# Patient Record
Sex: Female | Born: 1935 | Race: White | Hispanic: No | Smoking: Former smoker
Health system: Southern US, Community
[De-identification: ages and names within clinical notes are randomized; demographics above are authoritative.]

## PROBLEM LIST (undated history)

## (undated) ENCOUNTER — Emergency Department (HOSPITAL_COMMUNITY): Admission: EM | Payer: Medicare Other | Source: Home / Self Care

## (undated) DIAGNOSIS — M1990 Unspecified osteoarthritis, unspecified site: Secondary | ICD-10-CM

## (undated) DIAGNOSIS — I514 Myocarditis, unspecified: Secondary | ICD-10-CM

## (undated) DIAGNOSIS — F32A Depression, unspecified: Secondary | ICD-10-CM

## (undated) DIAGNOSIS — R0600 Dyspnea, unspecified: Secondary | ICD-10-CM

## (undated) DIAGNOSIS — E871 Hypo-osmolality and hyponatremia: Secondary | ICD-10-CM

## (undated) DIAGNOSIS — I471 Supraventricular tachycardia, unspecified: Secondary | ICD-10-CM

## (undated) DIAGNOSIS — R269 Unspecified abnormalities of gait and mobility: Principal | ICD-10-CM

## (undated) DIAGNOSIS — C801 Malignant (primary) neoplasm, unspecified: Secondary | ICD-10-CM

## (undated) DIAGNOSIS — N182 Chronic kidney disease, stage 2 (mild): Secondary | ICD-10-CM

## (undated) DIAGNOSIS — IMO0001 Reserved for inherently not codable concepts without codable children: Secondary | ICD-10-CM

## (undated) DIAGNOSIS — I319 Disease of pericardium, unspecified: Secondary | ICD-10-CM

## (undated) DIAGNOSIS — K219 Gastro-esophageal reflux disease without esophagitis: Secondary | ICD-10-CM

## (undated) DIAGNOSIS — I351 Nonrheumatic aortic (valve) insufficiency: Secondary | ICD-10-CM

## (undated) DIAGNOSIS — I10 Essential (primary) hypertension: Secondary | ICD-10-CM

## (undated) DIAGNOSIS — D649 Anemia, unspecified: Secondary | ICD-10-CM

## (undated) HISTORY — PX: VAGINAL HYSTERECTOMY: SUR661

## (undated) HISTORY — DX: Reserved for inherently not codable concepts without codable children: IMO0001

## (undated) HISTORY — DX: Nonrheumatic aortic (valve) insufficiency: I35.1

## (undated) HISTORY — PX: APPENDECTOMY: SHX54

## (undated) HISTORY — DX: Depression, unspecified: F32.A

## (undated) HISTORY — DX: Hypo-osmolality and hyponatremia: E87.1

## (undated) HISTORY — DX: Supraventricular tachycardia, unspecified: I47.10

## (undated) HISTORY — DX: Chronic kidney disease, stage 2 (mild): N18.2

## (undated) HISTORY — DX: Myocarditis, unspecified: I51.4

## (undated) HISTORY — DX: Supraventricular tachycardia: I47.1

## (undated) HISTORY — PX: REVISION TOTAL HIP ARTHROPLASTY: SHX766

## (undated) HISTORY — DX: Unspecified abnormalities of gait and mobility: R26.9

## (undated) HISTORY — PX: OOPHORECTOMY: SHX86

## (undated) HISTORY — DX: Essential (primary) hypertension: I10

## (undated) HISTORY — PX: PELVIC LAPAROSCOPY: SHX162

## (undated) HISTORY — PX: TOTAL KNEE ARTHROPLASTY: SHX125

## (undated) HISTORY — PX: JOINT REPLACEMENT: SHX530

---

## 1990-03-15 HISTORY — PX: BREAST EXCISIONAL BIOPSY: SUR124

## 1997-06-20 ENCOUNTER — Ambulatory Visit (HOSPITAL_COMMUNITY): Admission: RE | Admit: 1997-06-20 | Discharge: 1997-06-20 | Payer: Self-pay | Admitting: Gastroenterology

## 1997-08-19 ENCOUNTER — Other Ambulatory Visit: Admission: RE | Admit: 1997-08-19 | Discharge: 1997-08-19 | Payer: Self-pay | Admitting: *Deleted

## 1997-12-20 ENCOUNTER — Other Ambulatory Visit: Admission: RE | Admit: 1997-12-20 | Discharge: 1997-12-20 | Payer: Self-pay | Admitting: *Deleted

## 1998-06-18 ENCOUNTER — Emergency Department (HOSPITAL_COMMUNITY): Admission: EM | Admit: 1998-06-18 | Discharge: 1998-06-18 | Payer: Self-pay | Admitting: Emergency Medicine

## 1998-08-05 ENCOUNTER — Ambulatory Visit (HOSPITAL_COMMUNITY): Admission: RE | Admit: 1998-08-05 | Discharge: 1998-08-05 | Payer: Self-pay | Admitting: Interventional Cardiology

## 1998-08-05 ENCOUNTER — Encounter: Payer: Self-pay | Admitting: Interventional Cardiology

## 1998-12-22 ENCOUNTER — Other Ambulatory Visit: Admission: RE | Admit: 1998-12-22 | Discharge: 1998-12-22 | Payer: Self-pay | Admitting: *Deleted

## 1999-02-17 ENCOUNTER — Encounter: Payer: Self-pay | Admitting: *Deleted

## 1999-02-17 ENCOUNTER — Encounter: Admission: RE | Admit: 1999-02-17 | Discharge: 1999-02-17 | Payer: Self-pay | Admitting: *Deleted

## 1999-03-19 ENCOUNTER — Inpatient Hospital Stay (HOSPITAL_COMMUNITY): Admission: RE | Admit: 1999-03-19 | Discharge: 1999-03-20 | Payer: Self-pay | Admitting: *Deleted

## 1999-11-24 ENCOUNTER — Encounter: Admission: RE | Admit: 1999-11-24 | Discharge: 1999-11-24 | Payer: Self-pay | Admitting: Surgery

## 1999-11-24 ENCOUNTER — Encounter: Payer: Self-pay | Admitting: Surgery

## 2000-01-19 ENCOUNTER — Other Ambulatory Visit: Admission: RE | Admit: 2000-01-19 | Discharge: 2000-01-19 | Payer: Self-pay | Admitting: *Deleted

## 2000-03-22 ENCOUNTER — Encounter: Admission: RE | Admit: 2000-03-22 | Discharge: 2000-03-22 | Payer: Self-pay | Admitting: *Deleted

## 2000-03-22 ENCOUNTER — Encounter: Payer: Self-pay | Admitting: *Deleted

## 2000-03-23 ENCOUNTER — Encounter: Payer: Self-pay | Admitting: *Deleted

## 2000-03-23 ENCOUNTER — Encounter: Admission: RE | Admit: 2000-03-23 | Discharge: 2000-03-23 | Payer: Self-pay | Admitting: *Deleted

## 2001-03-23 ENCOUNTER — Encounter: Admission: RE | Admit: 2001-03-23 | Discharge: 2001-03-23 | Payer: Self-pay | Admitting: *Deleted

## 2001-03-23 ENCOUNTER — Encounter: Payer: Self-pay | Admitting: *Deleted

## 2001-11-28 ENCOUNTER — Encounter: Payer: Self-pay | Admitting: Surgery

## 2001-11-28 ENCOUNTER — Encounter: Admission: RE | Admit: 2001-11-28 | Discharge: 2001-11-28 | Payer: Self-pay | Admitting: Surgery

## 2001-12-04 ENCOUNTER — Inpatient Hospital Stay (HOSPITAL_COMMUNITY): Admission: RE | Admit: 2001-12-04 | Discharge: 2001-12-06 | Payer: Self-pay | Admitting: Orthopedic Surgery

## 2002-03-28 ENCOUNTER — Encounter: Admission: RE | Admit: 2002-03-28 | Discharge: 2002-03-28 | Payer: Self-pay | Admitting: Obstetrics and Gynecology

## 2002-03-28 ENCOUNTER — Encounter: Payer: Self-pay | Admitting: Obstetrics and Gynecology

## 2002-05-30 ENCOUNTER — Encounter: Admission: RE | Admit: 2002-05-30 | Discharge: 2002-05-30 | Payer: Self-pay | Admitting: Gastroenterology

## 2002-05-30 ENCOUNTER — Encounter: Payer: Self-pay | Admitting: Gastroenterology

## 2002-08-07 ENCOUNTER — Encounter (INDEPENDENT_AMBULATORY_CARE_PROVIDER_SITE_OTHER): Payer: Self-pay | Admitting: Specialist

## 2002-08-07 ENCOUNTER — Ambulatory Visit (HOSPITAL_BASED_OUTPATIENT_CLINIC_OR_DEPARTMENT_OTHER): Admission: RE | Admit: 2002-08-07 | Discharge: 2002-08-07 | Payer: Self-pay | Admitting: Orthopedic Surgery

## 2002-11-20 ENCOUNTER — Encounter: Admission: RE | Admit: 2002-11-20 | Discharge: 2002-11-20 | Payer: Self-pay | Admitting: Surgery

## 2002-11-20 ENCOUNTER — Encounter: Payer: Self-pay | Admitting: Surgery

## 2003-04-18 ENCOUNTER — Encounter: Admission: RE | Admit: 2003-04-18 | Discharge: 2003-04-18 | Payer: Self-pay | Admitting: Gastroenterology

## 2003-11-20 ENCOUNTER — Ambulatory Visit (HOSPITAL_COMMUNITY): Admission: RE | Admit: 2003-11-20 | Discharge: 2003-11-20 | Payer: Self-pay | Admitting: Surgery

## 2004-04-21 ENCOUNTER — Encounter: Admission: RE | Admit: 2004-04-21 | Discharge: 2004-04-21 | Payer: Self-pay | Admitting: Obstetrics and Gynecology

## 2004-04-29 ENCOUNTER — Emergency Department (HOSPITAL_COMMUNITY): Admission: EM | Admit: 2004-04-29 | Discharge: 2004-04-29 | Payer: Self-pay | Admitting: Family Medicine

## 2005-04-22 ENCOUNTER — Encounter: Admission: RE | Admit: 2005-04-22 | Discharge: 2005-04-22 | Payer: Self-pay | Admitting: Obstetrics and Gynecology

## 2005-10-20 ENCOUNTER — Encounter: Admission: RE | Admit: 2005-10-20 | Discharge: 2005-10-20 | Payer: Self-pay | Admitting: Gastroenterology

## 2005-11-26 ENCOUNTER — Encounter: Admission: RE | Admit: 2005-11-26 | Discharge: 2005-11-26 | Payer: Self-pay | Admitting: Surgery

## 2006-03-23 ENCOUNTER — Ambulatory Visit (HOSPITAL_COMMUNITY): Admission: RE | Admit: 2006-03-23 | Discharge: 2006-03-23 | Payer: Self-pay | Admitting: Orthopedic Surgery

## 2006-04-26 ENCOUNTER — Encounter: Admission: RE | Admit: 2006-04-26 | Discharge: 2006-04-26 | Payer: Self-pay | Admitting: Obstetrics and Gynecology

## 2006-07-13 ENCOUNTER — Encounter: Admission: RE | Admit: 2006-07-13 | Discharge: 2006-07-13 | Payer: Self-pay | Admitting: Gastroenterology

## 2007-05-01 ENCOUNTER — Encounter: Admission: RE | Admit: 2007-05-01 | Discharge: 2007-05-01 | Payer: Self-pay | Admitting: Obstetrics and Gynecology

## 2007-08-10 ENCOUNTER — Encounter: Admission: RE | Admit: 2007-08-10 | Discharge: 2007-08-10 | Payer: Self-pay | Admitting: Gastroenterology

## 2007-10-19 ENCOUNTER — Ambulatory Visit: Payer: Self-pay | Admitting: Oncology

## 2007-10-26 LAB — CBC & DIFF AND RETIC
BASO%: 1.3 % (ref 0.0–2.0)
Basophils Absolute: 0.1 10*3/uL (ref 0.0–0.1)
EOS%: 9.4 % — ABNORMAL HIGH (ref 0.0–7.0)
Eosinophils Absolute: 0.6 10*3/uL — ABNORMAL HIGH (ref 0.0–0.5)
HCT: 30.9 % — ABNORMAL LOW (ref 34.8–46.6)
HGB: 10.5 g/dL — ABNORMAL LOW (ref 11.6–15.9)
IRF: 0.34 — ABNORMAL HIGH (ref 0.130–0.330)
LYMPH%: 31.1 % (ref 14.0–48.0)
MCH: 30.6 pg (ref 26.0–34.0)
MCHC: 34 g/dL (ref 32.0–36.0)
MCV: 89.9 fL (ref 81.0–101.0)
MONO#: 0.6 10*3/uL (ref 0.1–0.9)
MONO%: 8.9 % (ref 0.0–13.0)
NEUT#: 3.3 10*3/uL (ref 1.5–6.5)
NEUT%: 49.3 % (ref 39.6–76.8)
Platelets: 207 10*3/uL (ref 145–400)
RBC: 3.44 10*6/uL — ABNORMAL LOW (ref 3.70–5.32)
RDW: 13.8 % (ref 11.3–14.5)
RETIC #: 34.1 10*3/uL (ref 19.7–115.1)
Retic %: 1 % (ref 0.4–2.3)
WBC: 6.8 10*3/uL (ref 3.9–10.0)
lymph#: 2.1 10*3/uL (ref 0.9–3.3)

## 2007-10-26 LAB — CHCC SMEAR

## 2007-10-30 LAB — IRON AND TIBC
%SAT: 22 % (ref 20–55)
Iron: 79 ug/dL (ref 42–145)
TIBC: 364 ug/dL (ref 250–470)
UIBC: 285 ug/dL

## 2007-10-30 LAB — COMPREHENSIVE METABOLIC PANEL
ALT: 24 U/L (ref 0–35)
AST: 27 U/L (ref 0–37)
Albumin: 4.1 g/dL (ref 3.5–5.2)
Alkaline Phosphatase: 55 U/L (ref 39–117)
BUN: 47 mg/dL — ABNORMAL HIGH (ref 6–23)
CO2: 20 mEq/L (ref 19–32)
Calcium: 8.8 mg/dL (ref 8.4–10.5)
Chloride: 105 mEq/L (ref 96–112)
Creatinine, Ser: 1.38 mg/dL — ABNORMAL HIGH (ref 0.40–1.20)
Glucose, Bld: 96 mg/dL (ref 70–99)
Potassium: 5 mEq/L (ref 3.5–5.3)
Sodium: 136 mEq/L (ref 135–145)
Total Bilirubin: 0.3 mg/dL (ref 0.3–1.2)
Total Protein: 7.3 g/dL (ref 6.0–8.3)

## 2007-10-30 LAB — FERRITIN: Ferritin: 50 ng/mL (ref 10–291)

## 2007-10-30 LAB — TRANSFERRIN RECEPTOR, SOLUABLE: Transferrin Receptor, Soluble: 23.4 nmol/L

## 2007-10-30 LAB — LACTATE DEHYDROGENASE: LDH: 196 U/L (ref 94–250)

## 2007-10-30 LAB — FOLATE RBC: RBC Folate: 2182 ng/mL — ABNORMAL HIGH (ref 180–600)

## 2007-10-30 LAB — ERYTHROPOIETIN: Erythropoietin: 9.2 m[IU]/mL (ref 2.6–34.0)

## 2007-10-30 LAB — IMMUNOFIXATION ELECTROPHORESIS
IgA: 185 mg/dL (ref 68–378)
IgG (Immunoglobin G), Serum: 1140 mg/dL (ref 694–1618)
IgM, Serum: 84 mg/dL (ref 60–263)
Total Protein, Serum Electrophoresis: 7.3 g/dL (ref 6.0–8.3)

## 2007-10-30 LAB — SEDIMENTATION RATE: Sed Rate: 14 mm/hr (ref 0–22)

## 2007-10-30 LAB — VITAMIN B12: Vitamin B-12: 1171 pg/mL — ABNORMAL HIGH (ref 211–911)

## 2007-11-01 LAB — CBC WITH DIFFERENTIAL/PLATELET
BASO%: 1.6 % (ref 0.0–2.0)
Basophils Absolute: 0.1 10*3/uL (ref 0.0–0.1)
EOS%: 13.3 % — ABNORMAL HIGH (ref 0.0–7.0)
Eosinophils Absolute: 0.8 10*3/uL — ABNORMAL HIGH (ref 0.0–0.5)
HCT: 32.2 % — ABNORMAL LOW (ref 34.8–46.6)
HGB: 10.9 g/dL — ABNORMAL LOW (ref 11.6–15.9)
LYMPH%: 35.4 % (ref 14.0–48.0)
MCH: 30.6 pg (ref 26.0–34.0)
MCHC: 33.8 g/dL (ref 32.0–36.0)
MCV: 90.3 fL (ref 81.0–101.0)
MONO#: 0.5 10*3/uL (ref 0.1–0.9)
MONO%: 9.2 % (ref 0.0–13.0)
NEUT#: 2.3 10*3/uL (ref 1.5–6.5)
NEUT%: 40.5 % (ref 39.6–76.8)
Platelets: 215 10*3/uL (ref 145–400)
RBC: 3.57 10*6/uL — ABNORMAL LOW (ref 3.70–5.32)
RDW: 13.7 % (ref 11.3–14.5)
WBC: 5.7 10*3/uL (ref 3.9–10.0)
lymph#: 2 10*3/uL (ref 0.9–3.3)

## 2007-11-10 ENCOUNTER — Encounter: Admission: RE | Admit: 2007-11-10 | Discharge: 2007-11-10 | Payer: Self-pay | Admitting: Surgery

## 2007-11-15 LAB — CBC WITH DIFFERENTIAL/PLATELET
BASO%: 1.1 % (ref 0.0–2.0)
Basophils Absolute: 0.1 10*3/uL (ref 0.0–0.1)
EOS%: 8.6 % — ABNORMAL HIGH (ref 0.0–7.0)
Eosinophils Absolute: 0.5 10*3/uL (ref 0.0–0.5)
HCT: 34 % — ABNORMAL LOW (ref 34.8–46.6)
HGB: 11.5 g/dL — ABNORMAL LOW (ref 11.6–15.9)
LYMPH%: 25.1 % (ref 14.0–48.0)
MCH: 29.8 pg (ref 26.0–34.0)
MCHC: 33.7 g/dL (ref 32.0–36.0)
MCV: 88.3 fL (ref 81.0–101.0)
MONO#: 0.5 10*3/uL (ref 0.1–0.9)
MONO%: 8.6 % (ref 0.0–13.0)
NEUT#: 3.2 10*3/uL (ref 1.5–6.5)
NEUT%: 56.6 % (ref 39.6–76.8)
Platelets: 221 10*3/uL (ref 145–400)
RBC: 3.86 10*6/uL (ref 3.70–5.32)
RDW: 13.9 % (ref 11.3–14.5)
WBC: 5.6 10*3/uL (ref 3.9–10.0)
lymph#: 1.4 10*3/uL (ref 0.9–3.3)

## 2007-12-01 ENCOUNTER — Inpatient Hospital Stay (HOSPITAL_COMMUNITY): Admission: RE | Admit: 2007-12-01 | Discharge: 2007-12-04 | Payer: Self-pay | Admitting: Orthopedic Surgery

## 2007-12-06 ENCOUNTER — Ambulatory Visit: Payer: Self-pay | Admitting: Oncology

## 2007-12-13 LAB — CBC WITH DIFFERENTIAL/PLATELET
BASO%: 0.7 % (ref 0.0–2.0)
Basophils Absolute: 0.1 10*3/uL (ref 0.0–0.1)
EOS%: 7.2 % — ABNORMAL HIGH (ref 0.0–7.0)
Eosinophils Absolute: 0.7 10*3/uL — ABNORMAL HIGH (ref 0.0–0.5)
HCT: 30.5 % — ABNORMAL LOW (ref 34.8–46.6)
HGB: 10.2 g/dL — ABNORMAL LOW (ref 11.6–15.9)
LYMPH%: 15 % (ref 14.0–48.0)
MCH: 29.2 pg (ref 26.0–34.0)
MCHC: 33.5 g/dL (ref 32.0–36.0)
MCV: 87.1 fL (ref 81.0–101.0)
MONO#: 0.8 10*3/uL (ref 0.1–0.9)
MONO%: 8.4 % (ref 0.0–13.0)
NEUT#: 6.6 10*3/uL — ABNORMAL HIGH (ref 1.5–6.5)
NEUT%: 68.7 % (ref 39.6–76.8)
Platelets: 444 10*3/uL — ABNORMAL HIGH (ref 145–400)
RBC: 3.5 10*6/uL — ABNORMAL LOW (ref 3.70–5.32)
RDW: 13.9 % (ref 11.3–14.5)
WBC: 9.5 10*3/uL (ref 3.9–10.0)
lymph#: 1.4 10*3/uL (ref 0.9–3.3)

## 2007-12-26 LAB — CBC WITH DIFFERENTIAL/PLATELET
BASO%: 1 % (ref 0.0–2.0)
Basophils Absolute: 0.1 10*3/uL (ref 0.0–0.1)
EOS%: 8.2 % — ABNORMAL HIGH (ref 0.0–7.0)
Eosinophils Absolute: 0.6 10*3/uL — ABNORMAL HIGH (ref 0.0–0.5)
HCT: 33.4 % — ABNORMAL LOW (ref 34.8–46.6)
HGB: 10.8 g/dL — ABNORMAL LOW (ref 11.6–15.9)
LYMPH%: 32.2 % (ref 14.0–48.0)
MCH: 28.7 pg (ref 26.0–34.0)
MCHC: 32.4 g/dL (ref 32.0–36.0)
MCV: 88.5 fL (ref 81.0–101.0)
MONO#: 0.8 10*3/uL (ref 0.1–0.9)
MONO%: 12.1 % (ref 0.0–13.0)
NEUT#: 3.1 10*3/uL (ref 1.5–6.5)
NEUT%: 46.5 % (ref 39.6–76.8)
Platelets: 314 10*3/uL (ref 145–400)
RBC: 3.78 10*6/uL (ref 3.70–5.32)
RDW: 15.3 % — ABNORMAL HIGH (ref 11.3–14.5)
WBC: 6.8 10*3/uL (ref 3.9–10.0)
lymph#: 2.2 10*3/uL (ref 0.9–3.3)

## 2007-12-30 ENCOUNTER — Emergency Department (HOSPITAL_COMMUNITY): Admission: EM | Admit: 2007-12-30 | Discharge: 2007-12-30 | Payer: Self-pay | Admitting: Emergency Medicine

## 2008-01-05 ENCOUNTER — Inpatient Hospital Stay (HOSPITAL_COMMUNITY): Admission: AD | Admit: 2008-01-05 | Discharge: 2008-01-09 | Payer: Self-pay | Admitting: Gastroenterology

## 2008-01-05 ENCOUNTER — Encounter: Admission: RE | Admit: 2008-01-05 | Discharge: 2008-01-05 | Payer: Self-pay | Admitting: Gastroenterology

## 2008-01-14 ENCOUNTER — Ambulatory Visit: Payer: Self-pay | Admitting: Cardiology

## 2008-01-14 ENCOUNTER — Inpatient Hospital Stay (HOSPITAL_COMMUNITY): Admission: EM | Admit: 2008-01-14 | Discharge: 2008-01-15 | Payer: Self-pay | Admitting: Family Medicine

## 2008-05-09 ENCOUNTER — Encounter: Admission: RE | Admit: 2008-05-09 | Discharge: 2008-05-09 | Payer: Self-pay | Admitting: Obstetrics and Gynecology

## 2009-01-04 ENCOUNTER — Encounter: Admission: RE | Admit: 2009-01-04 | Discharge: 2009-01-04 | Payer: Self-pay | Admitting: Orthopedic Surgery

## 2009-02-26 ENCOUNTER — Inpatient Hospital Stay (HOSPITAL_COMMUNITY): Admission: RE | Admit: 2009-02-26 | Discharge: 2009-03-02 | Payer: Self-pay | Admitting: Orthopedic Surgery

## 2009-03-29 ENCOUNTER — Inpatient Hospital Stay (HOSPITAL_COMMUNITY): Admission: EM | Admit: 2009-03-29 | Discharge: 2009-03-30 | Payer: Self-pay | Admitting: Emergency Medicine

## 2009-04-07 ENCOUNTER — Observation Stay (HOSPITAL_COMMUNITY): Admission: EM | Admit: 2009-04-07 | Discharge: 2009-04-08 | Payer: Self-pay | Admitting: Emergency Medicine

## 2009-04-14 ENCOUNTER — Inpatient Hospital Stay (HOSPITAL_COMMUNITY): Admission: RE | Admit: 2009-04-14 | Discharge: 2009-04-17 | Payer: Self-pay | Admitting: Orthopedic Surgery

## 2009-05-15 ENCOUNTER — Encounter: Admission: RE | Admit: 2009-05-15 | Discharge: 2009-05-15 | Payer: Self-pay | Admitting: Orthopedic Surgery

## 2009-05-27 ENCOUNTER — Ambulatory Visit (HOSPITAL_BASED_OUTPATIENT_CLINIC_OR_DEPARTMENT_OTHER): Admission: RE | Admit: 2009-05-27 | Discharge: 2009-05-27 | Payer: Self-pay | Admitting: Orthopedic Surgery

## 2009-06-10 ENCOUNTER — Encounter: Admission: RE | Admit: 2009-06-10 | Discharge: 2009-06-10 | Payer: Self-pay | Admitting: Obstetrics and Gynecology

## 2009-09-19 IMAGING — CT CT ANGIO CHEST
2 of 7 series · 19 of 36 positions shown · IV contrast (APPLIED)
Comparison: 11/20/2002

CLINICAL DATA: Chest pain and shortness of breath

CT ANGIOGRAPHY CHEST
TECHNIQUE: Multidetector CT imaging of the chest using the
standard protocol during bolus administration of intravenous
contrast. Multiplanar reconstructed images including MIPs were
obtained and reviewed to evaluate the vascular anatomy.
Contrast: 80 ml [id number]

[Series 8: pulm embolism 1.0 b25f thins · axial · 0.64mm/px · z∈[-169,+112]mm · 17 of 313 slices shown]
[im 16/313  lung]
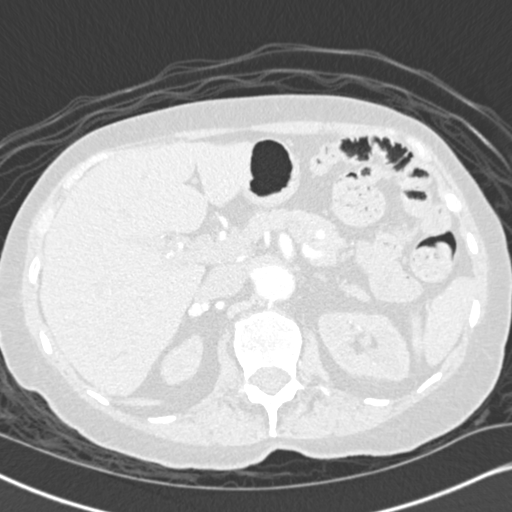
[im 32/313  mediastinal]
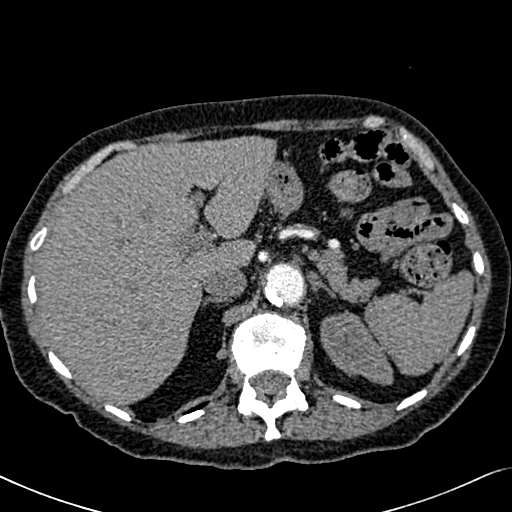
[im 47/313  lung]
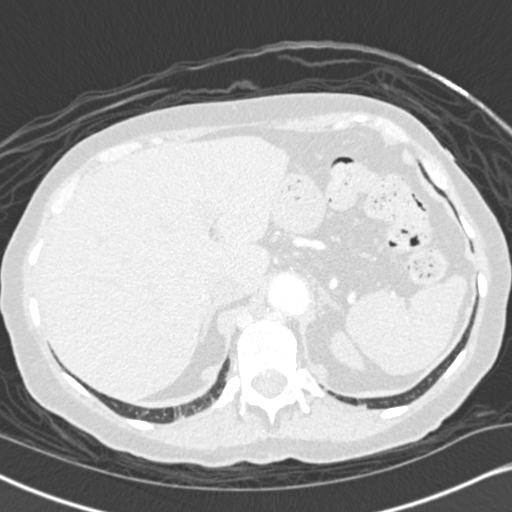
[im 63/313  mediastinal]
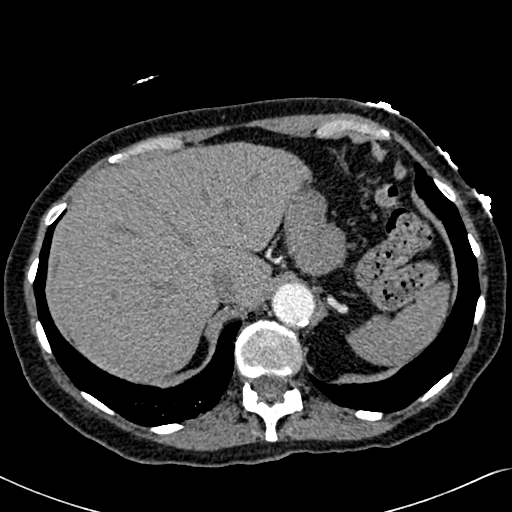
[im 94/313  lung]
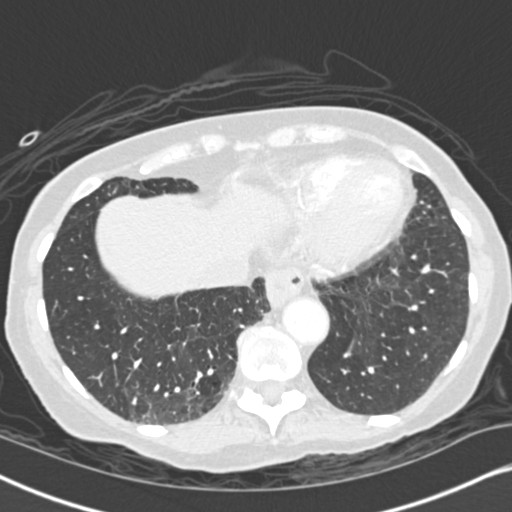
[im 110/313  mediastinal]
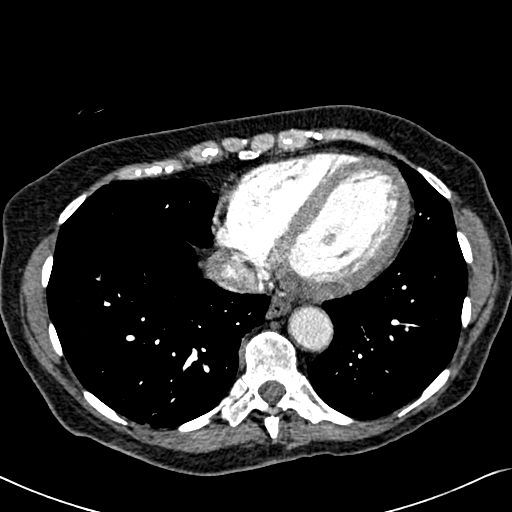
[im 125/313  lung]
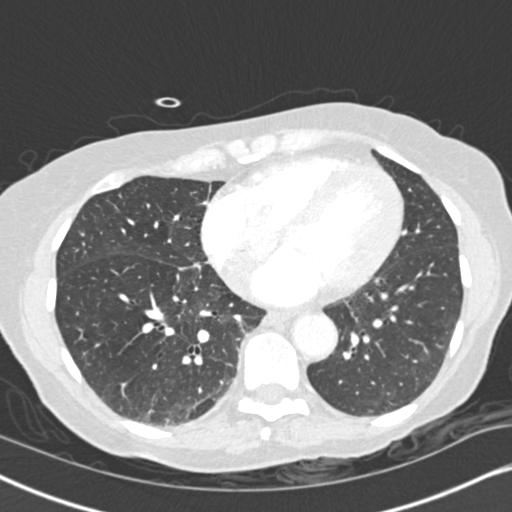
[im 141/313  mediastinal]
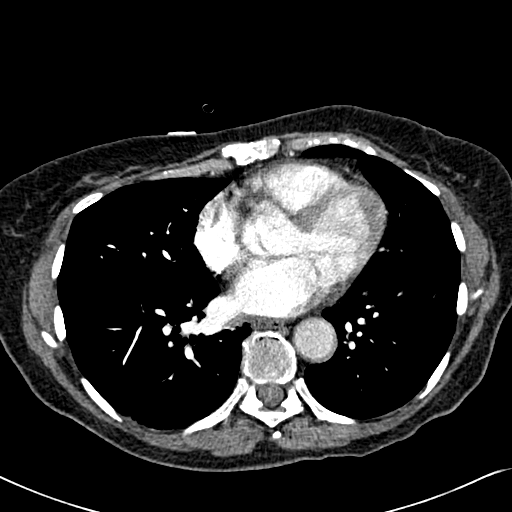
[im 157/313  lung]
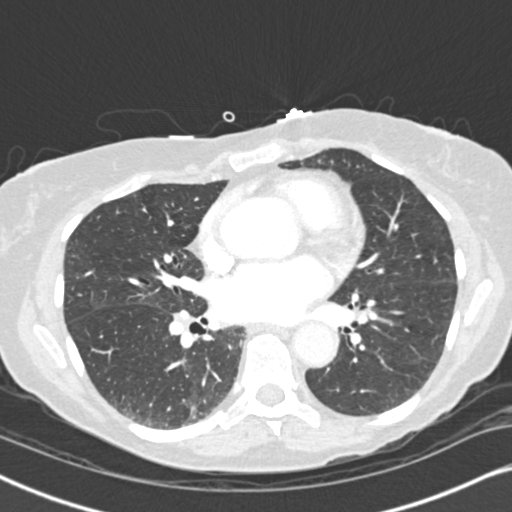
[im 172/313  mediastinal]
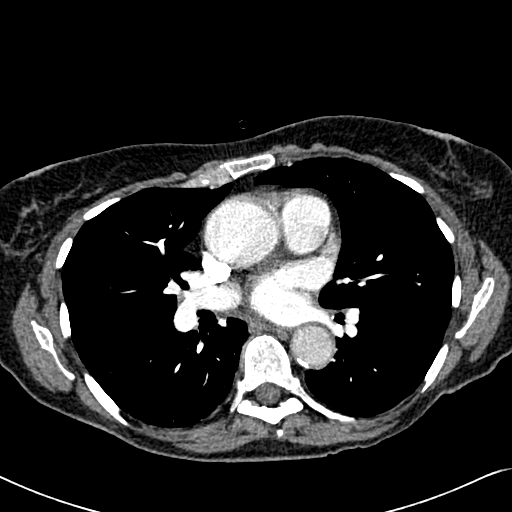
[im 188/313  lung]
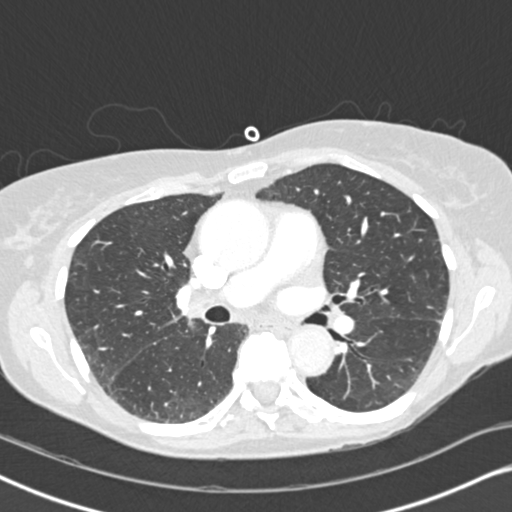
[im 203/313  mediastinal]
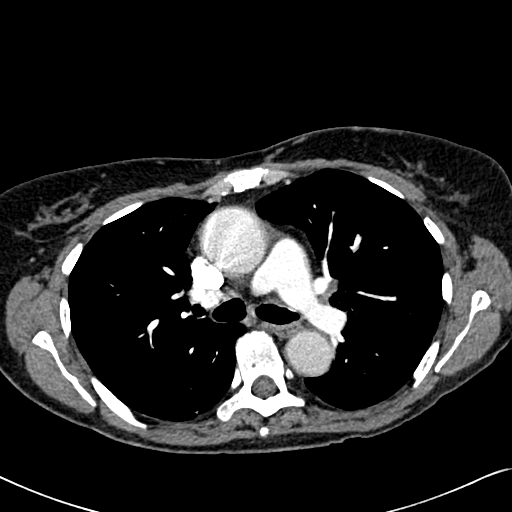
[im 219/313  lung]
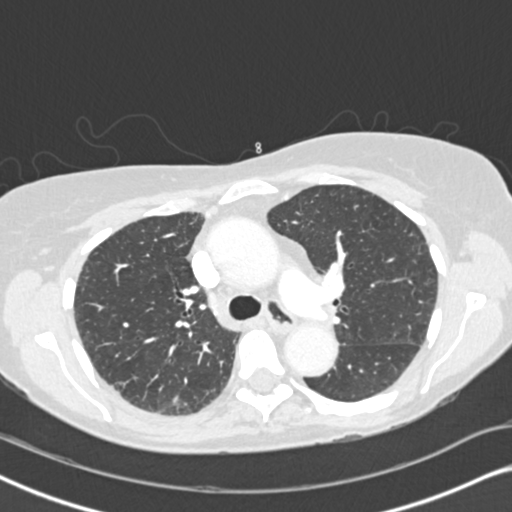
[im 250/313  mediastinal]
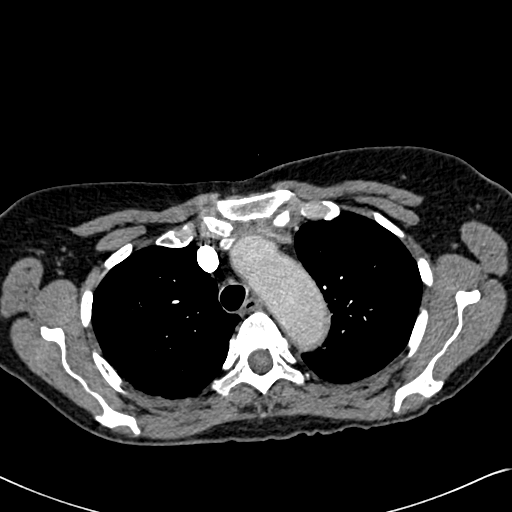
[im 266/313  lung]
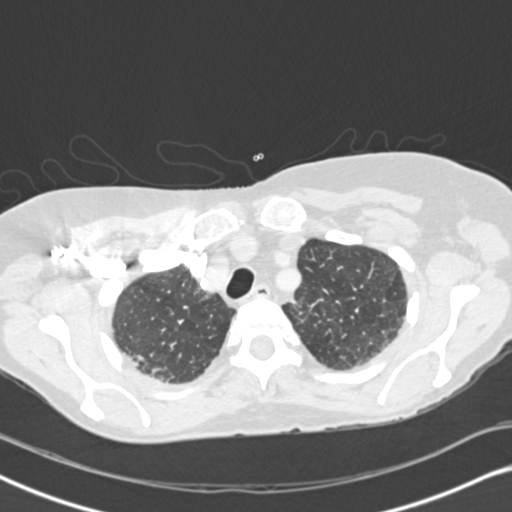
[im 281/313  mediastinal]
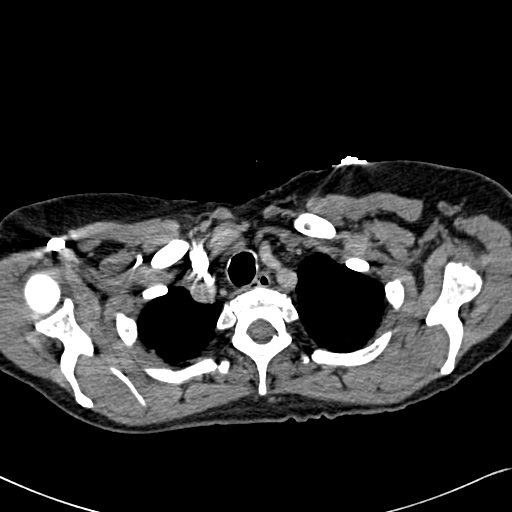
[im 297/313  lung]
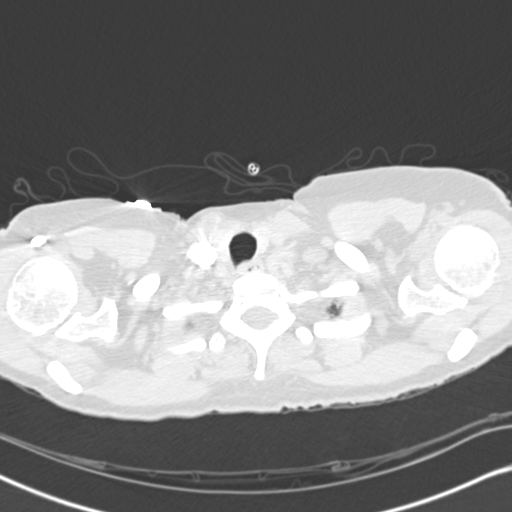

[Series 9: pulm embolism 2.0 spo thins · coronal · 0.69mm/px · 2 of 103 slices shown]
[im 35/103  mediastinal]
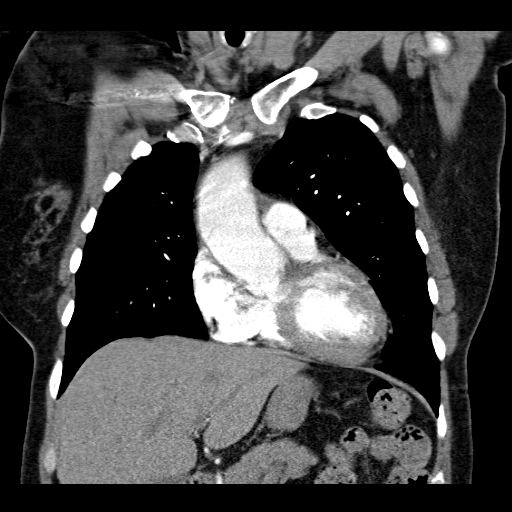
[im 69/103  mediastinal]
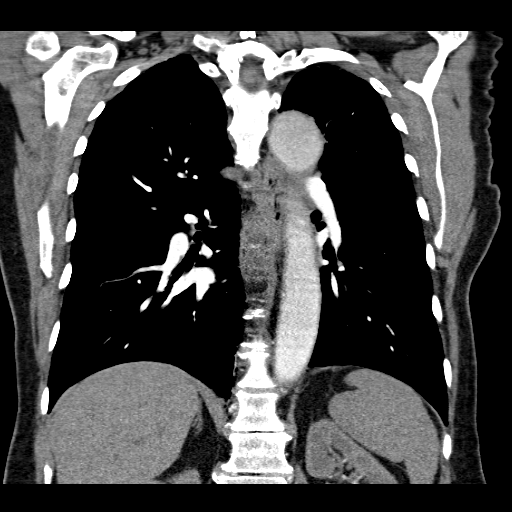

[19 of 36 positions shown; findings below may reference images not displayed]

FINDINGS: The chest wall is unremarkable.  No obvious breast
masses, supraclavicular or axillary adenopathy.  Degenerative
changes throughout the thoracic spine.

The heart is normal in size.  No pericardial effusion.  No
mediastinal or hilar adenopathy.  There is fusiform aneurysmal
dilatation of the ascending aorta with maximal measurements of
x 4.5 cm.  This measured with 4.4 x 4.1 cm on the prior CT scan.
No dissection.  The descending thoracic aorta demonstrates
atherosclerotic calcifications.  The esophagus is grossly normal.
There is a hiatal hernia.

The pulmonary arterial tree is fairly well opacified.  No filling
defects are seen to suggest pulmonary emboli.

Examination of the lung parenchyma demonstrates areas of subpleural
atelectasis.  No infiltrates, edema or effusions.  No worrisome
masses or nodules.

The upper abdomen is unremarkable.
IMPRESSION: 1.  No CT findings for pulmonary embolism.
2.  Fusiform aneurysmal dilatation of the ascending aorta as
discussed above.
3.  Hiatal hernia and mild distal esophageal wall thickening.
4.  No acute pulmonary findings.  There are mild chronic lung
changes and areas of dependent subpleural atelectasis.

## 2009-12-09 ENCOUNTER — Inpatient Hospital Stay (HOSPITAL_COMMUNITY): Admission: RE | Admit: 2009-12-09 | Discharge: 2009-12-13 | Payer: Self-pay | Admitting: Orthopedic Surgery

## 2010-01-06 ENCOUNTER — Ambulatory Visit: Payer: Self-pay | Admitting: Surgery

## 2010-01-06 ENCOUNTER — Encounter: Admission: RE | Admit: 2010-01-06 | Discharge: 2010-01-06 | Payer: Self-pay | Admitting: Surgery

## 2010-04-05 ENCOUNTER — Encounter: Payer: Self-pay | Admitting: Orthopedic Surgery

## 2010-04-15 NOTE — H&P (Signed)
  [name], [name]             ACCOUNT NO.:  [id number]  MEDICAL RECORD NO.:  [id number]           PATIENT TYPE:  LOCATION:                                 FACILITY:  PHYSICIAN:  Ollen Gross, M.D.    DATE OF BIRTH:  [birth year]  DATE OF ADMISSION:  04/14/2009 DATE OF DISCHARGE:                             HISTORY & PHYSICAL   CHIEF COMPLAINT:  Unstable left hip.  HISTORY OF PRESENT ILLNESS:  The patient is 77 year old female with a left total hip done approximately 6 or 7 weeks ago and unfortunately had a posterior dislocation, had to have a closed reduction of the second traumatic dislocation, felt to be some impingement, and I felt at that point she need to be revised, have revision acetabulum.  ALLERGIES:  SULFA, CIPRO, KEFLEX SHELLFISH.  CURRENT MEDICATIONS:  Carvedilol, Diovan, Mobic, hormone replacement, diltiazem, vitamin C, vitamin D, glucosamine chondroitin, multivitamin, vitamin D, fish oil, vitamin E, calcium, aspirin, hydralazine, primrose oil, and PreserVision.  PAST MEDICAL HISTORY:  Macular degeneration, cataracts, history bronchitis, hypertension, heart murmur, AFib, irritable bowel syndrome, slight urinary incontinence, history of blood transfusions, degenerative disk disease, history of bursitis, postmenopausal.  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  FAMILY HISTORY:  Father deceased age 69 with heart attack.  Mother deceased at age 58 complications of stroke.  SOCIAL HISTORY:  Married.  Lives at home, retired.  REVIEW OF SYSTEMS:  GENERAL:  No fevers, chills or night sweats.  NEURO: No seizure, syncope, or paralysis.  RESPIRATORY:  No shortness breath, productive cough  or hemoptysis.  CARDIOVASCULAR:  No chest pain, no orthopnea.  GI:  No nausea, vomiting, diarrhea, constipation.  GU:  No dysuria, hematuria or discharge.  MUSCULOSKELETAL:  Left hip.  PHYSICAL EXAMINATION:  VITAL SIGNS:  Temperature 97.3, pulse 80, respirations 18, blood pressure 148/73. GENERAL:  A 77 year old female, well-developed, in no acute distress. Alert, oriented, cooperative. HEENT:  Normocephalic, atraumatic.  Pupils equal, round, and reactive. EOMs intact. NECK:  Supple. CHEST:  Clear. HEART:  Regular rate and rhythm.  S1, S2 noted. ABDOMEN:  Soft, nontender.  Bowel sounds present. RECTAL/BREASTS/GENITALIA:  Not done, not pertinent to present illness. EXTREMITIES:  Left hip flexion 100, internal rotation about 25, external rotation about 30, abduction 30.  IMPRESSION:  Unstable left total hip.  PLAN:  The patient admitted to [facility] to undergo acetabular revision of the left hip.     [name], P.A.C.   ______________________________ Ollen Gross, M.D. ALP/MEDQ  D:  03/26/2010  T:  03/26/2010  Job:  [id number]  Electronically Signed by Patrica Duel P.A.C. on 04/15/2010 04:54:09 PM Electronically Signed by Ollen Gross M.D. on 04/15/2010 03:38:55 PM

## 2010-05-28 LAB — CBC
HCT: 24.1 % — ABNORMAL LOW (ref 36.0–46.0)
HCT: 28.2 % — ABNORMAL LOW (ref 36.0–46.0)
HCT: 28.4 % — ABNORMAL LOW (ref 36.0–46.0)
HCT: 30.8 % — ABNORMAL LOW (ref 36.0–46.0)
Hemoglobin: 10.4 g/dL — ABNORMAL LOW (ref 12.0–15.0)
Hemoglobin: 8.3 g/dL — ABNORMAL LOW (ref 12.0–15.0)
Hemoglobin: 9.5 g/dL — ABNORMAL LOW (ref 12.0–15.0)
Hemoglobin: 9.7 g/dL — ABNORMAL LOW (ref 12.0–15.0)
MCH: 30.4 pg (ref 26.0–34.0)
MCH: 30.6 pg (ref 26.0–34.0)
MCH: 30.8 pg (ref 26.0–34.0)
MCH: 30.9 pg (ref 26.0–34.0)
MCHC: 33.8 g/dL (ref 30.0–36.0)
MCHC: 34 g/dL (ref 30.0–36.0)
MCHC: 34.3 g/dL (ref 30.0–36.0)
MCHC: 34.5 g/dL (ref 30.0–36.0)
MCV: 89.5 fL (ref 78.0–100.0)
MCV: 89.8 fL (ref 78.0–100.0)
MCV: 89.9 fL (ref 78.0–100.0)
MCV: 90.2 fL (ref 78.0–100.0)
Platelets: 135 10*3/uL — ABNORMAL LOW (ref 150–400)
Platelets: 144 10*3/uL — ABNORMAL LOW (ref 150–400)
Platelets: 146 10*3/uL — ABNORMAL LOW (ref 150–400)
Platelets: 208 10*3/uL (ref 150–400)
RBC: 2.7 MIL/uL — ABNORMAL LOW (ref 3.87–5.11)
RBC: 3.14 MIL/uL — ABNORMAL LOW (ref 3.87–5.11)
RBC: 3.16 MIL/uL — ABNORMAL LOW (ref 3.87–5.11)
RBC: 3.41 MIL/uL — ABNORMAL LOW (ref 3.87–5.11)
RDW: 12.8 % (ref 11.5–15.5)
RDW: 13 % (ref 11.5–15.5)
RDW: 13.3 % (ref 11.5–15.5)
RDW: 13.4 % (ref 11.5–15.5)
WBC: 5.4 10*3/uL (ref 4.0–10.5)
WBC: 6.2 10*3/uL (ref 4.0–10.5)
WBC: 6.3 10*3/uL (ref 4.0–10.5)
WBC: 6.4 10*3/uL (ref 4.0–10.5)

## 2010-05-28 LAB — BASIC METABOLIC PANEL
BUN: 15 mg/dL (ref 6–23)
BUN: 20 mg/dL (ref 6–23)
CO2: 23 mEq/L (ref 19–32)
CO2: 26 mEq/L (ref 19–32)
Calcium: 7.8 mg/dL — ABNORMAL LOW (ref 8.4–10.5)
Calcium: 7.9 mg/dL — ABNORMAL LOW (ref 8.4–10.5)
Chloride: 103 mEq/L (ref 96–112)
Chloride: 104 mEq/L (ref 96–112)
Creatinine, Ser: 0.98 mg/dL (ref 0.4–1.2)
Creatinine, Ser: 1.07 mg/dL (ref 0.4–1.2)
GFR calc Af Amer: >60 mL/min (ref >60)
GFR calc Af Amer: >60 mL/min (ref >60)
GFR calc non Af Amer: 50 mL/min — ABNORMAL LOW (ref >60)
GFR calc non Af Amer: 55 mL/min — ABNORMAL LOW (ref >60)
Glucose, Bld: 102 mg/dL — ABNORMAL HIGH (ref 70–99)
Glucose, Bld: 125 mg/dL — ABNORMAL HIGH (ref 70–99)
Potassium: 3.8 mEq/L (ref 3.5–5.1)
Potassium: 3.9 mEq/L (ref 3.5–5.1)
Sodium: 133 mEq/L — ABNORMAL LOW (ref 135–145)
Sodium: 135 mEq/L (ref 135–145)

## 2010-05-28 LAB — PROTIME-INR
INR: 1.45 (ref 0.00–1.49)
INR: 1.05 (ref 0.00–1.49)
INR: 1.19 (ref 0.00–1.49)
INR: 1.37 (ref 0.00–1.49)
INR: 1.52 — ABNORMAL HIGH (ref 0.00–1.49)
Prothrombin Time: 17.8 seconds — ABNORMAL HIGH (ref 11.6–15.2)
Prothrombin Time: 13.9 seconds (ref 11.6–15.2)
Prothrombin Time: 15.3 seconds — ABNORMAL HIGH (ref 11.6–15.2)
Prothrombin Time: 17.1 seconds — ABNORMAL HIGH (ref 11.6–15.2)
Prothrombin Time: 18.5 seconds — ABNORMAL HIGH (ref 11.6–15.2)

## 2010-05-28 LAB — URINALYSIS, ROUTINE W REFLEX MICROSCOPIC
Bilirubin Urine: NEGATIVE
Glucose, UA: NEGATIVE mg/dL
Hgb urine dipstick: NEGATIVE
Ketones, ur: NEGATIVE mg/dL
Nitrite: NEGATIVE
Protein, ur: NEGATIVE mg/dL
Specific Gravity, Urine: 1.016 (ref 1.005–1.030)
Urobilinogen, UA: 0.2 mg/dL (ref 0.0–1.0)
pH: 5 (ref 5.0–8.0)

## 2010-05-28 LAB — TYPE AND SCREEN
ABO/RH(D): AB POS
Antibody Screen: NEGATIVE

## 2010-05-28 LAB — COMPREHENSIVE METABOLIC PANEL
ALT: 21 U/L (ref 0–35)
AST: 28 U/L (ref 0–37)
Albumin: 3.7 g/dL (ref 3.5–5.2)
Alkaline Phosphatase: 60 U/L (ref 39–117)
BUN: 45 mg/dL — ABNORMAL HIGH (ref 6–23)
CO2: 26 mEq/L (ref 19–32)
Calcium: 8.9 mg/dL (ref 8.4–10.5)
Chloride: 104 mEq/L (ref 96–112)
Creatinine, Ser: 1.08 mg/dL (ref 0.4–1.2)
GFR calc Af Amer: 60 mL/min — ABNORMAL LOW (ref >60)
GFR calc non Af Amer: 49 mL/min — ABNORMAL LOW (ref >60)
Glucose, Bld: 143 mg/dL — ABNORMAL HIGH (ref 70–99)
Potassium: 4.7 mEq/L (ref 3.5–5.1)
Sodium: 137 mEq/L (ref 135–145)
Total Bilirubin: 0.4 mg/dL (ref 0.3–1.2)
Total Protein: 7.1 g/dL (ref 6.0–8.3)

## 2010-05-28 LAB — SURGICAL PCR SCREEN
MRSA, PCR: NEGATIVE
Staphylococcus aureus: POSITIVE — AB

## 2010-05-28 LAB — APTT: aPTT: 30 seconds (ref 24–37)

## 2010-05-28 LAB — PREPARE RBC (CROSSMATCH)

## 2010-05-31 LAB — POCT I-STAT, CHEM 8
BUN: 37 mg/dL — ABNORMAL HIGH (ref 6–23)
Calcium, Ion: 1.11 mmol/L — ABNORMAL LOW (ref 1.12–1.32)
Chloride: 107 mEq/L (ref 96–112)
Creatinine, Ser: 1.2 mg/dL (ref 0.4–1.2)
Glucose, Bld: 103 mg/dL — ABNORMAL HIGH (ref 70–99)
HCT: 27 % — ABNORMAL LOW (ref 36.0–46.0)
Hemoglobin: 9.2 g/dL — ABNORMAL LOW (ref 12.0–15.0)
Potassium: 4.2 mEq/L (ref 3.5–5.1)
Sodium: 136 mEq/L (ref 135–145)
TCO2: 22 mmol/L (ref 0–100)

## 2010-06-01 LAB — CBC
HCT: 25.8 % — ABNORMAL LOW (ref 36.0–46.0)
Hemoglobin: 8.7 g/dL — ABNORMAL LOW (ref 12.0–15.0)
MCHC: 33.7 g/dL (ref 30.0–36.0)
MCV: 90.7 fL (ref 78.0–100.0)
Platelets: 230 10*3/uL (ref 150–400)
RBC: 2.84 MIL/uL — ABNORMAL LOW (ref 3.87–5.11)
RDW: 13.9 % (ref 11.5–15.5)
WBC: 5.8 10*3/uL (ref 4.0–10.5)

## 2010-06-01 LAB — TYPE AND SCREEN
ABO/RH(D): AB POS
Antibody Screen: NEGATIVE

## 2010-06-01 LAB — COMPREHENSIVE METABOLIC PANEL
ALT: 18 U/L (ref 0–35)
AST: 19 U/L (ref 0–37)
Albumin: 3.3 g/dL — ABNORMAL LOW (ref 3.5–5.2)
Alkaline Phosphatase: 57 U/L (ref 39–117)
BUN: 29 mg/dL — ABNORMAL HIGH (ref 6–23)
CO2: 24 mEq/L (ref 19–32)
Calcium: 8.7 mg/dL (ref 8.4–10.5)
Chloride: 99 mEq/L (ref 96–112)
Creatinine, Ser: 1.03 mg/dL (ref 0.4–1.2)
GFR calc Af Amer: >60 mL/min (ref >60)
GFR calc non Af Amer: 52 mL/min — ABNORMAL LOW (ref >60)
Glucose, Bld: 86 mg/dL (ref 70–99)
Potassium: 4.1 mEq/L (ref 3.5–5.1)
Sodium: 133 mEq/L — ABNORMAL LOW (ref 135–145)
Total Bilirubin: 0.8 mg/dL (ref 0.3–1.2)
Total Protein: 7.1 g/dL (ref 6.0–8.3)

## 2010-06-01 LAB — URINALYSIS, ROUTINE W REFLEX MICROSCOPIC
Bilirubin Urine: NEGATIVE
Glucose, UA: NEGATIVE mg/dL
Hgb urine dipstick: NEGATIVE
Ketones, ur: NEGATIVE mg/dL
Nitrite: NEGATIVE
Protein, ur: NEGATIVE mg/dL
Specific Gravity, Urine: 1.014 (ref 1.005–1.030)
Urobilinogen, UA: 0.2 mg/dL (ref 0.0–1.0)
pH: 6 (ref 5.0–8.0)

## 2010-06-01 LAB — BASIC METABOLIC PANEL
BUN: 23 mg/dL (ref 6–23)
CO2: 24 mEq/L (ref 19–32)
Calcium: 8.2 mg/dL — ABNORMAL LOW (ref 8.4–10.5)
Chloride: 104 mEq/L (ref 96–112)
Creatinine, Ser: 0.9 mg/dL (ref 0.4–1.2)
GFR calc Af Amer: >60 mL/min (ref >60)
GFR calc non Af Amer: >60 mL/min (ref >60)
Glucose, Bld: 97 mg/dL (ref 70–99)
Potassium: 3.9 mEq/L (ref 3.5–5.1)
Sodium: 137 mEq/L (ref 135–145)

## 2010-06-01 LAB — POCT I-STAT, CHEM 8
BUN: 43 mg/dL — ABNORMAL HIGH (ref 6–23)
Calcium, Ion: 0.95 mmol/L — ABNORMAL LOW (ref 1.12–1.32)
Chloride: 109 mEq/L (ref 96–112)
Creatinine, Ser: 1.3 mg/dL — ABNORMAL HIGH (ref 0.4–1.2)
Glucose, Bld: 97 mg/dL (ref 70–99)
HCT: 29 % — ABNORMAL LOW (ref 36.0–46.0)
Hemoglobin: 9.9 g/dL — ABNORMAL LOW (ref 12.0–15.0)
Potassium: 5 mEq/L (ref 3.5–5.1)
Sodium: 130 mEq/L — ABNORMAL LOW (ref 135–145)
TCO2: 23 mmol/L (ref 0–100)

## 2010-06-01 LAB — PROTIME-INR
INR: 1.16 (ref 0.00–1.49)
Prothrombin Time: 14.7 seconds (ref 11.6–15.2)

## 2010-06-01 LAB — APTT: aPTT: 32 seconds (ref 24–37)

## 2010-06-01 LAB — PREPARE RBC (CROSSMATCH)

## 2010-06-05 LAB — CBC
HCT: 26.2 % — ABNORMAL LOW (ref 36.0–46.0)
HCT: 29.6 % — ABNORMAL LOW (ref 36.0–46.0)
HCT: 30.4 % — ABNORMAL LOW (ref 36.0–46.0)
Hemoglobin: 10.1 g/dL — ABNORMAL LOW (ref 12.0–15.0)
Hemoglobin: 10.3 g/dL — ABNORMAL LOW (ref 12.0–15.0)
Hemoglobin: 8.6 g/dL — ABNORMAL LOW (ref 12.0–15.0)
MCHC: 32.9 g/dL (ref 30.0–36.0)
MCHC: 33.9 g/dL (ref 30.0–36.0)
MCHC: 34.3 g/dL (ref 30.0–36.0)
MCV: 90 fL (ref 78.0–100.0)
MCV: 90.2 fL (ref 78.0–100.0)
MCV: 91.9 fL (ref 78.0–100.0)
Platelets: 190 10*3/uL (ref 150–400)
Platelets: 202 10*3/uL (ref 150–400)
Platelets: 227 10*3/uL (ref 150–400)
RBC: 2.85 MIL/uL — ABNORMAL LOW (ref 3.87–5.11)
RBC: 3.29 MIL/uL — ABNORMAL LOW (ref 3.87–5.11)
RBC: 3.37 MIL/uL — ABNORMAL LOW (ref 3.87–5.11)
RDW: 14.2 % (ref 11.5–15.5)
RDW: 14.3 % (ref 11.5–15.5)
RDW: 14.5 % (ref 11.5–15.5)
WBC: 5.2 10*3/uL (ref 4.0–10.5)
WBC: 6 10*3/uL (ref 4.0–10.5)
WBC: 6.7 10*3/uL (ref 4.0–10.5)

## 2010-06-05 LAB — BASIC METABOLIC PANEL
BUN: 10 mg/dL (ref 6–23)
BUN: 16 mg/dL (ref 6–23)
BUN: 32 mg/dL — ABNORMAL HIGH (ref 6–23)
CO2: 26 mEq/L (ref 19–32)
CO2: 27 mEq/L (ref 19–32)
CO2: 27 mEq/L (ref 19–32)
Calcium: 8.3 mg/dL — ABNORMAL LOW (ref 8.4–10.5)
Calcium: 8.3 mg/dL — ABNORMAL LOW (ref 8.4–10.5)
Calcium: 9.3 mg/dL (ref 8.4–10.5)
Chloride: 102 mEq/L (ref 96–112)
Chloride: 102 mEq/L (ref 96–112)
Chloride: 105 mEq/L (ref 96–112)
Creatinine, Ser: 0.83 mg/dL (ref 0.4–1.2)
Creatinine, Ser: 0.93 mg/dL (ref 0.4–1.2)
Creatinine, Ser: 1.01 mg/dL (ref 0.4–1.2)
GFR calc Af Amer: >60 mL/min (ref >60)
GFR calc Af Amer: >60 mL/min (ref >60)
GFR calc Af Amer: >60 mL/min (ref >60)
GFR calc non Af Amer: 58 mL/min — ABNORMAL LOW (ref >60)
GFR calc non Af Amer: >60 mL/min (ref >60)
GFR calc non Af Amer: 53 mL/min — ABNORMAL LOW (ref >60)
Glucose, Bld: 105 mg/dL — ABNORMAL HIGH (ref 70–99)
Glucose, Bld: 96 mg/dL (ref 70–99)
Glucose, Bld: 98 mg/dL (ref 70–99)
Potassium: 4.2 mEq/L (ref 3.5–5.1)
Potassium: 4.7 mEq/L (ref 3.5–5.1)
Potassium: 5.2 mEq/L — ABNORMAL HIGH (ref 3.5–5.1)
Sodium: 133 mEq/L — ABNORMAL LOW (ref 135–145)
Sodium: 133 mEq/L — ABNORMAL LOW (ref 135–145)
Sodium: 137 mEq/L (ref 135–145)

## 2010-06-05 LAB — PROTIME-INR
INR: 1.22 (ref 0.00–1.49)
INR: 1.22 (ref 0.00–1.49)
INR: 1.4 (ref 0.00–1.49)
Prothrombin Time: 15.3 seconds — ABNORMAL HIGH (ref 11.6–15.2)
Prothrombin Time: 15.3 seconds — ABNORMAL HIGH (ref 11.6–15.2)
Prothrombin Time: 17 seconds — ABNORMAL HIGH (ref 11.6–15.2)

## 2010-06-05 LAB — POCT HEMOGLOBIN-HEMACUE: Hemoglobin: 11.1 g/dL — ABNORMAL LOW (ref 12.0–15.0)

## 2010-06-05 LAB — PREPARE RBC (CROSSMATCH)

## 2010-06-15 LAB — BASIC METABOLIC PANEL
BUN: 21 mg/dL (ref 6–23)
BUN: 22 mg/dL (ref 6–23)
BUN: 22 mg/dL (ref 6–23)
BUN: 25 mg/dL — ABNORMAL HIGH (ref 6–23)
CO2: 24 mEq/L (ref 19–32)
CO2: 25 mEq/L (ref 19–32)
CO2: 26 mEq/L (ref 19–32)
CO2: 26 mEq/L (ref 19–32)
Calcium: 7.6 mg/dL — ABNORMAL LOW (ref 8.4–10.5)
Calcium: 7.6 mg/dL — ABNORMAL LOW (ref 8.4–10.5)
Calcium: 7.8 mg/dL — ABNORMAL LOW (ref 8.4–10.5)
Calcium: 7.9 mg/dL — ABNORMAL LOW (ref 8.4–10.5)
Chloride: 95 mEq/L — ABNORMAL LOW (ref 96–112)
Chloride: 96 mEq/L (ref 96–112)
Chloride: 99 mEq/L (ref 96–112)
Chloride: 99 mEq/L (ref 96–112)
Creatinine, Ser: 1 mg/dL (ref 0.4–1.2)
Creatinine, Ser: 1.02 mg/dL (ref 0.4–1.2)
Creatinine, Ser: 1.04 mg/dL (ref 0.4–1.2)
Creatinine, Ser: 1.11 mg/dL (ref 0.4–1.2)
GFR calc Af Amer: 58 mL/min — ABNORMAL LOW (ref >60)
GFR calc Af Amer: >60 mL/min (ref >60)
GFR calc Af Amer: >60 mL/min (ref >60)
GFR calc Af Amer: >60 mL/min (ref >60)
GFR calc non Af Amer: 48 mL/min — ABNORMAL LOW (ref >60)
GFR calc non Af Amer: 51 mL/min — ABNORMAL LOW (ref >60)
GFR calc non Af Amer: 53 mL/min — ABNORMAL LOW (ref >60)
GFR calc non Af Amer: 54 mL/min — ABNORMAL LOW (ref >60)
Glucose, Bld: 106 mg/dL — ABNORMAL HIGH (ref 70–99)
Glucose, Bld: 140 mg/dL — ABNORMAL HIGH (ref 70–99)
Glucose, Bld: 150 mg/dL — ABNORMAL HIGH (ref 70–99)
Glucose, Bld: 151 mg/dL — ABNORMAL HIGH (ref 70–99)
Potassium: 3.8 mEq/L (ref 3.5–5.1)
Potassium: 4.1 mEq/L (ref 3.5–5.1)
Potassium: 4.3 mEq/L (ref 3.5–5.1)
Potassium: 4.3 mEq/L (ref 3.5–5.1)
Sodium: 126 mEq/L — ABNORMAL LOW (ref 135–145)
Sodium: 128 mEq/L — ABNORMAL LOW (ref 135–145)
Sodium: 131 mEq/L — ABNORMAL LOW (ref 135–145)
Sodium: 131 mEq/L — ABNORMAL LOW (ref 135–145)

## 2010-06-15 LAB — CBC
HCT: 27.6 % — ABNORMAL LOW (ref 36.0–46.0)
HCT: 28.1 % — ABNORMAL LOW (ref 36.0–46.0)
HCT: 30.6 % — ABNORMAL LOW (ref 36.0–46.0)
Hemoglobin: 10.1 g/dL — ABNORMAL LOW (ref 12.0–15.0)
Hemoglobin: 9.2 g/dL — ABNORMAL LOW (ref 12.0–15.0)
Hemoglobin: 9.2 g/dL — ABNORMAL LOW (ref 12.0–15.0)
MCHC: 32.8 g/dL (ref 30.0–36.0)
MCHC: 33.1 g/dL (ref 30.0–36.0)
MCHC: 33.1 g/dL (ref 30.0–36.0)
MCV: 92.4 fL (ref 78.0–100.0)
MCV: 92.9 fL (ref 78.0–100.0)
MCV: 93.3 fL (ref 78.0–100.0)
Platelets: 190 10*3/uL (ref 150–400)
Platelets: 192 10*3/uL (ref 150–400)
Platelets: 201 10*3/uL (ref 150–400)
RBC: 2.99 MIL/uL — ABNORMAL LOW (ref 3.87–5.11)
RBC: 3.02 MIL/uL — ABNORMAL LOW (ref 3.87–5.11)
RBC: 3.28 MIL/uL — ABNORMAL LOW (ref 3.87–5.11)
RDW: 13.7 % (ref 11.5–15.5)
RDW: 14.4 % (ref 11.5–15.5)
RDW: 14.7 % (ref 11.5–15.5)
WBC: 9 10*3/uL (ref 4.0–10.5)
WBC: 9.3 10*3/uL (ref 4.0–10.5)
WBC: 9.4 10*3/uL (ref 4.0–10.5)

## 2010-06-15 LAB — PROTIME-INR
INR: 1.16 (ref 0.00–1.49)
INR: 1.35 (ref 0.00–1.49)
INR: 1.53 — ABNORMAL HIGH (ref 0.00–1.49)
INR: 1.58 — ABNORMAL HIGH (ref 0.00–1.49)
Prothrombin Time: 14.7 seconds (ref 11.6–15.2)
Prothrombin Time: 16.6 seconds — ABNORMAL HIGH (ref 11.6–15.2)
Prothrombin Time: 18.3 seconds — ABNORMAL HIGH (ref 11.6–15.2)
Prothrombin Time: 18.7 seconds — ABNORMAL HIGH (ref 11.6–15.2)

## 2010-06-16 LAB — COMPREHENSIVE METABOLIC PANEL
ALT: 24 U/L (ref 0–35)
AST: 26 U/L (ref 0–37)
Albumin: 3.5 g/dL (ref 3.5–5.2)
Alkaline Phosphatase: 41 U/L (ref 39–117)
BUN: 31 mg/dL — ABNORMAL HIGH (ref 6–23)
CO2: 29 mEq/L (ref 19–32)
Calcium: 9.5 mg/dL (ref 8.4–10.5)
Chloride: 98 mEq/L (ref 96–112)
Creatinine, Ser: 0.98 mg/dL (ref 0.4–1.2)
GFR calc Af Amer: >60 mL/min (ref >60)
GFR calc non Af Amer: 55 mL/min — ABNORMAL LOW (ref >60)
Glucose, Bld: 116 mg/dL — ABNORMAL HIGH (ref 70–99)
Potassium: 5.7 mEq/L — ABNORMAL HIGH (ref 3.5–5.1)
Sodium: 136 mEq/L (ref 135–145)
Total Bilirubin: 0.7 mg/dL (ref 0.3–1.2)
Total Protein: 7.1 g/dL (ref 6.0–8.3)

## 2010-06-16 LAB — CBC
HCT: 29.5 % — ABNORMAL LOW (ref 36.0–46.0)
Hemoglobin: 9.8 g/dL — ABNORMAL LOW (ref 12.0–15.0)
MCHC: 33 g/dL (ref 30.0–36.0)
MCV: 91.7 fL (ref 78.0–100.0)
Platelets: 276 10*3/uL (ref 150–400)
RBC: 3.22 MIL/uL — ABNORMAL LOW (ref 3.87–5.11)
RDW: 13 % (ref 11.5–15.5)
WBC: 8 10*3/uL (ref 4.0–10.5)

## 2010-06-16 LAB — URINALYSIS, ROUTINE W REFLEX MICROSCOPIC
Bilirubin Urine: NEGATIVE
Glucose, UA: NEGATIVE mg/dL
Hgb urine dipstick: NEGATIVE
Ketones, ur: NEGATIVE mg/dL
Nitrite: NEGATIVE
Protein, ur: NEGATIVE mg/dL
Specific Gravity, Urine: 1.009 (ref 1.005–1.030)
Urobilinogen, UA: 0.2 mg/dL (ref 0.0–1.0)
pH: 7.5 (ref 5.0–8.0)

## 2010-06-16 LAB — TYPE AND SCREEN
ABO/RH(D): AB POS
Antibody Screen: NEGATIVE

## 2010-06-16 LAB — PROTIME-INR
INR: 1.07 (ref 0.00–1.49)
Prothrombin Time: 13.8 seconds (ref 11.6–15.2)

## 2010-06-16 LAB — PREPARE RBC (CROSSMATCH)

## 2010-06-16 LAB — APTT: aPTT: 24 seconds (ref 24–37)

## 2010-07-15 ENCOUNTER — Other Ambulatory Visit: Payer: Self-pay | Admitting: Internal Medicine

## 2010-07-15 DIAGNOSIS — Z1231 Encounter for screening mammogram for malignant neoplasm of breast: Secondary | ICD-10-CM

## 2010-07-21 ENCOUNTER — Ambulatory Visit
Admission: RE | Admit: 2010-07-21 | Discharge: 2010-07-21 | Disposition: A | Discharge status: Home / Self Care | Payer: Medicare Other | Source: Ambulatory Visit | Attending: Internal Medicine | Admitting: Internal Medicine

## 2010-07-21 DIAGNOSIS — Z1231 Encounter for screening mammogram for malignant neoplasm of breast: Secondary | ICD-10-CM

## 2010-07-24 ENCOUNTER — Ambulatory Visit (INDEPENDENT_AMBULATORY_CARE_PROVIDER_SITE_OTHER): Payer: Medicare Other | Admitting: Gynecology

## 2010-07-24 DIAGNOSIS — N952 Postmenopausal atrophic vaginitis: Secondary | ICD-10-CM

## 2010-07-24 DIAGNOSIS — Z79890 Hormone replacement therapy (postmenopausal): Secondary | ICD-10-CM

## 2010-07-28 NOTE — Discharge Summary (Signed)
NAMEELFA, WOOTON             ACCOUNT NO.:  [id number]   MEDICAL RECORD NO.:  [id number]          PATIENT TYPE:  INP   LOCATION:  1602                         FACILITY:  [facility]   PHYSICIAN:  Ollen Gross, M.D.    DATE OF BIRTH:  December 22, 1929   DATE OF ADMISSION:  12/01/2007  DATE OF DISCHARGE:  12/04/2007                               DISCHARGE SUMMARY   ADMITTING DIAGNOSES:  1. Osteoarthritis right knee.  2. Hypertension.  3. Macular degeneration.  4. Thoracic aneurysm, followed by Dr. Laneta Simmers.  5. History of pericarditis.  6. History of anemia.   DISCHARGE DIAGNOSES:  1. Osteoarthritis right knee status post right total knee replacement      arthroplasty.  2. Acute blood loss anemia.  3. Status post transfusion without sequelae.  2.  Hypertension.  4. Macular degeneration.  5. Thoracic aneurysm, followed by Dr. Laneta Simmers.  6. History of pericarditis.  7. History of anemia.  8. Mild postop hyponatremia.   PROCEDURE:  On December 01, 2007, right total knee.  Surgeon Dr.  Lequita Halt.  Assistant [employer].  Spinal anesthesia.   CONSULTATIONS:  None.   BRIEF HISTORY:  Ms. Blankenbeckler is a 77 year old female with end-stage  arthritis, medium patellofemoral compartments of the right knee,  progressive worsening pain and dysfunction, failed operative management,  now presents for total knee arthroplasty.   LABORATORY DATA:  Preop CBC:  Hemoglobin 12.2, hematocrit 34.7, white  cell count 5.6, platelets 268.  Serial CBCs were followed.  Hemoglobin  9.9 postop down to 8.5, last noted 8.1 and 24.8.  She did receive one  unit of blood prior to discharge.  PT/PTT preop 13.5 and 23,  respectively.  INR 1.0.  Serial protimes followed.  PT/INR 21.1 and 1.7.  Chem panel on admission:  Elevated BUN and creatinine preop 41 and 1.3,  respectively.  Remaining Chem panel all within normal limits.  Serial B  mets were followed.  Electrolytes remained within normal limits with  exception of  drop in sodium 137-136, last noted at 131.  Fluids were  discontinued and will allow the sodium came back up on its own.  BUN  dropped from 41-29 and was normal at 28 at time of discharge.  Creatinine dropped from 1.3 down to 1.0, normal at discharge.  Preop UA  negative.  Blood group type AB positive.   EKG, November 24, 2007, sinus rhythm, premature ventricular complexes,  possible left atrial enlargement, septal infarct age undetermined,  premature ventricular complex, septal infarct, new since previous  tracing from Dr. Clovis Riley.   HOSPITAL COURSE:  The patient was admitted to [facility],  tolerated procedure well, later transferred to the recovery room on  orthopedic floor, started on PCA and p.o. analgesic for pain control  following surgery, given 24 hours postop IV antibiotics.  Had a little  bit of nausea and vomiting on the evening of surgery, was feeling a  little bit better by the morning of day #1.  Hemoglobin was 9.9.  Placed  on Coumadin for DVT prophylaxis.  Left PCA going for another day.  Continue the  fluids with the nausea, that was better.  By day #2, nausea  had resolved.  Pain was under better control.  Discontinued the PCA, the  fluids and the Foley.  Sodium was down a little bit down to 131, so we  DC the fluids also and allowed that to titrate back up.  Started on iron  because hemoglobin was down to 8.5.  Rechecked the hemoglobin.  Despite  low hemoglobin.  The patient was doing pretty well with the therapy  walking about 100 feet.  Dressing changes on day #2, incision looked  good.  By day #3, she was going over 150 feet.  Although, the hemoglobin  went down a little bit further.  She is asymptomatic we felt due to the  low hemoglobin.  She was going home.  Wanted to do well with therapy.  We gave her one unit of blood prior to discharge.  She did have a fall,  unfortunately, early morning hours on postop day #3.  She had a little  abrasion  and a skin tear on her back.  Had a little redness there, so we  just put dry gauze and Polysporin on it, told her how to treat this at  home.  She did well with her therapy and was discharged home later that  morning.  The patient was discharged home on December 04, 2007.   DISCHARGE/PLAN:  1. The patient discharged on December 04, 2007.  2. Discharge diagnoses please see above.  3. Discharge medications:  Trinsicon, Percocet, Robaxin, Coumadin.   DISCHARGE INSTRUCTIONS:  1. Diet:  Heart-healthy diet.  2. Activity:  Weightbearing as tolerated right leg, total knee      protocol.  Home health PT/home health nursing.  3. Follow-up 2 weeks.   DISPOSITION:  Home.   CONDITION ON DISCHARGE:  Improved.      Alexzandrew L. Perkins, P.A.C.      Ollen Gross, M.D.  Electronically Signed    ALP/MEDQ  D:  01/19/2008  T:  01/19/2008  Job:  [id number]   cc:   Ollen Gross, M.D.  Fax: [phone]   Tasia Catchings, M.D.  Fax: [phone]   Lyn Records, M.D.  Fax: [phone]   Evelene Croon, M.D.  [address] [place] [address]  [place]

## 2010-07-28 NOTE — H&P (Signed)
NAMEKELLYANNE, Wall NO.:  [id number]   MEDICAL RECORD NO.:  [id number]          PATIENT TYPE:  INP   LOCATION:  1306                         FACILITY:  [facility]   PHYSICIAN:  Tasia Catchings, M.D.   DATE OF BIRTH:  18-Jan-1930   DATE OF ADMISSION:  01/05/2008  DATE OF DISCHARGE:                              HISTORY & PHYSICAL   [name] is a 77 year old patient who approximately 3-1/2 weeks  underwent an elective right knee replacement.  She was okay for the  first two weeks and had in-house rehabilitation and actually attended  one outpatient rehabilitation session; however, Sunday morning early,  she was going to the bathroom and she fell, sustaining a concussion as  well as a large hematoma on her left forehead around her eyes, contusion  of her right ankle and right knee.  She went to the emergency room and  had the appropriate x-rays, nothing was broken.  The CT scan of her head  looked okay.  But since then, she has had a really downhill course.  She  has been unable to do her rehabilitation.  She has been unable to eat  because of mild anorexia and weakness.  She has fallen several more  times, including another time today when she hit her knee.  She lives in  a house which is two stories, and she simply cannot get up and down the  stairs safely.   In addition, I saw her in the office yesterday, and she had a low serum  sodium of about 118, prompting me to discontinue her Diovan/HCT,  assuming that the HCT was the problem.  She also had some difficulty  with urination, mainly getting the stream started, and a little bit of  diarrhea.  The main problem, though, has been the weakness and the  inability to ambulate.  Her elderly husband has been unable to help her.   As a result, she is being admitted for physical therapy, occupational  therapy, and hopefully a transfer to a skilled nursing facility for  further rehab.  During this hospitalization, we will  also correct her  serum sodium and check her out for a urinary tract infection and treat  that appropriately.   Patient has many other chronic medical problems.  They include  intermittent pericarditis, which has not been recently a problem,  ascending aortic aneurysm discovered on a CT scan nine years ago and  followed by Drs. Bartle and Katrinka Blazing, atypical chest pain which is  costochondritis sometimes mistaken for pericarditis.  Anemia of chronic  disease, which she has had since 2005, and which has required occasional  Aranesp, left TMJ syndrome, and other DJD.  She has also had irritable  bowel syndrome and mild hypertension, which has been treated with both  the Coreg 6.25 mg twice daily (also given for her aneurysm) and  Diovan/HCT 160/12.5, which we have recently stopped.   PAST MEDICAL HISTORY:  1. Allergies to SULFITE and SHELLFISH.  2. Smoking:  Quit in 1982 after 15-pack years.  3. Alcohol 1-2 per month.  4. Caffeine:  Adjusting to daily  green tea.   CURRENT MEDICATIONS:  1. Premarin 0.3 mg daily.  2. Diovan/HCT 160/12.5, currently held.  3. Glucosamine with chondroitin sulfate.  4. Coreg 6.25 mg b.i.d.  5. She is no longer on Mobic but does take PreserVision daily.  6. Tylenol for pain.   PREVIOUS SURGERY:  1. Appendectomy.  2. TAH/BSO.  3. Right knee replacement.  4. Ureteral dilatation.  5. Breast biopsy.  6. Squamous tumor removed from right leg.  7. Cystocele and rectocele repair with collagen implant.  8. Bilateral lens replacement.  9. Left partial knee replacement.  10.Left hip repair.   FAMILY HISTORY:  Father died at age 38 of an MI.  Mother died at age 56  from a CVA.  Two brothers alive, one with a CVA and a the other has  prostate cancer.  Three sisters, one who died suddenly in her sleep, and  another who died of a dissecting aneurysm, and one alive and well with  DJD.  Two children alive and well.   SOCIAL HISTORY:  A [place] native who has  lived in [place] since  1964.  Remarried after divorce.  Then widow-hood since 1997.   PHYSICAL EXAMINATION:  Patient has a large ecchymotic area over the left  forehead with hematoma that extends down around both eyes.  HEENT is  otherwise negative.  NECK:  Supple without nodes, bruits, or thyromegaly.  CHEST:  Clear to P&A.  HEART:  Tones normal without extra sounds or murmurs.  EXTREMITIES:  Recent knee replacement, scar from the other knee  replacement, full range of motion, and ecchymosis over the right elbow.   Patient had a CT scan of her head repeated today, which showed no  evidence of a subdural.   IMPRESSION:  1. Recent knee replacement with a fall, now preventing her from doing      rehabilitation.  2. Hyponatremia.  3. Possible urinary tract infection.  4. Anemia of chronic disease.  5. Hypertension.  6. Mild macular degeneration.  7. Degenerative joint disease.      Tasia Catchings, M.D.  Electronically Signed    JW/MEDQ  D:  01/05/2008  T:  01/05/2008  Job:  [id number]   cc:   Ollen Gross, M.D.  Fax: [phone]

## 2010-07-28 NOTE — Discharge Summary (Signed)
[name], [name]             ACCOUNT NO.:  [id number]   MEDICAL RECORD NO.:  [id number]          PATIENT TYPE:  INP   LOCATION:  2041                         FACILITY:  [facility]   PHYSICIAN:  Tasia Catchings, M.D.   DATE OF BIRTH:  10-02-1929   DATE OF ADMISSION:  01/13/2008  DATE OF DISCHARGE:  01/15/2008                               DISCHARGE SUMMARY   DISCHARGE DIAGNOSES:  1. Supraventricular tachycardia documented prior to admission with      brief salvos thereafter.  2. Hypernatremia, resolved.  3. Falling, secondary to hypernatremia.  4. Degenerative joint disease, status post right knee replacement.  5. Hypertension.  6. Thoracic aortic aneurysm.  7. Chronic anemia.   DISCHARGE MEDICATIONS:  1. Diovan 160 q.a.m.  2. Coreg 25 mg b.i.d.  3. Glucosamine as needed.  4. Hydroxyzine 25 mg nightly  5. Premarin 0.3 mg daily.   FOLLOWUP:  The patient is to keep her appointment with me in 3 weeks and  to follow with Dr. Ollen Gross as necessary.  She is also to see Dr.  Katrinka Blazing this coming week in the office.   CONDITION:  Improved.   DIET:  Regular.   ACTIVITY:  Up ad lib.   BRIEF HISTORY:  [name] is a 77 year old white female who was  readmitted to the hospital on Friday with a 45-minute episode of SVT.  She had been hospitalized a week earlier for hypernatremia, which upon  resolution improved her many symptoms including confusion and falling.  From the standpoint of SVT, she had never had documented SVT before.  However, she had had some ectopy noted just before the elective knee  replacement and had been seen by Dr. Katrinka Blazing who felt that she should have  an increase her Coreg, which at that time was at 3.125 b.i.d. and it was  raised to 6.25.  In any case, she developed 45 minutes of chest pressure  and a feeling of heart fluttering, and was seen in the emergency room  and admitted.  Even though, by the time she arrived to the emergency  room, she had converted  back to sinus rhythm.   PHYSICAL EXAMINATION AT THE TIME OF ADMISSION:  Unremarkable, except for  the recent knee surgery.  Lungs were clear.  Heart sounds normal.   LABORATORY DATA:  She had fairly normal CBC and CMET without any  evidence of hypernatremia and a TSH returned normal as well.   She also underwent a CT scan of the chest looking for pulmonary embolus,  which was negative.   HOSPITAL COURSE:  The patient was placed at bedrest and on IV fluids.  She was also set up with a monitor, which revealed a couple of small  episodes less than 10 seconds in duration of SVT as well.  Her Coreg was  increased to 12.5 mg twice a day and she was seen just prior to  discharge by Dr. Katrinka Blazing who recommended we further raise it to 25 mg  b.i.d.   DISCHARGE PLAN:  As above.      Tasia Catchings, M.D.  Electronically Signed  JW/MEDQ  D:  01/15/2008  T:  01/16/2008  Job:  [id number]

## 2010-07-28 NOTE — H&P (Signed)
[name], [name]             ACCOUNT NO.:  [id number]   MEDICAL RECORD NO.:  [id number]          PATIENT TYPE:  INP   LOCATION:  2041                         FACILITY:  [facility]   PHYSICIAN:  Darryl D. Prime, MD    DATE OF BIRTH:  07-10-1929   DATE OF ADMISSION:  01/13/2008  DATE OF DISCHARGE:                              HISTORY & PHYSICAL   CODE STATUS:  Full code.   PRIMARY CARE PHYSICIAN:  Tasia Catchings, M.D.   TOTAL ENCOUNTER TIME:  Approximately 55 minutes.   CHIEF COMPLAINT:  Chest fullness and palpitations.   The patient was the historian; she is a good historian.   HISTORY OF PRESENT ILLNESS:  [name] is a 77 year old female with  history of a recent elective right knee replacement on December 01, 2007.  On January 05, 2008, she fell, suffering a significant hematoma  on the left forehead, she did suffer a concussion with a contusion to  her ankle and knee; history of hypertension; she has a history of a  thoracic aortic aneurysm, history of recent hyponatremia and  deconditioning who presents with chest fullness and tachy palpitations.  She notes the tachy palpitations began tonight and lasted around 45  minutes then went away apparently spontaneously.  She had an episode of  vomiting but she denies any chest pain, recent fever.  She denies any  recent decrease in appetite.  The patient was brought in by EMS and the  strip shows that she was in apparent supraventricular tachycardia in the  range of 160.   PAST MEDICAL HISTORY:  As above.  She has a history of recent anorexia,  history of intermittent pericarditis and a history of atypical chest  pain costochondritis, history of anemia of chronic disease, history of  TMJ (temporomandibular joint) syndrome, history of degenerative joint  disease, history of irritable bowel syndrome, history of left partial  knee replacement, history of left hip repair, history of total abdominal  hysterectomy and bilateral  salpingo-oophorectomy, status post  appendectomy, history of cystocele and rectocele, history of squamous  cell carcinoma since removed, history of bilateral lens implants.   MEDICATIONS:  1. She is on Diovan 160 mg in the morning.  2. Coreg 6.25 mg twice a day.  3. Glucosamine p.r.n.  4. Hydroxyzine 25 q.h.s.  5. Premarin 0.3 mg daily.   ALLERGIES:  KEFLEX, SULFA, CEPHALOSPORINS, IODINE, CIPROFLOXACIN.   FAMILY HISTORY:  The patient's family history, father died of a  myocardial infarction at the age of 21.  Mother died of a stroke at the  age of 10.  She has one sister that died of dissecting aneurysm.   SOCIAL HISTORY:  She is widowed since 18.  No tobacco, alcohol or drug  use.   REVIEW OF SYSTEMS:  A 14 point review of systems was negative unless  stated above.   PHYSICAL EXAMINATION:  VITAL SIGNS:  Temperature is 98.2 with a blood  pressure of 184/95, respiratory rate of 18, sats are 97% on room air  with a pulse of 80.  GENERAL:  She is a female who looks younger  than her stated age lying  flat in bed in no acute distress.  HEENT:  Normocephalic, atraumatic with pupils equal, round and reactive  to light with extraocular movements intact.  Oropharynx shows no  posterior lesions.  It is dry.  NECK:  Supple with no lymphadenopathy or thyromegaly.  No jugular venous  distention.  LUNGS:  Clear to auscultation bilaterally.  CARDIOVASCULAR:  Regular rhythm and rate with no murmurs, rubs or  gallops.  Normal S1, S2, no S3 or S4.  ABDOMEN:  Soft, nontender, nondistended with no hepatosplenomegaly.  EXTREMITIES:  Show no clubbing, cyanosis or edema.  NEUROLOGICAL:  Alert and oriented x4 with cranial nerves II-XII grossly  intact.  Strength and sensation were grossly intact.   LABORATORY DATA:  Shows on iSTAT a CO2 of 24, hemoglobin 10.5,  hematocrit of 31, sodium of 128, potassium of 5.1, chloride 99, glucose  98, BUN 5.0, creatinine 1.4, baseline creatinine is in the  range of 0.8.  White count of 7.4 with a hemoglobin of 9.7, hematocrit 29.4, platelets  362, segs of 53%.  D-dimer 1.57.  Cardiac markers x2 were negative.  She  had a CT of the chest which was negative for pulmonary embolus.  She did  have an ascending aorta fusiform aortic aneurysm with 4.7 x 4.5 which is  mildly increased compared to 4.4 x 4.1 on prior exam in centimeters.  No  dissection.  She had a hiatal hernia and mild distal esophageal wall  thickening.  Chest x-ray showed chronic lung changes, nothing acute.  EKG shows normal sinus rhythm with a rate of 70 beats per minute.  QT  correct at 388.   ASSESSMENT AND PLAN:  This is a patient with a history of significant  deconditioning recently after a right knee replacement.  She now  presents with SVT (supraventricular tachycardia) and acute renal  insufficiency.  She is likely significantly hypovolemic.  She has no  pulmonary embolism on CT.  The patient will be admitted to rule out  acute coronary syndrome and we will check a TSH and a magnesium.  She  will be given low dose aspirin in the meantime.  We will increase her  Coreg to prevent any more episodes of possible SVT to 12.5 mg twice a  day.  She may benefit from the addition of calcium channel blockers or  if the SVT persists she may benefit from possible ablation at some  point.      Darryl D. Prime, MD  Electronically Signed     DDP/MEDQ  D:  01/14/2008  T:  01/14/2008  Job:  [id number]

## 2010-07-28 NOTE — Assessment & Plan Note (Signed)
OFFICE VISIT   [name], [name]  DOB:  04/06/1929                                        January 06, 2010  CHART #:  [id number]   HISTORY:  The patient returned to my office today for followup of an  ascending aortic aneurysm.  I last saw her in September 2007, at which  time the MR angiogram showed no change in the 4.5-cm ascending aortic  aneurysm.  She did have a followup study done in 2009 but did not return  to the office at that time.  The study in 2009 showed a maximum diameter  of about 4.7 cm.  She said that over the past year she has had no chest  pain or pressure.  Her only problem has been with her left hip as she  has undergone left hip replaced in 2010 and subsequently had two  dislocations in the first month or so postoperatively and had to undergo  two further revisions most recently several weeks ago.  She said she is  now walking without a cane and has no pain in her hip and seems to be  doing well.   PHYSICAL EXAMINATION:  Today her blood pressure is 148/80, pulse is 68  and regular, respiratory rate is 16 and unlabored.  Oxygen saturation on  room air is 96%.  She looks well.  Cardiac exam shows a regular rate and  rhythm with a 1/6 diastolic murmur of aortic insufficiency.  Her lungs  are clear.   A followup MR angiogram of the chest shows that the maximum diameter of  her ascending aortic aneurysm is at 4.6 cm which is essentially  unchanged over the past 4 years.   IMPRESSION:  The patient has a stable ascending aortic aneurysm on the  maximum diameter at this time of 4.6 cm.  Her aneurysm was diagnosed in  1999, at which time the diameter was about 4 cm.  It has slowly enlarged  overtime and the diameter is remained about 4.6 cm over the past 4  years.  She is now 77 years old but is still in fairly good shape, still  very active despite her left hip problems.  I have recommended that we  repeat a followup MR angiogram in about  2 years to reassess this.  She  did have a family history of aortic dissection with two sisters dying of  suspected aortic dissection in their 1s.  She does have a slight murmur  of aortic insufficiency, which I did not note on my previous exam when I  saw her 4 years ago.  She said she has not had an echocardiogram but is  following up with Dr. Verdis Prime on a yearly basis.  She probably  should have an echocardiogram done at her next visit to assess her  aortic valve as a baseline.  We again reviewed the importance of good  blood pressure control and she said that she frequently checks her blood  pressure at home and is usually running in the 120s.  She said her blood  pressure occasionally drops down to high 90s and she feels a little weak  and dizzy.  I discussed the signs and symptoms of aortic dissection with  her.  She will let me know immediately if she develops any of those  signs  or symptoms.  I think her risk for aortic dissection is certainly  increased due to her strong family history, but her aneurysm appears  stable at 4.6 cm, and the risk of surgery at this time given her age  outweighs the risk of aortic dissection.  I will plan to see her back in  2 years with a repeat MR angiogram of the chest.   Evelene Croon, M.D.  Electronically Signed   BB/MEDQ  D:  01/06/2010  T:  01/07/2010  Job:  [id number]   cc:   Deirdre Peer. Polite, M.D.

## 2010-07-28 NOTE — Discharge Summary (Signed)
[name], [name]             ACCOUNT NO.:  [id number]   MEDICAL RECORD NO.:  [id number]          PATIENT TYPE:  INP   LOCATION:  1306                         FACILITY:  [facility]   PHYSICIAN:  Tasia Catchings, M.D.   DATE OF BIRTH:  19-Jul-1929   DATE OF ADMISSION:  01/05/2008  DATE OF DISCHARGE:  01/09/2008                               DISCHARGE SUMMARY   DISCHARGE DIAGNOSES:  1. Hyponatremia.  2. Falling secondary to hyponatremia with contusion to the left      forehead, right elbow, and right knee.  3. Degenerative joint disease status post right knee replacement.  4. Hypertension.  5. Thoracic aortic aneurysm.   DISCHARGE MEDICATIONS:  1. Diovan 160 mg q.a.m.  2. Coreg 625 b.i.d..  3. Glucosamine as needed.  4. Hydroxyzine 25 mg q.h.s.  5. Premarin 0.3 mg daily.   FOLLOW UP:  The patient is to see me in four weeks but to call me in one  week with her blood pressures since she is going to monitor them at  home.  She is also to see Dr. Ollen Gross this Friday and to have home  health physical therapy as well as center physical therapy to help her  with ambulation.   CONDITION:  Improved.   DIET:  Regular.   ACTIVITY:  Activity is as described by physical therapy.   BRIEF HISTORY:  [name] is a 77 year old female who, about four  weeks ago, underwent elective right knee replacement and did well for  about two weeks but then fell about one week prior to admission.  This  fall was a significant fall, bruised her face, her elbow, and her knee,  and resulted in an emergency room visit which looked okay in terms of x-  ray.  However, progressively thereafter, she became more confused and  also began to fall more.  Accordingly, we saw her in the office and  found that her serum sodium was down to 117.  Presumably this was due to  her hydrochlorothiazide which she was taking for her blood pressure,  however, I could not rule out that she had developed a subdural and  maybe increased intracranial pressure, so a repeat CT scan was obtained  and that fortunately did not reveal a subdural.  As a result of her  inability to really ambulate at home and to manage going upstairs  because her bedrooms were above the first floor as was her bathroom, we  admitted her to the hospital.   Physical exam was negative with the following exceptions.  Blood  pressure was about 150/90.  She had a very large contusion over her left  eye with hematoma surrounding both eyes.  There was a small contusion on  her right elbow and on her right knee.  Neurologic exam revealed a wide-  based gait but no pathological reflexes.   LABORATORY DATA:  The patient's her initial serum sodium was 119 and  that improved to 132 by the time of discharge.  Urinalysis, CBC, and  CMET were otherwise normal with the exception of her chronic anemia  which was  at her baseline of around 9.5-10 hemoglobin.  X-ray of her  knee was unremarkable.  Chest x-ray was unremarkable and, as mentioned,  her CT scan of her head was unremarkable except for an old infarct.   HOSPITAL COURSE:  The patient was placed at bedrest and on IV fluids and  seen by OT and PT.  With IV fluids alone and with the cessation of her  hydrochlorothiazide, her serum sodium came up and all her other  symptoms, including confusion and instability of gait, improved, and so  did her appetite, urination, etc.  As a result, when she was finally  evaluated by OT and PT, they felt she could successfully be treated at  home without going to a skilled nursing facility.  Therefore, she is  being discharged on the above medications.      Tasia Catchings, M.D.  Electronically Signed     JW/MEDQ  D:  01/09/2008  T:  01/09/2008  Job:  [id number]

## 2010-07-28 NOTE — H&P (Signed)
NAMEMELEK, [name]             ACCOUNT NO.:  [id number]   MEDICAL RECORD NO.:  [id number]          PATIENT TYPE:  INP   LOCATION:  NA                           FACILITY:  [facility]   PHYSICIAN:  Ollen Gross, M.D.    DATE OF BIRTH:  26-Sep-1929   DATE OF ADMISSION:  DATE OF DISCHARGE:                              HISTORY & PHYSICAL   CHIEF COMPLAINT:  Right knee pain.   HISTORY OF PRESENT ILLNESS:  The patient is a 77 year old female who has  seen by Dr. Lequita Halt for ongoing knee pain.  She is known to have medium  patellofemoral arthritis in the right knee.  She has been treated  conservatively in the past for her knees and has undergone injections.  Unfortunately the knee continues to cause problem, getting out, and  causing pain.  She has been admitted for undergoing surgical  intervention.  Risks and benefits have been discussed.  She elects to  proceed with surgery.   ALLERGIES:  SULFA medications cause swelling. KEFLEX cause blurred  vision.  CIPRO causes hives and rash.   CURRENT MEDICATIONS:  1. Coreg.  2. Mobic.  3. Diovan HCT.  4. Premarin.  5. Aspirin.  6. Ibuprofen.  7. PreserVision.  8. Multivitamin.  9. Vitamin C.  10.Vitamin D.  11.Fish oil.  12.B complex.   PAST MEDICAL HISTORY:  1. Hypertension.  2. Macular degeneration.  3. Thoracic aneurysm which is followed by Dr. Evelene Croon.  4. History of pericarditis.  5. History of anemia.   PAST SURGICAL HISTORY:  1. Surgery for right leg compound fracture at age 34.  2. Pin removal at age 42.  3. Spur excision right wrist.  4. Right thumb surgery.  5. Two ovarian cystectomies.  6. Hysterectomy.  7. Bladder repair.   FAMILY HISTORY:  Father deceased age 11 secondary MI.  Mother deceased  at 41 with history of stroke.   SOCIAL HISTORY:  Denies the use tobacco.  Seldom intake of alcohol.  Two  children.   REVIEW OF SYSTEMS:  GENERAL:  No fevers, chills or night sweats.  NEURO:  No seizures, syncope,  or paralysis.  RESPIRATORY: No shortness of  breath, cough or hemoptysis.  CARDIOVASCULAR:  No chest pain, GI: No  nausea, vomiting, diarrhea, or constipation.  GU: No dysuria or  hematuria.  Musculoskeletal: Right knee.   PHYSICAL EXAMINATION:  VITAL SIGNS: Pulse 70, respirations 12, blood  pressure 146/82.  GENERAL: This 77 year old white female well-nourished, well-developed,  in no acute distress.  Alert, oriented, cooperative, and pleasant.  HEENT: Normocephalic, atraumatic.  Pupils are round and reactive.  Oropharynx clear.  EOMs intact.  NECK:  Supple.  CHEST: Clear to anterior and posterior chest walls.  No rhonchi, rales  or wheezing.  HEART: Regular rate and rhythm.  No murmur.  S1 and S2 noted.  ABDOMEN: Soft and nontender.  Bowel sounds present.  BREASTS/GENITALIA:  Not done.  EXTREMITIES:  Right knee has slight posterior swelling in the knee.  No  instability.  Marked crepitus noted on passive range of motion.   IMPRESSION:  Osteoarthritis right knee.  PLAN:  The patient is admitted to [facility] to undergo a  right total knee replacement arthroplasty.  Surgery will be performed by  Dr. Ollen Gross.      Alexzandrew L. Perkins, P.A.C.      Ollen Gross, M.D.  Electronically Signed    ALP/MEDQ  D:  11/30/2007  T:  12/03/2007  Job:  [id number]   cc:   Tasia Catchings, M.D.  Fax: [phone]   Garnette Scheuermann, MD   Evelene Croon, M.D.  [address] [place] [address]  [place]   ?

## 2010-07-28 NOTE — Op Note (Signed)
NAMEABRI, VACCA             ACCOUNT NO.:  [id number]   MEDICAL RECORD NO.:  [id number]          PATIENT TYPE:  INP   LOCATION:  0004                         FACILITY:  [facility]   PHYSICIAN:  Ollen Gross, M.D.    DATE OF BIRTH:  [birth year]   DATE OF PROCEDURE:  12/01/2007  DATE OF DISCHARGE:                               OPERATIVE REPORT   PREOPERATIVE DIAGNOSIS:  Osteoarthritis, right knee.   POSTOPERATIVE DIAGNOSIS:  Osteoarthritis, right knee.   PROCEDURE:  Right total knee arthroplasty.   SURGEON:  Ollen Gross, M.D.   ASSISTANT:  Avel Peace, PA-C.   ANESTHESIA:  Spinal.   ESTIMATED BLOOD LOSS:  Minimal.   DRAINS:  None.   COMPLICATIONS:  None.   CONDITION:  Stable to recovery.   TOURNIQUET TIME:  30 minutes at 300 mmHg.   BRIEF CLINICAL NOTE:  [name] is a 77 year old female who has end-  stage medial and patellofemoral arthritis of the right knee with  progressively worsening pain and dysfunction.  She has failed  nonoperative management including injections and presents now for right  total knee arthroplasty.   PROCEDURE IN DETAIL:  After successful administration of spinal  anesthetic, a tourniquet was placed high on the right thigh, right lower  extremity prepped and draped in the usual sterile fashion.  Extremity  was wrapped in Esmarch, knee flexed, tourniquet inflated to 300 mmHg.  A  midline incision was made with 10 blade through subcutaneous tissue to  the level of the extensor mechanism.  A fresh blade is used make a  medial parapatellar arthrotomy.  Soft tissue over the proximal medial  tibia is subperiosteally elevated to the joint line with the knife and  into the semimembranosus bursa with a Cobb elevator.  Soft tissue  laterally is elevated to the joint line with attention being paid to  avoiding the patellar tendon on tibial tubercle.  The patella subluxed  laterally, knee flexed 90 degrees, ACL and PCL removed.  Drill was used  create a starting hole in the distal femur and canal was thoroughly  irrigated.  A 5 degree right valgus alignment guide is placed  referencing off the posterior condyles, rotations marked and a block  pinned to remove 10 mm off the distal femur.  Distal femoral resection  is made with an oscillating saw.  Sizing block was placed, size 4 the AP  plane but 3 in the mediolateral, so thus we would use a 4 narrow  prosthesis.  Size 4 cutting block is placed with rotation marked at the  epicondylar axis.  The anterior, posterior and chamfer cuts were then  made.   The tibia subluxed forward and the menisci are removed.  Extramedullary  tibial alignment guide is placed referencing proximally at the medial  aspect of the tibial tubercle and distally along the second metatarsal  axis and tibial crest.  The block is pinned to remove minimal bone from  the deficient medial side.  There was pretty large deficiency medially.  Tibial resection is made with an oscillating saw.  Size 3 is most  appropriate tibial component and  the proximal tibia is prepared with the  modular drill and keel punch for size 3.  Femoral preparation is  completed with the intercondylar cut for size 4 narrow.   The size 3 mobile bearing tibial trial, size 4 narrow posterior  stabilized femoral trial and a 10 mm posterior stabilized rotating  platform insert trial were placed.  With a 10 she really hyperextends  quite a bit.  I believe this was due to the fact that we had to take a  fair amount of bone to get to the base of that medial defect.  We went  to 12.5 and then 15.  With the 15 mm insert, she had full extension with  excellent varus-valgus, anterior-posterior balance throughout full range  of motion.  The patella was everted and thickness measured to be 22 mm.  Freehand resection taken to 13 mm, 35 template is placed, lug holes were  drilled, trial patella is placed and it tracks normally.  Osteophytes  removed off  the posterior femur with the trial in place.  All trials  removed and the cut bone surfaces are prepared with pulsatile lavage.  Cement was mixed and once ready for implantation, the size 3 mobile  bearing tibial tray, size 4 narrow posterior stabilized femur and 35  patella are cemented into place.  Patella was held the clamp.  Trial 15-  mm insert is placed and knee held in full extension and all extruded  cement removed.  When the cement was fully hardened then the permanent  15 mm posterior stabilized rotating platform insert is placed into the  tibial tray.  The wound was copiously irrigated with saline solution and  the FloSeal injected on the posterior capsule, the medial and lateral  gutters and suprapatellar area.  Moist sponge is placed and tourniquet  released with total time of 30 minutes.  The sponge was held for 2  minutes then removed.  There is minimal bleeding and the bleeding that  is encountered is stopped with electrocautery.  Wound was again  irrigated and the arthrotomy closed with interrupted #1 PDS.  Subcu  closed with interrupted 2-0 Vicryl and subcuticular running 4-0  Monocryl.  Incision was cleaned and dried and Steri-Strips and a bulky  sterile dressing are applied.  She is then placed into a knee  immobilizer, awakened and transferred to recovery in stable condition.      Ollen Gross, M.D.  Electronically Signed     FA/MEDQ  D:  12/01/2007  T:  12/04/2007  Job:  [id number]

## 2010-07-28 NOTE — Consult Note (Signed)
NAMEJANNETH, [name]             ACCOUNT NO.:  [id number]   MEDICAL RECORD NO.:  [id number]          PATIENT TYPE:  INP   LOCATION:                               FACILITY:  [facility]   PHYSICIAN:  Lyn Records, M.D.   DATE OF BIRTH:  Jul 19, 1929   DATE OF CONSULTATION:  01/15/2008  DATE OF DISCHARGE:                                 CONSULTATION   REASON FOR EVALUATION AND MANAGEMENT:  Elevated blood pressure and PSVT.   CONCLUSIONS:  1. Paroxysmal supraventricular tachycardia, nonsustained.  2. Poor blood pressure control.  3. History of thoracic aneurysm.   RECOMMENDATIONS:  1. Increase Coreg therapy to 25 mg twice a day.  2. May need to use additional antihypertensive therapy for blood      pressure control with Coreg is unsuccessful along with Diovan.  3. We are trying to avoid diuretic therapy because of recent severe      hyponatremia.  4. If the current recommendations did not control the patient's blood      pressure, we may consider using Bystolic along with an ARB and/or a      centrally acting medication.  5. Blood pressure control is very important because of the patient's      thoracic aneurysm.   COMMENTS:  The patient is 45 and was admitted to the hospital with  episodes of tachy palpitations and weakness.  She was found to have PSVT  lasting less than 30 seconds and self terminating.  She was found to  have significant elevation in blood pressure.  There was no chest  discomfort.  Coreg dose was increased.  Recently, diuretic therapy had  been discontinued because of hyponatremia and falling episodes.   Her significant medical problems include:  1. Hypertension.  2. Thoracic aneurysm.  3. Osteoarthritis.  4. Costochondritis.  5. History of pericarditis.  6. Recent bilateral knee replacements.   ALLERGIES:  1. KEFLEX.  2. SULFA.  3. CEPHALOSPORINS.  4. IODINE.  5. CIPRO.   FAMILY HISTORY:  Noncontributory.   REVIEW OF SYSTEMS:  Unremarkable.   PHYSICAL EXAMINATION:  GENERAL:  The patient is in no acute distress.  VITAL SIGNS:  Blood pressure is 170/92.  The heart rate is 68.  NECK:  The neck veins are not distended.  LUNGS:  Clear.  CARDIAC:  No gallop or rub.  EXTREMITIES:  No edema.   A 12-lead EKG demonstrates normal sinus rhythm with inferior T-wave  inversion and prominent voltage.   DISCUSSION:  The patient has a thoracic aneurysm, significant blood  pressure elevation, and now episodes of PSVT.  We have titrated up beta-  blocker therapy.  We may need to add additional antihypertensive agents.  I do believe it is safer for her to be discharged home and have her  blood pressure follow as an outpatient.  She will call if recurrent  arrhythmia, chest pain, syncope, or other complaints.      Lyn Records, M.D.  Electronically Signed     HWS/MEDQ  D:  01/15/2008  T:  01/15/2008  Job:  [id number]   cc:  Tasia Catchings, M.D.

## 2010-07-31 NOTE — H&P (Signed)
NAME:  [name], [name]                       ACCOUNT NO.:  [id number]   MEDICAL RECORD NO.:  [id number]                   PATIENT TYPE:  INP   LOCATION:  NA                                   FACILITY:  [facility]   PHYSICIAN:  Gus Rankin. Aluisio, M.D.              DATE OF BIRTH:  07-30-1929   DATE OF ADMISSION:  12/04/2001  DATE OF DISCHARGE:                                HISTORY & PHYSICAL   CHIEF COMPLAINT:  Left knee pain.   HISTORY OF PRESENT ILLNESS:  The patient is a 77 year old female who has  been seen and evaluated for left knee pain by Dr. Ollen Gross.  She was  seen in the office and has isolated medial compartment arthritis in the left  knee.  This has been ongoing for some time now.  She is very active and  enjoys tennis.  She has been treated conservatively for her medial  compartment arthritis in the past utilizing anti-inflammatories and also has  undergone cortisone injections.  The injections only provided minimal relief  about a week with her last injection before it wore off.  The pain has been  increasing and has continued to a point where she is looking forward to  having something done about this.  She is seen in follow-up.  It is felt she  would benefit from undergoing a unicompartmental knee replacement.  Risks  and benefits of this procedure have been discussed with the patient and she  has elected to proceed with surgery.   ALLERGIES:  SULFA medication causes swelling, KEFLEX caused blurred vision,  CIPRO caused hives and rash.   CURRENT MEDICATIONS:  1. Diovan/hydrochlorothiazide 160/12.5 mg daily.  2. Premarin 0.65 mg daily days 1-25.  3. Provera days 16 and 17.  4. Baby aspirin q.d.  5. Allegra tablets p.r.n.   PAST MEDICAL HISTORY:  Hypertension.  She also has a thoracic aneurysm which  is followed by Dr. Evelene Croon.  She has been seen recently and he states  this is stable and there has been no change.   PAST SURGICAL HISTORY:  She had a  surgical intervention for a right leg  compound fracture at age 77.  She did have some pin removals from that same  leg at age 77.  She has had a spur excision from the right wrist.  She has  also had right thumb surgery.  She has had two ovarian cystectomies.  She  has undergone a hysterectomy and also a bladder repair.   FAMILY HISTORY:  Father deceased age 71 secondary to MI.  Mother deceased  age 42 with a history of strokes.   SOCIAL HISTORY:  She is married.  Denies use of tobacco products.  Only has  seldom intake of alcohol.  She has two children.  Her husband will be  assisting her with her care postoperatively.   REVIEW OF SYMPTOMS:  GENERAL:  No fevers,  chills, night sweats.  NEUROLOGIC:  No seizures, syncope, paralysis.  RESPIRATORY:  No shortness of breath,  productive cough, or hemoptysis.  CARDIOVASCULAR:  No chest pain, angina, or  orthopnea.  She does have a thoracic aneurysm and has been told recently  that this is stable from her last check-up.  GASTROINTESTINAL:  No nausea,  vomiting, diarrhea, constipation.  GENITOURINARY:  No dysuria, hematuria, or  discharge.  MUSCULOSKELETAL:  Pertinent to that of the left knee found in  the history of present illness.   PHYSICAL EXAMINATION:  VITAL SIGNS:  Pulse 72, respirations 16, blood  pressure 130/88.  GENERAL:  The patient is a 77 year old white female well-nourished, well-  developed.  Appears in no acute distress.  She is alert, oriented, and  cooperative.  She is very pleasant at time of examination.  Appears to be an  excellent historian.  HEENT:  Normocephalic, atraumatic.  Pupils round and reactive.  Oropharynx  is clear.  EOMs are intact.  She does have a lower denture plate noted.  NECK:  Supple.  No carotid bruits are appreciated.  CHEST:  Clear to auscultation anterior and posterior chest walls.  No  rhonchi, rales, or wheezing is appreciated.  HEART:  Regular rate and rhythm.  No murmurs.  S1 and S2 are  noted.  ABDOMEN:  Soft, nontender.  Bowel sounds are present.  No rebound or  guarding.  RECTAL:  Not done.  Not pertinent to present illness.  BREASTS:  Not done.  Not pertinent to present illness.  GENITALIA:  Not done.  Not pertinent to present illness.  EXTREMITIES:  Left lower extremity:  She does have patellofemoral crepitus  noted palpable on passive range of motion.  The left knee has range of  motion of 0-115 degrees.  She is moderately tender to palpation over the  medial joint line.  Distal pulses are noted to be intact.  Sensation is  intact.   IMPRESSION:  1. Medial compartment osteoarthritis, left knee.  2. Hypertension.  3. Thoracic aneurysm.   PLAN:  The patient admitted to [facility] to undergo left knee  unicompartmental replacement arthroplasty.  Surgery is to be performed by  Dr. Ollen Gross.  Her medical doctor is Dr. Sherin Quarry.  Dr. Sherin Quarry will  be notified of the room number on admission, be consulted if needed for any  medical assistance with this patient throughout the hospital course.  Her  cardiovascular thoracic surgeon is Dr. Evelene Croon.  Dr. Laneta Simmers will be  notified if needed with assistance with this patient throughout the hospital  course due to her thoracic aneurysm.     Alexzandrew L. [place], [place]                Gus Rankin Aluisio, M.D.    ALP/MEDQ  D:  11/30/2001  T:  11/30/2001  Job:  [id number]   cc:   Tasia Catchings, MD  [address]  [place]  [place] [address]  Fax: [phone]   Alleen Borne, M.D.  [address]  [place]  [place] [address]  Fax: [phone]

## 2010-07-31 NOTE — Op Note (Signed)
NAME:  [name], [name]                       ACCOUNT NO.:  [id number]   MEDICAL RECORD NO.:  [id number]                   PATIENT TYPE:  INP   LOCATION:  0003                                 FACILITY:  [facility]   PHYSICIAN:  Gus Rankin. Aluisio, M.D.              DATE OF BIRTH:  03/28/1929   DATE OF PROCEDURE:  12/04/2001  DATE OF DISCHARGE:                                 OPERATIVE REPORT   PREOPERATIVE DIAGNOSES:  Right knee medial compartment arthritis.   POSTOPERATIVE DIAGNOSES:  Right knee medial compartment arthritis.   PROCEDURE:  Right knee unicompartmental replacement.   SURGEON:  Gus Rankin. Aluisio, M.D.   ASSISTANT:  Alexzandrew L. Perkins, PA   ANESTHESIA:  Spinal.   ESTIMATED BLOOD LOSS:  Minimal.   DRAIN:  Hemovac x1.   COMPLICATIONS:  None.   CONDITION:  Stable to recovery.   BRIEF CLINICAL NOTE:  Ms. Hamblen is a 77 year old female with severe medial  compartment knee pain with bone on bone arthritis. The patellofemoral and  lateral compartments looked fine. She has had pain refractory to  nonoperative management including injection and presents now for left knee  unicompartmental replacement.   DESCRIPTION OF PROCEDURE:  After successful administration of spinal  anesthetic, a tourniquet was placed high on the left thigh, left lower  extremity prepped and draped in the usual sterile fashion. The extremity was  wrapped in Esmarch, knee flexed, tourniquet inflated to 300 mmHg. A small  medial left incision was made just adjacent to the patella. The skin was cut  with a 10 blade through the subcutaneous to the level of the extensor  mechanism. Medial arthrotomy was made just a centimeter on the proximal  tibia coursing through the superior pole of the patella. The meniscus is  then removed. Exposure is obtained from the medial compartment showing bone  on bone change involving the medial condyle and plateau. We then placed the  extramedullary tibial alignment  guide referencing proximally at the medial  aspect of the tibial tubercle and distally along the second metatarsal axis  and tibial crest.  In the block, they removed 4 mm off the proximal tibia.  Tibial resection was made with an oscillating saw and then a reciprocal saw  was used to create the cut just adjacent to the tibial eminences. The bone  wafer was removed. The block was then placed in the distal femoral cut. A  distal femoral cut is made, sizing is placed, and size 3 is most  appropriate. We then placed the size 3 femoral cutting block and made the  posterior cut through the insets of the anterior chamfer cut and then did  the posterior chamfer cut. That block was removed, a size 3 trial was placed  with good fit centrally on the medial condyle. I then did the drill and keel  punch to complete the femoral preparation.   I then placed a size 3  tibia which was the most appropriate size. We pinned  the block and then did the keel cut for the prosthesis. We first used the  bur and then used the cutting device. We then placed the permanent size 3,  9.5 mm tibial component and the permanent size 3 femoral component. She had  full extension, excellent balance throughout and no liftoff in flexion. The  prosthesis then removed, cut bone surfaces are irrigated, cement mixed and  then the tibia and femoral components cemented in place. All extruded cement  is removed. I had a sponge posterior to the tibia to catch any possible  cement back there and all of that is removed. The wound is then copiously  irrigated with antibiotic solution and extensor mechanism closed over a  Hemovac drain with interrupted #1 PDS. Flexion against gravity is 145  degrees. The tourniquet is released with a total time of 61 minutes and  minor bleeding stopped with cautery. The subcu is closed with interrupted 2-  0 Vicryl, subcuticular with running 4-0 Monocryl. The incision is clean and  dry and Steri-Strips and  a bulky sterile dressing applied.                                               Gus Rankin Aluisio, M.D.    FVA/MEDQ  D:  12/04/2001  T:  12/04/2001  Job:  [id number]

## 2010-07-31 NOTE — Op Note (Signed)
[name], [name]             ACCOUNT NO.:  [id number]   MEDICAL RECORD NO.:  [id number]          PATIENT TYPE:  AMB   LOCATION:  DAY                          FACILITY:  [facility]   PHYSICIAN:  Ollen Gross, M.D.    DATE OF BIRTH:  1929-04-19   DATE OF PROCEDURE:  03/23/2006  DATE OF DISCHARGE:                               OPERATIVE REPORT   PREOPERATIVE DIAGNOSIS:  Left hip intractable bursitis with gluteal  tendon tear.   POSTOPERATIVE DIAGNOSIS:  Left hip intractable bursitis with gluteal  tendon tear.   PROCEDURE:  Left hip bursectomy with iliotibial band recession and  gluteal tendon repair.   SURGEON:  Ollen Gross, M.D.   ASSISTANT:  Avel Peace, PA   ANESTHESIA:  General.   BLOOD LOSS:  Minimal.   DRAINS:  None.   COMPLICATION:  None.   CONDITION:  Stable to recovery room.   BRIEF CLINICAL NOTE:  [name] is a 77 year old female with long term  history of significant lateral hip pain.  This been refractory to  operative management including injections.  MRI showed probable gluteal  tendon tear.  She presents now for bursectomy, iliotibial band recession  and gluteal tendon repair.   PROCEDURE IN DETAIL:  After successful administration of general  anesthetic, the patient was placed in right lateral decubitus position  with the left side up and held with the hip positioner.  Left lower  extremity was isolated from perineum with plastic drapes and prepped and  draped in usual sterile fashion.  Incision is made about 4 inches in  length centered at the tip of the greater trochanter.  Skin cut with a  10 blade through subcutaneous tissue to the level of fascia lata.  Fascia lata was incised in line with the skin incision.  There is very  thickened bursa present and this was excised.  I palpated gluteal  tendons and anteriorly the gluteus medius tendon is detached.  We went  through the fibrous tissue and identified the tendon.  We then passed  two  Ethibond sutures through the free edge of the tendon as well as  putting a Mitek anchor into the greater trochanter and passed those  sutures into the tendon.  Tendon is then advanced down to the bone and  oversewn.  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  The wound is then copiously  irrigated with saline solution and the superior portion of the fascia  lata was closed with interrupted #1 Vicryl, up to the musculotendinous  junction.  We then excised a triangular piece of iliotibial band that  would rub on the greater trochanter to prevent any friction over the  trochanter.  The distal part was then closed with interrupted #1 Vicryl.  I injected approximately 30 mL of 0.25% Marcaine with epi into the subcu  tissues as well as the IT band.  Subcu is then closed interrupted #1 Vicryl, interrupted 2-0 Vicryl and  subcuticular running 4-0 Monocryl.  Incisions cleaned and dried and  Steri-Strips and bulky sterile dressing applied.  She is awakened and  transferred to recovery in stable condition.  Ollen Gross, M.D.  Electronically Signed     FA/MEDQ  D:  03/23/2006  T:  03/23/2006  Job:  [id number]

## 2010-07-31 NOTE — Discharge Summary (Signed)
NAME:  GRACIOUS, RENKEN                       ACCOUNT NO.:  [id number]   MEDICAL RECORD NO.:  [id number]                   PATIENT TYPE:  INP   LOCATION:  0455                                 FACILITY:  [facility]   PHYSICIAN:  Ollen Gross, MD                   DATE OF BIRTH:  [birth year]   DATE OF ADMISSION:  12/04/2001  DATE OF DISCHARGE:  12/06/2001                                 DISCHARGE SUMMARY   ADMITTING DIAGNOSES:  1. Medial compartment arthritis left knee.  2. Hypertension.  3. Thoracic aneurysm.   DISCHARGE DIAGNOSES:  1. Left knee medial compartment arthritis status post left knee medial     compartmental replacement.  2. Hypertension.  3. Thoracic aneurysm.   PROCEDURE:  The patient was taken to the OR and underwent a left knee  unicompartmental replacement surgery by Dr. Lequita Halt assisted by Avel Peace, P.A.-C.  Surgery under spinal anesthesia.  Hemovac drain x2.   CONSULTS:  None.   BRIEF HISTORY:  The patient is a 77 year old female who has been seen and  evaluated for ongoing left knee pain.  She has been seen in the office and  found to have isolated medial compartment arthritis of the left knee that  has been ongoing for some time now.  She is very active.  Treated  conservatively in the past for arthritis using anti-inflammatories,  cortisone injections.  Despite conservative measures she continued to  progress with pain.  It is felt she would benefit from undergoing  unicompartmental replacement.  Risks and benefits discussed.  The patient  has elected to proceed with surgery.   LABORATORY DATA:  CBC on admission:  Hemoglobin 11.0, hematocrit 31.9, white  cell count 6.4, red cell count 3.58.  Differential within normal limits with  the exception of eos elevated at 7.  Postoperative H&H 10.3 and 30.1.  Last  noted H&H 10.8 and 32.0.  PT and PTT on admission were 12.9 and 25,  respectively.  INR 0.9.  Last noted PT/INR 13.1 and 0.9.  Chemistry panel on  admission:  Slightly low sodium of 130, elevated BUN of 27.  Remaining  chemistry panel within normal limits.  Follow-up BMET:  Sodium remained  stable at 130.  BUN had come down to normal limits of 13.  Calcium dropped  from 8.8 down to 8.0.  Urinalysis on admission was negative.  Blood group  type AB+.  EKG dated November 30, 2001:  Normal sinus rhythm, left atrial  enlargement, abnormal EKG, septal infarct age undetermined, confirmed by Dr.  Lewayne Bunting.   HOSPITAL COURSE:  The patient was admitted to [facility], taken to  the OR.  Underwent the above stated procedure without complications.  The  patient tolerated procedure sell.  Later was transferred to the recovery  room and then to the orthopedic floor for continued postoperative care.  The  patient was placed on  PCA analgesics for pain control following surgery.  That was discontinued on postoperative day one.  She was given 24 hours  postoperative IV antibiotics.  She was known to have some mild hyponatremia  perioperatively which was likely due to fluid and medications.  Sodium  remained stable.  PT consulted to assist with gait training, ambulation, and  mobility.  The patient did very well with physical therapy, enough to  ambulate approximately 60 feet by postoperative day one and over 200 feet by  postoperative day two.  Hemovac drain placed x3 were pulled on postoperative  day one.  Postoperative day two she was doing quite well.  Incision was  healing well following the dressing change.  She had been up ambulating  extremely well with physical therapy up to approximately 200 feet.  She was  seen in rounds by Dr. Lequita Halt and felt to be doing so well she would be  weaned over to p.o. analgesics and discharged home.   DISCHARGE PLAN:  The patient discharged home on December 06, 2001.   DISCHARGE DIAGNOSES:  Please see above.   DISCHARGE MEDICATIONS:  1. Coumadin.  2. Percocet.  3. Robaxin.   DIET:  Low  sodium diet.   ACTIVITY:  Full weightbearing.  Home health PT and home health nursing  through [facility].   FOLLOW UP:  Next Tuesday or Thursday in the office.  Call for an  appointment.   DISPOSITION:  Home.   CONDITION ON DISCHARGE:  Improved.     [name], P.A.              Ollen Gross, MD    ALP/MEDQ  D:  01/03/2002  T:  01/03/2002  Job:  [id number]

## 2010-07-31 NOTE — Op Note (Signed)
NAME:  [name], [name]                       ACCOUNT NO.:  [id number]   MEDICAL RECORD NO.:  [id number]                   PATIENT TYPE:  AMB   LOCATION:  DSC                                  FACILITY:  [facility]   PHYSICIAN:  Leonides Grills, M.D.                  DATE OF BIRTH:  Sep 26, 1929   DATE OF PROCEDURE:  08/07/2002  DATE OF DISCHARGE:                                 OPERATIVE REPORT   PREOPERATIVE DIAGNOSES:  1. Right hallux varus.  2. Right dorsal bunion.  3. Right great toe interphalangeal joint contracture.  4. Complication of hardware deep right foot.  5. Right extensor hallucis longus contracture.   POSTOPERATIVE DIAGNOSES:  1. Right hallux varus.  2. Right dorsal bunion.  3. Right great toe interphalangeal joint contracture.  4. Complication of hardware deep right foot.  5. Right extensor hallucis longus contracture plus right extensor hallucis     longus tendon benign soft tissue lesion consistent with synovial cyst.   OPERATIONS PERFORMED:  1. Right extensor hallucis longus lengthening.  2. Right extensor hallucis longus to proximal phalanx transfer.  3. Right flexor hallucis longus to proximal phalanx transfer.  4. Right plantar, right great toe interphalangeal joint release.  5. Right dorsal and medial great toe metatarsophalangeal joint release.  6. Right great toe hardware removal deep.  7. Right benign deep soft tissue lesion excision.  8. Excision of proximal phalanx spur.   SURGEON:  Leonides Grills, M.D.   ASSISTANT:  Lianne Cure, P.A.   ANESTHESIA:  General endotracheal tube.   ESTIMATED BLOOD LOSS:  Minimal.   TOURNIQUET TIME:  Approximately an hour and a half.   COMPLICATIONS:  None.   DISPOSITION:  Stable to the PR.   INDICATIONS FOR PROCEDURE:  The patient is a 77 year old female who  underwent a chevron bunionectomy and later sustained the above deformity  which was painful and interfering with her life to where she could not do  what  she wanted to do.  She tried all different types of shoe wear without  improvement.  She was consented for the above procedure.  All risks which  include infection, neurovascular injury, recurrence of deformity, pain,  stiffness, arthritis, possible future surgery, i.e. arthrodesis or revision,  weakness of the toe, were all explained, questions were encouraged and  answered.   DESCRIPTION OF PROCEDURE:  The patient was brought to the operating room and  placed in supine position after adequate general endotracheal tube  anesthesia was administered as well as Ancef 1g IV piggyback.  The right  lower extremity was then prepped and draped in a sterile manner over a  proximally placed thigh tourniquet.  The limb was gravity exsanguinated and  the tourniquet was elevated to 290 mmHg.  A dorsal longitudinal incision was  then made over the EHL tendon which was bowstringed. Dissection was carried  down through skin.  The EHL tendon had  no distinct tendon sheath and there  was no extensor hallucis brevis identifiable.  This was extensively  dissected out without identifying the extensor hallucis brevis which we  initially used to transfer to help with the hallux varus deformity.  Because  of this and because of the EHL contracture, we had to divide the EHL tendon  into thirds with the lateral third being used for the hallux varus  correction transfer and the Z-lengthening performed to treat the EHL  contracture.  Once this was performed and the medial two limbs of EHL were  retracted out of harm's way, we were then able to identify the K-wire.  The  K-wire was then pulled out with needle nose pliers.  This was intact.  No  evidence of hardware failure.  We then identified the great toe MTP joint.  We performed a dorsal capsulotomy through this area.  We then dissected  lateral to the great toe and identified the transverse metatarsal ligament.  There was extensive relief in this area.  This was  the only structure we  could find that we could run the EHL tendon under.  Once this was dissected  out, we then extended the incision distally and performed the excision of  the dorsal spur of the proximal phalanx.  There was a large amount of  synovitis over the medial and lateral aspects of the interphalangeal joint.  However, due to the proximity of the neurovascular bundle we did not do  extensive removal of the false bursa areas and will let this heal on its own  after the deformity has been corrected.  We then made a plantar incision  within the crease of the IP joint.  Dissection was carried down through skin  neurovascular structures protected both medially and laterally.  The FHL  tendon was then identified.  This was then tenotomized.  Once this was  retracted out of harm's way, we then performed an extensive IP joint  release.  Once this was done we were able to reduce the IP joint.  With the  IP joint held reduced, we then fired an antegrade 2-0 K-wire from distal  phalanx across the IP joint with it held reduced into the proximal phalanx  and stopped.  We then made a longitudinal incision midline over the medial  aspect of the great toe MTP joint.  Dissection was carried down through the  skin.  Neurovascular structures protected both superiorly and inferiorly.  We then performed a medial capsulotomy of the MTP joint.  Once this was  extensively released, we then identified the FHL tendon and plantar aspect  of the base of the proximal phalanx and pulled this through the wound.  We  then drilled a drill hole as close to the base of the proximal phalanx as  possible from medial dorsal to plantar lateral for the lateral limb of the  EHL tendon.  Once this was done, we then placed a [place] suture passer  through this area.  Then we used 2-0 FiberWire and sutured this to the end  of the lateral aspect of the EHL tendon.  This was then transferred underneath the plantar aspect of  the transverse metatarsal ligament and then  through the drill hole respectively using the suture passer.  Once this was  done, we then placed tension on the limb with the toe held reduced.  We then  anchored the tendon to the medial capsule respectively with the 2-0  FiberWire.  This held  the toe in excellent alignment.  We then drilled just  distal to this in the base of the proximal phalanx a hole from dorsal  lateral to plantar medial.  We then placed a suture passer through it here  and then placed a 2-0 FiberWire sewed into the end of the FHL tendon.  This  was then pulled from plantar to dorsal.  With the ankle in neutral  dorsiflexion and tension placed on the FHL tendon, we then fired the  2-0 K-  wire across the MTP joint with the toe held in reduced position.  Once this  was done, we then transferred the EHL distal limb to the FHL dorsally and  then the EHL proximal limb to this construct as well with 2-0 FiberWire.  The remaining portion of the tendon transfer was completed with 3-0 PDS.  The wound was then copiously irrigated with normal saline.  Also of note,  when we were dissecting out the EHL tendon proximally, we encountered a deep  soft tissue lesion within the EHL sheath itself.  This was meticulously  dissected out and resected, sent to pathology.  It was consistent with a  ganglion cyst.  The wound was copiously irrigated with normal saline.  Tourniquet was deflated.  Hemostasis was obtained.  The toe pinked up  nicely.  Capillary refill less than two  seconds.  Skin relieving incisions were made on either side of the K-wire.  The K-wire was bent, cut and capped.  Deep tissues were closed with 3-0  Vicryl.  Skin was closed with 4-0 nylon.  Sterile dressing was applied.  Cam  walker boot was applied.  The patient was stable to the PR.                                                Leonides Grills, M.D.    PB/MEDQ  D:  08/07/2002  T:  08/07/2002  Job:  [id number]

## 2010-08-17 ENCOUNTER — Other Ambulatory Visit: Payer: Self-pay | Admitting: Orthopedic Surgery

## 2010-08-17 ENCOUNTER — Encounter (HOSPITAL_COMMUNITY): Payer: Medicare Other

## 2010-08-17 LAB — CBC
HCT: 33.2 % — ABNORMAL LOW (ref 36.0–46.0)
Hemoglobin: 10.9 g/dL — ABNORMAL LOW (ref 12.0–15.0)
MCH: 29.8 pg (ref 26.0–34.0)
MCHC: 32.8 g/dL (ref 30.0–36.0)
MCV: 90.7 fL (ref 78.0–100.0)
Platelets: 216 10*3/uL (ref 150–400)
RBC: 3.66 MIL/uL — ABNORMAL LOW (ref 3.87–5.11)
RDW: 12.9 % (ref 11.5–15.5)
WBC: 6.8 10*3/uL (ref 4.0–10.5)

## 2010-08-17 LAB — COMPREHENSIVE METABOLIC PANEL
ALT: 27 U/L (ref 0–35)
AST: 31 U/L (ref 0–37)
Albumin: 3.8 g/dL (ref 3.5–5.2)
Alkaline Phosphatase: 60 U/L (ref 39–117)
BUN: 31 mg/dL — ABNORMAL HIGH (ref 6–23)
CO2: 28 mEq/L (ref 19–32)
Calcium: 9.1 mg/dL (ref 8.4–10.5)
Chloride: 99 mEq/L (ref 96–112)
Creatinine, Ser: 1.1 mg/dL (ref 0.4–1.2)
GFR calc Af Amer: 58 mL/min — ABNORMAL LOW (ref >60)
GFR calc non Af Amer: 48 mL/min — ABNORMAL LOW (ref >60)
Glucose, Bld: 89 mg/dL (ref 70–99)
Potassium: 5.2 mEq/L — ABNORMAL HIGH (ref 3.5–5.1)
Sodium: 135 mEq/L (ref 135–145)
Total Bilirubin: 0.3 mg/dL (ref 0.3–1.2)
Total Protein: 7.2 g/dL (ref 6.0–8.3)

## 2010-08-17 LAB — URINALYSIS, ROUTINE W REFLEX MICROSCOPIC
Bilirubin Urine: NEGATIVE
Glucose, UA: NEGATIVE mg/dL
Hgb urine dipstick: NEGATIVE
Ketones, ur: NEGATIVE mg/dL
Nitrite: NEGATIVE
Protein, ur: NEGATIVE mg/dL
Specific Gravity, Urine: 1.023 (ref 1.005–1.030)
Urobilinogen, UA: 0.2 mg/dL (ref 0.0–1.0)
pH: 5.5 (ref 5.0–8.0)

## 2010-08-17 LAB — APTT: aPTT: 27 seconds (ref 24–37)

## 2010-08-17 LAB — SURGICAL PCR SCREEN
MRSA, PCR: NEGATIVE
Staphylococcus aureus: NEGATIVE

## 2010-08-17 LAB — PROTIME-INR
INR: 1.02 (ref 0.00–1.49)
Prothrombin Time: 13.6 seconds (ref 11.6–15.2)

## 2010-08-24 ENCOUNTER — Inpatient Hospital Stay (HOSPITAL_COMMUNITY)
Admission: RE | Admit: 2010-08-24 | Discharge: 2010-08-28 | DRG: 468 | Disposition: A | Discharge status: Skilled Nursing Facility | Payer: Medicare Other | Source: Ambulatory Visit | Attending: Orthopedic Surgery | Admitting: Orthopedic Surgery

## 2010-08-24 DIAGNOSIS — M1710 Unilateral primary osteoarthritis, unspecified knee: Secondary | ICD-10-CM | POA: Diagnosis present

## 2010-08-24 DIAGNOSIS — T84099A Other mechanical complication of unspecified internal joint prosthesis, initial encounter: Principal | ICD-10-CM | POA: Diagnosis present

## 2010-08-24 DIAGNOSIS — Z96659 Presence of unspecified artificial knee joint: Secondary | ICD-10-CM

## 2010-08-24 DIAGNOSIS — Z96649 Presence of unspecified artificial hip joint: Secondary | ICD-10-CM

## 2010-08-24 DIAGNOSIS — D649 Anemia, unspecified: Secondary | ICD-10-CM | POA: Diagnosis not present

## 2010-08-24 DIAGNOSIS — H3530 Unspecified macular degeneration: Secondary | ICD-10-CM | POA: Diagnosis present

## 2010-08-24 DIAGNOSIS — Y831 Surgical operation with implant of artificial internal device as the cause of abnormal reaction of the patient, or of later complication, without mention of misadventure at the time of the procedure: Secondary | ICD-10-CM | POA: Diagnosis present

## 2010-08-24 DIAGNOSIS — I10 Essential (primary) hypertension: Secondary | ICD-10-CM | POA: Diagnosis present

## 2010-08-24 DIAGNOSIS — Z01812 Encounter for preprocedural laboratory examination: Secondary | ICD-10-CM

## 2010-08-24 DIAGNOSIS — I4891 Unspecified atrial fibrillation: Secondary | ICD-10-CM | POA: Diagnosis present

## 2010-08-25 LAB — BASIC METABOLIC PANEL
BUN: 25 mg/dL — ABNORMAL HIGH (ref 6–23)
CO2: 27 mEq/L (ref 19–32)
Calcium: 8 mg/dL — ABNORMAL LOW (ref 8.4–10.5)
Chloride: 101 mEq/L (ref 96–112)
Creatinine, Ser: 0.89 mg/dL (ref 0.4–1.2)
GFR calc Af Amer: >60 mL/min (ref >60)
GFR calc non Af Amer: >60 mL/min (ref >60)
Glucose, Bld: 131 mg/dL — ABNORMAL HIGH (ref 70–99)
Potassium: 4.3 mEq/L (ref 3.5–5.1)
Sodium: 134 mEq/L — ABNORMAL LOW (ref 135–145)

## 2010-08-25 LAB — CBC
HCT: 24.8 % — ABNORMAL LOW (ref 36.0–46.0)
Hemoglobin: 8.3 g/dL — ABNORMAL LOW (ref 12.0–15.0)
MCH: 30.3 pg (ref 26.0–34.0)
MCHC: 33.5 g/dL (ref 30.0–36.0)
MCV: 90.5 fL (ref 78.0–100.0)
Platelets: 159 10*3/uL (ref 150–400)
RBC: 2.74 MIL/uL — ABNORMAL LOW (ref 3.87–5.11)
RDW: 13.1 % (ref 11.5–15.5)
WBC: 7.5 10*3/uL (ref 4.0–10.5)

## 2010-08-25 LAB — PROTIME-INR
INR: 1.14 (ref 0.00–1.49)
Prothrombin Time: 14.8 seconds (ref 11.6–15.2)

## 2010-08-26 LAB — BASIC METABOLIC PANEL
BUN: 24 mg/dL — ABNORMAL HIGH (ref 6–23)
CO2: 25 mEq/L (ref 19–32)
Calcium: 7.9 mg/dL — ABNORMAL LOW (ref 8.4–10.5)
Chloride: 98 mEq/L (ref 96–112)
Creatinine, Ser: 0.98 mg/dL (ref 0.4–1.2)
GFR calc Af Amer: >60 mL/min (ref >60)
GFR calc non Af Amer: 55 mL/min — ABNORMAL LOW (ref >60)
Glucose, Bld: 116 mg/dL — ABNORMAL HIGH (ref 70–99)
Potassium: 4.6 mEq/L (ref 3.5–5.1)
Sodium: 129 mEq/L — ABNORMAL LOW (ref 135–145)

## 2010-08-26 LAB — CBC
HCT: 20.7 % — ABNORMAL LOW (ref 36.0–46.0)
Hemoglobin: 9.7 g/dL — ABNORMAL LOW (ref 12.0–15.0)
MCH: 29.9 pg (ref 26.0–34.0)
MCHC: 33.3 g/dL (ref 30.0–36.0)
MCV: 89.6 fL (ref 78.0–100.0)
Platelets: 162 10*3/uL (ref 150–400)
RBC: 2.31 MIL/uL — ABNORMAL LOW (ref 3.87–5.11)
RDW: 13.1 % (ref 11.5–15.5)
WBC: 8.1 10*3/uL (ref 4.0–10.5)

## 2010-08-26 LAB — PROTIME-INR
INR: 1.24 (ref 0.00–1.49)
Prothrombin Time: 15.8 seconds — ABNORMAL HIGH (ref 11.6–15.2)

## 2010-08-26 NOTE — Op Note (Signed)
NAMEELONI, [name] NO.:  [id number]  MEDICAL RECORD NO.:  [id number]  LOCATION:  1601                         FACILITY:  [facility]  PHYSICIAN:  Ollen Gross, M.D.    DATE OF BIRTH:  09/04/1929  DATE OF PROCEDURE:  08/24/2010 DATE OF DISCHARGE:                              OPERATIVE REPORT   PREOPERATIVE DIAGNOSIS:  Failed left knee unicompartmental replacement.  POSTOPERATIVE DIAGNOSIS:  Failed left knee unicompartmental replacement.  PROCEDURE:  Revision of left knee unicompartmental replacement total knee arthroplasty.  SURGEON:  Ollen Gross, M.D.  ASSISTANT:  Rozell Searing, PA-C  ANESTHESIA:  Spinal.  ESTIMATED BLOOD LOSS:  Minimal.  DRAINS:  Hemovac x1.  TOURNIQUET TIME:  58 minutes at 300 mmHg.  COMPLICATIONS:  None.  CONDITION:  Stable to recovery.  BRIEF CLINICAL NOTE:  Ms. Aoun is a 77 year old female, who had a left knee medial unicompartmental arthroplasty done many years ago.  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  She responded temporarily to the injection, but now has had progressively worsening pain and dysfunction. She presents now for revision to a total knee arthroplasty.  PROCEDURE IN DETAIL:  After successful administration of spinal anesthetic, a tourniquet was placed on her left and her left lower extremity is prepped and draped in usual sterile fashion.  Extremities wrapped in Esmarch, knee flexed, tourniquet inflated to 300 mmHg. Midline incision was made with a 10 blade through subcutaneous tissue to the level of the extensor mechanism.  A fresh blade is used to make a medial parapatellar arthrotomy.  We did not encounter any fluid in the joint.  Soft tissue over the proximal medial tibia subperiosteally elevated to the joint line with the knife and into the semimembranosus bursa with a Cobb elevator.  Soft tissue laterally is elevated with attention  being paid to avoid the patellar tendon on tibial tubercle. Patella was everted, knee flexed 90 degrees.  ACL was already gone.  I removed the PCL.  The components of the unicompartmental replacement were in good position and do not appear loose.  I then used a drill the create a starting hole in the distal femur.  The canal was thoroughly irrigated.  The 5-degree left valgus alignment guide is placed.  The distal femoral cutting block is then pinned to remove 11 mm off the distal femur.  I resected the lateral side and started with the resection.  I did get behind the replacement.  I then used a osteotome to remove the femoral component of the unicompartmental replacement.  We had minimal bone loss at this.  I then completed the distal femoral cut.  The tibia was then subluxed forward and the lateral meniscus removed. There was degeneration on the lateral side.  The extramedullary tibial alignment guide is placed referencing proximally at the medial aspect of the tibial tubercle and distally along the second metatarsal axis and tibial crest.  I resected to remove about 8 mm off the lateral side. Resection is made with an oscillating saw.  I then disrupted the interface between the medial component and the bone.  This was removed. In order to get to the base  of this, I have to resect about 8 mm.  I then resected down below the base and I was going to replace this with a 10-mm augment.  I resected about 10 mm to get down to normal bone.  We then reamed up to 13 mm for placement of 13 mm stem.  Size four is the most appropriate tibial component.  We prepared the proximal tibia with the modular drill and a keel punch for the size four.  I also prepared for 29 mm sleeve to control rotation.  We then placed the trial tibial component, which is a size 4 MBT revision tray with a 13 x 30 stem extension, 10 mm augment medially and a 29 sleeve.  I impacted this and then placed a keel punch with  fantastic fit on the bone.  We then went back to the femoral side.  Sizing guide is placed, size four is most appropriate.  Rotation is marked at the epicondylar axis and confirmed by creating rectangular flexion gap at 90 degrees.  The block is pinned in this rotation.  The anterior-posterior chamfer cuts are made.  Intercondylar blocks placed and that cuts made.  There is minimal if any bone loss on the medial side, thus we do not need to use any augments medially.  Trial size four posterior stabilized femur was placed.  A 10-mm posterior stabilized rotating platform insert trial was placed with a 10 full extension achieved with excellent varus-valgus and anterior-posterior balance throughout full range of motion.  The patella was everted and this measured 22 mm.  Freehand resection was taken to 13 mm, 35 template is placed, lug holes were drilled, trial patella was placed and it tracks normally.  Osteophytes removed off the posterior femur with the trial in place.  All trials were removed.  I then sized for a cement restrictor for the tibia and size four is most appropriate. The restrictor is placed in the appropriate depth in the canal.  The cutbone surfaces were then prepared with pulsatile lavage once cement was mixed.  Components were ready along with cement on the back table, which is a size 4 MBT revision tray with a 10-mm medial augment, a 29 sleeve and 13 x 30 stem extension.  Once the cement was ready, I then injected it into the tibial canal.  This was the gentamicin impregnated cement. Tibial components impacted and all extruded cement removed.  On the femur we just cemented distally, impacted and extruded cement removed. The 10-mm inserts placed, knee held in full extension and the remainder of the extruded cement was removed.  The 35 patella was placed and cemented into place and held with a clamp.  Once the cement fully hardened then the permanent 10-mm posterior  stabilized rotating platform insert is placed in the tibial tray.  This cut was irrigated with saline solution and the arthrotomy closed over Hemovac drain with interrupted #1 PDS.  Flexion against gravity is to 135 degrees.  Patella tracks normally.  Tourniquet was released, total time of 58 minutes.  Subcu was closed interrupted with 2-0 Vicryl and subcuticular running 4-0 Monocryl.  The incision was then cleaned and dried and the catheter for Marcaine pain pump was placed with the pump being initiated.  Steri- Strips and bulky sterile dressing were applied and she is placed into a knee immobilizer, awakened and transferred to recovery in stable condition.     Ollen Gross, M.D.     FA/MEDQ  D:  08/24/2010  T:  08/24/2010  Job:  [id number]  Electronically Signed by Ollen Gross M.D. on 08/26/2010 03:25:09 PM

## 2010-08-27 LAB — CBC
HCT: 18.9 % — ABNORMAL LOW (ref 36.0–46.0)
HCT: 19.2 % — ABNORMAL LOW (ref 36.0–46.0)
Hemoglobin: 6.4 g/dL — CL (ref 12.0–15.0)
Hemoglobin: 6.6 g/dL — CL (ref 12.0–15.0)
MCH: 29.8 pg (ref 26.0–34.0)
MCH: 30.3 pg (ref 26.0–34.0)
MCHC: 33.9 g/dL (ref 30.0–36.0)
MCHC: 34.4 g/dL (ref 30.0–36.0)
MCV: 87.9 fL (ref 78.0–100.0)
MCV: 88.1 fL (ref 78.0–100.0)
Platelets: 142 10*3/uL — ABNORMAL LOW (ref 150–400)
Platelets: 149 10*3/uL — ABNORMAL LOW (ref 150–400)
RBC: 2.15 MIL/uL — ABNORMAL LOW (ref 3.87–5.11)
RBC: 2.18 MIL/uL — ABNORMAL LOW (ref 3.87–5.11)
RDW: 12.9 % (ref 11.5–15.5)
RDW: 12.9 % (ref 11.5–15.5)
WBC: 7 10*3/uL (ref 4.0–10.5)
WBC: 7 10*3/uL (ref 4.0–10.5)

## 2010-08-27 LAB — BASIC METABOLIC PANEL
BUN: 19 mg/dL (ref 6–23)
CO2: 24 mEq/L (ref 19–32)
Calcium: 8.3 mg/dL — ABNORMAL LOW (ref 8.4–10.5)
Chloride: 98 mEq/L (ref 96–112)
Creatinine, Ser: 0.97 mg/dL (ref 0.4–1.2)
GFR calc Af Amer: >60 mL/min (ref >60)
GFR calc non Af Amer: 56 mL/min — ABNORMAL LOW (ref >60)
Glucose, Bld: 97 mg/dL (ref 70–99)
Potassium: 4.2 mEq/L (ref 3.5–5.1)
Sodium: 129 mEq/L — ABNORMAL LOW (ref 135–145)

## 2010-08-27 LAB — PREPARE RBC (CROSSMATCH)

## 2010-08-27 LAB — PROTIME-INR
INR: 1.8 — ABNORMAL HIGH (ref 0.00–1.49)
Prothrombin Time: 21.1 seconds — ABNORMAL HIGH (ref 11.6–15.2)

## 2010-08-28 LAB — CBC
HCT: 22.7 % — ABNORMAL LOW (ref 36.0–46.0)
Hemoglobin: 7.9 g/dL — ABNORMAL LOW (ref 12.0–15.0)
MCH: 30.6 pg (ref 26.0–34.0)
MCHC: 34.8 g/dL (ref 30.0–36.0)
MCV: 88 fL (ref 78.0–100.0)
Platelets: 146 10*3/uL — ABNORMAL LOW (ref 150–400)
RBC: 2.58 MIL/uL — ABNORMAL LOW (ref 3.87–5.11)
RDW: 14.1 % (ref 11.5–15.5)
WBC: 7.4 10*3/uL (ref 4.0–10.5)

## 2010-08-28 LAB — TYPE AND SCREEN
ABO/RH(D): AB POS
Antibody Screen: NEGATIVE
Unit division: 0
Unit division: 0

## 2010-08-28 LAB — PROTIME-INR
INR: 1.76 — ABNORMAL HIGH (ref 0.00–1.49)
Prothrombin Time: 20.8 seconds — ABNORMAL HIGH (ref 11.6–15.2)

## 2010-09-06 ENCOUNTER — Emergency Department (HOSPITAL_COMMUNITY): Payer: Medicare Other

## 2010-09-06 ENCOUNTER — Observation Stay (HOSPITAL_COMMUNITY)
Admission: EM | Admit: 2010-09-06 | Discharge: 2010-09-07 | Disposition: A | Discharge status: Home / Self Care | Payer: Medicare Other | Attending: Emergency Medicine | Admitting: Emergency Medicine

## 2010-09-06 DIAGNOSIS — T8140XA Infection following a procedure, unspecified, initial encounter: Principal | ICD-10-CM | POA: Insufficient documentation

## 2010-09-06 DIAGNOSIS — G8918 Other acute postprocedural pain: Secondary | ICD-10-CM | POA: Insufficient documentation

## 2010-09-06 DIAGNOSIS — L03119 Cellulitis of unspecified part of limb: Secondary | ICD-10-CM | POA: Insufficient documentation

## 2010-09-06 DIAGNOSIS — Z79899 Other long term (current) drug therapy: Secondary | ICD-10-CM | POA: Insufficient documentation

## 2010-09-06 DIAGNOSIS — M7989 Other specified soft tissue disorders: Secondary | ICD-10-CM | POA: Insufficient documentation

## 2010-09-06 DIAGNOSIS — Z96659 Presence of unspecified artificial knee joint: Secondary | ICD-10-CM | POA: Insufficient documentation

## 2010-09-06 DIAGNOSIS — L02419 Cutaneous abscess of limb, unspecified: Secondary | ICD-10-CM | POA: Insufficient documentation

## 2010-09-06 LAB — POCT I-STAT, CHEM 8
BUN: 38 mg/dL — ABNORMAL HIGH (ref 6–23)
Calcium, Ion: 1.13 mmol/L (ref 1.12–1.32)
Chloride: 101 mEq/L (ref 96–112)
Creatinine, Ser: 1.5 mg/dL — ABNORMAL HIGH (ref 0.50–1.10)
Glucose, Bld: 105 mg/dL — ABNORMAL HIGH (ref 70–99)
HCT: 31 % — ABNORMAL LOW (ref 36.0–46.0)
Hemoglobin: 10.5 g/dL — ABNORMAL LOW (ref 12.0–15.0)
Potassium: 4 mEq/L (ref 3.5–5.1)
Sodium: 133 mEq/L — ABNORMAL LOW (ref 135–145)
TCO2: 24 mmol/L (ref 0–100)

## 2010-09-06 LAB — PROTIME-INR
INR: 1.55 — ABNORMAL HIGH (ref 0.00–1.49)
Prothrombin Time: 18.9 seconds — ABNORMAL HIGH (ref 11.6–15.2)

## 2010-09-06 LAB — CBC
HCT: 28.4 % — ABNORMAL LOW (ref 36.0–46.0)
Hemoglobin: 9.7 g/dL — ABNORMAL LOW (ref 12.0–15.0)
MCH: 30.7 pg (ref 26.0–34.0)
MCHC: 34.2 g/dL (ref 30.0–36.0)
MCV: 89.9 fL (ref 78.0–100.0)
Platelets: 353 10*3/uL (ref 150–400)
RBC: 3.16 MIL/uL — ABNORMAL LOW (ref 3.87–5.11)
RDW: 13.6 % (ref 11.5–15.5)
WBC: 9.1 10*3/uL (ref 4.0–10.5)

## 2010-09-06 LAB — DIFFERENTIAL
Basophils Absolute: 0.1 10*3/uL (ref 0.0–0.1)
Basophils Relative: 2 % — ABNORMAL HIGH (ref 0–1)
Eosinophils Absolute: 1.1 10*3/uL — ABNORMAL HIGH (ref 0.0–0.7)
Eosinophils Relative: 12 % — ABNORMAL HIGH (ref 0–5)
Lymphocytes Relative: 30 % (ref 12–46)
Lymphs Abs: 2.7 10*3/uL (ref 0.7–4.0)
Monocytes Absolute: 1 10*3/uL (ref 0.1–1.0)
Monocytes Relative: 11 % (ref 3–12)
Neutro Abs: 4.2 10*3/uL (ref 1.7–7.7)
Neutrophils Relative %: 46 % (ref 43–77)

## 2010-09-06 LAB — APTT: aPTT: 31 seconds (ref 24–37)

## 2010-09-07 NOTE — Discharge Summary (Signed)
[name], [name]             ACCOUNT NO.:  [id number]  MEDICAL RECORD NO.:  [id number]  LOCATION:  1601                         FACILITY:  [facility]  PHYSICIAN:  Ollen Gross, M.D.    DATE OF BIRTH:  [birth year]  DATE OF ADMISSION:  08/24/2010 DATE OF DISCHARGE:  08/28/2010                        DISCHARGE SUMMARY - REFERRING   DISCHARGE DIAGNOSIS:  Failed left unicompartmental knee replacement.  OTHER DIAGNOSES: 1. Hypertension. 2. History of atrial fibrillation. 3. History of bronchitis. 4. Macular degeneration. 5. Cataracts. 6. Irritable bowel syndrome. 7. Degenerative disk disease.  PROCEDURES: 1. Revision of left knee unicompartmental replacement to total knee     arthroplasty by Dr. Lequita Halt on August 24, 2010. 2. Transfusion of 2 units packed red blood cells on August 27, 2010.  HOSPITAL COURSE:  [name] was admitted to [facility] via the operating room on August 24, 2010, at which time she underwent revision of a failed unicompartmental replacement to her left total knee.  Procedure was tolerated well without complications.  In the recovery room, she had intact neurovascular function.  She was transported to the orthopedic floor.  Postoperative day #1, her morphine PCA was discontinued.  She was afebrile with stable vital signs.  She remained neurovascularly intact throughout the entire hospital stay. Hemovac drain was discontinued on postop day #1.  Hemoglobin was 8.3. Her preoperative hemoglobin was 10.9.  BMET was normal on postop day #1. Sodium 134, potassium 4.3, chloride 101, CO2 of 27, BUN 25, creatinine 0.8, glucose 131.  She began working with physical therapy, getting out of bed to chair.  On postoperative #2, she felt much better.  She was able to ambulate 30 feet with physical therapy.  Her dressing was changed and incision was cleaned without erythema or exudate.  Her hemoglobin increased to 9.7 which in retrospect is probably an  incorrect value.  Sodium was 129.  Otherwise, her BMET was within normal limits. Postop day #3, it was noted that she had a hemoglobin of 6.4 which on repeat was only 6.6.  She underwent transfusion of 2 units of packed red blood cells.  She never had any hemodynamic compromise from this.  She was essentially asymptomatic.  Hemoglobin went up to 7.9 as of day of discharge.  She is on Coumadin for DVT prophylaxis.  Most recent INR 1.76.  Coumadin dosing has been 5 mg, 5 mg, 7.5 mg and 2.5 mg.  As of the day of discharge she is tolerating a regular diet, in stable condition and doing very well with physical therapy.  DISCHARGE MEDICATIONS: 1. Coumadin per pharmacy protocol.  She will be on Coumadin for total     of 3 weeks postop.  Most recent INR 1.76.  Coumadin doses to date     7.5 mg, 5 mg, 5 mg and 2.5 mg respectively on the first 4 days.     The dose should be adjusted to keep her INR between 1.8 and 3.0.     She will need monitoring of the INR while on Coumadin. 2. Oxycodone IR 5 mg 1 to 2 tablets every 4 to 6 hours as needed for     pain. 3. Robaxin 500 mg 1  tablet every 6 hours as needed for pain. 4. Carvedilol 12.5 mg 1 tablet p.o. b.i.d. 5. Diltiazem CD/XT 240 mg 1 tablet p.o. q.p.m. 6. Diovan 160 mg 1/2 tablet p.o. at noon if her blood pressure is     elevated. 7. Hydroxyzine 10 mg 1/2 tablet p.o. q.h.s. 8. PreserVision 1 tablet p.o. b.i.d.  DISPOSITION:  To be transferred to [facility] on August 28, 2010, in stable condition, tolerating a regular diet.  Follow up will be with Dr. Lequita Halt on September 08, 2010, or September 10, 2010. Please call [phone] to arrange followup.  The patient may shower.  She will need a daily dressing change.  She can shower without the dressing on.  Any questions or concerns, please call Dr. Deri Fuelling office at [phone].     Ollen Gross, M.D.     FA/MEDQ  D:  08/28/2010  T:  08/28/2010  Job:  [id number]  Electronically  Signed by Ollen Gross M.D. on 09/07/2010 05:02:15 PM

## 2010-09-07 NOTE — H&P (Signed)
[name], OHALLORAN             ACCOUNT NO.:  [id number]  MEDICAL RECORD NO.:  [id number]  LOCATION:                                 FACILITY:  PHYSICIAN:  Ollen Gross, M.D.    DATE OF BIRTH:  Feb 11, 1930  DATE OF ADMISSION:  08/24/2010 DATE OF DISCHARGE:                             HISTORY & PHYSICAL   CHIEF COMPLAINT:  Left knee pain.  HISTORY OF PRESENT ILLNESS:  The patient is a 77 year old female who has had a prior history of left knee unicompartment replacement.  Left knee has gone on to develop pain with end-stage arthritis surrounding any other compartment.  It was felt she would benefit from undergoing surgical intervention.  Risks and benefits have been discussed.  She elects to proceed with surgery.  ALLERGIES:  SULFA, CIPRO, KEFLEX, SHELLFISH.  CURRENT MEDICATIONS:  Carvedilol, Diovan, Mobic.  She is on hormone replacements, diltiazem, vitamin C, vitamin D, glucosamine chondroitin, multivitamin, fish oil, vitamin E, calcium, baby aspirin, hydralazine, primrose oil, PreserVision.  PAST MEDICAL HISTORY:  Macular degeneration, cataracts, history of bronchitis, hypertension, heart murmur, history of atrial fibrillation, irritable bowel syndrome, slight urinary incontinence, past history of blood transfusions, degenerative disk disease, history of bursitis postmenopausal.  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  Mother deceased age 17, complications of stroke.  SOCIAL HISTORY:  Married, retired.  Does have a caregiver lined up.  She has a living will healthcare power of attorney.  REVIEW OF SYSTEMS:  GENERAL:  No fevers,  chills, night sweats.  NEURO: No seizures, syncope, or paralysis.  RESPIRATORY: No shortness breath, productive cough or hemoptysis.  CARDIOVASCULAR:  No chest pain, angina, or orthopnea.  GI: No nausea, vomiting, diarrhea, constipation.  GU: Little bit slight incontinence.  No dysuria, hematuria. MUSCULOSKELETAL: Left knee.  PHYSICAL EXAMINATION:  VITAL SIGNS:  Pulse 64, respirations 12, blood pressure 142/78. GENERAL:  A 77 year old white female, well nourished, well developed, no acute distress.  She is alert, oriented, cooperative. HEENT: Normocephalic, atraumatic.  Pupils round and reactive.  EOMs intact. NECK:  Supple. CHEST:  Clear. HEART:  Regular rate and rhythm with a faint systolic ejection murmur on the left sternal border. ABDOMEN:  Soft, nontender.  Bowel sounds present. RECTAL:  Not done, not pertinent to present illness. BREASTS:  Not done, not pertinent to present illness. GENITALIA:  Not done, not pertinent to present illness. EXTREMITIES:  Left knee, no effusion or crepitus, mild instability.  IMPRESSION:  Failed left uni-knee replacement.  PLAN:  Will undergo revision and conversion over to a total knee arthroplasty.  Surgery will be performed by Dr. Ollen Gross.     [name], P.A.C.   ______________________________ Ollen Gross, M.D.    ALP/MEDQ  D:  08/22/2010  T:  08/22/2010  Job:  [id number]  cc:   Deirdre Peer. Polite, M.D.  Dr. Smitty Cords  Electronically Signed by Patrica Duel P.A.C. on 09/02/2010 07:14:09 AM Electronically  Signed by Ollen Gross M.D. on 09/07/2010 05:02:13 PM

## 2010-09-15 NOTE — Consult Note (Signed)
  Darlene, Wall NO.:  [id number]  MEDICAL RECORD NO.:  [id number]  LOCATION:  1858                         FACILITY:  [facility]  PHYSICIAN:  Alvy Beal, MD    DATE OF BIRTH:  Jul 11, 1929  DATE OF CONSULTATION:  09/07/2010 DATE OF DISCHARGE:  09/07/2010                                CONSULTATION   REASON FOR CONSULTATION:  Left knee pain status post knee replacement.  [name] is a very pleasant 77 year old woman who is 2 weeks status post a left total knee replacement by my partner Dr. Sherlean Foot.  I was on-call Sunday evening and she contacted me because of increasing pain, swelling, and redness.  As a result, I recommended she come to the emergency room for further evaluation.  The patient did present to the ER and I was notified.  At this present time, she states that she has had no fevers or chills but redness and increased pain.  She currently reports twisting the knee on Friday and that is when things took a turn for the worse.  PAST MEDICAL/SURGICAL/FAMILY/SOCIAL HISTORY:  She is allergic to SULFA, CIPRO, KEFLEX, SHELL FISH.  She is on carvedilol, Diovan, Mobic, hormone replacement, diltiazem, multivitamins, baby aspirin, hydralazine, primrose oil.  Medical history includes macular degeneration, cataracts, bronchitis, hypertension, heart murmur, AFib, irritable bowel syndrome, slight urinary incontinence, degenerative disk disease of the spine, history of bursitis, and she is postmenopausal.  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  She had a left union knee replacement prior to the left knee replacement and a left total hip replacement with subsequent closed reductions.  CLINICAL EXAM:  She is currently emergency department.  She has received 1  dose of vanco and the erythema is improving.  She has less discomfort and pain.  She has no drainage.  The wound is clean, dry, intact.  There is some slight surrounding erythema.  Distal neurological exam is intact, 2+ dorsalis pedis, posterior tibialis pulses.  No significant hip or ankle pain.  No shortness of breath or chest pain . Abdomen is benign and tender.  LAB RESULTS:  White count is 9.1, hematocrit is 28.4, INR is 1.5.  At this point in time, the patient does not show any significant deep wound infection.  She does have some evidence of cellulitis but this is improving with her vanco dose.  She is going to receive a second vancomycin dose and then she can be discharged to home.  I did speak with Dr. Sherlean Foot.  He is aware of the situation and agrees with plan.  The patient already has a followup with him tomorrow, Tuesday at 9:30 a.m. She will keep that appointment.     Alvy Beal, MD    DDB/MEDQ  D:  09/07/2010  T:  09/07/2010  Job:  [id number]  Electronically Signed by Venita Lick MD on 09/15/2010 07:51:58 PM

## 2010-12-14 LAB — TYPE AND SCREEN
ABO/RH(D): AB POS
Antibody Screen: NEGATIVE

## 2010-12-14 LAB — BASIC METABOLIC PANEL
BUN: 20
BUN: 29 — ABNORMAL HIGH
BUN: 29 — ABNORMAL HIGH
CO2: 26
CO2: 26
CO2: 24
Calcium: 7.6 — ABNORMAL LOW
Calcium: 7.9 — ABNORMAL LOW
Calcium: 9
Chloride: 100
Chloride: 105
Chloride: 88 — ABNORMAL LOW
Creatinine, Ser: 1
Creatinine, Ser: 1.08
Creatinine, Ser: 0.91
GFR calc Af Amer: 60 — ABNORMAL LOW
GFR calc Af Amer: >60
GFR calc Af Amer: >60
GFR calc non Af Amer: 49 — ABNORMAL LOW
GFR calc non Af Amer: 54 — ABNORMAL LOW
GFR calc non Af Amer: >60
Glucose, Bld: 124 — ABNORMAL HIGH
Glucose, Bld: 125 — ABNORMAL HIGH
Glucose, Bld: 99
Potassium: 3.7
Potassium: 4.1
Potassium: 4.1
Sodium: 131 — ABNORMAL LOW
Sodium: 136
Sodium: 122 — ABNORMAL LOW

## 2010-12-14 LAB — CBC
HCT: 24.8 — ABNORMAL LOW
HCT: 26.3 — ABNORMAL LOW
HCT: 30.2 — ABNORMAL LOW
HCT: 29.4 — ABNORMAL LOW
HCT: 34.6 — ABNORMAL LOW
Hemoglobin: 8.1 — ABNORMAL LOW
Hemoglobin: 8.5 — ABNORMAL LOW
Hemoglobin: 9.9 — ABNORMAL LOW
Hemoglobin: 11.4 — ABNORMAL LOW
Hemoglobin: 9.7 — ABNORMAL LOW
MCHC: 32.5
MCHC: 32.7
MCHC: 32.9
MCHC: 32.8
MCHC: 33.1
MCV: 88.2
MCV: 89
MCV: 89.2
MCV: 88
MCV: 88.5
Platelets: 145 — ABNORMAL LOW
Platelets: 159
Platelets: 178
Platelets: 228
Platelets: 362
RBC: 2.81 — ABNORMAL LOW
RBC: 2.96 — ABNORMAL LOW
RBC: 3.38 — ABNORMAL LOW
RBC: 3.34 — ABNORMAL LOW
RBC: 3.9
RDW: 13.8
RDW: 14
RDW: 14.2
RDW: 15.9 — ABNORMAL HIGH
RDW: 16.2 — ABNORMAL HIGH
WBC: 6.2
WBC: 6.7
WBC: 7
WBC: 7.4
WBC: 8.2

## 2010-12-14 LAB — PROTIME-INR
INR: 1.1
INR: 1.5
INR: 1.7 — ABNORMAL HIGH
INR: 1
Prothrombin Time: 14.8
Prothrombin Time: 19.2 — ABNORMAL HIGH
Prothrombin Time: 21.1 — ABNORMAL HIGH
Prothrombin Time: 13.8

## 2010-12-14 LAB — POCT I-STAT, CHEM 8
BUN: 50 — ABNORMAL HIGH
Calcium, Ion: 1.16
Chloride: 99
Creatinine, Ser: 1.4 — ABNORMAL HIGH
Glucose, Bld: 98
HCT: 31 — ABNORMAL LOW
Hemoglobin: 10.5 — ABNORMAL LOW
Potassium: 5.1
Sodium: 128 — ABNORMAL LOW
TCO2: 24

## 2010-12-14 LAB — DIFFERENTIAL
Basophils Absolute: 0.1
Basophils Absolute: 0.1
Basophils Relative: 1
Basophils Relative: 1
Eosinophils Absolute: 0.2
Eosinophils Absolute: 0.3
Eosinophils Relative: 3
Eosinophils Relative: 4
Lymphocytes Relative: 13
Lymphocytes Relative: 32
Lymphs Abs: 1.1
Lymphs Abs: 2.4
Monocytes Absolute: 0.7
Monocytes Absolute: 0.7
Monocytes Relative: 10
Monocytes Relative: 8
Neutro Abs: 3.9
Neutro Abs: 6.2
Neutrophils Relative %: 53
Neutrophils Relative %: 76

## 2010-12-14 LAB — ABO/RH: ABO/RH(D): AB POS

## 2010-12-14 LAB — POCT CARDIAC MARKERS
CKMB, poc: <1 — ABNORMAL LOW
CKMB, poc: <1 — ABNORMAL LOW
Myoglobin, poc: 70.4
Myoglobin, poc: 78.2
Troponin i, poc: <0.05
Troponin i, poc: <0.05

## 2010-12-14 LAB — D-DIMER, QUANTITATIVE (NOT AT ARMC): D-Dimer, Quant: 1.57 — ABNORMAL HIGH

## 2010-12-15 LAB — COMPREHENSIVE METABOLIC PANEL
ALT: 28
AST: 46 — ABNORMAL HIGH
Albumin: 3.5
Alkaline Phosphatase: 54
BUN: 15
CO2: 21
Calcium: 8.7
Chloride: 86 — ABNORMAL LOW
Creatinine, Ser: 1.12
GFR calc Af Amer: 57 — ABNORMAL LOW
GFR calc non Af Amer: 47 — ABNORMAL LOW
Glucose, Bld: 177 — ABNORMAL HIGH
Potassium: 3 — ABNORMAL LOW
Sodium: 117 — CL
Total Bilirubin: 0.9
Total Protein: 6.3

## 2010-12-15 LAB — CULTURE, BLOOD (ROUTINE X 2)
Culture: NO GROWTH
Culture: NO GROWTH

## 2010-12-15 LAB — BASIC METABOLIC PANEL
BUN: 14
BUN: 17
BUN: 17
BUN: 22
CO2: 22
CO2: 22
CO2: 23
CO2: 24
Calcium: 8.3 — ABNORMAL LOW
Calcium: 8.4
Calcium: 8.7
Calcium: 9
Chloride: 100
Chloride: 102
Chloride: 103
Chloride: 93 — ABNORMAL LOW
Creatinine, Ser: 0.8
Creatinine, Ser: 0.85
Creatinine, Ser: 0.88
Creatinine, Ser: 0.91
GFR calc Af Amer: >60
GFR calc Af Amer: >60
GFR calc Af Amer: >60
GFR calc Af Amer: >60
GFR calc non Af Amer: >60
GFR calc non Af Amer: >60
GFR calc non Af Amer: >60
GFR calc non Af Amer: >60
Glucose, Bld: 93
Glucose, Bld: 97
Glucose, Bld: 98
Glucose, Bld: 98
Potassium: 3.8
Potassium: 4.2
Potassium: 4.4
Potassium: 4.5
Sodium: 123 — ABNORMAL LOW
Sodium: 130 — ABNORMAL LOW
Sodium: 132 — ABNORMAL LOW
Sodium: 132 — ABNORMAL LOW

## 2010-12-15 LAB — URINALYSIS, ROUTINE W REFLEX MICROSCOPIC
Bilirubin Urine: NEGATIVE
Glucose, UA: NEGATIVE
Hgb urine dipstick: NEGATIVE
Nitrite: POSITIVE — AB
Protein, ur: NEGATIVE
Specific Gravity, Urine: 1.043 — ABNORMAL HIGH
Urobilinogen, UA: 1
pH: 5.5

## 2010-12-15 LAB — CK TOTAL AND CKMB (NOT AT ARMC)
CK, MB: 1.5
CK, MB: 1.5
Relative Index: INVALID
Relative Index: INVALID
Total CK: 46
Total CK: 56

## 2010-12-15 LAB — CBC
HCT: 28 — ABNORMAL LOW
HCT: 29.6 — ABNORMAL LOW
HCT: 30.9 — ABNORMAL LOW
HCT: 32.9 — ABNORMAL LOW
Hemoglobin: 10 — ABNORMAL LOW
Hemoglobin: 10.6 — ABNORMAL LOW
Hemoglobin: 9.2 — ABNORMAL LOW
Hemoglobin: 9.9 — ABNORMAL LOW
MCHC: 32.2
MCHC: 32.4
MCHC: 32.9
MCHC: 33.3
MCV: 87.7
MCV: 88.4
MCV: 88.8
MCV: 89.2
Platelets: 203
Platelets: 203
Platelets: 233
Platelets: 283
RBC: 3.16 — ABNORMAL LOW
RBC: 3.38 — ABNORMAL LOW
RBC: 3.46 — ABNORMAL LOW
RBC: 3.73 — ABNORMAL LOW
RDW: 15.7 — ABNORMAL HIGH
RDW: 15.9 — ABNORMAL HIGH
RDW: 16 — ABNORMAL HIGH
RDW: 16.3 — ABNORMAL HIGH
WBC: 4.7
WBC: 5.2
WBC: 5.8
WBC: 7

## 2010-12-15 LAB — URINE CULTURE
Colony Count: NO GROWTH
Culture: NO GROWTH
Special Requests: NEGATIVE

## 2010-12-15 LAB — TROPONIN I
Troponin I: 0.01
Troponin I: 0.02

## 2010-12-15 LAB — MAGNESIUM: Magnesium: 2.3

## 2010-12-15 LAB — URINE MICROSCOPIC-ADD ON

## 2010-12-15 LAB — TSH: TSH: 1.683

## 2010-12-16 LAB — COMPREHENSIVE METABOLIC PANEL
ALT: 23
AST: 24
Albumin: 3.5
Alkaline Phosphatase: 57
BUN: 41 — ABNORMAL HIGH
CO2: 26
Calcium: 9.2
Chloride: 102
Creatinine, Ser: 1.3 — ABNORMAL HIGH
GFR calc Af Amer: 48 — ABNORMAL LOW
GFR calc non Af Amer: 40 — ABNORMAL LOW
Glucose, Bld: 102 — ABNORMAL HIGH
Potassium: 5
Sodium: 137
Total Bilirubin: 0.9
Total Protein: 6.9

## 2010-12-16 LAB — PROTIME-INR
INR: 1
Prothrombin Time: 13.5

## 2010-12-16 LAB — URINALYSIS, ROUTINE W REFLEX MICROSCOPIC
Bilirubin Urine: NEGATIVE
Glucose, UA: NEGATIVE
Hgb urine dipstick: NEGATIVE
Ketones, ur: NEGATIVE
Nitrite: NEGATIVE
Protein, ur: NEGATIVE
Specific Gravity, Urine: 1.024
Urobilinogen, UA: 0.2
pH: 5.5

## 2010-12-16 LAB — CBC
HCT: 37.4
Hemoglobin: 12.2
MCHC: 32.7
MCV: 90.1
Platelets: 268
RBC: 4.15
RDW: 13.7
WBC: 5.6

## 2010-12-16 LAB — APTT: aPTT: 23 — ABNORMAL LOW

## 2011-03-17 DIAGNOSIS — M999 Biomechanical lesion, unspecified: Secondary | ICD-10-CM | POA: Diagnosis not present

## 2011-03-17 DIAGNOSIS — M9981 Other biomechanical lesions of cervical region: Secondary | ICD-10-CM | POA: Diagnosis not present

## 2011-03-20 ENCOUNTER — Emergency Department (HOSPITAL_COMMUNITY)
Admission: EM | Admit: 2011-03-20 | Discharge: 2011-03-20 | Disposition: A | Discharge status: Home / Self Care | Payer: Medicare Other | Attending: Emergency Medicine | Admitting: Emergency Medicine

## 2011-03-20 ENCOUNTER — Other Ambulatory Visit: Payer: Self-pay

## 2011-03-20 ENCOUNTER — Encounter (HOSPITAL_COMMUNITY): Payer: Self-pay | Admitting: *Deleted

## 2011-03-20 ENCOUNTER — Emergency Department (HOSPITAL_COMMUNITY): Payer: Medicare Other

## 2011-03-20 DIAGNOSIS — R079 Chest pain, unspecified: Secondary | ICD-10-CM | POA: Diagnosis not present

## 2011-03-20 DIAGNOSIS — R071 Chest pain on breathing: Secondary | ICD-10-CM | POA: Diagnosis not present

## 2011-03-20 DIAGNOSIS — R609 Edema, unspecified: Secondary | ICD-10-CM | POA: Insufficient documentation

## 2011-03-20 DIAGNOSIS — R0781 Pleurodynia: Secondary | ICD-10-CM

## 2011-03-20 DIAGNOSIS — I319 Disease of pericardium, unspecified: Secondary | ICD-10-CM | POA: Diagnosis not present

## 2011-03-20 DIAGNOSIS — Z79899 Other long term (current) drug therapy: Secondary | ICD-10-CM | POA: Insufficient documentation

## 2011-03-20 DIAGNOSIS — R9389 Abnormal findings on diagnostic imaging of other specified body structures: Secondary | ICD-10-CM | POA: Diagnosis not present

## 2011-03-20 HISTORY — DX: Disease of pericardium, unspecified: I31.9

## 2011-03-20 LAB — D-DIMER, QUANTITATIVE: D-Dimer, Quant: 1.14 ug{FEU}/mL — ABNORMAL HIGH (ref 0.00–0.48)

## 2011-03-20 LAB — BASIC METABOLIC PANEL WITH GFR
BUN: 32 mg/dL — ABNORMAL HIGH (ref 6–23)
CO2: 24 meq/L (ref 19–32)
Calcium: 8.9 mg/dL (ref 8.4–10.5)
Chloride: 100 meq/L (ref 96–112)
Creatinine, Ser: 1.19 mg/dL — ABNORMAL HIGH (ref 0.50–1.10)
GFR calc Af Amer: 49 mL/min — ABNORMAL LOW
GFR calc non Af Amer: 42 mL/min — ABNORMAL LOW
Glucose, Bld: 107 mg/dL — ABNORMAL HIGH (ref 70–99)
Potassium: 4.4 meq/L (ref 3.5–5.1)
Sodium: 135 meq/L (ref 135–145)

## 2011-03-20 LAB — CBC
HCT: 30 % — ABNORMAL LOW (ref 36.0–46.0)
Hemoglobin: 10 g/dL — ABNORMAL LOW (ref 12.0–15.0)
MCH: 29.5 pg (ref 26.0–34.0)
MCHC: 33.3 g/dL (ref 30.0–36.0)
MCV: 88.5 fL (ref 78.0–100.0)
Platelets: 201 K/uL (ref 150–400)
RBC: 3.39 MIL/uL — ABNORMAL LOW (ref 3.87–5.11)
RDW: 14.1 % (ref 11.5–15.5)
WBC: 9.5 K/uL (ref 4.0–10.5)

## 2011-03-20 LAB — POCT I-STAT TROPONIN I: Troponin i, poc: 0 ng/mL (ref 0.00–0.08)

## 2011-03-20 MED ORDER — PREDNISONE (PAK) 10 MG PO TABS: ORAL_TABLET | ORAL | Status: DC

## 2011-03-20 MED ORDER — PREDNISONE 20 MG PO TABS
60.0000 mg | ORAL_TABLET | Freq: Once | ORAL | Status: AC
  Administered 2011-03-20: 60 mg via ORAL
  Filled 2011-03-20: qty 3

## 2011-03-20 NOTE — ED Provider Notes (Signed)
Medical screening examination/treatment/procedure(s) were conducted as a shared visit with non-physician practitioner(s) and myself.  I personally evaluated the patient during the encounter.  No dyspnea, no tachypnea, normal oxygenation.  Clinical presentation does not support a pulmonary embolus. Patient has had multiple bouts of pericarditis in the past.  She feels this is a similar presentation.  Will Rx prednisone.  Follow up with cardiologist on Monday  Donnetta Hutching, MD 03/20/11 2304

## 2011-03-20 NOTE — ED Notes (Signed)
Pt reports CP that started yesterday.  Reports having a hx of pericarditis-couple of years ago, states that it feels the same.  Reports increased pain with deep inspiration and movement.  Denies SOB, N/V.

## 2011-03-20 NOTE — ED Provider Notes (Signed)
History     CSN: [id number]  Arrival date & time 03/20/11  1308   First MD Initiated Contact with Patient 03/20/11 2005      Chief Complaint  Patient presents with  . Chest Pain    (Consider location/radiation/quality/duration/timing/severity/associated sxs/prior treatment) Patient is a 78 y.o. female presenting with chest pain. The history is provided by the patient.  Chest Pain The chest pain began yesterday. Chest pain occurs constantly. The pain is associated with breathing. At its most intense, the pain is at 9/10. The pain is currently at 5/10. The severity of the pain is moderate. The quality of the pain is described as pleuritic and sharp. The pain does not radiate. Chest pain is worsened by deep breathing. Pertinent negatives for primary symptoms include no fever, no syncope, no shortness of breath, no cough, no wheezing, no palpitations, no nausea and no vomiting.  Pertinent negatives for associated symptoms include no diaphoresis, no lower extremity edema, no near-syncope, no numbness, no orthopnea and no weakness.   Pt states she thinks she has pericarditis. States she has had it in the past and it feels the same. Denies URI symptoms, denies cough, denies SOB, denies fever, chills, nausea, vomiting, abdominal pain.  Past Medical History  Diagnosis Date  . Pericarditis     Past Surgical History  Procedure Date  . Joint replacement     History reviewed. No pertinent family history.  History  Substance Use Topics  . Smoking status: Never Smoker   . Smokeless tobacco: Not on file  . Alcohol Use: No    OB History    Grav Para Term Preterm Abortions TAB SAB Ect Mult Living                  Review of Systems  Constitutional: Negative for fever and diaphoresis.  HENT: Negative.   Eyes: Negative.   Respiratory: Negative for cough, shortness of breath and wheezing.   Cardiovascular: Positive for chest pain. Negative for palpitations, orthopnea, leg swelling,  syncope and near-syncope.  Gastrointestinal: Negative.  Negative for nausea and vomiting.  Genitourinary: Negative.   Musculoskeletal: Negative.   Skin: Negative.   Neurological: Negative.  Negative for weakness and numbness.  Psychiatric/Behavioral: Negative.     Allergies  Keflex; Shellfish allergy; and Sulfa antibiotics  Home Medications   Current Outpatient Rx  Name Route Sig Dispense Refill  . CARVEDILOL 12.5 MG PO TABS Oral Take 12.5 mg by mouth 2 (two) times daily. Takes 1/2 tablet in am and 1 tablet at night     . DILTIAZEM HCL ER COATED BEADS 240 MG PO CP24 Oral Take 240 mg by mouth daily.      . OMEGA-3 FATTY ACIDS 1000 MG PO CAPS Oral Take 2 g by mouth 2 (two) times daily.      Marland Kitchen GLUCOSAMINE CHONDROITIN PLUS PO Oral Take 2 tablets by mouth daily.      Marland Kitchen HYDROXYZINE HCL 10 MG PO TABS Oral Take 5 mg by mouth at bedtime.      Marland Kitchen PRESERVISION AREDS 2 PO Oral Take 1 capsule by mouth 2 (two) times daily.      Marland Kitchen OVER THE COUNTER MEDICATION Oral Take 1 tablet by mouth daily. Vitamin C     . OVER THE COUNTER MEDICATION Oral Take 1 tablet by mouth daily. Vitamin E     . OVER THE COUNTER MEDICATION Oral Take 1 tablet by mouth daily. Vitamin B-complex     . VALSARTAN 80 MG PO  TABS Oral Take 80 mg by mouth daily.        BP 127/63  Pulse 80  Temp(Src) 98.1 F (36.7 C) (Oral)  Resp 18  SpO2 96%  Physical Exam  Nursing note and vitals reviewed. Constitutional: She is oriented to person, place, and time. She appears well-developed and well-nourished. No distress.  HENT:  Head: Normocephalic.  Eyes: Conjunctivae are normal.  Neck: Neck supple.  Cardiovascular: Normal rate, regular rhythm and normal heart sounds.  Exam reveals no friction rub.   No murmur heard. Pulmonary/Chest: Effort normal and breath sounds normal. No respiratory distress. She has no wheezes. She has no rales.  Abdominal: Soft. Bowel sounds are normal. She exhibits no distension.  Musculoskeletal: Normal range  of motion.       Non pitting edema of LE bilat.   Neurological: She is alert and oriented to person, place, and time.  Skin: Skin is warm. No rash noted.  Psychiatric: She has a normal mood and affect.    ED Course  Procedures (including critical care time)   Date: 03/20/2011  Rate: 78  Rhythm: normal sinus rhythm  QRS Axis: normal  Intervals: normal  ST/T Wave abnormalities: normal  Conduction Disutrbances:none  Narrative Interpretation:   Old EKG Reviewed: No significant changes noted      Labs Reviewed  CBC  BASIC METABOLIC PANEL  POCT CARDIAC MARKERS  D-DIMER, QUANTITATIVE   Dg Chest 2 View  03/20/2011  *RADIOLOGY REPORT*  Clinical Data: Left upper chest pain.  History of pericarditis.  CHEST - 2 VIEW  Comparison: Two-view chest 09/06/2010.  Findings: The heart size is upper limits of normal.  Mild interstitial coarsening is chronic.  No focal airspace disease is evident.  There is no evidence for pleural or pericardial effusion. The visualized soft tissues and bony thorax are unremarkable.  IMPRESSION:  1.  No acute cardiopulmonary disease or significant interval change. 2.  Borderline cardiomegaly without failure. 3.  Interstitial coarsening is likely chronic.  Original Report Authenticated By: Jamesetta Orleans. MATTERN, M.D.     No diagnosis found.  Pt's d dimer is elevated, otherwise no findings to explain pt's symptoms. On further evaluation, pt is not tachycardic, hypoxic, tachypnic. Pt state this pain is same as last time when she had pericarditis. Pt refusing CT angio bc does not want IV dye due to hx of renal insufficiency in the past. No VQ scan available at this time. Pt is wanting to go home. Explained of risks due to inability to rule out PE. Pt understands and will return if symptoms are worsening.    MDM          Lottie Mussel, PA 03/20/11 2237

## 2011-03-22 DIAGNOSIS — I10 Essential (primary) hypertension: Secondary | ICD-10-CM | POA: Diagnosis not present

## 2011-03-22 DIAGNOSIS — R071 Chest pain on breathing: Secondary | ICD-10-CM | POA: Diagnosis not present

## 2011-03-22 DIAGNOSIS — I359 Nonrheumatic aortic valve disorder, unspecified: Secondary | ICD-10-CM | POA: Diagnosis not present

## 2011-04-05 DIAGNOSIS — M999 Biomechanical lesion, unspecified: Secondary | ICD-10-CM | POA: Diagnosis not present

## 2011-04-05 DIAGNOSIS — M9981 Other biomechanical lesions of cervical region: Secondary | ICD-10-CM | POA: Diagnosis not present

## 2011-04-12 DIAGNOSIS — I10 Essential (primary) hypertension: Secondary | ICD-10-CM | POA: Diagnosis not present

## 2011-04-12 DIAGNOSIS — R079 Chest pain, unspecified: Secondary | ICD-10-CM | POA: Diagnosis not present

## 2011-04-19 DIAGNOSIS — M999 Biomechanical lesion, unspecified: Secondary | ICD-10-CM | POA: Diagnosis not present

## 2011-04-19 DIAGNOSIS — M9981 Other biomechanical lesions of cervical region: Secondary | ICD-10-CM | POA: Diagnosis not present

## 2011-04-21 DIAGNOSIS — M5137 Other intervertebral disc degeneration, lumbosacral region: Secondary | ICD-10-CM | POA: Diagnosis not present

## 2011-04-21 DIAGNOSIS — M999 Biomechanical lesion, unspecified: Secondary | ICD-10-CM | POA: Diagnosis not present

## 2011-04-22 DIAGNOSIS — H353190 Nonexudative age-related macular degeneration, unspecified eye, stage unspecified: Secondary | ICD-10-CM | POA: Diagnosis not present

## 2011-04-22 DIAGNOSIS — Z961 Presence of intraocular lens: Secondary | ICD-10-CM | POA: Diagnosis not present

## 2011-04-22 DIAGNOSIS — H2640 Unspecified secondary cataract: Secondary | ICD-10-CM | POA: Diagnosis not present

## 2011-04-22 DIAGNOSIS — H26491 Other secondary cataract, right eye: Secondary | ICD-10-CM | POA: Insufficient documentation

## 2011-04-26 DIAGNOSIS — M5137 Other intervertebral disc degeneration, lumbosacral region: Secondary | ICD-10-CM | POA: Diagnosis not present

## 2011-04-26 DIAGNOSIS — M999 Biomechanical lesion, unspecified: Secondary | ICD-10-CM | POA: Diagnosis not present

## 2011-04-29 DIAGNOSIS — M999 Biomechanical lesion, unspecified: Secondary | ICD-10-CM | POA: Diagnosis not present

## 2011-04-29 DIAGNOSIS — M5137 Other intervertebral disc degeneration, lumbosacral region: Secondary | ICD-10-CM | POA: Diagnosis not present

## 2011-05-03 DIAGNOSIS — M7660 Achilles tendinitis, unspecified leg: Secondary | ICD-10-CM | POA: Diagnosis not present

## 2011-05-04 DIAGNOSIS — M999 Biomechanical lesion, unspecified: Secondary | ICD-10-CM | POA: Diagnosis not present

## 2011-05-04 DIAGNOSIS — M5137 Other intervertebral disc degeneration, lumbosacral region: Secondary | ICD-10-CM | POA: Diagnosis not present

## 2011-05-07 DIAGNOSIS — M7660 Achilles tendinitis, unspecified leg: Secondary | ICD-10-CM | POA: Diagnosis not present

## 2011-05-10 DIAGNOSIS — M999 Biomechanical lesion, unspecified: Secondary | ICD-10-CM | POA: Diagnosis not present

## 2011-05-10 DIAGNOSIS — M5137 Other intervertebral disc degeneration, lumbosacral region: Secondary | ICD-10-CM | POA: Diagnosis not present

## 2011-05-10 DIAGNOSIS — M7660 Achilles tendinitis, unspecified leg: Secondary | ICD-10-CM | POA: Diagnosis not present

## 2011-05-14 DIAGNOSIS — M7660 Achilles tendinitis, unspecified leg: Secondary | ICD-10-CM | POA: Diagnosis not present

## 2011-05-18 DIAGNOSIS — M7660 Achilles tendinitis, unspecified leg: Secondary | ICD-10-CM | POA: Diagnosis not present

## 2011-05-21 DIAGNOSIS — M7660 Achilles tendinitis, unspecified leg: Secondary | ICD-10-CM | POA: Diagnosis not present

## 2011-05-25 DIAGNOSIS — M999 Biomechanical lesion, unspecified: Secondary | ICD-10-CM | POA: Diagnosis not present

## 2011-05-25 DIAGNOSIS — M5137 Other intervertebral disc degeneration, lumbosacral region: Secondary | ICD-10-CM | POA: Diagnosis not present

## 2011-05-27 DIAGNOSIS — M25579 Pain in unspecified ankle and joints of unspecified foot: Secondary | ICD-10-CM | POA: Diagnosis not present

## 2011-06-17 DIAGNOSIS — M9981 Other biomechanical lesions of cervical region: Secondary | ICD-10-CM | POA: Diagnosis not present

## 2011-06-17 DIAGNOSIS — M999 Biomechanical lesion, unspecified: Secondary | ICD-10-CM | POA: Diagnosis not present

## 2011-06-18 DIAGNOSIS — M25579 Pain in unspecified ankle and joints of unspecified foot: Secondary | ICD-10-CM | POA: Diagnosis not present

## 2011-06-21 ENCOUNTER — Other Ambulatory Visit: Payer: Self-pay | Admitting: Internal Medicine

## 2011-06-21 DIAGNOSIS — Z1231 Encounter for screening mammogram for malignant neoplasm of breast: Secondary | ICD-10-CM

## 2011-06-24 DIAGNOSIS — M7660 Achilles tendinitis, unspecified leg: Secondary | ICD-10-CM | POA: Diagnosis not present

## 2011-06-28 DIAGNOSIS — M7660 Achilles tendinitis, unspecified leg: Secondary | ICD-10-CM | POA: Diagnosis not present

## 2011-06-30 DIAGNOSIS — M7660 Achilles tendinitis, unspecified leg: Secondary | ICD-10-CM | POA: Diagnosis not present

## 2011-07-01 DIAGNOSIS — Z0000 Encounter for general adult medical examination without abnormal findings: Secondary | ICD-10-CM | POA: Diagnosis not present

## 2011-07-01 DIAGNOSIS — I10 Essential (primary) hypertension: Secondary | ICD-10-CM | POA: Diagnosis not present

## 2011-07-01 DIAGNOSIS — E782 Mixed hyperlipidemia: Secondary | ICD-10-CM | POA: Diagnosis not present

## 2011-07-01 DIAGNOSIS — N182 Chronic kidney disease, stage 2 (mild): Secondary | ICD-10-CM | POA: Diagnosis not present

## 2011-07-02 DIAGNOSIS — M7660 Achilles tendinitis, unspecified leg: Secondary | ICD-10-CM | POA: Diagnosis not present

## 2011-07-05 DIAGNOSIS — M7660 Achilles tendinitis, unspecified leg: Secondary | ICD-10-CM | POA: Diagnosis not present

## 2011-07-07 DIAGNOSIS — M7660 Achilles tendinitis, unspecified leg: Secondary | ICD-10-CM | POA: Diagnosis not present

## 2011-07-12 DIAGNOSIS — M7660 Achilles tendinitis, unspecified leg: Secondary | ICD-10-CM | POA: Diagnosis not present

## 2011-07-14 DIAGNOSIS — M7660 Achilles tendinitis, unspecified leg: Secondary | ICD-10-CM | POA: Diagnosis not present

## 2011-07-14 HISTORY — PX: OTHER SURGICAL HISTORY: SHX169

## 2011-07-15 DIAGNOSIS — M9981 Other biomechanical lesions of cervical region: Secondary | ICD-10-CM | POA: Diagnosis not present

## 2011-07-15 DIAGNOSIS — M999 Biomechanical lesion, unspecified: Secondary | ICD-10-CM | POA: Diagnosis not present

## 2011-07-16 DIAGNOSIS — M7660 Achilles tendinitis, unspecified leg: Secondary | ICD-10-CM | POA: Diagnosis not present

## 2011-07-20 DIAGNOSIS — M25579 Pain in unspecified ankle and joints of unspecified foot: Secondary | ICD-10-CM | POA: Diagnosis not present

## 2011-07-21 ENCOUNTER — Ambulatory Visit
Admission: RE | Admit: 2011-07-21 | Discharge: 2011-07-21 | Disposition: A | Discharge status: Home / Self Care | Payer: Medicare Other | Source: Ambulatory Visit | Attending: Orthopedic Surgery | Admitting: Orthopedic Surgery

## 2011-07-21 ENCOUNTER — Other Ambulatory Visit: Payer: Self-pay | Admitting: Orthopedic Surgery

## 2011-07-21 DIAGNOSIS — M7660 Achilles tendinitis, unspecified leg: Secondary | ICD-10-CM | POA: Diagnosis not present

## 2011-07-21 DIAGNOSIS — M25579 Pain in unspecified ankle and joints of unspecified foot: Secondary | ICD-10-CM | POA: Diagnosis not present

## 2011-07-21 DIAGNOSIS — M25571 Pain in right ankle and joints of right foot: Secondary | ICD-10-CM

## 2011-07-21 DIAGNOSIS — M25473 Effusion, unspecified ankle: Secondary | ICD-10-CM | POA: Diagnosis not present

## 2011-07-22 ENCOUNTER — Ambulatory Visit
Admission: RE | Admit: 2011-07-22 | Discharge: 2011-07-22 | Disposition: A | Discharge status: Home / Self Care | Payer: Medicare Other | Source: Ambulatory Visit | Attending: Internal Medicine | Admitting: Internal Medicine

## 2011-07-22 DIAGNOSIS — Z1231 Encounter for screening mammogram for malignant neoplasm of breast: Secondary | ICD-10-CM

## 2011-07-26 DIAGNOSIS — S93499A Sprain of other ligament of unspecified ankle, initial encounter: Secondary | ICD-10-CM | POA: Diagnosis not present

## 2011-07-27 ENCOUNTER — Other Ambulatory Visit: Payer: Self-pay | Admitting: Internal Medicine

## 2011-07-27 ENCOUNTER — Ambulatory Visit
Admission: RE | Admit: 2011-07-27 | Discharge: 2011-07-27 | Disposition: A | Discharge status: Home / Self Care | Payer: Medicare Other | Source: Ambulatory Visit | Attending: Internal Medicine | Admitting: Internal Medicine

## 2011-07-27 DIAGNOSIS — R928 Other abnormal and inconclusive findings on diagnostic imaging of breast: Secondary | ICD-10-CM

## 2011-07-27 DIAGNOSIS — N6489 Other specified disorders of breast: Secondary | ICD-10-CM | POA: Diagnosis not present

## 2011-07-28 DIAGNOSIS — M66369 Spontaneous rupture of flexor tendons, unspecified lower leg: Secondary | ICD-10-CM | POA: Diagnosis not present

## 2011-07-28 DIAGNOSIS — Q663 Other congenital varus deformities of feet, unspecified foot: Secondary | ICD-10-CM | POA: Diagnosis not present

## 2011-07-28 DIAGNOSIS — S93499A Sprain of other ligament of unspecified ankle, initial encounter: Secondary | ICD-10-CM | POA: Diagnosis not present

## 2011-07-28 DIAGNOSIS — M928 Other specified juvenile osteochondrosis: Secondary | ICD-10-CM | POA: Diagnosis not present

## 2011-07-28 DIAGNOSIS — M6240 Contracture of muscle, unspecified site: Secondary | ICD-10-CM | POA: Diagnosis not present

## 2011-07-28 DIAGNOSIS — S96819A Strain of other specified muscles and tendons at ankle and foot level, unspecified foot, initial encounter: Secondary | ICD-10-CM | POA: Diagnosis not present

## 2011-07-31 DIAGNOSIS — M7660 Achilles tendinitis, unspecified leg: Secondary | ICD-10-CM | POA: Diagnosis not present

## 2011-07-31 DIAGNOSIS — I10 Essential (primary) hypertension: Secondary | ICD-10-CM | POA: Diagnosis not present

## 2011-07-31 DIAGNOSIS — IMO0001 Reserved for inherently not codable concepts without codable children: Secondary | ICD-10-CM | POA: Diagnosis not present

## 2011-07-31 DIAGNOSIS — R269 Unspecified abnormalities of gait and mobility: Secondary | ICD-10-CM | POA: Diagnosis not present

## 2011-07-31 DIAGNOSIS — Z4789 Encounter for other orthopedic aftercare: Secondary | ICD-10-CM | POA: Diagnosis not present

## 2011-07-31 DIAGNOSIS — M1990 Unspecified osteoarthritis, unspecified site: Secondary | ICD-10-CM | POA: Diagnosis not present

## 2011-08-02 DIAGNOSIS — R269 Unspecified abnormalities of gait and mobility: Secondary | ICD-10-CM | POA: Diagnosis not present

## 2011-08-02 DIAGNOSIS — IMO0001 Reserved for inherently not codable concepts without codable children: Secondary | ICD-10-CM | POA: Diagnosis not present

## 2011-08-02 DIAGNOSIS — Z4789 Encounter for other orthopedic aftercare: Secondary | ICD-10-CM | POA: Diagnosis not present

## 2011-08-02 DIAGNOSIS — M1990 Unspecified osteoarthritis, unspecified site: Secondary | ICD-10-CM | POA: Diagnosis not present

## 2011-08-02 DIAGNOSIS — I10 Essential (primary) hypertension: Secondary | ICD-10-CM | POA: Diagnosis not present

## 2011-08-03 DIAGNOSIS — I10 Essential (primary) hypertension: Secondary | ICD-10-CM | POA: Diagnosis not present

## 2011-08-03 DIAGNOSIS — Z4789 Encounter for other orthopedic aftercare: Secondary | ICD-10-CM | POA: Diagnosis not present

## 2011-08-03 DIAGNOSIS — IMO0001 Reserved for inherently not codable concepts without codable children: Secondary | ICD-10-CM | POA: Diagnosis not present

## 2011-08-03 DIAGNOSIS — M1990 Unspecified osteoarthritis, unspecified site: Secondary | ICD-10-CM | POA: Diagnosis not present

## 2011-08-03 DIAGNOSIS — R269 Unspecified abnormalities of gait and mobility: Secondary | ICD-10-CM | POA: Diagnosis not present

## 2011-08-06 DIAGNOSIS — M1990 Unspecified osteoarthritis, unspecified site: Secondary | ICD-10-CM | POA: Diagnosis not present

## 2011-08-06 DIAGNOSIS — Z4789 Encounter for other orthopedic aftercare: Secondary | ICD-10-CM | POA: Diagnosis not present

## 2011-08-06 DIAGNOSIS — I10 Essential (primary) hypertension: Secondary | ICD-10-CM | POA: Diagnosis not present

## 2011-08-06 DIAGNOSIS — R269 Unspecified abnormalities of gait and mobility: Secondary | ICD-10-CM | POA: Diagnosis not present

## 2011-08-06 DIAGNOSIS — IMO0001 Reserved for inherently not codable concepts without codable children: Secondary | ICD-10-CM | POA: Diagnosis not present

## 2011-08-09 DIAGNOSIS — R269 Unspecified abnormalities of gait and mobility: Secondary | ICD-10-CM | POA: Diagnosis not present

## 2011-08-09 DIAGNOSIS — M1990 Unspecified osteoarthritis, unspecified site: Secondary | ICD-10-CM | POA: Diagnosis not present

## 2011-08-09 DIAGNOSIS — Z4789 Encounter for other orthopedic aftercare: Secondary | ICD-10-CM | POA: Diagnosis not present

## 2011-08-09 DIAGNOSIS — IMO0001 Reserved for inherently not codable concepts without codable children: Secondary | ICD-10-CM | POA: Diagnosis not present

## 2011-08-09 DIAGNOSIS — I10 Essential (primary) hypertension: Secondary | ICD-10-CM | POA: Diagnosis not present

## 2011-08-10 DIAGNOSIS — M7660 Achilles tendinitis, unspecified leg: Secondary | ICD-10-CM | POA: Diagnosis not present

## 2011-08-13 DIAGNOSIS — Z4789 Encounter for other orthopedic aftercare: Secondary | ICD-10-CM | POA: Diagnosis not present

## 2011-08-13 DIAGNOSIS — IMO0001 Reserved for inherently not codable concepts without codable children: Secondary | ICD-10-CM | POA: Diagnosis not present

## 2011-08-13 DIAGNOSIS — M1990 Unspecified osteoarthritis, unspecified site: Secondary | ICD-10-CM | POA: Diagnosis not present

## 2011-08-13 DIAGNOSIS — I10 Essential (primary) hypertension: Secondary | ICD-10-CM | POA: Diagnosis not present

## 2011-08-13 DIAGNOSIS — R269 Unspecified abnormalities of gait and mobility: Secondary | ICD-10-CM | POA: Diagnosis not present

## 2011-08-16 ENCOUNTER — Telehealth: Payer: Self-pay | Admitting: *Deleted

## 2011-08-16 MED ORDER — ESTROGENS CONJUGATED 0.3 MG PO TABS: 0.3000 mg | ORAL_TABLET | Freq: Every day | ORAL | Status: DC

## 2011-08-16 NOTE — Telephone Encounter (Signed)
Patient informed. 

## 2011-08-16 NOTE — Telephone Encounter (Signed)
Pt is calling requesting refill on premarin 0.3 mg daily, she is overdue for her annual (due in may ) pt has ruptured tendon and has cast on her foot for about 5 weeks. Pt said she cant make OV now. She is requesting 90 day supply. Please advise

## 2011-08-16 NOTE — Telephone Encounter (Signed)
Premarin 0.3 mg #90 one by mouth daily no refill. Will need follow up exam within this time frame.

## 2011-08-17 DIAGNOSIS — I10 Essential (primary) hypertension: Secondary | ICD-10-CM | POA: Diagnosis not present

## 2011-08-17 DIAGNOSIS — Z4789 Encounter for other orthopedic aftercare: Secondary | ICD-10-CM | POA: Diagnosis not present

## 2011-08-17 DIAGNOSIS — IMO0001 Reserved for inherently not codable concepts without codable children: Secondary | ICD-10-CM | POA: Diagnosis not present

## 2011-08-17 DIAGNOSIS — R269 Unspecified abnormalities of gait and mobility: Secondary | ICD-10-CM | POA: Diagnosis not present

## 2011-08-17 DIAGNOSIS — M1990 Unspecified osteoarthritis, unspecified site: Secondary | ICD-10-CM | POA: Diagnosis not present

## 2011-09-21 ENCOUNTER — Other Ambulatory Visit: Payer: Self-pay | Admitting: Internal Medicine

## 2011-09-21 DIAGNOSIS — N63 Unspecified lump in unspecified breast: Secondary | ICD-10-CM

## 2011-09-21 DIAGNOSIS — M5137 Other intervertebral disc degeneration, lumbosacral region: Secondary | ICD-10-CM | POA: Diagnosis not present

## 2011-09-21 DIAGNOSIS — M999 Biomechanical lesion, unspecified: Secondary | ICD-10-CM | POA: Diagnosis not present

## 2011-09-21 DIAGNOSIS — M9981 Other biomechanical lesions of cervical region: Secondary | ICD-10-CM | POA: Diagnosis not present

## 2011-09-21 DIAGNOSIS — M5030 Other cervical disc degeneration, unspecified cervical region: Secondary | ICD-10-CM | POA: Diagnosis not present

## 2011-09-22 DIAGNOSIS — M9981 Other biomechanical lesions of cervical region: Secondary | ICD-10-CM | POA: Diagnosis not present

## 2011-09-22 DIAGNOSIS — M999 Biomechanical lesion, unspecified: Secondary | ICD-10-CM | POA: Diagnosis not present

## 2011-09-22 DIAGNOSIS — M5137 Other intervertebral disc degeneration, lumbosacral region: Secondary | ICD-10-CM | POA: Diagnosis not present

## 2011-09-22 DIAGNOSIS — M5030 Other cervical disc degeneration, unspecified cervical region: Secondary | ICD-10-CM | POA: Diagnosis not present

## 2011-09-27 DIAGNOSIS — M5137 Other intervertebral disc degeneration, lumbosacral region: Secondary | ICD-10-CM | POA: Diagnosis not present

## 2011-09-27 DIAGNOSIS — M5030 Other cervical disc degeneration, unspecified cervical region: Secondary | ICD-10-CM | POA: Diagnosis not present

## 2011-09-27 DIAGNOSIS — M9981 Other biomechanical lesions of cervical region: Secondary | ICD-10-CM | POA: Diagnosis not present

## 2011-09-27 DIAGNOSIS — M7660 Achilles tendinitis, unspecified leg: Secondary | ICD-10-CM | POA: Diagnosis not present

## 2011-09-27 DIAGNOSIS — M999 Biomechanical lesion, unspecified: Secondary | ICD-10-CM | POA: Diagnosis not present

## 2011-09-29 DIAGNOSIS — M5137 Other intervertebral disc degeneration, lumbosacral region: Secondary | ICD-10-CM | POA: Diagnosis not present

## 2011-09-29 DIAGNOSIS — M999 Biomechanical lesion, unspecified: Secondary | ICD-10-CM | POA: Diagnosis not present

## 2011-09-29 DIAGNOSIS — M9981 Other biomechanical lesions of cervical region: Secondary | ICD-10-CM | POA: Diagnosis not present

## 2011-09-29 DIAGNOSIS — M5030 Other cervical disc degeneration, unspecified cervical region: Secondary | ICD-10-CM | POA: Diagnosis not present

## 2011-09-30 DIAGNOSIS — M7660 Achilles tendinitis, unspecified leg: Secondary | ICD-10-CM | POA: Diagnosis not present

## 2011-10-04 DIAGNOSIS — S93499A Sprain of other ligament of unspecified ankle, initial encounter: Secondary | ICD-10-CM | POA: Diagnosis not present

## 2011-10-04 DIAGNOSIS — S96819A Strain of other specified muscles and tendons at ankle and foot level, unspecified foot, initial encounter: Secondary | ICD-10-CM | POA: Diagnosis not present

## 2011-10-05 DIAGNOSIS — M9981 Other biomechanical lesions of cervical region: Secondary | ICD-10-CM | POA: Diagnosis not present

## 2011-10-05 DIAGNOSIS — M5137 Other intervertebral disc degeneration, lumbosacral region: Secondary | ICD-10-CM | POA: Diagnosis not present

## 2011-10-05 DIAGNOSIS — M5030 Other cervical disc degeneration, unspecified cervical region: Secondary | ICD-10-CM | POA: Diagnosis not present

## 2011-10-05 DIAGNOSIS — M999 Biomechanical lesion, unspecified: Secondary | ICD-10-CM | POA: Diagnosis not present

## 2011-10-07 DIAGNOSIS — M1710 Unilateral primary osteoarthritis, unspecified knee: Secondary | ICD-10-CM | POA: Diagnosis not present

## 2011-10-07 DIAGNOSIS — M9981 Other biomechanical lesions of cervical region: Secondary | ICD-10-CM | POA: Diagnosis not present

## 2011-10-07 DIAGNOSIS — IMO0002 Reserved for concepts with insufficient information to code with codable children: Secondary | ICD-10-CM | POA: Diagnosis not present

## 2011-10-07 DIAGNOSIS — S93499A Sprain of other ligament of unspecified ankle, initial encounter: Secondary | ICD-10-CM | POA: Diagnosis not present

## 2011-10-07 DIAGNOSIS — M5137 Other intervertebral disc degeneration, lumbosacral region: Secondary | ICD-10-CM | POA: Diagnosis not present

## 2011-10-07 DIAGNOSIS — S96819A Strain of other specified muscles and tendons at ankle and foot level, unspecified foot, initial encounter: Secondary | ICD-10-CM | POA: Diagnosis not present

## 2011-10-07 DIAGNOSIS — M5030 Other cervical disc degeneration, unspecified cervical region: Secondary | ICD-10-CM | POA: Diagnosis not present

## 2011-10-07 DIAGNOSIS — M999 Biomechanical lesion, unspecified: Secondary | ICD-10-CM | POA: Diagnosis not present

## 2011-10-11 DIAGNOSIS — M5137 Other intervertebral disc degeneration, lumbosacral region: Secondary | ICD-10-CM | POA: Diagnosis not present

## 2011-10-11 DIAGNOSIS — M9981 Other biomechanical lesions of cervical region: Secondary | ICD-10-CM | POA: Diagnosis not present

## 2011-10-11 DIAGNOSIS — S93499A Sprain of other ligament of unspecified ankle, initial encounter: Secondary | ICD-10-CM | POA: Diagnosis not present

## 2011-10-11 DIAGNOSIS — M999 Biomechanical lesion, unspecified: Secondary | ICD-10-CM | POA: Diagnosis not present

## 2011-10-11 DIAGNOSIS — M5030 Other cervical disc degeneration, unspecified cervical region: Secondary | ICD-10-CM | POA: Diagnosis not present

## 2011-10-13 DIAGNOSIS — M5137 Other intervertebral disc degeneration, lumbosacral region: Secondary | ICD-10-CM | POA: Diagnosis not present

## 2011-10-13 DIAGNOSIS — M999 Biomechanical lesion, unspecified: Secondary | ICD-10-CM | POA: Diagnosis not present

## 2011-10-13 DIAGNOSIS — M9981 Other biomechanical lesions of cervical region: Secondary | ICD-10-CM | POA: Diagnosis not present

## 2011-10-13 DIAGNOSIS — M5030 Other cervical disc degeneration, unspecified cervical region: Secondary | ICD-10-CM | POA: Diagnosis not present

## 2011-10-14 DIAGNOSIS — S93499A Sprain of other ligament of unspecified ankle, initial encounter: Secondary | ICD-10-CM | POA: Diagnosis not present

## 2011-10-14 DIAGNOSIS — S96819A Strain of other specified muscles and tendons at ankle and foot level, unspecified foot, initial encounter: Secondary | ICD-10-CM | POA: Diagnosis not present

## 2011-10-18 DIAGNOSIS — S96819A Strain of other specified muscles and tendons at ankle and foot level, unspecified foot, initial encounter: Secondary | ICD-10-CM | POA: Diagnosis not present

## 2011-10-18 DIAGNOSIS — S93499A Sprain of other ligament of unspecified ankle, initial encounter: Secondary | ICD-10-CM | POA: Diagnosis not present

## 2011-10-18 DIAGNOSIS — M9981 Other biomechanical lesions of cervical region: Secondary | ICD-10-CM | POA: Diagnosis not present

## 2011-10-18 DIAGNOSIS — M5137 Other intervertebral disc degeneration, lumbosacral region: Secondary | ICD-10-CM | POA: Diagnosis not present

## 2011-10-18 DIAGNOSIS — M5030 Other cervical disc degeneration, unspecified cervical region: Secondary | ICD-10-CM | POA: Diagnosis not present

## 2011-10-18 DIAGNOSIS — M999 Biomechanical lesion, unspecified: Secondary | ICD-10-CM | POA: Diagnosis not present

## 2011-10-20 DIAGNOSIS — M5030 Other cervical disc degeneration, unspecified cervical region: Secondary | ICD-10-CM | POA: Diagnosis not present

## 2011-10-20 DIAGNOSIS — M5137 Other intervertebral disc degeneration, lumbosacral region: Secondary | ICD-10-CM | POA: Diagnosis not present

## 2011-10-20 DIAGNOSIS — M999 Biomechanical lesion, unspecified: Secondary | ICD-10-CM | POA: Diagnosis not present

## 2011-10-20 DIAGNOSIS — M9981 Other biomechanical lesions of cervical region: Secondary | ICD-10-CM | POA: Diagnosis not present

## 2011-10-21 DIAGNOSIS — S93499A Sprain of other ligament of unspecified ankle, initial encounter: Secondary | ICD-10-CM | POA: Diagnosis not present

## 2011-10-25 DIAGNOSIS — S93499A Sprain of other ligament of unspecified ankle, initial encounter: Secondary | ICD-10-CM | POA: Diagnosis not present

## 2011-10-25 DIAGNOSIS — S96819A Strain of other specified muscles and tendons at ankle and foot level, unspecified foot, initial encounter: Secondary | ICD-10-CM | POA: Diagnosis not present

## 2011-10-26 DIAGNOSIS — M5030 Other cervical disc degeneration, unspecified cervical region: Secondary | ICD-10-CM | POA: Diagnosis not present

## 2011-10-26 DIAGNOSIS — M5137 Other intervertebral disc degeneration, lumbosacral region: Secondary | ICD-10-CM | POA: Diagnosis not present

## 2011-10-26 DIAGNOSIS — M999 Biomechanical lesion, unspecified: Secondary | ICD-10-CM | POA: Diagnosis not present

## 2011-10-26 DIAGNOSIS — M9981 Other biomechanical lesions of cervical region: Secondary | ICD-10-CM | POA: Diagnosis not present

## 2011-10-28 DIAGNOSIS — S93499A Sprain of other ligament of unspecified ankle, initial encounter: Secondary | ICD-10-CM | POA: Diagnosis not present

## 2011-10-28 DIAGNOSIS — S96819A Strain of other specified muscles and tendons at ankle and foot level, unspecified foot, initial encounter: Secondary | ICD-10-CM | POA: Diagnosis not present

## 2011-11-01 DIAGNOSIS — S96819A Strain of other specified muscles and tendons at ankle and foot level, unspecified foot, initial encounter: Secondary | ICD-10-CM | POA: Diagnosis not present

## 2011-11-01 DIAGNOSIS — S93499A Sprain of other ligament of unspecified ankle, initial encounter: Secondary | ICD-10-CM | POA: Diagnosis not present

## 2011-11-02 DIAGNOSIS — M9981 Other biomechanical lesions of cervical region: Secondary | ICD-10-CM | POA: Diagnosis not present

## 2011-11-02 DIAGNOSIS — M5030 Other cervical disc degeneration, unspecified cervical region: Secondary | ICD-10-CM | POA: Diagnosis not present

## 2011-11-02 DIAGNOSIS — M999 Biomechanical lesion, unspecified: Secondary | ICD-10-CM | POA: Diagnosis not present

## 2011-11-02 DIAGNOSIS — M5137 Other intervertebral disc degeneration, lumbosacral region: Secondary | ICD-10-CM | POA: Diagnosis not present

## 2011-11-04 DIAGNOSIS — S93499A Sprain of other ligament of unspecified ankle, initial encounter: Secondary | ICD-10-CM | POA: Diagnosis not present

## 2011-11-04 DIAGNOSIS — S96819A Strain of other specified muscles and tendons at ankle and foot level, unspecified foot, initial encounter: Secondary | ICD-10-CM | POA: Diagnosis not present

## 2011-11-05 ENCOUNTER — Encounter: Payer: Self-pay | Admitting: *Deleted

## 2011-11-05 ENCOUNTER — Encounter: Payer: Medicare Other | Admitting: Gynecology

## 2011-11-05 DIAGNOSIS — I712 Thoracic aortic aneurysm, without rupture, unspecified: Secondary | ICD-10-CM | POA: Insufficient documentation

## 2011-11-05 DIAGNOSIS — I10 Essential (primary) hypertension: Secondary | ICD-10-CM | POA: Insufficient documentation

## 2011-11-08 ENCOUNTER — Ambulatory Visit (INDEPENDENT_AMBULATORY_CARE_PROVIDER_SITE_OTHER): Payer: Medicare Other | Admitting: Gynecology

## 2011-11-08 ENCOUNTER — Encounter: Payer: Self-pay | Admitting: Gynecology

## 2011-11-08 VITALS — BP 140/74 | Ht 64.25 in | Wt 164.0 lb

## 2011-11-08 DIAGNOSIS — N952 Postmenopausal atrophic vaginitis: Secondary | ICD-10-CM

## 2011-11-08 DIAGNOSIS — Z79890 Hormone replacement therapy (postmenopausal): Secondary | ICD-10-CM | POA: Diagnosis not present

## 2011-11-08 MED ORDER — ESTROGENS CONJUGATED 0.3 MG PO TABS: 0.3000 mg | ORAL_TABLET | Freq: Every day | ORAL | Status: DC

## 2011-11-08 NOTE — Patient Instructions (Signed)
Follow up in one year, sooner as needed. 

## 2011-11-08 NOTE — Progress Notes (Signed)
MICHAELEEN DOWN 11-Apr-1929 [id number]        78 y.o.  G2P2 for annual exam.  Several issues noted below.  Past medical history,surgical history, medications, allergies, family history and social history were all reviewed and documented in the EPIC chart. ROS:  Was performed and pertinent positives and negatives are included in the history.  Exam: Sherrilyn Rist assistant Filed Vitals:   11/08/11 1410  BP: 140/74  Height: 5' 4.25" (1.632 m)  Weight: 164 lb (74.39 kg)   General appearance  Normal Skin grossly normal Head/Neck normal with no cervical or supraclavicular adenopathy thyroid normal Lungs  clear Cardiac RR, without RMG Abdominal  soft, nontender, without masses, organomegaly or hernia Breasts  examined lying and sitting without masses, retractions, discharge or axillary adenopathy. Pelvic  Ext/BUS/vagina  normal with atrophic genital changes  Adnexa  Without masses or tenderness    Anus and perineum  normal   Rectovaginal  normal sphincter tone without palpated masses or tenderness.    Assessment/Plan:  78 y.o. G2P2 female for follow up exam.   1. ERT. Patient continues on Premarin 0.3 mg daily. She's been on this since her 32s. I again reviewed the risks/benefits. WHI study with increased risk of stroke heart attack DVT possible breast cancer risk reviewed as well as ACOG/NAMS statements for low studies for shortest period of time. She's 81 and the options to wean versus continuing reviewed. Patient feels very strongly that she wants to continue. She understands the risks and accepts he is stating she feels good on the medication and does not want to discontinue it. I refilled her Premarin 0.3 mg daily x1 year. 2. Pap smear. Last Pap smear 2011. No history of significant abnormal Pap smears before. She is status post hysterectomy over the age of 50 and we have agreed on stop screening. 3. Mammography.  Patient section due for follow up views now to look at an area that are  following she knows to schedule this. She'll continue to follow up with screening at their recommended interval. SBE monthly reviewed. 4. Bone density. DEXA 05/2009 was normal. We'll plan repeat into her 80s. 5. Colonoscopy. She has stopped receiving screening colonoscopies per their recommendation. 6. Health maintenance. Urinalysis ordered. The blood work done this is all done through her primary physician's office. Follow up one year, sooner as needed.    Dara Lords MD, 3:12 PM 11/08/2011

## 2011-11-09 DIAGNOSIS — IMO0002 Reserved for concepts with insufficient information to code with codable children: Secondary | ICD-10-CM | POA: Diagnosis not present

## 2011-11-09 DIAGNOSIS — M1710 Unilateral primary osteoarthritis, unspecified knee: Secondary | ICD-10-CM | POA: Diagnosis not present

## 2011-11-09 LAB — URINALYSIS W MICROSCOPIC + REFLEX CULTURE

## 2011-11-11 ENCOUNTER — Ambulatory Visit
Admission: RE | Admit: 2011-11-11 | Discharge: 2011-11-11 | Disposition: A | Discharge status: Home / Self Care | Payer: Medicare Other | Source: Ambulatory Visit | Attending: Internal Medicine | Admitting: Internal Medicine

## 2011-11-11 DIAGNOSIS — IMO0002 Reserved for concepts with insufficient information to code with codable children: Secondary | ICD-10-CM | POA: Diagnosis not present

## 2011-11-11 DIAGNOSIS — M1710 Unilateral primary osteoarthritis, unspecified knee: Secondary | ICD-10-CM | POA: Diagnosis not present

## 2011-11-11 DIAGNOSIS — N63 Unspecified lump in unspecified breast: Secondary | ICD-10-CM

## 2011-11-11 DIAGNOSIS — R928 Other abnormal and inconclusive findings on diagnostic imaging of breast: Secondary | ICD-10-CM | POA: Diagnosis not present

## 2011-11-16 DIAGNOSIS — M9981 Other biomechanical lesions of cervical region: Secondary | ICD-10-CM | POA: Diagnosis not present

## 2011-11-16 DIAGNOSIS — M999 Biomechanical lesion, unspecified: Secondary | ICD-10-CM | POA: Diagnosis not present

## 2011-11-16 DIAGNOSIS — M5030 Other cervical disc degeneration, unspecified cervical region: Secondary | ICD-10-CM | POA: Diagnosis not present

## 2011-11-16 DIAGNOSIS — M5137 Other intervertebral disc degeneration, lumbosacral region: Secondary | ICD-10-CM | POA: Diagnosis not present

## 2011-11-18 DIAGNOSIS — T8489XA Other specified complication of internal orthopedic prosthetic devices, implants and grafts, initial encounter: Secondary | ICD-10-CM | POA: Diagnosis not present

## 2011-11-22 DIAGNOSIS — M25579 Pain in unspecified ankle and joints of unspecified foot: Secondary | ICD-10-CM | POA: Diagnosis not present

## 2011-11-25 DIAGNOSIS — M25579 Pain in unspecified ankle and joints of unspecified foot: Secondary | ICD-10-CM | POA: Diagnosis not present

## 2011-11-26 ENCOUNTER — Other Ambulatory Visit: Payer: Self-pay | Admitting: Surgery

## 2011-11-26 DIAGNOSIS — I719 Aortic aneurysm of unspecified site, without rupture: Secondary | ICD-10-CM

## 2011-11-27 DIAGNOSIS — Z23 Encounter for immunization: Secondary | ICD-10-CM | POA: Diagnosis not present

## 2011-11-29 ENCOUNTER — Other Ambulatory Visit: Payer: Medicare Other

## 2011-11-29 DIAGNOSIS — M999 Biomechanical lesion, unspecified: Secondary | ICD-10-CM | POA: Diagnosis not present

## 2011-11-29 DIAGNOSIS — M5137 Other intervertebral disc degeneration, lumbosacral region: Secondary | ICD-10-CM | POA: Diagnosis not present

## 2011-11-29 DIAGNOSIS — M9981 Other biomechanical lesions of cervical region: Secondary | ICD-10-CM | POA: Diagnosis not present

## 2011-11-29 DIAGNOSIS — M5030 Other cervical disc degeneration, unspecified cervical region: Secondary | ICD-10-CM | POA: Diagnosis not present

## 2011-11-30 DIAGNOSIS — M1710 Unilateral primary osteoarthritis, unspecified knee: Secondary | ICD-10-CM | POA: Diagnosis not present

## 2011-11-30 DIAGNOSIS — IMO0002 Reserved for concepts with insufficient information to code with codable children: Secondary | ICD-10-CM | POA: Diagnosis not present

## 2011-12-01 DIAGNOSIS — I471 Supraventricular tachycardia: Secondary | ICD-10-CM | POA: Diagnosis not present

## 2011-12-01 DIAGNOSIS — I359 Nonrheumatic aortic valve disorder, unspecified: Secondary | ICD-10-CM | POA: Diagnosis not present

## 2011-12-01 DIAGNOSIS — I10 Essential (primary) hypertension: Secondary | ICD-10-CM | POA: Diagnosis not present

## 2011-12-03 DIAGNOSIS — M169 Osteoarthritis of hip, unspecified: Secondary | ICD-10-CM | POA: Diagnosis not present

## 2011-12-07 DIAGNOSIS — M169 Osteoarthritis of hip, unspecified: Secondary | ICD-10-CM | POA: Diagnosis not present

## 2011-12-10 DIAGNOSIS — M169 Osteoarthritis of hip, unspecified: Secondary | ICD-10-CM | POA: Diagnosis not present

## 2011-12-13 ENCOUNTER — Ambulatory Visit
Admission: RE | Admit: 2011-12-13 | Discharge: 2011-12-13 | Disposition: A | Discharge status: Home / Self Care | Payer: Medicare Other | Source: Ambulatory Visit | Attending: Surgery | Admitting: Surgery

## 2011-12-13 DIAGNOSIS — I712 Thoracic aortic aneurysm, without rupture: Secondary | ICD-10-CM | POA: Diagnosis not present

## 2011-12-13 DIAGNOSIS — I719 Aortic aneurysm of unspecified site, without rupture: Secondary | ICD-10-CM

## 2011-12-13 MED ORDER — GADOBENATE DIMEGLUMINE 529 MG/ML IV SOLN
14.0000 mL | Freq: Once | INTRAVENOUS | Status: AC | PRN
  Administered 2011-12-13: 14 mL via INTRAVENOUS

## 2011-12-14 ENCOUNTER — Encounter: Payer: Self-pay | Admitting: Surgery

## 2011-12-14 ENCOUNTER — Ambulatory Visit (INDEPENDENT_AMBULATORY_CARE_PROVIDER_SITE_OTHER): Payer: Medicare Other | Admitting: Surgery

## 2011-12-14 VITALS — BP 139/75 | HR 67 | Resp 18 | Ht 64.0 in | Wt 164.0 lb

## 2011-12-14 DIAGNOSIS — M9981 Other biomechanical lesions of cervical region: Secondary | ICD-10-CM | POA: Diagnosis not present

## 2011-12-14 DIAGNOSIS — M5137 Other intervertebral disc degeneration, lumbosacral region: Secondary | ICD-10-CM | POA: Diagnosis not present

## 2011-12-14 DIAGNOSIS — M169 Osteoarthritis of hip, unspecified: Secondary | ICD-10-CM | POA: Diagnosis not present

## 2011-12-14 DIAGNOSIS — M999 Biomechanical lesion, unspecified: Secondary | ICD-10-CM | POA: Diagnosis not present

## 2011-12-14 DIAGNOSIS — I712 Thoracic aortic aneurysm, without rupture: Secondary | ICD-10-CM | POA: Diagnosis not present

## 2011-12-14 DIAGNOSIS — M5030 Other cervical disc degeneration, unspecified cervical region: Secondary | ICD-10-CM | POA: Diagnosis not present

## 2011-12-14 NOTE — Progress Notes (Signed)
[address]            [place] [address]          [id number]      HPI:  The patient is here today for followup of an ascending aortic aneurysm. I last saw her on 01/06/2010 at which time an MR angiogram showed that the aneurysm was stable at 4.6 cm and had not changed over the previous 4 years. This has been followed since 1999 at which time it was 4 cm. She does have a family history of aortic dissection with 2 sisters dying of suspected aortic dissection in their 39's. She has a murmur of aortic insufficiency that has been followed by Dr. Verdis Prime. Since I last saw her she said that she has been feeling well overall. She did tear her Achilles tendon and was nonweightbearing for about 3 months but has recovered from that now. She denies any chest or back pain. She has remained physically active.  Current Outpatient Prescriptions  Medication Sig Dispense Refill  . carvedilol (COREG) 12.5 MG tablet Take 12.5 mg by mouth 2 (two) times daily. Takes 1/2 tablet in am and 1 tablet at night       . diltiazem (CARDIZEM CD) 240 MG 24 hr capsule Take 240 mg by mouth daily.        Marland Kitchen estrogens, conjugated, (PREMARIN) 0.3 MG tablet Take 1 tablet (0.3 mg total) by mouth daily.  90 tablet  4  . fish oil-omega-3 fatty acids 1000 MG capsule Take 2 g by mouth 2 (two) times daily.        . Glucos-Chond-Sterol-Fish Oil (GLUCOSAMINE CHONDROITIN PLUS PO) Take 2 tablets by mouth daily.        . hydrOXYzine (ATARAX/VISTARIL) 10 MG tablet Take 5 mg by mouth at bedtime.        . Multiple Vitamins-Minerals (PRESERVISION AREDS 2 PO) Take 1 capsule by mouth 2 (two) times daily.        Marland Kitchen OVER THE COUNTER MEDICATION Take 1 tablet by mouth daily. Vitamin C       . OVER THE COUNTER MEDICATION Take 1 tablet by mouth daily. Vitamin E       . OVER THE COUNTER MEDICATION Take 1 tablet by mouth daily. Vitamin B-complex       . valsartan (DIOVAN) 80 MG tablet Take 80 mg by mouth daily.            Physical Exam: BP 139/75  Pulse 67  Resp 18  Ht 5\' 4"  (1.626 m)  Wt 164 lb (74.39 kg)  BMI 28.15 kg/m2  SpO2 98% She looks well. Cardiac exam shows a regular rate and rhythm with a grade 1/6 diastolic murmur of aortic insufficiency. Lung sounds clear.  Diagnostic Tests:  *RADIOLOGY REPORT*   Clinical Data: Follow-up of aneurysmal disease of the ascending thoracic aorta.   MRA CHEST WITH OR WITHOUT CONTRAST 12/13/2011   Contrast: 14mL MULTIHANCE GADOBENATE DIMEGLUMINE 529 MG/ML IV SOLN   BUN and creatinine were obtained on site at [facility] at [address] Results:  BUN 30 mg/dL,  Creatinine 1.0 mg/dL.   Comparison: 01/06/2010   Findings: Aneurysmal disease of the ascending thoracic aorta is stable since the prior study.  Maximal diameter of the ascending thoracic aorta just above the aortic root remains 4.6 cm.  There is no evidence of involvement of  the sinuses of Valsalva.  Diameter of the distal ascending thoracic aorta just prior to the arch is 4.1 cm.  Descending thoracic aorta shows normal caliber of 2.8 cm. Proximal great vessels show normal patency and stable tortuosity with evidence of a bovine arch.  There is no evidence of aortic dissection, penetrating ulcers, intramural hemorrhage or mediastinal hemorrhage.  No aortic thrombus is identified.   Other evaluation shows a stable heart size and no evidence of pleural or pericardial fluid.  No incidental masses or enlarged lymph nodes identified.   IMPRESSION: Stable aneurysmal disease of the ascending thoracic aorta with maximal diameter of 4.6 cm.     Original Report Authenticated By: Reola Calkins, M.D.   Impression:  She has a stable 4.6 cm fusiform ascending aortic aneurysm that has not changed in size over the past 6 years. I stressed the importance of good blood pressure control. She does have some aortic insufficiency on exam that has been followed by Dr. Katrinka Blazing with  periodic echocardiogram. She is almost 78 years old and hopefully this aneurysm will never cause her any problem. Since it has been stable for the past 6 years we'll plan to check her again in 2 years.  Plan:  Return in 2 years for MR angiogram of the chest.

## 2011-12-20 DIAGNOSIS — M169 Osteoarthritis of hip, unspecified: Secondary | ICD-10-CM | POA: Diagnosis not present

## 2012-01-04 DIAGNOSIS — I10 Essential (primary) hypertension: Secondary | ICD-10-CM | POA: Diagnosis not present

## 2012-01-04 DIAGNOSIS — D649 Anemia, unspecified: Secondary | ICD-10-CM | POA: Diagnosis not present

## 2012-01-04 DIAGNOSIS — E782 Mixed hyperlipidemia: Secondary | ICD-10-CM | POA: Diagnosis not present

## 2012-01-04 DIAGNOSIS — N182 Chronic kidney disease, stage 2 (mild): Secondary | ICD-10-CM | POA: Diagnosis not present

## 2012-01-04 DIAGNOSIS — M76899 Other specified enthesopathies of unspecified lower limb, excluding foot: Secondary | ICD-10-CM | POA: Diagnosis not present

## 2012-01-06 DIAGNOSIS — D649 Anemia, unspecified: Secondary | ICD-10-CM | POA: Diagnosis not present

## 2012-01-11 DIAGNOSIS — M5137 Other intervertebral disc degeneration, lumbosacral region: Secondary | ICD-10-CM | POA: Diagnosis not present

## 2012-01-11 DIAGNOSIS — M9981 Other biomechanical lesions of cervical region: Secondary | ICD-10-CM | POA: Diagnosis not present

## 2012-01-11 DIAGNOSIS — M5030 Other cervical disc degeneration, unspecified cervical region: Secondary | ICD-10-CM | POA: Diagnosis not present

## 2012-01-11 DIAGNOSIS — M999 Biomechanical lesion, unspecified: Secondary | ICD-10-CM | POA: Diagnosis not present

## 2012-01-18 DIAGNOSIS — M9981 Other biomechanical lesions of cervical region: Secondary | ICD-10-CM | POA: Diagnosis not present

## 2012-01-18 DIAGNOSIS — M5137 Other intervertebral disc degeneration, lumbosacral region: Secondary | ICD-10-CM | POA: Diagnosis not present

## 2012-01-18 DIAGNOSIS — M999 Biomechanical lesion, unspecified: Secondary | ICD-10-CM | POA: Diagnosis not present

## 2012-01-18 DIAGNOSIS — M5030 Other cervical disc degeneration, unspecified cervical region: Secondary | ICD-10-CM | POA: Diagnosis not present

## 2012-01-20 DIAGNOSIS — B353 Tinea pedis: Secondary | ICD-10-CM | POA: Diagnosis not present

## 2012-01-20 DIAGNOSIS — Z85828 Personal history of other malignant neoplasm of skin: Secondary | ICD-10-CM | POA: Diagnosis not present

## 2012-02-01 DIAGNOSIS — M999 Biomechanical lesion, unspecified: Secondary | ICD-10-CM | POA: Diagnosis not present

## 2012-02-01 DIAGNOSIS — M5137 Other intervertebral disc degeneration, lumbosacral region: Secondary | ICD-10-CM | POA: Diagnosis not present

## 2012-02-01 DIAGNOSIS — M9981 Other biomechanical lesions of cervical region: Secondary | ICD-10-CM | POA: Diagnosis not present

## 2012-02-01 DIAGNOSIS — M5030 Other cervical disc degeneration, unspecified cervical region: Secondary | ICD-10-CM | POA: Diagnosis not present

## 2012-02-02 ENCOUNTER — Emergency Department (INDEPENDENT_AMBULATORY_CARE_PROVIDER_SITE_OTHER)
Admission: EM | Admit: 2012-02-02 | Discharge: 2012-02-02 | Disposition: A | Discharge status: Home / Self Care | Payer: Medicare Other | Source: Home / Self Care | Attending: Emergency Medicine | Admitting: Emergency Medicine

## 2012-02-02 ENCOUNTER — Encounter (HOSPITAL_COMMUNITY): Payer: Self-pay | Admitting: *Deleted

## 2012-02-02 DIAGNOSIS — S91009A Unspecified open wound, unspecified ankle, initial encounter: Secondary | ICD-10-CM | POA: Diagnosis not present

## 2012-02-02 DIAGNOSIS — S81009A Unspecified open wound, unspecified knee, initial encounter: Secondary | ICD-10-CM

## 2012-02-02 DIAGNOSIS — S81819A Laceration without foreign body, unspecified lower leg, initial encounter: Secondary | ICD-10-CM

## 2012-02-02 DIAGNOSIS — S81809A Unspecified open wound, unspecified lower leg, initial encounter: Secondary | ICD-10-CM | POA: Diagnosis not present

## 2012-02-02 DIAGNOSIS — Z23 Encounter for immunization: Secondary | ICD-10-CM | POA: Diagnosis not present

## 2012-02-02 MED ORDER — TETANUS-DIPHTH-ACELL PERTUSSIS 5-2.5-18.5 LF-MCG/0.5 IM SUSP
0.5000 mL | Freq: Once | INTRAMUSCULAR | Status: AC
  Administered 2012-02-02: 0.5 mL via INTRAMUSCULAR

## 2012-02-02 MED ORDER — TETANUS-DIPHTH-ACELL PERTUSSIS 5-2.5-18.5 LF-MCG/0.5 IM SUSP
INTRAMUSCULAR | Status: AC
  Filled 2012-02-02: qty 0.5

## 2012-02-02 NOTE — ED Notes (Signed)
Pt reports falling over metal cart at [employer] and has skin tear to lower left leg - denies other injury

## 2012-02-02 NOTE — ED Provider Notes (Signed)
Chief Complaint  Patient presents with  . Extremity Laceration    History of Present Illness:  The patient is an 78 year old female who today lacerated her left lower leg on a shopping cart at [employer] store. She has a large skin tear measuring 7 cm x 2 cm. She denies any numbness or tingling. She cannot recall when her last tetanus vaccine was.  Review of Systems:  Other than noted above, the patient denies any of the following symptoms: Systemic:  No fever or chills. Musculoskeletal:  No joint pain or decreased range of motion. Neuro:  No numbness, tingling, or weakness.  PMFSH:  Past medical history, family history, social history, meds, and allergies were reviewed.  Physical Exam:   Vital signs:  BP 192/86  Pulse 68  Temp 96.8 F (36 C) (Oral)  Resp 16  SpO2 98% Ext:  There is a 7 cm x 2.5 cm skin tear on the left lower leg.  All joints had a full ROM without pain.  Pulses were full.  Good capillary refill in all digits.  No edema. Neurological:  Alert and oriented.  No muscle weakness.  Sensation was intact to light touch.     Procedure: Verbal informed consent was obtained.  The patient was informed of the risks and benefits of the procedure and understands and accepts.  Identity of the patient was verified verbally and by wristband.   The laceration was cleansed with saline and bleeding was controlled with steady pressure. Tincture of benzoin was applied around the wound edges and the wound edges were approximated with 6 Steri-Strips. A sterile dressing was then applied followed by an Ace wrap.  Medications given in [facility]:  She was given a Tdap vaccine and tolerated this well without any immediate side effects.  Assessment:  The encounter diagnosis was Skin tear of lower leg without complication.  Plan:   1.  The following meds were prescribed:   New Prescriptions   No medications on file   2.  The patient was instructed in wound care and pain control, and  handouts were given. 3.  The patient was told to return if any sign of infection.    Reuben Likes, MD 02/02/12 [phone]

## 2012-02-15 DIAGNOSIS — M5137 Other intervertebral disc degeneration, lumbosacral region: Secondary | ICD-10-CM | POA: Diagnosis not present

## 2012-02-15 DIAGNOSIS — M5030 Other cervical disc degeneration, unspecified cervical region: Secondary | ICD-10-CM | POA: Diagnosis not present

## 2012-02-15 DIAGNOSIS — M999 Biomechanical lesion, unspecified: Secondary | ICD-10-CM | POA: Diagnosis not present

## 2012-02-15 DIAGNOSIS — M9981 Other biomechanical lesions of cervical region: Secondary | ICD-10-CM | POA: Diagnosis not present

## 2012-02-16 ENCOUNTER — Emergency Department (INDEPENDENT_AMBULATORY_CARE_PROVIDER_SITE_OTHER)
Admission: EM | Admit: 2012-02-16 | Discharge: 2012-02-16 | Disposition: A | Discharge status: Home / Self Care | Payer: Medicare Other | Source: Home / Self Care | Attending: Emergency Medicine | Admitting: Emergency Medicine

## 2012-02-16 ENCOUNTER — Encounter (HOSPITAL_COMMUNITY): Payer: Self-pay | Admitting: *Deleted

## 2012-02-16 DIAGNOSIS — L089 Local infection of the skin and subcutaneous tissue, unspecified: Secondary | ICD-10-CM

## 2012-02-16 DIAGNOSIS — T07XXXA Unspecified multiple injuries, initial encounter: Secondary | ICD-10-CM | POA: Diagnosis not present

## 2012-02-16 MED ORDER — CLINDAMYCIN HCL 300 MG PO CAPS: 300.0000 mg | ORAL_CAPSULE | Freq: Four times a day (QID) | ORAL | Status: DC

## 2012-02-16 NOTE — ED Notes (Signed)
Patient called, as she was of the understanding she was to have an antibiotic cream to apply to her leg. No record of this to be found on chart. Spoke w Dr Artis Flock, who felt the antibiotic pills as written should be sufficient to cover her infection control issues on her leg. Called to patient and remains quite concerned about need , so she will call tomorrow after 1 PM to have staff inquire about her Rx

## 2012-02-16 NOTE — ED Provider Notes (Signed)
Chief Complaint  Patient presents with  . Wound Check    History of Present Illness:  [name] is an 78 year old female who is back today for followup on a laceration sustained to the left, lower pretibial area about 2 weeks ago. She was seen here for the skin tear and it was Steri-Stripped. She has left Steri-Strips on since then and been washing with soap and water. She has noted some drainage which is serosanguineous and there is erythema just below the wound for the past 2 days. It's somewhat tender to touch. She denies any fever or chills. There is no red streak running up her leg.  Review of Systems:  Other than noted above, the patient denies any of the following symptoms: Systemic:  No fever or chills. Musculoskeletal:  No joint pain or decreased range of motion. Neuro:  No numbness, tingling, or weakness.  PMFSH:  Past medical history, family history, social history, meds, and allergies were reviewed.  Physical Exam:   Vital signs:  BP 156/78  Pulse 66  Temp 99.7 F (37.6 C) (Oral)  Resp 18  SpO2 97% Ext:  The Steri-Strips are still in place. There is serosanguineous drainage which is not malodorous. Steri-Strips were removed and there was some necrosis of the skin flap. Just below this there was mild erythema and swelling and slight tenderness to palpation extending down to the ankle but not below the ankle and there was no erythema or swelling above the lesion.  All joints had a full ROM without pain.  Pulses were full.  Good capillary refill in all digits.  No edema. Neurological:  Alert and oriented.  No muscle weakness.  Sensation was intact to light touch.     Procedure: Verbal informed consent was obtained.  The patient was informed of the risks and benefits of the procedure and understands and accepts.  Identity of the patient was verified verbally and by wristband.   The laceration area described above was cleansed with saline, and the necrotic tissue was snipped  off an antibiotic ointment was applied and a nonstick dressing.  Assessment:  The encounter diagnosis was Infected skin tear.  Since she is allergic to cephalexin, I will not give her IM Rocephin. She was given by mouth clindamycin his return again in 48 hours for recheck. I suggested that she go to the emergency room if she should develop any fever, vomiting, or worsening pain. Next week I would like to get her in to the wound care clinic for followup and debridement of this lesion. It will probably take a long time to heal up.  Plan:   1.  The following meds were prescribed:   New Prescriptions   CLINDAMYCIN (CLEOCIN) 300 MG CAPSULE    Take 1 capsule (300 mg total) by mouth 4 (four) times daily.   2.  The patient was instructed in wound care and pain control, and handouts were given. 3.  The patient was told to return in 2 days for wound recheck or sooner if any sign of infection.     Reuben Likes, MD 02/16/12 [phone]

## 2012-02-16 NOTE — ED Notes (Signed)
Pt  Reports   She  Is  Here  For  Wound  Check of  Her         Lower  Leg  She  Has  Steri  Strips  In place  She    Has  Some  Redness  Below  The  Site   And  It is  Becoming  painfull           To  The  Touch

## 2012-02-17 ENCOUNTER — Encounter (HOSPITAL_COMMUNITY): Payer: Self-pay | Admitting: Emergency Medicine

## 2012-02-17 ENCOUNTER — Telehealth (HOSPITAL_COMMUNITY): Payer: Self-pay | Admitting: *Deleted

## 2012-02-17 ENCOUNTER — Emergency Department (INDEPENDENT_AMBULATORY_CARE_PROVIDER_SITE_OTHER)
Admission: EM | Admit: 2012-02-17 | Discharge: 2012-02-17 | Disposition: A | Discharge status: Home / Self Care | Payer: Medicare Other | Source: Home / Self Care | Attending: Emergency Medicine | Admitting: Emergency Medicine

## 2012-02-17 DIAGNOSIS — T07XXXA Unspecified multiple injuries, initial encounter: Secondary | ICD-10-CM | POA: Diagnosis not present

## 2012-02-17 DIAGNOSIS — T7840XA Allergy, unspecified, initial encounter: Secondary | ICD-10-CM

## 2012-02-17 DIAGNOSIS — L089 Local infection of the skin and subcutaneous tissue, unspecified: Secondary | ICD-10-CM

## 2012-02-17 MED ORDER — DOXYCYCLINE HYCLATE 100 MG PO TABS: 100.0000 mg | ORAL_TABLET | Freq: Two times a day (BID) | ORAL | Status: DC

## 2012-02-17 MED ORDER — MUPIROCIN 2 % EX OINT: TOPICAL_OINTMENT | Freq: Three times a day (TID) | CUTANEOUS | Status: DC

## 2012-02-17 NOTE — Telephone Encounter (Signed)
Message copied by Baron Hamper on Thu Feb 17, 2012  5:22 PM ------      Message from: Lorenz Coaster, DAVID C      Created: Wed Feb 16, 2012 11:39 AM       Alinda Money,            Needs appointment at wound care center.            Glory Rosebush

## 2012-02-17 NOTE — ED Notes (Signed)
Called  Wound  Care  Center  To  Make  An  appt   Left  Message  - dr  Lorenz Coaster then told  Me  He  Had  Already  Made  An appt  For  This  Pt

## 2012-02-17 NOTE — ED Provider Notes (Signed)
Chief Complaint  Patient presents with  . Allergic Reaction    given antibiotic for infected leg wound and broke out in a rash. pt has stopped     History of Present Illness:  The patient was just seen yesterday because of infection of the skin tear that she sustained about 2 weeks ago. She was given clindamycin. She took 3 doses and then broke out in a generalized rash. This is not itchy. She didn't have any difficulty breathing or swelling of lips, tongue, or throat. The wound in the leg seems about the same. It's no better or no worse. She's had no fever or chills. She has had no prior reaction to clindamycin.  Review of Systems:  Other than noted above, the patient denies any of the following symptoms: Systemic:  No fever or chills. Musculoskeletal:  No joint pain or decreased range of motion. Neuro:  No numbness, tingling, or weakness.  PMFSH:  Past medical history, family history, social history, meds, and allergies were reviewed.  Physical Exam:   Vital signs:  BP 143/80  Pulse 78  Temp 98 F (36.7 C) (Oral)  Resp 18  SpO2 96% ENT: Pharynx is clear, no swelling of the throat, tongue, or lips. Lungs: Clear to auscultation. Ext:  The wound was about the same as it did yesterday. There still some erythema distal to the wound. It is definitely not progressed since yesterday,  All joints had a full ROM without pain.  Pulses were full.  Good capillary refill in all digits.  No edema. Neurological:  Alert and oriented.  No muscle weakness.  Sensation was intact to light touch.  Skin: She has a fine, erythematous, maculopapular rash on the trunk. No lesions on the palms or soles or on the mucous membranes.  Assessment:  The primary encounter diagnosis was Infected skin tear. A diagnosis of Allergic reaction caused by a drug was also pertinent to this visit.  Plan:   1.  The following meds were prescribed:   New Prescriptions   DOXYCYCLINE (VIBRA-TABS) 100 MG TABLET    Take 1 tablet  (100 mg total) by mouth 2 (two) times daily.   MUPIROCIN OINTMENT (BACTROBAN) 2 %    Apply topically 3 (three) times daily.   2.  The patient was instructed in wound care and pain control, and handouts were given. She was told to stop the clindamycin completely. Notation was made in her chart that she is allergic to it now. She also has a number of other allergies. It looks like doxycycline is the best thing to give her at this point. Her cultures are not back yet. 3.  The patient was told to return in 5 days for wound recheck or sooner if any sign of infection. She also has an appointment at the wound care center on December 26. They will be calling to let her know.     Reuben Likes, MD 02/17/12 [phone]

## 2012-02-17 NOTE — ED Notes (Signed)
Pt c/o allergic reaction to clydamycin. Pt has rash on chest and abdomen.  Some mild irritation. Pt did stop taking med.   Pt was seen 12/4 for skin tear and prescribed antibiotic

## 2012-02-18 DIAGNOSIS — Z4800 Encounter for change or removal of nonsurgical wound dressing: Secondary | ICD-10-CM | POA: Diagnosis not present

## 2012-02-18 LAB — WOUND CULTURE: Gram Stain: NONE SEEN

## 2012-02-18 NOTE — ED Notes (Signed)
Call from wound center to verify information

## 2012-03-01 DIAGNOSIS — M5030 Other cervical disc degeneration, unspecified cervical region: Secondary | ICD-10-CM | POA: Diagnosis not present

## 2012-03-01 DIAGNOSIS — M999 Biomechanical lesion, unspecified: Secondary | ICD-10-CM | POA: Diagnosis not present

## 2012-03-01 DIAGNOSIS — M9981 Other biomechanical lesions of cervical region: Secondary | ICD-10-CM | POA: Diagnosis not present

## 2012-03-01 DIAGNOSIS — M5137 Other intervertebral disc degeneration, lumbosacral region: Secondary | ICD-10-CM | POA: Diagnosis not present

## 2012-03-20 DIAGNOSIS — M999 Biomechanical lesion, unspecified: Secondary | ICD-10-CM | POA: Diagnosis not present

## 2012-03-20 DIAGNOSIS — M5137 Other intervertebral disc degeneration, lumbosacral region: Secondary | ICD-10-CM | POA: Diagnosis not present

## 2012-03-20 DIAGNOSIS — M9981 Other biomechanical lesions of cervical region: Secondary | ICD-10-CM | POA: Diagnosis not present

## 2012-03-20 DIAGNOSIS — M5030 Other cervical disc degeneration, unspecified cervical region: Secondary | ICD-10-CM | POA: Diagnosis not present

## 2012-04-18 DIAGNOSIS — M5030 Other cervical disc degeneration, unspecified cervical region: Secondary | ICD-10-CM | POA: Diagnosis not present

## 2012-04-18 DIAGNOSIS — M999 Biomechanical lesion, unspecified: Secondary | ICD-10-CM | POA: Diagnosis not present

## 2012-04-18 DIAGNOSIS — M9981 Other biomechanical lesions of cervical region: Secondary | ICD-10-CM | POA: Diagnosis not present

## 2012-04-18 DIAGNOSIS — M5137 Other intervertebral disc degeneration, lumbosacral region: Secondary | ICD-10-CM | POA: Diagnosis not present

## 2012-05-09 DIAGNOSIS — M999 Biomechanical lesion, unspecified: Secondary | ICD-10-CM | POA: Diagnosis not present

## 2012-05-09 DIAGNOSIS — M9981 Other biomechanical lesions of cervical region: Secondary | ICD-10-CM | POA: Diagnosis not present

## 2012-05-09 DIAGNOSIS — M5030 Other cervical disc degeneration, unspecified cervical region: Secondary | ICD-10-CM | POA: Diagnosis not present

## 2012-05-09 DIAGNOSIS — M5137 Other intervertebral disc degeneration, lumbosacral region: Secondary | ICD-10-CM | POA: Diagnosis not present

## 2012-05-11 DIAGNOSIS — H353190 Nonexudative age-related macular degeneration, unspecified eye, stage unspecified: Secondary | ICD-10-CM | POA: Diagnosis not present

## 2012-05-11 DIAGNOSIS — H2640 Unspecified secondary cataract: Secondary | ICD-10-CM | POA: Diagnosis not present

## 2012-05-11 DIAGNOSIS — Z961 Presence of intraocular lens: Secondary | ICD-10-CM | POA: Diagnosis not present

## 2012-05-17 DIAGNOSIS — M25579 Pain in unspecified ankle and joints of unspecified foot: Secondary | ICD-10-CM | POA: Diagnosis not present

## 2012-05-23 DIAGNOSIS — M5137 Other intervertebral disc degeneration, lumbosacral region: Secondary | ICD-10-CM | POA: Diagnosis not present

## 2012-05-23 DIAGNOSIS — M999 Biomechanical lesion, unspecified: Secondary | ICD-10-CM | POA: Diagnosis not present

## 2012-05-23 DIAGNOSIS — M9981 Other biomechanical lesions of cervical region: Secondary | ICD-10-CM | POA: Diagnosis not present

## 2012-05-23 DIAGNOSIS — M5030 Other cervical disc degeneration, unspecified cervical region: Secondary | ICD-10-CM | POA: Diagnosis not present

## 2012-06-06 DIAGNOSIS — M5137 Other intervertebral disc degeneration, lumbosacral region: Secondary | ICD-10-CM | POA: Diagnosis not present

## 2012-06-06 DIAGNOSIS — M9981 Other biomechanical lesions of cervical region: Secondary | ICD-10-CM | POA: Diagnosis not present

## 2012-06-06 DIAGNOSIS — M999 Biomechanical lesion, unspecified: Secondary | ICD-10-CM | POA: Diagnosis not present

## 2012-06-06 DIAGNOSIS — M5030 Other cervical disc degeneration, unspecified cervical region: Secondary | ICD-10-CM | POA: Diagnosis not present

## 2012-06-20 DIAGNOSIS — M999 Biomechanical lesion, unspecified: Secondary | ICD-10-CM | POA: Diagnosis not present

## 2012-06-20 DIAGNOSIS — M5137 Other intervertebral disc degeneration, lumbosacral region: Secondary | ICD-10-CM | POA: Diagnosis not present

## 2012-06-20 DIAGNOSIS — M5030 Other cervical disc degeneration, unspecified cervical region: Secondary | ICD-10-CM | POA: Diagnosis not present

## 2012-06-20 DIAGNOSIS — M9981 Other biomechanical lesions of cervical region: Secondary | ICD-10-CM | POA: Diagnosis not present

## 2012-06-26 ENCOUNTER — Other Ambulatory Visit: Payer: Self-pay

## 2012-06-26 DIAGNOSIS — Z1231 Encounter for screening mammogram for malignant neoplasm of breast: Secondary | ICD-10-CM

## 2012-06-27 ENCOUNTER — Other Ambulatory Visit: Payer: Self-pay | Admitting: Internal Medicine

## 2012-06-27 DIAGNOSIS — E2839 Other primary ovarian failure: Secondary | ICD-10-CM

## 2012-07-04 DIAGNOSIS — M5030 Other cervical disc degeneration, unspecified cervical region: Secondary | ICD-10-CM | POA: Diagnosis not present

## 2012-07-04 DIAGNOSIS — M999 Biomechanical lesion, unspecified: Secondary | ICD-10-CM | POA: Diagnosis not present

## 2012-07-04 DIAGNOSIS — M9981 Other biomechanical lesions of cervical region: Secondary | ICD-10-CM | POA: Diagnosis not present

## 2012-07-04 DIAGNOSIS — M5137 Other intervertebral disc degeneration, lumbosacral region: Secondary | ICD-10-CM | POA: Diagnosis not present

## 2012-07-18 DIAGNOSIS — M5030 Other cervical disc degeneration, unspecified cervical region: Secondary | ICD-10-CM | POA: Diagnosis not present

## 2012-07-18 DIAGNOSIS — M5137 Other intervertebral disc degeneration, lumbosacral region: Secondary | ICD-10-CM | POA: Diagnosis not present

## 2012-07-18 DIAGNOSIS — M999 Biomechanical lesion, unspecified: Secondary | ICD-10-CM | POA: Diagnosis not present

## 2012-07-18 DIAGNOSIS — M9981 Other biomechanical lesions of cervical region: Secondary | ICD-10-CM | POA: Diagnosis not present

## 2012-07-25 ENCOUNTER — Ambulatory Visit: Payer: Medicare Other

## 2012-07-28 ENCOUNTER — Ambulatory Visit
Admission: RE | Admit: 2012-07-28 | Discharge: 2012-07-28 | Disposition: A | Discharge status: Home / Self Care | Payer: Medicare Other | Source: Ambulatory Visit | Attending: Internal Medicine | Admitting: Internal Medicine

## 2012-07-28 ENCOUNTER — Ambulatory Visit
Admission: RE | Admit: 2012-07-28 | Discharge: 2012-07-28 | Disposition: A | Discharge status: Home / Self Care | Payer: Medicare Other | Source: Ambulatory Visit

## 2012-07-28 ENCOUNTER — Other Ambulatory Visit: Payer: Self-pay | Admitting: Internal Medicine

## 2012-07-28 ENCOUNTER — Other Ambulatory Visit: Payer: Self-pay

## 2012-07-28 DIAGNOSIS — N6310 Unspecified lump in the right breast, unspecified quadrant: Secondary | ICD-10-CM

## 2012-07-28 DIAGNOSIS — Z1231 Encounter for screening mammogram for malignant neoplasm of breast: Secondary | ICD-10-CM

## 2012-07-28 DIAGNOSIS — Z780 Asymptomatic menopausal state: Secondary | ICD-10-CM | POA: Diagnosis not present

## 2012-07-28 DIAGNOSIS — N6459 Other signs and symptoms in breast: Secondary | ICD-10-CM | POA: Diagnosis not present

## 2012-07-28 DIAGNOSIS — E2839 Other primary ovarian failure: Secondary | ICD-10-CM

## 2012-07-28 DIAGNOSIS — R599 Enlarged lymph nodes, unspecified: Secondary | ICD-10-CM | POA: Diagnosis not present

## 2012-08-01 DIAGNOSIS — M9981 Other biomechanical lesions of cervical region: Secondary | ICD-10-CM | POA: Diagnosis not present

## 2012-08-01 DIAGNOSIS — M5137 Other intervertebral disc degeneration, lumbosacral region: Secondary | ICD-10-CM | POA: Diagnosis not present

## 2012-08-01 DIAGNOSIS — M5030 Other cervical disc degeneration, unspecified cervical region: Secondary | ICD-10-CM | POA: Diagnosis not present

## 2012-08-01 DIAGNOSIS — M999 Biomechanical lesion, unspecified: Secondary | ICD-10-CM | POA: Diagnosis not present

## 2012-08-15 DIAGNOSIS — M9981 Other biomechanical lesions of cervical region: Secondary | ICD-10-CM | POA: Diagnosis not present

## 2012-08-15 DIAGNOSIS — M5030 Other cervical disc degeneration, unspecified cervical region: Secondary | ICD-10-CM | POA: Diagnosis not present

## 2012-08-15 DIAGNOSIS — M5137 Other intervertebral disc degeneration, lumbosacral region: Secondary | ICD-10-CM | POA: Diagnosis not present

## 2012-08-15 DIAGNOSIS — M999 Biomechanical lesion, unspecified: Secondary | ICD-10-CM | POA: Diagnosis not present

## 2012-08-24 DIAGNOSIS — Z96649 Presence of unspecified artificial hip joint: Secondary | ICD-10-CM | POA: Diagnosis not present

## 2012-08-24 DIAGNOSIS — Z96659 Presence of unspecified artificial knee joint: Secondary | ICD-10-CM | POA: Diagnosis not present

## 2012-08-29 DIAGNOSIS — M9981 Other biomechanical lesions of cervical region: Secondary | ICD-10-CM | POA: Diagnosis not present

## 2012-08-29 DIAGNOSIS — M999 Biomechanical lesion, unspecified: Secondary | ICD-10-CM | POA: Diagnosis not present

## 2012-08-29 DIAGNOSIS — M5137 Other intervertebral disc degeneration, lumbosacral region: Secondary | ICD-10-CM | POA: Diagnosis not present

## 2012-08-29 DIAGNOSIS — M5030 Other cervical disc degeneration, unspecified cervical region: Secondary | ICD-10-CM | POA: Diagnosis not present

## 2012-09-11 DIAGNOSIS — N183 Chronic kidney disease, stage 3 unspecified: Secondary | ICD-10-CM | POA: Diagnosis not present

## 2012-09-11 DIAGNOSIS — Z0000 Encounter for general adult medical examination without abnormal findings: Secondary | ICD-10-CM | POA: Diagnosis not present

## 2012-09-11 DIAGNOSIS — I10 Essential (primary) hypertension: Secondary | ICD-10-CM | POA: Diagnosis not present

## 2012-09-11 DIAGNOSIS — D649 Anemia, unspecified: Secondary | ICD-10-CM | POA: Diagnosis not present

## 2012-09-19 DIAGNOSIS — M999 Biomechanical lesion, unspecified: Secondary | ICD-10-CM | POA: Diagnosis not present

## 2012-09-19 DIAGNOSIS — M9981 Other biomechanical lesions of cervical region: Secondary | ICD-10-CM | POA: Diagnosis not present

## 2012-09-19 DIAGNOSIS — M5137 Other intervertebral disc degeneration, lumbosacral region: Secondary | ICD-10-CM | POA: Diagnosis not present

## 2012-09-19 DIAGNOSIS — M5030 Other cervical disc degeneration, unspecified cervical region: Secondary | ICD-10-CM | POA: Diagnosis not present

## 2012-10-03 DIAGNOSIS — M5030 Other cervical disc degeneration, unspecified cervical region: Secondary | ICD-10-CM | POA: Diagnosis not present

## 2012-10-03 DIAGNOSIS — M5137 Other intervertebral disc degeneration, lumbosacral region: Secondary | ICD-10-CM | POA: Diagnosis not present

## 2012-10-03 DIAGNOSIS — M9981 Other biomechanical lesions of cervical region: Secondary | ICD-10-CM | POA: Diagnosis not present

## 2012-10-03 DIAGNOSIS — M999 Biomechanical lesion, unspecified: Secondary | ICD-10-CM | POA: Diagnosis not present

## 2012-10-10 DIAGNOSIS — Z96649 Presence of unspecified artificial hip joint: Secondary | ICD-10-CM | POA: Diagnosis not present

## 2012-10-10 DIAGNOSIS — M25559 Pain in unspecified hip: Secondary | ICD-10-CM | POA: Diagnosis not present

## 2012-10-14 DIAGNOSIS — M25559 Pain in unspecified hip: Secondary | ICD-10-CM | POA: Diagnosis not present

## 2012-10-17 DIAGNOSIS — M999 Biomechanical lesion, unspecified: Secondary | ICD-10-CM | POA: Diagnosis not present

## 2012-10-17 DIAGNOSIS — M5137 Other intervertebral disc degeneration, lumbosacral region: Secondary | ICD-10-CM | POA: Diagnosis not present

## 2012-10-17 DIAGNOSIS — M9981 Other biomechanical lesions of cervical region: Secondary | ICD-10-CM | POA: Diagnosis not present

## 2012-10-17 DIAGNOSIS — M5030 Other cervical disc degeneration, unspecified cervical region: Secondary | ICD-10-CM | POA: Diagnosis not present

## 2012-10-26 DIAGNOSIS — M25559 Pain in unspecified hip: Secondary | ICD-10-CM | POA: Diagnosis not present

## 2012-10-31 DIAGNOSIS — M5030 Other cervical disc degeneration, unspecified cervical region: Secondary | ICD-10-CM | POA: Diagnosis not present

## 2012-10-31 DIAGNOSIS — M9981 Other biomechanical lesions of cervical region: Secondary | ICD-10-CM | POA: Diagnosis not present

## 2012-10-31 DIAGNOSIS — M5137 Other intervertebral disc degeneration, lumbosacral region: Secondary | ICD-10-CM | POA: Diagnosis not present

## 2012-10-31 DIAGNOSIS — M999 Biomechanical lesion, unspecified: Secondary | ICD-10-CM | POA: Diagnosis not present

## 2012-11-08 DIAGNOSIS — L570 Actinic keratosis: Secondary | ICD-10-CM | POA: Diagnosis not present

## 2012-11-08 DIAGNOSIS — Z85828 Personal history of other malignant neoplasm of skin: Secondary | ICD-10-CM | POA: Diagnosis not present

## 2012-11-08 DIAGNOSIS — B070 Plantar wart: Secondary | ICD-10-CM | POA: Diagnosis not present

## 2012-11-14 ENCOUNTER — Other Ambulatory Visit: Payer: Self-pay | Admitting: Gynecology

## 2012-11-14 ENCOUNTER — Other Ambulatory Visit: Payer: Self-pay | Admitting: Orthopedic Surgery

## 2012-11-14 DIAGNOSIS — M999 Biomechanical lesion, unspecified: Secondary | ICD-10-CM | POA: Diagnosis not present

## 2012-11-14 DIAGNOSIS — M5030 Other cervical disc degeneration, unspecified cervical region: Secondary | ICD-10-CM | POA: Diagnosis not present

## 2012-11-14 DIAGNOSIS — M9981 Other biomechanical lesions of cervical region: Secondary | ICD-10-CM | POA: Diagnosis not present

## 2012-11-14 DIAGNOSIS — M5137 Other intervertebral disc degeneration, lumbosacral region: Secondary | ICD-10-CM | POA: Diagnosis not present

## 2012-11-15 ENCOUNTER — Encounter (HOSPITAL_COMMUNITY): Payer: Self-pay | Admitting: Pharmacy Technician

## 2012-11-15 DIAGNOSIS — Z23 Encounter for immunization: Secondary | ICD-10-CM | POA: Diagnosis not present

## 2012-11-20 ENCOUNTER — Other Ambulatory Visit (HOSPITAL_COMMUNITY): Payer: Medicare Other

## 2012-11-21 ENCOUNTER — Ambulatory Visit (HOSPITAL_COMMUNITY)
Admission: RE | Admit: 2012-11-21 | Discharge: 2012-11-21 | Disposition: A | Discharge status: Home / Self Care | Payer: Medicare Other | Source: Ambulatory Visit | Attending: Orthopedic Surgery | Admitting: Orthopedic Surgery

## 2012-11-21 ENCOUNTER — Encounter (HOSPITAL_COMMUNITY): Payer: Self-pay

## 2012-11-21 ENCOUNTER — Encounter (HOSPITAL_COMMUNITY)
Admission: RE | Admit: 2012-11-21 | Discharge: 2012-11-21 | Disposition: A | Discharge status: Home / Self Care | Payer: Medicare Other | Source: Ambulatory Visit | Attending: Orthopedic Surgery | Admitting: Orthopedic Surgery

## 2012-11-21 DIAGNOSIS — Z01810 Encounter for preprocedural cardiovascular examination: Secondary | ICD-10-CM | POA: Diagnosis not present

## 2012-11-21 DIAGNOSIS — Z01812 Encounter for preprocedural laboratory examination: Secondary | ICD-10-CM | POA: Insufficient documentation

## 2012-11-21 DIAGNOSIS — Z01818 Encounter for other preprocedural examination: Secondary | ICD-10-CM | POA: Diagnosis not present

## 2012-11-21 DIAGNOSIS — R9431 Abnormal electrocardiogram [ECG] [EKG]: Secondary | ICD-10-CM | POA: Insufficient documentation

## 2012-11-21 HISTORY — DX: Unspecified osteoarthritis, unspecified site: M19.90

## 2012-11-21 HISTORY — DX: Gastro-esophageal reflux disease without esophagitis: K21.9

## 2012-11-21 HISTORY — DX: Malignant (primary) neoplasm, unspecified: C80.1

## 2012-11-21 HISTORY — DX: Anemia, unspecified: D64.9

## 2012-11-21 LAB — BASIC METABOLIC PANEL
BUN: 30 mg/dL — ABNORMAL HIGH (ref 6–23)
CO2: 27 mEq/L (ref 19–32)
Calcium: 9.4 mg/dL (ref 8.4–10.5)
Chloride: 100 mEq/L (ref 96–112)
Creatinine, Ser: 1.11 mg/dL — ABNORMAL HIGH (ref 0.50–1.10)
GFR calc Af Amer: 53 mL/min — ABNORMAL LOW (ref >90)
GFR calc non Af Amer: 46 mL/min — ABNORMAL LOW (ref >90)
Glucose, Bld: 96 mg/dL (ref 70–99)
Potassium: 5.5 mEq/L — ABNORMAL HIGH (ref 3.5–5.1)
Sodium: 134 mEq/L — ABNORMAL LOW (ref 135–145)

## 2012-11-21 LAB — CBC
HCT: 29.6 % — ABNORMAL LOW (ref 36.0–46.0)
Hemoglobin: 9.7 g/dL — ABNORMAL LOW (ref 12.0–15.0)
MCH: 28.5 pg (ref 26.0–34.0)
MCHC: 32.8 g/dL (ref 30.0–36.0)
MCV: 87.1 fL (ref 78.0–100.0)
Platelets: 282 10*3/uL (ref 150–400)
RBC: 3.4 MIL/uL — ABNORMAL LOW (ref 3.87–5.11)
RDW: 14 % (ref 11.5–15.5)
WBC: 8.4 10*3/uL (ref 4.0–10.5)

## 2012-11-21 NOTE — Pre-Procedure Instructions (Signed)
EKG AND CXR WERE DONE TODAY - PREOP - AT [facility]. OLD EKG ON CHART FROM DR. H. SMITH DONE 04/12/11.  CARDIOLOGY OFFICE NOTES DR. Mendel Ryder FROM 12/01/11 ON PT'S CHART. PT'S LAST OFFICE VISIT DR. BARTLE FOR FOLLOW UP OF ASCENDING AORTIC ANEURYSM WAS 12/14/11 - REPORT IN EPIC AND COPY PLACED ON PT'S CHART - "STABLE" PT'S PREOP HGB 9.7, POTASSIUM 5.5 AND BUN 30 - CBC, BMET REPORTS FAXED TO DR. ALUISIO'S OFFICE.

## 2012-11-21 NOTE — Patient Instructions (Signed)
YOUR SURGERY IS SCHEDULED AT [facility]  ON:  Friday 9/12  REPORT TO [facility] AT:  5:15 AM      PHONE # FOR SHORT STAY IS [phone]  DO NOT EAT OR DRINK ANYTHING AFTER MIDNIGHT THE NIGHT BEFORE YOUR SURGERY.  YOU MAY BRUSH YOUR TEETH, RINSE OUT YOUR MOUTH--BUT NO WATER, NO FOOD, NO CHEWING GUM, NO MINTS, NO CANDIES, NO CHEWING TOBACCO.  PLEASE TAKE THE FOLLOWING MEDICATIONS THE AM OF YOUR SURGERY WITH A FEW SIPS OF WATER:  CARVEDILOL, OMEPRAZOLE    DO NOT BRING VALUABLES, MONEY, CREDIT CARDS.  DO NOT WEAR JEWELRY, MAKE-UP, NAIL POLISH AND NO METAL PINS OR CLIPS IN YOUR HAIR. CONTACT LENS, DENTURES / PARTIALS, GLASSES SHOULD NOT BE WORN TO SURGERY AND IN MOST CASES-HEARING AIDS WILL NEED TO BE REMOVED.  BRING YOUR GLASSES CASE, ANY EQUIPMENT NEEDED FOR YOUR CONTACT LENS. FOR PATIENTS ADMITTED TO THE HOSPITAL--CHECK OUT TIME THE DAY OF DISCHARGE IS 11:00 AM.  ALL INPATIENT ROOMS ARE PRIVATE - WITH BATHROOM, TELEPHONE, TELEVISION AND WIFI INTERNET.                             PLEASE READ OVER ANY  FACT SHEETS THAT YOU WERE GIVEN:  INCENTIVE SPIROMETER INFORMATION. FAILURE TO FOLLOW THESE INSTRUCTIONS MAY RESULT IN THE CANCELLATION OF YOUR SURGERY.   PATIENT SIGNATURE_________________________________

## 2012-11-23 MED ORDER — ACETAMINOPHEN 10 MG/ML IV SOLN
1000.0000 mg | Freq: Once | INTRAVENOUS | Status: AC
  Administered 2012-11-24: 1000 mg via INTRAVENOUS
  Filled 2012-11-23: qty 100

## 2012-11-23 NOTE — H&P (Signed)
CC- [name] is a 79 y.o. female who presents with left hip pain  Hip Pain: Patient complains of left hip pain. Onset of the symptoms was several years ago. Inciting event: none. Current symptoms include is aggravated by walking. Associated symptoms: none, significant trochanteric pain and limp. Aggravating symptoms: rising after sitting, standing and walking. Patient's course of pain: increasing. Patient has had prior hip problems.She has a complex history with this hip including revision arthroplasty for instability.  Evaluation to date: MRI with probable gluteal tendon tear..  Treatment to date: physical therapy, which has been not very effective and injection which provided minimal relief.  Past Medical History  Diagnosis Date  . Pericarditis     RECURRENT   . Hypertension   . Thoracic aneurysm     "STABLE ANEURYSMAL DISEASE OF THE ASCENDING THORACIC AORTA WITH MAXIMAL DIAMETER OF 4.6" PER MRI CHEST 12/13/11 -AND LAST OFFICE NOTE DR. BARTLE IN [facility] 12/14/2011  . Heart murmur     "MILD AORTIC INSUFFICIENCY" PER CARDIOLOGY OFFICE NOTE DR. Mendel Ryder 12/01/11  . Anemia     CHRONIC  . Dysrhythmia     HX OF PAROXYSMAL SUPRAVENTRICULAR TACHYCARDIA ONCE - PT STATES NO PROBLEM WITH RE-OCCURANCE  . Chronic kidney disease     PT TOLD "MILD KIDNEY DISEASE FROM ALL THE DYE USED FOR XRAYS"  . GERD (gastroesophageal reflux disease)   . Cancer     SKIN CANCERS REMOVED FROM LEGS  . Arthritis     OA-SOME BACK AND NECK PAIN,  GOES TO CHIROPRACTOR TWICE A MONTH;  HX OF JOINT REPLACEMENTS   . Anemia requiring transfusions     POST OPERATIVELY    Past Surgical History  Procedure Laterality Date  . Appendectomy    . Total knee arthroplasty      right  . Revision total hip arthroplasty      left  . Vaginal hysterectomy      with ovarian cyst removal  . Pelvic laparoscopy      ovarian cyst removal,   . Oophorectomy      has a partial of one ovary remaingin  . Right achilles tendon repair   5/13  . Total knee arthroplasty      left  . Joint replacement      LEFT TOTAL HIP REPLACEMENT AND REVISIONS X 2    Prior to Admission medications   Medication Sig Start Date End Date Taking? Authorizing Provider  Calcium Carbonate-Vit D-Min (CALCIUM 1200 PO) Take by mouth.    Historical Provider, MD  carvedilol (COREG) 12.5 MG tablet Take 6.25-12.5 mg by mouth 2 (two) times daily. Takes 1/2 tablet in am and 1 tablet at night    Historical Provider, MD  cholecalciferol (VITAMIN D) 1000 UNITS tablet Take 1,000 Units by mouth daily.    Historical Provider, MD  diltiazem (CARDIZEM CD) 240 MG 24 hr capsule Take 240 mg by mouth every evening.     Historical Provider, MD  estrogens, conjugated, (PREMARIN) 0.3 MG tablet Take 0.3 mg by mouth daily.     Historical Provider, MD  fish oil-omega-3 fatty acids 1000 MG capsule Take 2 g by mouth daily.    Historical Provider, MD  meloxicam (MOBIC) 7.5 MG tablet Take 7.5 mg by mouth daily.    Historical Provider, MD  omeprazole (PRILOSEC) 20 MG capsule Take 20 mg by mouth daily.    Historical Provider, MD  valsartan (DIOVAN) 160 MG tablet Take 160 mg by mouth daily with lunch.  Historical Provider, MD    Physical Examination: General appearance - alert, well appearing, and in no distress Mental status - alert, oriented to person, place, and time Chest - clear to auscultation, no wheezes, rales or rhonchi, symmetric air entry Heart - normal rate, regular rhythm, normal S1, S2, no murmurs, rubs, clicks or gallops Abdomen - soft, nontender, nondistended, no masses or organomegaly Neurological - alert, oriented, normal speech, no focal findings or movement disorder noted  A left hip exam was performed. SWELLING: none WARMTH: no warmth TENDERNESS: moderate and maximal at greater trochanter ROM: normal Abduction weakness otherwise normal strength GAIT: antalgic  ASSESSMENT:Intractable lateral hip pain  Plan Left hip bursectomy and gluteal tendon  tear. :Plan gluteal tendon repair. Discussed in detail with patient who elects to proceed  Gus Rankin. Johanan Skorupski, MD    11/23/2012, 8:47 PM

## 2012-11-23 NOTE — Anesthesia Preprocedure Evaluation (Addendum)
Anesthesia Evaluation  Patient identified by MRN, date of birth, ID band Patient awake    Reviewed: Allergy & Precautions, H&P , NPO status , Patient's Chart, lab work & pertinent test results, reviewed documented beta blocker date and time   Airway Mallampati: II TM Distance: >3 FB Neck ROM: full    Dental no notable dental hx. (+) Teeth Intact and Dental Advisory Given   Pulmonary neg pulmonary ROS,  breath sounds clear to auscultation  Pulmonary exam normal       Cardiovascular hypertension, Pt. on home beta blockers + Peripheral Vascular Disease + dysrhythmias Supra Ventricular Tachycardia Rhythm:regular Rate:Normal  Stable aneurysm of ascending thoracic aorta.  4.6 cm.  History paroxysmal supraventricular tachycardia.  No reoccurance.  History of pericarditis.   Neuro/Psych negative neurological ROS  negative psych ROS   GI/Hepatic negative GI ROS, Neg liver ROS, GERD-  Medicated and Controlled,  Endo/Other  negative endocrine ROS  Renal/GU negative Renal ROS  negative genitourinary   Musculoskeletal   Abdominal   Peds  Hematology negative hematology ROS (+) Blood dyscrasia, anemia , hgb 9.7   Anesthesia Other Findings   Reproductive/Obstetrics negative OB ROS                          Anesthesia Physical Anesthesia Plan  ASA: III  Anesthesia Plan: Spinal   Post-op Pain Management:    Induction:   Airway Management Planned: Simple Face Mask  Additional Equipment:   Intra-op Plan:   Post-operative Plan:   Informed Consent: I have reviewed the patients History and Physical, chart, labs and discussed the procedure including the risks, benefits and alternatives for the proposed anesthesia with the patient or authorized representative who has indicated his/her understanding and acceptance.   Dental Advisory Given  Plan Discussed with: CRNA and Surgeon  Anesthesia Plan Comments:        Anesthesia Quick Evaluation

## 2012-11-24 ENCOUNTER — Encounter (HOSPITAL_COMMUNITY): Payer: Self-pay | Admitting: Anesthesiology

## 2012-11-24 ENCOUNTER — Encounter (HOSPITAL_COMMUNITY)
Admission: RE | Disposition: A | Discharge status: Home / Self Care | Payer: Self-pay | Source: Ambulatory Visit | Attending: Orthopedic Surgery

## 2012-11-24 ENCOUNTER — Encounter (HOSPITAL_COMMUNITY): Payer: Self-pay

## 2012-11-24 ENCOUNTER — Ambulatory Visit (HOSPITAL_COMMUNITY): Payer: Medicare Other | Admitting: Anesthesiology

## 2012-11-24 ENCOUNTER — Observation Stay (HOSPITAL_COMMUNITY)
Admission: RE | Admit: 2012-11-24 | Discharge: 2012-11-25 | Disposition: A | Discharge status: Home / Self Care | Payer: Medicare Other | Source: Ambulatory Visit | Attending: Orthopedic Surgery | Admitting: Orthopedic Surgery

## 2012-11-24 DIAGNOSIS — M76899 Other specified enthesopathies of unspecified lower limb, excluding foot: Principal | ICD-10-CM | POA: Insufficient documentation

## 2012-11-24 DIAGNOSIS — M2419 Other articular cartilage disorders, other specified site: Secondary | ICD-10-CM | POA: Insufficient documentation

## 2012-11-24 DIAGNOSIS — Z96659 Presence of unspecified artificial knee joint: Secondary | ICD-10-CM | POA: Diagnosis not present

## 2012-11-24 DIAGNOSIS — I712 Thoracic aortic aneurysm, without rupture, unspecified: Secondary | ICD-10-CM | POA: Insufficient documentation

## 2012-11-24 DIAGNOSIS — Z96649 Presence of unspecified artificial hip joint: Secondary | ICD-10-CM | POA: Diagnosis not present

## 2012-11-24 DIAGNOSIS — M479 Spondylosis, unspecified: Secondary | ICD-10-CM | POA: Insufficient documentation

## 2012-11-24 DIAGNOSIS — D649 Anemia, unspecified: Secondary | ICD-10-CM | POA: Diagnosis not present

## 2012-11-24 DIAGNOSIS — K219 Gastro-esophageal reflux disease without esophagitis: Secondary | ICD-10-CM | POA: Insufficient documentation

## 2012-11-24 DIAGNOSIS — M249 Joint derangement, unspecified: Secondary | ICD-10-CM | POA: Diagnosis not present

## 2012-11-24 DIAGNOSIS — M159 Polyosteoarthritis, unspecified: Secondary | ICD-10-CM | POA: Diagnosis not present

## 2012-11-24 DIAGNOSIS — I129 Hypertensive chronic kidney disease with stage 1 through stage 4 chronic kidney disease, or unspecified chronic kidney disease: Secondary | ICD-10-CM | POA: Diagnosis not present

## 2012-11-24 DIAGNOSIS — N189 Chronic kidney disease, unspecified: Secondary | ICD-10-CM | POA: Insufficient documentation

## 2012-11-24 DIAGNOSIS — I10 Essential (primary) hypertension: Secondary | ICD-10-CM | POA: Diagnosis not present

## 2012-11-24 DIAGNOSIS — IMO0002 Reserved for concepts with insufficient information to code with codable children: Secondary | ICD-10-CM | POA: Diagnosis not present

## 2012-11-24 DIAGNOSIS — M7062 Trochanteric bursitis, left hip: Secondary | ICD-10-CM | POA: Diagnosis present

## 2012-11-24 HISTORY — PX: EXCISION/RELEASE BURSA HIP: SHX5014

## 2012-11-24 LAB — BASIC METABOLIC PANEL
BUN: 33 mg/dL — ABNORMAL HIGH (ref 6–23)
CO2: 26 mEq/L (ref 19–32)
Calcium: 9 mg/dL (ref 8.4–10.5)
Chloride: 101 mEq/L (ref 96–112)
Creatinine, Ser: 1.29 mg/dL — ABNORMAL HIGH (ref 0.50–1.10)
GFR calc Af Amer: 44 mL/min — ABNORMAL LOW (ref >90)
GFR calc non Af Amer: 38 mL/min — ABNORMAL LOW (ref >90)
Glucose, Bld: 98 mg/dL (ref 70–99)
Potassium: 5.5 mEq/L — ABNORMAL HIGH (ref 3.5–5.1)
Sodium: 135 mEq/L (ref 135–145)

## 2012-11-24 SURGERY — RELEASE, BURSA, TROCHANTERIC
Anesthesia: Spinal | Site: Hip | Laterality: Left | Wound class: Clean

## 2012-11-24 MED ORDER — VANCOMYCIN HCL IN DEXTROSE 1-5 GM/200ML-% IV SOLN
1000.0000 mg | INTRAVENOUS | Status: AC
  Administered 2012-11-24: 1000 mg via INTRAVENOUS

## 2012-11-24 MED ORDER — BUPIVACAINE HCL (PF) 0.75 % IJ SOLN
INTRAMUSCULAR | Status: DC | PRN
  Administered 2012-11-24: 13 mg via INTRATHECAL

## 2012-11-24 MED ORDER — HYDROMORPHONE HCL PF 1 MG/ML IJ SOLN: 0.2500 mg | INTRAMUSCULAR | Status: DC | PRN

## 2012-11-24 MED ORDER — IRBESARTAN 150 MG PO TABS
150.0000 mg | ORAL_TABLET | Freq: Every day | ORAL | Status: DC
  Administered 2012-11-24 – 2012-11-25 (×2): 150 mg via ORAL
  Filled 2012-11-24 (×2): qty 1

## 2012-11-24 MED ORDER — LACTATED RINGERS IV SOLN
INTRAVENOUS | Status: DC | PRN
  Administered 2012-11-24: 07:00:00 via INTRAVENOUS

## 2012-11-24 MED ORDER — FENTANYL CITRATE 0.05 MG/ML IJ SOLN
INTRAMUSCULAR | Status: DC | PRN
  Administered 2012-11-24: 100 ug via INTRAVENOUS

## 2012-11-24 MED ORDER — DEXAMETHASONE SODIUM PHOSPHATE 10 MG/ML IJ SOLN: 10.0000 mg | Freq: Once | INTRAMUSCULAR | Status: DC

## 2012-11-24 MED ORDER — MIDAZOLAM HCL 5 MG/5ML IJ SOLN
INTRAMUSCULAR | Status: DC | PRN
  Administered 2012-11-24: 2 mg via INTRAVENOUS

## 2012-11-24 MED ORDER — VANCOMYCIN HCL IN DEXTROSE 1-5 GM/200ML-% IV SOLN
INTRAVENOUS | Status: AC
  Filled 2012-11-24: qty 200

## 2012-11-24 MED ORDER — 0.9 % SODIUM CHLORIDE (POUR BTL) OPTIME
TOPICAL | Status: DC | PRN
  Administered 2012-11-24: 1000 mL

## 2012-11-24 MED ORDER — BUPIVACAINE LIPOSOME 1.3 % IJ SUSP
20.0000 mL | Freq: Once | INTRAMUSCULAR | Status: DC
  Filled 2012-11-24: qty 20

## 2012-11-24 MED ORDER — SODIUM CHLORIDE 0.9 % IV SOLN: INTRAVENOUS | Status: DC

## 2012-11-24 MED ORDER — SODIUM CHLORIDE 0.9 % IJ SOLN
INTRAMUSCULAR | Status: AC
  Filled 2012-11-24: qty 50

## 2012-11-24 MED ORDER — ONDANSETRON HCL 4 MG/2ML IJ SOLN: 4.0000 mg | Freq: Four times a day (QID) | INTRAMUSCULAR | Status: DC | PRN

## 2012-11-24 MED ORDER — TRAMADOL HCL 50 MG PO TABS: 50.0000 mg | ORAL_TABLET | Freq: Four times a day (QID) | ORAL | Status: DC | PRN

## 2012-11-24 MED ORDER — METOCLOPRAMIDE HCL 10 MG PO TABS: 5.0000 mg | ORAL_TABLET | Freq: Three times a day (TID) | ORAL | Status: DC | PRN

## 2012-11-24 MED ORDER — METOCLOPRAMIDE HCL 5 MG/ML IJ SOLN: 5.0000 mg | Freq: Three times a day (TID) | INTRAMUSCULAR | Status: DC | PRN

## 2012-11-24 MED ORDER — PROPOFOL INFUSION 10 MG/ML OPTIME
INTRAVENOUS | Status: DC | PRN
  Administered 2012-11-24: 60 ug/kg/min via INTRAVENOUS

## 2012-11-24 MED ORDER — BUPIVACAINE HCL 0.25 % IJ SOLN
INTRAMUSCULAR | Status: DC | PRN
  Administered 2012-11-24: 20 mL

## 2012-11-24 MED ORDER — DEXAMETHASONE SODIUM PHOSPHATE 10 MG/ML IJ SOLN
INTRAMUSCULAR | Status: DC | PRN
  Administered 2012-11-24: 10 mg via INTRAVENOUS

## 2012-11-24 MED ORDER — LACTATED RINGERS IV SOLN: INTRAVENOUS | Status: DC

## 2012-11-24 MED ORDER — METHOCARBAMOL 100 MG/ML IJ SOLN
500.0000 mg | Freq: Four times a day (QID) | INTRAVENOUS | Status: DC | PRN
  Administered 2012-11-24: 500 mg via INTRAVENOUS
  Filled 2012-11-24 (×2): qty 5

## 2012-11-24 MED ORDER — ENOXAPARIN SODIUM 40 MG/0.4ML ~~LOC~~ SOLN
40.0000 mg | SUBCUTANEOUS | Status: DC
  Administered 2012-11-25: 40 mg via SUBCUTANEOUS
  Filled 2012-11-24 (×2): qty 0.4

## 2012-11-24 MED ORDER — CARVEDILOL 6.25 MG PO TABS
12.5000 mg | ORAL_TABLET | Freq: Every day | ORAL | Status: DC
  Administered 2012-11-24: 12.5 mg via ORAL
  Filled 2012-11-24: qty 2

## 2012-11-24 MED ORDER — HYDROMORPHONE HCL PF 1 MG/ML IJ SOLN: 0.5000 mg | INTRAMUSCULAR | Status: DC | PRN

## 2012-11-24 MED ORDER — PANTOPRAZOLE SODIUM 40 MG PO TBEC
40.0000 mg | DELAYED_RELEASE_TABLET | Freq: Every day | ORAL | Status: DC
  Administered 2012-11-24 – 2012-11-25 (×2): 40 mg via ORAL
  Filled 2012-11-24 (×2): qty 1

## 2012-11-24 MED ORDER — ACETAMINOPHEN 10 MG/ML IV SOLN
1000.0000 mg | Freq: Once | INTRAVENOUS | Status: DC
  Filled 2012-11-24: qty 100

## 2012-11-24 MED ORDER — TRAMADOL HCL 50 MG PO TABS
50.0000 mg | ORAL_TABLET | Freq: Four times a day (QID) | ORAL | Status: DC | PRN
  Administered 2012-11-24: 100 mg via ORAL
  Administered 2012-11-24: 50 mg via ORAL
  Administered 2012-11-25: 100 mg via ORAL
  Filled 2012-11-24 (×4): qty 2

## 2012-11-24 MED ORDER — HYDRALAZINE HCL 20 MG/ML IJ SOLN
10.0000 mg | Freq: Four times a day (QID) | INTRAMUSCULAR | Status: DC | PRN
  Administered 2012-11-24: 10 mg via INTRAVENOUS
  Filled 2012-11-24: qty 1

## 2012-11-24 MED ORDER — METHOCARBAMOL 500 MG PO TABS: 500.0000 mg | ORAL_TABLET | Freq: Four times a day (QID) | ORAL | Status: DC | PRN

## 2012-11-24 MED ORDER — VANCOMYCIN HCL IN DEXTROSE 1-5 GM/200ML-% IV SOLN
1000.0000 mg | Freq: Two times a day (BID) | INTRAVENOUS | Status: AC
  Administered 2012-11-24: 1000 mg via INTRAVENOUS
  Filled 2012-11-24: qty 200

## 2012-11-24 MED ORDER — BUPIVACAINE-EPINEPHRINE PF 0.25-1:200000 % IJ SOLN
INTRAMUSCULAR | Status: AC
  Filled 2012-11-24: qty 30

## 2012-11-24 MED ORDER — BUPIVACAINE HCL (PF) 0.25 % IJ SOLN
INTRAMUSCULAR | Status: AC
  Filled 2012-11-24: qty 30

## 2012-11-24 MED ORDER — SODIUM CHLORIDE 0.9 % IJ SOLN
INTRAMUSCULAR | Status: DC | PRN
  Administered 2012-11-24: 08:00:00

## 2012-11-24 MED ORDER — SODIUM CHLORIDE 0.9 % IV SOLN
INTRAVENOUS | Status: DC
  Administered 2012-11-24 – 2012-11-25 (×2): via INTRAVENOUS

## 2012-11-24 MED ORDER — ONDANSETRON HCL 4 MG/2ML IJ SOLN
INTRAMUSCULAR | Status: DC | PRN
  Administered 2012-11-24: 4 mg via INTRAVENOUS

## 2012-11-24 MED ORDER — OXYCODONE HCL 5 MG PO TABS
5.0000 mg | ORAL_TABLET | ORAL | Status: DC | PRN
  Administered 2012-11-24: 5 mg via ORAL
  Filled 2012-11-24 (×2): qty 1

## 2012-11-24 MED ORDER — ONDANSETRON HCL 4 MG PO TABS: 4.0000 mg | ORAL_TABLET | Freq: Four times a day (QID) | ORAL | Status: DC | PRN

## 2012-11-24 MED ORDER — CHLORHEXIDINE GLUCONATE 4 % EX LIQD
60.0000 mL | Freq: Once | CUTANEOUS | Status: DC
  Filled 2012-11-24: qty 60

## 2012-11-24 MED ORDER — DILTIAZEM HCL ER COATED BEADS 240 MG PO CP24
240.0000 mg | ORAL_CAPSULE | Freq: Every evening | ORAL | Status: DC
  Administered 2012-11-24: 240 mg via ORAL
  Filled 2012-11-24 (×2): qty 1

## 2012-11-24 MED ORDER — ASPIRIN EC 325 MG PO TBEC: 325.0000 mg | DELAYED_RELEASE_TABLET | Freq: Every day | ORAL | Status: DC

## 2012-11-24 SURGICAL SUPPLY — 45 items
BAG SPEC THK2 15X12 ZIP CLS (MISCELLANEOUS) ×1
BAG ZIPLOCK 12X15 (MISCELLANEOUS) ×2 IMPLANT
BIT DRILL 2.8 QUICK RELEASE (BIT) ×1 IMPLANT
BIT DRILL 2.8X128 (BIT) ×2 IMPLANT
BLADE EXTENDED COATED 6.5IN (ELECTRODE) IMPLANT
CLOTH BEACON ORANGE TIMEOUT ST (SAFETY) ×2 IMPLANT
DRAPE INCISE IOBAN 66X45 STRL (DRAPES) ×2 IMPLANT
DRAPE ORTHO SPLIT 77X108 STRL (DRAPES) ×4
DRAPE POUCH INSTRU U-SHP 10X18 (DRAPES) ×2 IMPLANT
DRAPE SURG ORHT 6 SPLT 77X108 (DRAPES) ×2 IMPLANT
DRAPE U-SHAPE 47X51 STRL (DRAPES) ×2 IMPLANT
DRILL 2.8 QUICK RELEASE (BIT) ×2
DRSG ADAPTIC 3X8 NADH LF (GAUZE/BANDAGES/DRESSINGS) ×2 IMPLANT
DRSG MEPILEX BORDER 4X4 (GAUZE/BANDAGES/DRESSINGS) ×1 IMPLANT
DRSG MEPILEX BORDER 4X8 (GAUZE/BANDAGES/DRESSINGS) ×1 IMPLANT
DURAPREP 26ML APPLICATOR (WOUND CARE) ×2 IMPLANT
ELECT REM PT RETURN 9FT ADLT (ELECTROSURGICAL) ×2
ELECTRODE REM PT RTRN 9FT ADLT (ELECTROSURGICAL) ×1 IMPLANT
EVACUATOR 1/8 PVC DRAIN (DRAIN) ×2 IMPLANT
GLOVE BIO SURGEON STRL SZ7.5 (GLOVE) ×2 IMPLANT
GLOVE BIO SURGEON STRL SZ8 (GLOVE) ×4 IMPLANT
GLOVE BIOGEL PI IND STRL 8 (GLOVE) ×3 IMPLANT
GLOVE BIOGEL PI INDICATOR 8 (GLOVE) ×3
GOWN PREVENTION PLUS LG XLONG (DISPOSABLE) ×2 IMPLANT
GOWN STRL REIN XL XLG (GOWN DISPOSABLE) ×2 IMPLANT
IV SET HUBERPLUS 22X1 SAFETY (NEEDLE) ×2 IMPLANT
KIT BASIN OR (CUSTOM PROCEDURE TRAY) ×2 IMPLANT
MANIFOLD NEPTUNE II (INSTRUMENTS) ×2 IMPLANT
NEEDLE MAYO .5 CIRCLE (NEEDLE) ×2 IMPLANT
NS IRRIG 1000ML POUR BTL (IV SOLUTION) ×2 IMPLANT
PACK TOTAL JOINT (CUSTOM PROCEDURE TRAY) ×2 IMPLANT
PASSER SUT SWANSON 36MM LOOP (INSTRUMENTS) ×2 IMPLANT
POSITIONER SURGICAL ARM (MISCELLANEOUS) ×2 IMPLANT
SPONGE GAUZE 4X4 12PLY (GAUZE/BANDAGES/DRESSINGS) ×2 IMPLANT
STAPLER VISISTAT 35W (STAPLE) ×2 IMPLANT
STRIP CLOSURE SKIN 1/2X4 (GAUZE/BANDAGES/DRESSINGS) ×4 IMPLANT
SUT ETHIBOND NAB CT1 #1 30IN (SUTURE) ×4 IMPLANT
SUT MNCRL AB 4-0 PS2 18 (SUTURE) ×2 IMPLANT
SUT VIC AB 1 CT1 27 (SUTURE) ×6
SUT VIC AB 1 CT1 27XBRD ANTBC (SUTURE) ×3 IMPLANT
SUT VIC AB 2-0 CT1 27 (SUTURE) ×6
SUT VIC AB 2-0 CT1 TAPERPNT 27 (SUTURE) ×3 IMPLANT
SYR 30ML LL (SYRINGE) ×2 IMPLANT
TOWEL OR 17X26 10 PK STRL BLUE (TOWEL DISPOSABLE) ×4 IMPLANT
WATER STERILE IRR 1500ML POUR (IV SOLUTION) ×2 IMPLANT

## 2012-11-24 NOTE — Anesthesia Procedure Notes (Signed)
Spinal  Patient location during procedure: OR Start time: 11/24/2012 7:23 AM End time: 11/24/2012 7:30 AM Staffing Anesthesiologist: Ronelle Nigh L Performed by: anesthesiologist  Preanesthetic Checklist Completed: patient identified, site marked, surgical consent, pre-op evaluation, timeout performed, IV checked, risks and benefits discussed and monitors and equipment checked Spinal Block Patient position: sitting Prep: Betadine Patient monitoring: heart rate, continuous pulse ox and blood pressure Approach: right paramedian Location: L3-4 Injection technique: single-shot Needle Needle type: Spinocan  Needle gauge: 22 G Needle length: 9 cm Assessment Sensory level: T6 Additional Notes Expiration date of kit checked and confirmed. Patient tolerated procedure well, without complications.

## 2012-11-24 NOTE — Interval H&P Note (Signed)
History and Physical Interval Note:  11/24/2012 7:14 AM  [name]  has presented today for surgery, with the diagnosis of LEFT HIP GLUTEAL TENDON TEAR   The various methods of treatment have been discussed with the patient and family. After consideration of risks, benefits and other options for treatment, the patient has consented to  Procedure(s): LEFT HIP BURSECTOMY AND TENDON REPAIR  (Left) as a surgical intervention .  The patient's history has been reviewed, patient examined, no change in status, stable for surgery.  I have reviewed the patient's chart and labs.  Questions were answered to the patient's satisfaction.     Loanne Drilling

## 2012-11-24 NOTE — Progress Notes (Signed)
2200 VS revealed pt with increased BP.  She is very concerned about this elevation.  Initially when BP taken it was 169/80 and pt mentioned concern to nurse tech.  Tech informed this Charity fundraiser.  When RN rechecked BP within 15-20 minutes it was 183/89 in R arm and 172/93 in L arm.  Pt said pain at this time is minimal, but she is concerned re: her aneurysm.  MD on call paged and PRN orders received.  Will administer Hydralazine and monitor.

## 2012-11-24 NOTE — Brief Op Note (Signed)
11/24/2012  8:11 AM  PATIENT:  [name]  79 y.o. female  PRE-OPERATIVE DIAGNOSIS:  LEFT HIP GLUTEAL TENDON TEAR   POST-OPERATIVE DIAGNOSIS:  LEFT HIP GLUTEAL TENDON TEAR  PROCEDURE:  Procedure(s): LEFT HIP BURSECTOMY AND TENDON REPAIR  (Left)  SURGEON:  Surgeon(s) and Role:    * Loanne Drilling, MD - Primary  PHYSICIAN ASSISTANT:   ASSISTANTS: Avel Peace, PA-C   ANESTHESIA:   spinal  EBL:   minimal  DRAINS: (Medium) Hemovact drain(s) in the left hip with  Suction Open   LOCAL MEDICATIONS USED:  OTHER Exparel  COUNTS:  YES  TOURNIQUET:  * No tourniquets in log *  DICTATION: .Other Dictation: Dictation Number [id number]  PLAN OF CARE: Admit for overnight observation  PATIENT DISPOSITION:  PACU - hemodynamically stable.

## 2012-11-24 NOTE — Preoperative (Signed)
Beta Blockers   Reason not to administer Beta Blockers: took at 0445 11/24/2012

## 2012-11-24 NOTE — Progress Notes (Signed)
As pt and tech were getting up to ambulate, pt hemovac pulled loose from site at L leg/hip. Per RN from floor, only need to document occurrence.  No bleeding noted at dressing/site.

## 2012-11-24 NOTE — Anesthesia Postprocedure Evaluation (Signed)
  Anesthesia Post-op Note  Patient: [name]  Procedure(s) Performed: Procedure(s) (LRB): LEFT HIP BURSECTOMY AND TENDON REPAIR  (Left)  Patient Location: PACU  Anesthesia Type: Spinal  Level of Consciousness: awake and alert   Airway and Oxygen Therapy: Patient Spontanous Breathing  Post-op Pain: mild  Post-op Assessment: Post-op Vital signs reviewed, Patient's Cardiovascular Status Stable, Respiratory Function Stable, Patent Airway and No signs of Nausea or vomiting  Last Vitals:  Filed Vitals:   11/24/12 1410  BP: 135/74  Pulse: 65  Temp: 36.6 C  Resp: 16    Post-op Vital Signs: stable   Complications: No apparent anesthesia complications

## 2012-11-24 NOTE — Transfer of Care (Signed)
Immediate Anesthesia Transfer of Care Note  Patient: [name]  Procedure(s) Performed: Procedure(s) (LRB): LEFT HIP BURSECTOMY AND TENDON REPAIR  (Left)  Patient Location: PACU  Anesthesia Type: Spinal  Level of Consciousness: sedated, patient cooperative and responds to stimulaton  Airway & Oxygen Therapy: Patient Spontanous Breathing and Patient connected to face mask oxgen  Post-op Assessment: Report given to PACU RN and Post -op Vital signs reviewed and stable  Post vital signs: Reviewed and stable  Complications: No apparent anesthesia complications

## 2012-11-24 NOTE — Op Note (Signed)
[name], [name]  MEDICAL RECORD NO.:  [id number]  LOCATION:  1609                         FACILITY:  [facility]  PHYSICIAN:  Ollen Gross, M.D.    DATE OF BIRTH:  [birth year]  DATE OF PROCEDURE:  11/24/2012 DATE OF DISCHARGE:                              OPERATIVE REPORT   PREOPERATIVE DIAGNOSES:  Left hip intractable bursitis and gluteal tendon tear.  POSTOPERATIVE DIAGNOSES:  Left hip intractable bursitis and gluteal tendon tear.  PROCEDURE:  Left hip bursectomy and gluteal tendon repair.  SURGEON:  Ollen Gross, M.D.  ASSISTANT:  Alexzandrew L. Perkins, P.A.C.  ANESTHESIA:  Spinal.  ESTIMATED BLOOD LOSS:  Minimal.  DRAINS:  Hemovac x1.  COMPLICATIONS:  None.  CONDITION:  Stable to recovery.  BRIEF CLINICAL NOTE:  [name] is an 79 year old female, long complex history in regards to her left hip.  She has had a total hip arthroplasty with revision for instability and for component failure. She has had progressively worsening lateral hip pain and limp.  MRI showed probable gluteal tendon tear as well as a small amount of fluid in the joint.  She presents now for bursectomy and tendon repair.  DESCRIPTION OF PROCEDURE:  After successful administration of spinal anesthetic, the patient was placed in the right lateral decubitus position with the left side up and held with the hip positioner.  The left lower extremity isolated from her perineum with plastic drapes and prepped and draped in the usual sterile fashion.  Her previous short posterolateral incisions were utilized.  Skin cut with a 10 blade through the subcutaneous tissue to the level of the fascia lata.  This is very thin layer just right over the greater trochanter.  I incised that it was a very large hypertrophic bursal sac.  We incised the rest of the fascia lata along the length of the incision and then identified the bursa and removed it.  From the bursa was a  small amount of joint fluid which did not appear to have any suspicious aspect to it.  There were absolutely no signs of synovitis or infection.  The joint was inspected.  There is a very small amount of polyethylene wear, but no evidence of any component failure.  The joint was thoroughly irrigated. Note that the posterior structures were not attached to the greater trochanter.  As they had either come off or atrophied.  The gluteus medius, anterior two-third was attached and posterior third was not.  In order to bolster any repair, might decided to utilize a part of the gluteus max tendon and adhere it to the gluteus medius to get the posterior repair.  We used Ethibond suture to sew these together and then through drill holes  passed that through the greater trochanter and tied it to the trochanter then overtied the a the gluteus medius tendon to envelope the trochanter.  This led to very stable repair of both tendons.  We then closed the remainder of the fascia lata with the Ethibond.  Note that Hemovac drain had been placed into the joint prior to this.  After the fascia was repaired, tested the drain and it was moving freely.  We  then injected a total of 20 mL of Exparel mixed with 30 mL of saline into the subcu tissues, gluteal tendons, and fascia lata.  An additional 20 mL of 0.25% bupivacaine was also injected into the same tissues.  The subcutaneous was closed in 2 layers with #1 Vicryl and 2-0 Vicryl and subcuticular closed with running 4-0 Monocryl. The incision was cleaned and dried, and Steri-Strips and a bulky sterile dressing applied.  The drain was hooked to suction and she was awakened and transported to recovery in stable condition.     Ollen Gross, M.D.     FA/MEDQ  D:  11/24/2012  T:  11/24/2012  Job:  [id number]

## 2012-11-25 NOTE — Discharge Summary (Signed)
Physician Discharge Summary   Patient ID: [name] MRN: [id number] DOB/AGE: [birth year] 79 y.o.  Admit date: 11/24/2012 Discharge date: 11/25/2012  Admission Diagnoses:  Principal Problem:   Trochanteric bursitis of left hip   Discharge Diagnoses:  Same   Surgeries: Procedure(s): LEFT HIP BURSECTOMY AND TENDON REPAIR  on 11/24/2012   Consultants: PT  Discharged Condition: Stable  Hospital Course: ENEDELIA Wall is an 79 y.o. female who was admitted 11/24/2012 with a chief complaint of No chief complaint on file. , and found to have a diagnosis of Trochanteric bursitis of left hip.  They were brought to the operating room on 11/24/2012 and underwent the above named procedures.    The patient had an uncomplicated hospital course and was stable for discharge.  Recent vital signs:  Filed Vitals:   11/25/12 1013  BP: 151/73  Pulse: 65  Temp: 97.7 F (36.5 C)  Resp: 16    Recent laboratory studies:  Results for orders placed during the hospital encounter of 11/24/12  BASIC METABOLIC PANEL      Result Value Range   Sodium 135  135 - 145 mEq/L   Potassium 5.5 (*) 3.5 - 5.1 mEq/L   Chloride 101  96 - 112 mEq/L   CO2 26  19 - 32 mEq/L   Glucose, Bld 98  70 - 99 mg/dL   BUN 33 (*) 6 - 23 mg/dL   Creatinine, Ser 3.08 (*) 0.50 - 1.10 mg/dL   Calcium 9.0  8.4 - 65.7 mg/dL   GFR calc non Af Amer 38 (*) >90 mL/min   GFR calc Af Amer 44 (*) >90 mL/min    Discharge Medications:     Medication List         aspirin EC 325 MG tablet  Take 1 tablet (325 mg total) by mouth daily.     CALCIUM 1200 PO  Take by mouth.     carvedilol 12.5 MG tablet  Commonly known as:  COREG  Take 6.25-12.5 mg by mouth 2 (two) times daily. Takes 1/2 tablet in am and 1 tablet at night     cholecalciferol 1000 UNITS tablet  Commonly known as:  VITAMIN D  Take 1,000 Units by mouth daily.     diltiazem 240 MG 24 hr capsule  Commonly known as:  CARDIZEM CD  Take 240 mg by mouth  every evening.     estrogens (conjugated) 0.3 MG tablet  Commonly known as:  PREMARIN  Take 0.3 mg by mouth daily.     fish oil-omega-3 fatty acids 1000 MG capsule  Take 2 g by mouth daily.     meloxicam 7.5 MG tablet  Commonly known as:  MOBIC  Take 7.5 mg by mouth daily.     methocarbamol 500 MG tablet  Commonly known as:  ROBAXIN  Take 1 tablet (500 mg total) by mouth every 6 (six) hours as needed.     omeprazole 20 MG capsule  Commonly known as:  PRILOSEC  Take 20 mg by mouth daily.     traMADol 50 MG tablet  Commonly known as:  ULTRAM  Take 1-2 tablets (50-100 mg total) by mouth every 6 (six) hours as needed.     valsartan 160 MG tablet  Commonly known as:  DIOVAN  Take 160 mg by mouth daily with lunch.        Diagnostic Studies: Dg Chest 2 View  11/21/2012   *RADIOLOGY REPORT*  Clinical Data: Preoperative evaluation for hip surgery  CHEST - 2 VIEW  Comparison: 03/20/2011  Findings: The cardiac shadow is stable.  The lungs are clear bilaterally.  No acute bony abnormality is seen.  IMPRESSION: No acute abnormality noted.   Original Report Authenticated By: Alcide Clever, M.D.    Disposition: 01-Home or Self Care       Future Appointments Provider Department Dept Phone   12/21/2012 12:00 PM Dara Lords, MD [facility] [phone]      Follow-up Information   Follow up with Loanne Drilling, MD. Schedule an appointment as soon as possible for a visit in 2 weeks. (Call [phone] Monday to make the appointment)    Specialty:  Orthopedic Surgery   Contact information:   [address] [place] [address] [phone]        Signed: Verlee Rossetti 11/25/2012, 11:26 AM

## 2012-11-25 NOTE — Progress Notes (Signed)
Discharged from floor via w/c, family with pt. No changes in assessment. [name]  

## 2012-11-25 NOTE — Progress Notes (Signed)
Orthopedics Progress Note  Subjective: I feel great  Objective:  Filed Vitals:   11/25/12 1013  BP: 151/73  Pulse: 65  Temp: 97.7 F (36.5 C)  Resp: 16    General: Awake and alert  Musculoskeletal: Left hip wound CDI, no erythema, Mepilex dressing in place Neurovascularly intact  Lab Results  Component Value Date   WBC 8.4 11/21/2012   HGB 9.7* 11/21/2012   HCT 29.6* 11/21/2012   MCV 87.1 11/21/2012   PLT 282 11/21/2012       Component Value Date/Time   NA 135 11/24/2012 0605   K 5.5* 11/24/2012 0605   CL 101 11/24/2012 0605   CO2 26 11/24/2012 0605   GLUCOSE 98 11/24/2012 0605   BUN 33* 11/24/2012 0605   CREATININE 1.29* 11/24/2012 0605   CALCIUM 9.0 11/24/2012 0605   GFRNONAA 38* 11/24/2012 0605   GFRAA 44* 11/24/2012 0605    Lab Results  Component Value Date   INR 1.55* 09/06/2010   INR 1.76* 08/28/2010   INR 1.80* 08/27/2010    Assessment/Plan: POD #2 s/p Procedure(s): LEFT HIP BURSECTOMY AND TENDON REPAIR  Stable overnight, ok for D/C ASA for 2 weeks Ambulation with a walker for balance  Viviann Spare R. Ranell Patrick, MD 11/25/2012 11:21 AM

## 2012-11-27 ENCOUNTER — Encounter (HOSPITAL_COMMUNITY): Payer: Self-pay | Admitting: Orthopedic Surgery

## 2012-12-05 DIAGNOSIS — M999 Biomechanical lesion, unspecified: Secondary | ICD-10-CM | POA: Diagnosis not present

## 2012-12-05 DIAGNOSIS — M5030 Other cervical disc degeneration, unspecified cervical region: Secondary | ICD-10-CM | POA: Diagnosis not present

## 2012-12-05 DIAGNOSIS — M9981 Other biomechanical lesions of cervical region: Secondary | ICD-10-CM | POA: Diagnosis not present

## 2012-12-05 DIAGNOSIS — M5137 Other intervertebral disc degeneration, lumbosacral region: Secondary | ICD-10-CM | POA: Diagnosis not present

## 2012-12-13 DIAGNOSIS — Z9889 Other specified postprocedural states: Secondary | ICD-10-CM | POA: Diagnosis not present

## 2012-12-13 DIAGNOSIS — Z4789 Encounter for other orthopedic aftercare: Secondary | ICD-10-CM | POA: Diagnosis not present

## 2012-12-18 ENCOUNTER — Encounter: Payer: Self-pay | Admitting: Interventional Cardiology

## 2012-12-18 ENCOUNTER — Ambulatory Visit (INDEPENDENT_AMBULATORY_CARE_PROVIDER_SITE_OTHER): Payer: Medicare Other | Admitting: Interventional Cardiology

## 2012-12-18 VITALS — BP 130/70 | HR 70 | Ht 64.3 in | Wt 163.0 lb

## 2012-12-18 DIAGNOSIS — IMO0001 Reserved for inherently not codable concepts without codable children: Secondary | ICD-10-CM

## 2012-12-18 DIAGNOSIS — I10 Essential (primary) hypertension: Secondary | ICD-10-CM

## 2012-12-18 DIAGNOSIS — I359 Nonrheumatic aortic valve disorder, unspecified: Secondary | ICD-10-CM | POA: Diagnosis not present

## 2012-12-18 DIAGNOSIS — I712 Thoracic aortic aneurysm, without rupture: Secondary | ICD-10-CM | POA: Diagnosis not present

## 2012-12-18 DIAGNOSIS — I351 Nonrheumatic aortic (valve) insufficiency: Secondary | ICD-10-CM | POA: Insufficient documentation

## 2012-12-18 NOTE — Progress Notes (Signed)
Patient ID: [name], female   DOB: 03-May-1929, 79 y.o.   MRN: [id number]    HPI  [name] has no complaints, and denies medication side effects. She has not had syncope, or palpitations. No transient neurological complaints have been noted. She denies palpitations, edema, and orthopnea  Allergies  Allergen Reactions  . Clindamycin/Lincomycin Rash  . Cephalexin     Stroke symptoms  . Ciprofloxacin     RASH  . Shellfish Allergy Hives  . Sulfa Antibiotics     Unknown   . Sulfites     Current Outpatient Prescriptions  Medication Sig Dispense Refill  . aspirin EC 325 MG tablet Take 1 tablet (325 mg total) by mouth daily.  30 tablet  0  . Calcium Carbonate-Vit D-Min (CALCIUM 1200 PO) Take by mouth.      . carvedilol (COREG) 12.5 MG tablet Take 6.25-12.5 mg by mouth 2 (two) times daily. Takes 1/2 tablet in am and 1 tablet at night      . cholecalciferol (VITAMIN D) 1000 UNITS tablet Take 1,000 Units by mouth daily.      Marland Kitchen diltiazem (CARDIZEM CD) 240 MG 24 hr capsule Take 240 mg by mouth every evening.       . estrogens, conjugated, (PREMARIN) 0.3 MG tablet Take 0.3 mg by mouth daily.       . fish oil-omega-3 fatty acids 1000 MG capsule Take 2 g by mouth daily.      . meloxicam (MOBIC) 7.5 MG tablet Take 7.5 mg by mouth as needed.       Marland Kitchen omeprazole (PRILOSEC) 20 MG capsule Take 20 mg by mouth daily.      . valsartan (DIOVAN) 160 MG tablet Take 160 mg by mouth daily with lunch.       No current facility-administered medications for this visit.    Past Medical History  Diagnosis Date  . Pericarditis     RECURRENT   . Hypertension   . Thoracic aneurysm     "STABLE ANEURYSMAL DISEASE OF THE ASCENDING THORACIC AORTA WITH MAXIMAL DIAMETER OF 4.6" PER MRI CHEST 12/13/11 -AND LAST OFFICE NOTE DR. BARTLE IN [facility] 12/14/2011  . Heart murmur     "MILD AORTIC INSUFFICIENCY" PER CARDIOLOGY OFFICE NOTE DR. Mendel Ryder 12/01/11  . Anemia     CHRONIC  . Dysrhythmia     HX OF PAROXYSMAL  SUPRAVENTRICULAR TACHYCARDIA ONCE - PT STATES NO PROBLEM WITH RE-OCCURANCE  . Chronic kidney disease     PT TOLD "MILD KIDNEY DISEASE FROM ALL THE DYE USED FOR XRAYS"  . GERD (gastroesophageal reflux disease)   . Cancer     SKIN CANCERS REMOVED FROM LEGS  . Arthritis     OA-SOME BACK AND NECK PAIN,  GOES TO CHIROPRACTOR TWICE A MONTH;  HX OF JOINT REPLACEMENTS   . Anemia requiring transfusions     POST OPERATIVELY    Past Surgical History  Procedure Laterality Date  . Appendectomy    . Total knee arthroplasty      right  . Revision total hip arthroplasty      left  . Vaginal hysterectomy      with ovarian cyst removal  . Pelvic laparoscopy      ovarian cyst removal,   . Oophorectomy      has a partial of one ovary remaingin  . Right achilles tendon repair  5/13  . Total knee arthroplasty      left  . Joint replacement  LEFT TOTAL HIP REPLACEMENT AND REVISIONS X 2  . Excision/release bursa hip Left 11/24/2012    Procedure: LEFT HIP BURSECTOMY AND TENDON REPAIR ;  Surgeon: Loanne Drilling, MD;  Location: [facility];  Service: Orthopedics;  Laterality: Left;    ROS: Negative other than as mentioned above PHYSICAL EXAM BP 130/70  Pulse 70  Ht 5' 4.3" (1.633 m)  Wt 163 lb (73.936 kg)  BMI 27.73 kg/m2  SpO2 97% Skin is clear. No JVD is noted. Chest is clear to auscultation and percussion Cardiac exam reveals an S4 gallop. Soft 1/6 decrescendo murmur of aortic regurgitation is heard. Abdomen is soft. Normal bowel sounds are heard. Extremities demonstrate that there is decreased mobility of the right hip and left shoulder Neurological exam is unremarkable  EKG: Not performed  ASSESSMENT AND PLAN  1. Hypertension is well controlled.  2. Aortic regurgitation is clinically mild and causing. No particular complaints.  3. Thoracic aortic root enlargement without symptoms  The patient is doing well att this time. No specific evaluation is needed. She will keep track of  her blood pressures. She will call if problems. Otherwise, see her in one year.

## 2012-12-18 NOTE — Patient Instructions (Addendum)
Call if problems.  Your physician recommends that you continue on your current medications as directed. Please refer to the Current Medication list given to you today.  Your physician recommends that you schedule a follow-up appointment in: 1 year with Dr.Sundeep Destin

## 2012-12-19 ENCOUNTER — Emergency Department (HOSPITAL_COMMUNITY): Payer: Medicare Other

## 2012-12-19 ENCOUNTER — Emergency Department (HOSPITAL_COMMUNITY)
Admission: EM | Admit: 2012-12-19 | Discharge: 2012-12-19 | Disposition: A | Discharge status: Home / Self Care | Payer: Medicare Other | Attending: Emergency Medicine | Admitting: Emergency Medicine

## 2012-12-19 ENCOUNTER — Encounter (HOSPITAL_COMMUNITY)
Admission: EM | Disposition: A | Discharge status: Home / Self Care | Payer: Self-pay | Source: Home / Self Care | Attending: Emergency Medicine

## 2012-12-19 ENCOUNTER — Encounter (HOSPITAL_COMMUNITY): Payer: Self-pay | Admitting: Anesthesiology

## 2012-12-19 ENCOUNTER — Inpatient Hospital Stay: Admit: 2012-12-19 | Payer: Self-pay | Admitting: Orthopedic Surgery

## 2012-12-19 ENCOUNTER — Encounter (HOSPITAL_COMMUNITY): Payer: Self-pay | Admitting: Emergency Medicine

## 2012-12-19 ENCOUNTER — Emergency Department (HOSPITAL_COMMUNITY): Payer: Medicare Other | Admitting: Anesthesiology

## 2012-12-19 DIAGNOSIS — R52 Pain, unspecified: Secondary | ICD-10-CM | POA: Diagnosis not present

## 2012-12-19 DIAGNOSIS — T84029A Dislocation of unspecified internal joint prosthesis, initial encounter: Secondary | ICD-10-CM | POA: Diagnosis not present

## 2012-12-19 DIAGNOSIS — X500XXA Overexertion from strenuous movement or load, initial encounter: Secondary | ICD-10-CM | POA: Insufficient documentation

## 2012-12-19 DIAGNOSIS — S73005A Unspecified dislocation of left hip, initial encounter: Secondary | ICD-10-CM

## 2012-12-19 DIAGNOSIS — Z96649 Presence of unspecified artificial hip joint: Secondary | ICD-10-CM | POA: Diagnosis not present

## 2012-12-19 DIAGNOSIS — Z79899 Other long term (current) drug therapy: Secondary | ICD-10-CM | POA: Diagnosis not present

## 2012-12-19 DIAGNOSIS — T871X9 Complications of reattached (part of) unspecified lower extremity: Secondary | ICD-10-CM | POA: Diagnosis not present

## 2012-12-19 DIAGNOSIS — Y998 Other external cause status: Secondary | ICD-10-CM | POA: Insufficient documentation

## 2012-12-19 DIAGNOSIS — I10 Essential (primary) hypertension: Secondary | ICD-10-CM | POA: Diagnosis not present

## 2012-12-19 DIAGNOSIS — M25559 Pain in unspecified hip: Secondary | ICD-10-CM | POA: Diagnosis not present

## 2012-12-19 DIAGNOSIS — Z4789 Encounter for other orthopedic aftercare: Secondary | ICD-10-CM | POA: Diagnosis not present

## 2012-12-19 DIAGNOSIS — Y92009 Unspecified place in unspecified non-institutional (private) residence as the place of occurrence of the external cause: Secondary | ICD-10-CM | POA: Insufficient documentation

## 2012-12-19 HISTORY — PX: HIP CLOSED REDUCTION: SHX983

## 2012-12-19 LAB — POCT I-STAT, CHEM 8
BUN: 27 mg/dL — ABNORMAL HIGH (ref 6–23)
Calcium, Ion: 1.16 mmol/L (ref 1.13–1.30)
Chloride: 105 mEq/L (ref 96–112)
Creatinine, Ser: 1.2 mg/dL — ABNORMAL HIGH (ref 0.50–1.10)
Glucose, Bld: 93 mg/dL (ref 70–99)
HCT: 27 % — ABNORMAL LOW (ref 36.0–46.0)
Hemoglobin: 9.2 g/dL — ABNORMAL LOW (ref 12.0–15.0)
Potassium: 4.2 mEq/L (ref 3.5–5.1)
Sodium: 135 mEq/L (ref 135–145)
TCO2: 19 mmol/L (ref 0–100)

## 2012-12-19 SURGERY — CLOSED MANIPULATION, JOINT, HIP
Anesthesia: General | Site: Hip | Laterality: Left | Wound class: Clean

## 2012-12-19 SURGERY — CLOSED REDUCTION, HIP
Anesthesia: General | Site: Hip | Laterality: Left | Wound class: Clean

## 2012-12-19 MED ORDER — ONDANSETRON HCL 4 MG/2ML IJ SOLN
4.0000 mg | Freq: Four times a day (QID) | INTRAMUSCULAR | Status: DC | PRN
  Administered 2012-12-19: 4 mg via INTRAVENOUS
  Filled 2012-12-19: qty 2

## 2012-12-19 MED ORDER — SUCCINYLCHOLINE CHLORIDE 20 MG/ML IJ SOLN
INTRAMUSCULAR | Status: DC | PRN
  Administered 2012-12-19: 100 mg via INTRAVENOUS

## 2012-12-19 MED ORDER — PROPOFOL 10 MG/ML IV BOLUS
INTRAVENOUS | Status: DC | PRN
  Administered 2012-12-19: 160 mg via INTRAVENOUS

## 2012-12-19 MED ORDER — ONDANSETRON HCL 4 MG/2ML IJ SOLN
INTRAMUSCULAR | Status: DC | PRN
  Administered 2012-12-19: 4 mg via INTRAMUSCULAR

## 2012-12-19 MED ORDER — LIDOCAINE HCL (CARDIAC) 20 MG/ML IV SOLN
INTRAVENOUS | Status: DC | PRN
  Administered 2012-12-19: 50 mg via INTRAVENOUS

## 2012-12-19 MED ORDER — HYDROMORPHONE HCL PF 1 MG/ML IJ SOLN
1.0000 mg | Freq: Once | INTRAMUSCULAR | Status: AC
  Administered 2012-12-19: 1 mg via INTRAVENOUS
  Filled 2012-12-19: qty 1

## 2012-12-19 MED ORDER — DEXAMETHASONE SODIUM PHOSPHATE 10 MG/ML IJ SOLN
INTRAMUSCULAR | Status: DC | PRN
  Administered 2012-12-19: 10 mg via INTRAVENOUS

## 2012-12-19 MED ORDER — HYDROMORPHONE HCL PF 1 MG/ML IJ SOLN
0.5000 mg | INTRAMUSCULAR | Status: DC | PRN
  Administered 2012-12-19: 1 mg via INTRAVENOUS
  Filled 2012-12-19: qty 1

## 2012-12-19 MED ORDER — FENTANYL CITRATE 0.05 MG/ML IJ SOLN: 25.0000 ug | INTRAMUSCULAR | Status: DC | PRN

## 2012-12-19 MED ORDER — METOCLOPRAMIDE HCL 5 MG/ML IJ SOLN
INTRAMUSCULAR | Status: DC | PRN
  Administered 2012-12-19: 10 mg via INTRAVENOUS

## 2012-12-19 MED ORDER — TRAMADOL HCL 50 MG PO TABS: 50.0000 mg | ORAL_TABLET | Freq: Four times a day (QID) | ORAL | Status: DC | PRN

## 2012-12-19 MED ORDER — LACTATED RINGERS IV SOLN: INTRAVENOUS | Status: DC

## 2012-12-19 MED ORDER — LACTATED RINGERS IV SOLN
INTRAVENOUS | Status: DC | PRN
  Administered 2012-12-19: 20:00:00 via INTRAVENOUS

## 2012-12-19 MED ORDER — ONDANSETRON HCL 4 MG/2ML IJ SOLN
4.0000 mg | Freq: Once | INTRAMUSCULAR | Status: AC
  Administered 2012-12-19: 4 mg via INTRAVENOUS
  Filled 2012-12-19: qty 2

## 2012-12-19 MED ORDER — DIAZEPAM 5 MG/ML IJ SOLN
2.5000 mg | Freq: Once | INTRAMUSCULAR | Status: AC
  Administered 2012-12-19: 2.5 mg via INTRAVENOUS
  Filled 2012-12-19: qty 2

## 2012-12-19 SURGICAL SUPPLY — 21 items
BANDAGE ADHESIVE 1X3 (GAUZE/BANDAGES/DRESSINGS) IMPLANT
CLOTH BEACON ORANGE TIMEOUT ST (SAFETY) ×2 IMPLANT
DURAPREP 26ML APPLICATOR (WOUND CARE) ×2 IMPLANT
GLOVE BIOGEL PI IND STRL 7.5 (GLOVE) ×1 IMPLANT
GLOVE BIOGEL PI IND STRL 8 (GLOVE) ×1 IMPLANT
GLOVE BIOGEL PI INDICATOR 7.5 (GLOVE) ×1
GLOVE BIOGEL PI INDICATOR 8 (GLOVE) ×1
GLOVE ECLIPSE 8.0 STRL XLNG CF (GLOVE) IMPLANT
GLOVE ORTHO TXT STRL SZ7.5 (GLOVE) ×4 IMPLANT
GLOVE SURG ORTHO 8.0 STRL STRW (GLOVE) ×2 IMPLANT
GOWN BRE IMP PREV XXLGXLNG (GOWN DISPOSABLE) ×4 IMPLANT
GOWN PREVENTION PLUS LG XLONG (DISPOSABLE) ×2 IMPLANT
IMMOBILIZER KNEE 20 (SOFTGOODS) ×2
IMMOBILIZER KNEE 20 THIGH 36 (SOFTGOODS) IMPLANT
MANIFOLD NEPTUNE II (INSTRUMENTS) ×2 IMPLANT
NDL SAFETY ECLIPSE 18X1.5 (NEEDLE) IMPLANT
NEEDLE HYPO 18GX1.5 SHARP (NEEDLE)
POSITIONER SURGICAL ARM (MISCELLANEOUS) ×2 IMPLANT
SPONGE GAUZE 4X4 12PLY (GAUZE/BANDAGES/DRESSINGS) IMPLANT
SYR CONTROL 10ML LL (SYRINGE) IMPLANT
TOWEL OR 17X26 10 PK STRL BLUE (TOWEL DISPOSABLE) ×4 IMPLANT

## 2012-12-19 NOTE — H&P (View-Only) (Signed)
Reason for Consult:Left hip dislocation Referring Physician: EDP  HPI: [name] is an 79 y.o. female long term patient of Dr. Aluisio, recent left hip abductor repair, was putting on shoe with foot up on toilet earlier today when left hip dislocated. This is first dislocation since revision by Dr. Aluisio approximately 2 years ago. Was seen in office last week and noted to have seroma/hematoma which was aspirated and empirically placed on doxycycline. Fluid cx reportedly negative for infection.  Past Medical History  Diagnosis Date  . Pericarditis     RECURRENT   . Hypertension   . Thoracic aneurysm     "STABLE ANEURYSMAL DISEASE OF THE ASCENDING THORACIC AORTA WITH MAXIMAL DIAMETER OF 4.6" PER MRI CHEST 12/13/11 -AND LAST OFFICE NOTE DR. BARTLE IN [facility] 12/14/2011  . Heart murmur     "MILD AORTIC INSUFFICIENCY" PER CARDIOLOGY OFFICE NOTE DR. H. SMITH 12/01/11  . Anemia     CHRONIC  . Dysrhythmia     HX OF PAROXYSMAL SUPRAVENTRICULAR TACHYCARDIA ONCE - PT STATES NO PROBLEM WITH RE-OCCURANCE  . Chronic kidney disease     PT TOLD "MILD KIDNEY DISEASE FROM ALL THE DYE USED FOR XRAYS"  . GERD (gastroesophageal reflux disease)   . Cancer     SKIN CANCERS REMOVED FROM LEGS  . Arthritis     OA-SOME BACK AND NECK PAIN,  GOES TO CHIROPRACTOR TWICE A MONTH;  HX OF JOINT REPLACEMENTS   . Anemia requiring transfusions     POST OPERATIVELY    Past Surgical History  Procedure Laterality Date  . Appendectomy    . Total knee arthroplasty      right  . Revision total hip arthroplasty      left  . Vaginal hysterectomy      with ovarian cyst removal  . Pelvic laparoscopy      ovarian cyst removal,   . Oophorectomy      has a partial of one ovary remaingin  . Right achilles tendon repair  5/13  . Total knee arthroplasty      left  . Joint replacement      LEFT TOTAL HIP REPLACEMENT AND REVISIONS X 2  . Excision/release bursa hip Left 11/24/2012    Procedure: LEFT HIP BURSECTOMY AND  TENDON REPAIR ;  Surgeon: Frank V Aluisio, MD;  Location: [facility];  Service: Orthopedics;  Laterality: Left;    Family History  Problem Relation Age of Onset  . Hypertension Mother   . Stroke Mother   . Heart attack Father 53    Social History:  reports that she has quit smoking. Her smoking use included Cigarettes. She has a 15 pack-year smoking history. She has never used smokeless tobacco. She reports that she does not drink alcohol or use illicit drugs.  Allergies:  Allergies  Allergen Reactions  . Clindamycin/Lincomycin Rash  . Cephalexin     Stroke symptoms  . Ciprofloxacin     RASH  . Shellfish Allergy Hives  . Sulfa Antibiotics     Unknown   . Sulfites     Medications:   No results found for this or any previous visit (from the past 48 hour(s)).  Dg Hip Portable 1 View Left  12/19/2012   CLINICAL DATA:  Followup head left hip pain. History of a left hip arthroplasty.  EXAM: PORTABLE LEFT HIP - 1 VIEW  COMPARISON:  12/05/2009  FINDINGS: The prosthesis has dislocated with the prosthetic femoral head lying superior to the acetabular component. There   is no fracture. There is no evidence of loosening of the acetabular or femoral prosthetic components.  IMPRESSION: Dislocated left hip prosthesis. No fracture.   Electronically Signed   By: David  Ormond M.D.   On: 12/19/2012 13:19     Vitals Temp:  [98.1 F (36.7 C)] 98.1 F (36.7 C) (10/07 1155) Pulse Rate:  [69] 69 (10/07 1155) Resp:  [18] 18 (10/07 1155) BP: (187)/(86) 187/86 mmHg (10/07 1155) SpO2:  [96 %-99 %] 99 % (10/07 1155) There is no weight on file to calculate BMI.  Physical Exam: left hip diffusely swollen, erythema about distal aspect of recent incision with sub q fluctuance c/w hematoma/seroma. Sl increased temp to palpation, minimally tender to palpation. Severe hip pain with attempts at motion. N/V intact distally.     Assessment/Plan: Impression: Left dislocated THA, post op seroma Treatment:  Discussed with patient and husband treatment options and they agree with plan for closed reduction. Dr. Olin is available to perform reduction and will proceed when OR time available   Vaniah Chambers M 12/19/2012, 3:58 PM        

## 2012-12-19 NOTE — Anesthesia Preprocedure Evaluation (Addendum)
Anesthesia Evaluation  Patient identified by MRN, date of birth, ID band Patient awake    Reviewed: Allergy & Precautions, H&P , NPO status , Patient's Chart, lab work & pertinent test results  Airway Mallampati: II TM Distance: >3 FB Neck ROM: Full    Dental no notable dental hx.    Pulmonary neg pulmonary ROS,  breath sounds clear to auscultation  Pulmonary exam normal       Cardiovascular Exercise Tolerance: Good hypertension, Pt. on medications and Pt. on home beta blockers + Peripheral Vascular Disease + dysrhythmias + Valvular Problems/Murmurs AI Rhythm:Regular Rate:Normal  CXR and ECG reviewed. Went to cardiologist yesterday and everything was fine.   Neuro/Psych negative neurological ROS  negative psych ROS   GI/Hepatic Neg liver ROS, GERD-  Medicated,  Endo/Other  negative endocrine ROS  Renal/GU Renal InsufficiencyRenal disease  negative genitourinary   Musculoskeletal negative musculoskeletal ROS (+)   Abdominal   Peds negative pediatric ROS (+)  Hematology negative hematology ROS (+)   Anesthesia Other Findings   Reproductive/Obstetrics negative OB ROS                          Anesthesia Physical Anesthesia Plan  ASA: III  Anesthesia Plan: General   Post-op Pain Management:    Induction: Intravenous  Airway Management Planned: Oral ETT  Additional Equipment:   Intra-op Plan:   Post-operative Plan: Extubation in OR  Informed Consent: I have reviewed the patients History and Physical, chart, labs and discussed the procedure including the risks, benefits and alternatives for the proposed anesthesia with the patient or authorized representative who has indicated his/her understanding and acceptance.   Dental advisory given  Plan Discussed with: CRNA  Anesthesia Plan Comments: (NPO after 0800. Injury at 0900. )       Anesthesia Quick Evaluation

## 2012-12-19 NOTE — ED Provider Notes (Signed)
CSN: [id number]     Arrival date & time 12/19/12  1154 History   First MD Initiated Contact with Patient 12/19/12 1201     Chief Complaint  Patient presents with  . Hip Pain   (Consider location/radiation/quality/duration/timing/severity/associated sxs/prior Treatment) HPI Comments: 79 year old female with a history of left hip replacement and recent left bursectomy and gluteal tendon repair who presents with acute onset left hip pain. Severe in intensity. Spasming pain. She states she was standing on her right leg with her left foot propped on a toilet. As she pulled her left foot off the toilet she felt a pop and then eased herself down onto the toilet. She did not fall.  Patient is a 79 y.o. female presenting with hip pain.  Hip Pain This is a new problem. The current episode started 1 to 2 hours ago. The problem occurs constantly. The problem has not changed since onset.Pertinent negatives include no chest pain, no abdominal pain and no shortness of breath. Exacerbated by: Movement. Relieved by: Fentanyl.    Past Medical History  Diagnosis Date  . Pericarditis     RECURRENT   . Hypertension   . Thoracic aneurysm     "STABLE ANEURYSMAL DISEASE OF THE ASCENDING THORACIC AORTA WITH MAXIMAL DIAMETER OF 4.6" PER MRI CHEST 12/13/11 -AND LAST OFFICE NOTE DR. BARTLE IN [facility] 12/14/2011  . Heart murmur     "MILD AORTIC INSUFFICIENCY" PER CARDIOLOGY OFFICE NOTE DR. Mendel Ryder 12/01/11  . Anemia     CHRONIC  . Dysrhythmia     HX OF PAROXYSMAL SUPRAVENTRICULAR TACHYCARDIA ONCE - PT STATES NO PROBLEM WITH RE-OCCURANCE  . Chronic kidney disease     PT TOLD "MILD KIDNEY DISEASE FROM ALL THE DYE USED FOR XRAYS"  . GERD (gastroesophageal reflux disease)   . Cancer     SKIN CANCERS REMOVED FROM LEGS  . Arthritis     OA-SOME BACK AND NECK PAIN,  GOES TO CHIROPRACTOR TWICE A MONTH;  HX OF JOINT REPLACEMENTS   . Anemia requiring transfusions     POST OPERATIVELY   Past Surgical History  Procedure  Laterality Date  . Appendectomy    . Total knee arthroplasty      right  . Revision total hip arthroplasty      left  . Vaginal hysterectomy      with ovarian cyst removal  . Pelvic laparoscopy      ovarian cyst removal,   . Oophorectomy      has a partial of one ovary remaingin  . Right achilles tendon repair  5/13  . Total knee arthroplasty      left  . Joint replacement      LEFT TOTAL HIP REPLACEMENT AND REVISIONS X 2  . Excision/release bursa hip Left 11/24/2012    Procedure: LEFT HIP BURSECTOMY AND TENDON REPAIR ;  Surgeon: Loanne Drilling, MD;  Location: [facility];  Service: Orthopedics;  Laterality: Left;   Family History  Problem Relation Age of Onset  . Hypertension Mother   . Stroke Mother   . Heart attack Father 71   History  Substance Use Topics  . Smoking status: Former Smoker -- 1.00 packs/day for 15 years    Types: Cigarettes  . Smokeless tobacco: Never Used     Comment: quit age 27  . Alcohol Use: No     Comment: QUIT SMOKING 1982   OB History   Grav Para Term Preterm Abortions TAB SAB Ect Mult Living   2  2        2     Review of Systems  Constitutional: Negative for fever.  HENT: Negative for congestion.   Respiratory: Negative for cough and shortness of breath.   Cardiovascular: Negative for chest pain.  Gastrointestinal: Negative for nausea, vomiting, abdominal pain and diarrhea.  All other systems reviewed and are negative.    Allergies  Clindamycin/lincomycin; Cephalexin; Ciprofloxacin; Shellfish allergy; Sulfa antibiotics; and Sulfites  Home Medications   Current Outpatient Rx  Name  Route  Sig  Dispense  Refill  . Ascorbic Acid (VITAMIN C PO)   Oral   Take 1 tablet by mouth daily.         Marland Kitchen aspirin 325 MG EC tablet   Oral   Take 325 mg by mouth daily.         . Calcium Carbonate-Vit D-Min (CALCIUM 1200 PO)   Oral   Take 1 tablet by mouth daily.          . carboxymethylcellulose (REFRESH TEARS) 0.5 % SOLN   Both Eyes    Place 1 drop into both eyes 2 (two) times daily as needed (dry eyes).         . carvedilol (COREG) 12.5 MG tablet   Oral   Take 6.25-12.5 mg by mouth 2 (two) times daily. Takes 1/2 tablet in am and 1 tablet at night         . cholecalciferol (VITAMIN D) 1000 UNITS tablet   Oral   Take 1,000 Units by mouth daily.         Marland Kitchen diltiazem (CARDIZEM CD) 240 MG 24 hr capsule   Oral   Take 240 mg by mouth every evening.          . estrogens, conjugated, (PREMARIN) 0.3 MG tablet   Oral   Take 0.3 mg by mouth daily.          . fish oil-omega-3 fatty acids 1000 MG capsule   Oral   Take 2 g by mouth daily.         . meloxicam (MOBIC) 7.5 MG tablet   Oral   Take 7.5 mg by mouth daily as needed for pain.          Marland Kitchen omeprazole (PRILOSEC) 20 MG capsule   Oral   Take 20 mg by mouth daily.         . valsartan (DIOVAN) 160 MG tablet   Oral   Take 160 mg by mouth daily as needed (pt takes if BP > 140).          . traMADol (ULTRAM) 50 MG tablet   Oral   Take 50 mg by mouth every 6 (six) hours as needed for pain.           BP 187/86  Pulse 69  Temp(Src) 98.1 F (36.7 C) (Oral)  Resp 18  SpO2 99% Physical Exam  Nursing note and vitals reviewed. Constitutional: She is oriented to person, place, and time. She appears well-developed and well-nourished. She appears distressed (appears in pain).  HENT:  Head: Normocephalic and atraumatic.  Mouth/Throat: Oropharynx is clear and moist.  Eyes: Conjunctivae are normal. Pupils are equal, round, and reactive to light. No scleral icterus.  Neck: Neck supple.  Cardiovascular: Normal rate, regular rhythm, normal heart sounds and intact distal pulses.   No murmur heard. Pulmonary/Chest: Effort normal and breath sounds normal. No stridor. No respiratory distress. She has no rales.  Abdominal: Soft. Bowel sounds are normal. She  exhibits no distension. There is no tenderness.  Musculoskeletal:       Left hip: She exhibits decreased  range of motion, tenderness and deformity (shortened and internally rotated 2+ distal pulses, normal strength and sensation distally).  Neurological: She is alert and oriented to person, place, and time.  Skin: Skin is warm and dry. No rash noted.  Psychiatric: She has a normal mood and affect. Her behavior is normal.    ED Course  Procedures (including critical care time) Labs Review Labs Reviewed  POCT I-STAT, CHEM 8 - Abnormal; Notable for the following:    BUN 27 (*)    Creatinine, Ser 1.20 (*)    Hemoglobin 9.2 (*)    HCT 27.0 (*)    All other components within normal limits   Imaging Review Dg Hip Portable 1 View Left  12/19/2012   CLINICAL DATA:  Followup head left hip pain. History of a left hip arthroplasty.  EXAM: PORTABLE LEFT HIP - 1 VIEW  COMPARISON:  12/05/2009  FINDINGS: The prosthesis has dislocated with the prosthetic femoral head lying superior to the acetabular component. There is no fracture. There is no evidence of loosening of the acetabular or femoral prosthetic components.  IMPRESSION: Dislocated left hip prosthesis. No fracture.   Electronically Signed   By: Amie Portland M.D.   On: 12/19/2012 13:19  All radiology studies independently viewed by me.     EKG - NSR, rate 64, normal axis, normal intervals, no ST/T changes, compared to prior, no significant changes.   MDM   1. Hip dislocation, left, initial encounter    79 yo female with hx of left hip replacement with apparent left hip dislocation.  No other injuries.  Given Fentanyl PTA.  Dilaudid ordered.  LLE is NV intact distally.    13:19 PM Left Hip appears dislocated on xray.  Have placed consult to orthopedics.    3:19 PM I spoke with Dr. Rennis Chris who is going to arrange for patient to be taken to OR for reduction.  4:16 PM labs consistent with prior  Candyce Churn, MD 12/19/12 1616

## 2012-12-19 NOTE — Anesthesia Postprocedure Evaluation (Signed)
  Anesthesia Post-op Note  Patient: [name]  Procedure(s) Performed: Procedure(s) (LRB): CLOSED MANIPULATION HIP (Left)  Patient Location: PACU  Anesthesia Type: General  Level of Consciousness: awake and alert   Airway and Oxygen Therapy: Patient Spontanous Breathing  Post-op Pain: mild  Post-op Assessment: Post-op Vital signs reviewed, Patient's Cardiovascular Status Stable, Respiratory Function Stable, Patent Airway and No signs of Nausea or vomiting  Last Vitals:  Filed Vitals:   12/19/12 2100  BP:   Pulse: 70  Temp:   Resp: 11    Post-op Vital Signs: stable   Complications: No apparent anesthesia complications

## 2012-12-19 NOTE — ED Notes (Signed)
Bed: WA07 Expected date:  Expected time:  Means of arrival:  Comments: Hip pain 

## 2012-12-19 NOTE — Consult Note (Signed)
Reason for Consult:Left hip dislocation Referring Physician: EDP  HPI: [name] is an 79 y.o. female long term patient of Dr. Lequita Halt, recent left hip abductor repair, was putting on shoe with foot up on toilet earlier today when left hip dislocated. This is first dislocation since revision by Dr. Lequita Halt approximately 2 years ago. Was seen in office last week and noted to have seroma/hematoma which was aspirated and empirically placed on doxycycline. Fluid cx reportedly negative for infection.  Past Medical History  Diagnosis Date  . Pericarditis     RECURRENT   . Hypertension   . Thoracic aneurysm     "STABLE ANEURYSMAL DISEASE OF THE ASCENDING THORACIC AORTA WITH MAXIMAL DIAMETER OF 4.6" PER MRI CHEST 12/13/11 -AND LAST OFFICE NOTE DR. BARTLE IN [facility] 12/14/2011  . Heart murmur     "MILD AORTIC INSUFFICIENCY" PER CARDIOLOGY OFFICE NOTE DR. Mendel Ryder 12/01/11  . Anemia     CHRONIC  . Dysrhythmia     HX OF PAROXYSMAL SUPRAVENTRICULAR TACHYCARDIA ONCE - PT STATES NO PROBLEM WITH RE-OCCURANCE  . Chronic kidney disease     PT TOLD "MILD KIDNEY DISEASE FROM ALL THE DYE USED FOR XRAYS"  . GERD (gastroesophageal reflux disease)   . Cancer     SKIN CANCERS REMOVED FROM LEGS  . Arthritis     OA-SOME BACK AND NECK PAIN,  GOES TO CHIROPRACTOR TWICE A MONTH;  HX OF JOINT REPLACEMENTS   . Anemia requiring transfusions     POST OPERATIVELY    Past Surgical History  Procedure Laterality Date  . Appendectomy    . Total knee arthroplasty      right  . Revision total hip arthroplasty      left  . Vaginal hysterectomy      with ovarian cyst removal  . Pelvic laparoscopy      ovarian cyst removal,   . Oophorectomy      has a partial of one ovary remaingin  . Right achilles tendon repair  5/13  . Total knee arthroplasty      left  . Joint replacement      LEFT TOTAL HIP REPLACEMENT AND REVISIONS X 2  . Excision/release bursa hip Left 11/24/2012    Procedure: LEFT HIP BURSECTOMY AND  TENDON REPAIR ;  Surgeon: Loanne Drilling, MD;  Location: [facility];  Service: Orthopedics;  Laterality: Left;    Family History  Problem Relation Age of Onset  . Hypertension Mother   . Stroke Mother   . Heart attack Father 35    Social History:  reports that she has quit smoking. Her smoking use included Cigarettes. She has a 15 pack-year smoking history. She has never used smokeless tobacco. She reports that she does not drink alcohol or use illicit drugs.  Allergies:  Allergies  Allergen Reactions  . Clindamycin/Lincomycin Rash  . Cephalexin     Stroke symptoms  . Ciprofloxacin     RASH  . Shellfish Allergy Hives  . Sulfa Antibiotics     Unknown   . Sulfites     Medications:   No results found for this or any previous visit (from the past 48 hour(s)).  Dg Hip Portable 1 View Left  12/19/2012   CLINICAL DATA:  Followup head left hip pain. History of a left hip arthroplasty.  EXAM: PORTABLE LEFT HIP - 1 VIEW  COMPARISON:  12/05/2009  FINDINGS: The prosthesis has dislocated with the prosthetic femoral head lying superior to the acetabular component. There  is no fracture. There is no evidence of loosening of the acetabular or femoral prosthetic components.  IMPRESSION: Dislocated left hip prosthesis. No fracture.   Electronically Signed   By: Amie Portland M.D.   On: 12/19/2012 13:19     Vitals Temp:  [98.1 F (36.7 C)] 98.1 F (36.7 C) (10/07 1155) Pulse Rate:  [69] 69 (10/07 1155) Resp:  [18] 18 (10/07 1155) BP: (187)/(86) 187/86 mmHg (10/07 1155) SpO2:  [96 %-99 %] 99 % (10/07 1155) There is no weight on file to calculate BMI.  Physical Exam: left hip diffusely swollen, erythema about distal aspect of recent incision with sub q fluctuance c/w hematoma/seroma. Sl increased temp to palpation, minimally tender to palpation. Severe hip pain with attempts at motion. N/V intact distally.     Assessment/Plan: Impression: Left dislocated THA, post op seroma Treatment:  Discussed with patient and husband treatment options and they agree with plan for closed reduction. Dr. Charlann Boxer is available to perform reduction and will proceed when OR time available   Yerachmiel Spinney M 12/19/2012, 3:58 PM

## 2012-12-19 NOTE — ED Notes (Signed)
PER EMS- pt picked up from home with c/o l hip pain.  Pt has hx of hip replacement and l hip surgery x3 weeks.  Pt reports she was putting foot on toilet and then felt something pop.  Denies fall.  PTA given fentanyl.  Denies fall.

## 2012-12-19 NOTE — Interval H&P Note (Signed)
History and Physical Interval Note:  12/19/2012 7:39 PM  [name]  has presented today for surgery, with the diagnosis of dislocated left hip  The various methods of treatment have been discussed with the patient and family. After consideration of risks, benefits and other options for treatment, the patient has consented to  Procedure(s): CLOSED MANIPULATION HIP (Left) as a surgical intervention .  The patient's history has been reviewed, patient examined, no change in status, stable for surgery.  I have reviewed the patient's chart and labs.  Questions were answered to the patient's satisfaction.     Shelda Pal

## 2012-12-19 NOTE — Transfer of Care (Signed)
Immediate Anesthesia Transfer of Care Note  Patient: [name]  Procedure(s) Performed: Procedure(s): CLOSED MANIPULATION HIP (Left)  Patient Location: PACU  Anesthesia Type:General  Level of Consciousness: awake, alert  and oriented  Airway & Oxygen Therapy: Patient Spontanous Breathing and Patient connected to face mask oxygen  Post-op Assessment: Report given to PACU RN and Post -op Vital signs reviewed and stable  Post vital signs: Reviewed and stable  Complications: No apparent anesthesia complications

## 2012-12-19 NOTE — ED Notes (Signed)
Notified Ortho that pt needs admit orders.

## 2012-12-19 NOTE — Brief Op Note (Signed)
12/19/2012  8:12 PM  PATIENT:  [name]  79 y.o. female  PRE-OPERATIVE DIAGNOSIS:  dislocated left total hip replacement  POST-OPERATIVE DIAGNOSIS:  dislocated left total hip replacement  PROCEDURE:  Procedure(s): CLOSED MANIPULATION HIP (Left), closed reduction left total hip replacement  SURGEON:  Surgeon(s) and Role:    * Shelda Pal, MD - Primary  PHYSICIAN ASSISTANT: Babish, PA-C  ASSISTANTS: Surgical team  ANESTHESIA:   general  EBL:     BLOOD ADMINISTERED:none  DRAINS: none   LOCAL MEDICATIONS USED:  NONE  SPECIMEN:  No Specimen  DISPOSITION OF SPECIMEN:  N/A  COUNTS:  YES  TOURNIQUET:  * No tourniquets in log *  DICTATION: .Other Dictation: Dictation Number [id number]  PLAN OF CARE: Discharge to home after PACU  PATIENT DISPOSITION:  PACU - hemodynamically stable.   Delay start of Pharmacological VTE agent (>24hrs) due to surgical blood loss or risk of bleeding: not applicable

## 2012-12-20 ENCOUNTER — Encounter (HOSPITAL_COMMUNITY): Payer: Self-pay | Admitting: Orthopedic Surgery

## 2012-12-20 NOTE — Op Note (Signed)
NAMEMARIELLEN, BLANEY NO.:  [id number]  MEDICAL RECORD NO.:  [id number]  LOCATION:  WOTF                         FACILITY:  [facility]  PHYSICIAN:  Madlyn Frankel. Charlann Boxer, M.D.  DATE OF BIRTH:  [birth year]  DATE OF PROCEDURE:  12/19/2012 DATE OF DISCHARGE:  12/19/2012                              OPERATIVE REPORT   PREOPERATIVE DIAGNOSIS:  Dislocation of the left total hip arthroplasty.  POSTOPERATIVE DIAGNOSIS:  Dislocation of the left total hip arthroplasty.  PROCEDURE:  Closed reduction of a left total hip arthroplasty under general anesthetic.  SURGEON:  Madlyn Frankel. Charlann Boxer, MD.  ASSISTANT:  Lanney Gins, PA-C.  Mr. Carmon Sails was present and utilized for direct pressure on the pelvis to allow for appropriate traction and internal rotation.  ANESTHESIA:  General.  COMPLICATION:  None apparent.  INDICATIONS FOR PROCEDURE:  Ms. Macaluso is an 79 year old female with a history of revision left total hip arthroplasty in 2011.  She recently about 3 weeks ago underwent surgical repair of gluteus medius tendon. She unfortunately had a misadventure earlier today when she bent forward and dislocated her hip.  She was brought to the emergency room where radiographs revealed a posterior dislocation of the left hip.  She was initially seen by Dr. Francena Hanly who performed initial consult note. I have volunteered to help as I was in the operating room to help reduce her hip.  I had a chance to meet with her in the preoperative holding area and discussed the intentions and necessity of the procedure as well as her concerns regarding her recent gluteal tendon repair.  Consent was obtained for benefit of pain relief as well as reducing the hip.  PROCEDURE IN DETAIL:  The patient was brought to the operative theater. Once adequate preoperative anesthesia was administered, a time-out was performed identifying the patient, planned procedure, and extremity. Once the anesthetic was  applied, pelvic pressure was applied downward. The hip was then flexed, internally rotated, and an axial traction applied.  The hip audibly and palpably reduced.  The leg lengths were restored.  The rotation of the hip was restored.  She was then extubated and brought to the recovery room in stable condition tolerating the procedure well.  Please note that the reduction did take a moderate amount of traction to apply with internal rotation.  I was careful that all motions were done in a more controlled fashion to prevent any potential issues with the gluteus medius tendon that may or may not have been injured during the time of her trauma.  She is going to be placed in a knee immobilizer.  She will be following her hip precautions.  She will follow up with Dr. Lequita Halt scheduled in 3 weeks.     Madlyn Frankel Charlann Boxer, M.D.     MDO/MEDQ  D:  12/19/2012  T:  12/20/2012  Job:  [id number]

## 2012-12-21 ENCOUNTER — Encounter: Payer: Self-pay | Admitting: Gynecology

## 2012-12-21 NOTE — Addendum Note (Signed)
Encounter addended by: Candyce Churn, MD on: 12/21/2012  7:00 AM<BR>     Documentation filed: ED Disposition

## 2012-12-25 DIAGNOSIS — M999 Biomechanical lesion, unspecified: Secondary | ICD-10-CM | POA: Diagnosis not present

## 2012-12-25 DIAGNOSIS — M9981 Other biomechanical lesions of cervical region: Secondary | ICD-10-CM | POA: Diagnosis not present

## 2012-12-25 DIAGNOSIS — M5030 Other cervical disc degeneration, unspecified cervical region: Secondary | ICD-10-CM | POA: Diagnosis not present

## 2012-12-25 DIAGNOSIS — M5137 Other intervertebral disc degeneration, lumbosacral region: Secondary | ICD-10-CM | POA: Diagnosis not present

## 2013-01-03 DIAGNOSIS — M5137 Other intervertebral disc degeneration, lumbosacral region: Secondary | ICD-10-CM | POA: Diagnosis not present

## 2013-01-03 DIAGNOSIS — M999 Biomechanical lesion, unspecified: Secondary | ICD-10-CM | POA: Diagnosis not present

## 2013-01-03 DIAGNOSIS — M5030 Other cervical disc degeneration, unspecified cervical region: Secondary | ICD-10-CM | POA: Diagnosis not present

## 2013-01-03 DIAGNOSIS — M9981 Other biomechanical lesions of cervical region: Secondary | ICD-10-CM | POA: Diagnosis not present

## 2013-01-17 DIAGNOSIS — M999 Biomechanical lesion, unspecified: Secondary | ICD-10-CM | POA: Diagnosis not present

## 2013-01-17 DIAGNOSIS — M9981 Other biomechanical lesions of cervical region: Secondary | ICD-10-CM | POA: Diagnosis not present

## 2013-01-23 ENCOUNTER — Ambulatory Visit (INDEPENDENT_AMBULATORY_CARE_PROVIDER_SITE_OTHER): Payer: Medicare Other | Admitting: Gynecology

## 2013-01-23 ENCOUNTER — Encounter: Payer: Self-pay | Admitting: Gynecology

## 2013-01-23 VITALS — BP 124/78 | Ht 64.0 in | Wt 160.0 lb

## 2013-01-23 DIAGNOSIS — Z79890 Hormone replacement therapy (postmenopausal): Secondary | ICD-10-CM

## 2013-01-23 DIAGNOSIS — N952 Postmenopausal atrophic vaginitis: Secondary | ICD-10-CM

## 2013-01-23 MED ORDER — ESTROGENS CONJUGATED 0.3 MG PO TABS: 0.3000 mg | ORAL_TABLET | Freq: Every day | ORAL | Status: DC

## 2013-01-23 NOTE — Progress Notes (Signed)
MCKINLEY ADELSTEIN [birth year] [id number]        79 y.o.  G2P2 for followup exam.  Several issues noted below.  Past medical history,surgical history, problem list, medications, allergies, family history and social history were all reviewed and documented in the EPIC chart.  ROS:  Performed and pertinent positives and negatives are included in the history, assessment and plan .  Exam: Kim assistant Filed Vitals:   01/23/13 1450  BP: 124/78  Height: 5\' 4"  (1.626 m)  Weight: 160 lb (72.576 kg)   General appearance  Normal Skin grossly normal Head/Neck normal with no cervical or supraclavicular adenopathy thyroid normal Lungs  clear Cardiac RR, without RMG Abdominal  soft, nontender, without masses, organomegaly or hernia Breasts  examined lying and sitting without masses, retractions, discharge or axillary adenopathy. Pelvic  Ext/BUS/vagina  with atrophic changes   Adnexa  Without masses or tenderness    Anus and perineum  normal   Rectovaginal  normal sphincter tone without palpated masses or tenderness.    Assessment/Plan:  79 y.o. G2P2 female for annual exam.   1. Postmenopausal/atrophic genital changes/ERT. Patient is not having any significant symptoms of vaginal dryness hot flashes or night sweats. Patient continues on Premarin 0.3 mg daily. She's been on this since her 2s. I again reviewed the risks/benefits. WHI study with increased risk of stroke heart attack DVT possible breast cancer risk reviewed as well as ACOG/NAMS statements for the lowest dose for shortest period of time. I reviewed with her that it's extremely unusual at age 42 for someone to be on ERT. Patient feels very strongly that she wants to continue. She understands the risks and accepts stating she feels good on the medication and does not want to discontinue it. I refilled her Premarin 0.3 mg daily x1 year. 2. Pap smear. Last Pap smear 2011. No Pap smear done today. No history of significant abnormal Pap  smears before. She is status post hysterectomy and over the age of 59 and we have agreed on stop screening. 3. Mammography 07/2012. Continue with annual mammography SBE monthly reviewed. 4. Bone density. DEXA 05/2009 was normal. We'll plan repeat into her 80s. 5. Colonoscopy. She has stopped receiving screening colonoscopies per their recommendation. 6. Health maintenance. Urinalysis ordered. No blood work done as this is all done through her primary physician's office. Follow up one year, sooner as needed.  Note: This document was prepared with digital dictation and possible smart phrase technology. Any transcriptional errors that result from this process are unintentional.   Dara Lords MD, 3:19 PM 01/23/2013

## 2013-01-23 NOTE — Patient Instructions (Signed)
Follow up in one year for annual exam 

## 2013-01-24 LAB — URINALYSIS W MICROSCOPIC + REFLEX CULTURE
Bilirubin Urine: NEGATIVE
Crystals: NONE SEEN
Glucose, UA: NEGATIVE mg/dL
Hgb urine dipstick: NEGATIVE
Ketones, ur: NEGATIVE mg/dL
Leukocytes, UA: NEGATIVE
Nitrite: NEGATIVE
Protein, ur: NEGATIVE mg/dL
Specific Gravity, Urine: 1.025 (ref 1.005–1.030)
Urobilinogen, UA: 0.2 mg/dL (ref 0.0–1.0)
pH: 5 (ref 5.0–8.0)

## 2013-01-30 DIAGNOSIS — M9981 Other biomechanical lesions of cervical region: Secondary | ICD-10-CM | POA: Diagnosis not present

## 2013-01-30 DIAGNOSIS — M999 Biomechanical lesion, unspecified: Secondary | ICD-10-CM | POA: Diagnosis not present

## 2013-02-02 DIAGNOSIS — T84029A Dislocation of unspecified internal joint prosthesis, initial encounter: Secondary | ICD-10-CM | POA: Diagnosis not present

## 2013-02-05 DIAGNOSIS — B070 Plantar wart: Secondary | ICD-10-CM | POA: Diagnosis not present

## 2013-02-05 DIAGNOSIS — Z85828 Personal history of other malignant neoplasm of skin: Secondary | ICD-10-CM | POA: Diagnosis not present

## 2013-02-05 DIAGNOSIS — D0460 Carcinoma in situ of skin of unspecified upper limb, including shoulder: Secondary | ICD-10-CM | POA: Diagnosis not present

## 2013-02-05 DIAGNOSIS — L570 Actinic keratosis: Secondary | ICD-10-CM | POA: Diagnosis not present

## 2013-02-05 DIAGNOSIS — L821 Other seborrheic keratosis: Secondary | ICD-10-CM | POA: Diagnosis not present

## 2013-02-05 DIAGNOSIS — L259 Unspecified contact dermatitis, unspecified cause: Secondary | ICD-10-CM | POA: Diagnosis not present

## 2013-02-06 DIAGNOSIS — T84029A Dislocation of unspecified internal joint prosthesis, initial encounter: Secondary | ICD-10-CM | POA: Diagnosis not present

## 2013-02-12 DIAGNOSIS — R29898 Other symptoms and signs involving the musculoskeletal system: Secondary | ICD-10-CM | POA: Diagnosis not present

## 2013-02-13 DIAGNOSIS — M999 Biomechanical lesion, unspecified: Secondary | ICD-10-CM | POA: Diagnosis not present

## 2013-02-13 DIAGNOSIS — M9981 Other biomechanical lesions of cervical region: Secondary | ICD-10-CM | POA: Diagnosis not present

## 2013-02-14 DIAGNOSIS — R29898 Other symptoms and signs involving the musculoskeletal system: Secondary | ICD-10-CM | POA: Diagnosis not present

## 2013-02-16 ENCOUNTER — Ambulatory Visit: Payer: Self-pay | Admitting: Podiatrist

## 2013-02-19 DIAGNOSIS — R29898 Other symptoms and signs involving the musculoskeletal system: Secondary | ICD-10-CM | POA: Diagnosis not present

## 2013-02-21 DIAGNOSIS — R29898 Other symptoms and signs involving the musculoskeletal system: Secondary | ICD-10-CM | POA: Diagnosis not present

## 2013-02-26 DIAGNOSIS — R29898 Other symptoms and signs involving the musculoskeletal system: Secondary | ICD-10-CM | POA: Diagnosis not present

## 2013-02-27 DIAGNOSIS — M9981 Other biomechanical lesions of cervical region: Secondary | ICD-10-CM | POA: Diagnosis not present

## 2013-02-27 DIAGNOSIS — M999 Biomechanical lesion, unspecified: Secondary | ICD-10-CM | POA: Diagnosis not present

## 2013-02-28 DIAGNOSIS — R29898 Other symptoms and signs involving the musculoskeletal system: Secondary | ICD-10-CM | POA: Diagnosis not present

## 2013-03-02 ENCOUNTER — Other Ambulatory Visit: Payer: Self-pay | Admitting: Interventional Cardiology

## 2013-03-05 DIAGNOSIS — R29898 Other symptoms and signs involving the musculoskeletal system: Secondary | ICD-10-CM | POA: Diagnosis not present

## 2013-03-13 DIAGNOSIS — R29898 Other symptoms and signs involving the musculoskeletal system: Secondary | ICD-10-CM | POA: Diagnosis not present

## 2013-03-20 DIAGNOSIS — R29898 Other symptoms and signs involving the musculoskeletal system: Secondary | ICD-10-CM | POA: Diagnosis not present

## 2013-03-22 DIAGNOSIS — R29898 Other symptoms and signs involving the musculoskeletal system: Secondary | ICD-10-CM | POA: Diagnosis not present

## 2013-03-26 DIAGNOSIS — M9981 Other biomechanical lesions of cervical region: Secondary | ICD-10-CM | POA: Diagnosis not present

## 2013-03-26 DIAGNOSIS — M999 Biomechanical lesion, unspecified: Secondary | ICD-10-CM | POA: Diagnosis not present

## 2013-03-27 DIAGNOSIS — R29898 Other symptoms and signs involving the musculoskeletal system: Secondary | ICD-10-CM | POA: Diagnosis not present

## 2013-03-29 DIAGNOSIS — R29898 Other symptoms and signs involving the musculoskeletal system: Secondary | ICD-10-CM | POA: Diagnosis not present

## 2013-04-03 DIAGNOSIS — M999 Biomechanical lesion, unspecified: Secondary | ICD-10-CM | POA: Diagnosis not present

## 2013-04-03 DIAGNOSIS — M9981 Other biomechanical lesions of cervical region: Secondary | ICD-10-CM | POA: Diagnosis not present

## 2013-04-03 DIAGNOSIS — R29898 Other symptoms and signs involving the musculoskeletal system: Secondary | ICD-10-CM | POA: Diagnosis not present

## 2013-04-05 DIAGNOSIS — R29898 Other symptoms and signs involving the musculoskeletal system: Secondary | ICD-10-CM | POA: Diagnosis not present

## 2013-04-10 DIAGNOSIS — R29898 Other symptoms and signs involving the musculoskeletal system: Secondary | ICD-10-CM | POA: Diagnosis not present

## 2013-04-10 DIAGNOSIS — M9981 Other biomechanical lesions of cervical region: Secondary | ICD-10-CM | POA: Diagnosis not present

## 2013-04-10 DIAGNOSIS — M999 Biomechanical lesion, unspecified: Secondary | ICD-10-CM | POA: Diagnosis not present

## 2013-04-12 DIAGNOSIS — R29898 Other symptoms and signs involving the musculoskeletal system: Secondary | ICD-10-CM | POA: Diagnosis not present

## 2013-04-17 DIAGNOSIS — M9981 Other biomechanical lesions of cervical region: Secondary | ICD-10-CM | POA: Diagnosis not present

## 2013-04-17 DIAGNOSIS — M999 Biomechanical lesion, unspecified: Secondary | ICD-10-CM | POA: Diagnosis not present

## 2013-04-18 DIAGNOSIS — Z85828 Personal history of other malignant neoplasm of skin: Secondary | ICD-10-CM | POA: Diagnosis not present

## 2013-04-18 DIAGNOSIS — D485 Neoplasm of uncertain behavior of skin: Secondary | ICD-10-CM | POA: Diagnosis not present

## 2013-04-18 DIAGNOSIS — L84 Corns and callosities: Secondary | ICD-10-CM | POA: Diagnosis not present

## 2013-04-18 DIAGNOSIS — C44621 Squamous cell carcinoma of skin of unspecified upper limb, including shoulder: Secondary | ICD-10-CM | POA: Diagnosis not present

## 2013-04-18 DIAGNOSIS — B079 Viral wart, unspecified: Secondary | ICD-10-CM | POA: Diagnosis not present

## 2013-04-23 DIAGNOSIS — R269 Unspecified abnormalities of gait and mobility: Secondary | ICD-10-CM | POA: Diagnosis not present

## 2013-04-23 DIAGNOSIS — M6281 Muscle weakness (generalized): Secondary | ICD-10-CM | POA: Diagnosis not present

## 2013-04-23 DIAGNOSIS — M25559 Pain in unspecified hip: Secondary | ICD-10-CM | POA: Diagnosis not present

## 2013-04-26 DIAGNOSIS — R269 Unspecified abnormalities of gait and mobility: Secondary | ICD-10-CM | POA: Diagnosis not present

## 2013-04-26 DIAGNOSIS — M25559 Pain in unspecified hip: Secondary | ICD-10-CM | POA: Diagnosis not present

## 2013-04-26 DIAGNOSIS — M6281 Muscle weakness (generalized): Secondary | ICD-10-CM | POA: Diagnosis not present

## 2013-05-02 DIAGNOSIS — M999 Biomechanical lesion, unspecified: Secondary | ICD-10-CM | POA: Diagnosis not present

## 2013-05-02 DIAGNOSIS — M9981 Other biomechanical lesions of cervical region: Secondary | ICD-10-CM | POA: Diagnosis not present

## 2013-05-03 DIAGNOSIS — R269 Unspecified abnormalities of gait and mobility: Secondary | ICD-10-CM | POA: Diagnosis not present

## 2013-05-03 DIAGNOSIS — M6281 Muscle weakness (generalized): Secondary | ICD-10-CM | POA: Diagnosis not present

## 2013-05-03 DIAGNOSIS — M25559 Pain in unspecified hip: Secondary | ICD-10-CM | POA: Diagnosis not present

## 2013-05-09 DIAGNOSIS — M25559 Pain in unspecified hip: Secondary | ICD-10-CM | POA: Diagnosis not present

## 2013-05-09 DIAGNOSIS — R269 Unspecified abnormalities of gait and mobility: Secondary | ICD-10-CM | POA: Diagnosis not present

## 2013-05-09 DIAGNOSIS — M6281 Muscle weakness (generalized): Secondary | ICD-10-CM | POA: Diagnosis not present

## 2013-05-14 DIAGNOSIS — R269 Unspecified abnormalities of gait and mobility: Secondary | ICD-10-CM | POA: Diagnosis not present

## 2013-05-14 DIAGNOSIS — M25559 Pain in unspecified hip: Secondary | ICD-10-CM | POA: Diagnosis not present

## 2013-05-14 DIAGNOSIS — M6281 Muscle weakness (generalized): Secondary | ICD-10-CM | POA: Diagnosis not present

## 2013-05-16 DIAGNOSIS — M999 Biomechanical lesion, unspecified: Secondary | ICD-10-CM | POA: Diagnosis not present

## 2013-05-16 DIAGNOSIS — M9981 Other biomechanical lesions of cervical region: Secondary | ICD-10-CM | POA: Diagnosis not present

## 2013-05-18 DIAGNOSIS — M25559 Pain in unspecified hip: Secondary | ICD-10-CM | POA: Diagnosis not present

## 2013-05-18 DIAGNOSIS — M6281 Muscle weakness (generalized): Secondary | ICD-10-CM | POA: Diagnosis not present

## 2013-05-18 DIAGNOSIS — R269 Unspecified abnormalities of gait and mobility: Secondary | ICD-10-CM | POA: Diagnosis not present

## 2013-05-21 DIAGNOSIS — R269 Unspecified abnormalities of gait and mobility: Secondary | ICD-10-CM | POA: Diagnosis not present

## 2013-05-21 DIAGNOSIS — M6281 Muscle weakness (generalized): Secondary | ICD-10-CM | POA: Diagnosis not present

## 2013-05-21 DIAGNOSIS — M25559 Pain in unspecified hip: Secondary | ICD-10-CM | POA: Diagnosis not present

## 2013-05-24 DIAGNOSIS — H2640 Unspecified secondary cataract: Secondary | ICD-10-CM | POA: Diagnosis not present

## 2013-05-24 DIAGNOSIS — H353190 Nonexudative age-related macular degeneration, unspecified eye, stage unspecified: Secondary | ICD-10-CM | POA: Diagnosis not present

## 2013-05-24 DIAGNOSIS — Z961 Presence of intraocular lens: Secondary | ICD-10-CM | POA: Diagnosis not present

## 2013-05-25 DIAGNOSIS — M25559 Pain in unspecified hip: Secondary | ICD-10-CM | POA: Diagnosis not present

## 2013-05-25 DIAGNOSIS — M6281 Muscle weakness (generalized): Secondary | ICD-10-CM | POA: Diagnosis not present

## 2013-05-25 DIAGNOSIS — R269 Unspecified abnormalities of gait and mobility: Secondary | ICD-10-CM | POA: Diagnosis not present

## 2013-05-29 DIAGNOSIS — M25559 Pain in unspecified hip: Secondary | ICD-10-CM | POA: Diagnosis not present

## 2013-05-29 DIAGNOSIS — R269 Unspecified abnormalities of gait and mobility: Secondary | ICD-10-CM | POA: Diagnosis not present

## 2013-05-29 DIAGNOSIS — M6281 Muscle weakness (generalized): Secondary | ICD-10-CM | POA: Diagnosis not present

## 2013-05-30 DIAGNOSIS — M999 Biomechanical lesion, unspecified: Secondary | ICD-10-CM | POA: Diagnosis not present

## 2013-05-30 DIAGNOSIS — M9981 Other biomechanical lesions of cervical region: Secondary | ICD-10-CM | POA: Diagnosis not present

## 2013-06-01 DIAGNOSIS — M25559 Pain in unspecified hip: Secondary | ICD-10-CM | POA: Diagnosis not present

## 2013-06-01 DIAGNOSIS — R269 Unspecified abnormalities of gait and mobility: Secondary | ICD-10-CM | POA: Diagnosis not present

## 2013-06-01 DIAGNOSIS — M6281 Muscle weakness (generalized): Secondary | ICD-10-CM | POA: Diagnosis not present

## 2013-06-06 DIAGNOSIS — Z85828 Personal history of other malignant neoplasm of skin: Secondary | ICD-10-CM | POA: Diagnosis not present

## 2013-06-06 DIAGNOSIS — B070 Plantar wart: Secondary | ICD-10-CM | POA: Diagnosis not present

## 2013-06-11 DIAGNOSIS — R269 Unspecified abnormalities of gait and mobility: Secondary | ICD-10-CM | POA: Diagnosis not present

## 2013-06-11 DIAGNOSIS — M6281 Muscle weakness (generalized): Secondary | ICD-10-CM | POA: Diagnosis not present

## 2013-06-11 DIAGNOSIS — M25559 Pain in unspecified hip: Secondary | ICD-10-CM | POA: Diagnosis not present

## 2013-06-13 DIAGNOSIS — M999 Biomechanical lesion, unspecified: Secondary | ICD-10-CM | POA: Diagnosis not present

## 2013-06-13 DIAGNOSIS — M9981 Other biomechanical lesions of cervical region: Secondary | ICD-10-CM | POA: Diagnosis not present

## 2013-06-19 DIAGNOSIS — R29898 Other symptoms and signs involving the musculoskeletal system: Secondary | ICD-10-CM | POA: Diagnosis not present

## 2013-06-26 DIAGNOSIS — M999 Biomechanical lesion, unspecified: Secondary | ICD-10-CM | POA: Diagnosis not present

## 2013-06-26 DIAGNOSIS — M9981 Other biomechanical lesions of cervical region: Secondary | ICD-10-CM | POA: Diagnosis not present

## 2013-06-27 DIAGNOSIS — M9981 Other biomechanical lesions of cervical region: Secondary | ICD-10-CM | POA: Diagnosis not present

## 2013-06-27 DIAGNOSIS — M999 Biomechanical lesion, unspecified: Secondary | ICD-10-CM | POA: Diagnosis not present

## 2013-06-28 DIAGNOSIS — M9981 Other biomechanical lesions of cervical region: Secondary | ICD-10-CM | POA: Diagnosis not present

## 2013-06-28 DIAGNOSIS — M999 Biomechanical lesion, unspecified: Secondary | ICD-10-CM | POA: Diagnosis not present

## 2013-07-05 ENCOUNTER — Other Ambulatory Visit: Payer: Self-pay

## 2013-07-05 DIAGNOSIS — Z1231 Encounter for screening mammogram for malignant neoplasm of breast: Secondary | ICD-10-CM

## 2013-07-10 DIAGNOSIS — M9981 Other biomechanical lesions of cervical region: Secondary | ICD-10-CM | POA: Diagnosis not present

## 2013-07-10 DIAGNOSIS — M999 Biomechanical lesion, unspecified: Secondary | ICD-10-CM | POA: Diagnosis not present

## 2013-07-20 ENCOUNTER — Encounter: Payer: Self-pay | Admitting: Podiatrist

## 2013-07-20 ENCOUNTER — Ambulatory Visit (INDEPENDENT_AMBULATORY_CARE_PROVIDER_SITE_OTHER): Payer: Medicare Other | Admitting: Podiatrist

## 2013-07-20 VITALS — BP 136/70 | HR 67 | Resp 14 | Ht 64.5 in | Wt 160.0 lb

## 2013-07-20 DIAGNOSIS — B079 Viral wart, unspecified: Secondary | ICD-10-CM

## 2013-07-20 MED ORDER — IMIQUIMOD 5 % EX CREA: TOPICAL_CREAM | Freq: Every day | CUTANEOUS | Status: DC

## 2013-07-20 NOTE — Progress Notes (Signed)
   Subjective:    Patient ID: [name], female    DOB: [birth year], 80 y.o.   MRN: [id number]  HPI Comments: Pt states she has a plantar wart on her right foot, for over 1 year.  There is an area of hard, yellowed skin in the Right plantar 2nd MPJ area.  Pt states she's had the area scraped and then once frozen.  Pt states has used Salicylic acid patches.     Review of Systems  All other systems reviewed and are negative.      Objective:   Physical Exam  GENERAL APPEARANCE: Alert, conversant. Appropriately groomed. No acute distress.  VASCULAR: Pedal pulses palpable at 2/4 DP and PT bilateral.  Capillary refill time is immediate to all digits,  Proximal to distal cooling it warm to warm.  Digital hair growth is present bilateral  NEUROLOGIC: sensation is intact epicritically and protectively to 5.07 monofilament at 5/5 sites bilateral.  Light touch is intact bilateral, vibratory sensation intact bilateral, achilles tendon reflex is intact bilateral.  MUSCULOSKELETAL: Right foot has a prominent and plantarflexed second metatarsal in particular. She states she previously had a bunion surgery which caused a hallux varus and the Dr. Beola Cord fixed the hallux varus but  she still ended up with a messed up foot her left foot is rectus.  DERMATOLOGIC: Submetatarsal 2 of the right foot is a large hyperkeratotic lesion. Once debrided there are areas of pinpoint darkness and discoloration. Difficult to ascertain if this is a warty appearance versus hemosiderin deposits.  Due to the high pressure point and the foot callus is more likely however the patient states that she just developed this lesion recently.   Assessment & Plan:  Wart versus callus right foot  Plan: Recommended treating the area for wart. Called and Aldara cream and gave her instructions for its topical use in combination with salicylic acid. I will see her back in 3-4 weeks for recheck if there is no improvement we'll consider  biopsy versus offloading and orthotics for the callus.

## 2013-07-20 NOTE — Patient Instructions (Signed)
WARTS (Verrucae)  Warts are caused by a virus that has invaded the skin.  They are more common in young adults and children and a small percentage will resolve on their own.  There are many types of warts including mosaic warts (large flat), vulgaris (domed warts-have pearl like appearance), and plantar warts (flat or cauliflower like appearance).  Warts are highly contagious and may be picked up from any surface.  Warts thrive in a warm moist environment and are common near pools, showers, and locker room floors.  Any microscopic cut in the skin is where the virus enters and becomes a wart.  Warts are very difficult to treat and get rid of.  Patience is necessary in the treatment of this virus.  It may take months to cure and different methods may have to be used to get rid of your wart.  Standard Initial Treatment is: 1. Periodic debridement of the wart and application of Canthacur to each lesion (a blistering agent that will slough off the warty skin) 2. Dispensing of topical treatments/prescriptions to apply to the wart at home  Other options include: 1. Excision of the lesion-numbing the skin around the wart and cutting it out-requires daily soaks post-operatively and takes about 2-3 weeks to fully heal 2. Excision with CO2 Laser-Performed at the surgical center your foot is numbed up and the lesions are all cut out and then lasered with a high power laser.  Very good for multiple warts that are resistant. 3. Cimetidine (Tagamet)-Oral agent used in high does--has shown better results in children  How do I apply the standard topical treatments?  1. Salicylic Acid (Compound W wart remover liquid or gel-available at drug or grocery stores)-Apply a dime size thickness over the wart and cover with duct tape-apply at night so the medication does not spread out to the good skin.  The skin will turn white and slowly blister off.  Use a pumice stone daily to remove the white skin as best you can.  If  the skin gets too raw and painful, discontinue for a few days then resume. 2. Aldara (Imiquimod)-this is an immune response modifier.  They come in little packets so try to get at least 2 days out of each packet if you can.  Apply a small amount to the lesion and cover with duct tape.  Do not rub it in-let it absorb on its own.  Good to apply each morning.  Other Helpful Hints:  Wash shoes that can be washed in the washing machine 2-3 x per month with some bleach  Use Lysol in shoes that cannot be washed and wipe out with a cloth 1 x per week-allow to dry for 8 hours before wearing again  Use a bleach solution (1 part bleach to 3 parts water) in your tub or shower to reduce the spread of the virus to yourself and others  Use aqua socks or clean sandals when at the pool or locker room to reduce the chance of picking up the virus or spreading it to others 

## 2013-07-23 ENCOUNTER — Telehealth: Payer: Self-pay | Admitting: *Deleted

## 2013-07-23 DIAGNOSIS — L84 Corns and callosities: Secondary | ICD-10-CM

## 2013-07-23 NOTE — Telephone Encounter (Signed)
She told them to give me some pads.  They gave me 3.  Does that mean I have to buy more if I need them?  I told her yes.  She asked where she can purchase some.  I told her we sale them here.  She asked how much do they cost.  I told her $0.75 each.  She stated okay thanks.

## 2013-07-24 DIAGNOSIS — M999 Biomechanical lesion, unspecified: Secondary | ICD-10-CM | POA: Diagnosis not present

## 2013-07-24 DIAGNOSIS — M9981 Other biomechanical lesions of cervical region: Secondary | ICD-10-CM | POA: Diagnosis not present

## 2013-07-30 ENCOUNTER — Ambulatory Visit
Admission: RE | Admit: 2013-07-30 | Discharge: 2013-07-30 | Disposition: A | Discharge status: Home / Self Care | Payer: Medicare Other | Source: Ambulatory Visit

## 2013-07-30 DIAGNOSIS — Z1231 Encounter for screening mammogram for malignant neoplasm of breast: Secondary | ICD-10-CM

## 2013-08-07 DIAGNOSIS — M999 Biomechanical lesion, unspecified: Secondary | ICD-10-CM | POA: Diagnosis not present

## 2013-08-07 DIAGNOSIS — M9981 Other biomechanical lesions of cervical region: Secondary | ICD-10-CM | POA: Diagnosis not present

## 2013-08-08 DIAGNOSIS — L570 Actinic keratosis: Secondary | ICD-10-CM | POA: Diagnosis not present

## 2013-08-08 DIAGNOSIS — Z85828 Personal history of other malignant neoplasm of skin: Secondary | ICD-10-CM | POA: Diagnosis not present

## 2013-08-08 DIAGNOSIS — D485 Neoplasm of uncertain behavior of skin: Secondary | ICD-10-CM | POA: Diagnosis not present

## 2013-08-08 DIAGNOSIS — L819 Disorder of pigmentation, unspecified: Secondary | ICD-10-CM | POA: Diagnosis not present

## 2013-08-08 DIAGNOSIS — L408 Other psoriasis: Secondary | ICD-10-CM | POA: Diagnosis not present

## 2013-08-08 DIAGNOSIS — C44721 Squamous cell carcinoma of skin of unspecified lower limb, including hip: Secondary | ICD-10-CM | POA: Diagnosis not present

## 2013-08-10 ENCOUNTER — Ambulatory Visit: Payer: Medicare Other | Admitting: Podiatrist

## 2013-08-10 ENCOUNTER — Telehealth: Payer: Self-pay | Admitting: *Deleted

## 2013-08-10 NOTE — Telephone Encounter (Signed)
I was scheduled to see her today and they called and canceled.  My foot is killing me I can hardly walk.  She has me putting these little packets of medicine on it I can't remember the name of it.  I asked if it was Aldara she stated yes.  I asked if she was off-loading the area.  She stated yes, she's been wearing the little donut things.  She said I'm assuming she probably would have trimmed it today.  She asked what can she do?  Does she continue using the cream?  What does the doctor suggest?  She said she has an appointment on Monday with Dr. Amalia Wall but what does she do in the meantime?

## 2013-08-13 ENCOUNTER — Encounter: Payer: Self-pay | Admitting: Podiatry

## 2013-08-13 ENCOUNTER — Ambulatory Visit (INDEPENDENT_AMBULATORY_CARE_PROVIDER_SITE_OTHER): Payer: Medicare Other | Admitting: Podiatry

## 2013-08-13 VITALS — BP 140/69 | HR 70 | Resp 18

## 2013-08-13 DIAGNOSIS — L84 Corns and callosities: Secondary | ICD-10-CM

## 2013-08-13 NOTE — Telephone Encounter (Signed)
She likely needs it trimmed -- sorry her appt got cancelled Friday!  Thanks!

## 2013-08-13 NOTE — Progress Notes (Signed)
° °  Subjective:    Patient ID: [name], female    DOB: Sep 21, 1929, 80 y.o.   MRN: [id number]  HPI I saw Dr Valentina Lucks on 07/20/13 for the place on the ball of my right foot and it hurts I guess because it is on the bottom of my right foot and she put some stuff on it to help it Aldara cream plus salicylic acid was applied by Dr. Rolley Sims.  Patient is seen multiple doctors including dermatologist, orthopedic and podiatrist. The diagnosis verruca versus keratoses  Review of Systems  Allergic/Immunologic:       Shell fish  Hematological: Bruises/bleeds easily.  All other systems reviewed and are negative.      Objective:   Physical Exam  The plantar subsecond MPJ right has hemorrhagic keratoses in that area. After debridement of this area the hemorrhagic debris seems to reduce and does not to bleed with debridement.          Assessment & Plan:   Assessment: I favor the diagnosis of hemorrhagic hyperkeratoses rather than a wart because of the reduction and disappearance of the hemorrhagic tissue with debridement.  Plan: I advised patient that I thought most likely this lesion was a keratoses. The lesion was debrided further and packed with salinocaine  Reappoint x7 days for repetitive debridement and reapplication of salinocaine

## 2013-08-14 ENCOUNTER — Telehealth: Payer: Self-pay | Admitting: *Deleted

## 2013-08-14 ENCOUNTER — Ambulatory Visit: Payer: Medicare Other | Admitting: *Deleted

## 2013-08-14 ENCOUNTER — Telehealth: Payer: Self-pay

## 2013-08-14 DIAGNOSIS — B079 Viral wart, unspecified: Secondary | ICD-10-CM

## 2013-08-14 NOTE — Progress Notes (Signed)
   Subjective:    Patient ID: [name], female    DOB: [birth year], 80 y.o.   MRN: [id number]  HPI Pt was seen today for dressing change. She stated that she got her dressing wet while showering and needed it to be changed. Changed dressing, reapplied salicylic acid ointment and advised pt on how to keep foot dry when showering. Noted no drainage from wound. Pt to f/u in 1 week   Review of Systems     Objective:   Physical Exam        Assessment & Plan:

## 2013-08-14 NOTE — Telephone Encounter (Signed)
He fixed my foot yesterday.  Had a bandage on it.  Was supposed to keep it dry as possible.  I got it wet.  I'd like it redressed if someone can do it for me.  Give me a call.  I returned her call.  I asked if she can come in this afternoon.  She stated yes, I just need someone to check it, I had a plastic bag over it but the shower came down over it.  I transferred her to a scheduler to make an appointment for the nurse schedule.

## 2013-08-17 NOTE — Telephone Encounter (Signed)
Pt stated that she her dressing got wet in the shower and she wanted it changed, Removed old dressing and applied new clean dressing with syclicylic acid ointment, same as previous dressing. Advised pt to double wrap and keep as dry as possible.

## 2013-08-20 ENCOUNTER — Ambulatory Visit (INDEPENDENT_AMBULATORY_CARE_PROVIDER_SITE_OTHER): Payer: Medicare Other | Admitting: Podiatry

## 2013-08-20 ENCOUNTER — Encounter: Payer: Self-pay | Admitting: Podiatry

## 2013-08-20 VITALS — BP 142/75 | HR 57 | Resp 18

## 2013-08-20 DIAGNOSIS — Z85828 Personal history of other malignant neoplasm of skin: Secondary | ICD-10-CM | POA: Diagnosis not present

## 2013-08-20 DIAGNOSIS — L98499 Non-pressure chronic ulcer of skin of other sites with unspecified severity: Secondary | ICD-10-CM | POA: Diagnosis not present

## 2013-08-20 DIAGNOSIS — C44721 Squamous cell carcinoma of skin of unspecified lower limb, including hip: Secondary | ICD-10-CM | POA: Diagnosis not present

## 2013-08-20 DIAGNOSIS — L84 Corns and callosities: Secondary | ICD-10-CM

## 2013-08-20 NOTE — Progress Notes (Signed)
   Subjective:    Patient ID: [name], female    DOB: Sep 16, 1929, 80 y.o.   MRN: [id number]  HPI the ball of my left foot has been hurting for about 2 months and hurts when I walk and I can really feel it barefoot  Patient presents for followup care after application of salinocaine to the plantar skin lesions left foot on the visit of 08/14/2013    Review of Systems  Allergic/Immunologic: Positive for food allergies.       Shellfish  Hematological: Bruises/bleeds easily.  All other systems reviewed and are negative.      Objective:   Physical Exam   Macerated hemorrhagic hyperkeratotic tissue plantar subsecond third MPJ verrucae plantaris     Assessment & Plan:   Assessment: Vascular hyperkeratoses plantar left first MPJ versus verrucae plantaris I favor hemorrhagic hyperkeratoses because of lesions are on the weightbearing area  Plan: Hyperkeratotic tissue was debrided and packed with salinocaine for treatment #2  Reappoint x7 days

## 2013-08-21 DIAGNOSIS — M9981 Other biomechanical lesions of cervical region: Secondary | ICD-10-CM | POA: Diagnosis not present

## 2013-08-21 DIAGNOSIS — M999 Biomechanical lesion, unspecified: Secondary | ICD-10-CM | POA: Diagnosis not present

## 2013-08-29 ENCOUNTER — Encounter: Payer: Self-pay | Admitting: Podiatry

## 2013-08-29 ENCOUNTER — Ambulatory Visit: Payer: Medicare Other | Admitting: Podiatry

## 2013-08-29 VITALS — BP 137/69 | HR 69 | Resp 14 | Ht 65.0 in | Wt 157.0 lb

## 2013-08-29 DIAGNOSIS — L84 Corns and callosities: Secondary | ICD-10-CM | POA: Diagnosis not present

## 2013-08-29 DIAGNOSIS — M9981 Other biomechanical lesions of cervical region: Secondary | ICD-10-CM | POA: Diagnosis not present

## 2013-08-29 DIAGNOSIS — M999 Biomechanical lesion, unspecified: Secondary | ICD-10-CM | POA: Diagnosis not present

## 2013-08-29 NOTE — Patient Instructions (Signed)
Apply a nonmedicated callus cushion daily to the right foot Return as needed Will consider custom foot orthotic with pocket to offload painful areas on the bottom of the right and left feet

## 2013-08-30 NOTE — Progress Notes (Signed)
Patient ID: [name], female   DOB: 1929/09/17, 80 y.o.   MRN: [id number]  Subjective: This patient presents after 2 treatments with salinocaine for vascular hyperkeratoses plantar right foot. Patient presents with some skin that sloughed in the plantar right first MPJ area  Objective: Macerated hyperkeratotic tissue plantar right lesser MPJ. There is no surrounding erythema, edema, warmth or active drainage.  Assessment: Reducing hyperkeratotic tissue plantar right Sensitivity and hyperkeratotic lesion  Plan: A small amount of residual hyperkeratotic tissue was debrided without a bleeding Patient advised that she has a chronic recurrent problem. I recommend periodic debridement at patient's request Also, I suggested a possible accommodative foot orthotic with pocket accommodation to offload the plantar MPJ bilaterally  Reappoint at patient's request

## 2013-09-17 DIAGNOSIS — Z23 Encounter for immunization: Secondary | ICD-10-CM | POA: Diagnosis not present

## 2013-09-17 DIAGNOSIS — I10 Essential (primary) hypertension: Secondary | ICD-10-CM | POA: Diagnosis not present

## 2013-09-17 DIAGNOSIS — D649 Anemia, unspecified: Secondary | ICD-10-CM | POA: Diagnosis not present

## 2013-09-17 DIAGNOSIS — Z1331 Encounter for screening for depression: Secondary | ICD-10-CM | POA: Diagnosis not present

## 2013-09-17 DIAGNOSIS — E782 Mixed hyperlipidemia: Secondary | ICD-10-CM | POA: Diagnosis not present

## 2013-09-17 DIAGNOSIS — Z0000 Encounter for general adult medical examination without abnormal findings: Secondary | ICD-10-CM | POA: Diagnosis not present

## 2013-09-18 DIAGNOSIS — M9981 Other biomechanical lesions of cervical region: Secondary | ICD-10-CM | POA: Diagnosis not present

## 2013-09-18 DIAGNOSIS — M999 Biomechanical lesion, unspecified: Secondary | ICD-10-CM | POA: Diagnosis not present

## 2013-09-19 DIAGNOSIS — L259 Unspecified contact dermatitis, unspecified cause: Secondary | ICD-10-CM | POA: Diagnosis not present

## 2013-09-19 DIAGNOSIS — Z85828 Personal history of other malignant neoplasm of skin: Secondary | ICD-10-CM | POA: Diagnosis not present

## 2013-09-19 DIAGNOSIS — I8310 Varicose veins of unspecified lower extremity with inflammation: Secondary | ICD-10-CM | POA: Diagnosis not present

## 2013-09-19 DIAGNOSIS — L408 Other psoriasis: Secondary | ICD-10-CM | POA: Diagnosis not present

## 2013-09-24 ENCOUNTER — Encounter: Payer: Self-pay | Admitting: Podiatry

## 2013-09-24 ENCOUNTER — Ambulatory Visit (INDEPENDENT_AMBULATORY_CARE_PROVIDER_SITE_OTHER): Payer: Medicare Other | Admitting: Podiatry

## 2013-09-24 VITALS — BP 134/67 | HR 64 | Resp 18

## 2013-09-24 DIAGNOSIS — L84 Corns and callosities: Secondary | ICD-10-CM

## 2013-09-25 NOTE — Progress Notes (Signed)
Patient ID: [name], female   DOB: 02/28/1930, 80 y.o.   MRN: [id number]    Subjective: This patient presents for ongoing treatment for painful hyperkeratotic lesion on the plantar aspect of the right foot. She's had multiple treatments of debridement and application of salinocaine  Objective: Hemorrhagic hyperkeratotic lesion localized to the MPJ area on the right foot  Assessment  vascular hyperkeratotic tissue  Plan: The hyperkeratotic tissue was debrided and packed with salinocaine  Reappoint at patient's request

## 2013-10-01 DIAGNOSIS — R946 Abnormal results of thyroid function studies: Secondary | ICD-10-CM | POA: Diagnosis not present

## 2013-10-02 DIAGNOSIS — M999 Biomechanical lesion, unspecified: Secondary | ICD-10-CM | POA: Diagnosis not present

## 2013-10-02 DIAGNOSIS — M9981 Other biomechanical lesions of cervical region: Secondary | ICD-10-CM | POA: Diagnosis not present

## 2013-10-02 DIAGNOSIS — Z85828 Personal history of other malignant neoplasm of skin: Secondary | ICD-10-CM | POA: Diagnosis not present

## 2013-10-02 DIAGNOSIS — IMO0002 Reserved for concepts with insufficient information to code with codable children: Secondary | ICD-10-CM | POA: Diagnosis not present

## 2013-10-16 DIAGNOSIS — M9981 Other biomechanical lesions of cervical region: Secondary | ICD-10-CM | POA: Diagnosis not present

## 2013-10-16 DIAGNOSIS — M999 Biomechanical lesion, unspecified: Secondary | ICD-10-CM | POA: Diagnosis not present

## 2013-10-30 DIAGNOSIS — M9981 Other biomechanical lesions of cervical region: Secondary | ICD-10-CM | POA: Diagnosis not present

## 2013-10-30 DIAGNOSIS — M999 Biomechanical lesion, unspecified: Secondary | ICD-10-CM | POA: Diagnosis not present

## 2013-11-13 DIAGNOSIS — M999 Biomechanical lesion, unspecified: Secondary | ICD-10-CM | POA: Diagnosis not present

## 2013-11-13 DIAGNOSIS — M9981 Other biomechanical lesions of cervical region: Secondary | ICD-10-CM | POA: Diagnosis not present

## 2013-11-20 DIAGNOSIS — S93409A Sprain of unspecified ligament of unspecified ankle, initial encounter: Secondary | ICD-10-CM | POA: Diagnosis not present

## 2013-11-26 ENCOUNTER — Telehealth: Payer: Self-pay | Admitting: Interventional Cardiology

## 2013-11-26 NOTE — Telephone Encounter (Signed)
Is she concerned about the blood pressure elevation, palpitations or both.? See how she is doing currently. His any thing we need to do?

## 2013-11-26 NOTE — Telephone Encounter (Signed)
New problem   Pt's heart has been beating really hard. Want to speak to nurse concerning this matter.

## 2013-11-26 NOTE — Telephone Encounter (Signed)
returned pt call. pt sts that on 9/11 and 9/12 shw as having palpitations on and off. pt denies chest pain,sob,swelling.pt monitored her bp and heart rate with readings as followed 9/13 187/116 71bpm 9/14 161/78 69 bpm pt was previously instructed to take Diovan 160mg  for systolic bp >103  Pt sts that she did take diovan 160mg   On 9/13 And will take 1 tablet today  Pt sts that she is doing ok today and has not had ant repeat of palpitations since Saturday.  Pt adv that [name] is out of the office today, I will fwd him an update and call back with his recommendations. Pt agreeable and verbalized understanding.

## 2013-11-27 DIAGNOSIS — M999 Biomechanical lesion, unspecified: Secondary | ICD-10-CM | POA: Diagnosis not present

## 2013-11-27 DIAGNOSIS — M9981 Other biomechanical lesions of cervical region: Secondary | ICD-10-CM | POA: Diagnosis not present

## 2013-11-27 NOTE — Telephone Encounter (Signed)
New Message  Pt called to follow up//sr

## 2013-11-27 NOTE — Telephone Encounter (Signed)
Follow up         Pt states she is feeling well and may not need the monitor / pt is unhappy with the way this was handled / pt is switching appts with husband for Friday 9/18

## 2013-11-28 ENCOUNTER — Encounter: Payer: Self-pay | Admitting: Physician Assistant

## 2013-11-28 ENCOUNTER — Ambulatory Visit (INDEPENDENT_AMBULATORY_CARE_PROVIDER_SITE_OTHER): Payer: Medicare Other | Admitting: Physician Assistant

## 2013-11-28 VITALS — BP 160/88 | HR 60 | Ht 65.0 in | Wt 163.0 lb

## 2013-11-28 DIAGNOSIS — R002 Palpitations: Secondary | ICD-10-CM

## 2013-11-28 DIAGNOSIS — I359 Nonrheumatic aortic valve disorder, unspecified: Secondary | ICD-10-CM

## 2013-11-28 DIAGNOSIS — I10 Essential (primary) hypertension: Secondary | ICD-10-CM | POA: Diagnosis not present

## 2013-11-28 DIAGNOSIS — I351 Nonrheumatic aortic (valve) insufficiency: Secondary | ICD-10-CM

## 2013-11-28 DIAGNOSIS — I712 Thoracic aortic aneurysm, without rupture, unspecified: Secondary | ICD-10-CM | POA: Diagnosis not present

## 2013-11-28 LAB — CBC WITH DIFFERENTIAL/PLATELET
Basophils Absolute: 0.1 10*3/uL (ref 0.0–0.1)
Basophils Relative: 1.9 % (ref 0.0–3.0)
Eosinophils Absolute: 0.6 10*3/uL (ref 0.0–0.7)
Eosinophils Relative: 8.1 % — ABNORMAL HIGH (ref 0.0–5.0)
HCT: 29.9 % — ABNORMAL LOW (ref 36.0–46.0)
Hemoglobin: 10 g/dL — ABNORMAL LOW (ref 12.0–15.0)
Lymphocytes Relative: 31.1 % (ref 12.0–46.0)
Lymphs Abs: 2.2 10*3/uL (ref 0.7–4.0)
MCHC: 33.4 g/dL (ref 30.0–36.0)
MCV: 84.9 fl (ref 78.0–100.0)
Monocytes Absolute: 0.6 10*3/uL (ref 0.1–1.0)
Monocytes Relative: 8.8 % (ref 3.0–12.0)
Neutro Abs: 3.6 10*3/uL (ref 1.4–7.7)
Neutrophils Relative %: 50.1 % (ref 43.0–77.0)
Platelets: 234 10*3/uL (ref 150.0–400.0)
RBC: 3.52 Mil/uL — ABNORMAL LOW (ref 3.87–5.11)
RDW: 14.5 % (ref 11.5–15.5)
WBC: 7.2 10*3/uL (ref 4.0–10.5)

## 2013-11-28 MED ORDER — VALSARTAN 160 MG PO TABS
160.0000 mg | ORAL_TABLET | Freq: Two times a day (BID) | ORAL | Status: DC
Start: 2013-11-28 — End: 2014-07-17

## 2013-11-28 NOTE — Telephone Encounter (Signed)
returned pt call. lmtcb 

## 2013-11-28 NOTE — Telephone Encounter (Signed)
returned pt call. pt seen today by Kathleen Argue, PA. and concerns were addressed

## 2013-11-28 NOTE — Progress Notes (Signed)
Cardiology Office Note    Date:  11/28/2013   ID:  [name], DOB 03/10/1930, MRN [id number]  PCP:  Kandice Hams, MD  Cardiologist:  Dr. Daneen Schick     History of Present Illness: [name] is a 80 y.o. female with a hx of recurrent pericarditis, PSVT, HTN, mild AI and ascending thoracic aortic aneurysm followed by Dr. Cyndia Bent.  Last seen by Dr. Cyndia Bent in 12/2011.  Aneurysm was stable at 4.6 cm (no change in size for 6 years).  Plan was for 2 year FU.  Last seen by Dr. Daneen Schick 12/2012.    She returns today for the evaluation of palpitations since last weekend. The palpitations feel like a fluttering.  She denies any rapid HR.  She denies syncope.  She denies dyspnea.  She denies orthopnea, PND, edema.  She has some discomfort in her L chest at times.  No tearing or ripping sensation.  No pleuritic chest pain or pain with lying supine. She exercises 5 x a week.  She denies exertional chest discomfort.  She denies jaw pain.  BP has been quite high at home. She has taken Diovan once a day for the last 3 days.  BP has remained high.  She does not feel right.  Notes some blurry vision, especially with bending over.     Studies:  - Echo (11/2010):  Mild to mod AI, mod Ao root dilatation, EF 60-65%, mild LVH, Gr 2 DD, small post eff, trace MR, trace PI, trivial TR (LVIDd 3.96 cm).  - Nuclear (6/00):  No ischemia or scar, EF 66%   Recent Labs/Images: 12/19/2012: Creatinine 1.20*; Hemoglobin 9.2*; Potassium 4.2   Chest MRA 12/13/2011:  IMPRESSION:   Stable aneurysmal disease of the ascending thoracic aorta with maximal diameter of 4.6 cm.   Wt Readings from Last 3 Encounters:  08/29/13 157 lb (71.215 kg)  07/20/13 160 lb (72.576 kg)  01/23/13 160 lb (72.576 kg)     Past Medical History  Diagnosis Date  . Pericarditis     RECURRENT   . Hypertension   . Thoracic aneurysm     "STABLE ANEURYSMAL DISEASE OF THE ASCENDING THORACIC AORTA WITH MAXIMAL DIAMETER OF 4.6"  PER MRI CHEST 12/13/11 -AND LAST OFFICE NOTE DR. BARTLE IN [facility] 12/14/2011  . Heart murmur     "MILD AORTIC INSUFFICIENCY" PER CARDIOLOGY OFFICE NOTE DR. Linard Millers 12/01/11  . Anemia     CHRONIC  . Dysrhythmia     HX OF PAROXYSMAL SUPRAVENTRICULAR TACHYCARDIA ONCE - PT STATES NO PROBLEM WITH RE-OCCURANCE  . Chronic kidney disease     PT TOLD "MILD KIDNEY DISEASE FROM ALL THE DYE USED FOR XRAYS"  . GERD (gastroesophageal reflux disease)   . Cancer     SKIN CANCERS REMOVED FROM LEGS  . Arthritis     OA-SOME BACK AND NECK PAIN,  GOES TO CHIROPRACTOR TWICE A MONTH;  HX OF JOINT REPLACEMENTS   . Anemia requiring transfusions     POST OPERATIVELY    Current Outpatient Prescriptions  Medication Sig Dispense Refill  . amoxicillin (AMOXIL) 500 MG capsule       . aspirin 81 MG tablet Take 81 mg by mouth daily.      . Calcium Carbonate-Vit D-Min (CALCIUM 1200 PO) Take 1 tablet by mouth daily.       . carboxymethylcellulose (REFRESH TEARS) 0.5 % SOLN Place 1 drop into both eyes 2 (two) times daily as needed (dry eyes).      Marland Kitchen  carvedilol (COREG) 12.5 MG tablet Take 6.25-12.5 mg by mouth 2 (two) times daily. Takes 1/2 tablet in am and 1 tablet at night      . cholecalciferol (VITAMIN D) 1000 UNITS tablet Take 1,000 Units by mouth daily.      . clobetasol cream (TEMOVATE) 0.05 %       . diltiazem (CARDIZEM CD) 240 MG 24 hr capsule Take 1 capsule (240 mg total) by mouth daily.  90 capsule  3  . estrogens, conjugated, (PREMARIN) 0.3 MG tablet Take 1 tablet (0.3 mg total) by mouth daily.  90 tablet  4  . fish oil-omega-3 fatty acids 1000 MG capsule Take 2 g by mouth daily.      . imiquimod (ALDARA) 5 % cream Apply topically at bedtime.  12 each  0  . meloxicam (MOBIC) 7.5 MG tablet Take 7.5 mg by mouth daily as needed for pain.       Marland Kitchen omeprazole (PRILOSEC) 20 MG capsule Take 20 mg by mouth daily.      . traMADol (ULTRAM) 50 MG tablet Take 1-2 tablets (50-100 mg total) by mouth every 6 (six) hours as  needed for pain.  30 tablet  0  . valsartan (DIOVAN) 160 MG tablet Take 160 mg by mouth daily as needed (pt takes if BP > 140).        No current facility-administered medications for this visit.     Allergies:   Clindamycin/lincomycin; Cephalexin; Ciprofloxacin; Shellfish allergy; Sulfa antibiotics; and Sulfites   Social History:  The patient  reports that she has quit smoking. Her smoking use included Cigarettes. She smoked 0.00 packs per day. She has never used smokeless tobacco. She reports that she does not drink alcohol or use illicit drugs.   Family History:  The patient's family history includes Heart attack (age of onset: 32) in her father; Hypertension in her mother; Stroke in her mother.   ROS:  Please see the history of present illness.      All other systems reviewed and negative.   PHYSICAL EXAM: VS:  BP 160/88  Pulse 60  Ht 5\' 5"  (1.651 m)  Wt 163 lb (73.936 kg)  BMI 27.12 kg/m2 BP:  R 160/80;  L 160/78  Well nourished, well developed, in no acute distress HEENT: normal Neck: no JVD Cardiac:  normal S1, S2; RRR; 1/6 diastolic murmurLSB Lungs:  clear to auscultation bilaterally, no wheezing, rhonchi or rales Abd: soft, nontender, no hepatomegaly Ext: trace bilateral LE edema Skin: warm and dry Neuro:  CNs 2-12 intact, no focal abnormalities noted  EKG:  NSR, HR 60, normal axis, no ST changes      ASSESSMENT AND PLAN:  1. Palpitations:  Etiology not clear.  Symptoms do not sound like recurrent SVT.  She did not tolerate higher doses of beta blocker in the past.  Continue current dose of beta blocker, CCB.  Arrange event monitor, 2D Echo.  Obtain most recent TSH from PCP (TSH 10/01/13:  4.31).  Check BMET and CBC today.   2. Unspecified essential hypertension:  BP has been higher. I suspect this has been causing a lot of her current symptoms. I have recommended increasing Diovan to 160 mg bid.  She can hold this if her systolic BP is less than 038.  Repeat BMET in 1  week.   3. Thoracic aneurysm without mention of rupture:  Followed by Dr. Cyndia Bent.  Recent chest pain very atypical.  BP in both arms is equal.  Recommend  FU with Dr. Cyndia Bent as planned.  4. Aortic regurgitation:  AI sounds mild.  Will repeat echo.  This will allow to survey size of Ao root and ascending aorta.  Disposition:  FU with Dr. Daneen Schick in 6 weeks.    Signed, Versie Starks, MHS 11/28/2013 12:19 PM    [facility] [place], [place], [place]  [address] Phone: [phone]; Fax: [phone]

## 2013-11-28 NOTE — Telephone Encounter (Signed)
Pt returned call

## 2013-11-28 NOTE — Patient Instructions (Signed)
LAB WORK TODAY; BMET, CBC W/DIFF  REPEAT LAB WORK TO  BE DONE IN 1 WEEK  YOU WILL NEED TO HAVE AN ECHOCARDIOGRAM DONE  YOU WILL NEED TO BE SCHEDULED TO WEAR A HEART MONITOR; EVENT MONITOR  YOU WILL NEED  A FOLLOW UP WITH DR. Tamala Julian IN 4-6 WEEKS

## 2013-11-29 LAB — BASIC METABOLIC PANEL
BUN: 26 mg/dL — ABNORMAL HIGH (ref 6–23)
CO2: 25 mEq/L (ref 19–32)
Calcium: 9 mg/dL (ref 8.4–10.5)
Chloride: 99 mEq/L (ref 96–112)
Creatinine, Ser: 1.2 mg/dL (ref 0.4–1.2)
GFR: 46.62 mL/min — ABNORMAL LOW (ref >60.00)
Glucose, Bld: 95 mg/dL (ref 70–99)
Potassium: 4.7 mEq/L (ref 3.5–5.1)
Sodium: 132 mEq/L — ABNORMAL LOW (ref 135–145)

## 2013-11-30 ENCOUNTER — Telehealth: Payer: Self-pay | Admitting: *Deleted

## 2013-11-30 ENCOUNTER — Ambulatory Visit: Payer: Medicare Other | Admitting: Interventional Cardiology

## 2013-11-30 NOTE — Telephone Encounter (Signed)
pt notified about lab results with verbal understanding  

## 2013-12-04 ENCOUNTER — Other Ambulatory Visit: Payer: Self-pay | Admitting: *Deleted

## 2013-12-04 DIAGNOSIS — I712 Thoracic aortic aneurysm, without rupture, unspecified: Secondary | ICD-10-CM

## 2013-12-05 ENCOUNTER — Observation Stay (HOSPITAL_COMMUNITY): Payer: Medicare Other | Admitting: Certified Registered"

## 2013-12-05 ENCOUNTER — Encounter (HOSPITAL_COMMUNITY): Payer: Self-pay | Admitting: Emergency Medicine

## 2013-12-05 ENCOUNTER — Encounter (HOSPITAL_COMMUNITY): Payer: Medicare Other | Admitting: Certified Registered"

## 2013-12-05 ENCOUNTER — Encounter (HOSPITAL_COMMUNITY)
Admission: EM | Disposition: A | Discharge status: Home / Self Care | Payer: Self-pay | Source: Home / Self Care | Attending: Emergency Medicine

## 2013-12-05 ENCOUNTER — Observation Stay (HOSPITAL_COMMUNITY): Payer: Medicare Other

## 2013-12-05 ENCOUNTER — Other Ambulatory Visit (HOSPITAL_COMMUNITY): Payer: Medicare Other

## 2013-12-05 ENCOUNTER — Observation Stay (HOSPITAL_COMMUNITY)
Admission: EM | Admit: 2013-12-05 | Discharge: 2013-12-05 | Disposition: A | Discharge status: Home / Self Care | Payer: Medicare Other | Attending: Orthopedic Surgery | Admitting: Orthopedic Surgery

## 2013-12-05 ENCOUNTER — Emergency Department (HOSPITAL_COMMUNITY): Payer: Medicare Other

## 2013-12-05 DIAGNOSIS — K219 Gastro-esophageal reflux disease without esophagitis: Secondary | ICD-10-CM | POA: Diagnosis not present

## 2013-12-05 DIAGNOSIS — Z87891 Personal history of nicotine dependence: Secondary | ICD-10-CM | POA: Insufficient documentation

## 2013-12-05 DIAGNOSIS — I712 Thoracic aortic aneurysm, without rupture, unspecified: Secondary | ICD-10-CM | POA: Diagnosis not present

## 2013-12-05 DIAGNOSIS — X500XXA Overexertion from strenuous movement or load, initial encounter: Secondary | ICD-10-CM | POA: Diagnosis not present

## 2013-12-05 DIAGNOSIS — Z882 Allergy status to sulfonamides status: Secondary | ICD-10-CM | POA: Diagnosis not present

## 2013-12-05 DIAGNOSIS — Y998 Other external cause status: Secondary | ICD-10-CM | POA: Diagnosis not present

## 2013-12-05 DIAGNOSIS — I471 Supraventricular tachycardia, unspecified: Secondary | ICD-10-CM | POA: Diagnosis not present

## 2013-12-05 DIAGNOSIS — R011 Cardiac murmur, unspecified: Secondary | ICD-10-CM | POA: Insufficient documentation

## 2013-12-05 DIAGNOSIS — T84029A Dislocation of unspecified internal joint prosthesis, initial encounter: Secondary | ICD-10-CM | POA: Diagnosis not present

## 2013-12-05 DIAGNOSIS — Z96659 Presence of unspecified artificial knee joint: Secondary | ICD-10-CM | POA: Diagnosis not present

## 2013-12-05 DIAGNOSIS — S73006A Unspecified dislocation of unspecified hip, initial encounter: Secondary | ICD-10-CM | POA: Diagnosis not present

## 2013-12-05 DIAGNOSIS — Z881 Allergy status to other antibiotic agents status: Secondary | ICD-10-CM | POA: Insufficient documentation

## 2013-12-05 DIAGNOSIS — N189 Chronic kidney disease, unspecified: Secondary | ICD-10-CM | POA: Insufficient documentation

## 2013-12-05 DIAGNOSIS — M159 Polyosteoarthritis, unspecified: Secondary | ICD-10-CM | POA: Diagnosis not present

## 2013-12-05 DIAGNOSIS — I129 Hypertensive chronic kidney disease with stage 1 through stage 4 chronic kidney disease, or unspecified chronic kidney disease: Secondary | ICD-10-CM | POA: Insufficient documentation

## 2013-12-05 DIAGNOSIS — Y92009 Unspecified place in unspecified non-institutional (private) residence as the place of occurrence of the external cause: Secondary | ICD-10-CM | POA: Diagnosis not present

## 2013-12-05 DIAGNOSIS — Z96649 Presence of unspecified artificial hip joint: Secondary | ICD-10-CM | POA: Insufficient documentation

## 2013-12-05 DIAGNOSIS — Z91013 Allergy to seafood: Secondary | ICD-10-CM | POA: Insufficient documentation

## 2013-12-05 DIAGNOSIS — I10 Essential (primary) hypertension: Secondary | ICD-10-CM | POA: Diagnosis not present

## 2013-12-05 DIAGNOSIS — Y93E1 Activity, personal bathing and showering: Secondary | ICD-10-CM | POA: Diagnosis not present

## 2013-12-05 DIAGNOSIS — T148XXA Other injury of unspecified body region, initial encounter: Secondary | ICD-10-CM | POA: Diagnosis not present

## 2013-12-05 DIAGNOSIS — S73005A Unspecified dislocation of left hip, initial encounter: Secondary | ICD-10-CM

## 2013-12-05 HISTORY — PX: HIP CLOSED REDUCTION: SHX983

## 2013-12-05 LAB — CBC WITH DIFFERENTIAL/PLATELET
Basophils Absolute: 0.1 10*3/uL (ref 0.0–0.1)
Basophils Relative: 1 % (ref 0–1)
Eosinophils Absolute: 0.3 10*3/uL (ref 0.0–0.7)
Eosinophils Relative: 3 % (ref 0–5)
HCT: 27.7 % — ABNORMAL LOW (ref 36.0–46.0)
Hemoglobin: 9.3 g/dL — ABNORMAL LOW (ref 12.0–15.0)
Lymphocytes Relative: 18 % (ref 12–46)
Lymphs Abs: 1.5 10*3/uL (ref 0.7–4.0)
MCH: 28.4 pg (ref 26.0–34.0)
MCHC: 33.6 g/dL (ref 30.0–36.0)
MCV: 84.7 fL (ref 78.0–100.0)
Monocytes Absolute: 0.7 10*3/uL (ref 0.1–1.0)
Monocytes Relative: 9 % (ref 3–12)
Neutro Abs: 5.8 10*3/uL (ref 1.7–7.7)
Neutrophils Relative %: 69 % (ref 43–77)
Platelets: 198 10*3/uL (ref 150–400)
RBC: 3.27 MIL/uL — ABNORMAL LOW (ref 3.87–5.11)
RDW: 14.1 % (ref 11.5–15.5)
WBC: 8.4 10*3/uL (ref 4.0–10.5)

## 2013-12-05 LAB — BASIC METABOLIC PANEL
Anion gap: 12 (ref 5–15)
BUN: 20 mg/dL (ref 6–23)
CO2: 22 mEq/L (ref 19–32)
Calcium: 8.3 mg/dL — ABNORMAL LOW (ref 8.4–10.5)
Chloride: 102 mEq/L (ref 96–112)
Creatinine, Ser: 0.98 mg/dL (ref 0.50–1.10)
GFR calc Af Amer: 61 mL/min — ABNORMAL LOW (ref >90)
GFR calc non Af Amer: 53 mL/min — ABNORMAL LOW (ref >90)
Glucose, Bld: 89 mg/dL (ref 70–99)
Potassium: 4.3 mEq/L (ref 3.7–5.3)
Sodium: 136 mEq/L — ABNORMAL LOW (ref 137–147)

## 2013-12-05 SURGERY — CLOSED MANIPULATION, JOINT, HIP
Anesthesia: General | Laterality: Left

## 2013-12-05 MED ORDER — CARVEDILOL 12.5 MG PO TABS
12.5000 mg | ORAL_TABLET | Freq: Every day | ORAL | Status: DC
Start: 2013-12-05 — End: 2013-12-05
  Filled 2013-12-05: qty 1

## 2013-12-05 MED ORDER — FENTANYL CITRATE 0.05 MG/ML IJ SOLN
50.0000 ug | Freq: Once | INTRAMUSCULAR | Status: AC
  Administered 2013-12-05: 50 ug via INTRAVENOUS
  Filled 2013-12-05: qty 2

## 2013-12-05 MED ORDER — ONDANSETRON HCL 4 MG/2ML IJ SOLN
INTRAMUSCULAR | Status: AC
  Filled 2013-12-05: qty 2

## 2013-12-05 MED ORDER — PROPOFOL 10 MG/ML IV EMUL
INTRAVENOUS | Status: AC | PRN
  Administered 2013-12-05: 20 mL via INTRAVENOUS

## 2013-12-05 MED ORDER — CARBOXYMETHYLCELLULOSE SODIUM 0.5 % OP SOLN: 1.0000 [drp] | Freq: Two times a day (BID) | OPHTHALMIC | Status: DC | PRN

## 2013-12-05 MED ORDER — FENTANYL CITRATE 0.05 MG/ML IJ SOLN
25.0000 ug | INTRAMUSCULAR | Status: DC | PRN
Start: 2013-12-05 — End: 2013-12-05

## 2013-12-05 MED ORDER — IRBESARTAN 150 MG PO TABS
150.0000 mg | ORAL_TABLET | Freq: Every day | ORAL | Status: DC
Start: 2013-12-05 — End: 2013-12-05
  Filled 2013-12-05: qty 1

## 2013-12-05 MED ORDER — SUCCINYLCHOLINE CHLORIDE 20 MG/ML IJ SOLN
INTRAMUSCULAR | Status: DC | PRN
  Administered 2013-12-05: 100 mg via INTRAVENOUS

## 2013-12-05 MED ORDER — FENTANYL CITRATE 0.05 MG/ML IJ SOLN
50.0000 ug | Freq: Once | INTRAMUSCULAR | Status: DC
  Filled 2013-12-05: qty 2

## 2013-12-05 MED ORDER — LIDOCAINE HCL (CARDIAC) 20 MG/ML IV SOLN
INTRAVENOUS | Status: AC
  Filled 2013-12-05: qty 5

## 2013-12-05 MED ORDER — MORPHINE SULFATE 2 MG/ML IJ SOLN
2.0000 mg | INTRAMUSCULAR | Status: DC | PRN
  Administered 2013-12-05: 2 mg via INTRAVENOUS
  Filled 2013-12-05: qty 1

## 2013-12-05 MED ORDER — PROPOFOL 10 MG/ML IV BOLUS
INTRAVENOUS | Status: AC
Start: 2013-12-05 — End: 2013-12-05
  Filled 2013-12-05: qty 20

## 2013-12-05 MED ORDER — VITAMIN D3 25 MCG (1000 UNIT) PO TABS: 1000.0000 [IU] | ORAL_TABLET | Freq: Every day | ORAL | Status: DC

## 2013-12-05 MED ORDER — PROPOFOL 10 MG/ML IV BOLUS
INTRAVENOUS | Status: AC | PRN
  Administered 2013-12-05 (×6): 20 mg via INTRAVENOUS

## 2013-12-05 MED ORDER — ESTROGENS CONJUGATED 0.3 MG PO TABS
0.3000 mg | ORAL_TABLET | Freq: Every day | ORAL | Status: DC
  Filled 2013-12-05: qty 1

## 2013-12-05 MED ORDER — LIDOCAINE HCL (CARDIAC) 20 MG/ML IV SOLN
INTRAVENOUS | Status: DC | PRN
  Administered 2013-12-05: 75 mg via INTRAVENOUS

## 2013-12-05 MED ORDER — SODIUM CHLORIDE 0.9 % IV SOLN: INTRAVENOUS | Status: DC

## 2013-12-05 MED ORDER — TRAMADOL HCL 50 MG PO TABS: 50.0000 mg | ORAL_TABLET | Freq: Four times a day (QID) | ORAL | Status: DC | PRN

## 2013-12-05 MED ORDER — PROPOFOL 10 MG/ML IV BOLUS
INTRAVENOUS | Status: DC | PRN
  Administered 2013-12-05: 175 mg via INTRAVENOUS
  Administered 2013-12-05: 25 mg via INTRAVENOUS

## 2013-12-05 MED ORDER — PROPOFOL 10 MG/ML IV BOLUS
60.0000 mg | Freq: Once | INTRAVENOUS | Status: AC
  Administered 2013-12-05: 60 mg via INTRAVENOUS
  Filled 2013-12-05: qty 1

## 2013-12-05 MED ORDER — FENTANYL CITRATE 0.05 MG/ML IJ SOLN
INTRAMUSCULAR | Status: DC | PRN
  Administered 2013-12-05 (×2): 50 ug via INTRAVENOUS

## 2013-12-05 MED ORDER — SODIUM CHLORIDE 0.9 % IV BOLUS (SEPSIS)
1000.0000 mL | Freq: Once | INTRAVENOUS | Status: AC
  Administered 2013-12-05: 1000 mL via INTRAVENOUS

## 2013-12-05 MED ORDER — DILTIAZEM HCL ER COATED BEADS 240 MG PO CP24
240.0000 mg | ORAL_CAPSULE | Freq: Every day | ORAL | Status: DC
  Filled 2013-12-05: qty 1

## 2013-12-05 MED ORDER — CLOBETASOL PROPIONATE 0.05 % EX CREA: 1.0000 "application " | TOPICAL_CREAM | Freq: Two times a day (BID) | CUTANEOUS | Status: DC | PRN

## 2013-12-05 MED ORDER — CARVEDILOL 6.25 MG PO TABS
6.2500 mg | ORAL_TABLET | Freq: Every day | ORAL | Status: DC
  Filled 2013-12-05: qty 1

## 2013-12-05 MED ORDER — POLYVINYL ALCOHOL 1.4 % OP SOLN
1.0000 [drp] | Freq: Two times a day (BID) | OPHTHALMIC | Status: DC | PRN
  Filled 2013-12-05: qty 15

## 2013-12-05 MED ORDER — FENTANYL CITRATE 0.05 MG/ML IJ SOLN
INTRAMUSCULAR | Status: AC
  Filled 2013-12-05: qty 2

## 2013-12-05 MED ORDER — ONDANSETRON HCL 4 MG/2ML IJ SOLN
INTRAMUSCULAR | Status: DC | PRN
  Administered 2013-12-05 (×2): 2 mg via INTRAVENOUS

## 2013-12-05 MED ORDER — ONDANSETRON HCL 4 MG/2ML IJ SOLN
4.0000 mg | Freq: Four times a day (QID) | INTRAMUSCULAR | Status: DC | PRN
  Administered 2013-12-05: 4 mg via INTRAVENOUS
  Filled 2013-12-05: qty 2

## 2013-12-05 MED ORDER — LACTATED RINGERS IV SOLN
INTRAVENOUS | Status: DC | PRN
  Administered 2013-12-05: 18:00:00 via INTRAVENOUS

## 2013-12-05 MED ORDER — PANTOPRAZOLE SODIUM 40 MG PO TBEC: 40.0000 mg | DELAYED_RELEASE_TABLET | Freq: Every day | ORAL | Status: DC

## 2013-12-05 SURGICAL SUPPLY — 22 items
BANDAGE ADH SHEER 1  50/CT (GAUZE/BANDAGES/DRESSINGS) IMPLANT
BANDAGE ELASTIC 6 VELCRO ST LF (GAUZE/BANDAGES/DRESSINGS) ×2 IMPLANT
DRSG KUZMA FLUFF (GAUZE/BANDAGES/DRESSINGS) ×1 IMPLANT
DRSG TEGADERM 8X12 (GAUZE/BANDAGES/DRESSINGS) ×2 IMPLANT
DURAPREP 26ML APPLICATOR (WOUND CARE) ×1 IMPLANT
GAUZE SPONGE 4X4 12PLY STRL (GAUZE/BANDAGES/DRESSINGS) IMPLANT
GLOVE BIOGEL PI IND STRL 7.5 (GLOVE) IMPLANT
GLOVE BIOGEL PI IND STRL 8.5 (GLOVE) ×1 IMPLANT
GLOVE BIOGEL PI INDICATOR 7.5 (GLOVE)
GLOVE BIOGEL PI INDICATOR 8.5 (GLOVE)
GLOVE ECLIPSE 8.0 STRL XLNG CF (GLOVE) IMPLANT
GLOVE ORTHO TXT STRL SZ7.5 (GLOVE) IMPLANT
GLOVE SURG ORTHO 8.0 STRL STRW (GLOVE) ×1 IMPLANT
GOWN SPEC L3 XXLG W/TWL (GOWN DISPOSABLE) ×2 IMPLANT
GOWN STRL REUS W/TWL LRG LVL3 (GOWN DISPOSABLE) IMPLANT
IMMOBILIZER KNEE 20 (SOFTGOODS) ×2 IMPLANT
MANIFOLD NEPTUNE II (INSTRUMENTS) ×1 IMPLANT
NDL SAFETY ECLIPSE 18X1.5 (NEEDLE) IMPLANT
NEEDLE HYPO 18GX1.5 SHARP (NEEDLE)
POSITIONER SURGICAL ARM (MISCELLANEOUS) ×1 IMPLANT
SYR CONTROL 10ML LL (SYRINGE) IMPLANT
TOWEL OR 17X26 10 PK STRL BLUE (TOWEL DISPOSABLE) ×2 IMPLANT

## 2013-12-05 NOTE — ED Provider Notes (Signed)
CSN: [id number]     Arrival date & time 12/05/13  1125 History   First MD Initiated Contact with Patient 12/05/13 1143     Chief Complaint  Patient presents with  . Hip Injury     (Consider location/radiation/quality/duration/timing/severity/associated sxs/prior Treatment) HPI  80 year old female with history of total hip replacement and prior history of hip dislocation presents via EMS for evaluation of suspected left hip dislocation. Patient report approximately 1 hour ago she was finished with showering. She propped her left leg on top of a commode to dry herself.  when she let her left leg down, her hip dislocated. She had a controlled fall and denies injuring any body parts. Her husband was able to come to her aid and called EMS. She reports her pain is minimal and rate is 4/10 at this time. Described as intermittent cramping to her left hip, nonradiating. Denies low back pain, knee or ankle pain. Denies hitting head or loss of consciousness. Denies any new numbness. She reports dislocating her hip from basically the same movement about a year ago. She usually has her hip relocated in the OR.  L hip replacement in 2010 by Dr. Wynelle Link  Past Medical History  Diagnosis Date  . Pericarditis     RECURRENT   . Hypertension   . Thoracic aneurysm     "STABLE ANEURYSMAL DISEASE OF THE ASCENDING THORACIC AORTA WITH MAXIMAL DIAMETER OF 4.6" PER MRI CHEST 12/13/11 -AND LAST OFFICE NOTE DR. BARTLE IN [facility] 12/14/2011  . Heart murmur     "MILD AORTIC INSUFFICIENCY" PER CARDIOLOGY OFFICE NOTE DR. Linard Millers 12/01/11  . Anemia     CHRONIC  . Dysrhythmia     HX OF PAROXYSMAL SUPRAVENTRICULAR TACHYCARDIA ONCE - PT STATES NO PROBLEM WITH RE-OCCURANCE  . Chronic kidney disease     PT TOLD "MILD KIDNEY DISEASE FROM ALL THE DYE USED FOR XRAYS"  . GERD (gastroesophageal reflux disease)   . Cancer     SKIN CANCERS REMOVED FROM LEGS  . Arthritis     OA-SOME BACK AND NECK PAIN,  GOES TO CHIROPRACTOR TWICE A  MONTH;  HX OF JOINT REPLACEMENTS   . Anemia requiring transfusions     POST OPERATIVELY   Past Surgical History  Procedure Laterality Date  . Appendectomy    . Total knee arthroplasty      right  . Revision total hip arthroplasty      left  . Vaginal hysterectomy      with ovarian cyst removal  . Pelvic laparoscopy      ovarian cyst removal,   . Oophorectomy      has a partial of one ovary remaingin  . Right achilles tendon repair  5/13  . Total knee arthroplasty      left  . Joint replacement      LEFT TOTAL HIP REPLACEMENT AND REVISIONS X 2  . Excision/release bursa hip Left 11/24/2012    Procedure: LEFT HIP BURSECTOMY AND TENDON REPAIR ;  Surgeon: Gearlean Alf, MD;  Location: [facility];  Service: Orthopedics;  Laterality: Left;  . Hip closed reduction Left 12/19/2012    Procedure: CLOSED MANIPULATION HIP;  Surgeon: Mauri Pole, MD;  Location: [facility];  Service: Orthopedics;  Laterality: Left;   Family History  Problem Relation Age of Onset  . Hypertension Mother   . Stroke Mother   . Heart attack Father 45   History  Substance Use Topics  . Smoking status: Former Smoker  Types: Cigarettes  . Smokeless tobacco: Never Used     Comment: quit age 32  . Alcohol Use: No   OB History   Grav Para Term Preterm Abortions TAB SAB Ect Mult Living   2 2        2      Review of Systems  Constitutional: Negative for fever.  Musculoskeletal: Positive for arthralgias.  Skin: Negative for rash and wound.  Neurological: Negative for numbness.      Allergies  Clindamycin/lincomycin; Cephalexin; Ciprofloxacin; Shellfish allergy; Sulfa antibiotics; and Sulfites  Home Medications   Prior to Admission medications   Medication Sig Start Date End Date Taking? Authorizing Provider  amoxicillin (AMOXIL) 500 MG capsule Take 500 mg by mouth daily.  08/21/13   Historical Provider, MD  aspirin 81 MG tablet Take 81 mg by mouth daily.    Historical Provider, MD  Calcium Carbonate-Vit  D-Min (CALCIUM 1200 PO) Take 1 tablet by mouth daily.     Historical Provider, MD  carboxymethylcellulose (REFRESH TEARS) 0.5 % SOLN Place 1 drop into both eyes 2 (two) times daily as needed (dry eyes).    Historical Provider, MD  carvedilol (COREG) 12.5 MG tablet Take 6.25-12.5 mg by mouth 2 (two) times daily. Takes 1/2 tablet in am and 1 tablet at night    Historical Provider, MD  cholecalciferol (VITAMIN D) 1000 UNITS tablet Take 1,000 Units by mouth daily.    Historical Provider, MD  clobetasol cream (TEMOVATE) 9.50 % Apply 1 application topically 2 (two) times daily.  08/08/13   Historical Provider, MD  diltiazem (CARDIZEM CD) 240 MG 24 hr capsule Take 1 capsule (240 mg total) by mouth daily. 03/02/13   Belva Crome III, MD  estrogens, conjugated, (PREMARIN) 0.3 MG tablet Take 1 tablet (0.3 mg total) by mouth daily. 01/23/13   Anastasio Auerbach, MD  fish oil-omega-3 fatty acids 1000 MG capsule Take 2 g by mouth daily.    Historical Provider, MD  meloxicam (MOBIC) 7.5 MG tablet Take 7.5 mg by mouth daily as needed for pain.     Historical Provider, MD  omeprazole (PRILOSEC) 20 MG capsule Take 20 mg by mouth daily.    Historical Provider, MD  valsartan (DIOVAN) 160 MG tablet Take 1 tablet (160 mg total) by mouth 2 (two) times daily. 11/28/13   Scott T Weaver, PA-C   BP 174/67  Pulse 60  Temp(Src) 97.5 F (36.4 C) (Oral)  Resp 16  Ht 5\' 4"  (1.626 m)  Wt 158 lb (71.668 kg)  BMI 27.11 kg/m2  SpO2 97% Physical Exam  Nursing note and vitals reviewed. Constitutional: She appears well-developed and well-nourished. No distress.  HENT:  Head: Atraumatic.  Eyes: Conjunctivae are normal.  Neck: Neck supple.  Musculoskeletal: She exhibits tenderness (Tenderness throughout left hip on palpation. Decreased range of motion secondary to pain. Left leg is shortened and internally rotated. No overlying skin changes.).  Neurological: She is alert.  Pedal pulse palpable on exam, brisk cap refill.  Sensation is intact throughout major nerve distribution on left leg.  Skin: No rash noted.  Psychiatric: She has a normal mood and affect.    ED Course  Procedures (including critical care time)  11:58 AM Patient with left hip dislocation. She is NVI.   X-ray ordered to confirm. Pain medication offer but patient declined at this time. Plan to consult orthopedic if dislocated.  Care discussed with Dr. Leonides Schanz.   1:55 PM Dr. Leonides Schanz and I have reviewed the xray, which  demonstrates a superior dislocation of L femoral component from acetabular cup.  We discussed that finding with pt, pt request for reduction in ER instead of OR.  We discussed risk and benefit including neurovascular injury, unsuccessful attempts, increasing pain.  Pt agrees with plan and elected to proceed.  Pt with procedural sedation.  Multiple attempts of reduction by me and Dr. Leonides Schanz without success.  Pt suffered skin tear to posterior lower leg from attempt.  Reduction was unsuccessful.  Will consult ortho on call for further care.  Will continue close monitoring.  Pt made aware of skin tear.  She expressed understanding.    2:56 PM Multiple attempts to get in touch with ortho without success.  Will continue trying.  Pain medication provided to pt.  Pt made aware of progression.    2:58 PM I have consulted with orthopedist, Dr. Veverly Fells, who agrees to see pt in ER and will determine plan.    Labs Review Labs Reviewed  CBC WITH DIFFERENTIAL - Abnormal; Notable for the following:    RBC 3.27 (*)    Hemoglobin 9.3 (*)    HCT 27.7 (*)    All other components within normal limits  BASIC METABOLIC PANEL - Abnormal; Notable for the following:    Sodium 136 (*)    Calcium 8.3 (*)    GFR calc non Af Amer 53 (*)    GFR calc Af Amer 61 (*)    All other components within normal limits    Imaging Review Dg Hip Complete Left  12/05/2013   CLINICAL DATA:  Slipped while getting out of tub, felt a pop, pain and limited movement  question dislocation  EXAM: LEFT HIP - COMPLETE 2+ VIEW  COMPARISON:  12/19/2012  FINDINGS: Acetabular and femoral components of a LEFT hip prosthesis again identified.  Superior dislocation of LEFT femoral component from acetabular cup.  Osseous mineralization normal.  No fractures seen.  IMPRESSION: Dislocated LEFT hip prosthesis.   Electronically Signed   By: Lavonia Dana M.D.   On: 12/05/2013 12:49     EKG Interpretation None      MDM   Final diagnoses:  Hip dislocation, left, initial encounter    BP 160/66  Pulse 59  Temp(Src) 97.5 F (36.4 C) (Oral)  Resp 11  Ht 5\' 4"  (1.626 m)  Wt 158 lb (71.668 kg)  BMI 27.11 kg/m2  SpO2 98%  I have reviewed nursing notes and vital signs. I personally reviewed the imaging tests through PACS system  I reviewed available ER/hospitalization records thought the EMR     Domenic Moras, [place] 12/05/13 1552

## 2013-12-05 NOTE — ED Notes (Signed)
Pt O2 saturation on 2 lpm [place] dropped to 83%. 3 breaths given via ambu bag per [facility] MD. Pt able to breathe freely post 3 breaths given. O2 saturation 98% on 2lpm [place].

## 2013-12-05 NOTE — ED Notes (Addendum)
Pt states "have they already tried yet." Pt reports pain the same prior to procedure. Skin tear to posterior left upper calf during procedure; attempt to relocate left hip. Gauze and tube gauze applied to area.

## 2013-12-05 NOTE — ED Notes (Signed)
Pt to xray

## 2013-12-05 NOTE — H&P (Signed)
[name] is an 80 y.o. female.    Chief Complaint: left hip pain  HPI: 80 y/o female with a hx of left hip dislocation x 3 in the past with 2 past revisions of left hip presents with left hip pain, internal rotation and shortening. Denies any fall or injury, hip just 'popped out again'. Denies any other injuries or issues. Otherwise doing well  PCP:  Kandice Hams, MD  PMH: Past Medical History  Diagnosis Date  . Pericarditis     RECURRENT   . Hypertension   . Thoracic aneurysm     "STABLE ANEURYSMAL DISEASE OF THE ASCENDING THORACIC AORTA WITH MAXIMAL DIAMETER OF 4.6" PER MRI CHEST 12/13/11 -AND LAST OFFICE NOTE DR. BARTLE IN [facility] 12/14/2011  . Heart murmur     "MILD AORTIC INSUFFICIENCY" PER CARDIOLOGY OFFICE NOTE DR. Linard Millers 12/01/11  . Anemia     CHRONIC  . Dysrhythmia     HX OF PAROXYSMAL SUPRAVENTRICULAR TACHYCARDIA ONCE - PT STATES NO PROBLEM WITH RE-OCCURANCE  . Chronic kidney disease     PT TOLD "MILD KIDNEY DISEASE FROM ALL THE DYE USED FOR XRAYS"  . GERD (gastroesophageal reflux disease)   . Cancer     SKIN CANCERS REMOVED FROM LEGS  . Arthritis     OA-SOME BACK AND NECK PAIN,  GOES TO CHIROPRACTOR TWICE A MONTH;  HX OF JOINT REPLACEMENTS   . Anemia requiring transfusions     POST OPERATIVELY    PSH: Past Surgical History  Procedure Laterality Date  . Appendectomy    . Total knee arthroplasty      right  . Revision total hip arthroplasty      left  . Vaginal hysterectomy      with ovarian cyst removal  . Pelvic laparoscopy      ovarian cyst removal,   . Oophorectomy      has a partial of one ovary remaingin  . Right achilles tendon repair  5/13  . Total knee arthroplasty      left  . Joint replacement      LEFT TOTAL HIP REPLACEMENT AND REVISIONS X 2  . Excision/release bursa hip Left 11/24/2012    Procedure: LEFT HIP BURSECTOMY AND TENDON REPAIR ;  Surgeon: Gearlean Alf, MD;  Location: [facility];  Service: Orthopedics;  Laterality: Left;  .  Hip closed reduction Left 12/19/2012    Procedure: CLOSED MANIPULATION HIP;  Surgeon: Mauri Pole, MD;  Location: [facility];  Service: Orthopedics;  Laterality: Left;    Social History:  reports that she has quit smoking. Her smoking use included Cigarettes. She smoked 0.00 packs per day. She has never used smokeless tobacco. She reports that she does not drink alcohol or use illicit drugs.  Allergies:  Allergies  Allergen Reactions  . Clindamycin/Lincomycin Rash  . Cephalexin     Stroke symptoms  . Ciprofloxacin     RASH  . Shellfish Allergy Hives  . Sulfa Antibiotics     Unknown   . Sulfites     Medications: Current Facility-Administered Medications  Medication Dose Route Frequency Provider Last Rate Last Dose  . fentaNYL (SUBLIMAZE) injection 50 mcg  50 mcg Intravenous Once Domenic Moras, PA-C       Current Outpatient Prescriptions  Medication Sig Dispense Refill  . aspirin 81 MG tablet Take 81 mg by mouth daily.      . Calcium Carbonate-Vit D-Min (CALCIUM 1200 PO) Take 1 tablet by mouth daily.       Marland Kitchen  carboxymethylcellulose (REFRESH TEARS) 0.5 % SOLN Place 1 drop into both eyes 2 (two) times daily as needed (dry eyes).      . carvedilol (COREG) 12.5 MG tablet Take 6.25-12.5 mg by mouth 2 (two) times daily. Takes 1/2 tablet in am and 1 tablet at night      . cholecalciferol (VITAMIN D) 1000 UNITS tablet Take 1,000 Units by mouth daily.      . clobetasol cream (TEMOVATE) 2.94 % Apply 1 application topically 2 (two) times daily as needed (itching).       Marland Kitchen diltiazem (CARDIZEM CD) 240 MG 24 hr capsule Take 1 capsule (240 mg total) by mouth daily.  90 capsule  3  . estrogens, conjugated, (PREMARIN) 0.3 MG tablet Take 1 tablet (0.3 mg total) by mouth daily.  90 tablet  4  . fish oil-omega-3 fatty acids 1000 MG capsule Take 2 g by mouth daily.      Marland Kitchen omeprazole (PRILOSEC) 20 MG capsule Take 20 mg by mouth daily.      . valsartan (DIOVAN) 160 MG tablet Take 1 tablet (160 mg total) by  mouth 2 (two) times daily.  60 tablet  11    Results for orders placed during the hospital encounter of 12/05/13 (from the past 48 hour(s))  CBC WITH DIFFERENTIAL     Status: Abnormal   Collection Time    12/05/13  2:50 PM      Result Value Ref Range   WBC 8.4  4.0 - 10.5 K/uL   RBC 3.27 (*) 3.87 - 5.11 MIL/uL   Hemoglobin 9.3 (*) 12.0 - 15.0 g/dL   HCT 27.7 (*) 36.0 - 46.0 %   MCV 84.7  78.0 - 100.0 fL   MCH 28.4  26.0 - 34.0 pg   MCHC 33.6  30.0 - 36.0 g/dL   RDW 14.1  11.5 - 15.5 %   Platelets 198  150 - 400 K/uL   Neutrophils Relative % 69  43 - 77 %   Neutro Abs 5.8  1.7 - 7.7 K/uL   Lymphocytes Relative 18  12 - 46 %   Lymphs Abs 1.5  0.7 - 4.0 K/uL   Monocytes Relative 9  3 - 12 %   Monocytes Absolute 0.7  0.1 - 1.0 K/uL   Eosinophils Relative 3  0 - 5 %   Eosinophils Absolute 0.3  0.0 - 0.7 K/uL   Basophils Relative 1  0 - 1 %   Basophils Absolute 0.1  0.0 - 0.1 K/uL   Dg Hip Complete Left  12/05/2013   CLINICAL DATA:  Slipped while getting out of tub, felt a pop, pain and limited movement question dislocation  EXAM: LEFT HIP - COMPLETE 2+ VIEW  COMPARISON:  12/19/2012  FINDINGS: Acetabular and femoral components of a LEFT hip prosthesis again identified.  Superior dislocation of LEFT femoral component from acetabular cup.  Osseous mineralization normal.  No fractures seen.  IMPRESSION: Dislocated LEFT hip prosthesis.   Electronically Signed   By: Lavonia Dana M.D.   On: 12/05/2013 12:49    ROS: ROS Unable to ambulate  Hx of previous dislocation x 3  Physical Exam: Alert and appropriate 80 y/o female in no acute distress Left lower extremity with shortening and internal rotation nv intact distally Skin tear to left leg from attempted closed reduction Mild edema to pretibial area and feet bilaterally No rashes  Physical Exam   X-rays: left hip dislocation  Assessment/Plan Assessment: left hip dislocation  Plan: Will plan to admit for closed reduction of  left hip later today Possible d/c home after procedure vs admission for discussion of another revision Pain control as needed

## 2013-12-05 NOTE — ED Notes (Signed)
O2 discontinued.

## 2013-12-05 NOTE — ED Notes (Signed)
Bed: [facility] Expected date:  Expected time:  Means of arrival:  Comments: EMS

## 2013-12-05 NOTE — ED Notes (Signed)
Total of 200 mg of propofol given during sedation.

## 2013-12-05 NOTE — ED Notes (Signed)
ERROR in documentation aldrete scale IS A 10 NOT A 6.

## 2013-12-05 NOTE — ED Provider Notes (Signed)
Medical screening examination/treatment/procedure(s) were conducted as a shared visit with non-physician practitioner(s) and myself.  I personally evaluated the patient during the encounter.   EKG Interpretation None       Pt is a 80 y.o. F with history of left total hip arthroplasty approximately 5 years ago by Dr. Maureen Ralphs  With subsequent revision and then a gluteal tendon tear repair and left hip bursectomy in 2014 who presents to the emergency department with a superior, anterior dislocation of the left hip arthroplasty. She is neurovascular intact distally. She states this occurred when she propped her leg up on the toilet to dry herself off after she took a shower. Attempted to reduce patient's dislocated hip in the ED without success. She did develop a subsequent skin tear from manual attempts at reduction to her popliteal fossa of the left leg. We have cleaned and wrapped in this area. Discussed with patient that this was one of the risks with attempting reduction in the ED. She verbalized understanding. Tetanus is up-to-date. Discussed with Dr. Veverly Fells with orthopedics.  Orthopedics to admit patient.     Procedural sedation Performed by: Nyra Jabs Consent: Verbal consent obtained. Risks and benefits: risks, benefits and alternatives were discussed Required items: required blood products, implants, devices, and special equipment available Patient identity confirmed: arm band and provided demographic data Time out: Immediately prior to procedure a "time out" was called to verify the correct patient, procedure, equipment, support staff and site/side marked as required.  Sedation type: moderate (conscious) sedation NPO time confirmed and considedered  Sedatives: PROPOFOL - 200mg  IV  Physician Time at Bedside: Sedation Start 1318, Medication given 1337, Sedation end 1350  Vitals: Vital signs were monitored during sedation. Cardiac Monitor, pulse oximeter Patient tolerance:  Patient tolerated the procedure well with no immediate complications. Comments: Pt with uneventful recovered. Returned to pre-procedural sedation baseline     Reduction of dislocation Date/Time: 13:37 Performed by: Nyra Jabs Authorized by: Nyra Jabs Consent: Verbal consent obtained. Risks and benefits: risks, benefits and alternatives were discussed Consent given by: patient Required items: required blood products, implants, devices, and special equipment available Time out: Immediately prior to procedure a "time out" was called to verify the correct patient, procedure, equipment, support staff and site/side marked as required.  Patient sedated: Using propofol  Vitals: Vital signs were monitored during sedation. Patient tolerance: Patient tolerated the procedure well with no immediate complications. Joint: Left hip Reduction technique: Traction Unsuccessful reduction of patient's left hip. We'll contact orthopedics.     Forbes, DO 12/05/13 [phone]

## 2013-12-05 NOTE — ED Notes (Signed)
Bed: WA08 Expected date:  Expected time:  Means of arrival:  Comments: EMS 

## 2013-12-05 NOTE — ED Notes (Signed)
OR PA at bedside.

## 2013-12-05 NOTE — Brief Op Note (Signed)
12/05/2013  6:15 PM  PATIENT:  [name]  80 y.o. female  PRE-OPERATIVE DIAGNOSIS:  LEFT HIP DISLOCATION  POST-OPERATIVE DIAGNOSIS:  LEFT HIP DISLOCATION, POSTERIOR  PROCEDURE:  Procedure(s): CLOSED MANIPULATION HIP (Left)  SURGEON:  Surgeon(s) and Role:    * Augustin Schooling, MD - Primary    * Gearlean Alf, MD - Assisting  PHYSICIAN ASSISTANT: Doren Custard, PA-C  ASSISTANTS: Alusio MD   ANESTHESIA:   general  EBL:   none  BLOOD ADMINISTERED:none  DRAINS: none   LOCAL MEDICATIONS USED:  NONE  SPECIMEN:  No Specimen  DISPOSITION OF SPECIMEN:  N/A  COUNTS:  YES  TOURNIQUET:  * No tourniquets in log *  DICTATION: .Other Dictation: Dictation Number [id number]  PLAN OF CARE: Discharge to home after PACU  PATIENT DISPOSITION:  PACU - hemodynamically stable.   Delay start of Pharmacological VTE agent (>24hrs) due to surgical blood loss or risk of bleeding: not applicable

## 2013-12-05 NOTE — Anesthesia Preprocedure Evaluation (Addendum)
Anesthesia Evaluation  Patient identified by MRN, date of birth, ID band Patient awake    Reviewed: Allergy & Precautions, H&P , NPO status , Patient's Chart, lab work & pertinent test results  Airway Mallampati: II TM Distance: >3 FB Neck ROM: Full    Dental no notable dental hx.    Pulmonary former smoker,  breath sounds clear to auscultation  Pulmonary exam normal       Cardiovascular Exercise Tolerance: Good hypertension, Pt. on home beta blockers and Pt. on medications + Peripheral Vascular Disease + dysrhythmias + Valvular Problems/Murmurs Rhythm:Regular Rate:Normal     Neuro/Psych negative neurological ROS  negative psych ROS   GI/Hepatic Neg liver ROS, GERD-  Medicated,  Endo/Other  negative endocrine ROS  Renal/GU Renal InsufficiencyRenal disease  negative genitourinary   Musculoskeletal negative musculoskeletal ROS (+) Arthritis -,   Abdominal   Peds negative pediatric ROS (+)  Hematology  (+) anemia ,   Anesthesia Other Findings   Reproductive/Obstetrics negative OB ROS                          Anesthesia Physical Anesthesia Plan  ASA: III  Anesthesia Plan: General   Post-op Pain Management:    Induction: Intravenous  Airway Management Planned: LMA  Additional Equipment:   Intra-op Plan:   Post-operative Plan: Extubation in OR  Informed Consent: I have reviewed the patients History and Physical, chart, labs and discussed the procedure including the risks, benefits and alternatives for the proposed anesthesia with the patient or authorized representative who has indicated his/her understanding and acceptance.   Dental advisory given  Plan Discussed with: CRNA  Anesthesia Plan Comments:         Anesthesia Quick Evaluation

## 2013-12-05 NOTE — Progress Notes (Signed)
Dr Christian Mate aware pt wants to take home medication for BP.  BP 175/79. Dr Deirdre Priest authorized pt to take medication

## 2013-12-05 NOTE — Anesthesia Procedure Notes (Signed)
Procedure Name: LMA Insertion Date/Time: 12/05/2013 5:52 PM Performed by: Ofilia Neas Pre-anesthesia Checklist: Patient identified, Timeout performed, Emergency Drugs available, Suction available and Patient being monitored Patient Re-evaluated:Patient Re-evaluated prior to inductionOxygen Delivery Method: Circle system utilized Preoxygenation: Pre-oxygenation with 100% oxygen Intubation Type: IV induction LMA: LMA inserted LMA Size: 4.0 Number of attempts: 1 Placement Confirmation: positive ETCO2 Tube secured with: Tape Dental Injury: Teeth and Oropharynx as per pre-operative assessment

## 2013-12-05 NOTE — ED Notes (Signed)
First medication administered at this time for sedation.

## 2013-12-05 NOTE — Anesthesia Postprocedure Evaluation (Signed)
  Anesthesia Post-op Note  Patient: [name]  Procedure(s) Performed: Procedure(s) (LRB): CLOSED MANIPULATION HIP (Left)  Patient Location: PACU  Anesthesia Type: General  Level of Consciousness: awake and alert   Airway and Oxygen Therapy: Patient Spontanous Breathing  Post-op Pain: mild  Post-op Assessment: Post-op Vital signs reviewed, Patient's Cardiovascular Status Stable, Respiratory Function Stable, Patent Airway and No signs of Nausea or vomiting  Last Vitals:  Filed Vitals:   12/05/13 1845  BP: 137/73  Pulse: 70  Temp:   Resp: 19    Post-op Vital Signs: stable   Complications: No apparent anesthesia complications

## 2013-12-05 NOTE — ED Notes (Addendum)
2 lpm [place] O2 applied prior to medication administration for sedation.

## 2013-12-05 NOTE — ED Notes (Signed)
Pt reports does not want something for pain. Will alert staff if would like something for pain.

## 2013-12-05 NOTE — Interval H&P Note (Signed)
History and Physical Interval Note:  12/05/2013 5:32 PM  [name]  has presented today for surgery, with the diagnosis of LEFT HIP DISLOCATION  The various methods of treatment have been discussed with the patient and family. After consideration of risks, benefits and other options for treatment, the patient has consented to  Procedure(s): [place] (Left) as a surgical intervention .  The patient's history has been reviewed, patient examined, no change in status, stable for surgery.  I have reviewed the patient's chart and labs.  Questions were answered to the patient's satisfaction.     Asherah Lavoy,STEVEN R

## 2013-12-05 NOTE — Transfer of Care (Signed)
Immediate Anesthesia Transfer of Care Note  Patient: [name]  Procedure(s) Performed: Procedure(s): CLOSED MANIPULATION HIP (Left)  Patient Location: PACU  Anesthesia Type:General  Level of Consciousness: awake, alert , oriented and patient cooperative  Airway & Oxygen Therapy: Patient Spontanous Breathing and Patient connected to face mask oxygen  Post-op Assessment: Report given to PACU RN, Post -op Vital signs reviewed and stable and Patient moving all extremities  Post vital signs: Reviewed and stable  Complications: No apparent anesthesia complications

## 2013-12-05 NOTE — ED Notes (Signed)
Per EMS pt had left leg propped on toilet and dislocated hip when placing it onto the ground. Pt has had hip dislocation in the past. Per EMS minimal pain 4/10 with intermittent cramping to left hip.

## 2013-12-05 NOTE — ED Notes (Signed)
Pt to OR.

## 2013-12-05 NOTE — ED Notes (Signed)
Pt responds to voice. Eye movements spontaneous.

## 2013-12-06 NOTE — Op Note (Signed)
[name], [name]             ACCOUNT NO.:  [id number]  MEDICAL RECORD NO.:  [id number]  LOCATION:  [facility]                         FACILITY:  [facility]  PHYSICIAN:  Doran Heater. Veverly Fells, M.D. DATE OF BIRTH:  1929/05/03  DATE OF PROCEDURE:  12/05/2013 DATE OF DISCHARGE:  12/05/2013                              OPERATIVE REPORT   PREOPERATIVE DIAGNOSIS:  Left closed posterior dislocation of total hip.  POSTOPERATIVE DIAGNOSIS:  Left closed posterior dislocation of total hip.  PROCEDURE PERFORMED:  Closed manipulation and reduction of left total hip arthroplasty.  ATTENDING SURGEON:  Doran Heater. Veverly Fells, M.D.  ASSISTANT:  Gaynelle Arabian, M.D.  ANESTHESIA:  General anesthesia was used.  ESTIMATED BLOOD LOSS:  None.  FLUID REPLACEMENT:  500 mL crystalloid.  INSTRUMENT COUNTS:  Correct.  COMPLICATIONS:  There were no complications.  ANTIBIOTICS:  Antibiotics were not needed.  INDICATIONS:  The patient is an 80 year old female who suffered a closed dislocation of her left hip when she placed her leg up on a toilet to dry her leg today.  The patient has had history of prior dislocation including revision surgery for her hip.  She has been doing well for the past year until this unfortunate incident in the bathroom today.  The patient was unable to stand after her injury and presented with an obvious deformity to her left leg with internal rotation, flexion and a shortened extremity.  X-rays demonstrated no periprosthetic fracture. She had a posterior hip dislocation.  She was brought to the operating room for closed reduction.  Risks and benefits of procedure were discussed and informed consent obtained.  DESCRIPTION OF PROCEDURE:  After an adequate level of anesthesia achieved, the patient was stabilized on the operating room stretcher. She was then internally rotated and the hip was relocated with some difficulty.  Once the hip was in, it was stable and we were able to get a flat  plate x-ray in the operating room, demonstrating concentric reduction of her hip.  No periprosthetic fracture or complication.  The patient did have a skin tear of her leg that was occurred in the emergency department when an attempted reduction was performed by emergency department personnel.  This was washed thoroughly with CHX prep and then a Tegaderm bio-occlusive dressing was placed over that skin tear and a well-padded bandage was placed to protect that and a knee immobilizer was applied.  The patient was transported, extubated in stable condition to recovery room having tolerated the procedure well.     Doran Heater. Veverly Fells, M.D.     SRN/MEDQ  D:  12/05/2013  T:  12/06/2013  Job:  [id number]

## 2013-12-07 ENCOUNTER — Encounter (HOSPITAL_COMMUNITY): Payer: Self-pay | Admitting: Orthopedic Surgery

## 2013-12-08 ENCOUNTER — Encounter (HOSPITAL_COMMUNITY): Payer: Self-pay | Admitting: Emergency Medicine

## 2013-12-08 ENCOUNTER — Emergency Department (INDEPENDENT_AMBULATORY_CARE_PROVIDER_SITE_OTHER)
Admission: EM | Admit: 2013-12-08 | Discharge: 2013-12-08 | Disposition: A | Discharge status: Home / Self Care | Payer: Medicare Other | Source: Home / Self Care | Attending: Family Medicine | Admitting: Family Medicine

## 2013-12-08 DIAGNOSIS — S81809A Unspecified open wound, unspecified lower leg, initial encounter: Secondary | ICD-10-CM | POA: Diagnosis not present

## 2013-12-08 DIAGNOSIS — S81009A Unspecified open wound, unspecified knee, initial encounter: Secondary | ICD-10-CM | POA: Diagnosis not present

## 2013-12-08 DIAGNOSIS — S91009A Unspecified open wound, unspecified ankle, initial encounter: Secondary | ICD-10-CM | POA: Diagnosis not present

## 2013-12-08 DIAGNOSIS — S81819D Laceration without foreign body, unspecified lower leg, subsequent encounter: Secondary | ICD-10-CM

## 2013-12-08 MED ORDER — BACITRACIN ZINC 500 UNIT/GM EX OINT
TOPICAL_OINTMENT | CUTANEOUS | Status: AC
  Filled 2013-12-08: qty 4.5

## 2013-12-08 MED ORDER — MUPIROCIN 2 % EX OINT: 1.0000 "application " | TOPICAL_OINTMENT | Freq: Two times a day (BID) | CUTANEOUS | Status: DC

## 2013-12-08 NOTE — ED Provider Notes (Signed)
Medical screening examination/treatment/procedure(s) were performed by non-physician practitioner and as supervising physician I was immediately available for consultation/collaboration.  Philipp Deputy, M.D.  Harden Mo, MD 12/08/13 2004

## 2013-12-08 NOTE — ED Notes (Signed)
Pt   Here      For  A   Wound  Check         She   Was  Seen   In  Er   3  Days  Ago  And  Had  A  Hip  Relocation    And    A  Skin  Tear  Resulted      As  Well on the  Back  Of  Her     L  Leg     She   Has tegaderm in place  And  Is  In a  Knee  Immobilizer     She  Has  Some  Swelling  And  Bruising  To the  Affected  Area          She  Did  However    Drive  Herself to  The  [facility]

## 2013-12-08 NOTE — Discharge Instructions (Signed)
Skin Tear Care  A skin tear is a wound in which the top layer of skin has peeled off. This is a common problem with aging because the skin becomes thinner and more fragile as a person gets older. In addition, some medicines, such as oral corticosteroids, can lead to skin thinning if taken for long periods of time.   A skin tear is often repaired with tape or skin adhesive strips. This keeps the skin that has been peeled off in contact with the healthier skin beneath. Depending on the location of the wound, a bandage (dressing) may be applied over the tape or skin adhesive strips. Sometimes, during the healing process, the skin turns black and dies. Even when this happens, the torn skin acts as a good dressing until the skin underneath gets healthier and repairs itself.  HOME CARE INSTRUCTIONS   · Change dressings once per day or as directed by your caregiver.  ¨ Gently clean the skin tear and the area around the tear using saline solution or mild soap and water.  ¨ Do not rub the injured skin dry. Let the area air dry.  ¨ Apply petroleum jelly or an antibiotic cream or ointment to keep the tear moist. This will help the wound heal. Do not allow a scab to form.  ¨ If the dressing sticks before the next dressing change, moisten it with warm soapy water and gently remove it.  · Protect the injured skin until it has healed.  · Only take over-the-counter or prescription medicines as directed by your caregiver.  · Take showers or baths using warm soapy water. Apply a new dressing after the shower or bath.  · Keep all follow-up appointments as directed by your caregiver.    SEEK IMMEDIATE MEDICAL CARE IF:   · You have redness, swelling, or increasing pain in the skin tear.  · You have pus coming from the skin tear.  · You have chills.  · You have a red streak that goes away from the skin tear.  · You have a bad smell coming from the tear or dressing.  · You have a fever or persistent symptoms for more than 2-3 days.  · You  have a fever and your symptoms suddenly get worse.  MAKE SURE YOU:  · Understand these instructions.  · Will watch this condition.  · Will get help right away if your child is not doing well or gets worse.  Document Released: 11/24/2000 Document Revised: 11/24/2011 Document Reviewed: 09/13/2011  [employer]® Patient Information ©2015 [employer]. This information is not intended to replace advice given to you by your health care provider. Make sure you discuss any questions you have with your health care provider.

## 2013-12-08 NOTE — ED Provider Notes (Signed)
CSN: [id number]     Arrival date & time 12/08/13  1508 History   First MD Initiated Contact with Patient 12/08/13 1524     Chief Complaint  Patient presents with  . Wound Check   (Consider location/radiation/quality/duration/timing/severity/associated sxs/prior Treatment) Patient is a 80 y.o. female presenting with wound check.  Wound Check   80 year old female presents for wound check. She was seen in the emergency department 3 days ago for a hip dislocation, she sustained a skin tear of her left calf during the attempted reduction. She is here because she is still having pain in that area as well as some bleeding and she wants to make sure it is not infected. She is not having any problems with her hip right now. She denies any pain distal to this or swelling. She has soreness in her hamstring that is resolving.    Past Medical History  Diagnosis Date  . Pericarditis     RECURRENT   . Hypertension   . Thoracic aneurysm     "STABLE ANEURYSMAL DISEASE OF THE ASCENDING THORACIC AORTA WITH MAXIMAL DIAMETER OF 4.6" PER MRI CHEST 12/13/11 -AND LAST OFFICE NOTE DR. BARTLE IN [facility] 12/14/2011  . Heart murmur     "MILD AORTIC INSUFFICIENCY" PER CARDIOLOGY OFFICE NOTE DR. Linard Millers 12/01/11  . Anemia     CHRONIC  . Dysrhythmia     HX OF PAROXYSMAL SUPRAVENTRICULAR TACHYCARDIA ONCE - PT STATES NO PROBLEM WITH RE-OCCURANCE  . Chronic kidney disease     PT TOLD "MILD KIDNEY DISEASE FROM ALL THE DYE USED FOR XRAYS"  . GERD (gastroesophageal reflux disease)   . Cancer     SKIN CANCERS REMOVED FROM LEGS  . Arthritis     OA-SOME BACK AND NECK PAIN,  GOES TO CHIROPRACTOR TWICE A MONTH;  HX OF JOINT REPLACEMENTS   . Anemia requiring transfusions     POST OPERATIVELY   Past Surgical History  Procedure Laterality Date  . Appendectomy    . Total knee arthroplasty      right  . Revision total hip arthroplasty      left  . Vaginal hysterectomy      with ovarian cyst removal  . Pelvic laparoscopy       ovarian cyst removal,   . Oophorectomy      has a partial of one ovary remaingin  . Right achilles tendon repair  5/13  . Total knee arthroplasty      left  . Joint replacement      LEFT TOTAL HIP REPLACEMENT AND REVISIONS X 2  . Excision/release bursa hip Left 11/24/2012    Procedure: LEFT HIP BURSECTOMY AND TENDON REPAIR ;  Surgeon: Gearlean Alf, MD;  Location: [facility];  Service: Orthopedics;  Laterality: Left;  . Hip closed reduction Left 12/19/2012    Procedure: CLOSED MANIPULATION HIP;  Surgeon: Mauri Pole, MD;  Location: [facility];  Service: Orthopedics;  Laterality: Left;  . Hip closed reduction Left 12/05/2013    Procedure: CLOSED MANIPULATION HIP;  Surgeon: Augustin Schooling, MD;  Location: [facility];  Service: Orthopedics;  Laterality: Left;   Family History  Problem Relation Age of Onset  . Hypertension Mother   . Stroke Mother   . Heart attack Father 6   History  Substance Use Topics  . Smoking status: Former Smoker    Types: Cigarettes  . Smokeless tobacco: Never Used     Comment: quit age 5  . Alcohol Use: No  OB History   Grav Para Term Preterm Abortions TAB SAB Ect Mult Living   2 2        2      Review of Systems  Skin: Positive for wound (see history of present illness).  All other systems reviewed and are negative.   Allergies  Clindamycin/lincomycin; Cephalexin; Ciprofloxacin; Shellfish allergy; Sulfa antibiotics; and Sulfites  Home Medications   Prior to Admission medications   Medication Sig Start Date End Date Taking? Authorizing Provider  aspirin 81 MG tablet Take 81 mg by mouth daily.    Historical Provider, MD  Calcium Carbonate-Vit D-Min (CALCIUM 1200 PO) Take 1 tablet by mouth daily.     Historical Provider, MD  carboxymethylcellulose (REFRESH TEARS) 0.5 % SOLN Place 1 drop into both eyes 2 (two) times daily as needed (dry eyes).    Historical Provider, MD  carvedilol (COREG) 12.5 MG tablet Take 6.25-12.5 mg by mouth 2 (two) times daily.  Takes 1/2 tablet in am and 1 tablet at night    Historical Provider, MD  cholecalciferol (VITAMIN D) 1000 UNITS tablet Take 1,000 Units by mouth daily.    Historical Provider, MD  clobetasol cream (TEMOVATE) 3.55 % Apply 1 application topically 2 (two) times daily as needed (itching).  08/08/13   Historical Provider, MD  diltiazem (CARDIZEM CD) 240 MG 24 hr capsule Take 1 capsule (240 mg total) by mouth daily. 03/02/13   Belva Crome III, MD  estrogens, conjugated, (PREMARIN) 0.3 MG tablet Take 1 tablet (0.3 mg total) by mouth daily. 01/23/13   Anastasio Auerbach, MD  fish oil-omega-3 fatty acids 1000 MG capsule Take 2 g by mouth daily.    Historical Provider, MD  mupirocin ointment (BACTROBAN) 2 % Apply 1 application topically 2 (two) times daily. 12/08/13   Liam Graham, PA-C  omeprazole (PRILOSEC) 20 MG capsule Take 20 mg by mouth daily.    Historical Provider, MD  traMADol (ULTRAM) 50 MG tablet Take 1 tablet (50 mg total) by mouth every 6 (six) hours as needed. 12/05/13   Augustin Schooling, MD  valsartan (DIOVAN) 160 MG tablet Take 1 tablet (160 mg total) by mouth 2 (two) times daily. 11/28/13   Scott T Weaver, PA-C   BP 142/70  Pulse 85  Temp(Src) 98.5 F (36.9 C) (Oral)  Resp 16  SpO2 96% Physical Exam  Nursing note and vitals reviewed. Constitutional: She is oriented to person, place, and time. Vital signs are normal. She appears well-developed and well-nourished. No distress.  HENT:  Head: Normocephalic and atraumatic.  Pulmonary/Chest: Effort normal. No respiratory distress.  Musculoskeletal:       Right lower leg: She exhibits laceration (10 cm diameter circular skin tear, the overlying skin is completely removed, there is early granulation tissue in the wound bed, no signs of infection.).  Neurological: She is alert and oriented to person, place, and time. She has normal strength. Coordination normal.  Skin: Skin is warm and dry. No rash noted. She is not diaphoretic.   Psychiatric: She has a normal mood and affect. Judgment normal.    ED Course  Procedures (including critical care time) Labs Review Labs Reviewed - No data to display  Imaging Review No results found.   MDM   1. Skin tear of lower leg without complication, unspecified laterality, subsequent encounter    No signs of infection at this point. Will prescribe mupirocin ointment to prevent infection, she will keep clean, covered, and dry. She will followup with her  regular doctor for a recheck.   Meds ordered this encounter  Medications  . mupirocin ointment (BACTROBAN) 2 %    Sig: Apply 1 application topically 2 (two) times daily.    Dispense:  30 g    Refill:  0    Order Specific Question:  Supervising Provider    Answer:  Ihor Gully D [place], PA-C 12/08/13 [phone]

## 2013-12-10 DIAGNOSIS — Z23 Encounter for immunization: Secondary | ICD-10-CM | POA: Diagnosis not present

## 2013-12-10 DIAGNOSIS — T148XXA Other injury of unspecified body region, initial encounter: Secondary | ICD-10-CM | POA: Diagnosis not present

## 2013-12-11 DIAGNOSIS — S81802D Unspecified open wound, left lower leg, subsequent encounter: Secondary | ICD-10-CM | POA: Diagnosis not present

## 2013-12-11 DIAGNOSIS — M25552 Pain in left hip: Secondary | ICD-10-CM | POA: Diagnosis not present

## 2013-12-11 NOTE — Discharge Summary (Signed)
Physician Discharge Summary   Patient ID: [name] MRN: [id number] DOB/AGE: Aug 11, 1929 80 y.o.  Admit date: 12/05/2013 Discharge date: 12/05/13  Admission Diagnoses:  Active Problems:   Hip dislocation, left   Discharge Diagnoses:  Same   Surgeries: Procedure(s): CLOSED MANIPULATION HIP on 12/05/2013   Consultants: PT/OT  Discharged Condition: Stable  Hospital Course: [name] is an 80 y.o. female who was admitted 12/05/2013 with a chief complaint of  Chief Complaint  Patient presents with  . Hip Injury  , and found to have a diagnosis of <principal problem not specified>.  They were brought to the operating room on 12/05/2013 and underwent the above named procedures.    The patient had an uncomplicated hospital course and was stable for discharge.  Recent vital signs:  Filed Vitals:   12/05/13 1900  BP: 174/77  Pulse: 75  Temp: 98 F (36.7 C)  Resp: 18    Recent laboratory studies:  Results for orders placed during the hospital encounter of 12/05/13  CBC WITH DIFFERENTIAL      Result Value Ref Range   WBC 8.4  4.0 - 10.5 K/uL   RBC 3.27 (*) 3.87 - 5.11 MIL/uL   Hemoglobin 9.3 (*) 12.0 - 15.0 g/dL   HCT 27.7 (*) 36.0 - 46.0 %   MCV 84.7  78.0 - 100.0 fL   MCH 28.4  26.0 - 34.0 pg   MCHC 33.6  30.0 - 36.0 g/dL   RDW 14.1  11.5 - 15.5 %   Platelets 198  150 - 400 K/uL   Neutrophils Relative % 69  43 - 77 %   Neutro Abs 5.8  1.7 - 7.7 K/uL   Lymphocytes Relative 18  12 - 46 %   Lymphs Abs 1.5  0.7 - 4.0 K/uL   Monocytes Relative 9  3 - 12 %   Monocytes Absolute 0.7  0.1 - 1.0 K/uL   Eosinophils Relative 3  0 - 5 %   Eosinophils Absolute 0.3  0.0 - 0.7 K/uL   Basophils Relative 1  0 - 1 %   Basophils Absolute 0.1  0.0 - 0.1 K/uL  BASIC METABOLIC PANEL      Result Value Ref Range   Sodium 136 (*) 137 - 147 mEq/L   Potassium 4.3  3.7 - 5.3 mEq/L   Chloride 102  96 - 112 mEq/L   CO2 22  19 - 32 mEq/L   Glucose, Bld 89  70 - 99 mg/dL   BUN  20  6 - 23 mg/dL   Creatinine, Ser 0.98  0.50 - 1.10 mg/dL   Calcium 8.3 (*) 8.4 - 10.5 mg/dL   GFR calc non Af Amer 53 (*) >90 mL/min   GFR calc Af Amer 61 (*) >90 mL/min   Anion gap 12  5 - 15    Discharge Medications:     Medication List         aspirin 81 MG tablet  Take 81 mg by mouth daily.     CALCIUM 1200 PO  Take 1 tablet by mouth daily.     carvedilol 12.5 MG tablet  Commonly known as:  COREG  Take 6.25-12.5 mg by mouth 2 (two) times daily. Takes 1/2 tablet in am and 1 tablet at night     cholecalciferol 1000 UNITS tablet  Commonly known as:  VITAMIN D  Take 1,000 Units by mouth daily.     clobetasol cream 0.05 %  Commonly known as:  TEMOVATE  Apply 1 application topically 2 (two) times daily as needed (itching).     diltiazem 240 MG 24 hr capsule  Commonly known as:  CARDIZEM CD  Take 1 capsule (240 mg total) by mouth daily.     estrogens (conjugated) 0.3 MG tablet  Commonly known as:  PREMARIN  Take 1 tablet (0.3 mg total) by mouth daily.     fish oil-omega-3 fatty acids 1000 MG capsule  Take 2 g by mouth daily.     omeprazole 20 MG capsule  Commonly known as:  PRILOSEC  Take 20 mg by mouth daily.     REFRESH TEARS 0.5 % Soln  Generic drug:  carboxymethylcellulose  Place 1 drop into both eyes 2 (two) times daily as needed (dry eyes).     traMADol 50 MG tablet  Commonly known as:  ULTRAM  Take 1 tablet (50 mg total) by mouth every 6 (six) hours as needed.     valsartan 160 MG tablet  Commonly known as:  DIOVAN  Take 1 tablet (160 mg total) by mouth 2 (two) times daily.        Diagnostic Studies: Dg Hip 1 View Left  12/05/2013   CLINICAL DATA:  80 year old female with left hip dislocation, status post reduction  EXAM: LEFT HIP - 1 VIEW:  COMPARISON:  12/05/2013  FINDINGS: Single frontal view of the left hip.  Surgical changes of prior left-sided total hip arthroplasty again noted. On this single frontal view, the femoral component projects  normally over the acetabular component, status post reduction.  No acute bony abnormality identified.  IMPRESSION: Femoral component projects normally over the acetabular component, status post reduction of left hip dislocation.  Surgical changes of prior total left hip arthroplasty.  Signed,  Dulcy Fanny. Earleen Newport, DO  [facility]  [facility]   Electronically Signed   By: Corrie Mckusick O.D.   On: 12/05/2013 18:46   Dg Hip Complete Left  12/05/2013   CLINICAL DATA:  Slipped while getting out of tub, felt a pop, pain and limited movement question dislocation  EXAM: LEFT HIP - COMPLETE 2+ VIEW  COMPARISON:  12/19/2012  FINDINGS: Acetabular and femoral components of a LEFT hip prosthesis again identified.  Superior dislocation of LEFT femoral component from acetabular cup.  Osseous mineralization normal.  No fractures seen.  IMPRESSION: Dislocated LEFT hip prosthesis.   Electronically Signed   By: Lavonia Dana M.D.   On: 12/05/2013 12:49    Disposition: 01-Home or Self Care      Discharge Instructions   Call MD / Call 911    Complete by:  As directed   If you experience chest pain or shortness of breath, CALL 911 and be transported to the hospital emergency room.  If you develope a fever above 101 F, pus (white drainage) or increased drainage or redness at the wound, or calf pain, call your surgeon's office.     Constipation Prevention    Complete by:  As directed   Drink plenty of fluids.  Prune juice may be helpful.  You may use a stool softener, such as Colace (over the counter) 100 mg twice a day.  Use MiraLax (over the counter) for constipation as needed.     Diet - low sodium heart healthy    Complete by:  As directed      Increase activity slowly as tolerated    Complete by:  As directed  Follow-up Information   Follow up with Gearlean Alf, MD. Schedule an appointment as soon as possible for a visit in 1 week. (Call [phone] tomorrow  to make an appointment for next week)    Specialty:  Orthopedic Surgery   Contact information:   [address] [place] [address] [phone]        Signed: Ventura Bruns 12/11/2013, 8:26 AM

## 2013-12-12 DIAGNOSIS — M25552 Pain in left hip: Secondary | ICD-10-CM | POA: Diagnosis not present

## 2013-12-12 DIAGNOSIS — S81802D Unspecified open wound, left lower leg, subsequent encounter: Secondary | ICD-10-CM | POA: Diagnosis not present

## 2013-12-17 DIAGNOSIS — M25552 Pain in left hip: Secondary | ICD-10-CM | POA: Diagnosis not present

## 2013-12-17 DIAGNOSIS — S81802D Unspecified open wound, left lower leg, subsequent encounter: Secondary | ICD-10-CM | POA: Diagnosis not present

## 2013-12-18 DIAGNOSIS — M9902 Segmental and somatic dysfunction of thoracic region: Secondary | ICD-10-CM | POA: Diagnosis not present

## 2013-12-18 DIAGNOSIS — M9903 Segmental and somatic dysfunction of lumbar region: Secondary | ICD-10-CM | POA: Diagnosis not present

## 2013-12-18 DIAGNOSIS — M9901 Segmental and somatic dysfunction of cervical region: Secondary | ICD-10-CM | POA: Diagnosis not present

## 2013-12-19 DIAGNOSIS — Z85828 Personal history of other malignant neoplasm of skin: Secondary | ICD-10-CM | POA: Diagnosis not present

## 2013-12-19 DIAGNOSIS — M25552 Pain in left hip: Secondary | ICD-10-CM | POA: Diagnosis not present

## 2013-12-19 DIAGNOSIS — S81802D Unspecified open wound, left lower leg, subsequent encounter: Secondary | ICD-10-CM | POA: Diagnosis not present

## 2013-12-19 DIAGNOSIS — L851 Acquired keratosis [keratoderma] palmaris et plantaris: Secondary | ICD-10-CM | POA: Diagnosis not present

## 2013-12-21 DIAGNOSIS — Z85828 Personal history of other malignant neoplasm of skin: Secondary | ICD-10-CM | POA: Diagnosis not present

## 2013-12-26 DIAGNOSIS — L299 Pruritus, unspecified: Secondary | ICD-10-CM | POA: Diagnosis not present

## 2013-12-26 DIAGNOSIS — Z85828 Personal history of other malignant neoplasm of skin: Secondary | ICD-10-CM | POA: Diagnosis not present

## 2014-01-01 DIAGNOSIS — M9903 Segmental and somatic dysfunction of lumbar region: Secondary | ICD-10-CM | POA: Diagnosis not present

## 2014-01-01 DIAGNOSIS — M9901 Segmental and somatic dysfunction of cervical region: Secondary | ICD-10-CM | POA: Diagnosis not present

## 2014-01-01 DIAGNOSIS — M9902 Segmental and somatic dysfunction of thoracic region: Secondary | ICD-10-CM | POA: Diagnosis not present

## 2014-01-02 ENCOUNTER — Ambulatory Visit
Admission: RE | Admit: 2014-01-02 | Discharge: 2014-01-02 | Disposition: A | Discharge status: Home / Self Care | Payer: Medicare Other | Source: Ambulatory Visit | Attending: Surgery | Admitting: Surgery

## 2014-01-02 ENCOUNTER — Ambulatory Visit (INDEPENDENT_AMBULATORY_CARE_PROVIDER_SITE_OTHER): Payer: Medicare Other | Admitting: Surgery

## 2014-01-02 ENCOUNTER — Encounter: Payer: Self-pay | Admitting: Surgery

## 2014-01-02 VITALS — BP 135/74 | HR 69 | Resp 20 | Ht 64.0 in | Wt 159.0 lb

## 2014-01-02 DIAGNOSIS — I7121 Aneurysm of the ascending aorta, without rupture: Secondary | ICD-10-CM

## 2014-01-02 DIAGNOSIS — I712 Thoracic aortic aneurysm, without rupture, unspecified: Secondary | ICD-10-CM

## 2014-01-02 MED ORDER — GADOBENATE DIMEGLUMINE 529 MG/ML IV SOLN
15.0000 mL | Freq: Once | INTRAVENOUS | Status: AC | PRN
  Administered 2014-01-02: 15 mL via INTRAVENOUS

## 2014-01-03 ENCOUNTER — Encounter: Payer: Self-pay | Admitting: Surgery

## 2014-01-03 NOTE — Progress Notes (Signed)
HPI:  The patient is here today for followup of an ascending aortic aneurysm. I last saw her on 12/14/2011 at which time an MR angiogram showed that the aneurysm was stable at 4.6 cm and had not changed over the previous 2 years. This has been followed since 1999 at which time it was 4 cm. She does have a family history of aortic dissection with 2 sisters dying of suspected aortic dissection in their 55's. She has a murmur of aortic insufficiency that has been followed by Dr. Daneen Schick. She had mild to moderate AI in 2012 and a repeat echo has recently been scheduled by cardiology. Since I last saw her she said that she has been feeling well overall. Her only problems have been orthopedic with repeated left hip dislocation.   Current Outpatient Prescriptions  Medication Sig Dispense Refill  . aspirin 81 MG tablet Take 81 mg by mouth daily.      . Calcium Carbonate-Vit D-Min (CALCIUM 1200 PO) Take 1 tablet by mouth daily.       . carboxymethylcellulose (REFRESH TEARS) 0.5 % SOLN Place 1 drop into both eyes 2 (two) times daily as needed (dry eyes).      . carvedilol (COREG) 12.5 MG tablet Take 6.25-12.5 mg by mouth 2 (two) times daily. Takes 1/2 tablet in am and 1 tablet at night      . cholecalciferol (VITAMIN D) 1000 UNITS tablet Take 1,000 Units by mouth daily.      . clobetasol cream (TEMOVATE) 0.86 % Apply 1 application topically 2 (two) times daily as needed (itching).       Marland Kitchen diltiazem (CARDIZEM CD) 240 MG 24 hr capsule Take 1 capsule (240 mg total) by mouth daily.  90 capsule  3  . estrogens, conjugated, (PREMARIN) 0.3 MG tablet Take 1 tablet (0.3 mg total) by mouth daily.  90 tablet  4  . fish oil-omega-3 fatty acids 1000 MG capsule Take 2 g by mouth daily.      . mupirocin ointment (BACTROBAN) 2 % Apply 1 application topically 2 (two) times daily.  30 g  0  . omeprazole (PRILOSEC) 20 MG capsule Take 20 mg by mouth daily.      . traMADol (ULTRAM) 50 MG tablet Take 1 tablet (50 mg  total) by mouth every 6 (six) hours as needed.  60 tablet  0  . valsartan (DIOVAN) 160 MG tablet Take 1 tablet (160 mg total) by mouth 2 (two) times daily.  60 tablet  11   No current facility-administered medications for this visit.     Physical Exam: BP 135/74  Pulse 69  Resp 20  Ht 5\' 4"  (1.626 m)  Wt 159 lb (72.122 kg)  BMI 27.28 kg/m2  SpO2 96% She looks well.  Cardiac exam shows a regular rate and rhythm with a grade 1/6 diastolic murmur of aortic insufficiency.  Lung sounds clear   Diagnostic Tests:  CLINICAL DATA: Evaluate thoracic aortic aneurysm.  EXAM:  MRA CHEST WITH CONTRAST  TECHNIQUE:  Angiographic images of the chest were obtained using MRA technique  without and with intravenous contrast.  CONTRAST: 72mL MULTIHANCE GADOBENATE DIMEGLUMINE 529 MG/ML IV SOLN  COMPARISON: Chest MRA - 12/13/2011; 01/06/2010  FINDINGS:  Vascular Findings of the chest:  There is grossly unchanged fusiform aneurysmal dilatation of the  ascending thoracic aorta with measurements as follows. The thoracic  aorta again tapers to a near normal caliber at the level of the  distal aspect of  the aortic arch. Incidental note is made of a small  ductus diverticulum. No definite thoracic aortic dissection or  periaortic stranding. Conventional configuration of the aortic arch.  The branch vessels of the aortic arch are widely patent throughout  their imaged course.  Normal heart size. No pericardial effusion. Although this  examination was not tailored for the evaluation of the pulmonary  arteries, there are no discrete filling defects within the central  pulmonary arterial tree. Normal caliber the main pulmonary artery.  -------------------------------------------------------------  Thoracic aortic measurements:  Sinotubular junction  37 mm as measured in greatest oblique sagittal dimension (image 53,  series 10).  Proximal ascending aorta  44 x 45 mm as measured in greatest oblique  axial dimension at the  level of the main pulmonary artery (image 12, series 2), grossly  unchanged; approximately 40 mm in greatest oblique sagittal  dimension (image 38, series 10), grossly unchanged.  Aortic arch aorta  33 mm as measured in greatest oblique sagittal dimension (image 15,  series 5), unchanged.  Proximal descending thoracic aorta  30 x 29 mm as measured in greatest oblique axial dimension at the  level of the main pulmonary artery (image 12, series 2), grossly  unchanged, previously, unchanged.  Distal descending thoracic aorta  28 mm as measured in greatest oblique sagittal dimension at the  level of the diaphragmatic hiatus (sagittal image 14, series 5),  unchanged.  Review of the MIP images confirms the above findings.  -------------------------------------------------------------  Non-Vascular Findings of the chest:  No focal airspace opacities. No pleural effusion. Regional soft  tissues appear normal. Limited visualization the upper abdomen is  normal.  IMPRESSION:  Fusiform aneurysmal dilatation of the ascending thoracic aorta  measuring approximately 45 mm in maximal diameter, grossly unchanged  since the 2011 examination.  Electronically Signed  By: Sandi Mariscal M.D.  On: 01/02/2014 12:39   Impression:  She has a stable 4.6 cm fusiform ascending aortic aneurysm that has not changed in size over the past 8 years. I stressed the importance of good blood pressure control. She does have some aortic insufficiency on exam that has been followed by Dr. Tamala Julian with periodic echocardiogram and she is scheduled for a repeat echo soon. She is 5 and asked about discontinuing follow up of this but she is still in good condition and I think we should continue follow up especially given her family history. I think we can do an MRA in 2 years as long as she is having an echo yearly which will evaluate the aortic root and proximal ascending aorta.  Plan:  I will see her  back in 2 years with an MRA of the chest.

## 2014-01-09 DIAGNOSIS — L570 Actinic keratosis: Secondary | ICD-10-CM | POA: Diagnosis not present

## 2014-01-09 DIAGNOSIS — Z85828 Personal history of other malignant neoplasm of skin: Secondary | ICD-10-CM | POA: Diagnosis not present

## 2014-01-14 ENCOUNTER — Ambulatory Visit (INDEPENDENT_AMBULATORY_CARE_PROVIDER_SITE_OTHER): Payer: Medicare Other | Admitting: Interventional Cardiology

## 2014-01-14 ENCOUNTER — Ambulatory Visit: Payer: Medicare Other | Admitting: Interventional Cardiology

## 2014-01-14 ENCOUNTER — Encounter: Payer: Self-pay | Admitting: Interventional Cardiology

## 2014-01-14 VITALS — BP 130/62 | HR 65 | Ht 64.5 in | Wt 162.8 lb

## 2014-01-14 DIAGNOSIS — I10 Essential (primary) hypertension: Secondary | ICD-10-CM | POA: Diagnosis not present

## 2014-01-14 DIAGNOSIS — I712 Thoracic aortic aneurysm, without rupture, unspecified: Secondary | ICD-10-CM

## 2014-01-14 DIAGNOSIS — I351 Nonrheumatic aortic (valve) insufficiency: Secondary | ICD-10-CM

## 2014-01-14 DIAGNOSIS — M9902 Segmental and somatic dysfunction of thoracic region: Secondary | ICD-10-CM | POA: Diagnosis not present

## 2014-01-14 DIAGNOSIS — M9901 Segmental and somatic dysfunction of cervical region: Secondary | ICD-10-CM | POA: Diagnosis not present

## 2014-01-14 DIAGNOSIS — M9903 Segmental and somatic dysfunction of lumbar region: Secondary | ICD-10-CM | POA: Diagnosis not present

## 2014-01-14 NOTE — Progress Notes (Signed)
Patient ID: [name], female   DOB: 03/18/1929, 80 y.o.   MRN: [id number]    [address], [address] [place], [place]  [address] Phone: [phone] Fax:  [phone]  Date:  01/14/2014   ID:  [name], DOB Sep 29, 1929, MRN [id number]  PCP:  Darlene Hams, MD   ASSESSMENT:  1. Mild aortic regurgitation with out a clinically detectable murmur on today's exam 2. Stable ascending thoracic aortic aneurysm followed by Dr. Cyndia Wall 3. Hypertension under good control 4. Recent palpitations have completely resolved. She refuses her event monitor  PLAN:  1. The patient does not want to have an echocardiogram to follow the aortic regurgitation. They swelling clinical exam I have no problem with that 2. We will cancel of the event monitor as she is unwilling to proceed with that.   SUBJECTIVE: [name] is a 80 y.o. female who is doing well. Chest thoracic aneurysm and is followed by Dr. Cyndia Wall. She has aortic regurgitation and is also followed by me for that and hypertension. She is doing well. Blood pressures of been stable. She was seen in September because of palpitations and elevated blood pressure. [name] we will order a panel of tests. She has yet to have the echocardiogram on the event monitor performed. She currently doesn't want to have any further testing done. She denies orthopnea, PND, syncope, and chest pain.  Wt Readings from Last 3 Encounters:  01/14/14 162 lb 12.8 oz (73.846 kg)  01/02/14 159 lb (72.122 kg)  12/05/13 158 lb (71.668 kg)     Past Medical History  Diagnosis Date  . Pericarditis     RECURRENT   . Hypertension   . Thoracic aneurysm     "STABLE ANEURYSMAL DISEASE OF THE ASCENDING THORACIC AORTA WITH MAXIMAL DIAMETER OF 4.6" PER MRI CHEST 12/13/11 -AND LAST OFFICE NOTE DR. BARTLE IN [facility] 12/14/2011  . Heart murmur     "MILD AORTIC INSUFFICIENCY" PER CARDIOLOGY OFFICE NOTE DR. Linard Wall 12/01/11  . Anemia     CHRONIC  . Dysrhythmia     HX OF PAROXYSMAL SUPRAVENTRICULAR TACHYCARDIA ONCE - PT STATES NO PROBLEM WITH RE-OCCURANCE  . Chronic kidney disease     PT TOLD "MILD KIDNEY DISEASE FROM ALL THE DYE USED FOR XRAYS"  . GERD (gastroesophageal reflux disease)   . Cancer     SKIN CANCERS REMOVED FROM LEGS  . Arthritis     OA-SOME BACK AND NECK PAIN,  GOES TO CHIROPRACTOR TWICE A MONTH;  HX OF JOINT REPLACEMENTS   . Anemia requiring transfusions     POST OPERATIVELY    Current Outpatient Prescriptions  Medication Sig Dispense Refill  . aspirin 81 MG tablet Take 81 mg by mouth daily.    . Calcium Carbonate-Vit D-Min (CALCIUM 1200 PO) Take 1 tablet by mouth daily.     . carboxymethylcellulose (REFRESH TEARS) 0.5 % SOLN Place 1 drop into both eyes 2 (two) times daily as needed (dry eyes).    . carvedilol (COREG) 12.5 MG tablet Take 6.25-12.5 mg by mouth 2 (two) times daily. Takes 1/2 tablet in am and 1 tablet at night    . cholecalciferol (VITAMIN D) 1000 UNITS tablet Take 1,000 Units by mouth daily.    . clobetasol cream (TEMOVATE) 9.62 % Apply 1 application topically 2 (two) times daily as needed (itching).     Marland Kitchen diltiazem (CARDIZEM CD) 240 MG 24 hr capsule Take 1 capsule (240 mg total) by mouth daily. 90 capsule  3  . estrogens, conjugated, (PREMARIN) 0.3 MG tablet Take 1 tablet (0.3 mg total) by mouth daily. 90 tablet 4  . fish oil-omega-3 fatty acids 1000 MG capsule Take 2 g by mouth daily.    . mupirocin ointment (BACTROBAN) 2 % Apply 1 application topically 2 (two) times daily. 30 g 0  . omeprazole (PRILOSEC) 20 MG capsule Take 20 mg by mouth daily.    . traMADol (ULTRAM) 50 MG tablet Take 1 tablet (50 mg total) by mouth every 6 (six) hours as needed. 60 tablet 0  . valsartan (DIOVAN) 160 MG tablet Take 1 tablet (160 mg total) by mouth 2 (two) times daily. 60 tablet 11   No current facility-administered medications for this visit.    Allergies:    Allergies  Allergen Reactions  . Clindamycin/Lincomycin Rash  .  Cephalexin     Stroke symptoms  . Ciprofloxacin     RASH  . Keflex [Cephalexin] Other (See Comments)    Pt states eye problems with this medicine  . Shellfish Allergy Hives  . Sulfa Antibiotics     Unknown   . Sulfites     Social History:  The patient  reports that she has quit smoking. Her smoking use included Cigarettes. She smoked 0.00 packs per day. She has never used smokeless tobacco. She reports that she does not drink alcohol or use illicit drugs.   ROS:  Please see the history of present illness.   Elderly and getting frailer. Appetite is stable. No edema. No orthopnea. No transient neurological symptoms.   All other systems reviewed and negative.   OBJECTIVE: VS:  BP 130/62 mmHg  Pulse 65  Ht 5' 4.5" (1.638 m)  Wt 162 lb 12.8 oz (73.846 kg)  BMI 27.52 kg/m2  SpO2 98% Well nourished, well developed, in no acute distress, compatible with her age 41: normal Neck: JVD flat. Carotid bruit absent  Cardiac:  normal S1, S2; RRR; no murmur Lungs:  clear to auscultation bilaterally, no wheezing, rhonchi or rales Abd: soft, nontender, no hepatomegaly Ext: Edema absent. Pulses 2+ and symmetric Skin: warm and dry Neuro:  CNs 2-12 intact, no focal abnormalities noted  EKG:  Not performed       Signed, Darlene Labrador III, MD 01/14/2014 11:01 AM

## 2014-01-14 NOTE — Patient Instructions (Addendum)
Your physician recommends that you continue on your current medications as directed. Please refer to the Current Medication list given to you today.  Your physician wants you to follow-up in: 1 year with [name] You will receive a reminder letter in the mail two months in advance. If you don't receive a letter, please call our office to schedule the follow-up appointment.  

## 2014-01-26 ENCOUNTER — Emergency Department (HOSPITAL_COMMUNITY): Payer: Medicare Other

## 2014-01-26 ENCOUNTER — Encounter (HOSPITAL_COMMUNITY): Payer: Self-pay | Admitting: *Deleted

## 2014-01-26 ENCOUNTER — Emergency Department (HOSPITAL_COMMUNITY)
Admission: EM | Admit: 2014-01-26 | Discharge: 2014-01-26 | Disposition: A | Discharge status: Home / Self Care | Payer: Medicare Other | Attending: Emergency Medicine | Admitting: Emergency Medicine

## 2014-01-26 DIAGNOSIS — Z85828 Personal history of other malignant neoplasm of skin: Secondary | ICD-10-CM | POA: Diagnosis not present

## 2014-01-26 DIAGNOSIS — N189 Chronic kidney disease, unspecified: Secondary | ICD-10-CM | POA: Insufficient documentation

## 2014-01-26 DIAGNOSIS — X58XXXA Exposure to other specified factors, initial encounter: Secondary | ICD-10-CM | POA: Insufficient documentation

## 2014-01-26 DIAGNOSIS — M1990 Unspecified osteoarthritis, unspecified site: Secondary | ICD-10-CM | POA: Diagnosis not present

## 2014-01-26 DIAGNOSIS — K219 Gastro-esophageal reflux disease without esophagitis: Secondary | ICD-10-CM | POA: Insufficient documentation

## 2014-01-26 DIAGNOSIS — S73005A Unspecified dislocation of left hip, initial encounter: Secondary | ICD-10-CM | POA: Diagnosis not present

## 2014-01-26 DIAGNOSIS — Z87891 Personal history of nicotine dependence: Secondary | ICD-10-CM | POA: Insufficient documentation

## 2014-01-26 DIAGNOSIS — Z7982 Long term (current) use of aspirin: Secondary | ICD-10-CM | POA: Diagnosis not present

## 2014-01-26 DIAGNOSIS — Y998 Other external cause status: Secondary | ICD-10-CM | POA: Diagnosis not present

## 2014-01-26 DIAGNOSIS — R030 Elevated blood-pressure reading, without diagnosis of hypertension: Secondary | ICD-10-CM | POA: Diagnosis not present

## 2014-01-26 DIAGNOSIS — Y92000 Kitchen of unspecified non-institutional (private) residence as  the place of occurrence of the external cause: Secondary | ICD-10-CM | POA: Insufficient documentation

## 2014-01-26 DIAGNOSIS — S73002A Unspecified subluxation of left hip, initial encounter: Secondary | ICD-10-CM | POA: Diagnosis not present

## 2014-01-26 DIAGNOSIS — Z79899 Other long term (current) drug therapy: Secondary | ICD-10-CM | POA: Insufficient documentation

## 2014-01-26 DIAGNOSIS — Z862 Personal history of diseases of the blood and blood-forming organs and certain disorders involving the immune mechanism: Secondary | ICD-10-CM | POA: Diagnosis not present

## 2014-01-26 DIAGNOSIS — Y9389 Activity, other specified: Secondary | ICD-10-CM | POA: Diagnosis not present

## 2014-01-26 DIAGNOSIS — M25552 Pain in left hip: Secondary | ICD-10-CM | POA: Diagnosis not present

## 2014-01-26 DIAGNOSIS — R011 Cardiac murmur, unspecified: Secondary | ICD-10-CM | POA: Diagnosis not present

## 2014-01-26 DIAGNOSIS — Z96641 Presence of right artificial hip joint: Secondary | ICD-10-CM | POA: Diagnosis not present

## 2014-01-26 DIAGNOSIS — Z96642 Presence of left artificial hip joint: Secondary | ICD-10-CM | POA: Diagnosis not present

## 2014-01-26 DIAGNOSIS — S79912A Unspecified injury of left hip, initial encounter: Secondary | ICD-10-CM | POA: Diagnosis present

## 2014-01-26 DIAGNOSIS — M25559 Pain in unspecified hip: Secondary | ICD-10-CM

## 2014-01-26 DIAGNOSIS — I129 Hypertensive chronic kidney disease with stage 1 through stage 4 chronic kidney disease, or unspecified chronic kidney disease: Secondary | ICD-10-CM | POA: Insufficient documentation

## 2014-01-26 DIAGNOSIS — T84021A Dislocation of internal left hip prosthesis, initial encounter: Secondary | ICD-10-CM | POA: Diagnosis not present

## 2014-01-26 MED ORDER — MORPHINE SULFATE 4 MG/ML IJ SOLN
6.0000 mg | Freq: Once | INTRAMUSCULAR | Status: AC
  Administered 2014-01-26: 6 mg via INTRAVENOUS
  Filled 2014-01-26: qty 2

## 2014-01-26 MED ORDER — SODIUM CHLORIDE 0.9 % IV BOLUS (SEPSIS)
1000.0000 mL | Freq: Once | INTRAVENOUS | Status: AC
  Administered 2014-01-26: 1000 mL via INTRAVENOUS

## 2014-01-26 MED ORDER — PROPOFOL 10 MG/ML IV BOLUS
0.5000 mg/kg | Freq: Once | INTRAVENOUS | Status: AC
  Administered 2014-01-26: 75 mg via INTRAVENOUS
  Filled 2014-01-26: qty 1

## 2014-01-26 MED ORDER — TRAMADOL HCL 50 MG PO TABS: 50.0000 mg | ORAL_TABLET | Freq: Four times a day (QID) | ORAL | Status: DC | PRN

## 2014-01-26 MED ORDER — PROPOFOL 10 MG/ML IV BOLUS
INTRAVENOUS | Status: AC | PRN
  Administered 2014-01-26: 75 ug via INTRAVENOUS

## 2014-01-26 NOTE — ED Provider Notes (Signed)
CSN: [id number]     Arrival date & time 01/26/14  1716 History   First MD Initiated Contact with Patient 01/26/14 1722     Chief Complaint  Patient presents with  . Hip Injury      HPI Patient reports she was in a seated position and she turned resulting in a pop in her left hip.  She has a prosthetic left hip.  She has dislocated it before in the past.  She presents with a shortened left leg and reports moderate severe pain in her left hip.  She states it feels like her prior dislocations.  Her pain is worsened by movement and palpation of her left hip.  No other injury.  No other trauma.   Past Medical History  Diagnosis Date  . Pericarditis     RECURRENT   . Hypertension   . Thoracic aneurysm     "STABLE ANEURYSMAL DISEASE OF THE ASCENDING THORACIC AORTA WITH MAXIMAL DIAMETER OF 4.6" PER MRI CHEST 12/13/11 -AND LAST OFFICE NOTE DR. BARTLE IN [facility] 12/14/2011  . Heart murmur     "MILD AORTIC INSUFFICIENCY" PER CARDIOLOGY OFFICE NOTE DR. Linard Millers 12/01/11  . Anemia     CHRONIC  . Dysrhythmia     HX OF PAROXYSMAL SUPRAVENTRICULAR TACHYCARDIA ONCE - PT STATES NO PROBLEM WITH RE-OCCURANCE  . Chronic kidney disease     PT TOLD "MILD KIDNEY DISEASE FROM ALL THE DYE USED FOR XRAYS"  . GERD (gastroesophageal reflux disease)   . Cancer     SKIN CANCERS REMOVED FROM LEGS  . Arthritis     OA-SOME BACK AND NECK PAIN,  GOES TO CHIROPRACTOR TWICE A MONTH;  HX OF JOINT REPLACEMENTS   . Anemia requiring transfusions     POST OPERATIVELY   Past Surgical History  Procedure Laterality Date  . Appendectomy    . Total knee arthroplasty      right  . Revision total hip arthroplasty      left  . Vaginal hysterectomy      with ovarian cyst removal  . Pelvic laparoscopy      ovarian cyst removal,   . Oophorectomy      has a partial of one ovary remaingin  . Right achilles tendon repair  5/13  . Total knee arthroplasty      left  . Joint replacement      LEFT TOTAL HIP REPLACEMENT AND  REVISIONS X 2  . Excision/release bursa hip Left 11/24/2012    Procedure: LEFT HIP BURSECTOMY AND TENDON REPAIR ;  Surgeon: Gearlean Alf, MD;  Location: [facility];  Service: Orthopedics;  Laterality: Left;  . Hip closed reduction Left 12/19/2012    Procedure: CLOSED MANIPULATION HIP;  Surgeon: Mauri Pole, MD;  Location: [facility];  Service: Orthopedics;  Laterality: Left;  . Hip closed reduction Left 12/05/2013    Procedure: CLOSED MANIPULATION HIP;  Surgeon: Augustin Schooling, MD;  Location: [facility];  Service: Orthopedics;  Laterality: Left;   Family History  Problem Relation Age of Onset  . Hypertension Mother   . Stroke Mother   . Heart attack Father 34   History  Substance Use Topics  . Smoking status: Former Smoker    Types: Cigarettes  . Smokeless tobacco: Never Used     Comment: quit age 9  . Alcohol Use: No   OB History    Gravida Para Term Preterm AB TAB SAB Ectopic Multiple Living   2 2  2     Review of Systems  All other systems reviewed and are negative.     Allergies  Clindamycin/lincomycin; Cephalexin; Ciprofloxacin; Keflex; Shellfish allergy; Sulfa antibiotics; and Sulfites  Home Medications   Prior to Admission medications   Medication Sig Start Date End Date Taking? Authorizing Provider  aspirin 81 MG tablet Take 81 mg by mouth daily.   Yes Historical Provider, MD  Calcium Carbonate-Vit D-Min (CALCIUM 1200 PO) Take 1 tablet by mouth daily.    Yes Historical Provider, MD  carboxymethylcellulose (REFRESH TEARS) 0.5 % SOLN Place 1 drop into both eyes daily.    Yes Historical Provider, MD  carvedilol (COREG) 12.5 MG tablet Take 6.25-12.5 mg by mouth 2 (two) times daily. Takes 1/2 tablet in am and 1 tablet at night   Yes Historical Provider, MD  cholecalciferol (VITAMIN D) 1000 UNITS tablet Take 1,000 Units by mouth daily.   Yes Historical Provider, MD  clobetasol cream (TEMOVATE) 6.16 % Apply 1 application topically 2 (two) times daily as needed (itching).   08/08/13  Yes Historical Provider, MD  diltiazem (CARDIZEM CD) 240 MG 24 hr capsule Take 1 capsule (240 mg total) by mouth daily. Patient taking differently: Take 240 mg by mouth every evening.  03/02/13  Yes Belva Crome III, MD  estrogens, conjugated, (PREMARIN) 0.3 MG tablet Take 1 tablet (0.3 mg total) by mouth daily. 01/23/13  Yes Anastasio Auerbach, MD  fish oil-omega-3 fatty acids 1000 MG capsule Take 3-4 g by mouth daily.    Yes Historical Provider, MD  omeprazole (PRILOSEC) 20 MG capsule Take 20 mg by mouth daily.   Yes Historical Provider, MD  valsartan (DIOVAN) 160 MG tablet Take 1 tablet (160 mg total) by mouth 2 (two) times daily. Patient taking differently: Take 160 mg by mouth 2 (two) times daily. Or As needed for Blood Pressure 11/28/13  Yes Liliane Shi, PA-C  mupirocin ointment (BACTROBAN) 2 % Apply 1 application topically 2 (two) times daily. Patient not taking: Reported on 01/26/2014 12/08/13   Liam Graham, PA-C  traMADol (ULTRAM) 50 MG tablet Take 1 tablet (50 mg total) by mouth every 6 (six) hours as needed. 01/26/14   Hoy Morn, MD   BP 133/82 mmHg  Pulse 64  Temp(Src) 97.9 F (36.6 C) (Oral)  Resp 17  Ht 5\' 4"  (1.626 m)  Wt 160 lb (72.576 kg)  BMI 27.45 kg/m2  SpO2 100% Physical Exam  Constitutional: She is oriented to person, place, and time. She appears well-developed and well-nourished.  HENT:  Head: Normocephalic.  Eyes: EOM are normal.  Neck: Normal range of motion.  Pulmonary/Chest: Effort normal.  Abdominal: She exhibits no distension.  Musculoskeletal: Normal range of motion.  Obvious shortening and deformity of left hip.  Normal pulses in left foot.  Wiggles left toes.  Neurological: She is alert and oriented to person, place, and time.  Psychiatric: She has a normal mood and affect.  Nursing note and vitals reviewed.   ED Course  Procedures (including critical care time)  Reduction of dislocation Performed by: Hoy Morn Consent:  Verbal consent obtained. Risks and benefits: risks, benefits and alternatives were discussed Consent given by: patient Required items: required blood products, implants, devices, and special equipment available Time out: Immediately prior to procedure a "time out" was called to verify the correct patient, procedure, equipment, support staff and site/side marked as required. Patient sedated: propofol Vitals: Vital signs were monitored during sedation. Patient tolerance: Patient tolerated the procedure  well with no immediate complications. Joint: left hip Reduction technique: manipulation    Procedural sedation Performed by: Hoy Morn Consent: Verbal consent obtained. Risks and benefits: risks, benefits and alternatives were discussed Required items: required blood products, implants, devices, and special equipment available Patient identity confirmed: arm band and provided demographic data Time out: Immediately prior to procedure a "time out" was called to verify the correct patient, procedure, equipment, support staff and site/side marked as required. Sedation type: moderate (conscious) sedation NPO time confirmed and considered Sedatives: PROPOFOL Physician Time at Bedside: 16 Vitals: Vital signs were monitored during sedation. Cardiac Monitor, pulse oximeter Patient tolerance: Patient tolerated the procedure well with no immediate complications. Comments: Pt with uneventful recovery. Returned to pre-procedural sedation baseline     Labs Review Labs Reviewed - No data to display  Imaging Review Dg Hip Complete Left  01/26/2014   CLINICAL DATA:  Left hip pain. History of left total hip replacement.  EXAM: LEFT HIP - COMPLETE 2+ VIEW  COMPARISON:  12/05/2013 and prior study  FINDINGS: Left total hip replacement is noted. Superior dislocation of the femoral component is noted.  There is no evidence of fracture.  The right hip is unremarkable.  IMPRESSION: Superior dislocation  of the femoral component of the left total hip replacement.   Electronically Signed   By: Hassan Rowan M.D.   On: 01/26/2014 18:56  I personally reviewed the imaging tests through PACS system I reviewed available ER/hospitalization records through the EMR    EKG Interpretation None      MDM   Final diagnoses:  Pain in left hip  Hip pain  Dislocation of left hip, initial encounter    Successful reduction of left hip dislocation.  Discharge home in knee immobilizer with orthopedic follow-up.  Home with a short course of pain medicine.  Patient understands to return to the ER for new or worsening symptoms    Hoy Morn, MD 01/26/14 [id number]

## 2014-01-26 NOTE — ED Notes (Signed)
Per EMS patient reported sitting in the chair in the kitchen and felt "her left hip popped." Pt stated that this has happened to her two months ago.

## 2014-01-26 NOTE — Discharge Instructions (Signed)
Hip Dislocation °Hip dislocation is the displacement of the "ball" at the head of your thigh bone (femur) from its socket in the hip bone (pelvis). The ball-and-socket structure of the hip joint gives it a lot of stability, while allowing it to move freely. Therefore, a lot of force is required to displace the femur from its socket. A hip dislocation is an emergency. If you believe you have dislocated your hip and cannot move your leg, call for help immediately. Do not try to move. °CAUSES °The most common cause of hip dislocation is motor vehicle accidents. However, force from falls from a height (a ladder or building), injuries from contact sports, or injuries from industrial accidents can be enough to dislocate your hip. °SYMPTOMS °A hip dislocation is very painful. If you have a dislocated hip, you will not be able to move your hip. If you have nerve damage, you may not have feeling in your lower leg, foot, or ankle.  °DIAGNOSIS °Usually, your caregiver can diagnose a hip dislocation by looking at the position of your leg. Generally, X-ray exams are done to check for fractures in your femur or pelvis. The leg of the dislocated hip will appear shorter than the other leg, and your foot will be turned inward. °TREATMENT  °Your caregiver can manipulate your bones back into the joint (reduction). If there are no other complications involved with your dislocation, such as fractures or damage to blood vessels or nerves, this procedure can be done without surgery. Before this procedure, you will be given medicine so that you will not feel pain (anesthetic). Often specialized imaging exams are done after the reduction (magnetic resonance imaging [MRI] or computed tomography [CT]) to check for loose pieces of cartilage or bone in the joint. °If a manual reduction fails or you have nerve damage, damage to your blood vessels, or bone fractures, surgery will be necessary to perform the reduction.  °HOME CARE  INSTRUCTIONS °The following measures can help to reduce pain and speed up the healing process: °· Rest your injured joint. Do not move your joint if it is painful. Also, avoid activities similar to the one that caused your injury. °· Apply ice to your injured joint for 1 to 2 days after your reduction or as directed by your caregiver. Applying ice helps to reduce inflammation and pain. °¨ Put ice in a plastic bag. °¨ Place a towel between your skin and the bag. °¨ Leave the ice on for 15 to 20 minutes at a time, every 2 hours while you are awake. °· Use crutches or a walker as directed by your caregiver. °· Exercise your hip and leg as directed by your caregiver. °· Take over-the-counter or prescription medicine for pain as directed by your caregiver. °SEEK IMMEDIATE MEDICAL CARE IF: °· Your pain becomes worse rather than better. °· You feel like your hip has become dislocated again. °MAKE SURE YOU: °· Understand these instructions. °· Will watch your condition. °· Will get help right away if you are not doing well or get worse. °Document Released: 11/24/2000 Document Revised: 05/24/2011 Document Reviewed: 07/30/2010 °[employer]® Patient Information ©2015 [employer]. This information is not intended to replace advice given to you by your health care provider. Make sure you discuss any questions you have with your health care provider. ° °

## 2014-01-26 NOTE — ED Notes (Signed)
Bed: WA06 Expected date:  Expected time:  Means of arrival:  Comments: EMS 

## 2014-01-26 NOTE — ED Notes (Signed)
Patient transported to X-ray 

## 2014-01-30 DIAGNOSIS — M9901 Segmental and somatic dysfunction of cervical region: Secondary | ICD-10-CM | POA: Diagnosis not present

## 2014-01-30 DIAGNOSIS — M9902 Segmental and somatic dysfunction of thoracic region: Secondary | ICD-10-CM | POA: Diagnosis not present

## 2014-01-30 DIAGNOSIS — M9903 Segmental and somatic dysfunction of lumbar region: Secondary | ICD-10-CM | POA: Diagnosis not present

## 2014-02-12 DIAGNOSIS — M9901 Segmental and somatic dysfunction of cervical region: Secondary | ICD-10-CM | POA: Diagnosis not present

## 2014-02-12 DIAGNOSIS — M9902 Segmental and somatic dysfunction of thoracic region: Secondary | ICD-10-CM | POA: Diagnosis not present

## 2014-02-12 DIAGNOSIS — M9903 Segmental and somatic dysfunction of lumbar region: Secondary | ICD-10-CM | POA: Diagnosis not present

## 2014-02-13 DIAGNOSIS — T84021D Dislocation of internal left hip prosthesis, subsequent encounter: Secondary | ICD-10-CM | POA: Diagnosis not present

## 2014-02-13 DIAGNOSIS — Z96642 Presence of left artificial hip joint: Secondary | ICD-10-CM | POA: Diagnosis not present

## 2014-02-26 DIAGNOSIS — M9902 Segmental and somatic dysfunction of thoracic region: Secondary | ICD-10-CM | POA: Diagnosis not present

## 2014-02-26 DIAGNOSIS — M9901 Segmental and somatic dysfunction of cervical region: Secondary | ICD-10-CM | POA: Diagnosis not present

## 2014-02-26 DIAGNOSIS — M9903 Segmental and somatic dysfunction of lumbar region: Secondary | ICD-10-CM | POA: Diagnosis not present

## 2014-02-28 DIAGNOSIS — T84021D Dislocation of internal left hip prosthesis, subsequent encounter: Secondary | ICD-10-CM | POA: Diagnosis not present

## 2014-03-12 ENCOUNTER — Other Ambulatory Visit: Payer: Self-pay | Admitting: Interventional Cardiology

## 2014-03-19 DIAGNOSIS — M9901 Segmental and somatic dysfunction of cervical region: Secondary | ICD-10-CM | POA: Diagnosis not present

## 2014-03-19 DIAGNOSIS — M9903 Segmental and somatic dysfunction of lumbar region: Secondary | ICD-10-CM | POA: Diagnosis not present

## 2014-03-19 DIAGNOSIS — M9902 Segmental and somatic dysfunction of thoracic region: Secondary | ICD-10-CM | POA: Diagnosis not present

## 2014-03-21 DIAGNOSIS — T84021D Dislocation of internal left hip prosthesis, subsequent encounter: Secondary | ICD-10-CM | POA: Diagnosis not present

## 2014-03-26 DIAGNOSIS — T84021D Dislocation of internal left hip prosthesis, subsequent encounter: Secondary | ICD-10-CM | POA: Diagnosis not present

## 2014-03-28 ENCOUNTER — Encounter (HOSPITAL_COMMUNITY): Payer: Self-pay | Admitting: Orthopedic Surgery

## 2014-03-29 DIAGNOSIS — T84021D Dislocation of internal left hip prosthesis, subsequent encounter: Secondary | ICD-10-CM | POA: Diagnosis not present

## 2014-04-01 DIAGNOSIS — T84021D Dislocation of internal left hip prosthesis, subsequent encounter: Secondary | ICD-10-CM | POA: Diagnosis not present

## 2014-04-02 DIAGNOSIS — M9901 Segmental and somatic dysfunction of cervical region: Secondary | ICD-10-CM | POA: Diagnosis not present

## 2014-04-02 DIAGNOSIS — M9903 Segmental and somatic dysfunction of lumbar region: Secondary | ICD-10-CM | POA: Diagnosis not present

## 2014-04-02 DIAGNOSIS — M9902 Segmental and somatic dysfunction of thoracic region: Secondary | ICD-10-CM | POA: Diagnosis not present

## 2014-04-09 DIAGNOSIS — T84021D Dislocation of internal left hip prosthesis, subsequent encounter: Secondary | ICD-10-CM | POA: Diagnosis not present

## 2014-04-15 DIAGNOSIS — T84021D Dislocation of internal left hip prosthesis, subsequent encounter: Secondary | ICD-10-CM | POA: Diagnosis not present

## 2014-04-16 DIAGNOSIS — M9901 Segmental and somatic dysfunction of cervical region: Secondary | ICD-10-CM | POA: Diagnosis not present

## 2014-04-16 DIAGNOSIS — M9903 Segmental and somatic dysfunction of lumbar region: Secondary | ICD-10-CM | POA: Diagnosis not present

## 2014-04-16 DIAGNOSIS — M9902 Segmental and somatic dysfunction of thoracic region: Secondary | ICD-10-CM | POA: Diagnosis not present

## 2014-04-18 DIAGNOSIS — T84021D Dislocation of internal left hip prosthesis, subsequent encounter: Secondary | ICD-10-CM | POA: Diagnosis not present

## 2014-04-23 DIAGNOSIS — T84021D Dislocation of internal left hip prosthesis, subsequent encounter: Secondary | ICD-10-CM | POA: Diagnosis not present

## 2014-04-25 DIAGNOSIS — T84021D Dislocation of internal left hip prosthesis, subsequent encounter: Secondary | ICD-10-CM | POA: Diagnosis not present

## 2014-04-30 DIAGNOSIS — M9902 Segmental and somatic dysfunction of thoracic region: Secondary | ICD-10-CM | POA: Diagnosis not present

## 2014-04-30 DIAGNOSIS — M9903 Segmental and somatic dysfunction of lumbar region: Secondary | ICD-10-CM | POA: Diagnosis not present

## 2014-04-30 DIAGNOSIS — M9901 Segmental and somatic dysfunction of cervical region: Secondary | ICD-10-CM | POA: Diagnosis not present

## 2014-05-05 ENCOUNTER — Other Ambulatory Visit: Payer: Self-pay | Admitting: Gynecology

## 2014-05-06 DIAGNOSIS — T84021D Dislocation of internal left hip prosthesis, subsequent encounter: Secondary | ICD-10-CM | POA: Diagnosis not present

## 2014-05-08 DIAGNOSIS — T84021D Dislocation of internal left hip prosthesis, subsequent encounter: Secondary | ICD-10-CM | POA: Diagnosis not present

## 2014-05-10 ENCOUNTER — Other Ambulatory Visit: Payer: Self-pay | Admitting: Gynecology

## 2014-05-10 NOTE — Telephone Encounter (Signed)
Last RGCE was in 01/2013. She was RGCE scheduled for 06/26/14 with you.

## 2014-05-14 ENCOUNTER — Other Ambulatory Visit: Payer: Self-pay | Admitting: Gynecology

## 2014-05-14 DIAGNOSIS — T84021D Dislocation of internal left hip prosthesis, subsequent encounter: Secondary | ICD-10-CM | POA: Diagnosis not present

## 2014-05-14 DIAGNOSIS — M9903 Segmental and somatic dysfunction of lumbar region: Secondary | ICD-10-CM | POA: Diagnosis not present

## 2014-05-14 DIAGNOSIS — M9902 Segmental and somatic dysfunction of thoracic region: Secondary | ICD-10-CM | POA: Diagnosis not present

## 2014-05-14 DIAGNOSIS — M9901 Segmental and somatic dysfunction of cervical region: Secondary | ICD-10-CM | POA: Diagnosis not present

## 2014-05-16 DIAGNOSIS — Q828 Other specified congenital malformations of skin: Secondary | ICD-10-CM | POA: Diagnosis not present

## 2014-05-16 DIAGNOSIS — T84021D Dislocation of internal left hip prosthesis, subsequent encounter: Secondary | ICD-10-CM | POA: Diagnosis not present

## 2014-05-17 ENCOUNTER — Telehealth: Payer: Self-pay | Admitting: *Deleted

## 2014-05-17 NOTE — Telephone Encounter (Signed)
(  pt aware you are out of the office) Pt has annual scheduled on 06/26/14 requesting refill on premarin 0.3 mg. Pt was due in Nov. 2015. Okay to fill?

## 2014-05-19 NOTE — Telephone Encounter (Signed)
Ok to refill though scheduled exam

## 2014-05-20 NOTE — Telephone Encounter (Signed)
I called to let pt know the below and pt said pharmacy filled medication for her already which will last her until annual in April.

## 2014-05-21 DIAGNOSIS — T84021D Dislocation of internal left hip prosthesis, subsequent encounter: Secondary | ICD-10-CM | POA: Diagnosis not present

## 2014-05-23 DIAGNOSIS — T84021D Dislocation of internal left hip prosthesis, subsequent encounter: Secondary | ICD-10-CM | POA: Diagnosis not present

## 2014-05-27 DIAGNOSIS — C44722 Squamous cell carcinoma of skin of right lower limb, including hip: Secondary | ICD-10-CM | POA: Diagnosis not present

## 2014-05-27 DIAGNOSIS — L720 Epidermal cyst: Secondary | ICD-10-CM | POA: Diagnosis not present

## 2014-05-27 DIAGNOSIS — L820 Inflamed seborrheic keratosis: Secondary | ICD-10-CM | POA: Diagnosis not present

## 2014-05-27 DIAGNOSIS — B070 Plantar wart: Secondary | ICD-10-CM | POA: Diagnosis not present

## 2014-05-27 DIAGNOSIS — L821 Other seborrheic keratosis: Secondary | ICD-10-CM | POA: Diagnosis not present

## 2014-05-27 DIAGNOSIS — Z85828 Personal history of other malignant neoplasm of skin: Secondary | ICD-10-CM | POA: Diagnosis not present

## 2014-05-30 DIAGNOSIS — H3531 Nonexudative age-related macular degeneration: Secondary | ICD-10-CM | POA: Diagnosis not present

## 2014-05-30 DIAGNOSIS — H26491 Other secondary cataract, right eye: Secondary | ICD-10-CM | POA: Diagnosis not present

## 2014-05-30 DIAGNOSIS — Z961 Presence of intraocular lens: Secondary | ICD-10-CM | POA: Diagnosis not present

## 2014-06-04 DIAGNOSIS — M9902 Segmental and somatic dysfunction of thoracic region: Secondary | ICD-10-CM | POA: Diagnosis not present

## 2014-06-04 DIAGNOSIS — M9903 Segmental and somatic dysfunction of lumbar region: Secondary | ICD-10-CM | POA: Diagnosis not present

## 2014-06-04 DIAGNOSIS — M9901 Segmental and somatic dysfunction of cervical region: Secondary | ICD-10-CM | POA: Diagnosis not present

## 2014-06-18 DIAGNOSIS — M9901 Segmental and somatic dysfunction of cervical region: Secondary | ICD-10-CM | POA: Diagnosis not present

## 2014-06-18 DIAGNOSIS — M9903 Segmental and somatic dysfunction of lumbar region: Secondary | ICD-10-CM | POA: Diagnosis not present

## 2014-06-18 DIAGNOSIS — M9902 Segmental and somatic dysfunction of thoracic region: Secondary | ICD-10-CM | POA: Diagnosis not present

## 2014-06-24 ENCOUNTER — Telehealth: Payer: Self-pay | Admitting: Interventional Cardiology

## 2014-06-24 NOTE — Telephone Encounter (Signed)
New message     Pt c/o BP issue: STAT if pt c/o blurred vision, one-sided weakness or slurred speech  1. What are your last 5 BP readings? 166/73, 187/79, 150/77, 158/74,159/75,147/73, 171/74, 186/80 2. Are you having any other symptoms (ex. Dizziness, headache, blurred vision, passed out)? no  3. What is your BP issue? bp seems to be high and not responding to medication

## 2014-06-24 NOTE — Telephone Encounter (Signed)
Follow up  Pt called states that she has just received two good readings   @ 1pm 161/76 @ 2:10pm 134/72 @ 2:45pm  127/71  It seems to her that it has evened out and she believes she will be ok. Pt states "you don't have to bother with calling her, she believes she will be ok".

## 2014-06-26 ENCOUNTER — Encounter: Payer: Self-pay | Admitting: Gynecology

## 2014-06-26 ENCOUNTER — Ambulatory Visit (INDEPENDENT_AMBULATORY_CARE_PROVIDER_SITE_OTHER): Payer: Medicare Other | Admitting: Gynecology

## 2014-06-26 VITALS — BP 124/74 | Ht 64.0 in | Wt 162.0 lb

## 2014-06-26 DIAGNOSIS — L84 Corns and callosities: Secondary | ICD-10-CM | POA: Diagnosis not present

## 2014-06-26 DIAGNOSIS — Z01419 Encounter for gynecological examination (general) (routine) without abnormal findings: Secondary | ICD-10-CM

## 2014-06-26 DIAGNOSIS — Z79890 Hormone replacement therapy (postmenopausal): Secondary | ICD-10-CM | POA: Diagnosis not present

## 2014-06-26 DIAGNOSIS — N952 Postmenopausal atrophic vaginitis: Secondary | ICD-10-CM

## 2014-06-26 DIAGNOSIS — Z85828 Personal history of other malignant neoplasm of skin: Secondary | ICD-10-CM | POA: Diagnosis not present

## 2014-06-26 DIAGNOSIS — B070 Plantar wart: Secondary | ICD-10-CM | POA: Diagnosis not present

## 2014-06-26 DIAGNOSIS — R8299 Other abnormal findings in urine: Secondary | ICD-10-CM | POA: Diagnosis not present

## 2014-06-26 MED ORDER — ESTROGENS CONJUGATED 0.3 MG PO TABS: 0.3000 mg | ORAL_TABLET | Freq: Every day | ORAL | Status: DC

## 2014-06-26 NOTE — Patient Instructions (Signed)
You may obtain a copy of any labs that were done today by logging onto MyChart as outlined in the instructions provided with your AVS (after visit summary). The office will not call with normal lab results but certainly if there are any significant abnormalities then we will contact you.   Health Maintenance, Female A healthy lifestyle and preventative care can promote health and wellness.  Maintain regular health, dental, and eye exams.  Eat a healthy diet. Foods like vegetables, fruits, whole grains, low-fat dairy products, and lean protein foods contain the nutrients you need without too many calories. Decrease your intake of foods high in solid fats, added sugars, and salt. Get information about a proper diet from your caregiver, if necessary.  Regular physical exercise is one of the most important things you can do for your health. Most adults should get at least 150 minutes of moderate-intensity exercise (any activity that increases your heart rate and causes you to sweat) each week. In addition, most adults need muscle-strengthening exercises on 2 or more days a week.   Maintain a healthy weight. The body mass index (BMI) is a screening tool to identify possible weight problems. It provides an estimate of body fat based on height and weight. Your caregiver can help determine your BMI, and can help you achieve or maintain a healthy weight. For adults 20 years and older:  A BMI below 18.5 is considered underweight.  A BMI of 18.5 to 24.9 is normal.  A BMI of 25 to 29.9 is considered overweight.  A BMI of 30 and above is considered obese.  Maintain normal blood lipids and cholesterol by exercising and minimizing your intake of saturated fat. Eat a balanced diet with plenty of fruits and vegetables. Blood tests for lipids and cholesterol should begin at age 61 and be repeated every 5 years. If your lipid or cholesterol levels are high, you are over 50, or you are a high risk for heart  disease, you may need your cholesterol levels checked more frequently.Ongoing high lipid and cholesterol levels should be treated with medicines if diet and exercise are not effective.  If you smoke, find out from your caregiver how to quit. If you do not use tobacco, do not start.  Lung cancer screening is recommended for adults aged 33 80 years who are at high risk for developing lung cancer because of a history of smoking. Yearly low-dose computed tomography (CT) is recommended for people who have at least a 30-pack-year history of smoking and are a current smoker or have quit within the past 15 years. A pack year of smoking is smoking an average of 1 pack of cigarettes a day for 1 year (for example: 1 pack a day for 30 years or 2 packs a day for 15 years). Yearly screening should continue until the smoker has stopped smoking for at least 15 years. Yearly screening should also be stopped for people who develop a health problem that would prevent them from having lung cancer treatment.  If you are pregnant, do not drink alcohol. If you are breastfeeding, be very cautious about drinking alcohol. If you are not pregnant and choose to drink alcohol, do not exceed 1 drink per day. One drink is considered to be 12 ounces (355 mL) of beer, 5 ounces (148 mL) of wine, or 1.5 ounces (44 mL) of liquor.  Avoid use of street drugs. Do not share needles with anyone. Ask for help if you need support or instructions about stopping  the use of drugs.  High blood pressure causes heart disease and increases the risk of stroke. Blood pressure should be checked at least every 1 to 2 years. Ongoing high blood pressure should be treated with medicines, if weight loss and exercise are not effective.  If you are 59 to 79 years old, ask your caregiver if you should take aspirin to prevent strokes.  Diabetes screening involves taking a blood sample to check your fasting blood sugar level. This should be done once every 3  years, after age 91, if you are within normal weight and without risk factors for diabetes. Testing should be considered at a younger age or be carried out more frequently if you are overweight and have at least 1 risk factor for diabetes.  Breast cancer screening is essential preventative care for women. You should practice "breast self-awareness." This means understanding the normal appearance and feel of your breasts and may include breast self-examination. Any changes detected, no matter how small, should be reported to a caregiver. Women in their 66s and 30s should have a clinical breast exam (CBE) by a caregiver as part of a regular health exam every 1 to 3 years. After age 101, women should have a CBE every year. Starting at age 100, women should consider having a mammogram (breast X-ray) every year. Women who have a family history of breast cancer should talk to their caregiver about genetic screening. Women at a high risk of breast cancer should talk to their caregiver about having an MRI and a mammogram every year.  Breast cancer gene (BRCA)-related cancer risk assessment is recommended for women who have family members with BRCA-related cancers. BRCA-related cancers include breast, ovarian, tubal, and peritoneal cancers. Having family members with these cancers may be associated with an increased risk for harmful changes (mutations) in the breast cancer genes BRCA1 and BRCA2. Results of the assessment will determine the need for genetic counseling and BRCA1 and BRCA2 testing.  The Pap test is a screening test for cervical cancer. Women should have a Pap test starting at age 57. Between ages 25 and 35, Pap tests should be repeated every 2 years. Beginning at age 37, you should have a Pap test every 3 years as long as the past 3 Pap tests have been normal. If you had a hysterectomy for a problem that was not cancer or a condition that could lead to cancer, then you no longer need Pap tests. If you are  between ages 50 and 76, and you have had normal Pap tests going back 10 years, you no longer need Pap tests. If you have had past treatment for cervical cancer or a condition that could lead to cancer, you need Pap tests and screening for cancer for at least 20 years after your treatment. If Pap tests have been discontinued, risk factors (such as a new sexual partner) need to be reassessed to determine if screening should be resumed. Some women have medical problems that increase the chance of getting cervical cancer. In these cases, your caregiver may recommend more frequent screening and Pap tests.  The human papillomavirus (HPV) test is an additional test that may be used for cervical cancer screening. The HPV test looks for the virus that can cause the cell changes on the cervix. The cells collected during the Pap test can be tested for HPV. The HPV test could be used to screen women aged 44 years and older, and should be used in women of any age  who have unclear Pap test results. After the age of 55, women should have HPV testing at the same frequency as a Pap test.  Colorectal cancer can be detected and often prevented. Most routine colorectal cancer screening begins at the age of 44 and continues through age 20. However, your caregiver may recommend screening at an earlier age if you have risk factors for colon cancer. On a yearly basis, your caregiver may provide home test kits to check for hidden blood in the stool. Use of a small camera at the end of a tube, to directly examine the colon (sigmoidoscopy or colonoscopy), can detect the earliest forms of colorectal cancer. Talk to your caregiver about this at age 86, when routine screening begins. Direct examination of the colon should be repeated every 5 to 10 years through age 13, unless early forms of pre-cancerous polyps or small growths are found.  Hepatitis C blood testing is recommended for all people born from [birth year] through 1963 and any  individual with known risks for hepatitis C.  Practice safe sex. Use condoms and avoid high-risk sexual practices to reduce the spread of sexually transmitted infections (STIs). Sexually active women aged 36 and younger should be checked for Chlamydia, which is a common sexually transmitted infection. Older women with new or multiple partners should also be tested for Chlamydia. Testing for other STIs is recommended if you are sexually active and at increased risk.  Osteoporosis is a disease in which the bones lose minerals and strength with aging. This can result in serious bone fractures. The risk of osteoporosis can be identified using a bone density scan. Women ages 20 and over and women at risk for fractures or osteoporosis should discuss screening with their caregivers. Ask your caregiver whether you should be taking a calcium supplement or vitamin D to reduce the rate of osteoporosis.  Menopause can be associated with physical symptoms and risks. Hormone replacement therapy is available to decrease symptoms and risks. You should talk to your caregiver about whether hormone replacement therapy is right for you.  Use sunscreen. Apply sunscreen liberally and repeatedly throughout the day. You should seek shade when your shadow is shorter than you. Protect yourself by wearing long sleeves, pants, a wide-brimmed hat, and sunglasses year round, whenever you are outdoors.  Notify your caregiver of new moles or changes in moles, especially if there is a change in shape or color. Also notify your caregiver if a mole is larger than the size of a pencil eraser.  Stay current with your immunizations. Document Released: 09/14/2010 Document Revised: 06/26/2012 Document Reviewed: 09/14/2010 [facility] Patient Information 2014 [employer].

## 2014-06-26 NOTE — Progress Notes (Signed)
[name] May 08, 1929 [id number]        81 y.o.  G2P2 for breast and pelvic exam. Several issues noted below.  Past medical history,surgical history, problem list, medications, allergies, family history and social history were all reviewed and documented as reviewed in the EPIC chart.  ROS:  Performed with pertinent positives and negatives included in the history, assessment and plan.   Additional significant findings :  none   Exam: Kim Counsellor Vitals:   06/26/14 1441  BP: 124/74  Height: 5\' 4"  (1.626 m)  Weight: 162 lb (73.483 kg)   General appearance:  Normal affect, orientation and appearance. Skin: Grossly normal HEENT: Without gross lesions.  No cervical or supraclavicular adenopathy. Thyroid normal.  Lungs:  Clear without wheezing, rales or rhonchi Cardiac: RR, without RMG Abdominal:  Soft, nontender, without masses, guarding, rebound, organomegaly or hernia Breasts:  Examined lying and sitting without masses, retractions, discharge or axillary adenopathy. Pelvic:  Ext/BUS/vagina with generalized atrophic changes  Adnexa  Without masses or tenderness    Anus and perineum  Normal   Rectovaginal  Normal sphincter tone without palpated masses or tenderness.    Assessment/Plan:  81 y.o. G2P2 female for breast and pelvic exam.   1. Postmenopausal/ERT.  Status post TVH on Premarin 0.3 mg. Once to continue. Has tried stopping in the past with unacceptable side effects. I again reviewed the whole issue of HRT, the WHI study and increased risks of stroke heart attack DVT and breast cancer. Possible increasing risk of stroke particularly with aging population using HRT. ACOG and NAMS statements for lowest dose for shortest period of time reviewed. After a lengthy discussion to include unusual for patients to use HRT in their 80s patient clearly understands the risks benefits and from a quality of life issue wants to continue. I refilled her 1 year. 2. Mammography  07/2013. Continue with annual mammography. SBE monthly reviewed. 3. Pap smear 2012. No Pap smear done today. He both agree to stop screening per current screening guidelines given her age and hysterectomy for benign indications. 4. Colonoscopy 8-9 years ago. She will check to see if they want to continue doing this and arrange follow up if needed. 5. DEXA 2014 normal. Increased calcium vitamin D reviewed. 6. Health maintenance. No routine blood work done as this is done at her primary physician's office. Follow up in one year, sooner as needed.     Anastasio Auerbach MD, 3:29 PM 06/26/2014

## 2014-06-27 ENCOUNTER — Other Ambulatory Visit: Payer: Self-pay

## 2014-06-27 DIAGNOSIS — Z1231 Encounter for screening mammogram for malignant neoplasm of breast: Secondary | ICD-10-CM

## 2014-06-27 LAB — URINALYSIS W MICROSCOPIC + REFLEX CULTURE
Bilirubin Urine: NEGATIVE
Casts: NONE SEEN
Crystals: NONE SEEN
Glucose, UA: NEGATIVE mg/dL
Hgb urine dipstick: NEGATIVE
Ketones, ur: NEGATIVE mg/dL
Nitrite: NEGATIVE
Protein, ur: NEGATIVE mg/dL
Specific Gravity, Urine: 1.016 (ref 1.005–1.030)
Squamous Epithelial / LPF: NONE SEEN
Urobilinogen, UA: 0.2 mg/dL (ref 0.0–1.0)
pH: 5 (ref 5.0–8.0)

## 2014-06-28 ENCOUNTER — Other Ambulatory Visit: Payer: Self-pay | Admitting: Gynecology

## 2014-06-28 ENCOUNTER — Telehealth: Payer: Self-pay | Admitting: *Deleted

## 2014-06-28 DIAGNOSIS — Z780 Asymptomatic menopausal state: Secondary | ICD-10-CM

## 2014-06-28 MED ORDER — NITROFURANTOIN MONOHYD MACRO 100 MG PO CAPS: 100.0000 mg | ORAL_CAPSULE | Freq: Two times a day (BID) | ORAL | Status: DC

## 2014-06-28 NOTE — Telephone Encounter (Signed)
Pt called requesting breast order to be placed at breast center this was done.

## 2014-06-29 LAB — URINE CULTURE: Colony Count: >=100000

## 2014-07-02 DIAGNOSIS — M9903 Segmental and somatic dysfunction of lumbar region: Secondary | ICD-10-CM | POA: Diagnosis not present

## 2014-07-02 DIAGNOSIS — M9901 Segmental and somatic dysfunction of cervical region: Secondary | ICD-10-CM | POA: Diagnosis not present

## 2014-07-02 DIAGNOSIS — M9902 Segmental and somatic dysfunction of thoracic region: Secondary | ICD-10-CM | POA: Diagnosis not present

## 2014-07-03 ENCOUNTER — Telehealth: Payer: Self-pay | Admitting: *Deleted

## 2014-07-03 DIAGNOSIS — N390 Urinary tract infection, site not specified: Secondary | ICD-10-CM

## 2014-07-03 NOTE — Telephone Encounter (Signed)
Pt was treated for Macrobid 100 mg x 7 days asked if she could have a recheck done to confirm infection is gone. Order placed pt will be in on 07/08/14 @ 3:00pm front desk will be informed

## 2014-07-08 ENCOUNTER — Other Ambulatory Visit: Payer: Medicare Other

## 2014-07-08 DIAGNOSIS — N390 Urinary tract infection, site not specified: Secondary | ICD-10-CM

## 2014-07-09 LAB — URINALYSIS W MICROSCOPIC + REFLEX CULTURE
Bilirubin Urine: NEGATIVE
Casts: NONE SEEN
Crystals: NONE SEEN
Glucose, UA: NEGATIVE mg/dL
Hgb urine dipstick: NEGATIVE
Ketones, ur: NEGATIVE mg/dL
Nitrite: POSITIVE — AB
Protein, ur: NEGATIVE mg/dL
Specific Gravity, Urine: 1.023 (ref 1.005–1.030)
Squamous Epithelial / LPF: NONE SEEN
Urobilinogen, UA: 0.2 mg/dL (ref 0.0–1.0)
pH: 5.5 (ref 5.0–8.0)

## 2014-07-11 LAB — URINE CULTURE: Colony Count: >=100000

## 2014-07-16 DIAGNOSIS — M9903 Segmental and somatic dysfunction of lumbar region: Secondary | ICD-10-CM | POA: Diagnosis not present

## 2014-07-16 DIAGNOSIS — M9902 Segmental and somatic dysfunction of thoracic region: Secondary | ICD-10-CM | POA: Diagnosis not present

## 2014-07-16 DIAGNOSIS — M9901 Segmental and somatic dysfunction of cervical region: Secondary | ICD-10-CM | POA: Diagnosis not present

## 2014-07-17 ENCOUNTER — Telehealth: Payer: Self-pay | Admitting: Interventional Cardiology

## 2014-07-17 ENCOUNTER — Other Ambulatory Visit: Payer: Self-pay | Admitting: Gynecology

## 2014-07-17 DIAGNOSIS — I10 Essential (primary) hypertension: Secondary | ICD-10-CM

## 2014-07-17 MED ORDER — VALSARTAN 160 MG PO TABS: 160.0000 mg | ORAL_TABLET | Freq: Every day | ORAL | Status: DC

## 2014-07-17 MED ORDER — AMOXICILLIN-POT CLAVULANATE 500-125 MG PO TABS: 1.0000 | ORAL_TABLET | Freq: Two times a day (BID) | ORAL | Status: DC

## 2014-07-17 NOTE — Telephone Encounter (Signed)
Spoke with patient who reports her BP is dropping low in the afternoon over the past couple of days.  She reports she feels weak and only wants to sleep but has things she needs to get done and cannot rest every afternoon.  She denies dizziness or light-headedness, c/o feeling like she can't get a good breath and generalized weakness or fatigue.  Patient was evaluated 9/15 by Richardson Dopp, PA-C for hypertension and was advised to take Diovan 160 mg BID.  Patient reports she only takes this medication once daily and only if systolic BP > 701 mm/hg.  She reports she takes Carvedilol 12.5 mg 1/2 tab in the am and 1 tab in the pm and diltiazem 240 mg once daily. I have updated her medication list.  I advised patient to only take Carvedilol 1/2 tab (6.25 mg) tonight and that I will send message to Dr. Tamala Julian for advice.  I advised her that he is not in the office today and that it may be tomorrow before someone calls her back.  Patient verbalized understanding and agreement.

## 2014-07-17 NOTE — Telephone Encounter (Signed)
New message      Pt c/o BP issue: STAT if pt c/o blurred vision, one-sided weakness or slurred speech  1. What are your last 5 BP readings? 116/63, 101/59, 96/57, 97/62,   2. Are you having any other symptoms (ex. Dizziness, headache, blurred vision, passed out)?  Pt feels weak. 3. What is your BP issue?  Pt says her bp is too low and it makes her feel horrible

## 2014-07-22 NOTE — Telephone Encounter (Signed)
Is it safe to assume that the patient's blood pressures have improved since she has not called back? Please give me an update on how things are going

## 2014-07-23 NOTE — Telephone Encounter (Signed)
Called to ck on pt. She reports that she has been doing well, and has had no reoccurrence of weakness and low bp. She has been monitoring her bp daily. Her bp avg 130's/60's. She is taking her medications as prescribed. Adv pt to continue monitoring her bp, and to call the office if she has repeat low bp and is symptomatic. Pt verbalized understanding.

## 2014-07-29 ENCOUNTER — Other Ambulatory Visit: Payer: Self-pay | Admitting: Gynecology

## 2014-07-29 ENCOUNTER — Other Ambulatory Visit: Payer: Medicare Other

## 2014-07-29 DIAGNOSIS — N3000 Acute cystitis without hematuria: Secondary | ICD-10-CM

## 2014-07-30 DIAGNOSIS — Z96642 Presence of left artificial hip joint: Secondary | ICD-10-CM | POA: Diagnosis not present

## 2014-07-30 DIAGNOSIS — Z471 Aftercare following joint replacement surgery: Secondary | ICD-10-CM | POA: Diagnosis not present

## 2014-07-30 LAB — URINALYSIS W MICROSCOPIC + REFLEX CULTURE
Bacteria, UA: NONE SEEN
Bilirubin Urine: NEGATIVE
Casts: NONE SEEN
Crystals: NONE SEEN
Glucose, UA: NEGATIVE mg/dL
Hgb urine dipstick: NEGATIVE
Ketones, ur: NEGATIVE mg/dL
Leukocytes, UA: NEGATIVE
Nitrite: NEGATIVE
Protein, ur: NEGATIVE mg/dL
Specific Gravity, Urine: 1.017 (ref 1.005–1.030)
Squamous Epithelial / LPF: NONE SEEN
Urobilinogen, UA: 0.2 mg/dL (ref 0.0–1.0)
pH: 5.5 (ref 5.0–8.0)

## 2014-07-31 ENCOUNTER — Telehealth: Payer: Self-pay | Admitting: *Deleted

## 2014-07-31 DIAGNOSIS — M9901 Segmental and somatic dysfunction of cervical region: Secondary | ICD-10-CM | POA: Diagnosis not present

## 2014-07-31 DIAGNOSIS — M9902 Segmental and somatic dysfunction of thoracic region: Secondary | ICD-10-CM | POA: Diagnosis not present

## 2014-07-31 DIAGNOSIS — M9903 Segmental and somatic dysfunction of lumbar region: Secondary | ICD-10-CM | POA: Diagnosis not present

## 2014-07-31 NOTE — Telephone Encounter (Signed)
Pt informed with normal urine results on 07/29/14

## 2014-08-01 ENCOUNTER — Other Ambulatory Visit: Payer: Self-pay | Admitting: Gynecology

## 2014-08-01 DIAGNOSIS — E2839 Other primary ovarian failure: Secondary | ICD-10-CM

## 2014-08-02 ENCOUNTER — Other Ambulatory Visit: Payer: Medicare Other

## 2014-08-02 ENCOUNTER — Ambulatory Visit
Admission: RE | Admit: 2014-08-02 | Discharge: 2014-08-02 | Disposition: A | Discharge status: Home / Self Care | Payer: Medicare Other | Source: Ambulatory Visit | Attending: Gynecology | Admitting: Gynecology

## 2014-08-02 ENCOUNTER — Ambulatory Visit: Payer: Medicare Other

## 2014-08-02 ENCOUNTER — Inpatient Hospital Stay: Admission: RE | Admit: 2014-08-02 | Payer: Medicare Other | Source: Ambulatory Visit

## 2014-08-02 ENCOUNTER — Ambulatory Visit
Admission: RE | Admit: 2014-08-02 | Discharge: 2014-08-02 | Disposition: A | Discharge status: Home / Self Care | Payer: Medicare Other | Source: Ambulatory Visit

## 2014-08-02 DIAGNOSIS — Z13820 Encounter for screening for osteoporosis: Secondary | ICD-10-CM | POA: Diagnosis not present

## 2014-08-02 DIAGNOSIS — Z780 Asymptomatic menopausal state: Secondary | ICD-10-CM | POA: Diagnosis not present

## 2014-08-02 DIAGNOSIS — E2839 Other primary ovarian failure: Secondary | ICD-10-CM

## 2014-08-02 DIAGNOSIS — Z1231 Encounter for screening mammogram for malignant neoplasm of breast: Secondary | ICD-10-CM | POA: Diagnosis not present

## 2014-08-14 DIAGNOSIS — M9903 Segmental and somatic dysfunction of lumbar region: Secondary | ICD-10-CM | POA: Diagnosis not present

## 2014-08-14 DIAGNOSIS — M9901 Segmental and somatic dysfunction of cervical region: Secondary | ICD-10-CM | POA: Diagnosis not present

## 2014-08-14 DIAGNOSIS — M9902 Segmental and somatic dysfunction of thoracic region: Secondary | ICD-10-CM | POA: Diagnosis not present

## 2014-08-15 ENCOUNTER — Ambulatory Visit: Payer: Medicare Other

## 2014-08-15 ENCOUNTER — Other Ambulatory Visit: Payer: Medicare Other

## 2014-08-28 ENCOUNTER — Other Ambulatory Visit: Payer: Self-pay | Admitting: Gynecology

## 2014-08-28 DIAGNOSIS — M9902 Segmental and somatic dysfunction of thoracic region: Secondary | ICD-10-CM | POA: Diagnosis not present

## 2014-08-28 DIAGNOSIS — M9903 Segmental and somatic dysfunction of lumbar region: Secondary | ICD-10-CM | POA: Diagnosis not present

## 2014-08-28 DIAGNOSIS — M9901 Segmental and somatic dysfunction of cervical region: Secondary | ICD-10-CM | POA: Diagnosis not present

## 2014-09-11 DIAGNOSIS — L821 Other seborrheic keratosis: Secondary | ICD-10-CM | POA: Diagnosis not present

## 2014-09-11 DIAGNOSIS — Z85828 Personal history of other malignant neoplasm of skin: Secondary | ICD-10-CM | POA: Diagnosis not present

## 2014-09-11 DIAGNOSIS — L400 Psoriasis vulgaris: Secondary | ICD-10-CM | POA: Diagnosis not present

## 2014-09-11 DIAGNOSIS — L905 Scar conditions and fibrosis of skin: Secondary | ICD-10-CM | POA: Diagnosis not present

## 2014-09-11 DIAGNOSIS — B070 Plantar wart: Secondary | ICD-10-CM | POA: Diagnosis not present

## 2014-09-25 DIAGNOSIS — M9901 Segmental and somatic dysfunction of cervical region: Secondary | ICD-10-CM | POA: Diagnosis not present

## 2014-09-25 DIAGNOSIS — M9903 Segmental and somatic dysfunction of lumbar region: Secondary | ICD-10-CM | POA: Diagnosis not present

## 2014-09-25 DIAGNOSIS — M9902 Segmental and somatic dysfunction of thoracic region: Secondary | ICD-10-CM | POA: Diagnosis not present

## 2014-10-03 DIAGNOSIS — E782 Mixed hyperlipidemia: Secondary | ICD-10-CM | POA: Diagnosis not present

## 2014-10-03 DIAGNOSIS — I471 Supraventricular tachycardia: Secondary | ICD-10-CM | POA: Diagnosis not present

## 2014-10-03 DIAGNOSIS — H3530 Unspecified macular degeneration: Secondary | ICD-10-CM | POA: Diagnosis not present

## 2014-10-03 DIAGNOSIS — I10 Essential (primary) hypertension: Secondary | ICD-10-CM | POA: Diagnosis not present

## 2014-10-03 DIAGNOSIS — Z0000 Encounter for general adult medical examination without abnormal findings: Secondary | ICD-10-CM | POA: Diagnosis not present

## 2014-10-03 DIAGNOSIS — Z1389 Encounter for screening for other disorder: Secondary | ICD-10-CM | POA: Diagnosis not present

## 2014-10-03 DIAGNOSIS — I712 Thoracic aortic aneurysm, without rupture: Secondary | ICD-10-CM | POA: Diagnosis not present

## 2014-10-03 DIAGNOSIS — D649 Anemia, unspecified: Secondary | ICD-10-CM | POA: Diagnosis not present

## 2014-10-09 DIAGNOSIS — M9902 Segmental and somatic dysfunction of thoracic region: Secondary | ICD-10-CM | POA: Diagnosis not present

## 2014-10-09 DIAGNOSIS — M9903 Segmental and somatic dysfunction of lumbar region: Secondary | ICD-10-CM | POA: Diagnosis not present

## 2014-10-09 DIAGNOSIS — M9901 Segmental and somatic dysfunction of cervical region: Secondary | ICD-10-CM | POA: Diagnosis not present

## 2014-10-14 DIAGNOSIS — Z85828 Personal history of other malignant neoplasm of skin: Secondary | ICD-10-CM | POA: Diagnosis not present

## 2014-10-14 DIAGNOSIS — C44729 Squamous cell carcinoma of skin of left lower limb, including hip: Secondary | ICD-10-CM | POA: Diagnosis not present

## 2014-10-23 DIAGNOSIS — M9903 Segmental and somatic dysfunction of lumbar region: Secondary | ICD-10-CM | POA: Diagnosis not present

## 2014-10-23 DIAGNOSIS — M9901 Segmental and somatic dysfunction of cervical region: Secondary | ICD-10-CM | POA: Diagnosis not present

## 2014-10-23 DIAGNOSIS — M9902 Segmental and somatic dysfunction of thoracic region: Secondary | ICD-10-CM | POA: Diagnosis not present

## 2014-10-25 DIAGNOSIS — Z96642 Presence of left artificial hip joint: Secondary | ICD-10-CM | POA: Diagnosis not present

## 2014-10-25 DIAGNOSIS — Z471 Aftercare following joint replacement surgery: Secondary | ICD-10-CM | POA: Diagnosis not present

## 2014-10-25 DIAGNOSIS — M7072 Other bursitis of hip, left hip: Secondary | ICD-10-CM | POA: Diagnosis not present

## 2014-11-12 DIAGNOSIS — Z85828 Personal history of other malignant neoplasm of skin: Secondary | ICD-10-CM | POA: Diagnosis not present

## 2014-11-12 DIAGNOSIS — C44729 Squamous cell carcinoma of skin of left lower limb, including hip: Secondary | ICD-10-CM | POA: Diagnosis not present

## 2014-11-20 ENCOUNTER — Emergency Department (HOSPITAL_COMMUNITY)
Admission: EM | Admit: 2014-11-20 | Discharge: 2014-11-20 | Disposition: A | Discharge status: Home / Self Care | Payer: No Typology Code available for payment source | Attending: Emergency Medicine | Admitting: Emergency Medicine

## 2014-11-20 ENCOUNTER — Emergency Department (HOSPITAL_COMMUNITY): Payer: No Typology Code available for payment source

## 2014-11-20 ENCOUNTER — Encounter (HOSPITAL_COMMUNITY): Payer: Self-pay | Admitting: Family Medicine

## 2014-11-20 DIAGNOSIS — Y998 Other external cause status: Secondary | ICD-10-CM | POA: Diagnosis not present

## 2014-11-20 DIAGNOSIS — N189 Chronic kidney disease, unspecified: Secondary | ICD-10-CM | POA: Insufficient documentation

## 2014-11-20 DIAGNOSIS — S8002XA Contusion of left knee, initial encounter: Secondary | ICD-10-CM | POA: Diagnosis not present

## 2014-11-20 DIAGNOSIS — R079 Chest pain, unspecified: Secondary | ICD-10-CM | POA: Diagnosis not present

## 2014-11-20 DIAGNOSIS — Z79899 Other long term (current) drug therapy: Secondary | ICD-10-CM | POA: Diagnosis not present

## 2014-11-20 DIAGNOSIS — Z862 Personal history of diseases of the blood and blood-forming organs and certain disorders involving the immune mechanism: Secondary | ICD-10-CM | POA: Diagnosis not present

## 2014-11-20 DIAGNOSIS — Z7982 Long term (current) use of aspirin: Secondary | ICD-10-CM | POA: Insufficient documentation

## 2014-11-20 DIAGNOSIS — R52 Pain, unspecified: Secondary | ICD-10-CM

## 2014-11-20 DIAGNOSIS — R0789 Other chest pain: Secondary | ICD-10-CM | POA: Diagnosis not present

## 2014-11-20 DIAGNOSIS — R011 Cardiac murmur, unspecified: Secondary | ICD-10-CM | POA: Insufficient documentation

## 2014-11-20 DIAGNOSIS — Y92410 Unspecified street and highway as the place of occurrence of the external cause: Secondary | ICD-10-CM | POA: Insufficient documentation

## 2014-11-20 DIAGNOSIS — I129 Hypertensive chronic kidney disease with stage 1 through stage 4 chronic kidney disease, or unspecified chronic kidney disease: Secondary | ICD-10-CM | POA: Insufficient documentation

## 2014-11-20 DIAGNOSIS — S8012XA Contusion of left lower leg, initial encounter: Secondary | ICD-10-CM | POA: Diagnosis not present

## 2014-11-20 DIAGNOSIS — S8992XA Unspecified injury of left lower leg, initial encounter: Secondary | ICD-10-CM | POA: Diagnosis present

## 2014-11-20 DIAGNOSIS — Z85828 Personal history of other malignant neoplasm of skin: Secondary | ICD-10-CM | POA: Diagnosis not present

## 2014-11-20 DIAGNOSIS — S279XXA Injury of unspecified intrathoracic organ, initial encounter: Secondary | ICD-10-CM | POA: Diagnosis not present

## 2014-11-20 DIAGNOSIS — Y9389 Activity, other specified: Secondary | ICD-10-CM | POA: Insufficient documentation

## 2014-11-20 DIAGNOSIS — Z87891 Personal history of nicotine dependence: Secondary | ICD-10-CM | POA: Diagnosis not present

## 2014-11-20 DIAGNOSIS — S29001A Unspecified injury of muscle and tendon of front wall of thorax, initial encounter: Secondary | ICD-10-CM | POA: Diagnosis not present

## 2014-11-20 DIAGNOSIS — M1990 Unspecified osteoarthritis, unspecified site: Secondary | ICD-10-CM | POA: Diagnosis not present

## 2014-11-20 LAB — CBC
HCT: 29.2 % — ABNORMAL LOW (ref 36.0–46.0)
Hemoglobin: 9.4 g/dL — ABNORMAL LOW (ref 12.0–15.0)
MCH: 27.7 pg (ref 26.0–34.0)
MCHC: 32.2 g/dL (ref 30.0–36.0)
MCV: 86.1 fL (ref 78.0–100.0)
Platelets: 263 10*3/uL (ref 150–400)
RBC: 3.39 MIL/uL — ABNORMAL LOW (ref 3.87–5.11)
RDW: 14 % (ref 11.5–15.5)
WBC: 11.8 10*3/uL — ABNORMAL HIGH (ref 4.0–10.5)

## 2014-11-20 MED ORDER — ACETAMINOPHEN 500 MG PO TABS
500.0000 mg | ORAL_TABLET | Freq: Once | ORAL | Status: AC
  Administered 2014-11-20: 500 mg via ORAL
  Filled 2014-11-20: qty 1
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
## 2014-11-25 DIAGNOSIS — M19041 Primary osteoarthritis, right hand: Secondary | ICD-10-CM | POA: Diagnosis not present

## 2014-11-25 DIAGNOSIS — M129 Arthropathy, unspecified: Secondary | ICD-10-CM | POA: Insufficient documentation

## 2014-11-25 DIAGNOSIS — T148 Other injury of unspecified body region: Secondary | ICD-10-CM | POA: Diagnosis not present

## 2014-11-25 DIAGNOSIS — T148XXA Other injury of unspecified body region, initial encounter: Secondary | ICD-10-CM | POA: Insufficient documentation

## 2014-11-26 DIAGNOSIS — Z471 Aftercare following joint replacement surgery: Secondary | ICD-10-CM | POA: Diagnosis not present

## 2014-11-26 DIAGNOSIS — S8012XD Contusion of left lower leg, subsequent encounter: Secondary | ICD-10-CM | POA: Diagnosis not present

## 2014-11-26 DIAGNOSIS — Z96642 Presence of left artificial hip joint: Secondary | ICD-10-CM | POA: Diagnosis not present

## 2014-12-03 DIAGNOSIS — M25562 Pain in left knee: Secondary | ICD-10-CM | POA: Diagnosis not present

## 2014-12-03 DIAGNOSIS — Z96652 Presence of left artificial knee joint: Secondary | ICD-10-CM | POA: Diagnosis not present

## 2014-12-03 DIAGNOSIS — Z471 Aftercare following joint replacement surgery: Secondary | ICD-10-CM | POA: Diagnosis not present

## 2014-12-04 DIAGNOSIS — I8312 Varicose veins of left lower extremity with inflammation: Secondary | ICD-10-CM | POA: Diagnosis not present

## 2014-12-04 DIAGNOSIS — T1490 Injury, unspecified: Secondary | ICD-10-CM | POA: Diagnosis not present

## 2014-12-04 DIAGNOSIS — Z23 Encounter for immunization: Secondary | ICD-10-CM | POA: Diagnosis not present

## 2014-12-04 DIAGNOSIS — Z85828 Personal history of other malignant neoplasm of skin: Secondary | ICD-10-CM | POA: Diagnosis not present

## 2014-12-09 DIAGNOSIS — Z85828 Personal history of other malignant neoplasm of skin: Secondary | ICD-10-CM | POA: Diagnosis not present

## 2014-12-09 DIAGNOSIS — T1490 Injury, unspecified: Secondary | ICD-10-CM | POA: Diagnosis not present

## 2014-12-30 DIAGNOSIS — Z96652 Presence of left artificial knee joint: Secondary | ICD-10-CM | POA: Diagnosis not present

## 2014-12-30 DIAGNOSIS — S8012XD Contusion of left lower leg, subsequent encounter: Secondary | ICD-10-CM | POA: Diagnosis not present

## 2015-02-11 DIAGNOSIS — M1811 Unilateral primary osteoarthritis of first carpometacarpal joint, right hand: Secondary | ICD-10-CM | POA: Diagnosis not present

## 2015-02-11 DIAGNOSIS — M19031 Primary osteoarthritis, right wrist: Secondary | ICD-10-CM | POA: Diagnosis not present

## 2015-02-11 DIAGNOSIS — M25531 Pain in right wrist: Secondary | ICD-10-CM | POA: Diagnosis not present

## 2015-02-13 DIAGNOSIS — Z96652 Presence of left artificial knee joint: Secondary | ICD-10-CM | POA: Diagnosis not present

## 2015-02-13 DIAGNOSIS — S8012XD Contusion of left lower leg, subsequent encounter: Secondary | ICD-10-CM | POA: Diagnosis not present

## 2015-02-13 DIAGNOSIS — Z471 Aftercare following joint replacement surgery: Secondary | ICD-10-CM | POA: Diagnosis not present

## 2015-02-14 ENCOUNTER — Ambulatory Visit: Payer: Medicare Other | Admitting: Interventional Cardiology

## 2015-02-17 DIAGNOSIS — M9902 Segmental and somatic dysfunction of thoracic region: Secondary | ICD-10-CM | POA: Diagnosis not present

## 2015-02-17 DIAGNOSIS — M9903 Segmental and somatic dysfunction of lumbar region: Secondary | ICD-10-CM | POA: Diagnosis not present

## 2015-02-17 DIAGNOSIS — M9901 Segmental and somatic dysfunction of cervical region: Secondary | ICD-10-CM | POA: Diagnosis not present

## 2015-03-04 ENCOUNTER — Other Ambulatory Visit: Payer: Self-pay | Admitting: Interventional Cardiology

## 2015-03-04 DIAGNOSIS — M9902 Segmental and somatic dysfunction of thoracic region: Secondary | ICD-10-CM | POA: Diagnosis not present

## 2015-03-04 DIAGNOSIS — M9903 Segmental and somatic dysfunction of lumbar region: Secondary | ICD-10-CM | POA: Diagnosis not present

## 2015-03-04 DIAGNOSIS — M9901 Segmental and somatic dysfunction of cervical region: Secondary | ICD-10-CM | POA: Diagnosis not present

## 2015-03-18 DIAGNOSIS — M9901 Segmental and somatic dysfunction of cervical region: Secondary | ICD-10-CM | POA: Diagnosis not present

## 2015-03-18 DIAGNOSIS — M1811 Unilateral primary osteoarthritis of first carpometacarpal joint, right hand: Secondary | ICD-10-CM | POA: Diagnosis not present

## 2015-03-18 DIAGNOSIS — M19031 Primary osteoarthritis, right wrist: Secondary | ICD-10-CM | POA: Diagnosis not present

## 2015-03-18 DIAGNOSIS — M9903 Segmental and somatic dysfunction of lumbar region: Secondary | ICD-10-CM | POA: Diagnosis not present

## 2015-03-18 DIAGNOSIS — M9902 Segmental and somatic dysfunction of thoracic region: Secondary | ICD-10-CM | POA: Diagnosis not present

## 2015-03-25 ENCOUNTER — Ambulatory Visit (INDEPENDENT_AMBULATORY_CARE_PROVIDER_SITE_OTHER): Payer: Medicare Other | Admitting: Interventional Cardiology

## 2015-03-25 ENCOUNTER — Encounter: Payer: Self-pay | Admitting: Interventional Cardiology

## 2015-03-25 VITALS — BP 134/76 | HR 63 | Ht 65.0 in | Wt 157.8 lb

## 2015-03-25 DIAGNOSIS — R002 Palpitations: Secondary | ICD-10-CM | POA: Diagnosis not present

## 2015-03-25 DIAGNOSIS — I351 Nonrheumatic aortic (valve) insufficiency: Secondary | ICD-10-CM

## 2015-03-25 DIAGNOSIS — I711 Thoracic aortic aneurysm, ruptured, unspecified: Secondary | ICD-10-CM

## 2015-03-25 DIAGNOSIS — I10 Essential (primary) hypertension: Secondary | ICD-10-CM

## 2015-03-25 NOTE — Patient Instructions (Signed)
Medication Instructions:  Your physician recommends that you continue on your current medications as directed. Please refer to the Current Medication list given to you today.   Labwork: None orderd  Testing/Procedures: None ordered  Follow-Up: Your physician wants you to follow-up in: 1 year with [name] You will receive a reminder letter in the mail two months in advance. If you don't receive a letter, please call our office to schedule the follow-up appointment.   Any Other Special Instructions Will Be Listed Below (If Applicable).     If you need a refill on your cardiac medications before your next appointment, please call your pharmacy.

## 2015-03-25 NOTE — Progress Notes (Signed)
Cardiology Office Note   Date:  03/25/2015   ID:  ADALINA DARCEY, DOB [birth year], MRN [id number]  PCP:  Kandice Hams, MD  Cardiologist:  Sinclair Grooms, MD   Chief Complaint  Patient presents with  . Hypertension      History of Present Illness: [name] is a 82 y.o. female who presents for ascending aortic aneurysm (4.6 cm in diameter), hypertension, mild aortic regurgitation, and history of paroxysmal SVT  Overall she is doing well. She denies syncope. She denies chest pain. She doesn't want to have any additional scans done with reference to her aorta. She denies orthopnea, PND, edema, palpitations, and syncope.    Past Medical History  Diagnosis Date  . Pericarditis     RECURRENT   . Hypertension   . Thoracic aneurysm     "STABLE ANEURYSMAL DISEASE OF THE ASCENDING THORACIC AORTA WITH MAXIMAL DIAMETER OF 4.6" PER MRI CHEST 12/13/11 -AND LAST OFFICE NOTE DR. BARTLE IN [facility] 12/14/2011  . Heart murmur     "MILD AORTIC INSUFFICIENCY" PER CARDIOLOGY OFFICE NOTE DR. Linard Millers 12/01/11  . Anemia     CHRONIC  . Dysrhythmia     HX OF PAROXYSMAL SUPRAVENTRICULAR TACHYCARDIA ONCE - PT STATES NO PROBLEM WITH RE-OCCURANCE  . Chronic kidney disease     PT TOLD "MILD KIDNEY DISEASE FROM ALL THE DYE USED FOR XRAYS"  . GERD (gastroesophageal reflux disease)   . Cancer ([place])     SKIN CANCERS REMOVED FROM LEGS  . Arthritis     OA-SOME BACK AND NECK PAIN,  GOES TO CHIROPRACTOR TWICE A MONTH;  HX OF JOINT REPLACEMENTS   . Anemia requiring transfusions     POST OPERATIVELY    Past Surgical History  Procedure Laterality Date  . Appendectomy    . Total knee arthroplasty      right  . Revision total hip arthroplasty      left  . Vaginal hysterectomy      with ovarian cyst removal  . Pelvic laparoscopy      ovarian cyst removal,   . Oophorectomy      has a partial of one ovary remaingin  . Right achilles tendon repair  5/13  . Total knee arthroplasty     left  . Joint replacement      LEFT TOTAL HIP REPLACEMENT AND REVISIONS X 2  . Excision/release bursa hip Left 11/24/2012    Procedure: LEFT HIP BURSECTOMY AND TENDON REPAIR ;  Surgeon: Gearlean Alf, MD;  Location: [facility];  Service: Orthopedics;  Laterality: Left;  . Hip closed reduction Left 12/19/2012    Procedure: CLOSED MANIPULATION HIP;  Surgeon: Mauri Pole, MD;  Location: [facility];  Service: Orthopedics;  Laterality: Left;  . Hip closed reduction Left 12/05/2013    Procedure: CLOSED MANIPULATION HIP;  Surgeon: Augustin Schooling, MD;  Location: [facility];  Service: Orthopedics;  Laterality: Left;     Current Outpatient Prescriptions  Medication Sig Dispense Refill  . aspirin 81 MG tablet Take 81 mg by mouth daily.    . Calcium Carbonate-Vit D-Min (CALCIUM 1200 PO) Take 1 tablet by mouth daily.     . carboxymethylcellulose (REFRESH TEARS) 0.5 % SOLN Place 1 drop into both eyes daily.     . carvedilol (COREG) 12.5 MG tablet Take 6.25-12.5 mg by mouth 2 (two) times daily. Takes 1/2 tablet in am and 1 tablet at night    . cholecalciferol (VITAMIN D) 1000 UNITS  tablet Take 1,000 Units by mouth daily.    . clobetasol cream (TEMOVATE) [id number] % Apply 1 application topically 2 (two) times daily as needed (itching).     Marland Kitchen diltiazem (CARDIZEM CD) 240 MG 24 hr capsule Take 240 mg by mouth daily.     Marland Kitchen estrogens, conjugated, (PREMARIN) 0.3 MG tablet Take 1 tablet (0.3 mg total) by mouth daily. 90 tablet 4  . meloxicam (MOBIC) 7.5 MG tablet Take 7.5 mg by mouth daily as needed. (PAIN)    . mupirocin ointment (BACTROBAN) 2 % Apply 1 application topically 2 (two) times daily. 30 g 0  . omega-3 acid ethyl esters (LOVAZA) 1 g capsule Take 2 g by mouth daily.    Marland Kitchen omeprazole (PRILOSEC) 20 MG capsule Take 20 mg by mouth daily.    . traMADol (ULTRAM) 50 MG tablet Take 50 mg by mouth every 6 (six) hours as needed for moderate pain.    . valsartan (DIOVAN) 160 MG tablet Take 160 mg by mouth daily as needed  (Systolic BP > [id number]).     No current facility-administered medications for this visit.    Allergies:   Clindamycin/lincomycin; Cephalexin; Ciprofloxacin; Keflex; Shellfish allergy; Sulfa antibiotics; and Sulfites    Social History:  The patient  reports that she has quit smoking. Her smoking use included Cigarettes. She has never used smokeless tobacco. She reports that she does not drink alcohol or use illicit drugs.   Family History:  The patient's family history includes Heart attack (age of onset: 38) in her father; Hypertension in her mother; Stroke in her mother.    ROS:  Please see the history of present illness.   Otherwise, review of systems are positive for denies syncope. Vitality has been good..   All other systems are reviewed and negative.    PHYSICAL EXAM: VS:  BP 134/76 mmHg  Pulse 63  Ht 5\' 5"  (1.651 m)  Wt 157 lb 12.8 oz (71.578 kg)  BMI 26.26 kg/m2 , BMI Body mass index is 26.26 kg/(m^2). GEN: Well nourished, well developed, in no acute distress HEENT: normal Neck: no JVD, carotid bruits, or masses Cardiac: RRR.  There is 1/6 decrescendo murmur of aortic regurgitation. No systolic murmur, rub, or gallop. There is no edema. Respiratory:  clear to auscultation bilaterally, normal work of breathing. GI: soft, nontender, nondistended, + BS MS: no deformity or atrophy Skin: warm and dry, no she's stable year mild aortic regurgitation are intact Psych: euthymic mood, full affect   EKG:  EKG is ordered today. The ekg reveals sinus rhythm, left atrial abnormality, otherwise normal.   Recent Labs: 11/20/2014: Hemoglobin 9.4*; Platelets 263    Lipid Panel No results found for: CHOL, TRIG, HDL, CHOLHDL, VLDL, LDLCALC, LDLDIRECT    Wt Readings from Last 3 Encounters:  03/25/15 157 lb 12.8 oz (71.578 kg)  06/26/14 162 lb (73.483 kg)  01/26/14 160 lb (72.576 kg)      Other studies Reviewed: Additional studies/ records that were reviewed today include: medical  record and prior CV surgery note.. The findings include.    ASSESSMENT AND PLAN:  1. Aortic regurgitation Mild aortic regurgitation  2. Essential hypertension Stable  3. Ruptured aneurysm of thoracic aorta (HCC) No increase in size on medical therapy  4. Palpitations No complaints    Current medicines are reviewed at length with the patient today.  The patient has the following concerns regarding medicines: No changes made.  The following changes/actions have been instituted:    Continue to  monitor blood pressures at home. Call for consistent pressures greater than 140/90.  Labs/ tests ordered today include:  No orders of the defined types were placed in this encounter.     Disposition:   FU with HS in 1 year  Signed, Sinclair Grooms, MD  03/25/2015 2:34 PM    [address], [place], [place]  [address] Phone: [phone]; Fax: [phone]

## 2015-03-31 DIAGNOSIS — M9902 Segmental and somatic dysfunction of thoracic region: Secondary | ICD-10-CM | POA: Diagnosis not present

## 2015-03-31 DIAGNOSIS — M9903 Segmental and somatic dysfunction of lumbar region: Secondary | ICD-10-CM | POA: Diagnosis not present

## 2015-03-31 DIAGNOSIS — M9901 Segmental and somatic dysfunction of cervical region: Secondary | ICD-10-CM | POA: Diagnosis not present

## 2015-04-02 DIAGNOSIS — M9902 Segmental and somatic dysfunction of thoracic region: Secondary | ICD-10-CM | POA: Diagnosis not present

## 2015-04-02 DIAGNOSIS — M9901 Segmental and somatic dysfunction of cervical region: Secondary | ICD-10-CM | POA: Diagnosis not present

## 2015-04-02 DIAGNOSIS — M9903 Segmental and somatic dysfunction of lumbar region: Secondary | ICD-10-CM | POA: Diagnosis not present

## 2015-04-03 DIAGNOSIS — M9901 Segmental and somatic dysfunction of cervical region: Secondary | ICD-10-CM | POA: Diagnosis not present

## 2015-04-03 DIAGNOSIS — M9902 Segmental and somatic dysfunction of thoracic region: Secondary | ICD-10-CM | POA: Diagnosis not present

## 2015-04-03 DIAGNOSIS — M9903 Segmental and somatic dysfunction of lumbar region: Secondary | ICD-10-CM | POA: Diagnosis not present

## 2015-04-07 DIAGNOSIS — M9901 Segmental and somatic dysfunction of cervical region: Secondary | ICD-10-CM | POA: Diagnosis not present

## 2015-04-07 DIAGNOSIS — M9903 Segmental and somatic dysfunction of lumbar region: Secondary | ICD-10-CM | POA: Diagnosis not present

## 2015-04-07 DIAGNOSIS — M9902 Segmental and somatic dysfunction of thoracic region: Secondary | ICD-10-CM | POA: Diagnosis not present

## 2015-04-09 DIAGNOSIS — M9903 Segmental and somatic dysfunction of lumbar region: Secondary | ICD-10-CM | POA: Diagnosis not present

## 2015-04-09 DIAGNOSIS — M9901 Segmental and somatic dysfunction of cervical region: Secondary | ICD-10-CM | POA: Diagnosis not present

## 2015-04-09 DIAGNOSIS — M9902 Segmental and somatic dysfunction of thoracic region: Secondary | ICD-10-CM | POA: Diagnosis not present

## 2015-04-11 DIAGNOSIS — Z85828 Personal history of other malignant neoplasm of skin: Secondary | ICD-10-CM | POA: Diagnosis not present

## 2015-04-11 DIAGNOSIS — I8311 Varicose veins of right lower extremity with inflammation: Secondary | ICD-10-CM | POA: Diagnosis not present

## 2015-04-11 DIAGNOSIS — C44729 Squamous cell carcinoma of skin of left lower limb, including hip: Secondary | ICD-10-CM | POA: Diagnosis not present

## 2015-04-11 DIAGNOSIS — I872 Venous insufficiency (chronic) (peripheral): Secondary | ICD-10-CM | POA: Diagnosis not present

## 2015-04-11 DIAGNOSIS — I8312 Varicose veins of left lower extremity with inflammation: Secondary | ICD-10-CM | POA: Diagnosis not present

## 2015-04-22 DIAGNOSIS — M1811 Unilateral primary osteoarthritis of first carpometacarpal joint, right hand: Secondary | ICD-10-CM | POA: Diagnosis not present

## 2015-04-23 DIAGNOSIS — M9901 Segmental and somatic dysfunction of cervical region: Secondary | ICD-10-CM | POA: Diagnosis not present

## 2015-04-23 DIAGNOSIS — M9903 Segmental and somatic dysfunction of lumbar region: Secondary | ICD-10-CM | POA: Diagnosis not present

## 2015-04-23 DIAGNOSIS — M9902 Segmental and somatic dysfunction of thoracic region: Secondary | ICD-10-CM | POA: Diagnosis not present

## 2015-05-07 DIAGNOSIS — M9901 Segmental and somatic dysfunction of cervical region: Secondary | ICD-10-CM | POA: Diagnosis not present

## 2015-05-07 DIAGNOSIS — M9902 Segmental and somatic dysfunction of thoracic region: Secondary | ICD-10-CM | POA: Diagnosis not present

## 2015-05-07 DIAGNOSIS — M9903 Segmental and somatic dysfunction of lumbar region: Secondary | ICD-10-CM | POA: Diagnosis not present

## 2015-05-09 DIAGNOSIS — L245 Irritant contact dermatitis due to other chemical products: Secondary | ICD-10-CM | POA: Diagnosis not present

## 2015-05-09 DIAGNOSIS — I8312 Varicose veins of left lower extremity with inflammation: Secondary | ICD-10-CM | POA: Diagnosis not present

## 2015-05-09 DIAGNOSIS — I872 Venous insufficiency (chronic) (peripheral): Secondary | ICD-10-CM | POA: Diagnosis not present

## 2015-05-09 DIAGNOSIS — L400 Psoriasis vulgaris: Secondary | ICD-10-CM | POA: Diagnosis not present

## 2015-05-09 DIAGNOSIS — I8311 Varicose veins of right lower extremity with inflammation: Secondary | ICD-10-CM | POA: Diagnosis not present

## 2015-05-09 DIAGNOSIS — Z85828 Personal history of other malignant neoplasm of skin: Secondary | ICD-10-CM | POA: Diagnosis not present

## 2015-05-16 ENCOUNTER — Other Ambulatory Visit: Payer: Self-pay | Admitting: Interventional Cardiology

## 2015-05-16 DIAGNOSIS — M25532 Pain in left wrist: Secondary | ICD-10-CM | POA: Insufficient documentation

## 2015-05-16 DIAGNOSIS — S62112A Displaced fracture of triquetrum [cuneiform] bone, left wrist, initial encounter for closed fracture: Secondary | ICD-10-CM | POA: Insufficient documentation

## 2015-05-16 DIAGNOSIS — S63502A Unspecified sprain of left wrist, initial encounter: Secondary | ICD-10-CM | POA: Diagnosis not present

## 2015-05-20 DIAGNOSIS — M9903 Segmental and somatic dysfunction of lumbar region: Secondary | ICD-10-CM | POA: Diagnosis not present

## 2015-05-20 DIAGNOSIS — M9902 Segmental and somatic dysfunction of thoracic region: Secondary | ICD-10-CM | POA: Diagnosis not present

## 2015-05-20 DIAGNOSIS — M9901 Segmental and somatic dysfunction of cervical region: Secondary | ICD-10-CM | POA: Diagnosis not present

## 2015-06-05 DIAGNOSIS — Z961 Presence of intraocular lens: Secondary | ICD-10-CM | POA: Diagnosis not present

## 2015-06-05 DIAGNOSIS — H353190 Nonexudative age-related macular degeneration, unspecified eye, stage unspecified: Secondary | ICD-10-CM | POA: Diagnosis not present

## 2015-06-05 DIAGNOSIS — H43813 Vitreous degeneration, bilateral: Secondary | ICD-10-CM | POA: Insufficient documentation

## 2015-06-05 DIAGNOSIS — H353112 Nonexudative age-related macular degeneration, right eye, intermediate dry stage: Secondary | ICD-10-CM | POA: Diagnosis not present

## 2015-06-05 DIAGNOSIS — H26491 Other secondary cataract, right eye: Secondary | ICD-10-CM | POA: Diagnosis not present

## 2015-06-17 DIAGNOSIS — M9903 Segmental and somatic dysfunction of lumbar region: Secondary | ICD-10-CM | POA: Diagnosis not present

## 2015-06-17 DIAGNOSIS — M9902 Segmental and somatic dysfunction of thoracic region: Secondary | ICD-10-CM | POA: Diagnosis not present

## 2015-06-17 DIAGNOSIS — M9901 Segmental and somatic dysfunction of cervical region: Secondary | ICD-10-CM | POA: Diagnosis not present

## 2015-07-01 ENCOUNTER — Other Ambulatory Visit: Payer: Self-pay | Admitting: Internal Medicine

## 2015-07-01 DIAGNOSIS — R112 Nausea with vomiting, unspecified: Secondary | ICD-10-CM | POA: Diagnosis not present

## 2015-07-01 DIAGNOSIS — K829 Disease of gallbladder, unspecified: Secondary | ICD-10-CM

## 2015-07-02 DIAGNOSIS — M9902 Segmental and somatic dysfunction of thoracic region: Secondary | ICD-10-CM | POA: Diagnosis not present

## 2015-07-02 DIAGNOSIS — M9901 Segmental and somatic dysfunction of cervical region: Secondary | ICD-10-CM | POA: Diagnosis not present

## 2015-07-02 DIAGNOSIS — M9903 Segmental and somatic dysfunction of lumbar region: Secondary | ICD-10-CM | POA: Diagnosis not present

## 2015-07-03 ENCOUNTER — Encounter: Payer: Medicare Other | Admitting: Gynecology

## 2015-07-03 ENCOUNTER — Ambulatory Visit
Admission: RE | Admit: 2015-07-03 | Discharge: 2015-07-03 | Disposition: A | Discharge status: Home / Self Care | Payer: Medicare Other | Source: Ambulatory Visit | Attending: Internal Medicine | Admitting: Internal Medicine

## 2015-07-03 DIAGNOSIS — R112 Nausea with vomiting, unspecified: Secondary | ICD-10-CM | POA: Diagnosis not present

## 2015-07-03 DIAGNOSIS — K829 Disease of gallbladder, unspecified: Secondary | ICD-10-CM

## 2015-07-08 DIAGNOSIS — K219 Gastro-esophageal reflux disease without esophagitis: Secondary | ICD-10-CM | POA: Diagnosis not present

## 2015-07-08 DIAGNOSIS — I10 Essential (primary) hypertension: Secondary | ICD-10-CM | POA: Diagnosis not present

## 2015-07-16 ENCOUNTER — Other Ambulatory Visit: Payer: Self-pay

## 2015-07-16 DIAGNOSIS — M9903 Segmental and somatic dysfunction of lumbar region: Secondary | ICD-10-CM | POA: Diagnosis not present

## 2015-07-16 DIAGNOSIS — M9901 Segmental and somatic dysfunction of cervical region: Secondary | ICD-10-CM | POA: Diagnosis not present

## 2015-07-16 DIAGNOSIS — M9902 Segmental and somatic dysfunction of thoracic region: Secondary | ICD-10-CM | POA: Diagnosis not present

## 2015-07-16 DIAGNOSIS — Z1231 Encounter for screening mammogram for malignant neoplasm of breast: Secondary | ICD-10-CM

## 2015-07-29 DIAGNOSIS — M9903 Segmental and somatic dysfunction of lumbar region: Secondary | ICD-10-CM | POA: Diagnosis not present

## 2015-07-29 DIAGNOSIS — M9902 Segmental and somatic dysfunction of thoracic region: Secondary | ICD-10-CM | POA: Diagnosis not present

## 2015-07-29 DIAGNOSIS — M9901 Segmental and somatic dysfunction of cervical region: Secondary | ICD-10-CM | POA: Diagnosis not present

## 2015-08-04 ENCOUNTER — Ambulatory Visit
Admission: RE | Admit: 2015-08-04 | Discharge: 2015-08-04 | Disposition: A | Discharge status: Home / Self Care | Payer: Medicare Other | Source: Ambulatory Visit

## 2015-08-04 DIAGNOSIS — Z1231 Encounter for screening mammogram for malignant neoplasm of breast: Secondary | ICD-10-CM | POA: Diagnosis not present

## 2015-08-11 IMAGING — DX DG HIP 1V*L*
1 series · 1 of 1 positions shown · non-contrast
Comparison: 12/05/2013

CLINICAL DATA: 83-year-old female with left hip dislocation, status
post reduction

EXAM:
LEFT HIP - 1 VIEW:

[pelvis ap]
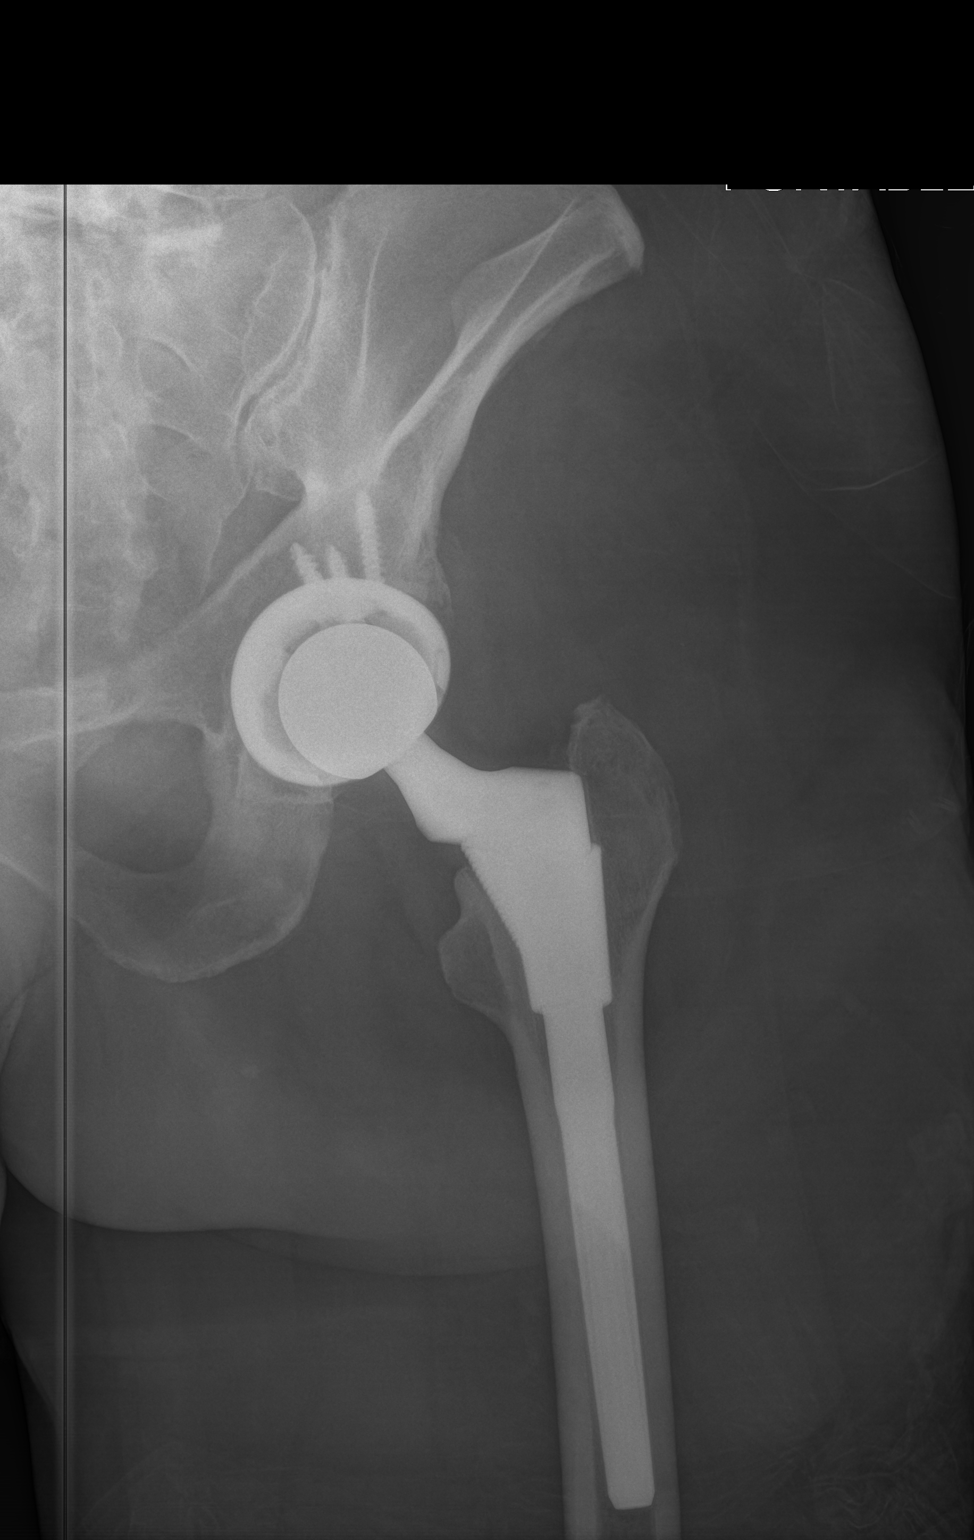

[1 of 1 positions shown; findings below may reference images not displayed]

FINDINGS: Single frontal view of the left hip.

Surgical changes of prior left-sided total hip arthroplasty again
noted. On this single frontal view, the femoral component projects
normally over the acetabular component, status post reduction.

No acute bony abnormality identified.
IMPRESSION: Femoral component projects normally over the acetabular component,
status post reduction of left hip dislocation.

Surgical changes of prior total left hip arthroplasty.

## 2015-08-19 DIAGNOSIS — M9901 Segmental and somatic dysfunction of cervical region: Secondary | ICD-10-CM | POA: Diagnosis not present

## 2015-08-19 DIAGNOSIS — R29898 Other symptoms and signs involving the musculoskeletal system: Secondary | ICD-10-CM | POA: Diagnosis not present

## 2015-08-19 DIAGNOSIS — M9902 Segmental and somatic dysfunction of thoracic region: Secondary | ICD-10-CM | POA: Diagnosis not present

## 2015-08-19 DIAGNOSIS — M9903 Segmental and somatic dysfunction of lumbar region: Secondary | ICD-10-CM | POA: Diagnosis not present

## 2015-08-21 ENCOUNTER — Ambulatory Visit (INDEPENDENT_AMBULATORY_CARE_PROVIDER_SITE_OTHER): Payer: Medicare Other | Admitting: Gynecology

## 2015-08-21 ENCOUNTER — Encounter: Payer: Self-pay | Admitting: Gynecology

## 2015-08-21 VITALS — BP 116/74 | Ht 65.0 in | Wt 154.0 lb

## 2015-08-21 DIAGNOSIS — N952 Postmenopausal atrophic vaginitis: Secondary | ICD-10-CM

## 2015-08-21 DIAGNOSIS — Z01419 Encounter for gynecological examination (general) (routine) without abnormal findings: Secondary | ICD-10-CM | POA: Diagnosis not present

## 2015-08-21 DIAGNOSIS — Z79890 Hormone replacement therapy (postmenopausal): Secondary | ICD-10-CM

## 2015-08-21 MED ORDER — ESTROGENS CONJUGATED 0.3 MG PO TABS: 0.3000 mg | ORAL_TABLET | Freq: Every day | ORAL | Status: DC

## 2015-08-21 NOTE — Patient Instructions (Signed)

## 2015-08-21 NOTE — Progress Notes (Signed)
    [name] Feb 21, 1930 [id number]        82 y.o.  G2P2  for breast and pelvic exam. Several issues noted below.  Past medical history,surgical history, problem list, medications, allergies, family history and social history were all reviewed and documented as reviewed in the EPIC chart.  ROS:  Performed with pertinent positives and negatives included in the history, assessment and plan.   Additional significant findings :  None   Exam: Caryn Bee assistant Filed Vitals:   08/21/15 1530  BP: 116/74  Height: 5\' 5"  (1.651 m)  Weight: 154 lb (69.854 kg)   General appearance:  Normal affect, orientation and appearance. Skin: Grossly normal HEENT: Without gross lesions.  No cervical or supraclavicular adenopathy. Thyroid normal.  Lungs:  Clear without wheezing, rales or rhonchi Cardiac: RR, without RMG Abdominal:  Soft, nontender, without masses, guarding, rebound, organomegaly or hernia Breasts:  Examined lying and sitting without masses, retractions, discharge or axillary adenopathy. Pelvic:  Ext/BUS/vagina with atrophic changes  Adnexa without masses or tenderness    Anus and perineum normal   Rectovaginal normal sphincter tone without palpated masses or tenderness.    Assessment/Plan:  82 y.o. G2P2 female for breast and pelvic exam.   1. Postmenopausal/atrophic genital changes.  Status post TVH in the past. Continues on Premarin 0.3 mg. Patient wants to continue. Has tried stopping with unacceptable side effects and just does not feel well. I reviewed the ACOG/NAMS statements and recommendations for lowest dose per shortest period of time. Increased risk of stroke heart attack DVT and possible breast cancer reviewed. Patient clearly understands the issues and strongly wants to continue I refilled 1 year. 2. Pap smear 2012. No Pap smear done today. We both agree to stop screening based on current screening guidelines based on age and hysterectomy history with no history  of abnormal Pap smears. 3. Colonoscopy 9 years ago. Will follow up with her primary physician as far as if they want to continue these based on her age. 4. DEXA 2016 normal. 5. Mammography lives/2017. Continue with annual mammography when due. SBE monthly reviewed. 6. Health maintenance. No routine lab work done as patient has this done elsewhere. Follow up 1 year, sooner as needed.   Anastasio Auerbach MD, 3:54 PM 08/21/2015

## 2015-08-28 ENCOUNTER — Telehealth: Payer: Self-pay | Admitting: Interventional Cardiology

## 2015-08-28 ENCOUNTER — Encounter: Payer: Self-pay | Admitting: Nurse Practitioner

## 2015-08-28 ENCOUNTER — Ambulatory Visit
Admission: RE | Admit: 2015-08-28 | Discharge: 2015-08-28 | Disposition: A | Discharge status: Home / Self Care | Payer: Medicare Other | Source: Ambulatory Visit | Attending: Nurse Practitioner | Admitting: Nurse Practitioner

## 2015-08-28 ENCOUNTER — Ambulatory Visit (INDEPENDENT_AMBULATORY_CARE_PROVIDER_SITE_OTHER): Payer: Medicare Other | Admitting: Nurse Practitioner

## 2015-08-28 VITALS — BP 118/66 | HR 61 | Ht 65.0 in | Wt 153.4 lb

## 2015-08-28 DIAGNOSIS — I309 Acute pericarditis, unspecified: Secondary | ICD-10-CM | POA: Insufficient documentation

## 2015-08-28 DIAGNOSIS — I351 Nonrheumatic aortic (valve) insufficiency: Secondary | ICD-10-CM

## 2015-08-28 DIAGNOSIS — N182 Chronic kidney disease, stage 2 (mild): Secondary | ICD-10-CM | POA: Diagnosis not present

## 2015-08-28 DIAGNOSIS — K219 Gastro-esophageal reflux disease without esophagitis: Secondary | ICD-10-CM

## 2015-08-28 DIAGNOSIS — R079 Chest pain, unspecified: Secondary | ICD-10-CM | POA: Diagnosis not present

## 2015-08-28 DIAGNOSIS — R0781 Pleurodynia: Secondary | ICD-10-CM

## 2015-08-28 DIAGNOSIS — I10 Essential (primary) hypertension: Secondary | ICD-10-CM

## 2015-08-28 DIAGNOSIS — I319 Disease of pericardium, unspecified: Secondary | ICD-10-CM

## 2015-08-28 LAB — CBC
HCT: 29.8 % — ABNORMAL LOW (ref 35.0–45.0)
Hemoglobin: 9.8 g/dL — ABNORMAL LOW (ref 11.7–15.5)
MCH: 27.7 pg (ref 27.0–33.0)
MCHC: 32.9 g/dL (ref 32.0–36.0)
MCV: 84.2 fL (ref 80.0–100.0)
MPV: 9.9 fL (ref 7.5–12.5)
Platelets: 247 10*3/uL (ref 140–400)
RBC: 3.54 MIL/uL — ABNORMAL LOW (ref 3.80–5.10)
RDW: 14.2 % (ref 11.0–15.0)
WBC: 7.7 10*3/uL (ref 3.8–10.8)

## 2015-08-28 NOTE — Telephone Encounter (Signed)
New Message  Pt call requesting to speak with RN. Pt states she was diagnosed with Pericardistis and is having chest pains from it. Pt wants to know what she can take to ease the pain. Please call back to discuss

## 2015-08-28 NOTE — Patient Instructions (Addendum)
Medication Instructions:  Your physician recommends that you continue on your current medications as directed. Please refer to the Current Medication list given to you today. 1.  START the Ibuprofen 200 (over the counter) taking 3 tablets every 6 hours 2.  START the Prilosec (over the counter) 20 mg taking 1 tablet daily  Labwork: TODAY:  BMET, CBC, & SED RATE  Testing/Procedures: Your physician has requested that you have an echocardiogram. ARRIVE AT [place], 08/29/15 AT 8:40 A.M.  Echocardiography is a painless test that uses sound waves to create images of your heart. It provides your doctor with information about the size and shape of your heart and how well your heart's chambers and valves are working. This procedure takes approximately one hour. There are no restrictions for this procedure.  A chest x-ray takes a picture of the organs and structures inside the chest, including the heart, lungs, and blood vessels. This test can show several things, including, whether the heart is enlarges; whether fluid is building up in the lungs; and whether pacemaker / defibrillator leads are still in place.   Follow-Up: Your physician recommends that you schedule a follow-up appointment in: 09/04/15 ARRIVE AT 11:15 A.M TO SEE DAYNA DUNN, PA-C AT THE [address] LOCATION   Any Other Special Instructions Will Be Listed Below (If Applicable).  Echocardiogram An echocardiogram, or echocardiography, uses sound waves (ultrasound) to produce an image of your heart. The echocardiogram is simple, painless, obtained within a short period of time, and offers valuable information to your health care provider. The images from an echocardiogram can provide information such as:  Evidence of coronary artery disease (CAD).  Heart size.  Heart muscle function.  Heart valve function.  Aneurysm detection.  Evidence of a past heart attack.  Fluid buildup around the heart.  Heart muscle  thickening.  Assess heart valve function. LET [facility] CARE PROVIDER KNOW ABOUT:  Any allergies you have.  All medicines you are taking, including vitamins, herbs, eye drops, creams, and over-the-counter medicines.  Previous problems you or members of your family have had with the use of anesthetics.  Any blood disorders you have.  Previous surgeries you have had.  Medical conditions you have.  Possibility of pregnancy, if this applies. BEFORE THE PROCEDURE  No special preparation is needed. Eat and drink normally.  PROCEDURE   In order to produce an image of your heart, gel will be applied to your chest and a wand-like tool (transducer) will be moved over your chest. The gel will help transmit the sound waves from the transducer. The sound waves will harmlessly bounce off your heart to allow the heart images to be captured in real-time motion. These images will then be recorded.  You may need an IV to receive a medicine that improves the quality of the pictures. AFTER THE PROCEDURE You may return to your normal schedule including diet, activities, and medicines, unless your health care provider tells you otherwise.   This information is not intended to replace advice given to you by your health care provider. Make sure you discuss any questions you have with your health care provider.   Document Released: 02/27/2000 Document Revised: 03/22/2014 Document Reviewed: 11/06/2012 Elsevier Interactive Patient Education 2016 [place] are tests that create pictures of the inside of your body using radiation. Different body parts absorb different amounts of radiation, which show up on the X-ray pictures in shades of black, gray, and white.  X-rays are  used to look for many health conditions, including broken bones, lung problems, and some types of cancer.  LET [facility] CARE PROVIDER KNOW ABOUT:  Any allergies you have.  All medicines you are taking,  including vitamins, herbs, eye drops, creams, and over-the-counter medicines.  Previous surgeries you have had.  Medical conditions you have. RISK AND COMPLICATIONS Getting an X-ray is a safe procedure.  BEFORE THE PROCEDURE  Tell the X-ray technician if you are pregnant or might be pregnant.  You may be asked to wear a protective lead apron to hide parts of your body from the X-ray.  You usually will need to undress whatever part of your body needs the X-ray. You will be given a hospital gown to wear, if needed.  You may need to remove your glasses, jewelry, and other metal objects. PROCEDURE   The X-ray machine creates a picture by using a tiny burst of radiation. It is painless.  You may need to have several pictures taken at different angles.  You will need to try to be as still as you can during the examination to get the best possible images. AFTER THE PROCEDURE  You will be able to resume your normal activities.  The X-ray will be examined by your health care provider or a radiology specialist.  It is your responsibility to get your test results. Ask your health care provider when to expect your results and how to get them.   This information is not intended to replace advice given to you by your health care provider. Make sure you discuss any questions you have with your health care provider.   Document Released: 03/01/2005 Document Revised: 03/22/2014 Document Reviewed: 04/25/2013 Elsevier Interactive Patient Education [employer].   If you need a refill on your cardiac medications before your next appointment, please call your pharmacy.

## 2015-08-28 NOTE — Progress Notes (Signed)
Office Visit    Patient Name: [name] Date of Encounter: 08/28/2015  Primary Care Provider:  Kandice Hams, MD Primary Cardiologist:  Linard Millers, MD   Chief Complaint    82 year old female with a history of recurrent pericarditis, hypertension, PSVT, GERD, stage II kidney disease, and thoracic aortic aneurysm, who presents with a one-day history of pleuritic chest discomfort.  Past Medical History    Past Medical History  Diagnosis Date  . Recurrent Pericarditis     RECURRENT   . Essential hypertension   . Thoracic aneurysm     "STABLE ANEURYSMAL DISEASE OF THE ASCENDING THORACIC AORTA WITH MAXIMAL DIAMETER OF 4.6" PER MRI CHEST 12/13/11 -AND LAST OFFICE NOTE DR. BARTLE IN [facility] 12/14/2011  . Heart murmur     "MILD AORTIC INSUFFICIENCY" PER CARDIOLOGY OFFICE NOTE DR. Linard Millers 12/01/11  . Anemia     CHRONIC  . PSVT (paroxysmal supraventricular tachycardia) ([place])   . CKD (chronic kidney disease), stage II   . GERD (gastroesophageal reflux disease)   . Cancer ([place])     SKIN CANCERS REMOVED FROM LEGS  . Arthritis     OA-SOME BACK AND NECK PAIN,  GOES TO CHIROPRACTOR TWICE A MONTH;  HX OF JOINT REPLACEMENTS   . Anemia requiring transfusions     POST OPERATIVELY  . Aortic insufficiency     a. 11/2010 EF 60-65%, mild conc LVH, Gr2 DD, Ao sclerosis, mild-mod AI, triv MR/PR/TR.   Past Surgical History  Procedure Laterality Date  . Appendectomy    . Total knee arthroplasty      right  . Revision total hip arthroplasty      left  . Vaginal hysterectomy      with ovarian cyst removal  . Pelvic laparoscopy      ovarian cyst removal,   . Oophorectomy      has a partial of one ovary remaingin  . Right achilles tendon repair  5/13  . Total knee arthroplasty      left  . Joint replacement      LEFT TOTAL HIP REPLACEMENT AND REVISIONS X 2  . Excision/release bursa hip Left 11/24/2012    Procedure: LEFT HIP BURSECTOMY AND TENDON REPAIR ;  Surgeon: Gearlean Alf, MD;   Location: [facility];  Service: Orthopedics;  Laterality: Left;  . Hip closed reduction Left 12/19/2012    Procedure: CLOSED MANIPULATION HIP;  Surgeon: Mauri Pole, MD;  Location: [facility];  Service: Orthopedics;  Laterality: Left;  . Hip closed reduction Left 12/05/2013    Procedure: CLOSED MANIPULATION HIP;  Surgeon: Augustin Schooling, MD;  Location: [facility];  Service: Orthopedics;  Laterality: Left;    Allergies  Allergies  Allergen Reactions  . Clindamycin/Lincomycin Rash  . Cephalexin     Stroke symptoms  . Ciprofloxacin     RASH  . Keflex [Cephalexin] Other (See Comments)    Pt states eye problems with this medicine  . Shellfish Allergy Hives  . Sulfa Antibiotics     Unknown   . Sulfites     History of Present Illness    82 year old female with the above complex past medical history including recurrent pericarditis dating back to the early 90s, mild to moderate aortic insufficiency, hypertension, stage II chronic kidney disease, GERD, and thoracic aortic aneurysm followed by thoracic surgery (last seen in 12/2013 w/ plan for f/u MRA and office f/u in 2 yrs). She was last seen by Dr. Tamala Julian in January, at which  time she was doing well. She is very active at home. Yesterday, she began to notice mild left-sided pleuritic chest discomfort that is worse with deep breathing. It is not necessarily worse with lying flat. She has had this discomfort before in the setting of pericarditis episodes and recognized it immediately as recurrent pericarditis. She has not taken anything for it. In the past, she says that if she let symptoms like this go on for little while they would progressively worsen and become associated with orthopnea. She has not had any dyspnea or orthopnea up to this point. Further, she denies any recent viral illness, fevers, chills, cough, congestion, PND, dizziness, syncope, edema, or early satiety.    Home Medications    Prior to Admission medications   Medication Sig Start  Date End Date Taking? Authorizing Provider  aspirin 81 MG tablet Take 81 mg by mouth daily.   Yes Historical Provider, MD  Calcium Carbonate-Vit D-Min (CALCIUM 1200 PO) Take 1 tablet by mouth daily.    Yes Historical Provider, MD  carboxymethylcellulose (REFRESH TEARS) 0.5 % SOLN Place 1 drop into both eyes daily.    Yes Historical Provider, MD  carvedilol (COREG) 12.5 MG tablet Take 6.25-12.5 mg by mouth 2 (two) times daily. Takes 1/2 tablet in am and 1 tablet at night   Yes Historical Provider, MD  cholecalciferol (VITAMIN D) 1000 UNITS tablet Take 1,000 Units by mouth daily.   Yes Historical Provider, MD  clobetasol cream (TEMOVATE) [id number] % Apply 1 application topically 2 (two) times daily as needed (itching).  08/08/13  Yes Historical Provider, MD  diltiazem (CARDIZEM CD) 240 MG 24 hr capsule Take 240 mg by mouth daily.  03/05/15  Yes Historical Provider, MD  estrogens, conjugated, (PREMARIN) 0.3 MG tablet Take 1 tablet (0.3 mg total) by mouth daily. 08/21/15  Yes Anastasio Auerbach, MD  meloxicam (MOBIC) 7.5 MG tablet Take 7.5 mg by mouth daily as needed. (PAIN) 03/21/15  Yes Historical Provider, MD  mupirocin ointment (BACTROBAN) 2 % Apply 1 application topically 2 (two) times daily. 12/08/13  Yes Liam Graham, PA-C  omega-3 acid ethyl esters (LOVAZA) 1 g capsule Take 2 g by mouth daily.   Yes Historical Provider, MD  valsartan (DIOVAN) 160 MG tablet Take 160 mg by mouth daily as needed (Systolic BP > [id number]).   Yes Historical Provider, MD    Review of Systems    As above, she has been having pleuritic chest discomfort for the past day and a half. There is no dyspnea, orthopnea, PND, fevers, chills him a congestion, cough, dizziness, syncope, edema, or early satiety.  All other systems reviewed and are otherwise negative except as noted above.  Physical Exam    VS:  BP 118/66 mmHg  Pulse 61  Ht 5\' 5"  (1.651 m)  Wt 153 lb 6.4 oz (69.582 kg)  BMI 25.53 kg/m2 , BMI Body mass index is 25.53  kg/(m^2).No pulsus paradoxus.  GEN: Well nourished, well developed, in no acute distress. HEENT: normal. Neck: Supple, no JVD, carotid bruits, or masses. Cardiac: RRR, no rubs, or gallops.  2/6 systolic murmur at the right upper sternal border.  No clubbing, cyanosis, edema.  Radials/DP/PT 2+ and equal bilaterally.  Respiratory:  Respirations regular and unlabored, clear to auscultation bilaterally. GI: Soft, nontender, nondistended, BS + x 4. MS: no deformity or atrophy. Skin: warm and dry, no rash. Neuro:  Strength and sensation are intact. Psych: Normal affect.  Accessory Clinical Findings    ECG -  Regular sinus rhythm, 61, probable left atrial enlargement, no acute ST or T changes.   Assessment & Plan    1.  Pleuritic chest discomfort/history of recurrent pericarditis: Patient presents with a day and a half history of left-sided pleuritic chest discomfort without associated dyspnea. It is not worse with position changes or palpation. She recognizes this discomfort as being similar to prior episodes of pericarditis. She has not had any recent viral illness, cough, congestion, fevers, or chills. She is neither hypoxic nor tachycardic and in fact is quite stable and in good spirits. She has no pulsus paradoxus or rub on exam. ECG is without ST elevation. I will check a chest x-ray to rule out any potential pulmonary cause of her symptoms along with a CBC, basic metabolic panel, and sedimentation rate. Given her history of pericarditis, it is reasonable to initiate ibuprofen 600 mg every 6 hours along with Prilosec 20 mg daily. I will not add colchicine at this point, as she says that ibuprofen alone was previously all that was necessary to treat flares. That said, at one point in the 90s, she did require prednisone. We have arranged for 2-D echocardiogram for tomorrow morning to evaluate LV function and further rule out pericardial effusion. She will need to be seen back in one week to reevaluate  her symptoms and determine whether or not we need to add colchicine to her therapy or change course altogether based on lab/imaging findings.  I prefer to limit exposure to high-dose ibuprofen to 2 weeks maximum. Of note, she takes meloxicam at home as needed and I have advised that she avoid this while using ibuprofen.  2.  Essential hypertension: Blood pressure is stable today on beta blocker, diltiazem, and valsartan therapy.  3. Stage II chronic kidney disease: As above, I will follow-up a basic metabolic panel today since we will using nonsteroidals.  4. GERD: She has previous been on Prilosec and weaned herself off of it recently. In the setting of NSAID therapy, I have asked her to resume this for the duration of NSAID therapy.  5. PSVT: No recent paroxysms or palpitations. Continue beta blocker and calcium channel blocker therapy.  6. Thoracic aortic aneurysm: Pending follow-up with thoracic surgery later this year with MRA at that time.  7. Mild to moderate aortic insufficiency: Follow-up echocardiogram as above.  8. Disposition: Follow-up labs and chest x-ray today. Follow-up echo tomorrow morning. She'll follow up in clinic in one week.   Murray Hodgkins, NP 08/28/2015, 3:58 PM

## 2015-08-28 NOTE — Telephone Encounter (Signed)
Returned pt call. Pt sts that she has been having intermittent chest discomfort most at night when she lies on her left side. Pt sts that she has a hx of pericarditis and the discomfort is similar to what she has had in the past. Nothing makes it better or worse. Pt sts that she is currently asymptomatic. Adv pt that the hx of percarditis is not listed on her dx list. The last echo in our system was in 2012. Pt also has a hx of aortic regurg. Adv pt that she would probably need an echo, adv pt that [name] is out of the office. I will talk with another provider and call back with their recommendation.   Discussed with Lucillie Garfinkel (flex) pt should be seen to day with an EKG, pt needs an echo scheduled asap. Per Gerald Stabs ok to add pt to his schedule today @ 2:30pm. Pt aware of appt with Gerald Stabs today @ 2:30pm. Adv pt that we will try to have her echo scheduled asap, and let her know the date and time when she comes in today for her visit.  Pt voiced appreciation for the call and verbalized understanding.  Pt echo scheduled for 6/16 @9am  @ [facility]

## 2015-08-29 ENCOUNTER — Ambulatory Visit (HOSPITAL_COMMUNITY)
Admission: RE | Admit: 2015-08-29 | Discharge: 2015-08-29 | Disposition: A | Discharge status: Home / Self Care | Payer: Medicare Other | Source: Ambulatory Visit | Attending: Nurse Practitioner | Admitting: Nurse Practitioner

## 2015-08-29 DIAGNOSIS — I071 Rheumatic tricuspid insufficiency: Secondary | ICD-10-CM | POA: Insufficient documentation

## 2015-08-29 DIAGNOSIS — N189 Chronic kidney disease, unspecified: Secondary | ICD-10-CM | POA: Diagnosis not present

## 2015-08-29 DIAGNOSIS — I319 Disease of pericardium, unspecified: Secondary | ICD-10-CM | POA: Diagnosis not present

## 2015-08-29 DIAGNOSIS — I351 Nonrheumatic aortic (valve) insufficiency: Secondary | ICD-10-CM | POA: Insufficient documentation

## 2015-08-29 DIAGNOSIS — I340 Nonrheumatic mitral (valve) insufficiency: Secondary | ICD-10-CM | POA: Diagnosis not present

## 2015-08-29 DIAGNOSIS — I1310 Hypertensive heart and chronic kidney disease without heart failure, with stage 1 through stage 4 chronic kidney disease, or unspecified chronic kidney disease: Secondary | ICD-10-CM | POA: Diagnosis not present

## 2015-08-29 LAB — ECHOCARDIOGRAM COMPLETE
E decel time: 215 msec
E/e' ratio: 16.11
FS: 29 % (ref 28–44)
IVS/LV PW RATIO, ED: 0.85
LA ID, A-P, ES: 31 mm
LA diam end sys: 31 mm
LA diam index: 1.75 cm/m2
LA vol A4C: 80.6 ml
LA vol index: 51.6 mL/m2
LA vol: 91.3 mL
LV E/e' medial: 16.11
LV E/e'average: 16.11
LV PW d: 13.6 mm — AB (ref 0.6–1.1)
LV e' LATERAL: 6.64 cm/s
LVOT area: 3.8 cm2
LVOT diameter: 22 mm
MV Dec: 215
MV Peak grad: 5 mmHg
MV pk A vel: 86.8 m/s
MV pk E vel: 107 m/s
P 1/2 time: 615 ms
Reg peak vel: 202 cm/s
TDI e' lateral: 6.64
TDI e' medial: 5.98
TR max vel: 202 cm/s

## 2015-08-29 LAB — SEDIMENTATION RATE: Sed Rate: 13 mm/hr (ref 0–30)

## 2015-08-29 LAB — BASIC METABOLIC PANEL
BUN: 30 mg/dL — ABNORMAL HIGH (ref 7–25)
CO2: 22 mmol/L (ref 20–31)
Calcium: 9 mg/dL (ref 8.6–10.4)
Chloride: 101 mmol/L (ref 98–110)
Creat: 1.12 mg/dL — ABNORMAL HIGH (ref 0.60–0.88)
Glucose, Bld: 87 mg/dL (ref 65–99)
Potassium: 4.8 mmol/L (ref 3.5–5.3)
Sodium: 134 mmol/L — ABNORMAL LOW (ref 135–146)

## 2015-08-29 NOTE — Progress Notes (Signed)
  Echocardiogram 2D Echocardiogram has been performed.  [name] 08/29/2015, 10:20 AM

## 2015-09-02 ENCOUNTER — Encounter: Payer: Self-pay | Admitting: Physician Assistant

## 2015-09-02 NOTE — Progress Notes (Signed)
Cardiology Office Note    Date:  09/04/2015  ID:  MARIUM RAGAN, DOB 1929/12/03, MRN [id number] PCP:  Kandice Hams, MD  Cardiologist:  Dr. Tamala Julian   Chief Complaint: f/u chest pain  History of Present Illness:  [name] is a 82 y.o. female with history of recurrent pericarditis, hypertension, PSVT, GERD, CKD stage II-III, mild AI, thoracic aortic aneurysm (patient declines further f/u), HTN, PSVT, chronic anemia who presents for f/u of chest pain. She was seen by Ignacia Bayley NP 08/28/15 for pleuritic-type chest pain, similar to prior episodes of pericarditis. EKG was without any ST elevation. She was placed on scheduled ibuprofen 630m q6hours and asked to hold her meloxicam in the meantime. She had recently weaned herself off PPI, and this was restarted in the setting of NSAID therapy. Labs notable for Na 134, Cr 1.12 (baseline appears 1-1.3), ESR normal, normal WBC, Hgb 9.8 c/w prior. 2D echo 08/29/15: EF 55-60%, mild LVH, diastolic dysfunction, elevated LV filling pressure, mild AI, severe LAE, mild RAE, mild TR, dilated descending thoracic aorta just distal to the takeoff of the left subclavian, measuring 4.1 cm, no pericardial effusion.  She returns for follow-up today feeling well. After 3 days of the ibuprofen she had complete relief of pain. It has not recurred. She is no longer taking it. She denies any dyspnea, LEE, orthopnea, palpitations or syncope. She is headed to exercise class after this appointment. She's had no limitation in her functional capability. She follows BP at home and it runs 1956-213systolic.   Past Medical History  Diagnosis Date  . Recurrent Pericarditis     RECURRENT   . Essential hypertension   . Thoracic aneurysm     a. Followed by [facility] -last seen in 12/2013 w/ plan for f/u MRA and thoracic surgery f/u in 2 yrs.  . Anemia     CHRONIC  . PSVT (paroxysmal supraventricular tachycardia) ([place]   . CKD (chronic kidney disease), stage II   . GERD  (gastroesophageal reflux disease)   . Cancer ([place]     SKIN CANCERS REMOVED FROM LEGS  . Arthritis     OA-SOME BACK AND NECK PAIN,  GOES TO CHIROPRACTOR TWICE A MONTH;  HX OF JOINT REPLACEMENTS   . Aortic insufficiency     a. 2D echo 08/29/15: EF 55-60%, mild LVH, diastolic dysfunction, elevated LV filling pressure, mild AI, severe LAE, mild RAE, mild TR, dilated descending thoracic aorta just distal to the takeoff of the left subclavian, measuring 4.1 cm, no pericardial effusion.    Past Surgical History  Procedure Laterality Date  . Appendectomy    . Total knee arthroplasty      right  . Revision total hip arthroplasty      left  . Vaginal hysterectomy      with ovarian cyst removal  . Pelvic laparoscopy      ovarian cyst removal,   . Oophorectomy      has a partial of one ovary remaingin  . Right achilles tendon repair  5/13  . Total knee arthroplasty      left  . Joint replacement      LEFT TOTAL HIP REPLACEMENT AND REVISIONS X 2  . Excision/release bursa hip Left 11/24/2012    Procedure: LEFT HIP BURSECTOMY AND TENDON REPAIR ;  Surgeon: FGearlean Alf MD;  Location: [facility];  Service: Orthopedics;  Laterality: Left;  . Hip closed reduction Left 12/19/2012    Procedure: CLOSED MANIPULATION HIP;  Surgeon:  Mauri Pole, MD;  Location: [facility];  Service: Orthopedics;  Laterality: Left;  . Hip closed reduction Left 12/05/2013    Procedure: CLOSED MANIPULATION HIP;  Surgeon: Augustin Schooling, MD;  Location: [facility];  Service: Orthopedics;  Laterality: Left;    Current Medications: Current Outpatient Prescriptions  Medication Sig Dispense Refill  . aspirin 81 MG tablet Take 81 mg by mouth daily.    . Calcium Carbonate-Vit D-Min (CALCIUM 1200 PO) Take 1 tablet by mouth daily.     . carboxymethylcellulose (REFRESH TEARS) 0.5 % SOLN Place 1 drop into both eyes daily.     . carvedilol (COREG) 12.5 MG tablet Take 6.25-12.5 mg by mouth 2 (two) times daily. Takes 1/2 tablet in am and 1  tablet at night    . cholecalciferol (VITAMIN D) 1000 UNITS tablet Take 1,000 Units by mouth daily.    . clobetasol cream (TEMOVATE) 7.84 % Apply 1 application topically 2 (two) times daily as needed (itching).     Marland Kitchen diltiazem (CARDIZEM CD) 240 MG 24 hr capsule Take 240 mg by mouth daily.     Marland Kitchen estrogens, conjugated, (PREMARIN) 0.3 MG tablet Take 1 tablet (0.3 mg total) by mouth daily. 90 tablet 4  . meloxicam (MOBIC) 7.5 MG tablet Take 7.5 mg by mouth daily as needed. (PAIN)    . mupirocin ointment (BACTROBAN) 2 % Apply 1 application topically 2 (two) times daily. 30 g 0  . omega-3 acid ethyl esters (LOVAZA) 1 g capsule Take 2 g by mouth daily.    . valsartan (DIOVAN) 160 MG tablet Take 160 mg by mouth daily as needed (Systolic BP > 696).     No current facility-administered medications for this visit.     Allergies:   Cephalexin; Keflex; Shellfish allergy; Clindamycin/lincomycin; Sulfites; Ciprofloxacin; and Sulfa antibiotics   Social History   Social History  . Marital Status: Married    Spouse Name: N/A  . Number of Children: N/A  . Years of Education: N/A   Social History Main Topics  . Smoking status: Former Smoker    Types: Cigarettes  . Smokeless tobacco: Never Used     Comment: quit age 19  . Alcohol Use: No  . Drug Use: No  . Sexual Activity: Not Currently    Birth Control/ Protection: Surgical     Comment: HYST-1st intercourse 82 yo-Fewer than 5 partners   Other Topics Concern  . None   Social History Narrative     Family History:  The patient's family history includes Heart attack (age of onset: 2) in her father; Hypertension in her mother; Stroke in her mother.   ROS:   Please see the history of present illness. All other systems are reviewed and otherwise negative.    PHYSICAL EXAM:   VS:  BP 142/60 mmHg  Pulse 59  Ht _0  (1.651 m)  Wt 156 lb 6.4 oz (70.943 kg)  BMI 26.03 kg/m2  BMI: Body mass index is 26.03 kg/(m^2). GEN: Well nourished, well  developed WF, in no acute distress HEENT: normocephalic, atraumatic Neck: no JVD, carotid bruits, or masses Cardiac: RRR; soft brief systolic murmur poorly localized, no rubs or gallops, no edema  Respiratory:  clear to auscultation bilaterally, normal work of breathing GI: soft, nontender, nondistended, + BS MS: no deformity or atrophy Skin: warm and dry, no rash Neuro:  Alert and Oriented x 3, Strength and sensation are intact, follows commands Psych: euthymic mood, full affect  Wt Readings from Last 3  Encounters:  09/04/15 156 lb 6.4 oz (70.943 kg)  08/28/15 153 lb 6.4 oz (69.582 kg)  08/21/15 154 lb (69.854 kg)      Studies/Labs Reviewed:   EKG:  EKG was ordered today and personally reviewed by me and demonstrates sinus bradycardia 59bpm, slightly peaked T waves -> previously seen on last EKG when potassium was normal.  Recent Labs: 08/28/2015: BUN 30*; Creat 1.12*; Hemoglobin 9.8*; Platelets 247; Potassium 4.8; Sodium 134*   Additional studies/ records that were reviewed today include: Summarized above.    ASSESSMENT & PLAN:   1. Pleuritic chest pain with history of recurrent pericarditis - resolved with brief course of ibuprofen. Inflammatory markers negative but per literature these can frequently be normal in the hyperacute phase of pericarditis. It's possible this could've also been costochondritis. Regardless, it has improved with anti-inflammatory therapy. Will hold off colchicine for now, reserving for refractory sx. No effusion seen on echo. 2. CKD stage II-III - she is no longer taking ibuprofen. She knows to limit habitual exposure to NSAIDs given her kidney disease. She says she only takes meloxicam PRN. 3. PSVT - quiescent on BB/CCB. 4. HTN - she follows BP at home and it has remained controlled. There was diastolic dysfunction seen on echo but she is not presently manifesting any symptoms. Given CKD and asymptomatic patient, will not add any diuretics at this time.  Continue to follow BP. 5. Thoracic aortic aneurysm - f/u planned 12/2015 with [facility], but patient does not wish to pursue any further imaging or workup for this.  Disposition: F/u with Dr. Tamala Julian in 6 months.   Medication Adjustments/Labs and Tests Ordered: Current medicines are reviewed at length with the patient today.  Concerns regarding medicines are outlined above. Medication changes, Labs and Tests ordered today are summarized above and listed in the Patient Instructions accessible in Encounters.   Raechel Ache PA-C  09/04/2015 11:54 AM    [address], [place], [place]  [address] Phone: [phone]; Fax: [phone]

## 2015-09-03 DIAGNOSIS — M9901 Segmental and somatic dysfunction of cervical region: Secondary | ICD-10-CM | POA: Diagnosis not present

## 2015-09-03 DIAGNOSIS — M9902 Segmental and somatic dysfunction of thoracic region: Secondary | ICD-10-CM | POA: Diagnosis not present

## 2015-09-03 DIAGNOSIS — M9903 Segmental and somatic dysfunction of lumbar region: Secondary | ICD-10-CM | POA: Diagnosis not present

## 2015-09-04 ENCOUNTER — Ambulatory Visit (INDEPENDENT_AMBULATORY_CARE_PROVIDER_SITE_OTHER): Payer: Medicare Other | Admitting: Physician Assistant

## 2015-09-04 ENCOUNTER — Encounter: Payer: Self-pay | Admitting: Physician Assistant

## 2015-09-04 VITALS — BP 142/60 | HR 59 | Ht 65.0 in | Wt 156.4 lb

## 2015-09-04 DIAGNOSIS — N182 Chronic kidney disease, stage 2 (mild): Secondary | ICD-10-CM

## 2015-09-04 DIAGNOSIS — I319 Disease of pericardium, unspecified: Secondary | ICD-10-CM | POA: Diagnosis not present

## 2015-09-04 DIAGNOSIS — I712 Thoracic aortic aneurysm, without rupture, unspecified: Secondary | ICD-10-CM

## 2015-09-04 DIAGNOSIS — I10 Essential (primary) hypertension: Secondary | ICD-10-CM

## 2015-09-04 DIAGNOSIS — R0781 Pleurodynia: Secondary | ICD-10-CM

## 2015-09-04 NOTE — Patient Instructions (Addendum)
Medication Instructions:  Your physician recommends that you continue on your current medications as directed. Please refer to the Current Medication list given to you today.   Labwork: None ordered  Testing/Procedures: None ordered  Follow-Up: Your physician wants you to follow-up in: 6 MONTHS WITH DR. SMITH You will receive a reminder letter in the mail two months in advance. If you don't receive a letter, please call our office to schedule the follow-up appointment.   Any Other Special Instructions Will Be Listed Below (If Applicable).     If you need a refill on your cardiac medications before your next appointment, please call your pharmacy.   

## 2015-09-11 DIAGNOSIS — Z96642 Presence of left artificial hip joint: Secondary | ICD-10-CM | POA: Diagnosis not present

## 2015-09-11 DIAGNOSIS — Z471 Aftercare following joint replacement surgery: Secondary | ICD-10-CM | POA: Diagnosis not present

## 2015-09-12 DIAGNOSIS — M2031 Hallux varus (acquired), right foot: Secondary | ICD-10-CM | POA: Diagnosis not present

## 2015-09-22 ENCOUNTER — Emergency Department (HOSPITAL_COMMUNITY): Payer: Medicare Other | Admitting: Certified Registered Nurse Anesthetist

## 2015-09-22 ENCOUNTER — Emergency Department (HOSPITAL_COMMUNITY): Payer: Medicare Other

## 2015-09-22 ENCOUNTER — Observation Stay (HOSPITAL_COMMUNITY)
Admission: EM | Admit: 2015-09-22 | Discharge: 2015-09-23 | Disposition: A | Discharge status: Home / Self Care | Payer: Medicare Other | Attending: Orthopedic Surgery | Admitting: Orthopedic Surgery

## 2015-09-22 ENCOUNTER — Encounter (HOSPITAL_COMMUNITY): Payer: Self-pay | Admitting: Emergency Medicine

## 2015-09-22 ENCOUNTER — Encounter (HOSPITAL_COMMUNITY)
Admission: EM | Disposition: A | Discharge status: Home / Self Care | Payer: Self-pay | Source: Home / Self Care | Attending: Emergency Medicine

## 2015-09-22 DIAGNOSIS — Z96653 Presence of artificial knee joint, bilateral: Secondary | ICD-10-CM | POA: Diagnosis not present

## 2015-09-22 DIAGNOSIS — D649 Anemia, unspecified: Secondary | ICD-10-CM | POA: Diagnosis present

## 2015-09-22 DIAGNOSIS — Z79890 Hormone replacement therapy (postmenopausal): Secondary | ICD-10-CM | POA: Insufficient documentation

## 2015-09-22 DIAGNOSIS — Y831 Surgical operation with implant of artificial internal device as the cause of abnormal reaction of the patient, or of later complication, without mention of misadventure at the time of the procedure: Secondary | ICD-10-CM | POA: Diagnosis not present

## 2015-09-22 DIAGNOSIS — Z471 Aftercare following joint replacement surgery: Secondary | ICD-10-CM | POA: Diagnosis not present

## 2015-09-22 DIAGNOSIS — Z79899 Other long term (current) drug therapy: Secondary | ICD-10-CM | POA: Insufficient documentation

## 2015-09-22 DIAGNOSIS — N182 Chronic kidney disease, stage 2 (mild): Secondary | ICD-10-CM | POA: Diagnosis not present

## 2015-09-22 DIAGNOSIS — Z87891 Personal history of nicotine dependence: Secondary | ICD-10-CM | POA: Diagnosis not present

## 2015-09-22 DIAGNOSIS — I351 Nonrheumatic aortic (valve) insufficiency: Secondary | ICD-10-CM | POA: Insufficient documentation

## 2015-09-22 DIAGNOSIS — I739 Peripheral vascular disease, unspecified: Secondary | ICD-10-CM | POA: Insufficient documentation

## 2015-09-22 DIAGNOSIS — T84021A Dislocation of internal left hip prosthesis, initial encounter: Secondary | ICD-10-CM | POA: Diagnosis not present

## 2015-09-22 DIAGNOSIS — Z7982 Long term (current) use of aspirin: Secondary | ICD-10-CM | POA: Insufficient documentation

## 2015-09-22 DIAGNOSIS — I129 Hypertensive chronic kidney disease with stage 1 through stage 4 chronic kidney disease, or unspecified chronic kidney disease: Secondary | ICD-10-CM | POA: Diagnosis not present

## 2015-09-22 DIAGNOSIS — Z85828 Personal history of other malignant neoplasm of skin: Secondary | ICD-10-CM | POA: Insufficient documentation

## 2015-09-22 DIAGNOSIS — M25551 Pain in right hip: Secondary | ICD-10-CM

## 2015-09-22 DIAGNOSIS — I712 Thoracic aortic aneurysm, without rupture, unspecified: Secondary | ICD-10-CM | POA: Diagnosis present

## 2015-09-22 DIAGNOSIS — S73005A Unspecified dislocation of left hip, initial encounter: Secondary | ICD-10-CM

## 2015-09-22 DIAGNOSIS — I10 Essential (primary) hypertension: Secondary | ICD-10-CM | POA: Diagnosis present

## 2015-09-22 DIAGNOSIS — T148 Other injury of unspecified body region: Secondary | ICD-10-CM | POA: Diagnosis not present

## 2015-09-22 DIAGNOSIS — N189 Chronic kidney disease, unspecified: Secondary | ICD-10-CM | POA: Diagnosis not present

## 2015-09-22 DIAGNOSIS — Z96642 Presence of left artificial hip joint: Secondary | ICD-10-CM | POA: Diagnosis not present

## 2015-09-22 DIAGNOSIS — S73002A Unspecified subluxation of left hip, initial encounter: Secondary | ICD-10-CM | POA: Diagnosis not present

## 2015-09-22 DIAGNOSIS — D638 Anemia in other chronic diseases classified elsewhere: Secondary | ICD-10-CM | POA: Diagnosis present

## 2015-09-22 DIAGNOSIS — I471 Supraventricular tachycardia: Secondary | ICD-10-CM | POA: Insufficient documentation

## 2015-09-22 DIAGNOSIS — M25559 Pain in unspecified hip: Secondary | ICD-10-CM | POA: Diagnosis not present

## 2015-09-22 HISTORY — PX: HIP CLOSED REDUCTION: SHX983

## 2015-09-22 SURGERY — CLOSED REDUCTION, HIP
Anesthesia: General | Site: Hip | Laterality: Left

## 2015-09-22 MED ORDER — PROMETHAZINE HCL 25 MG/ML IJ SOLN: 6.2500 mg | INTRAMUSCULAR | Status: DC | PRN

## 2015-09-22 MED ORDER — PROPOFOL 10 MG/ML IV BOLUS
INTRAVENOUS | Status: DC | PRN
  Administered 2015-09-22: 120 mg via INTRAVENOUS

## 2015-09-22 MED ORDER — ASPIRIN EC 81 MG PO TBEC
81.0000 mg | DELAYED_RELEASE_TABLET | Freq: Every day | ORAL | Status: DC
  Administered 2015-09-22 – 2015-09-23 (×2): 81 mg via ORAL
  Filled 2015-09-22 (×2): qty 1

## 2015-09-22 MED ORDER — PROPOFOL 10 MG/ML IV BOLUS
35.0000 mg | Freq: Once | INTRAVENOUS | Status: AC
  Administered 2015-09-22: 35 mg via INTRAVENOUS
  Filled 2015-09-22: qty 20

## 2015-09-22 MED ORDER — PROPOFOL 10 MG/ML IV BOLUS
INTRAVENOUS | Status: AC | PRN
  Administered 2015-09-22: 40 mg via INTRAVENOUS
  Administered 2015-09-22 (×4): 20 mg via INTRAVENOUS

## 2015-09-22 MED ORDER — METOCLOPRAMIDE HCL 5 MG PO TABS: 5.0000 mg | ORAL_TABLET | Freq: Three times a day (TID) | ORAL | Status: DC | PRN

## 2015-09-22 MED ORDER — ONDANSETRON HCL 4 MG/2ML IJ SOLN: 4.0000 mg | Freq: Four times a day (QID) | INTRAMUSCULAR | Status: DC | PRN

## 2015-09-22 MED ORDER — CARVEDILOL 6.25 MG PO TABS
6.2500 mg | ORAL_TABLET | Freq: Every day | ORAL | Status: DC
  Administered 2015-09-23: 6.25 mg via ORAL
  Filled 2015-09-22: qty 1

## 2015-09-22 MED ORDER — DILTIAZEM HCL ER COATED BEADS 240 MG PO CP24
240.0000 mg | ORAL_CAPSULE | Freq: Every evening | ORAL | Status: DC
  Administered 2015-09-22: 240 mg via ORAL
  Filled 2015-09-22: qty 1

## 2015-09-22 MED ORDER — IRBESARTAN 150 MG PO TABS
150.0000 mg | ORAL_TABLET | Freq: Every day | ORAL | Status: DC
Start: 2015-09-23 — End: 2015-09-23
  Administered 2015-09-23: 150 mg via ORAL
  Filled 2015-09-22: qty 1

## 2015-09-22 MED ORDER — OXYCODONE HCL 5 MG PO TABS: 5.0000 mg | ORAL_TABLET | Freq: Once | ORAL | Status: DC | PRN

## 2015-09-22 MED ORDER — OXYCODONE HCL 5 MG/5ML PO SOLN: 5.0000 mg | Freq: Once | ORAL | Status: DC | PRN

## 2015-09-22 MED ORDER — LACTATED RINGERS IV SOLN
INTRAVENOUS | Status: DC | PRN
  Administered 2015-09-22: 18:00:00 via INTRAVENOUS

## 2015-09-22 MED ORDER — HYDROMORPHONE HCL 1 MG/ML IJ SOLN
0.5000 mg | INTRAMUSCULAR | Status: DC | PRN
  Administered 2015-09-22 (×3): 0.5 mg via INTRAVENOUS
  Filled 2015-09-22 (×3): qty 1

## 2015-09-22 MED ORDER — HYDROCODONE-ACETAMINOPHEN 10-325 MG PO TABS: 1.0000 | ORAL_TABLET | ORAL | Status: DC | PRN

## 2015-09-22 MED ORDER — LIDOCAINE HCL (CARDIAC) 20 MG/ML IV SOLN
INTRAVENOUS | Status: AC
  Filled 2015-09-22: qty 5

## 2015-09-22 MED ORDER — FENTANYL CITRATE (PF) 100 MCG/2ML IJ SOLN
INTRAMUSCULAR | Status: AC
  Filled 2015-09-22: qty 2

## 2015-09-22 MED ORDER — ONDANSETRON HCL 4 MG PO TABS: 4.0000 mg | ORAL_TABLET | Freq: Four times a day (QID) | ORAL | Status: DC | PRN

## 2015-09-22 MED ORDER — MELOXICAM 7.5 MG PO TABS
7.5000 mg | ORAL_TABLET | Freq: Every day | ORAL | Status: DC | PRN
  Filled 2015-09-22: qty 1

## 2015-09-22 MED ORDER — METOCLOPRAMIDE HCL 5 MG/ML IJ SOLN: 5.0000 mg | Freq: Three times a day (TID) | INTRAMUSCULAR | Status: DC | PRN

## 2015-09-22 MED ORDER — PROSIGHT PO TABS
1.0000 | ORAL_TABLET | Freq: Two times a day (BID) | ORAL | Status: DC
  Administered 2015-09-22 – 2015-09-23 (×2): 1 via ORAL
  Filled 2015-09-22 (×2): qty 1

## 2015-09-22 MED ORDER — CARVEDILOL 12.5 MG PO TABS
12.5000 mg | ORAL_TABLET | Freq: Every day | ORAL | Status: DC
  Administered 2015-09-22: 12.5 mg via ORAL
  Filled 2015-09-22: qty 1

## 2015-09-22 MED ORDER — LIDOCAINE 2% (20 MG/ML) 5 ML SYRINGE
INTRAMUSCULAR | Status: DC | PRN
  Administered 2015-09-22: 60 mg via INTRAVENOUS

## 2015-09-22 MED ORDER — ACETAMINOPHEN 10 MG/ML IV SOLN
INTRAVENOUS | Status: DC | PRN
  Administered 2015-09-22: 1000 mg via INTRAVENOUS

## 2015-09-22 MED ORDER — ACETAMINOPHEN 650 MG RE SUPP: 650.0000 mg | Freq: Four times a day (QID) | RECTAL | Status: DC | PRN

## 2015-09-22 MED ORDER — ONDANSETRON HCL 4 MG/2ML IJ SOLN
INTRAMUSCULAR | Status: AC
  Filled 2015-09-22: qty 2

## 2015-09-22 MED ORDER — FENTANYL CITRATE (PF) 100 MCG/2ML IJ SOLN: 25.0000 ug | INTRAMUSCULAR | Status: DC | PRN

## 2015-09-22 MED ORDER — ACETAMINOPHEN 10 MG/ML IV SOLN
INTRAVENOUS | Status: AC
  Filled 2015-09-22: qty 100

## 2015-09-22 MED ORDER — CARVEDILOL 6.25 MG PO TABS: 6.2500 mg | ORAL_TABLET | Freq: Two times a day (BID) | ORAL | Status: DC

## 2015-09-22 MED ORDER — ACETAMINOPHEN 325 MG PO TABS: 650.0000 mg | ORAL_TABLET | Freq: Four times a day (QID) | ORAL | Status: DC | PRN

## 2015-09-22 MED ORDER — PROPOFOL 10 MG/ML IV BOLUS
INTRAVENOUS | Status: AC
  Filled 2015-09-22: qty 20

## 2015-09-22 MED ORDER — PROSIGHT PO TABS
1.0000 | ORAL_TABLET | Freq: Every day | ORAL | Status: DC
  Filled 2015-09-22: qty 1

## 2015-09-22 SURGICAL SUPPLY — 1 items: IMMOBILIZER KNEE 22 (SOFTGOODS) ×1 IMPLANT

## 2015-09-22 NOTE — ED Notes (Signed)
Patient transported to X-ray 

## 2015-09-22 NOTE — ED Notes (Signed)
Bed: [id number] Expected date:  Expected time:  Means of arrival:  Comments: EMS- hip injury

## 2015-09-22 NOTE — Brief Op Note (Signed)
09/22/2015  6:36 PM  PATIENT:  Merilyn Baba  82 y.o. female  PRE-OPERATIVE DIAGNOSIS:  dislocated left total hip  POST-OPERATIVE DIAGNOSIS:  dislocated left total hip  PROCEDURE:  Procedure(s): CLOSED REDUCTION HIP (Left)  SURGEON:  Surgeon(s) and Role:    * Melina Schools, MD - Primary  PHYSICIAN ASSISTANT:   ASSISTANTS: none   ANESTHESIA:   general  EBL:     BLOOD ADMINISTERED:none  DRAINS: none   LOCAL MEDICATIONS USED:  NONE  SPECIMEN:  No Specimen  DISPOSITION OF SPECIMEN:  N/A  COUNTS:  YES  TOURNIQUET:  * No tourniquets in log *  DICTATION: .Other Dictation: Dictation Number [id number]  PLAN OF CARE: Admit for overnight observation  PATIENT DISPOSITION:  PACU - hemodynamically stable.

## 2015-09-22 NOTE — Transfer of Care (Signed)
Immediate Anesthesia Transfer of Care Note  Patient: [name]  Procedure(s) Performed: Procedure(s): CLOSED REDUCTION HIP (Left)  Patient Location: PACU  Anesthesia Type:General  Level of Consciousness: awake, alert  and oriented  Airway & Oxygen Therapy: Patient Spontanous Breathing and Patient connected to face mask oxygen  Post-op Assessment: Report given to RN and Post -op Vital signs reviewed and stable  Post vital signs: Reviewed and stable  Last Vitals:  Filed Vitals:   09/22/15 1725 09/22/15 1758  BP: 167/94 172/135  Pulse: 67 65  Temp: 36.4 C 36.6 C  Resp: 11 16    Last Pain:  Filed Vitals:   09/22/15 1803  PainSc: 8          Complications: No apparent anesthesia complications

## 2015-09-22 NOTE — ED Notes (Signed)
Per EMS, patient is from home.  Patient states she was sitting on a stool and went to bend down and heard her left hip pop out of place.   EMS administered 150 mcg of Fentanyl  And 500 ml of NS.  BP:186/90 HR:71 R:18 96% on room air

## 2015-09-22 NOTE — Anesthesia Postprocedure Evaluation (Signed)
Anesthesia Post Note  Patient: [name]  Procedure(s) Performed: Procedure(s) (LRB): CLOSED REDUCTION HIP (Left)  Patient location during evaluation: PACU Anesthesia Type: General Level of consciousness: awake and alert Pain management: pain level controlled Vital Signs Assessment: post-procedure vital signs reviewed and stable Respiratory status: spontaneous breathing, nonlabored ventilation, respiratory function stable and patient connected to nasal cannula oxygen Cardiovascular status: blood pressure returned to baseline and stable Postop Assessment: no signs of nausea or vomiting Anesthetic complications: no    Last Vitals:  Filed Vitals:   09/22/15 1844 09/22/15 1845  BP: 155/68 154/74  Pulse: 68 66  Temp: 36.7 C   Resp: 15 16    Last Pain:  Filed Vitals:   09/22/15 1847  PainSc: 8     LLE Motor Response: Responds to commands (09/22/15 1845) LLE Sensation: No numbness;No tingling (09/22/15 1845) RLE Motor Response: Responds to commands (09/22/15 1845) RLE Sensation: No numbness;No tingling (09/22/15 1845)      Nomi Rudnicki S

## 2015-09-22 NOTE — H&P (Signed)
Kandice Hams, MD Chief Complaint: Left hip dislocation  History: 82 yr old who presents with acute left hip pain, inability to ambulate.  Xrays in ER demonstrate left hip dislocation.  AS a result ortho consult requested.  Past Medical History  Diagnosis Date  . Recurrent Pericarditis     RECURRENT   . Essential hypertension   . Thoracic aneurysm     a. Followed by [facility] -last seen in 12/2013 w/ plan for f/u MRA and thoracic surgery f/u in 2 yrs, but patient does not wish to follow any longer.  . Anemia     CHRONIC  . PSVT (paroxysmal supraventricular tachycardia) ([place])   . CKD (chronic kidney disease), stage II   . GERD (gastroesophageal reflux disease)   . Cancer ([place])     SKIN CANCERS REMOVED FROM LEGS  . Arthritis     OA-SOME BACK AND NECK PAIN,  GOES TO CHIROPRACTOR TWICE A MONTH;  HX OF JOINT REPLACEMENTS   . Aortic insufficiency     a. 2D echo 08/29/15: EF 55-60%, mild LVH, diastolic dysfunction, elevated LV filling pressure, mild AI, severe LAE, mild RAE, mild TR, dilated descending thoracic aorta just distal to the takeoff of the left subclavian, measuring 4.1 cm, no pericardial effusion.    Allergies  Allergen Reactions  . Cephalexin Other (See Comments)    Stroke symptoms  . Keflex [Cephalexin] Other (See Comments)    Pt states eye problems with this medicine  . Shellfish Allergy Hives  . Clindamycin/Lincomycin Rash  . Sulfites   . Ciprofloxacin Rash    RASH  . Sulfa Antibiotics Other (See Comments)    Unknown     No current facility-administered medications on file prior to encounter.   Current Outpatient Prescriptions on File Prior to Encounter  Medication Sig Dispense Refill  . aspirin 81 MG tablet Take 81 mg by mouth daily.    . Calcium Carbonate-Vit D-Min (CALCIUM 1200 PO) Take 1 tablet by mouth daily.     . carboxymethylcellulose (REFRESH TEARS) 0.5 % SOLN Place 1 drop into both eyes daily.     . carvedilol (COREG) 12.5 MG tablet Take 6.25-12.5 mg by  mouth 2 (two) times daily. Takes 1/2 tablet in am and 1 tablet at night    . cholecalciferol (VITAMIN D) 1000 UNITS tablet Take 1,000 Units by mouth daily.    . clobetasol cream (TEMOVATE) [id number] % Apply 1 application topically 2 (two) times daily as needed (itching).     Marland Kitchen diltiazem (CARDIZEM CD) 240 MG 24 hr capsule Take 240 mg by mouth every evening.     . estrogens, conjugated, (PREMARIN) 0.3 MG tablet Take 1 tablet (0.3 mg total) by mouth daily. 90 tablet 4  . meloxicam (MOBIC) 7.5 MG tablet Take 7.5 mg by mouth daily as needed for pain.     . mupirocin ointment (BACTROBAN) 2 % Apply 1 application topically 2 (two) times daily. (Patient taking differently: Apply 1 application topically 2 (two) times daily as needed (for cuts). ) 30 g 0  . omega-3 acid ethyl esters (LOVAZA) 1 g capsule Take 2 g by mouth daily.    . valsartan (DIOVAN) 160 MG tablet Take 160 mg by mouth daily as needed (Systolic BP > [id number]).      Physical Exam: Filed Vitals:   09/22/15 1500 09/22/15 1515  BP: 162/95 163/76  Pulse:  59  Temp:    Resp: 11 11  A+O X3 NVI compartments soft/NT EHL/TA/GA intact No knee/ankle pain with  PROM Left hip painful, obvious deformity of the L LE No SOB/CP Abd soft/NT  Image: Dg Chest 2 View  08/29/2015  CLINICAL DATA:  82 year old female with pleuritic chest pain for 2 days. Personal history of pericarditis. Initial encounter. EXAM: CHEST  2 VIEW COMPARISON:  11/20/2014, 11/21/2012, and earlier. FINDINGS: Stable mediastinal contours since 2014, with a mildly indistinct appearance of the superior mediastinum. Chronic rightward deviation of the trachea at the thoracic inlet appears to be related to tortuosity of the great vessels (chest CTA 01/14/2008). Stable lung volumes. No pneumothorax or pleural effusion. No consolidation. Chronic increased interstitial markings are stable. No acute osseous abnormality identified. IMPRESSION: Stable.  No acute cardiopulmonary abnormality.  Electronically Signed   By: Genevie Ann M.D.   On: 08/29/2015 08:12   Dg Hip Unilat With Pelvis 2-3 Views Left  09/22/2015  CLINICAL DATA:  Injury, left hip pain EXAM: DG HIP (WITH OR WITHOUT PELVIS) 2-3V LEFT COMPARISON:  01/26/2014 FINDINGS: Three views of the left hip submitted. There is superior dislocation of femoral component of left hip prosthesis. No acute fracture. IMPRESSION: Superior subluxation of femoral component of left hip prosthesis. Electronically Signed   By: Lahoma Crocker M.D.   On: 09/22/2015 11:23    A/P: 82 yr old s/p left hip dislocation. No evidence of fracture or DVT. Neurologically intact. Attempt at CR in ER with sedation - unsuccessful.   Plan on going to OR this afternoon for Closed reduction under general anesthesia Informed her that if I am unable to successfully reduce the hip that I will leave it dislocated and defer definitive open reduction to Dr Maureen Ralphs. Risks explained: death, stroke, paralysis, loss of reduction, need for further surgery, fracture, hardware loosening

## 2015-09-22 NOTE — ED Provider Notes (Signed)
CSN: [id number]     Arrival date & time 09/22/15  1023 History   First MD Initiated Contact with Patient 09/22/15 1051     Chief Complaint  Patient presents with  . Hip Injury     (Consider location/radiation/quality/duration/timing/severity/associated sxs/prior Treatment) HPI Comments: 82 year old female presents for left hip pain. The patient reports she was sitting in a chair and she turned to get something off the floor and felt a popping in her hip. She has dislocated this had multiple times in the past. Last time it was dislocated was about a year and a half ago. She has had issues with reductions in the past and needing to go to the operating room but last time she was able to be successfully reduced in the emergency department. Patient denies falling.   Past Medical History  Diagnosis Date  . Recurrent Pericarditis     RECURRENT   . Essential hypertension   . Thoracic aneurysm     a. Followed by [facility] -last seen in 12/2013 w/ plan for f/u MRA and thoracic surgery f/u in 2 yrs, but patient does not wish to follow any longer.  . Anemia     CHRONIC  . PSVT (paroxysmal supraventricular tachycardia) ([place])   . CKD (chronic kidney disease), stage II   . GERD (gastroesophageal reflux disease)   . Cancer ([place])     SKIN CANCERS REMOVED FROM LEGS  . Arthritis     OA-SOME BACK AND NECK PAIN,  GOES TO CHIROPRACTOR TWICE A MONTH;  HX OF JOINT REPLACEMENTS   . Aortic insufficiency     a. 2D echo 08/29/15: EF 55-60%, mild LVH, diastolic dysfunction, elevated LV filling pressure, mild AI, severe LAE, mild RAE, mild TR, dilated descending thoracic aorta just distal to the takeoff of the left subclavian, measuring 4.1 cm, no pericardial effusion.   Past Surgical History  Procedure Laterality Date  . Appendectomy    . Total knee arthroplasty      right  . Revision total hip arthroplasty      left  . Vaginal hysterectomy      with ovarian cyst removal  . Pelvic laparoscopy      ovarian  cyst removal,   . Oophorectomy      has a partial of one ovary remaingin  . Right achilles tendon repair  5/13  . Total knee arthroplasty      left  . Joint replacement      LEFT TOTAL HIP REPLACEMENT AND REVISIONS X 2  . Excision/release bursa hip Left 11/24/2012    Procedure: LEFT HIP BURSECTOMY AND TENDON REPAIR ;  Surgeon: Gearlean Alf, MD;  Location: [facility];  Service: Orthopedics;  Laterality: Left;  . Hip closed reduction Left 12/19/2012    Procedure: CLOSED MANIPULATION HIP;  Surgeon: Mauri Pole, MD;  Location: [facility];  Service: Orthopedics;  Laterality: Left;  . Hip closed reduction Left 12/05/2013    Procedure: CLOSED MANIPULATION HIP;  Surgeon: Augustin Schooling, MD;  Location: [facility];  Service: Orthopedics;  Laterality: Left;   Family History  Problem Relation Age of Onset  . Hypertension Mother   . Stroke Mother   . Heart attack Father 12   Social History  Substance Use Topics  . Smoking status: Former Smoker    Types: Cigarettes  . Smokeless tobacco: Never Used     Comment: quit age 24  . Alcohol Use: No   OB History    Gravida Para Term Preterm  AB TAB SAB Ectopic Multiple Living   2 2        2      Review of Systems  Constitutional: Negative for chills, diaphoresis and fatigue.  HENT: Negative for congestion, postnasal drip and rhinorrhea.   Eyes: Negative for visual disturbance.  Respiratory: Negative for cough, chest tightness and shortness of breath.   Cardiovascular: Negative for chest pain and palpitations.  Gastrointestinal: Negative for nausea, vomiting and abdominal pain.  Genitourinary: Negative for dysuria, urgency and hematuria.  Musculoskeletal: Positive for arthralgias (left hip). Negative for back pain.  Skin: Negative for rash.  Neurological: Negative for dizziness, weakness and numbness.  Hematological: Does not bruise/bleed easily.      Allergies  Cephalexin; Keflex; Shellfish allergy; Clindamycin/lincomycin; Sulfites; Ciprofloxacin;  and Sulfa antibiotics  Home Medications   Prior to Admission medications   Medication Sig Start Date End Date Taking? Authorizing Provider  aspirin 81 MG tablet Take 81 mg by mouth daily.    Historical Provider, MD  Calcium Carbonate-Vit D-Min (CALCIUM 1200 PO) Take 1 tablet by mouth daily.     Historical Provider, MD  carboxymethylcellulose (REFRESH TEARS) 0.5 % SOLN Place 1 drop into both eyes daily.     Historical Provider, MD  carvedilol (COREG) 12.5 MG tablet Take 6.25-12.5 mg by mouth 2 (two) times daily. Takes 1/2 tablet in am and 1 tablet at night    Historical Provider, MD  cholecalciferol (VITAMIN D) 1000 UNITS tablet Take 1,000 Units by mouth daily.    Historical Provider, MD  clobetasol cream (TEMOVATE) AB-123456789 % Apply 1 application topically 2 (two) times daily as needed (itching).  08/08/13   Historical Provider, MD  diltiazem (CARDIZEM CD) 240 MG 24 hr capsule Take 240 mg by mouth daily.  03/05/15   Historical Provider, MD  estrogens, conjugated, (PREMARIN) 0.3 MG tablet Take 1 tablet (0.3 mg total) by mouth daily. 08/21/15   Anastasio Auerbach, MD  meloxicam (MOBIC) 7.5 MG tablet Take 7.5 mg by mouth daily as needed. (PAIN) 03/21/15   Historical Provider, MD  mupirocin ointment (BACTROBAN) 2 % Apply 1 application topically 2 (two) times daily. 12/08/13   Liam Graham, PA-C  omega-3 acid ethyl esters (LOVAZA) 1 g capsule Take 2 g by mouth daily.    Historical Provider, MD  valsartan (DIOVAN) 160 MG tablet Take 160 mg by mouth daily as needed (Systolic BP > XX123456).    Historical Provider, MD   BP 177/72 mmHg  Pulse 66  Temp(Src) 97.6 F (36.4 C) (Oral)  Resp 15  SpO2 94% Physical Exam  Constitutional: She is oriented to person, place, and time. She appears well-developed and well-nourished. No distress.  HENT:  Head: Normocephalic and atraumatic.  Right Ear: External ear normal.  Left Ear: External ear normal.  Nose: Nose normal.  Mouth/Throat: Oropharynx is clear and moist. No  oropharyngeal exudate.  Eyes: EOM are normal. Pupils are equal, round, and reactive to light.  Neck: Normal range of motion. Neck supple.  Cardiovascular: Normal rate, regular rhythm, normal heart sounds and intact distal pulses.   No murmur heard. Pulmonary/Chest: Effort normal. No respiratory distress. She has no wheezes. She has no rales.  Abdominal: Soft. She exhibits no distension. There is no tenderness.  Musculoskeletal: She exhibits no edema.       Left hip: She exhibits decreased range of motion, tenderness and deformity. She exhibits normal strength and no laceration.       Left knee: Normal.  Left leg shortened and  internally rotated  Neurological: She is alert and oriented to person, place, and time.  Skin: Skin is warm and dry. No rash noted. She is not diaphoretic.  Vitals reviewed.   ED Course  .Sedation Date/Time: 09/22/2015 5:03 PM Performed by: Lonia Skinner ROE Authorized by: Harvel Quale  Consent:    Consent obtained:  Verbal and written   Consent given by:  Patient   Risks discussed:  Allergic reaction, prolonged hypoxia resulting in organ damage, prolonged sedation necessitating reversal, respiratory compromise necessitating ventilatory assistance and intubation, vomiting and nausea Indications:    Sedation purpose:  Dislocation reduction   Procedure necessitating sedation performed by:  Physician performing sedation (accompanied by PA helping with reduction)   Intended level of sedation:  Moderate (conscious sedation) Pre-sedation assessment:    Time since last food or drink:  Previous evening >12 hours   Neck mobility: normal     Mouth opening:  2 finger widths   Pre-sedation assessments completed and reviewed: airway patency, cardiovascular function, hydration status, mental status, nausea/vomiting, pain level, respiratory function and temperature   Immediate pre-procedure details:    Reassessment: Patient reassessed immediately prior to procedure      Reviewed: vital signs     Verified: bag valve mask available, emergency equipment available, intubation equipment available, IV patency confirmed, oxygen available and suction available   Procedure details (see MAR for exact dosages):    Preoxygenation:  Nasal cannula   Sedation:  Propofol   Analgesia:  Hydromorphone   Intra-procedure monitoring:  Blood pressure monitoring, cardiac monitor, continuous capnometry, continuous pulse oximetry, frequent LOC assessments and frequent vital sign checks   Intra-procedure events: none     Total sedation time (minutes):  15 Post-procedure details:    Attendance: Constant attendance by certified staff until patient recovered     Recovery: Patient returned to pre-procedure baseline     Post-sedation assessments completed and reviewed: airway patency, cardiovascular function, hydration status, mental status, nausea/vomiting, pain level and respiratory function     Patient is stable for discharge or admission: Yes     Patient tolerance:  Tolerated well, no immediate complications Reduction of dislocation Date/Time: 09/22/2015 5:07 PM Performed by: Lonia Skinner ROE Authorized by: Harvel Quale Consent: Verbal consent obtained. Written consent obtained. Risks and benefits: risks, benefits and alternatives were discussed Consent given by: patient Patient understanding: patient states understanding of the procedure being performed Patient consent: the patient's understanding of the procedure matches consent given Procedure consent: procedure consent matches procedure scheduled Relevant documents: relevant documents present and verified Test results: test results available and properly labeled Imaging studies: imaging studies available Patient identity confirmed: verbally with patient Time out: Immediately prior to procedure a "time out" was called to verify the correct patient, procedure, equipment, support staff and site/side marked as  required. Local anesthesia used: no Patient sedated: yes Sedatives: propofol Analgesia: hydromorphone Vitals: Vital signs were monitored during sedation. Patient tolerance: Patient tolerated the procedure well with no immediate complications Comments: Unable to reduce left hip dislocation   (including critical care time) Labs Review Labs Reviewed - No data to display  Imaging Review No results found. I have personally reviewed and evaluated these images and lab results as part of my medical decision-making.   EKG Interpretation None      MDM  Patient was seen and evaluated at bedside. Left leg shortened and internally rotated. Patient neurovascularly intact. Discussed case with Dr.Alusio who recommended that we attempt reduction in the emergency department. Patient  was sedated with propofol as detailed above. Reduction was attempted by myself and Okey Regal, PAC but was unsuccessful.  Again discussed case with dr. Maureen Ralphs who asked that his partner on call be consulted.  Dr. Rolena Infante came bedside and agreed to take patient for definitive management. Final diagnoses:  None    1. Left hip dislocation    Harvel Quale, MD 09/22/15 [phone]

## 2015-09-22 NOTE — Consult Note (Signed)
[facility] Medical Consultation  [name] [id number] DOB: 02-27-1930 DOA: 09/22/2015 PCP: Kandice Hams, MD   Requesting physician: Melina Schools, MD Date of consultation: 09/22/2015 Reason for consultation: Medical management.  Impression/Recommendations Principal Problem:   Hip dislocation, left ([place]) Continue postprocedure management as per Dr. Rolena Infante. Analgesics as needed.  Active Problems:   Aneurysm of thoracic aorta [place]) The patient was seen last on October 2015, with plan to follow-up in 2 years. The patient is not inclined to have further follow-ups.    Aortic insufficiency Mild per last echo done recently. Continue current management and interval echocardiographic follow-ups    Essential hypertension Continue carvedilol and Cardizem. Monitor blood pressure.      Anemia Check CBC in a.m.   We will followup again tomorrow. Please contact me if I can be of assistance in the meanwhile. Thank you for this consultation.   Chief Complaint: Left hip dislocation  HPI: 82 year old female with a past medical history of essential hypertension, aortic insufficiency, thoracic aneurysm, PSVT, CKD, GERD, osteoarthritis who we are seeing on consult after she underwent reduction of left hip dislocation.  Per patient, she states that she was sitting on a stool at home, when she tried to bend down, and her and felt her left hip popping out of place. She was brought to the emergency department via EMS and subsequently underwent closed reduction by Dr. Rolena Infante.  She denies fever, chills, chest pain, dyspnea, palpitations, dizziness, diaphoresis, PND, orthopnea or pitting edema of the lower extremities. She denies productive cough, wheezing, GI or GU symptoms.   Review of Systems:  As mentioned on history of present illness, otherwise negative.  Past Medical History  Diagnosis Date  . Recurrent Pericarditis     RECURRENT   . Essential hypertension   .  Thoracic aneurysm     a. Followed by [facility] -last seen in 12/2013 w/ plan for f/u MRA and thoracic surgery f/u in 2 yrs, but patient does not wish to follow any longer.  . Anemia     CHRONIC  . PSVT (paroxysmal supraventricular tachycardia) ([place])   . CKD (chronic kidney disease), stage II   . GERD (gastroesophageal reflux disease)   . Cancer ([place])     SKIN CANCERS REMOVED FROM LEGS  . Arthritis     OA-SOME BACK AND NECK PAIN,  GOES TO CHIROPRACTOR TWICE A MONTH;  HX OF JOINT REPLACEMENTS   . Aortic insufficiency     a. 2D echo 08/29/15: EF 55-60%, mild LVH, diastolic dysfunction, elevated LV filling pressure, mild AI, severe LAE, mild RAE, mild TR, dilated descending thoracic aorta just distal to the takeoff of the left subclavian, measuring 4.1 cm, no pericardial effusion.   Past Surgical History  Procedure Laterality Date  . Appendectomy    . Total knee arthroplasty      right  . Revision total hip arthroplasty      left  . Vaginal hysterectomy      with ovarian cyst removal  . Pelvic laparoscopy      ovarian cyst removal,   . Oophorectomy      has a partial of one ovary remaingin  . Right achilles tendon repair  5/13  . Total knee arthroplasty      left  . Joint replacement      LEFT TOTAL HIP REPLACEMENT AND REVISIONS X 2  . Excision/release bursa hip Left 11/24/2012    Procedure: LEFT HIP BURSECTOMY AND TENDON REPAIR ;  Surgeon: Gearlean Alf,  MD;  Location: [facility];  Service: Orthopedics;  Laterality: Left;  . Hip closed reduction Left 12/19/2012    Procedure: CLOSED MANIPULATION HIP;  Surgeon: Mauri Pole, MD;  Location: [facility];  Service: Orthopedics;  Laterality: Left;  . Hip closed reduction Left 12/05/2013    Procedure: CLOSED MANIPULATION HIP;  Surgeon: Augustin Schooling, MD;  Location: [facility];  Service: Orthopedics;  Laterality: Left;   Social History:  reports that she has quit smoking. Her smoking use included Cigarettes. She has never used smokeless tobacco. She  reports that she does not drink alcohol or use illicit drugs.  Allergies  Allergen Reactions  . Cephalexin Other (See Comments)    Stroke symptoms  . Keflex [Cephalexin] Other (See Comments)    Pt states eye problems with this medicine  . Shellfish Allergy Hives  . Clindamycin/Lincomycin Rash  . Sulfites   . Ciprofloxacin Rash    RASH  . Sulfa Antibiotics Other (See Comments)    Unknown    Family History  Problem Relation Age of Onset  . Hypertension Mother   . Stroke Mother   . Heart attack Father 71    Prior to Admission medications   Medication Sig Start Date End Date Taking? Authorizing Provider  aspirin 81 MG tablet Take 81 mg by mouth daily.   Yes Historical Provider, MD  Calcium Carbonate-Vit D-Min (CALCIUM 1200 PO) Take 1 tablet by mouth daily.    Yes Historical Provider, MD  carboxymethylcellulose (REFRESH TEARS) 0.5 % SOLN Place 1 drop into both eyes daily.    Yes Historical Provider, MD  carvedilol (COREG) 12.5 MG tablet Take 6.25-12.5 mg by mouth 2 (two) times daily. Takes 1/2 tablet in am and 1 tablet at night   Yes Historical Provider, MD  cholecalciferol (VITAMIN D) 1000 UNITS tablet Take 1,000 Units by mouth daily.   Yes Historical Provider, MD  clobetasol cream (TEMOVATE) AB-123456789 % Apply 1 application topically 2 (two) times daily as needed (itching).  08/08/13  Yes Historical Provider, MD  diltiazem (CARDIZEM CD) 240 MG 24 hr capsule Take 240 mg by mouth every evening.  03/05/15  Yes Historical Provider, MD  estrogens, conjugated, (PREMARIN) 0.3 MG tablet Take 1 tablet (0.3 mg total) by mouth daily. 08/21/15  Yes Anastasio Auerbach, MD  meloxicam (MOBIC) 7.5 MG tablet Take 7.5 mg by mouth daily as needed for pain.  03/21/15  Yes Historical Provider, MD  mupirocin ointment (BACTROBAN) 2 % Apply 1 application topically 2 (two) times daily. Patient taking differently: Apply 1 application topically 2 (two) times daily as needed (for cuts).  12/08/13  Yes Liam Graham, PA-C   omega-3 acid ethyl esters (LOVAZA) 1 g capsule Take 2 g by mouth daily.   Yes Historical Provider, MD  valsartan (DIOVAN) 160 MG tablet Take 160 mg by mouth daily as needed (Systolic BP > XX123456).   Yes Historical Provider, MD   Physical Exam: Blood pressure 169/69, pulse 66, temperature 98.1 F (36.7 C), temperature source Oral, resp. rate 13, height 5\' 5"  (1.651 m), weight 68.04 kg (150 lb), SpO2 98 %. Filed Vitals:   09/22/15 1900 09/22/15 1915  BP: 156/76 169/69  Pulse: 64 66  Temp:    Resp: 14 13     General:  No acute distress.  Eyes: PERRL, normal conjunctiva.  ENT: Oral mucosa is moist. Throat was clear.  Neck: Supple, no JVD.  Cardiovascular: S1 and S2, RRR,  palpable distal pulses.   Respiratory: Clear to  auscultation.  Abdomen: Soft, nontender.  Skin: No lesions on limited exam.  Musculoskeletal:  no edema, clubbing or cyanosis.   Psychiatric:  normal mood.   Neurologic: Awake alert oriented 3, grossly nonfocal.  Labs on Admission:   Radiological Exams on Admission: Dg Hip Unilat With Pelvis 2-3 Views Left  09/22/2015  CLINICAL DATA:  Post closed hip reduction. EXAM: DG HIP (WITH OR WITHOUT PELVIS) 2-3V LEFT COMPARISON:  09/22/2015 FINDINGS: There has been interval reduction of the prosthetic left hip with anatomic alignment of the prosthetic femoral head within the acetabulum. There is no evidence of hardware loosening or fracture. There is curvilinear lucency through the left superior lateral acetabulum, immediately adjacent to the prosthesis, which may represent an avulsion fracture. IMPRESSION: Status post left prosthetic hip relocation. Possible avulsion fracture of the left superior lateral bony acetabulum. Electronically Signed   By: Fidela Salisbury M.D.   On: 09/22/2015 18:55   Dg Hip Unilat With Pelvis 2-3 Views Left  09/22/2015  CLINICAL DATA:  Injury, left hip pain EXAM: DG HIP (WITH OR WITHOUT PELVIS) 2-3V LEFT COMPARISON:  01/26/2014 FINDINGS:  Three views of the left hip submitted. There is superior dislocation of femoral component of left hip prosthesis. No acute fracture. IMPRESSION: Superior subluxation of femoral component of left hip prosthesis. Electronically Signed   By: Lahoma Crocker M.D.   On: 09/22/2015 11:23    Echocardiogram:  LV EF: 55% - 60%  ------------------------------------------------------------------- History: PMH: Recurrent pericarditis. Thoracic aneurysm. Heart murmur. Chronic kidney disease. Aortic insufficiency. Pericarditis. Risk factors: Hypertension.  ------------------------------------------------------------------- Study Conclusions  - Left ventricle: The cavity size was normal. Wall thickness was  increased in a pattern of mild LVH. Systolic function was normal.  The estimated ejection fraction was in the range of 55% to 60%.  Wall motion was normal; there were no regional wall motion  abnormalities. Doppler parameters are consistent with abnormal  left ventricular relaxation (grade 1 diastolic dysfunction). The  E/e&' ratio is >15, suggesting elevated LV filling pressure. - Aortic valve: Trileaflet; mildly calcified leaflets. There was  mild regurgitation. - Mitral valve: Mildly thickened leaflets . There was trivial  regurgitation. - Left atrium: Severely dilated. The atrium was normal in size. - Right ventricle: The cavity size was mildly dilated. Wall  thickness was normal. Systolic function is reduced. - Right atrium: The atrium was mildly dilated. - Tricuspid valve: There was mild regurgitation. - Pulmonary arteries: PA peak pressure: 19 mm Hg (S). - Inferior vena cava: The vessel was normal in size. The  respirophasic diameter changes were in the normal range (>= 50%),  consistent with normal central venous pressure. - Pericardium, extracardiac: The descending thoracic aorta measures  up to 4.1 cm, just distal to the takeoff of the left subclavian.  There was  no pericardial effusion.  Impressions:  - LVEF 55-60%, mild LVH, normal wall motion, diastolic dysfunction,  elevated LV filling pressure, mild central AI (trileaflet, no  stenosis), trivial MR, severe LAE, mild RAE, mild TR, normal  RVSP, dilated descending thoracic aorta just distal to the  takeoff of the left subclavian, measuring 4.1 cm, no pericardial  effusion.  Time spent: 50 minutes  Reubin Milan, MD [facility] Pager [phone].  If 7PM-7AM, please contact night-coverage [email or web address] Password [facility] 09/22/2015, 7:26 PM

## 2015-09-22 NOTE — Anesthesia Preprocedure Evaluation (Signed)
Anesthesia Evaluation  Patient identified by MRN, date of birth, ID band Patient awake    Reviewed: Allergy & Precautions, NPO status , Patient's Chart, lab work & pertinent test results  Airway Mallampati: II  TM Distance: >3 FB Neck ROM: Full    Dental no notable dental hx.    Pulmonary neg pulmonary ROS, former smoker,    Pulmonary exam normal breath sounds clear to auscultation       Cardiovascular hypertension, + Peripheral Vascular Disease  negative cardio ROS Normal cardiovascular exam Rhythm:Regular Rate:Normal + Systolic murmurs    Neuro/Psych negative neurological ROS  negative psych ROS   GI/Hepatic negative GI ROS, Neg liver ROS,   Endo/Other  negative endocrine ROS  Renal/GU Renal InsufficiencyRenal disease  negative genitourinary   Musculoskeletal negative musculoskeletal ROS (+)   Abdominal   Peds negative pediatric ROS (+)  Hematology negative hematology ROS (+)   Anesthesia Other Findings   Reproductive/Obstetrics negative OB ROS                             Anesthesia Physical Anesthesia Plan  ASA: III  Anesthesia Plan: General   Post-op Pain Management:    Induction: Intravenous  Airway Management Planned: Mask  Additional Equipment:   Intra-op Plan:   Post-operative Plan:   Informed Consent: I have reviewed the patients History and Physical, chart, labs and discussed the procedure including the risks, benefits and alternatives for the proposed anesthesia with the patient or authorized representative who has indicated his/her understanding and acceptance.   Dental advisory given  Plan Discussed with: CRNA and Surgeon  Anesthesia Plan Comments:         Anesthesia Quick Evaluation

## 2015-09-22 NOTE — Progress Notes (Signed)
Pt in with imaging indicating Superior subluxation of femoral component of left hip prosthesis Cm assessed pt for DMe needs Pt states she does not need DME because her "90 [age] year old husband has everything at home"  States they have a walker, cane, crutch, bedside commode   Pt states she previously used [place] Pt informs Cm she "don't think I will need them this time If I do I will call them"

## 2015-09-23 ENCOUNTER — Encounter (HOSPITAL_COMMUNITY): Payer: Self-pay | Admitting: Orthopedic Surgery

## 2015-09-23 DIAGNOSIS — T84021A Dislocation of internal left hip prosthesis, initial encounter: Secondary | ICD-10-CM | POA: Diagnosis not present

## 2015-09-23 MED ORDER — HYDROCODONE-ACETAMINOPHEN 10-325 MG PO TABS: 1.0000 | ORAL_TABLET | ORAL | Status: DC | PRN

## 2015-09-23 NOTE — Discharge Summary (Signed)
Physician Discharge Summary   Patient ID: [name] MRN: [id number] DOB/AGE: 04-09-1929 82 y.o.  Admit date: 09/22/2015 Discharge date: 09/23/2015  Primary Diagnosis:   dislocated left total hip  Admission Diagnoses:  Past Medical History  Diagnosis Date  . Recurrent Pericarditis     RECURRENT   . Essential hypertension   . Thoracic aneurysm     a. Followed by [facility] -last seen in 12/2013 w/ plan for f/u MRA and thoracic surgery f/u in 2 yrs, but patient does not wish to follow any longer.  . Anemia     CHRONIC  . PSVT (paroxysmal supraventricular tachycardia) ([place])   . CKD (chronic kidney disease), stage II   . GERD (gastroesophageal reflux disease)   . Cancer ([place])     SKIN CANCERS REMOVED FROM LEGS  . Arthritis     OA-SOME BACK AND NECK PAIN,  GOES TO CHIROPRACTOR TWICE A MONTH;  HX OF JOINT REPLACEMENTS   . Aortic insufficiency     a. 2D echo 08/29/15: EF 55-60%, mild LVH, diastolic dysfunction, elevated LV filling pressure, mild AI, severe LAE, mild RAE, mild TR, dilated descending thoracic aorta just distal to the takeoff of the left subclavian, measuring 4.1 cm, no pericardial effusion.   Discharge Diagnoses:   Principal Problem:   Hip dislocation, left (HCC) Active Problems:   Aneurysm of thoracic aorta (HCC)   Essential hypertension   Aortic insufficiency   Anemia  Procedure:  Procedure(s) (LRB): CLOSED REDUCTION HIP (Left)   Consults: None  HPI: An 82 year old woman who had sustained a left hip periprosthetic dislocation. Attempted a closed reduction in the ER, which was unsuccessful. Brought to the OR for general anesthetic and closed reduction.   Laboratory Data: Hospital Outpatient Visit on 08/29/2015  Component Date Value Ref Range Status  . LV PW d 08/29/2015 13.6* 0.6 - 1.1 mm Final  . FS 08/29/2015 29  28 - 44 % Final  . LA vol 08/29/2015 91.3   Final  . LA ID, A-P, ES 08/29/2015 31   Final  . IVS/LV PW RATIO, ED 08/29/2015 .85    Final  . Reg peak vel 08/29/2015 202   Final  . LV e' LATERAL 08/29/2015 6.64   Final  . LV E/e' medial 08/29/2015 16.11   Final  . LV E/e'average 08/29/2015 16.11   Final  . LA diam index 08/29/2015 1.75   Final  . LA vol A4C 08/29/2015 80.6   Final  . P 1/2 time 08/29/2015 615   Final  . E decel time 08/29/2015 215   Final  . LVOT diameter 08/29/2015 22   Final  . LVOT area 08/29/2015 3.8   Final  . Peak grad 08/29/2015 5   Final  . E/e' ratio 08/29/2015 16.11   Final  . MV pk E vel 08/29/2015 107   Final  . TR max vel 08/29/2015 202   Final  . MV pk A vel 08/29/2015 86.8   Final  . LA vol index 08/29/2015 51.6   Final  . MV Dec 08/29/2015 215   Final  . LA diam end sys 08/29/2015 31   Final  . TDI e' medial 08/29/2015 5.98   Final  . TDI e' lateral 08/29/2015 6.64   Final   No results for input(s): HGB in the last 72 hours. No results for input(s): WBC, RBC, HCT, PLT in the last 72 hours. No results for input(s): NA, K, CL, CO2, BUN, CREATININE, GLUCOSE, CALCIUM in the  last 72 hours. No results for input(s): LABPT, INR in the last 72 hours.  X-Rays:Dg Chest 2 View  08/29/2015  CLINICAL DATA:  82 year old female with pleuritic chest pain for 2 days. Personal history of pericarditis. Initial encounter. EXAM: CHEST  2 VIEW COMPARISON:  11/20/2014, 11/21/2012, and earlier. FINDINGS: Stable mediastinal contours since 2014, with a mildly indistinct appearance of the superior mediastinum. Chronic rightward deviation of the trachea at the thoracic inlet appears to be related to tortuosity of the great vessels (chest CTA 01/14/2008). Stable lung volumes. No pneumothorax or pleural effusion. No consolidation. Chronic increased interstitial markings are stable. No acute osseous abnormality identified. IMPRESSION: Stable.  No acute cardiopulmonary abnormality. Electronically Signed   By: Genevie Ann M.D.   On: 08/29/2015 08:12   Dg Hip Unilat With Pelvis 2-3 Views Left  09/22/2015  CLINICAL DATA:   Post closed hip reduction. EXAM: DG HIP (WITH OR WITHOUT PELVIS) 2-3V LEFT COMPARISON:  09/22/2015 FINDINGS: There has been interval reduction of the prosthetic left hip with anatomic alignment of the prosthetic femoral head within the acetabulum. There is no evidence of hardware loosening or fracture. There is curvilinear lucency through the left superior lateral acetabulum, immediately adjacent to the prosthesis, which may represent an avulsion fracture. IMPRESSION: Status post left prosthetic hip relocation. Possible avulsion fracture of the left superior lateral bony acetabulum. Electronically Signed   By: Fidela Salisbury M.D.   On: 09/22/2015 18:55   Dg Hip Unilat With Pelvis 2-3 Views Left  09/22/2015  CLINICAL DATA:  Injury, left hip pain EXAM: DG HIP (WITH OR WITHOUT PELVIS) 2-3V LEFT COMPARISON:  01/26/2014 FINDINGS: Three views of the left hip submitted. There is superior dislocation of femoral component of left hip prosthesis. No acute fracture. IMPRESSION: Superior subluxation of femoral component of left hip prosthesis. Electronically Signed   By: Lahoma Crocker M.D.   On: 09/22/2015 11:23    EKG: Orders placed or performed in visit on 09/04/15  . EKG 12-Lead     Hospital Course: Patient was admitted to [facility] and taken to the OR and underwent the above state procedure without complications.  Patient tolerated the procedure well and was later transferred to the recovery room and then to the orthopaedic floor for postoperative care.  They were given PO and IV analgesics for pain control following their surgery.    PT was consulted postop to assist with mobility and transfers and hip precautions.  The patient was allowed to be WBAT with therapy and was taught hip precautions. Discharge planning was consulted to help with postop disposition and equipment needs.  Patient had a decent night on the evening of surgery and started to get up OOB with therapy on day one. Patient was seen  in rounds and was ready to go home on day one.  They were given discharge instructions and dressing directions.  They were instructed on when to follow up in the office with Dr. Wynelle Link in 2-3 weeks.  Discharge home Diet - Cardiac diet Follow up - in 2-3 weeks Activity - WBAT Disposition - Home Condition Upon Discharge - Good D/C Meds - See DC Summary  Discharge Instructions    Call MD / Call 911    Complete by:  As directed   If you experience chest pain or shortness of breath, CALL 911 and be transported to the hospital emergency room.  If you develope a fever above 101 F, pus (white drainage) or increased drainage or redness at the  wound, or calf pain, call your surgeon's office.     Constipation Prevention    Complete by:  As directed   Drink plenty of fluids.  Prune juice may be helpful.  You may use a stool softener, such as Colace (over the counter) 100 mg twice a day.  Use MiraLax (over the counter) for constipation as needed.     Diet - low sodium heart healthy    Complete by:  As directed      Discharge instructions    Complete by:  As directed   Pick up stool softner and laxative for home use following surgery while on pain medications. Continue to use ice for pain and swelling after surgery. Hip precautions.  Total Hip Protocol.     Do not sit on low chairs, stoools or toilet seats, as it may be difficult to get up from low surfaces    Complete by:  As directed      Driving restrictions    Complete by:  As directed   No driving until released by the physician.     Follow the hip precautions as taught in Physical Therapy    Complete by:  As directed      Increase activity slowly as tolerated    Complete by:  As directed      Lifting restrictions    Complete by:  As directed   No lifting until released by the physician.     Patient may shower    Complete by:  As directed   You may shower without a dressing once there is no drainage.  Do not wash over the wound.  If  drainage remains, do not shower until drainage stops.     Weight bearing as tolerated    Complete by:  As directed   Laterality:  left  Extremity:  Lower            Medication List    TAKE these medications        aspirin 81 MG tablet  Take 81 mg by mouth daily.     CALCIUM 1200 PO  Take 1 tablet by mouth daily.     carvedilol 12.5 MG tablet  Commonly known as:  COREG  Take 6.25-12.5 mg by mouth 2 (two) times daily. Takes 1/2 tablet in am and 1 tablet at night     cholecalciferol 1000 units tablet  Commonly known as:  VITAMIN D  Take 1,000 Units by mouth daily.     clobetasol cream 0.05 %  Commonly known as:  TEMOVATE  Apply 1 application topically 2 (two) times daily as needed (itching).     diltiazem 240 MG 24 hr capsule  Commonly known as:  CARDIZEM CD  Take 240 mg by mouth every evening.     estrogens (conjugated) 0.3 MG tablet  Commonly known as:  PREMARIN  Take 1 tablet (0.3 mg total) by mouth daily.     HYDROcodone-acetaminophen 10-325 MG tablet  Commonly known as:  NORCO  Take 1-2 tablets by mouth every 4 (four) hours as needed (breakthrough pain).     meloxicam 7.5 MG tablet  Commonly known as:  MOBIC  Take 7.5 mg by mouth daily as needed for pain.     mupirocin ointment 2 %  Commonly known as:  BACTROBAN  Apply 1 application topically 2 (two) times daily.     omega-3 acid ethyl esters 1 g capsule  Commonly known as:  LOVAZA  Take 2 g by  mouth daily.     REFRESH TEARS 0.5 % Soln  Generic drug:  carboxymethylcellulose  Place 1 drop into both eyes daily.     valsartan 160 MG tablet  Commonly known as:  DIOVAN  Take 160 mg by mouth daily as needed (Systolic BP > [id number]).           Follow-up Information    Follow up with Gearlean Alf, MD In 3 weeks.   Specialty:  Orthopedic Surgery   Why:  Call office for appointment at [phone] to see Dr. Wynelle Link in 2-3 weeks for follow-up.   Contact information:   [address] [place] [address] [id number]       Signed: Arlee Muslim, PA-C Orthopaedic Surgery 09/23/2015, 8:09 AM

## 2015-09-23 NOTE — Progress Notes (Signed)
   Subjective: 1 Day Post-Op Procedure(s) (LRB): CLOSED REDUCTION HIP (Left) Patient reports pain as mild.   Patient seen in rounds with Dr. Wynelle Link.  Briefly discussed the events surrounding the dislocation. May remove the brace.  Will get up today and then discharge home. Patient is well, and has had no acute complaints or problems Patient is ready to go home following therapy.  Objective: Vital signs in last 24 hours: Temp:  [97.2 F (36.2 C)-98.4 F (36.9 C)] 98.3 F (36.8 C) (07/11 0604) Pulse Rate:  [56-115] 65 (07/11 0604) Resp:  [10-21] 16 (07/11 0604) BP: (134-193)/(62-171) 152/71 mmHg (07/11 0604) SpO2:  [93 %-100 %] 95 % (07/11 0604) Weight:  [68.04 kg (150 lb)] 68.04 kg (150 lb) (07/10 1210)  Intake/Output from previous day:  Intake/Output Summary (Last 24 hours) at 09/23/15 0759 Last data filed at 09/23/15 0600  Gross per 24 hour  Intake    900 ml  Output   1800 ml  Net   -900 ml   Labs: No results for input(s): HGB in the last 72 hours. No results for input(s): WBC, RBC, HCT, PLT in the last 72 hours. No results for input(s): NA, K, CL, CO2, BUN, CREATININE, GLUCOSE, CALCIUM in the last 72 hours. No results for input(s): LABPT, INR in the last 72 hours.  EXAM: General - Patient is Alert, Appropriate and Oriented Extremity - Neurovascular intact Sensation intact distally Dorsiflexion/Plantar flexion intact Motor Function - intact, moving foot and toes well on exam.   Assessment/Plan: 1 Day Post-Op Procedure(s) (LRB): CLOSED REDUCTION HIP (Left) Procedure(s) (LRB): CLOSED REDUCTION HIP (Left) Past Medical History  Diagnosis Date  . Recurrent Pericarditis     RECURRENT   . Essential hypertension   . Thoracic aneurysm     a. Followed by [facility] -last seen in 12/2013 w/ plan for f/u MRA and thoracic surgery f/u in 2 yrs, but patient does not wish to follow any longer.  . Anemia     CHRONIC  . PSVT (paroxysmal supraventricular tachycardia) ([place])   . CKD  (chronic kidney disease), stage II   . GERD (gastroesophageal reflux disease)   . Cancer ([place])     SKIN CANCERS REMOVED FROM LEGS  . Arthritis     OA-SOME BACK AND NECK PAIN,  GOES TO CHIROPRACTOR TWICE A MONTH;  HX OF JOINT REPLACEMENTS   . Aortic insufficiency     a. 2D echo 08/29/15: EF 55-60%, mild LVH, diastolic dysfunction, elevated LV filling pressure, mild AI, severe LAE, mild RAE, mild TR, dilated descending thoracic aorta just distal to the takeoff of the left subclavian, measuring 4.1 cm, no pericardial effusion.   Principal Problem:   Hip dislocation, left (HCC) Active Problems:   Aneurysm of thoracic aorta (HCC)   Essential hypertension   Aortic insufficiency   Anemia  Estimated body mass index is 24.96 kg/(m^2) as calculated from the following:   Height as of this encounter: 5\' 5"  (1.651 m).   Weight as of this encounter: 68.04 kg (150 lb). Up with therapy Discharge home Diet - Cardiac diet Follow up - in 2-3 weeks Activity - WBAT Disposition - Home Condition Upon Discharge - Good D/C Meds - See DC Summary  Arlee Muslim, PA-C Orthopaedic Surgery 09/23/2015, 7:59 AM

## 2015-09-23 NOTE — Care Management Obs Status (Signed)
[facility] NOTIFICATION   Patient Details  Name: JAHNI MEDCALF MRN: [id number] Date of Birth: 16-Jun-1929   Medicare Observation Status Notification Given:  Yes  MOON given and signed by pt; copy to pt; original to CMA to be scanned into medical record.  Dellie Catholic, RN 09/23/2015, 10:47 AM

## 2015-09-23 NOTE — Progress Notes (Signed)
Pt;s valuables retrieved from [place] office and returned to patient:1 goldtoned wedding band and 1 silver toned link watch

## 2015-09-23 NOTE — Op Note (Signed)
[name], [name]             ACCOUNT NO.:  [id number]  MEDICAL RECORD NO.:  [id number]  LOCATION:  [id number]                         FACILITY:  [facility]  PHYSICIAN:  Mory Herrman D. Rolena Infante, M.D. DATE OF BIRTH:  [birth year]  DATE OF PROCEDURE:  09/22/2015 DATE OF DISCHARGE:                              OPERATIVE REPORT   PREOPERATIVE DIAGNOSIS:  Left total hip prosthesis dislocation.  POSTOPERATIVE DIAGNOSE:  Left total hip prosthesis dislocation.  OPERATIVE PROCEDURE:  Closed reduction, left hip.  COMPLICATIONS:  None.  CONDITION:  Stable.  HISTORY:  An 82 year old woman who had __________suffered a left hip periprosthetic dislocation.  Attempted a closed reduction in the ER, which was unsuccessful.  Brought to the OR for general anesthetic and closed reduction.  OPERATIVE NOTE:  The patient was brought to the operating room and placed supine in the operating room on a stretcher.  After successful induction of mask anesthesia, the patient was completely relaxed.  After a time-out was performed, a gentle closed reduction maneuver was performed and with an audible clunk, the hip was reduced.  A knee immobilizer was applied.  X-rays, AP and lateral were taken, which demonstrated satisfactory reduction.  The patient was then awoken and taken back to the PACU without incident. At the end of the case, all needle and sponge counts were correct. There were no adverse intraoperative events.     Dominyck Reser D. Rolena Infante, M.D.     DDB/MEDQ  D:  09/22/2015  T:  09/23/2015  Job:  [id number]

## 2015-09-23 NOTE — Evaluation (Addendum)
Physical Therapy Evaluation Patient Details Name: [name] MRN: [id number] DOB: Nov 22, 1929 Today's Date: 09/23/2015   History of Present Illness  L hip dislocation, s/p closed reduction; h/o multiple prior L hip dislocations  Clinical Impression  Pt is modified independent with mobility. When asked if she knew posterior hip precautions, she verbalized 2 of 3 but was unfamiliar with avoiding hip flexion greater than 90*. Reviewed precautions in detail. Pt stated she does yoga and knee to chest stretches in supine, advised pt to discontinue that stretch and to adhere to precautions with yoga. HHPT recommended to reinforce hip precautions in the home.     Follow Up Recommendations Home health PT (to reinforce posterior hip precautions 2* multiple dislocations, pt wasn't familiar with avoiding hip flexion greater than 90*)    Equipment Recommendations  None recommended by PT    Recommendations for Other Services       Precautions / Restrictions Precautions Precautions: Posterior Hip Precaution Booklet Issued: Yes (comment) Precaution Comments: Reviewed precautions in detail. Pt verbalized knowledge of not ADDUcting or internally rotating left hip but was not familiar with avoiding more than 90* hip flexion. She stated she does yoga and knee to chest stretches Required Braces or Orthoses: Knee Immobilizer - Left Knee Immobilizer - Left:  (not specified in orders, RN to page ortho PA to clarify) Restrictions Weight Bearing Restrictions: No Other Position/Activity Restrictions: wbat      Mobility  Bed Mobility Overal bed mobility: Independent                Transfers Overall transfer level: Modified independent Equipment used: Rolling walker (2 wheeled)                Ambulation/Gait Ambulation/Gait assistance: Modified independent (Device/Increase time) Ambulation Distance (Feet): 160 Feet Assistive device: Rolling walker (2 wheeled) Gait  Pattern/deviations: Step-to pattern   Gait velocity interpretation: at or above normal speed for age/gender General Gait Details: steady with RW, no LOB, adhered to hip precautions  Stairs            Wheelchair Mobility    Modified Rankin (Stroke Patients Only)       Balance Overall balance assessment: Modified Independent                                           Pertinent Vitals/Pain Pain Assessment: 0-10 Pain Score: 5  Pain Location: L hip Pain Descriptors / Indicators: Sore Pain Intervention(s): Monitored during session;Limited activity within patient's tolerance;Ice applied    Home Living Family/patient expects to be discharged to:: Private residence Living Arrangements: Spouse/significant other   Type of Home: House Home Access: Stairs to enter Entrance Stairs-Rails: Chemical engineer of Steps: 3 Home Layout: Two level (stair lift to second floor) Home Equipment: Walker - 2 wheels      Prior Function Level of Independence: Independent               Hand Dominance        Extremity/Trunk Assessment   Upper Extremity Assessment: Overall WFL for tasks assessed           Lower Extremity Assessment: Overall WFL for tasks assessed      Cervical / Trunk Assessment: Normal  Communication   Communication: No difficulties  Cognition Arousal/Alertness: Awake/alert Behavior During Therapy: WFL for tasks assessed/performed Overall Cognitive Status: Within Functional Limits for tasks assessed  General Comments      Exercises        Assessment/Plan    PT Assessment All further PT needs can be met in the next venue of care  PT Diagnosis Acute pain   PT Problem List Decreased knowledge of precautions;Pain  PT Treatment Interventions     PT Goals (Current goals can be found in the Care Plan section) Acute Rehab PT Goals Patient Stated Goal: return home where she cares for her  80 y.o. husband PT Goal Formulation: All assessment and education complete, DC therapy    Frequency     Barriers to discharge        Co-evaluation               End of Session Equipment Utilized During Treatment: Gait belt Activity Tolerance: Patient tolerated treatment well Patient left: in chair;with call bell/phone within reach Nurse Communication: Mobility status (RN to clarify order for KI)         Time: 1050-1114 PT Time Calculation (min) (ACUTE ONLY): 24 min   Charges:   PT Evaluation $PT Eval Low Complexity: 1 Procedure PT Treatments $Gait Training: 8-22 mins   PT G Codes:  PT G-Codes **NOT FOR INPATIENT CLASS** Functional Assessment Tool Used clinical judgement clinical judgement at 1141 on 09/23/15 by Lucile Crater, PT Functional Limitation Mobility: Walking and moving around Mobility: Walking and moving around at 1141 on 09/23/15 by Lucile Crater, PT Mobility: Walking and Moving Around Current Status [phone]) At least 1 percent but less than 20 percent impaired, limited or restricted CI at 1141 on 09/23/15 by Lucile Crater, PT Mobility: Walking and Moving Around Goal Status [phone]) At least 1 percent but less than 20 percent impaired, limited or restricted CI at 1141 on 09/23/15 by Lucile Crater, PT Mobility: Walking and Moving Around Discharge Status [phone]) At least 1 percent but less than 20 percent impaired, limited or restricted        Philomena Doheny 09/23/2015, 11:41 AM [phone]

## 2015-09-23 NOTE — Discharge Instructions (Signed)
Prosthetic Hip Dislocation  A dislocated hip prosthesis means that the artificial hip joint (prosthesis) has moved out of place. Once the prosthesis has been put back in place, you may resume your normal activities unless your caregiver tells you otherwise. You may use a cane, crutches, or a walker to relieve your discomfort as recommended by your caregiver. Pain medicine may be prescribed for the next few days. Do not lie on your affected hip until your caregiver approves.   If you were given medicine to help you relax (sedative), you should not drive, operate machinery, or make important decisions for at least 24 hours. You will also need a responsible person to drive you home.  The following are tips to reduce the risk of having another dislocation:   Do not cross your legs. Try to keep your knees apart when getting in and out of your car.   Do not lean forward beyond 90 degrees or raise your knee higher than the level of your hip.   Do not sit in low chairs. Add extra cushions to increase the height of your chairs or couch.   Do not pivot. Take short steps when you turn.  Contact your caregiver for follow-up as directed and if you have any other questions or concerns.  SEEK IMMEDIATE MEDICAL CARE IF:   Your hip dislocates again.   You develop severe pain in your hip, unrelieved by medicine.   You have numbness, tingling, temperature changes, or discoloration of your leg or foot.   You have difficulty breathing.   You develop any signs of allergy to medicine such as hives, skin rash, itching, nausea, and vomiting.     This information is not intended to replace advice given to you by your health care provider. Make sure you discuss any questions you have with your health care provider.     Document Released: 04/08/2004 Document Revised: 05/24/2011 Document Reviewed: 04/05/2008  Elsevier Interactive Patient Education 2016 [employer]

## 2015-09-23 NOTE — Progress Notes (Signed)
CM met with pt to offer choice of home health agency.  Pt chooses [facility] to render HHPT.  Pt states she has all DME needed at home.  Referral given to [employer] rep, Tim (on unit).  No other CM needs were communicated.  No other CM needs were communicated.

## 2015-09-24 DIAGNOSIS — M9902 Segmental and somatic dysfunction of thoracic region: Secondary | ICD-10-CM | POA: Diagnosis not present

## 2015-09-24 DIAGNOSIS — M9903 Segmental and somatic dysfunction of lumbar region: Secondary | ICD-10-CM | POA: Diagnosis not present

## 2015-09-24 DIAGNOSIS — M9901 Segmental and somatic dysfunction of cervical region: Secondary | ICD-10-CM | POA: Diagnosis not present

## 2015-09-25 DIAGNOSIS — N182 Chronic kidney disease, stage 2 (mild): Secondary | ICD-10-CM | POA: Diagnosis not present

## 2015-09-25 DIAGNOSIS — I351 Nonrheumatic aortic (valve) insufficiency: Secondary | ICD-10-CM | POA: Diagnosis not present

## 2015-09-25 DIAGNOSIS — Z96651 Presence of right artificial knee joint: Secondary | ICD-10-CM | POA: Diagnosis not present

## 2015-09-25 DIAGNOSIS — I129 Hypertensive chronic kidney disease with stage 1 through stage 4 chronic kidney disease, or unspecified chronic kidney disease: Secondary | ICD-10-CM | POA: Diagnosis not present

## 2015-09-25 DIAGNOSIS — M1990 Unspecified osteoarthritis, unspecified site: Secondary | ICD-10-CM | POA: Diagnosis not present

## 2015-09-25 DIAGNOSIS — T84021D Dislocation of internal left hip prosthesis, subsequent encounter: Secondary | ICD-10-CM | POA: Diagnosis not present

## 2015-09-30 DIAGNOSIS — T84021D Dislocation of internal left hip prosthesis, subsequent encounter: Secondary | ICD-10-CM | POA: Diagnosis not present

## 2015-09-30 DIAGNOSIS — I351 Nonrheumatic aortic (valve) insufficiency: Secondary | ICD-10-CM | POA: Diagnosis not present

## 2015-09-30 DIAGNOSIS — M1990 Unspecified osteoarthritis, unspecified site: Secondary | ICD-10-CM | POA: Diagnosis not present

## 2015-09-30 DIAGNOSIS — I129 Hypertensive chronic kidney disease with stage 1 through stage 4 chronic kidney disease, or unspecified chronic kidney disease: Secondary | ICD-10-CM | POA: Diagnosis not present

## 2015-09-30 DIAGNOSIS — N182 Chronic kidney disease, stage 2 (mild): Secondary | ICD-10-CM | POA: Diagnosis not present

## 2015-09-30 DIAGNOSIS — Z96651 Presence of right artificial knee joint: Secondary | ICD-10-CM | POA: Diagnosis not present

## 2015-10-02 DIAGNOSIS — N182 Chronic kidney disease, stage 2 (mild): Secondary | ICD-10-CM | POA: Diagnosis not present

## 2015-10-02 DIAGNOSIS — Z96651 Presence of right artificial knee joint: Secondary | ICD-10-CM | POA: Diagnosis not present

## 2015-10-02 DIAGNOSIS — I351 Nonrheumatic aortic (valve) insufficiency: Secondary | ICD-10-CM | POA: Diagnosis not present

## 2015-10-02 DIAGNOSIS — M1990 Unspecified osteoarthritis, unspecified site: Secondary | ICD-10-CM | POA: Diagnosis not present

## 2015-10-02 DIAGNOSIS — T84021D Dislocation of internal left hip prosthesis, subsequent encounter: Secondary | ICD-10-CM | POA: Diagnosis not present

## 2015-10-02 DIAGNOSIS — I129 Hypertensive chronic kidney disease with stage 1 through stage 4 chronic kidney disease, or unspecified chronic kidney disease: Secondary | ICD-10-CM | POA: Diagnosis not present

## 2015-10-06 DIAGNOSIS — I129 Hypertensive chronic kidney disease with stage 1 through stage 4 chronic kidney disease, or unspecified chronic kidney disease: Secondary | ICD-10-CM | POA: Diagnosis not present

## 2015-10-06 DIAGNOSIS — T84021D Dislocation of internal left hip prosthesis, subsequent encounter: Secondary | ICD-10-CM | POA: Diagnosis not present

## 2015-10-06 DIAGNOSIS — N182 Chronic kidney disease, stage 2 (mild): Secondary | ICD-10-CM | POA: Diagnosis not present

## 2015-10-06 DIAGNOSIS — I351 Nonrheumatic aortic (valve) insufficiency: Secondary | ICD-10-CM | POA: Diagnosis not present

## 2015-10-06 DIAGNOSIS — M1990 Unspecified osteoarthritis, unspecified site: Secondary | ICD-10-CM | POA: Diagnosis not present

## 2015-10-06 DIAGNOSIS — Z96651 Presence of right artificial knee joint: Secondary | ICD-10-CM | POA: Diagnosis not present

## 2015-10-07 DIAGNOSIS — Z471 Aftercare following joint replacement surgery: Secondary | ICD-10-CM | POA: Diagnosis not present

## 2015-10-07 DIAGNOSIS — Z96642 Presence of left artificial hip joint: Secondary | ICD-10-CM | POA: Diagnosis not present

## 2015-10-10 DIAGNOSIS — I129 Hypertensive chronic kidney disease with stage 1 through stage 4 chronic kidney disease, or unspecified chronic kidney disease: Secondary | ICD-10-CM | POA: Diagnosis not present

## 2015-10-10 DIAGNOSIS — Z96651 Presence of right artificial knee joint: Secondary | ICD-10-CM | POA: Diagnosis not present

## 2015-10-10 DIAGNOSIS — M1990 Unspecified osteoarthritis, unspecified site: Secondary | ICD-10-CM | POA: Diagnosis not present

## 2015-10-10 DIAGNOSIS — N182 Chronic kidney disease, stage 2 (mild): Secondary | ICD-10-CM | POA: Diagnosis not present

## 2015-10-10 DIAGNOSIS — T84021D Dislocation of internal left hip prosthesis, subsequent encounter: Secondary | ICD-10-CM | POA: Diagnosis not present

## 2015-10-10 DIAGNOSIS — I351 Nonrheumatic aortic (valve) insufficiency: Secondary | ICD-10-CM | POA: Diagnosis not present

## 2015-10-17 DIAGNOSIS — N182 Chronic kidney disease, stage 2 (mild): Secondary | ICD-10-CM | POA: Diagnosis not present

## 2015-10-17 DIAGNOSIS — I471 Supraventricular tachycardia: Secondary | ICD-10-CM | POA: Diagnosis not present

## 2015-10-17 DIAGNOSIS — I712 Thoracic aortic aneurysm, without rupture: Secondary | ICD-10-CM | POA: Diagnosis not present

## 2015-10-17 DIAGNOSIS — E782 Mixed hyperlipidemia: Secondary | ICD-10-CM | POA: Diagnosis not present

## 2015-10-17 DIAGNOSIS — Z0000 Encounter for general adult medical examination without abnormal findings: Secondary | ICD-10-CM | POA: Diagnosis not present

## 2015-10-17 DIAGNOSIS — D638 Anemia in other chronic diseases classified elsewhere: Secondary | ICD-10-CM | POA: Diagnosis not present

## 2015-10-17 DIAGNOSIS — I351 Nonrheumatic aortic (valve) insufficiency: Secondary | ICD-10-CM | POA: Diagnosis not present

## 2015-10-17 DIAGNOSIS — Z1389 Encounter for screening for other disorder: Secondary | ICD-10-CM | POA: Diagnosis not present

## 2015-10-22 DIAGNOSIS — M9903 Segmental and somatic dysfunction of lumbar region: Secondary | ICD-10-CM | POA: Diagnosis not present

## 2015-10-22 DIAGNOSIS — M9902 Segmental and somatic dysfunction of thoracic region: Secondary | ICD-10-CM | POA: Diagnosis not present

## 2015-10-22 DIAGNOSIS — M9901 Segmental and somatic dysfunction of cervical region: Secondary | ICD-10-CM | POA: Diagnosis not present

## 2015-10-24 DIAGNOSIS — Z1211 Encounter for screening for malignant neoplasm of colon: Secondary | ICD-10-CM | POA: Diagnosis not present

## 2015-11-03 DIAGNOSIS — M9901 Segmental and somatic dysfunction of cervical region: Secondary | ICD-10-CM | POA: Diagnosis not present

## 2015-11-03 DIAGNOSIS — M9903 Segmental and somatic dysfunction of lumbar region: Secondary | ICD-10-CM | POA: Diagnosis not present

## 2015-11-03 DIAGNOSIS — M9902 Segmental and somatic dysfunction of thoracic region: Secondary | ICD-10-CM | POA: Diagnosis not present

## 2015-11-19 DIAGNOSIS — M9903 Segmental and somatic dysfunction of lumbar region: Secondary | ICD-10-CM | POA: Diagnosis not present

## 2015-11-19 DIAGNOSIS — M9901 Segmental and somatic dysfunction of cervical region: Secondary | ICD-10-CM | POA: Diagnosis not present

## 2015-11-19 DIAGNOSIS — M9902 Segmental and somatic dysfunction of thoracic region: Secondary | ICD-10-CM | POA: Diagnosis not present

## 2015-11-21 DIAGNOSIS — L400 Psoriasis vulgaris: Secondary | ICD-10-CM | POA: Diagnosis not present

## 2015-11-21 DIAGNOSIS — L570 Actinic keratosis: Secondary | ICD-10-CM | POA: Diagnosis not present

## 2015-11-21 DIAGNOSIS — L821 Other seborrheic keratosis: Secondary | ICD-10-CM | POA: Diagnosis not present

## 2015-11-21 DIAGNOSIS — L309 Dermatitis, unspecified: Secondary | ICD-10-CM | POA: Diagnosis not present

## 2015-11-21 DIAGNOSIS — L814 Other melanin hyperpigmentation: Secondary | ICD-10-CM | POA: Diagnosis not present

## 2015-11-21 DIAGNOSIS — Z85828 Personal history of other malignant neoplasm of skin: Secondary | ICD-10-CM | POA: Diagnosis not present

## 2015-11-30 DIAGNOSIS — Z23 Encounter for immunization: Secondary | ICD-10-CM | POA: Diagnosis not present

## 2015-12-02 ENCOUNTER — Other Ambulatory Visit: Payer: Self-pay | Admitting: *Deleted

## 2015-12-02 DIAGNOSIS — I7121 Aneurysm of the ascending aorta, without rupture: Secondary | ICD-10-CM

## 2015-12-02 DIAGNOSIS — I712 Thoracic aortic aneurysm, without rupture: Secondary | ICD-10-CM

## 2015-12-03 DIAGNOSIS — M9901 Segmental and somatic dysfunction of cervical region: Secondary | ICD-10-CM | POA: Diagnosis not present

## 2015-12-03 DIAGNOSIS — M9902 Segmental and somatic dysfunction of thoracic region: Secondary | ICD-10-CM | POA: Diagnosis not present

## 2015-12-03 DIAGNOSIS — M9903 Segmental and somatic dysfunction of lumbar region: Secondary | ICD-10-CM | POA: Diagnosis not present

## 2015-12-16 DIAGNOSIS — M9901 Segmental and somatic dysfunction of cervical region: Secondary | ICD-10-CM | POA: Diagnosis not present

## 2015-12-16 DIAGNOSIS — M9903 Segmental and somatic dysfunction of lumbar region: Secondary | ICD-10-CM | POA: Diagnosis not present

## 2015-12-16 DIAGNOSIS — M9902 Segmental and somatic dysfunction of thoracic region: Secondary | ICD-10-CM | POA: Diagnosis not present

## 2016-01-07 ENCOUNTER — Encounter: Payer: Medicare Other | Admitting: Surgery

## 2016-01-14 ENCOUNTER — Encounter: Payer: Medicare Other | Admitting: Surgery

## 2016-01-14 DIAGNOSIS — M9903 Segmental and somatic dysfunction of lumbar region: Secondary | ICD-10-CM | POA: Diagnosis not present

## 2016-01-14 DIAGNOSIS — M9901 Segmental and somatic dysfunction of cervical region: Secondary | ICD-10-CM | POA: Diagnosis not present

## 2016-01-14 DIAGNOSIS — M9902 Segmental and somatic dysfunction of thoracic region: Secondary | ICD-10-CM | POA: Diagnosis not present

## 2016-01-28 DIAGNOSIS — M9902 Segmental and somatic dysfunction of thoracic region: Secondary | ICD-10-CM | POA: Diagnosis not present

## 2016-01-28 DIAGNOSIS — M9903 Segmental and somatic dysfunction of lumbar region: Secondary | ICD-10-CM | POA: Diagnosis not present

## 2016-01-28 DIAGNOSIS — M9901 Segmental and somatic dysfunction of cervical region: Secondary | ICD-10-CM | POA: Diagnosis not present

## 2016-02-17 DIAGNOSIS — M9903 Segmental and somatic dysfunction of lumbar region: Secondary | ICD-10-CM | POA: Diagnosis not present

## 2016-02-17 DIAGNOSIS — M9902 Segmental and somatic dysfunction of thoracic region: Secondary | ICD-10-CM | POA: Diagnosis not present

## 2016-02-17 DIAGNOSIS — M9901 Segmental and somatic dysfunction of cervical region: Secondary | ICD-10-CM | POA: Diagnosis not present

## 2016-03-16 DIAGNOSIS — M9901 Segmental and somatic dysfunction of cervical region: Secondary | ICD-10-CM | POA: Diagnosis not present

## 2016-03-16 DIAGNOSIS — M9902 Segmental and somatic dysfunction of thoracic region: Secondary | ICD-10-CM | POA: Diagnosis not present

## 2016-03-16 DIAGNOSIS — M9903 Segmental and somatic dysfunction of lumbar region: Secondary | ICD-10-CM | POA: Diagnosis not present

## 2016-03-30 DIAGNOSIS — M9903 Segmental and somatic dysfunction of lumbar region: Secondary | ICD-10-CM | POA: Diagnosis not present

## 2016-03-30 DIAGNOSIS — M9902 Segmental and somatic dysfunction of thoracic region: Secondary | ICD-10-CM | POA: Diagnosis not present

## 2016-03-30 DIAGNOSIS — M9901 Segmental and somatic dysfunction of cervical region: Secondary | ICD-10-CM | POA: Diagnosis not present

## 2016-04-09 ENCOUNTER — Encounter: Payer: Self-pay | Admitting: Interventional Cardiology

## 2016-04-09 ENCOUNTER — Ambulatory Visit (INDEPENDENT_AMBULATORY_CARE_PROVIDER_SITE_OTHER): Payer: Medicare Other | Admitting: Interventional Cardiology

## 2016-04-09 VITALS — BP 170/84 | HR 62 | Ht 64.0 in | Wt 151.4 lb

## 2016-04-09 DIAGNOSIS — I712 Thoracic aortic aneurysm, without rupture, unspecified: Secondary | ICD-10-CM

## 2016-04-09 DIAGNOSIS — I10 Essential (primary) hypertension: Secondary | ICD-10-CM

## 2016-04-09 DIAGNOSIS — I308 Other forms of acute pericarditis: Secondary | ICD-10-CM | POA: Diagnosis not present

## 2016-04-09 DIAGNOSIS — I351 Nonrheumatic aortic (valve) insufficiency: Secondary | ICD-10-CM

## 2016-04-09 NOTE — Progress Notes (Signed)
Cardiology Office Note    Date:  04/09/2016   ID:  EMANUELLA SCHLEIMER, DOB 10-Jan-1930, MRN [id number]  PCP:  Kandice Hams, MD  Cardiologist: Sinclair Grooms, MD   Chief Complaint  Patient presents with  . Thoracic Aortic Aneurysm  . Cardiac Valve Problem    History of Present Illness:  [name] is a 83 y.o. female who presents for ascending aortic aneurysm (4.6 cm in diameter), hypertension, mild aortic regurgitation, and history of paroxysmal SVT.  We had a nice conversation today. I challenged her on the decision to stop monitoring the ascending aortic aneurysm. She refuses to go back to surgery comanagement because she will not allow either endovascular or open repair should it be required. She says her husband is aware of this decision and agrees.  They have been out of town and unable to control salt in the diet. She has no cardiac complaints. She denies dyspnea. She has not had syncope.   Past Medical History:  Diagnosis Date  . Anemia    CHRONIC  . Aortic insufficiency    a. 2D echo 08/29/15: EF 55-60%, mild LVH, diastolic dysfunction, elevated LV filling pressure, mild AI, severe LAE, mild RAE, mild TR, dilated descending thoracic aorta just distal to the takeoff of the left subclavian, measuring 4.1 cm, no pericardial effusion.  . Arthritis    OA-SOME BACK AND NECK PAIN,  GOES TO CHIROPRACTOR TWICE A MONTH;  HX OF JOINT REPLACEMENTS   . Cancer ([place])    SKIN CANCERS REMOVED FROM LEGS  . CKD (chronic kidney disease), stage II   . Essential hypertension   . GERD (gastroesophageal reflux disease)   . PSVT (paroxysmal supraventricular tachycardia) ([place])   . Recurrent Pericarditis    RECURRENT   . Thoracic aneurysm    a. Followed by [facility] -last seen in 12/2013 w/ plan for f/u MRA and thoracic surgery f/u in 2 yrs, but patient does not wish to follow any longer.    Past Surgical History:  Procedure Laterality Date  . APPENDECTOMY    . EXCISION/RELEASE  BURSA HIP Left 11/24/2012   Procedure: LEFT HIP BURSECTOMY AND TENDON REPAIR ;  Surgeon: Gearlean Alf, MD;  Location: [facility];  Service: Orthopedics;  Laterality: Left;  . HIP CLOSED REDUCTION Left 12/19/2012   Procedure: CLOSED MANIPULATION HIP;  Surgeon: Mauri Pole, MD;  Location: [facility];  Service: Orthopedics;  Laterality: Left;  . HIP CLOSED REDUCTION Left 12/05/2013   Procedure: CLOSED MANIPULATION HIP;  Surgeon: Augustin Schooling, MD;  Location: [facility];  Service: Orthopedics;  Laterality: Left;  . HIP CLOSED REDUCTION Left 09/22/2015   Procedure: CLOSED REDUCTION HIP;  Surgeon: Melina Schools, MD;  Location: [facility];  Service: Orthopedics;  Laterality: Left;  . JOINT REPLACEMENT     LEFT TOTAL HIP REPLACEMENT AND REVISIONS X 2  . OOPHORECTOMY     has a partial of one ovary remaingin  . PELVIC LAPAROSCOPY     ovarian cyst removal,   . REVISION TOTAL HIP ARTHROPLASTY     left  . right Achilles Tendon repair  5/13  . TOTAL KNEE ARTHROPLASTY     right  . TOTAL KNEE ARTHROPLASTY     left  . VAGINAL HYSTERECTOMY     with ovarian cyst removal    Current Medications: Outpatient Medications Prior to Visit  Medication Sig Dispense Refill  . aspirin 81 MG tablet Take 81 mg by mouth daily.    Marland Kitchen  Calcium Carbonate-Vit D-Min (CALCIUM 1200 PO) Take 1 tablet by mouth daily.     . carboxymethylcellulose (REFRESH TEARS) 0.5 % SOLN Place 1 drop into both eyes daily.     . carvedilol (COREG) 12.5 MG tablet Take 6.25-12.5 mg by mouth 2 (two) times daily. Takes 1/2 tablet in am and 1 tablet at night    . cholecalciferol (VITAMIN D) 1000 UNITS tablet Take 1,000 Units by mouth daily.    . clobetasol cream (TEMOVATE) [id number] % Apply 1 application topically 2 (two) times daily as needed (itching).     Marland Kitchen diltiazem (CARDIZEM CD) 240 MG 24 hr capsule Take 240 mg by mouth every evening.     . estrogens, conjugated, (PREMARIN) 0.3 MG tablet Take 1 tablet (0.3 mg total) by mouth daily. 90 tablet 4  . meloxicam  (MOBIC) 7.5 MG tablet Take 7.5 mg by mouth daily as needed for pain.     . mupirocin ointment (BACTROBAN) 2 % Apply 1 application topically 2 (two) times daily. (Patient taking differently: Apply 1 application topically 2 (two) times daily as needed (for cuts). ) 30 g 0  . omega-3 acid ethyl esters (LOVAZA) 1 g capsule Take 2 g by mouth daily.    . valsartan (DIOVAN) 160 MG tablet Take 160 mg by mouth daily as needed (Systolic BP > [id number]).    Marland Kitchen HYDROcodone-acetaminophen (NORCO) 10-325 MG tablet Take 1-2 tablets by mouth every 4 (four) hours as needed (breakthrough pain). (Patient not taking: Reported on 04/09/2016) 50 tablet 0   No facility-administered medications prior to visit.      Allergies:   Cephalexin; Keflex [cephalexin]; Shellfish allergy; Clindamycin/lincomycin; Sulfites; Ciprofloxacin; and Sulfa antibiotics   Social History   Social History  . Marital status: Married    Spouse name: N/A  . Number of children: N/A  . Years of education: N/A   Social History Main Topics  . Smoking status: Former Smoker    Types: Cigarettes  . Smokeless tobacco: Never Used     Comment: quit age 81  . Alcohol use No  . Drug use: No  . Sexual activity: Not Currently    Birth control/ protection: Surgical     Comment: HYST-1st intercourse 83 yo-Fewer than 5 partners   Other Topics Concern  . None   Social History Narrative  . None     Family History:  The patient's family history includes Heart attack (age of onset: 76) in her father; Hypertension in her mother; Stroke in her mother.   ROS:   Please see the history of present illness.    Back, hip, and knee discomfort. Somewhat stressed by having to increasingly care for her husband.  All other systems reviewed and are negative.   PHYSICAL EXAM:   VS:  BP (!) 170/84 (BP Location: Left Arm)   Pulse 62   Ht 5\' 4"  (1.626 m)   Wt 151 lb 6.4 oz (68.7 kg)   BMI 25.99 kg/m    GEN: Well nourished, well developed, in no acute distress    HEENT: normal  Neck: no JVD, carotid bruits, or masses Cardiac: RRR;  rubs, or gallops.. 2-3/6 decrescendo diastolic murmur of aortic regurgitation. Trace bilateral lower extremity edema. Respiratory:  clear to auscultation bilaterally, normal work of breathing GI: soft, nontender, nondistended, + BS MS: no deformity or atrophy  Skin: warm and dry, no rash Neuro:  Alert and Oriented x 3, Strength and sensation are intact Psych: euthymic mood, full affect  Wt Readings from  Last 3 Encounters:  04/09/16 151 lb 6.4 oz (68.7 kg)  09/22/15 150 lb (68 kg)  09/04/15 156 lb 6.4 oz (70.9 kg)      Studies/Labs Reviewed:   EKG:  EKG  None performed today. In June 2017 the EKG demonstrated sinus bradycardia, septal infarct pattern, left atrial abnormality.  Recent Labs: 08/28/2015: BUN 30; Creat 1.12; Hemoglobin 9.8; Platelets 247; Potassium 4.8; Sodium 134   Lipid Panel No results found for: CHOL, TRIG, HDL, CHOLHDL, VLDL, LDLCALC, LDLDIRECT  Additional studies/ records that were reviewed today include:  Last thoracic aneurysm follow-up with Dr. Gilford Raid was October 2015 when MRI demonstrated: IMPRESSION: Fusiform aneurysmal dilatation of the ascending thoracic aorta measuring approximately 45 mm in maximal diameter, grossly unchanged since the 2011 examination.  Echocardiogram 08/29/15: Study Conclusions  - Left ventricle: The cavity size was normal. Wall thickness was   increased in a pattern of mild LVH. Systolic function was normal.   The estimated ejection fraction was in the range of 55% to 60%.   Wall motion was normal; there were no regional wall motion   abnormalities. Doppler parameters are consistent with abnormal   left ventricular relaxation (grade 1 diastolic dysfunction). The   E/e&' ratio is >15, suggesting elevated LV filling pressure. - Aortic valve: Trileaflet; mildly calcified leaflets. There was   mild regurgitation. - Mitral valve: Mildly thickened  leaflets . There was trivial   regurgitation. - Left atrium: Severely dilated. The atrium was normal in size. - Right ventricle: The cavity size was mildly dilated. Wall   thickness was normal. Systolic function is reduced. - Right atrium: The atrium was mildly dilated. - Tricuspid valve: There was mild regurgitation. - Pulmonary arteries: PA peak pressure: 19 mm Hg (S). - Inferior vena cava: The vessel was normal in size. The   respirophasic diameter changes were in the normal range (>= 50%),   consistent with normal central venous pressure. - Pericardium, extracardiac: The descending thoracic aorta measures   up to 4.1 cm, just distal to the takeoff of the left subclavian.   There was no pericardial effusion.  Impressions:  - LVEF 55-60%, mild LVH, normal wall motion, diastolic dysfunction,   elevated LV filling pressure, mild central AI (trileaflet, no   stenosis), trivial MR, severe LAE, mild RAE, mild TR, normal   RVSP, dilated descending thoracic aorta just distal to the   takeoff of the left subclavian, measuring 4.1 cm, no pericardial   effusion.    ASSESSMENT:    1. Essential hypertension   2. Thoracic aortic aneurysm without rupture ([place])   3. Nonrheumatic aortic valve insufficiency   4. Other acute pericarditis      PLAN:  In order of problems listed above:  1. Very poor systolic blood pressure control, possibly dietary related. She believes she took medication this morning. Perhaps aortic regurgitation is worsening. She will record blood pressures at home over the next 2-3 weeks that reflect being back on her diet. Given the aortic regurgitation on a target blood pressure is less than 160 overnight he millimeters mercury. 2. The patient decided against organized annual or more frequent follow-up of the aneurysm. The decision is been made to forego endovascular and surgical repair should it be required. I bluntly asked her that if she developed an emergency  pre-rupture syndrome or dissection which she allow emergency surgery and she said no. She further states that this is been discussed with her husband. 3. Regurgitation is still clinically insignificant. Likely related to  dilated aortic annulus in the setting of ascending aortic aneurysm. 4. No recent recurrence of pericarditis. Plan conservative management.  This was a prolonged office visit because we discussed the natural history of ascending aortic aneurysm. The visit lasting greater than 30 minutes. Greater than 50% of the time was spent in counseling and documentation of the patient's desires concerning management of this problem. She has chosen medical therapy and will not allow endovascular or surgical intervention. She does understand that should she change her mind, management can be what ever is appropriate.  Medication Adjustments/Labs and Tests Ordered: Current medicines are reviewed at length with the patient today.  Concerns regarding medicines are outlined above.  Medication changes, Labs and Tests ordered today are listed in the Patient Instructions below. There are no Patient Instructions on file for this visit.   Signed, Sinclair Grooms, MD  04/09/2016 11:53 AM    [facility] [address], [place], [place]  [address] Phone: [phone]; Fax: [phone]

## 2016-04-09 NOTE — Patient Instructions (Signed)
Medication Instructions:  None  Labwork: None  Testing/Procedures: None  Follow-Up: Your physician wants you to follow-up in: 9-12 months with Dr. Tamala Julian. You will receive a reminder letter in the mail two months in advance. If you don't receive a letter, please call our office to schedule the follow-up appointment.   Any Other Special Instructions Will Be Listed Below (If Applicable).  Monitor your blood pressure about 2 hours after you take your medications.  Call our office in 2 weeks with those readings.    If you need a refill on your cardiac medications before your next appointment, please call your pharmacy.

## 2016-04-21 DIAGNOSIS — M9901 Segmental and somatic dysfunction of cervical region: Secondary | ICD-10-CM | POA: Diagnosis not present

## 2016-04-21 DIAGNOSIS — M9902 Segmental and somatic dysfunction of thoracic region: Secondary | ICD-10-CM | POA: Diagnosis not present

## 2016-04-21 DIAGNOSIS — M9903 Segmental and somatic dysfunction of lumbar region: Secondary | ICD-10-CM | POA: Diagnosis not present

## 2016-05-04 DIAGNOSIS — M9901 Segmental and somatic dysfunction of cervical region: Secondary | ICD-10-CM | POA: Diagnosis not present

## 2016-05-04 DIAGNOSIS — M9902 Segmental and somatic dysfunction of thoracic region: Secondary | ICD-10-CM | POA: Diagnosis not present

## 2016-05-04 DIAGNOSIS — M9903 Segmental and somatic dysfunction of lumbar region: Secondary | ICD-10-CM | POA: Diagnosis not present

## 2016-05-11 DIAGNOSIS — M9901 Segmental and somatic dysfunction of cervical region: Secondary | ICD-10-CM | POA: Diagnosis not present

## 2016-05-11 DIAGNOSIS — M9902 Segmental and somatic dysfunction of thoracic region: Secondary | ICD-10-CM | POA: Diagnosis not present

## 2016-05-11 DIAGNOSIS — M9903 Segmental and somatic dysfunction of lumbar region: Secondary | ICD-10-CM | POA: Diagnosis not present

## 2016-05-12 DIAGNOSIS — M9902 Segmental and somatic dysfunction of thoracic region: Secondary | ICD-10-CM | POA: Diagnosis not present

## 2016-05-12 DIAGNOSIS — M9901 Segmental and somatic dysfunction of cervical region: Secondary | ICD-10-CM | POA: Diagnosis not present

## 2016-05-12 DIAGNOSIS — M9903 Segmental and somatic dysfunction of lumbar region: Secondary | ICD-10-CM | POA: Diagnosis not present

## 2016-05-14 ENCOUNTER — Other Ambulatory Visit: Payer: Self-pay | Admitting: Gynecology

## 2016-05-18 DIAGNOSIS — M1811 Unilateral primary osteoarthritis of first carpometacarpal joint, right hand: Secondary | ICD-10-CM | POA: Diagnosis not present

## 2016-05-18 DIAGNOSIS — M19031 Primary osteoarthritis, right wrist: Secondary | ICD-10-CM | POA: Diagnosis not present

## 2016-05-18 DIAGNOSIS — S62112A Displaced fracture of triquetrum [cuneiform] bone, left wrist, initial encounter for closed fracture: Secondary | ICD-10-CM | POA: Diagnosis not present

## 2016-05-26 DIAGNOSIS — M9902 Segmental and somatic dysfunction of thoracic region: Secondary | ICD-10-CM | POA: Diagnosis not present

## 2016-05-26 DIAGNOSIS — M9903 Segmental and somatic dysfunction of lumbar region: Secondary | ICD-10-CM | POA: Diagnosis not present

## 2016-05-26 DIAGNOSIS — M9901 Segmental and somatic dysfunction of cervical region: Secondary | ICD-10-CM | POA: Diagnosis not present

## 2016-06-03 DIAGNOSIS — Z471 Aftercare following joint replacement surgery: Secondary | ICD-10-CM | POA: Diagnosis not present

## 2016-06-03 DIAGNOSIS — Z96651 Presence of right artificial knee joint: Secondary | ICD-10-CM | POA: Diagnosis not present

## 2016-06-03 DIAGNOSIS — Z96653 Presence of artificial knee joint, bilateral: Secondary | ICD-10-CM | POA: Diagnosis not present

## 2016-06-03 DIAGNOSIS — Z96652 Presence of left artificial knee joint: Secondary | ICD-10-CM | POA: Diagnosis not present

## 2016-06-09 DIAGNOSIS — M9903 Segmental and somatic dysfunction of lumbar region: Secondary | ICD-10-CM | POA: Diagnosis not present

## 2016-06-09 DIAGNOSIS — M9902 Segmental and somatic dysfunction of thoracic region: Secondary | ICD-10-CM | POA: Diagnosis not present

## 2016-06-09 DIAGNOSIS — M9901 Segmental and somatic dysfunction of cervical region: Secondary | ICD-10-CM | POA: Diagnosis not present

## 2016-06-17 DIAGNOSIS — H26491 Other secondary cataract, right eye: Secondary | ICD-10-CM | POA: Diagnosis not present

## 2016-06-17 DIAGNOSIS — Z961 Presence of intraocular lens: Secondary | ICD-10-CM | POA: Diagnosis not present

## 2016-06-17 DIAGNOSIS — H353112 Nonexudative age-related macular degeneration, right eye, intermediate dry stage: Secondary | ICD-10-CM | POA: Diagnosis not present

## 2016-06-17 DIAGNOSIS — H43813 Vitreous degeneration, bilateral: Secondary | ICD-10-CM | POA: Diagnosis not present

## 2016-06-23 DIAGNOSIS — M9903 Segmental and somatic dysfunction of lumbar region: Secondary | ICD-10-CM | POA: Diagnosis not present

## 2016-06-23 DIAGNOSIS — M9902 Segmental and somatic dysfunction of thoracic region: Secondary | ICD-10-CM | POA: Diagnosis not present

## 2016-06-23 DIAGNOSIS — M9901 Segmental and somatic dysfunction of cervical region: Secondary | ICD-10-CM | POA: Diagnosis not present

## 2016-06-25 ENCOUNTER — Other Ambulatory Visit: Payer: Self-pay | Admitting: Internal Medicine

## 2016-06-25 DIAGNOSIS — Z1231 Encounter for screening mammogram for malignant neoplasm of breast: Secondary | ICD-10-CM

## 2016-07-02 ENCOUNTER — Telehealth: Payer: Self-pay | Admitting: Interventional Cardiology

## 2016-07-05 DIAGNOSIS — M9902 Segmental and somatic dysfunction of thoracic region: Secondary | ICD-10-CM | POA: Diagnosis not present

## 2016-07-05 DIAGNOSIS — M9901 Segmental and somatic dysfunction of cervical region: Secondary | ICD-10-CM | POA: Diagnosis not present

## 2016-07-05 DIAGNOSIS — M9903 Segmental and somatic dysfunction of lumbar region: Secondary | ICD-10-CM | POA: Diagnosis not present

## 2016-07-06 MED ORDER — DILTIAZEM HCL ER COATED BEADS 240 MG PO CP24: 240.0000 mg | ORAL_CAPSULE | Freq: Every evening | ORAL | 3 refills | Status: DC

## 2016-07-06 NOTE — Telephone Encounter (Signed)
New Message    Reference [id number]  diltiazem (CARDIZEM CD) 240 MG 24 hr capsule Takes 1/2 tablet in am and 1 tablet at night   This is a capsule not a tablet, do they needs to change the directions or make prescription for a different medication

## 2016-07-06 NOTE — Telephone Encounter (Signed)
Spoke with pt and clarified that she is only taking Diltiazem 240mg  once daily.  Advised I would send correct prescription to pharmacy.  Pt appreciative for call.   Spoke with representative at [facility] and they will remove current prescription stating take 1/2 tab in AM and 1 tab in PM and await new prescription.  New prescription sent.

## 2016-07-14 DIAGNOSIS — H0289 Other specified disorders of eyelid: Secondary | ICD-10-CM | POA: Diagnosis not present

## 2016-07-14 DIAGNOSIS — H353131 Nonexudative age-related macular degeneration, bilateral, early dry stage: Secondary | ICD-10-CM | POA: Diagnosis not present

## 2016-07-19 DIAGNOSIS — M9902 Segmental and somatic dysfunction of thoracic region: Secondary | ICD-10-CM | POA: Diagnosis not present

## 2016-07-19 DIAGNOSIS — M9903 Segmental and somatic dysfunction of lumbar region: Secondary | ICD-10-CM | POA: Diagnosis not present

## 2016-07-19 DIAGNOSIS — M9901 Segmental and somatic dysfunction of cervical region: Secondary | ICD-10-CM | POA: Diagnosis not present

## 2016-07-23 DIAGNOSIS — Z85828 Personal history of other malignant neoplasm of skin: Secondary | ICD-10-CM | POA: Diagnosis not present

## 2016-07-23 DIAGNOSIS — C44722 Squamous cell carcinoma of skin of right lower limb, including hip: Secondary | ICD-10-CM | POA: Diagnosis not present

## 2016-08-04 ENCOUNTER — Ambulatory Visit
Admission: RE | Admit: 2016-08-04 | Discharge: 2016-08-04 | Disposition: A | Discharge status: Home / Self Care | Payer: Medicare Other | Source: Ambulatory Visit | Attending: Internal Medicine | Admitting: Internal Medicine

## 2016-08-04 DIAGNOSIS — Z1231 Encounter for screening mammogram for malignant neoplasm of breast: Secondary | ICD-10-CM | POA: Diagnosis not present

## 2016-08-05 DIAGNOSIS — M9901 Segmental and somatic dysfunction of cervical region: Secondary | ICD-10-CM | POA: Diagnosis not present

## 2016-08-05 DIAGNOSIS — M9903 Segmental and somatic dysfunction of lumbar region: Secondary | ICD-10-CM | POA: Diagnosis not present

## 2016-08-05 DIAGNOSIS — M9902 Segmental and somatic dysfunction of thoracic region: Secondary | ICD-10-CM | POA: Diagnosis not present

## 2016-08-18 DIAGNOSIS — M9903 Segmental and somatic dysfunction of lumbar region: Secondary | ICD-10-CM | POA: Diagnosis not present

## 2016-08-18 DIAGNOSIS — M9902 Segmental and somatic dysfunction of thoracic region: Secondary | ICD-10-CM | POA: Diagnosis not present

## 2016-08-18 DIAGNOSIS — M9901 Segmental and somatic dysfunction of cervical region: Secondary | ICD-10-CM | POA: Diagnosis not present

## 2016-08-23 ENCOUNTER — Ambulatory Visit (INDEPENDENT_AMBULATORY_CARE_PROVIDER_SITE_OTHER): Payer: Medicare Other | Admitting: Gynecology

## 2016-08-23 ENCOUNTER — Encounter: Payer: Self-pay | Admitting: Gynecology

## 2016-08-23 VITALS — BP 120/70 | Ht 64.0 in | Wt 153.0 lb

## 2016-08-23 DIAGNOSIS — N952 Postmenopausal atrophic vaginitis: Secondary | ICD-10-CM

## 2016-08-23 DIAGNOSIS — Z01411 Encounter for gynecological examination (general) (routine) with abnormal findings: Secondary | ICD-10-CM | POA: Diagnosis not present

## 2016-08-23 DIAGNOSIS — Z79890 Hormone replacement therapy (postmenopausal): Secondary | ICD-10-CM

## 2016-08-23 MED ORDER — ESTROGENS CONJUGATED 0.3 MG PO TABS: 0.3000 mg | ORAL_TABLET | Freq: Every day | ORAL | 4 refills | Status: DC

## 2016-08-23 NOTE — Patient Instructions (Signed)
Follow up in one year for annual exam 

## 2016-08-23 NOTE — Progress Notes (Signed)
    [name] 1929-11-15 [id number]        83 y.o.  G2P2 for annual exam.  Doing well. Continues on Premarin 0.3 mg daily.  Past medical history,surgical history, problem list, medications, allergies, family history and social history were all reviewed and documented as reviewed in the EPIC chart.  ROS:  Performed with pertinent positives and negatives included in the history, assessment and plan.   Additional significant findings :  None   Exam: Caryn Bee assistant Vitals:   08/23/16 1502  BP: 120/70  Weight: 153 lb (69.4 kg)  Height: 5\' 4"  (1.626 m)   Body mass index is 26.26 kg/m.  General appearance:  Normal affect, orientation and appearance. Skin: Grossly normal HEENT: Without gross lesions.  No cervical or supraclavicular adenopathy. Thyroid normal.  Lungs:  Clear without wheezing, rales or rhonchi Cardiac: RR, without RMG Abdominal:  Soft, nontender, without masses, guarding, rebound, organomegaly or hernia Breasts:  Examined lying and sitting without masses, retractions, discharge or axillary adenopathy. Pelvic:  Ext, BUS, Vagina: With atrophic changes  Adnexa: Without masses or tenderness    Anus and perineum: Normal   Rectovaginal: Normal sphincter tone without palpated masses or tenderness.    Assessment/Plan:  83 y.o. G2P2 female for annual exam.   1. Postmenopausal/atrophic genital changes. Continues on Premarin 0.3 mg daily. Status post TVH in the past. I again reviewed the latest data on HRT to include the 2017 NAMS guidelines. Symptom relief as well as cardiovascular and bone health when started early versus risks to include thrombosis such as stroke heart attack DVT and the breast cancer issue all reviewed. Patient feels very strongly that she wants to continue and I refilled her 1 year. 2. Pap smear 2012. No Pap smear done today. We both agree to stop screening per current screening guidelines based on age and hysterectomy history. No history of  abnormal Pap smears previously. 3. Mammography 07/2016. Continue with annual mammography. SBE monthly reviewed. Breast exam normal today. 4. Colonoscopy 10 years ago. Will follow up for colonoscopy per her primary/GI recommendations. 5. DEXA 2016 normal. Plan repeat DEXA at 5 year interval. 6. Health maintenance. No routine lab work done as patient does this elsewhere. Follow up 1 year, sooner as needed.   Anastasio Auerbach MD, 3:35 PM 08/23/2016

## 2016-09-01 DIAGNOSIS — M9903 Segmental and somatic dysfunction of lumbar region: Secondary | ICD-10-CM | POA: Diagnosis not present

## 2016-09-01 DIAGNOSIS — M9902 Segmental and somatic dysfunction of thoracic region: Secondary | ICD-10-CM | POA: Diagnosis not present

## 2016-09-01 DIAGNOSIS — M9901 Segmental and somatic dysfunction of cervical region: Secondary | ICD-10-CM | POA: Diagnosis not present

## 2016-09-27 DIAGNOSIS — M9902 Segmental and somatic dysfunction of thoracic region: Secondary | ICD-10-CM | POA: Diagnosis not present

## 2016-09-27 DIAGNOSIS — M9901 Segmental and somatic dysfunction of cervical region: Secondary | ICD-10-CM | POA: Diagnosis not present

## 2016-09-27 DIAGNOSIS — M9903 Segmental and somatic dysfunction of lumbar region: Secondary | ICD-10-CM | POA: Diagnosis not present

## 2016-10-18 DIAGNOSIS — M9902 Segmental and somatic dysfunction of thoracic region: Secondary | ICD-10-CM | POA: Diagnosis not present

## 2016-10-18 DIAGNOSIS — M9903 Segmental and somatic dysfunction of lumbar region: Secondary | ICD-10-CM | POA: Diagnosis not present

## 2016-10-18 DIAGNOSIS — M9901 Segmental and somatic dysfunction of cervical region: Secondary | ICD-10-CM | POA: Diagnosis not present

## 2016-10-20 DIAGNOSIS — D485 Neoplasm of uncertain behavior of skin: Secondary | ICD-10-CM | POA: Diagnosis not present

## 2016-10-20 DIAGNOSIS — Z85828 Personal history of other malignant neoplasm of skin: Secondary | ICD-10-CM | POA: Diagnosis not present

## 2016-10-20 DIAGNOSIS — L859 Epidermal thickening, unspecified: Secondary | ICD-10-CM | POA: Diagnosis not present

## 2016-11-01 DIAGNOSIS — M9901 Segmental and somatic dysfunction of cervical region: Secondary | ICD-10-CM | POA: Diagnosis not present

## 2016-11-01 DIAGNOSIS — M9903 Segmental and somatic dysfunction of lumbar region: Secondary | ICD-10-CM | POA: Diagnosis not present

## 2016-11-01 DIAGNOSIS — M9902 Segmental and somatic dysfunction of thoracic region: Secondary | ICD-10-CM | POA: Diagnosis not present

## 2016-11-08 ENCOUNTER — Encounter (HOSPITAL_COMMUNITY): Payer: Self-pay | Admitting: Emergency Medicine

## 2016-11-08 ENCOUNTER — Ambulatory Visit (HOSPITAL_COMMUNITY)
Admission: EM | Admit: 2016-11-08 | Discharge: 2016-11-08 | Disposition: A | Discharge status: Home / Self Care | Payer: Medicare Other | Attending: Family Medicine | Admitting: Family Medicine

## 2016-11-08 DIAGNOSIS — S0990XA Unspecified injury of head, initial encounter: Secondary | ICD-10-CM

## 2016-11-08 DIAGNOSIS — S41111A Laceration without foreign body of right upper arm, initial encounter: Secondary | ICD-10-CM | POA: Diagnosis not present

## 2016-11-08 DIAGNOSIS — S01111A Laceration without foreign body of right eyelid and periocular area, initial encounter: Secondary | ICD-10-CM | POA: Diagnosis not present

## 2016-11-08 MED ORDER — LIDOCAINE-EPINEPHRINE (PF) 2 %-1:200000 IJ SOLN
INTRAMUSCULAR | Status: AC
  Filled 2016-11-08: qty 20

## 2016-11-08 MED ORDER — BACITRACIN ZINC 500 UNIT/GM EX OINT
TOPICAL_OINTMENT | CUTANEOUS | Status: AC
  Filled 2016-11-08: qty 0.9

## 2016-11-08 NOTE — ED Triage Notes (Signed)
Patient says she tripped on uneven concrete.  Fell on concrete.  Laceration above right eyebrow.  Abrasion to left palm.  And abrasion to right shoulder.  No loc.  Patient is moving all extremities.

## 2016-11-08 NOTE — ED Provider Notes (Signed)
[facility]   [id number] 11/08/16 Arrival Time: 4742  ASSESSMENT & PLAN:  1. Eyebrow laceration, right, initial encounter   2. Skin tear of right upper arm without complication, initial encounter   3. Injury of head, initial encounter     Procedure: Verbal consent obtained. Patient provided with risks and alternatives to the procedure. Wound copiously irrigated with NS then cleansed with betadine. Anesthetized with 3 mL of lidocaine with epinephrine. Wound carefully explored. No foreign body or nonviable tissue noted. Using sterile technique, 8 interrupted 6-0 Prolene sutures were placed to reapproximate the wound. Patient tolerated procedure well. No complications. Minimal bleeding. Patient advised to look for and return for any signs of infection such as redness, swelling, discharge, or worsening pain. Return for suture removal in 7 days.  Local wound care instructions for R shoulder skin tear. Head injury precautions given.  OTC analgesics as needed. Reviewed expectations re: course of current medical issues. Questions answered. Outlined signs and symptoms indicating need for more acute intervention. Patient verbalized understanding. After Visit Summary given.   SUBJECTIVE:  [name] is a 83 y.o. female who reports falling this morning. Scraped her R shoulder and L knee. Laceration to R eyebrow. Drove herself home before coming here. No LOC reported. Feeling normal. No n/v/headache or visual changes reported. No h/o head injury. Moderate bleeding from eyebrow laceration, now stopped. No pain over R shoulder or L knee. Ambulatory without difficulty.  Td UTD: Yes  ROS: As per HPI.   OBJECTIVE:  Vitals:   11/08/16 1056  BP: 125/61  Pulse: 62  Resp: 20  Temp: 98 F (36.7 C)  TempSrc: Oral  SpO2: 97%     General appearance: alert; no distress Eye: PERRLA; EOMI; no orbital tenderness Neck: supple; no midline tenderness Ext: R shoulder with approx  1.5-2cm skin tear; no active bleeding; FROM; L knee with small anterior abrasion; FROM Skin: laceration of R eyebrow, curved and irregular; size: approx 3 cm; no active bleeding Neuro: grossly normal exam Psychological:  alert and cooperative; normal mood and affect   Allergies  Allergen Reactions  . Cephalexin Other (See Comments)    Stroke symptoms  . Keflex [Cephalexin] Other (See Comments)    Pt states eye problems with this medicine  . Shellfish Allergy Hives  . Clindamycin/Lincomycin Rash  . Sulfites   . Ciprofloxacin Rash    RASH  . Sulfa Antibiotics Other (See Comments)    Unknown    Past Medical History:  Diagnosis Date  . Anemia    CHRONIC  . Aortic insufficiency    a. 2D echo 08/29/15: EF 55-60%, mild LVH, diastolic dysfunction, elevated LV filling pressure, mild AI, severe LAE, mild RAE, mild TR, dilated descending thoracic aorta just distal to the takeoff of the left subclavian, measuring 4.1 cm, no pericardial effusion.  . Arthritis    OA-SOME BACK AND NECK PAIN,  GOES TO CHIROPRACTOR TWICE A MONTH;  HX OF JOINT REPLACEMENTS   . Cancer ([place])    SKIN CANCERS REMOVED FROM LEGS  . CKD (chronic kidney disease), stage II   . Essential hypertension   . GERD (gastroesophageal reflux disease)   . PSVT (paroxysmal supraventricular tachycardia) ([place])   . Recurrent Pericarditis    RECURRENT   . Thoracic aneurysm    a. Followed by [facility] -last seen in 12/2013 w/ plan for f/u MRA and thoracic surgery f/u in 2 yrs, but patient does not wish to follow any longer.    Past Surgical  History:  Procedure Laterality Date  . APPENDECTOMY    . BREAST EXCISIONAL BIOPSY Left 1992   benign  . EXCISION/RELEASE BURSA HIP Left 11/24/2012   Procedure: LEFT HIP BURSECTOMY AND TENDON REPAIR ;  Surgeon: Gearlean Alf, MD;  Location: [facility];  Service: Orthopedics;  Laterality: Left;  . HIP CLOSED REDUCTION Left 12/19/2012   Procedure: CLOSED MANIPULATION HIP;  Surgeon: Mauri Pole, MD;   Location: [facility];  Service: Orthopedics;  Laterality: Left;  . HIP CLOSED REDUCTION Left 12/05/2013   Procedure: CLOSED MANIPULATION HIP;  Surgeon: Augustin Schooling, MD;  Location: [facility];  Service: Orthopedics;  Laterality: Left;  . HIP CLOSED REDUCTION Left 09/22/2015   Procedure: CLOSED REDUCTION HIP;  Surgeon: Melina Schools, MD;  Location: [facility];  Service: Orthopedics;  Laterality: Left;  . JOINT REPLACEMENT     LEFT TOTAL HIP REPLACEMENT AND REVISIONS X 2  . OOPHORECTOMY     has a partial of one ovary remaingin  . PELVIC LAPAROSCOPY     ovarian cyst removal,   . REVISION TOTAL HIP ARTHROPLASTY     left  . right Achilles Tendon repair  5/13  . TOTAL KNEE ARTHROPLASTY     right  . TOTAL KNEE ARTHROPLASTY     left  . VAGINAL HYSTERECTOMY     with ovarian cyst removal        Vanessa Kick, MD 11/10/16 [phone]

## 2016-11-08 NOTE — ED Notes (Signed)
Daughter did not want any bulky dressing to laceration site on right eye. Sts they would just prefer and bandaid... Placed bacitracin as well.   Left shoulder = cleaned, non-adherent gauze, kerlix.

## 2016-11-12 ENCOUNTER — Encounter (HOSPITAL_COMMUNITY): Payer: Self-pay | Admitting: *Deleted

## 2016-11-12 ENCOUNTER — Emergency Department (HOSPITAL_COMMUNITY)
Admission: EM | Admit: 2016-11-12 | Discharge: 2016-11-12 | Disposition: A | Discharge status: Home / Self Care | Payer: Medicare Other | Attending: Emergency Medicine | Admitting: Emergency Medicine

## 2016-11-12 ENCOUNTER — Emergency Department (HOSPITAL_COMMUNITY): Payer: Medicare Other

## 2016-11-12 DIAGNOSIS — Z87891 Personal history of nicotine dependence: Secondary | ICD-10-CM | POA: Insufficient documentation

## 2016-11-12 DIAGNOSIS — I712 Thoracic aortic aneurysm, without rupture: Secondary | ICD-10-CM | POA: Diagnosis not present

## 2016-11-12 DIAGNOSIS — I129 Hypertensive chronic kidney disease with stage 1 through stage 4 chronic kidney disease, or unspecified chronic kidney disease: Secondary | ICD-10-CM | POA: Insufficient documentation

## 2016-11-12 DIAGNOSIS — E871 Hypo-osmolality and hyponatremia: Secondary | ICD-10-CM | POA: Diagnosis not present

## 2016-11-12 DIAGNOSIS — Z1389 Encounter for screening for other disorder: Secondary | ICD-10-CM | POA: Diagnosis not present

## 2016-11-12 DIAGNOSIS — N182 Chronic kidney disease, stage 2 (mild): Secondary | ICD-10-CM | POA: Diagnosis not present

## 2016-11-12 DIAGNOSIS — Z79899 Other long term (current) drug therapy: Secondary | ICD-10-CM | POA: Insufficient documentation

## 2016-11-12 DIAGNOSIS — I309 Acute pericarditis, unspecified: Secondary | ICD-10-CM | POA: Diagnosis not present

## 2016-11-12 DIAGNOSIS — I351 Nonrheumatic aortic (valve) insufficiency: Secondary | ICD-10-CM | POA: Diagnosis not present

## 2016-11-12 DIAGNOSIS — D649 Anemia, unspecified: Secondary | ICD-10-CM | POA: Diagnosis not present

## 2016-11-12 DIAGNOSIS — N183 Chronic kidney disease, stage 3 (moderate): Secondary | ICD-10-CM | POA: Diagnosis not present

## 2016-11-12 DIAGNOSIS — I10 Essential (primary) hypertension: Secondary | ICD-10-CM | POA: Diagnosis not present

## 2016-11-12 DIAGNOSIS — Z0000 Encounter for general adult medical examination without abnormal findings: Secondary | ICD-10-CM | POA: Diagnosis not present

## 2016-11-12 DIAGNOSIS — R079 Chest pain, unspecified: Secondary | ICD-10-CM | POA: Diagnosis not present

## 2016-11-12 LAB — CBC
HCT: 29.5 % — ABNORMAL LOW (ref 36.0–46.0)
Hemoglobin: 9.7 g/dL — ABNORMAL LOW (ref 12.0–15.0)
MCH: 28 pg (ref 26.0–34.0)
MCHC: 32.9 g/dL (ref 30.0–36.0)
MCV: 85.3 fL (ref 78.0–100.0)
Platelets: 255 10*3/uL (ref 150–400)
RBC: 3.46 MIL/uL — ABNORMAL LOW (ref 3.87–5.11)
RDW: 13.4 % (ref 11.5–15.5)
WBC: 9 10*3/uL (ref 4.0–10.5)

## 2016-11-12 LAB — BASIC METABOLIC PANEL
Anion gap: 8 (ref 5–15)
BUN: 24 mg/dL — ABNORMAL HIGH (ref 6–20)
CO2: 24 mmol/L (ref 22–32)
Calcium: 8.6 mg/dL — ABNORMAL LOW (ref 8.9–10.3)
Chloride: 96 mmol/L — ABNORMAL LOW (ref 101–111)
Creatinine, Ser: 0.89 mg/dL (ref 0.44–1.00)
GFR calc Af Amer: >60 mL/min (ref >60)
GFR calc non Af Amer: 58 mL/min — ABNORMAL LOW (ref >60)
Glucose, Bld: 95 mg/dL (ref 65–99)
Potassium: 4.2 mmol/L (ref 3.5–5.1)
Sodium: 128 mmol/L — ABNORMAL LOW (ref 135–145)

## 2016-11-12 LAB — I-STAT TROPONIN, ED: Troponin i, poc: 0 ng/mL (ref 0.00–0.08)

## 2016-11-12 MED ORDER — PREDNISONE 10 MG PO TABS: ORAL_TABLET | ORAL | 0 refills | Status: DC

## 2016-11-12 MED ORDER — PREDNISONE 20 MG PO TABS
40.0000 mg | ORAL_TABLET | Freq: Once | ORAL | Status: AC
  Administered 2016-11-12: 40 mg via ORAL
  Filled 2016-11-12: qty 2

## 2016-11-12 MED ORDER — IBUPROFEN 400 MG PO TABS
400.0000 mg | ORAL_TABLET | Freq: Once | ORAL | Status: AC
  Administered 2016-11-12: 400 mg via ORAL
  Filled 2016-11-12: qty 1

## 2016-11-12 MED ORDER — SODIUM CHLORIDE 0.9 % IV BOLUS (SEPSIS): 500.0000 mL | Freq: Once | INTRAVENOUS | Status: DC

## 2016-11-12 NOTE — ED Notes (Signed)
Pt departed in NAD, escorted by NT.

## 2016-11-12 NOTE — ED Provider Notes (Signed)
[facility] DEPT Provider Note   CSN: [id number] Arrival date & time: 11/12/16  1737     History   Chief Complaint Chief Complaint  Patient presents with  . Chest Pain    HPI [name] is a 83 y.o. female.  HPI Patient presents with chest pain. Dull and in her mid chest. Started earlier today. Worse with breathing and was laying down. States that this feels like her previous pericarditis. States she has had it several times in the past and this feels same. No fevers. No cough. No change in the chronic swelling in her legs. Also had a physical earlier today. Set her sodium was 126 has had some hyponatremia in the past. States she drinks some salt water before coming in here. Past Medical History:  Diagnosis Date  . Anemia    CHRONIC  . Aortic insufficiency    a. 2D echo 08/29/15: EF 55-60%, mild LVH, diastolic dysfunction, elevated LV filling pressure, mild AI, severe LAE, mild RAE, mild TR, dilated descending thoracic aorta just distal to the takeoff of the left subclavian, measuring 4.1 cm, no pericardial effusion.  . Arthritis    OA-SOME BACK AND NECK PAIN,  GOES TO CHIROPRACTOR TWICE A MONTH;  HX OF JOINT REPLACEMENTS   . Cancer ([place])    SKIN CANCERS REMOVED FROM LEGS  . CKD (chronic kidney disease), stage II   . Essential hypertension   . GERD (gastroesophageal reflux disease)   . PSVT (paroxysmal supraventricular tachycardia) ([place])   . Recurrent Pericarditis    RECURRENT   . Thoracic aneurysm    a. Followed by [facility] -last seen in 12/2013 w/ plan for f/u MRA and thoracic surgery f/u in 2 yrs, but patient does not wish to follow any longer.    Patient Active Problem List   Diagnosis Date Noted  . Anemia 09/22/2015  . h/o pericarditis   . Essential hypertension   . Aortic insufficiency   . Hip dislocation, left ([place]) 12/05/2013  . Trochanteric bursitis of left hip 11/24/2012  . Aneurysm of thoracic aorta [facility])     Past Surgical History:  Procedure  Laterality Date  . APPENDECTOMY    . BREAST EXCISIONAL BIOPSY Left 1992   benign  . EXCISION/RELEASE BURSA HIP Left 11/24/2012   Procedure: LEFT HIP BURSECTOMY AND TENDON REPAIR ;  Surgeon: Gearlean Alf, MD;  Location: [facility];  Service: Orthopedics;  Laterality: Left;  . HIP CLOSED REDUCTION Left 12/19/2012   Procedure: CLOSED MANIPULATION HIP;  Surgeon: Mauri Pole, MD;  Location: [facility];  Service: Orthopedics;  Laterality: Left;  . HIP CLOSED REDUCTION Left 12/05/2013   Procedure: CLOSED MANIPULATION HIP;  Surgeon: Augustin Schooling, MD;  Location: [facility];  Service: Orthopedics;  Laterality: Left;  . HIP CLOSED REDUCTION Left 09/22/2015   Procedure: CLOSED REDUCTION HIP;  Surgeon: Melina Schools, MD;  Location: [facility];  Service: Orthopedics;  Laterality: Left;  . JOINT REPLACEMENT     LEFT TOTAL HIP REPLACEMENT AND REVISIONS X 2  . OOPHORECTOMY     has a partial of one ovary remaingin  . PELVIC LAPAROSCOPY     ovarian cyst removal,   . REVISION TOTAL HIP ARTHROPLASTY     left  . right Achilles Tendon repair  5/13  . TOTAL KNEE ARTHROPLASTY     right  . TOTAL KNEE ARTHROPLASTY     left  . VAGINAL HYSTERECTOMY     with ovarian cyst removal    OB History  Gravida Para Term Preterm AB Living   2 2       2    SAB TAB Ectopic Multiple Live Births                   Home Medications    Prior to Admission medications   Medication Sig Start Date End Date Taking? Authorizing Provider  aspirin 81 MG tablet Take 81 mg by mouth daily.   Yes [provider]  Calcium Carbonate-Vit D-Min (CALCIUM 1200 PO) Take 1 tablet by mouth daily.    Yes [provider]  carboxymethylcellulose (REFRESH TEARS) 0.5 % SOLN Place 1 drop into both eyes daily.    Yes [provider]  carvedilol (COREG) 12.5 MG tablet Take 6.25-12.5 mg by mouth See admin instructions. 6.25 mg in the morning and 12.5 mg at night   Yes [provider]  cholecalciferol (VITAMIN D) 1000  UNITS tablet Take 1,000 Units by mouth daily.   Yes [provider]  clobetasol cream (TEMOVATE) 4.12 % Apply 1 application topically 2 (two) times daily as needed (itching).  08/08/13  Yes [provider]  diltiazem (CARDIZEM CD) 240 MG 24 hr capsule Take 1 capsule (240 mg total) by mouth every evening. 07/06/16  Yes Belva Crome, MD  estrogens, conjugated, (PREMARIN) 0.3 MG tablet Take 1 tablet (0.3 mg total) by mouth daily. 08/23/16  Yes Fontaine, Belinda Block, MD  meloxicam (MOBIC) 7.5 MG tablet Take 7.5 mg by mouth daily as needed for pain.  03/21/15  Yes [provider]  mupirocin ointment (BACTROBAN) 2 % Apply 1 application topically 2 (two) times daily. Patient taking differently: Apply 1 application topically 2 (two) times daily as needed (for cuts).  12/08/13  Yes Baker, Zachary H, PA-C  omega-3 acid ethyl esters (LOVAZA) 1 g capsule Take 2 g by mouth daily.   Yes [provider]  valsartan (DIOVAN) 160 MG tablet Take 160 mg by mouth daily as needed (Systolic BP > 878).   Yes [provider]  predniSONE (DELTASONE) 10 MG tablet 3 pills a day for 2 days, then 2 pills a day for 2 days. Then 1 pill a day for 2 days 11/12/16   Davonna Belling, MD    Family History Family History  Problem Relation Age of Onset  . Hypertension Mother   . Stroke Mother   . Heart attack Father 37    Social History Social History  Substance Use Topics  . Smoking status: Former Smoker    Types: Cigarettes  . Smokeless tobacco: Never Used     Comment: quit age 66  . Alcohol use No     Allergies   Keflex [cephalexin]; Shellfish allergy; Shellfish-derived products; Clindamycin/lincomycin; Lincomycin; Sugar-protein-starch; Tape; Ciprofloxacin; Sulfa antibiotics; and Sulfites   Review of Systems Review of Systems  Constitutional: Negative for appetite change.  HENT: Negative for congestion.   Respiratory: Positive for shortness of breath.   Cardiovascular:  Positive for chest pain and leg swelling.  Gastrointestinal: Negative for abdominal pain.  Endocrine: Negative for polyuria.  Genitourinary: Negative for dysuria.  Musculoskeletal: Negative for back pain.  Neurological: Negative for dizziness and light-headedness.  Hematological: Negative for adenopathy.  Psychiatric/Behavioral: Negative for confusion.     Physical Exam Updated Vital Signs BP (!) 162/73   Pulse 73   Temp 98.6 F (37 C) (Oral)   Resp 16   SpO2 97%   Physical Exam  Constitutional: She appears well-developed.  HENT:  Head: Normocephalic.  Neck: Neck supple.  Cardiovascular: Normal rate.  Exam reveals no friction rub.   Pulmonary/Chest: Effort normal.  Abdominal: Soft.  Musculoskeletal: She exhibits edema.  Chronic edema bilateral lower extremities.  Skin: Skin is warm. Capillary refill takes less than 2 seconds.     ED Treatments / Results  Labs (all labs ordered are listed, but only abnormal results are displayed) Labs Reviewed  BASIC METABOLIC PANEL - Abnormal; Notable for the following:       Result Value   Sodium 128 (*)    Chloride 96 (*)    BUN 24 (*)    Calcium 8.6 (*)    GFR calc non Af Amer 58 (*)    All other components within normal limits  CBC - Abnormal; Notable for the following:    RBC 3.46 (*)    Hemoglobin 9.7 (*)    HCT 29.5 (*)    All other components within normal limits  I-STAT TROPONIN, ED    EKG  EKG Interpretation  Date/Time:  Friday November 12 2016 17:38:01 EDT Ventricular Rate:  76 PR Interval:  206 QRS Duration: 90 QT Interval:  360 QTC Calculation: 405 R Axis:   46 Text Interpretation:  Normal sinus rhythm Possible Anterior infarct , age undetermined Abnormal ECG No significant change since last tracing Confirmed by Pattricia Boss [phone]) on 11/12/2016 5:52:48 PM Also confirmed by Davonna Belling [phone])  on 11/12/2016 9:55:23 PM       Radiology Dg Chest 2 View  Result Date: 11/12/2016 CLINICAL DATA:   Hyponatremia.  Left chest pain.  Ex-smoker. EXAM: CHEST  2 VIEW COMPARISON:  08/28/2015. FINDINGS: The cardiac silhouette remains borderline enlarged. The interstitial markings remain prominent. Unremarkable bones. IMPRESSION: 1. No acute abnormality. 2. Stable chronic interstitial lung disease compatible with the history of smoking. 3. Stable borderline cardiomegaly. Electronically Signed   By: Claudie Revering M.D.   On: 11/12/2016 18:53    Procedures Procedures (including critical care time)  Medications Ordered in ED Medications  sodium chloride 0.9 % bolus 500 mL (500 mLs Intravenous Refused 11/12/16 2320)  ibuprofen (ADVIL,MOTRIN) tablet 400 mg (400 mg Oral Given 11/12/16 2336)  predniSONE (DELTASONE) tablet 40 mg (40 mg Oral Given 11/12/16 2336)     Initial Impression / Assessment and Plan / ED Course  I have reviewed the triage vital signs and the nursing notes.  Pertinent labs & imaging results that were available during my care of the patient were reviewed by me and considered in my medical decision making (see chart for details).     Patient with chest pain. Likely recurrent pericarditis. EKG stable. Troponin negative. Pulmonary embolism felt less likely. Has had thoracic aortic aneurysm but she would not want either endovascular or open treatment for this. Will give single dose of ibuprofen here and started on steroids due to her renal insufficiency. Follow-up with Dr. Tamala Julian as needed. Also mild hyponatremia. Patient does not want IV fluids and rather do some oral hydration. Will also need follow-up for this.  Final Clinical Impressions(s) / ED Diagnoses   Final diagnoses:  Hyponatremia  Acute pericarditis, unspecified type    New Prescriptions Discharge Medication List as of 11/12/2016 11:26 PM    START taking these medications   Details  predniSONE (DELTASONE) 10 MG tablet 3 pills a day for 2 days, then 2 pills a day for 2 days. Then 1 pill a day for 2 days, Print           Davonna Belling, MD  11/12/16 2349  

## 2016-11-12 NOTE — ED Triage Notes (Signed)
Pt reports going for her physical this am, was called and told she had low sodium level. Today has also noticed chest pains, increases when taking a deep breath and pain increases when lying down. Has hx of pericarditis and states this feels similar.

## 2016-11-15 ENCOUNTER — Ambulatory Visit (HOSPITAL_COMMUNITY)
Admission: EM | Admit: 2016-11-15 | Discharge: 2016-11-15 | Disposition: A | Discharge status: Home / Self Care | Payer: Medicare Other

## 2016-11-15 NOTE — ED Triage Notes (Signed)
Pt here to have sutures removed from her right forehead.  Pt reports no problems.

## 2016-11-15 NOTE — ED Notes (Signed)
Removed 8 sutures from patients, right forehead.  Wound was clean, dry, and intact.

## 2016-11-22 DIAGNOSIS — E871 Hypo-osmolality and hyponatremia: Secondary | ICD-10-CM | POA: Diagnosis not present

## 2016-11-24 DIAGNOSIS — M9903 Segmental and somatic dysfunction of lumbar region: Secondary | ICD-10-CM | POA: Diagnosis not present

## 2016-11-24 DIAGNOSIS — M9902 Segmental and somatic dysfunction of thoracic region: Secondary | ICD-10-CM | POA: Diagnosis not present

## 2016-11-24 DIAGNOSIS — M9901 Segmental and somatic dysfunction of cervical region: Secondary | ICD-10-CM | POA: Diagnosis not present

## 2016-11-26 DIAGNOSIS — Z23 Encounter for immunization: Secondary | ICD-10-CM | POA: Diagnosis not present

## 2016-12-08 DIAGNOSIS — M9903 Segmental and somatic dysfunction of lumbar region: Secondary | ICD-10-CM | POA: Diagnosis not present

## 2016-12-08 DIAGNOSIS — M9901 Segmental and somatic dysfunction of cervical region: Secondary | ICD-10-CM | POA: Diagnosis not present

## 2016-12-08 DIAGNOSIS — M9902 Segmental and somatic dysfunction of thoracic region: Secondary | ICD-10-CM | POA: Diagnosis not present

## 2016-12-22 DIAGNOSIS — M9903 Segmental and somatic dysfunction of lumbar region: Secondary | ICD-10-CM | POA: Diagnosis not present

## 2016-12-22 DIAGNOSIS — M9901 Segmental and somatic dysfunction of cervical region: Secondary | ICD-10-CM | POA: Diagnosis not present

## 2016-12-22 DIAGNOSIS — M9902 Segmental and somatic dysfunction of thoracic region: Secondary | ICD-10-CM | POA: Diagnosis not present

## 2016-12-27 ENCOUNTER — Telehealth: Payer: Self-pay | Admitting: Interventional Cardiology

## 2016-12-27 NOTE — Telephone Encounter (Signed)
Pt states that she recently moved and has had increased stress d/t not liking where she lives.  On 10/13, pt woke up feeling bad and BP was 80/43.  Pt laid down and rested abd it came back up.  Checked again at 5pm and it was 173/75 so she took a Diovan and it came down to 143/64 at 7:25P.  BP's on 10/14 were 184/72, 143/64, 173/75 and 144/64.  She took Diovan each time it was up and it brought it back down.  BP this morning at 9:45A was 178/76 so she took a Diovan.  Rechecked while on the phone and it was 152/68, HR 64.  Pt states only issue when BP is up is some tension in her neck.  Advised I would send message to Dr. Tamala Julian for review and advisement.  Pt appreciative for call.

## 2016-12-27 NOTE — Telephone Encounter (Signed)
New Message  Pt c/o BP issue: STAT if pt c/o blurred vision, one-sided weakness or slurred speech  1. What are your last 5 BP readings? Sat am 80/43, pm 173/75 took bp med 7:25pm 143/64, 180/75, 176/75, 10/14 7am 184/72,  2:30173/75 6pm 144/65, 166/70, 10/15 am 178/76  2. Are you having any other symptoms (ex. Dizziness, headache, blurred vision, passed out)? Per pt felt like she was going to faint when her bp was 80/43.  3. What is your BP issue? Per pt would like to discuss with RN. Please call back to discuss

## 2016-12-28 NOTE — Telephone Encounter (Signed)
Take diovan daily. Get BMET in 7-10 days.

## 2016-12-29 MED ORDER — VALSARTAN 160 MG PO TABS: 160.0000 mg | ORAL_TABLET | Freq: Every day | ORAL | 1 refills | Status: DC

## 2016-12-29 NOTE — Telephone Encounter (Signed)
Spoke with pt and made her aware of Dr. Thompson Caul recommendations.  Pt has appt 10/23 and we will do labs at that time.  Pt verbalized understanding and was appreciative for call.

## 2016-12-30 ENCOUNTER — Telehealth: Payer: Self-pay | Admitting: Interventional Cardiology

## 2016-12-30 NOTE — Telephone Encounter (Signed)
Pt states BP has been elevated again today.  Pt was instructed to start taking Diovan 160mg  QD yesterday.  BP's from earlier today were 171/69, 188/79 and 192/71.  Pt took Carvedilol 6.25mg  and Diovan 160mg  this AM.  Pt checked BP about 4:20pm and it was down to 161/72, HR 75.  Pt denies any sx of HTN.  Pt has appt with Dr. Tamala Julian on 10/23.  Advised pt to continue medications as instructed and monitor BP once a day, 2 hrs after medications but try not to check multiple times a day. Advised I will send message to Dr. Tamala Julian to see if he has further recommendations.  Pt appreciative for call.

## 2016-12-30 NOTE — Telephone Encounter (Signed)
Left message to call back  

## 2016-12-30 NOTE — Telephone Encounter (Signed)
[name] is calling because her blood pressure is kind of staying up , it was 171/69, 188/79 and then 192/71 even with her taking the Diovan it is still running high .  Please call

## 2016-12-31 NOTE — Telephone Encounter (Signed)
Follow up    Patient calling back regarding Diovan.  Patient states she needs need prescription for Diovan, expired 02/02/2016, not sure if medication is still "good". Please call  Pt c/o medication issue:  1. Name of Medication: Diovan  2. How are you currently taking this medication (dosage and times per day)? As prescribed.  3. Are you having a reaction (difficulty breathing--STAT)? NO  4. What is your medication issue?  Patient states Diovan is out of date, questioning the potency

## 2016-12-31 NOTE — Telephone Encounter (Signed)
Spoke with pt and advised her that new script was sent into [facility] on [address] a few days ago.  Pt will contact them to fill.  Pt appreciative for call.

## 2017-01-04 ENCOUNTER — Ambulatory Visit (INDEPENDENT_AMBULATORY_CARE_PROVIDER_SITE_OTHER): Payer: Medicare Other | Admitting: Interventional Cardiology

## 2017-01-04 ENCOUNTER — Encounter (INDEPENDENT_AMBULATORY_CARE_PROVIDER_SITE_OTHER): Payer: Self-pay

## 2017-01-04 ENCOUNTER — Encounter: Payer: Self-pay | Admitting: Interventional Cardiology

## 2017-01-04 VITALS — BP 154/64 | HR 70 | Ht 64.0 in | Wt 145.4 lb

## 2017-01-04 DIAGNOSIS — I351 Nonrheumatic aortic (valve) insufficiency: Secondary | ICD-10-CM

## 2017-01-04 DIAGNOSIS — I10 Essential (primary) hypertension: Secondary | ICD-10-CM | POA: Diagnosis not present

## 2017-01-04 DIAGNOSIS — I308 Other forms of acute pericarditis: Secondary | ICD-10-CM | POA: Diagnosis not present

## 2017-01-04 DIAGNOSIS — I712 Thoracic aortic aneurysm, without rupture, unspecified: Secondary | ICD-10-CM

## 2017-01-04 NOTE — Progress Notes (Signed)
Cardiology Office Note    Date:  01/04/2017   ID:  Shiori, Adcox [birth year], MRN [id number]  PCP:  Seward Carol, MD  Cardiologist: Sinclair Grooms, MD   Chief Complaint  Patient presents with  . Follow-up    Hypertension  . Cardiac Valve Problem    History of Present Illness:  [name] is a 83 y.o. female who presents for ascending aortic aneurysm (4.6 cm in diameter), hypertension, mild aortic regurgitation, and history of paroxysmal SVT  She and Mr. Faciane have moved to [place]. She is not adjusted to be an out of her on home environment. She denies chest pain. No orthopnea or PND. She has been concerned about transient/sporadic blood pressure elevations. She denies any discomfort, dyspnea, swelling, orthopnea, M.D.   Past Medical History:  Diagnosis Date  . Anemia    CHRONIC  . Aortic insufficiency    a. 2D echo 08/29/15: EF 55-60%, mild LVH, diastolic dysfunction, elevated LV filling pressure, mild AI, severe LAE, mild RAE, mild TR, dilated descending thoracic aorta just distal to the takeoff of the left subclavian, measuring 4.1 cm, no pericardial effusion.  . Arthritis    OA-SOME BACK AND NECK PAIN,  GOES TO CHIROPRACTOR TWICE A MONTH;  HX OF JOINT REPLACEMENTS   . Cancer ([place])    SKIN CANCERS REMOVED FROM LEGS  . CKD (chronic kidney disease), stage II   . Essential hypertension   . GERD (gastroesophageal reflux disease)   . PSVT (paroxysmal supraventricular tachycardia) ([place])   . Recurrent Pericarditis    RECURRENT   . Thoracic aneurysm    a. Followed by [facility] -last seen in 12/2013 w/ plan for f/u MRA and thoracic surgery f/u in 2 yrs, but patient does not wish to follow any longer.    Past Surgical History:  Procedure Laterality Date  . APPENDECTOMY    . BREAST EXCISIONAL BIOPSY Left 1992   benign  . EXCISION/RELEASE BURSA HIP Left 11/24/2012   Procedure: LEFT HIP BURSECTOMY AND TENDON REPAIR ;  Surgeon: Gearlean Alf, MD;   [facility];  Service: Orthopedics;  Laterality: Left;  . HIP CLOSED REDUCTION Left 12/19/2012   Procedure: CLOSED MANIPULATION HIP;  Surgeon: Mauri Pole, MD;  Location: [facility];  Service: Orthopedics;  Laterality: Left;  . HIP CLOSED REDUCTION Left 12/05/2013   Procedure: CLOSED MANIPULATION HIP;  Surgeon: Augustin Schooling, MD;  Location[facility];  Service: Orthopedics;  Laterality: Left;  . HIP CLOSED REDUCTION Left 09/22/2015   Procedure: CLOSED REDUCTION HIP;  Surgeon: Melina Schools, MD;  Location: [facility];  Service: Orthopedics;  Laterality: Left;  . JOINT REPLACEMENT     LEFT TOTAL HIP REPLACEMENT AND REVISIONS X 2  . OOPHORECTOMY     has a partial of one ovary remaingin  . PELVIC LAPAROSCOPY     ovarian cyst removal,   . REVISION TOTAL HIP ARTHROPLASTY     left  . right Achilles Tendon repair  5/13  . TOTAL KNEE ARTHROPLASTY     right  . TOTAL KNEE ARTHROPLASTY     left  . VAGINAL HYSTERECTOMY     with ovarian cyst removal    Current Medications: Outpatient Medications Prior to Visit  Medication Sig Dispense Refill  . aspirin 81 MG tablet Take 81 mg by mouth daily.    . Calcium Carbonate-Vit D-Min (CALCIUM 1200 PO) Take 1 tablet by mouth daily.     . carboxymethylcellulose (REFRESH TEARS) 0.5 %  SOLN Place 1 drop into both eyes daily.     . carvedilol (COREG) 12.5 MG tablet Take 6.25-12.5 mg by mouth See admin instructions. 6.25 mg in the morning and 12.5 mg at night    . cholecalciferol (VITAMIN D) 1000 UNITS tablet Take 1,000 Units by mouth daily.    . clobetasol cream (TEMOVATE) 4.12 % Apply 1 application topically 2 (two) times daily as needed (itching).     Marland Kitchen diltiazem (CARDIZEM CD) 240 MG 24 hr capsule Take 1 capsule (240 mg total) by mouth every evening. 90 capsule 3  . estrogens, conjugated, (PREMARIN) 0.3 MG tablet Take 1 tablet (0.3 mg total) by mouth daily. 90 tablet 4  . meloxicam (MOBIC) 7.5 MG tablet Take 7.5 mg by mouth daily as needed for pain.     .  mupirocin ointment (BACTROBAN) 2 % Apply 1 application topically 2 (two) times daily. (Patient taking differently: Apply 1 application topically 2 (two) times daily as needed (for cuts). ) 30 g 0  . omega-3 acid ethyl esters (LOVAZA) 1 g capsule Take 2 g by mouth daily.    . valsartan (DIOVAN) 160 MG tablet Take 1 tablet (160 mg total) by mouth daily. 90 tablet 1  . predniSONE (DELTASONE) 10 MG tablet 3 pills a day for 2 days, then 2 pills a day for 2 days. Then 1 pill a day for 2 days (Patient not taking: Reported on 01/04/2017) 12 tablet 0   No facility-administered medications prior to visit.      Allergies:   Keflex [cephalexin]; Shellfish allergy; Shellfish-derived products; Clindamycin/lincomycin; Lincomycin; Sugar-protein-starch; Tape; Ciprofloxacin; Sulfa antibiotics; and Sulfites   Social History   Social History  . Marital status: Married    Spouse name: N/A  . Number of children: N/A  . Years of education: N/A   Social History Main Topics  . Smoking status: Former Smoker    Types: Cigarettes  . Smokeless tobacco: Never Used     Comment: quit age 23  . Alcohol use No  . Drug use: No  . Sexual activity: Not Currently    Birth control/ protection: Surgical     Comment: HYST-1st intercourse 83 yo-Fewer than 5 partners   Other Topics Concern  . None   Social History Narrative  . None     Family History:  The patient's family history includes Heart attack (age of onset: 56) in her father; Hypertension in her mother; Stroke in her mother.   ROS:   Please see the history of present illness.    Some anxiety and depression related to taking care of all of her husband's needs. He has severe rheumatoid arthritis and had been falling frequently.  All other systems reviewed and are negative.   PHYSICAL EXAM:   VS:  BP (!) 154/64   Pulse 70   Ht 5\' 4"  (1.626 m)   Wt 145 lb 6.4 oz (66 kg)   BMI 24.96 kg/m    GEN: Well nourished, well developed, in no acute distress    HEENT: normal  Neck: no JVD, carotid bruits, or masses Cardiac: RRR; 2/6 decrescendo diastolic aortic regurgitation murmur. No rubs, or gallops,no edema  Respiratory:  clear to auscultation bilaterally, normal work of breathing GI: soft, nontender, nondistended, + BS MS: no deformity or atrophy  Skin: warm and dry, no rash Neuro:  Alert and Oriented x 3, Strength and sensation are intact Psych: euthymic mood, full affect  Wt Readings from Last 3 Encounters:  01/04/17 145 lb  6.4 oz (66 kg)  08/23/16 153 lb (69.4 kg)  04/09/16 151 lb 6.4 oz (68.7 kg)      Studies/Labs Reviewed:   EKG:  EKG  No new data  Recent Labs: 11/12/2016: BUN 24; Creatinine, Ser 0.89; Hemoglobin 9.7; Platelets 255; Potassium 4.2; Sodium 128   Lipid Panel No results found for: CHOL, TRIG, HDL, CHOLHDL, VLDL, LDLCALC, LDLDIRECT  Additional studies/ records that were reviewed today include:  No new data    ASSESSMENT:    1. Essential hypertension   2. Nonrheumatic aortic valve insufficiency   3. Thoracic aortic aneurysm without rupture (HCC)   4. Other acute pericarditis      PLAN:  In order of problems listed above:  1. Target is set at 150/90 mmHg or less. Systolic pressure will be higher because of mild to moderate aortic regurgitation. She will continue to monitor. 2. Aortic regurgitation related to dilated thoracic aorta. No significant change in severity based on exam. No heart failure. Plan clinical follow-up in 9-12 months. May consider repeating an echocardiogram at that time. 3. Thoracic aortic aneurysm without rupture. The patient was being followed by CT surgery but has decided against yearly follow-ups.he was seen Dr. Cyndia Bent. 4. No recent recurrent episodes.    Medication Adjustments/Labs and Tests Ordered: Current medicines are reviewed at length with the patient today.  Concerns regarding medicines are outlined above.  Medication changes, Labs and Tests ordered today are listed in  the Patient Instructions below. Patient Instructions  Medication Instructions:  Your physician recommends that you continue on your current medications as directed. Please refer to the Current Medication list given to you today.  Labwork: None  Testing/Procedures: None  Follow-Up: Your physician wants you to follow-up in: 1 year with Dr. Tamala Julian.  You will receive a reminder letter in the mail two months in advance. If you don't receive a letter, please call our office to schedule the follow-up appointment.   Any Other Special Instructions Will Be Listed Below (If Applicable).     If you need a refill on your cardiac medications before your next appointment, please call your pharmacy.      Signed, Sinclair Grooms, MD  01/04/2017 4:25 PM    [facility] [address], [place], [place]  [address] Phone: [phone]; Fax: [phone]

## 2017-01-04 NOTE — Patient Instructions (Signed)

## 2017-01-10 DIAGNOSIS — M9901 Segmental and somatic dysfunction of cervical region: Secondary | ICD-10-CM | POA: Diagnosis not present

## 2017-01-10 DIAGNOSIS — M9902 Segmental and somatic dysfunction of thoracic region: Secondary | ICD-10-CM | POA: Diagnosis not present

## 2017-01-10 DIAGNOSIS — M9903 Segmental and somatic dysfunction of lumbar region: Secondary | ICD-10-CM | POA: Diagnosis not present

## 2017-01-17 DIAGNOSIS — L821 Other seborrheic keratosis: Secondary | ICD-10-CM | POA: Diagnosis not present

## 2017-01-17 DIAGNOSIS — Z85828 Personal history of other malignant neoplasm of skin: Secondary | ICD-10-CM | POA: Diagnosis not present

## 2017-01-17 DIAGNOSIS — L570 Actinic keratosis: Secondary | ICD-10-CM | POA: Diagnosis not present

## 2017-01-17 DIAGNOSIS — D692 Other nonthrombocytopenic purpura: Secondary | ICD-10-CM | POA: Diagnosis not present

## 2017-01-22 ENCOUNTER — Emergency Department (HOSPITAL_COMMUNITY): Payer: Medicare Other

## 2017-01-22 ENCOUNTER — Emergency Department (HOSPITAL_COMMUNITY)
Admission: EM | Admit: 2017-01-22 | Discharge: 2017-01-22 | Disposition: A | Discharge status: Home / Self Care | Payer: Medicare Other | Attending: Emergency Medicine | Admitting: Emergency Medicine

## 2017-01-22 ENCOUNTER — Encounter (HOSPITAL_COMMUNITY): Payer: Self-pay | Admitting: Internal Medicine

## 2017-01-22 DIAGNOSIS — Z96653 Presence of artificial knee joint, bilateral: Secondary | ICD-10-CM | POA: Diagnosis not present

## 2017-01-22 DIAGNOSIS — X58XXXA Exposure to other specified factors, initial encounter: Secondary | ICD-10-CM | POA: Diagnosis not present

## 2017-01-22 DIAGNOSIS — Z96643 Presence of artificial hip joint, bilateral: Secondary | ICD-10-CM | POA: Insufficient documentation

## 2017-01-22 DIAGNOSIS — Z79899 Other long term (current) drug therapy: Secondary | ICD-10-CM | POA: Diagnosis not present

## 2017-01-22 DIAGNOSIS — Y999 Unspecified external cause status: Secondary | ICD-10-CM | POA: Diagnosis not present

## 2017-01-22 DIAGNOSIS — N182 Chronic kidney disease, stage 2 (mild): Secondary | ICD-10-CM | POA: Diagnosis not present

## 2017-01-22 DIAGNOSIS — S73005A Unspecified dislocation of left hip, initial encounter: Secondary | ICD-10-CM

## 2017-01-22 DIAGNOSIS — I129 Hypertensive chronic kidney disease with stage 1 through stage 4 chronic kidney disease, or unspecified chronic kidney disease: Secondary | ICD-10-CM | POA: Diagnosis not present

## 2017-01-22 DIAGNOSIS — Y939 Activity, unspecified: Secondary | ICD-10-CM | POA: Diagnosis not present

## 2017-01-22 DIAGNOSIS — S73002A Unspecified subluxation of left hip, initial encounter: Secondary | ICD-10-CM | POA: Diagnosis not present

## 2017-01-22 DIAGNOSIS — Y929 Unspecified place or not applicable: Secondary | ICD-10-CM | POA: Diagnosis not present

## 2017-01-22 DIAGNOSIS — Z87891 Personal history of nicotine dependence: Secondary | ICD-10-CM | POA: Insufficient documentation

## 2017-01-22 DIAGNOSIS — M25552 Pain in left hip: Secondary | ICD-10-CM | POA: Diagnosis not present

## 2017-01-22 DIAGNOSIS — T84021A Dislocation of internal left hip prosthesis, initial encounter: Secondary | ICD-10-CM | POA: Diagnosis not present

## 2017-01-22 DIAGNOSIS — R52 Pain, unspecified: Secondary | ICD-10-CM | POA: Diagnosis not present

## 2017-01-22 MED ORDER — PROPOFOL 10 MG/ML IV BOLUS
INTRAVENOUS | Status: AC | PRN
  Administered 2017-01-22: 30 mg via INTRAVENOUS
  Administered 2017-01-22 (×3): 15 mg via INTRAVENOUS

## 2017-01-22 MED ORDER — HYDROCODONE-ACETAMINOPHEN 5-325 MG PO TABS: 1.0000 | ORAL_TABLET | Freq: Four times a day (QID) | ORAL | 0 refills | Status: DC | PRN

## 2017-01-22 MED ORDER — PROPOFOL 10 MG/ML IV BOLUS
33.0000 mg | Freq: Once | INTRAVENOUS | Status: AC
  Administered 2017-01-22: 33 mg via INTRAVENOUS
  Filled 2017-01-22: qty 20

## 2017-01-22 MED ORDER — MORPHINE SULFATE (PF) 4 MG/ML IV SOLN
4.0000 mg | Freq: Once | INTRAVENOUS | Status: AC
  Administered 2017-01-22: 4 mg via INTRAVENOUS
  Filled 2017-01-22: qty 1

## 2017-01-22 NOTE — ED Provider Notes (Signed)
[facility] DEPT Provider Note   CSN: [id number] Arrival date & time: 01/22/17  1016     History   Chief Complaint Chief Complaint  Patient presents with  . Hip Injury    HPI [name] is a 83 y.o. female.  HPI Patient is an 83 year old female with a history of prosthetic left hip who presents the emergency department with acute onset left hip pain after sitting down.  She feels as though her left hip is consistent with her prior prosthetic hip dislocations.  Pain is moderate to severe in severity and worse with eft hip.  Presents with obvious deformity to her left hip.  It   Past Medical History:  Diagnosis Date  . Anemia    CHRONIC  . Aortic insufficiency    a. 2D echo 08/29/15: EF 55-60%, mild LVH, diastolic dysfunction, elevated LV filling pressure, mild AI, severe LAE, mild RAE, mild TR, dilated descending thoracic aorta just distal to the takeoff of the left subclavian, measuring 4.1 cm, no pericardial effusion.  . Arthritis    OA-SOME BACK AND NECK PAIN,  GOES TO CHIROPRACTOR TWICE A MONTH;  HX OF JOINT REPLACEMENTS   . Cancer ([place])    SKIN CANCERS REMOVED FROM LEGS  . CKD (chronic kidney disease), stage II   . Essential hypertension   . GERD (gastroesophageal reflux disease)   . PSVT (paroxysmal supraventricular tachycardia) ([place])   . Recurrent Pericarditis    RECURRENT   . Thoracic aneurysm    a. Followed by [facility] -last seen in 12/2013 w/ plan for f/u MRA and thoracic surgery f/u in 2 yrs, but patient does not wish to follow any longer.    Patient Active Problem List   Diagnosis Date Noted  . Anemia 09/22/2015  . h/o pericarditis   . Essential hypertension   . Aortic insufficiency   . Hip dislocation, left ([place]) 12/05/2013  . Trochanteric bursitis of left hip 11/24/2012  . Aneurysm of thoracic aorta [facility])     Past Surgical History:  Procedure Laterality Date  . APPENDECTOMY    . BREAST EXCISIONAL BIOPSY Left 1992     benign  . JOINT REPLACEMENT     LEFT TOTAL HIP REPLACEMENT AND REVISIONS X 2  . OOPHORECTOMY     has a partial of one ovary remaingin  . PELVIC LAPAROSCOPY     ovarian cyst removal,   . REVISION TOTAL HIP ARTHROPLASTY     left  . right Achilles Tendon repair  5/13  . TOTAL KNEE ARTHROPLASTY     right  . TOTAL KNEE ARTHROPLASTY     left  . VAGINAL HYSTERECTOMY     with ovarian cyst removal    OB History    Gravida Para Term Preterm AB Living   2 2       2    SAB TAB Ectopic Multiple Live Births                   Home Medications    Prior to Admission medications   Medication Sig Start Date End Date Taking? Authorizing Provider  aspirin 81 MG tablet Take 81 mg by mouth daily.   Yes [provider]  Calcium Carbonate-Vit D-Min (CALCIUM 1200 PO) Take 1 tablet by mouth daily.    Yes [provider]  diltiazem (CARDIZEM CD) 240 MG 24 hr capsule Take 1 capsule (240 mg total) by mouth every evening. 07/06/16  Yes Belva Crome, MD  estrogens,  conjugated, (PREMARIN) 0.3 MG tablet Take 1 tablet (0.3 mg total) by mouth daily. 08/23/16  Yes Fontaine, Belinda Block, MD  mupirocin ointment (BACTROBAN) 2 % Apply 1 application topically 2 (two) times daily. Patient taking differently: Apply 1 application topically 2 (two) times daily as needed (for cuts).  12/08/13  Yes Baker, Zachary H, PA-C  valsartan (DIOVAN) 160 MG tablet Take 1 tablet (160 mg total) by mouth daily. Patient taking differently: Take 160 mg daily as needed by mouth (Systolic BP >557).  12/29/16  Yes Belva Crome, MD  carboxymethylcellulose (REFRESH TEARS) 0.5 % SOLN Place 1 drop into both eyes daily.     [provider]  carvedilol (COREG) 12.5 MG tablet Take 6.25-12.5 mg by mouth See admin instructions. 6.25 mg in the morning and 12.5 mg at night    [provider]  cholecalciferol (VITAMIN D) 1000 UNITS tablet Take 1,000 Units by mouth daily.    [provider]  clobetasol  cream (TEMOVATE) 3.22 % Apply 1 application topically 2 (two) times daily as needed (itching).  08/08/13   [provider]  HYDROcodone-acetaminophen (NORCO/VICODIN) 5-325 MG tablet Take 1 tablet every 6 (six) hours as needed by mouth for moderate pain. 01/22/17   Jola Schmidt, MD  meloxicam (MOBIC) 7.5 MG tablet Take 7.5 mg by mouth daily as needed for pain.  03/21/15   [provider]  omega-3 acid ethyl esters (LOVAZA) 1 g capsule Take 2 g by mouth daily.    [provider]    Family History Family History  Problem Relation Age of Onset  . Hypertension Mother   . Stroke Mother   . Heart attack Father 4    Social History Social History   Tobacco Use  . Smoking status: Former Smoker    Types: Cigarettes  . Smokeless tobacco: Never Used  . Tobacco comment: quit age 18  Substance Use Topics  . Alcohol use: No    Alcohol/week: 0.0 oz  . Drug use: No     Allergies   Keflex [cephalexin]; Shellfish allergy; Shellfish-derived products; Clindamycin/lincomycin; Lincomycin; Sugar-protein-starch; Tape; Ciprofloxacin; Sulfa antibiotics; and Sulfites   Review of Systems Review of Systems  All other systems reviewed and are negative.    Physical Exam Updated Vital Signs BP (!) 150/64   Pulse (!) 53   Resp 15   Ht 5\' 5"  (1.651 m)   Wt 67.1 kg (148 lb)   SpO2 99%   BMI 24.63 kg/m   Physical Exam  Constitutional: She is oriented to person, place, and time. She appears well-developed and well-nourished.  HENT:  Head: Normocephalic.  Eyes: EOM are normal.  Neck: Normal range of motion.  Pulmonary/Chest: Effort normal.  Abdominal: She exhibits no distension.  Musculoskeletal:  Obvious deformity of left hip consistent with left hip dislocation given shortening and internal rotation of the left lower extremity.  Normal pulses in the left foot.  Neurological: She is alert and oriented to person, place, and time.  Psychiatric: She has a normal mood and  affect.  Nursing note and vitals reviewed.    ED Treatments / Results  Labs (all labs ordered are listed, but only abnormal results are displayed) Labs Reviewed - No data to display  EKG  EKG Interpretation None       Radiology Dg Hip Unilat W Or Wo Pelvis 2-3 Views Left  Result Date: 01/22/2017 CLINICAL DATA:  Postreduction.  Post reduction left hip dislocation EXAM: DG HIP (WITH OR WITHOUT PELVIS) 2-3V  LEFT COMPARISON:  12/22/2016 FINDINGS: Interval reduction of LEFT hip prosthetic dislocation. No fracture or dislocation. IMPRESSION: Successful reduction of LEFT hip prosthetic dislocation. Electronically Signed   By: Suzy Bouchard M.D.   On: 01/22/2017 13:04   Dg Hip Unilat W Or Wo Pelvis 2-3 Views Left  Result Date: 01/22/2017 CLINICAL DATA:  Pain in left hip after bending EXAM: DG HIP (WITH OR WITHOUT PELVIS) 2-3V LEFT COMPARISON:  September 19, 2015 FINDINGS: Frontal pelvis as well as frontal and lateral left hip images were obtained. There is a total hip replaced on the left. There is dislocation at the total hip prosthesis with the femoral component displaced superiorly, laterally, and posteriorly to the acetabular component. The acetabular component appears in anatomic alignment. No acute fracture. The right hip joint shows mild narrowing without fracture or dislocation. Sacroiliac joints appear unremarkable. IMPRESSION: Dislocation at total-hip replacement site on the left with the femoral component displaced superiorly, laterally, and posterior to the acetabular component. No fracture evident. Electronically Signed   By: Lowella Grip III M.D.   On: 01/22/2017 11:33    Procedures .Sedation Performed by: Jola Schmidt, MD Authorized by: Jola Schmidt, MD   Consent:    Consent obtained:  Verbal   Consent given by:  Patient Universal protocol:    Immediately prior to procedure a time out was called: yes   Indications:    Procedure performed:  Dislocation reduction    Procedure necessitating sedation performed by:  Physician performing sedation   Intended level of sedation:  Deep Pre-sedation assessment:    Time since last food or drink:  4   NPO status caution: urgency dictates proceeding with non-ideal NPO status     ASA classification: class 3 - patient with severe systemic disease     Mallampati score:  II - soft palate, uvula, fauces visible   Pre-sedation assessments completed and reviewed: airway patency, cardiovascular function, mental status, nausea/vomiting and respiratory function   Immediate pre-procedure details:    Reassessment: Patient reassessed immediately prior to procedure     Reviewed: vital signs     Verified: bag valve mask available, emergency equipment available, intubation equipment available, IV patency confirmed and oxygen available   Procedure details (see MAR for exact dosages):    Preoxygenation:  Nasal cannula   Sedation:  Propofol   Intra-procedure monitoring:  Blood pressure monitoring, cardiac monitor, continuous pulse oximetry, frequent LOC assessments and frequent vital sign checks   Intra-procedure events: none     Total Provider sedation time (minutes):  15 Post-procedure details:    Attendance: Constant attendance by certified staff until patient recovered     Recovery: Patient returned to pre-procedure baseline     Post-sedation assessments completed and reviewed: airway patency, mental status, nausea/vomiting and respiratory function     Patient is stable for discharge or admission: yes     Patient tolerance:  Tolerated well, no immediate complications      Reduction of dislocation Performed by: Hoy Morn Consent: Verbal consent obtained. Risks and benefits: risks, benefits and alternatives were discussed Consent given by: patient Required items: required blood products, implants, devices, and special equipment available Time out: Immediately prior to procedure a "time out" was called to verify the  correct patient, procedure, equipment, support staff and site/side marked as required. Patient sedated: YES Vitals: Vital signs were monitored during sedation. Patient tolerance: Patient tolerated the procedure well with no immediate complications. Joint: left hip Reduction technique: manipulation     SPLINT APPLICATION Authorized  by: Lacye Mccarn M Consent: Verbal consent obtained. Risks and benefits: risks, benefits and alternatives were discussed Consent given by: patient Splint applied by: orthopedic technician Location details: left lower leg Splint type: Knee immobilizer Supplies used: Knee immobilizer Post-procedure: The splinted body part was neurovascularly unchanged following the procedure. Patient tolerance: Patient tolerated the procedure well with no immediate complications.       Medications Ordered in ED Medications  morphine 4 MG/ML injection 4 mg (4 mg Intravenous Given 01/22/17 1141)  propofol (DIPRIVAN) 10 mg/mL bolus/IV push 33 mg (33 mg Intravenous Given by Other 01/22/17 1222)  propofol (DIPRIVAN) 10 mg/mL bolus/IV push (15 mg Intravenous Given 01/22/17 1226)     Initial Impression / Assessment and Plan / ED Course  I have reviewed the triage vital signs and the nursing notes.  Pertinent labs & imaging results that were available during my care of the patient were reviewed by me and considered in my medical decision making (see chart for details).     Patient tolerated the procedure well.  Successful reduction of left prosthetic hip dislocation.  Outpatient orthopedic follow-up.  Home with knee immobilizer and a short course of pain medication.  Patient understands return the emergency department for new or worsening symptoms  Final Clinical Impressions(s) / ED Diagnoses   Final diagnoses:  Closed dislocation of left hip, initial encounter [facility])    ED Discharge Orders        Ordered    HYDROcodone-acetaminophen (NORCO/VICODIN) 5-325 MG tablet   Every 6 hours PRN     01/22/17 1317       Jola Schmidt, MD 01/22/17 1321

## 2017-01-22 NOTE — ED Triage Notes (Signed)
Pt arrived via [facility] from [facility]. Pt was sitting in chair in living room to tie her shoes. She felt her left hip pop and dislocate. Pt has localized pain in the left hip. Pt is comfortably currently with pillow under left knee. Pt given 164mcg fentanyl.

## 2017-01-22 NOTE — ED Notes (Signed)
Bed: QD64 Expected date: 01/22/17 Expected time: 10:11 AM Means of arrival:  Comments: Hip Dislocation

## 2017-01-26 DIAGNOSIS — M9902 Segmental and somatic dysfunction of thoracic region: Secondary | ICD-10-CM | POA: Diagnosis not present

## 2017-01-26 DIAGNOSIS — M9903 Segmental and somatic dysfunction of lumbar region: Secondary | ICD-10-CM | POA: Diagnosis not present

## 2017-01-26 DIAGNOSIS — M9901 Segmental and somatic dysfunction of cervical region: Secondary | ICD-10-CM | POA: Diagnosis not present

## 2017-01-28 DIAGNOSIS — T84021D Dislocation of internal left hip prosthesis, subsequent encounter: Secondary | ICD-10-CM | POA: Diagnosis not present

## 2017-02-08 DIAGNOSIS — M9902 Segmental and somatic dysfunction of thoracic region: Secondary | ICD-10-CM | POA: Diagnosis not present

## 2017-02-08 DIAGNOSIS — M9901 Segmental and somatic dysfunction of cervical region: Secondary | ICD-10-CM | POA: Diagnosis not present

## 2017-02-08 DIAGNOSIS — M9903 Segmental and somatic dysfunction of lumbar region: Secondary | ICD-10-CM | POA: Diagnosis not present

## 2017-02-14 ENCOUNTER — Other Ambulatory Visit: Payer: Self-pay

## 2017-02-14 MED ORDER — DILTIAZEM HCL ER COATED BEADS 240 MG PO CP24: 240.0000 mg | ORAL_CAPSULE | Freq: Every evening | ORAL | 3 refills | Status: DC

## 2017-02-25 ENCOUNTER — Ambulatory Visit (INDEPENDENT_AMBULATORY_CARE_PROVIDER_SITE_OTHER): Payer: Medicare Other | Admitting: Podiatry

## 2017-02-25 ENCOUNTER — Encounter: Payer: Self-pay | Admitting: Podiatry

## 2017-02-25 DIAGNOSIS — Q828 Other specified congenital malformations of skin: Secondary | ICD-10-CM | POA: Diagnosis not present

## 2017-02-25 DIAGNOSIS — M79674 Pain in right toe(s): Secondary | ICD-10-CM | POA: Diagnosis not present

## 2017-02-25 DIAGNOSIS — B351 Tinea unguium: Secondary | ICD-10-CM

## 2017-02-25 DIAGNOSIS — M79675 Pain in left toe(s): Secondary | ICD-10-CM | POA: Diagnosis not present

## 2017-02-25 DIAGNOSIS — M2031 Hallux varus (acquired), right foot: Secondary | ICD-10-CM

## 2017-02-27 ENCOUNTER — Encounter: Payer: Self-pay | Admitting: Podiatry

## 2017-02-27 NOTE — Progress Notes (Signed)
This patient presents the office with chief complaint of long thick painful nails.  Nails are painful walking and wearing her shoes.  She is also concerned about a painful callus on the bottom of her right big toe.  She says she had surgery years ago and developed a hallux valgus.  She then had surgery performed by an orthopedic doctor in [place] for the correction.  Following his surgery. There is still a dorsiflex position of the right hallux with a painful callus under the right hallux.  She says this is extremely painful walking and wearing her shoes.  She presents the office today for an evaluation and treatment of her painful nails and callus.  General Appearance  Alert, conversant and in no acute stress.  Vascular  Dorsalis pedis and posterior pulses are palpable  bilaterally.  Capillary return is within normal limits  Bilaterally. Temperature is within normal limits  Bilaterally  Neurologic  Senn-Weinstein monofilament wire test within normal limits  bilaterally. Muscle power  Within normal limits bilaterally.  Nails Thick disfigured discolored nails with subungual debride bilaterally from hallux to fifth toes bilaterally. No evidence of bacterial infection or drainage bilaterally.  Orthopedic  No limitations of motion of motion feet bilaterally.  No crepitus or effusions noted.  No bony pathology or digital deformities noted. Mild hallux varus right hallux with fused IPJ right hallux.  Hallux is in dorsiflexed position right foot.    Skin  normotropic skin with porokeratosis noted right hallux.  No signs of infections or ulcers noted.    Onychomycosis  B/L  Porokeratosis  Right hallux right.  ROV  Debride nails x 10.  Debride callus.  Padding was dispensed.  RTC prn.   Gardiner Barefoot DPM

## 2017-02-28 DIAGNOSIS — M9902 Segmental and somatic dysfunction of thoracic region: Secondary | ICD-10-CM | POA: Diagnosis not present

## 2017-02-28 DIAGNOSIS — M9901 Segmental and somatic dysfunction of cervical region: Secondary | ICD-10-CM | POA: Diagnosis not present

## 2017-02-28 DIAGNOSIS — M9903 Segmental and somatic dysfunction of lumbar region: Secondary | ICD-10-CM | POA: Diagnosis not present

## 2017-03-16 DIAGNOSIS — M9901 Segmental and somatic dysfunction of cervical region: Secondary | ICD-10-CM | POA: Diagnosis not present

## 2017-03-16 DIAGNOSIS — M9902 Segmental and somatic dysfunction of thoracic region: Secondary | ICD-10-CM | POA: Diagnosis not present

## 2017-03-16 DIAGNOSIS — M9903 Segmental and somatic dysfunction of lumbar region: Secondary | ICD-10-CM | POA: Diagnosis not present

## 2017-03-29 DIAGNOSIS — M9901 Segmental and somatic dysfunction of cervical region: Secondary | ICD-10-CM | POA: Diagnosis not present

## 2017-03-29 DIAGNOSIS — M9903 Segmental and somatic dysfunction of lumbar region: Secondary | ICD-10-CM | POA: Diagnosis not present

## 2017-03-29 DIAGNOSIS — M9902 Segmental and somatic dysfunction of thoracic region: Secondary | ICD-10-CM | POA: Diagnosis not present

## 2017-04-12 DIAGNOSIS — M9903 Segmental and somatic dysfunction of lumbar region: Secondary | ICD-10-CM | POA: Diagnosis not present

## 2017-04-12 DIAGNOSIS — M9902 Segmental and somatic dysfunction of thoracic region: Secondary | ICD-10-CM | POA: Diagnosis not present

## 2017-04-12 DIAGNOSIS — M9901 Segmental and somatic dysfunction of cervical region: Secondary | ICD-10-CM | POA: Diagnosis not present

## 2017-04-27 DIAGNOSIS — M9901 Segmental and somatic dysfunction of cervical region: Secondary | ICD-10-CM | POA: Diagnosis not present

## 2017-04-27 DIAGNOSIS — M9902 Segmental and somatic dysfunction of thoracic region: Secondary | ICD-10-CM | POA: Diagnosis not present

## 2017-04-27 DIAGNOSIS — M9903 Segmental and somatic dysfunction of lumbar region: Secondary | ICD-10-CM | POA: Diagnosis not present

## 2017-05-10 DIAGNOSIS — M9903 Segmental and somatic dysfunction of lumbar region: Secondary | ICD-10-CM | POA: Diagnosis not present

## 2017-05-10 DIAGNOSIS — M9901 Segmental and somatic dysfunction of cervical region: Secondary | ICD-10-CM | POA: Diagnosis not present

## 2017-05-10 DIAGNOSIS — M9902 Segmental and somatic dysfunction of thoracic region: Secondary | ICD-10-CM | POA: Diagnosis not present

## 2017-05-24 DIAGNOSIS — M9903 Segmental and somatic dysfunction of lumbar region: Secondary | ICD-10-CM | POA: Diagnosis not present

## 2017-05-24 DIAGNOSIS — M9901 Segmental and somatic dysfunction of cervical region: Secondary | ICD-10-CM | POA: Diagnosis not present

## 2017-05-24 DIAGNOSIS — M9902 Segmental and somatic dysfunction of thoracic region: Secondary | ICD-10-CM | POA: Diagnosis not present

## 2017-05-28 IMAGING — DX DG HIP (WITH OR WITHOUT PELVIS) 2-3V*L*
2 series · 2 of 2 positions shown · non-contrast
Comparison: 09/22/2015

CLINICAL DATA: Post closed hip reduction.

EXAM:
DG HIP (WITH OR WITHOUT PELVIS) 2-3V LEFT

[hip ap]
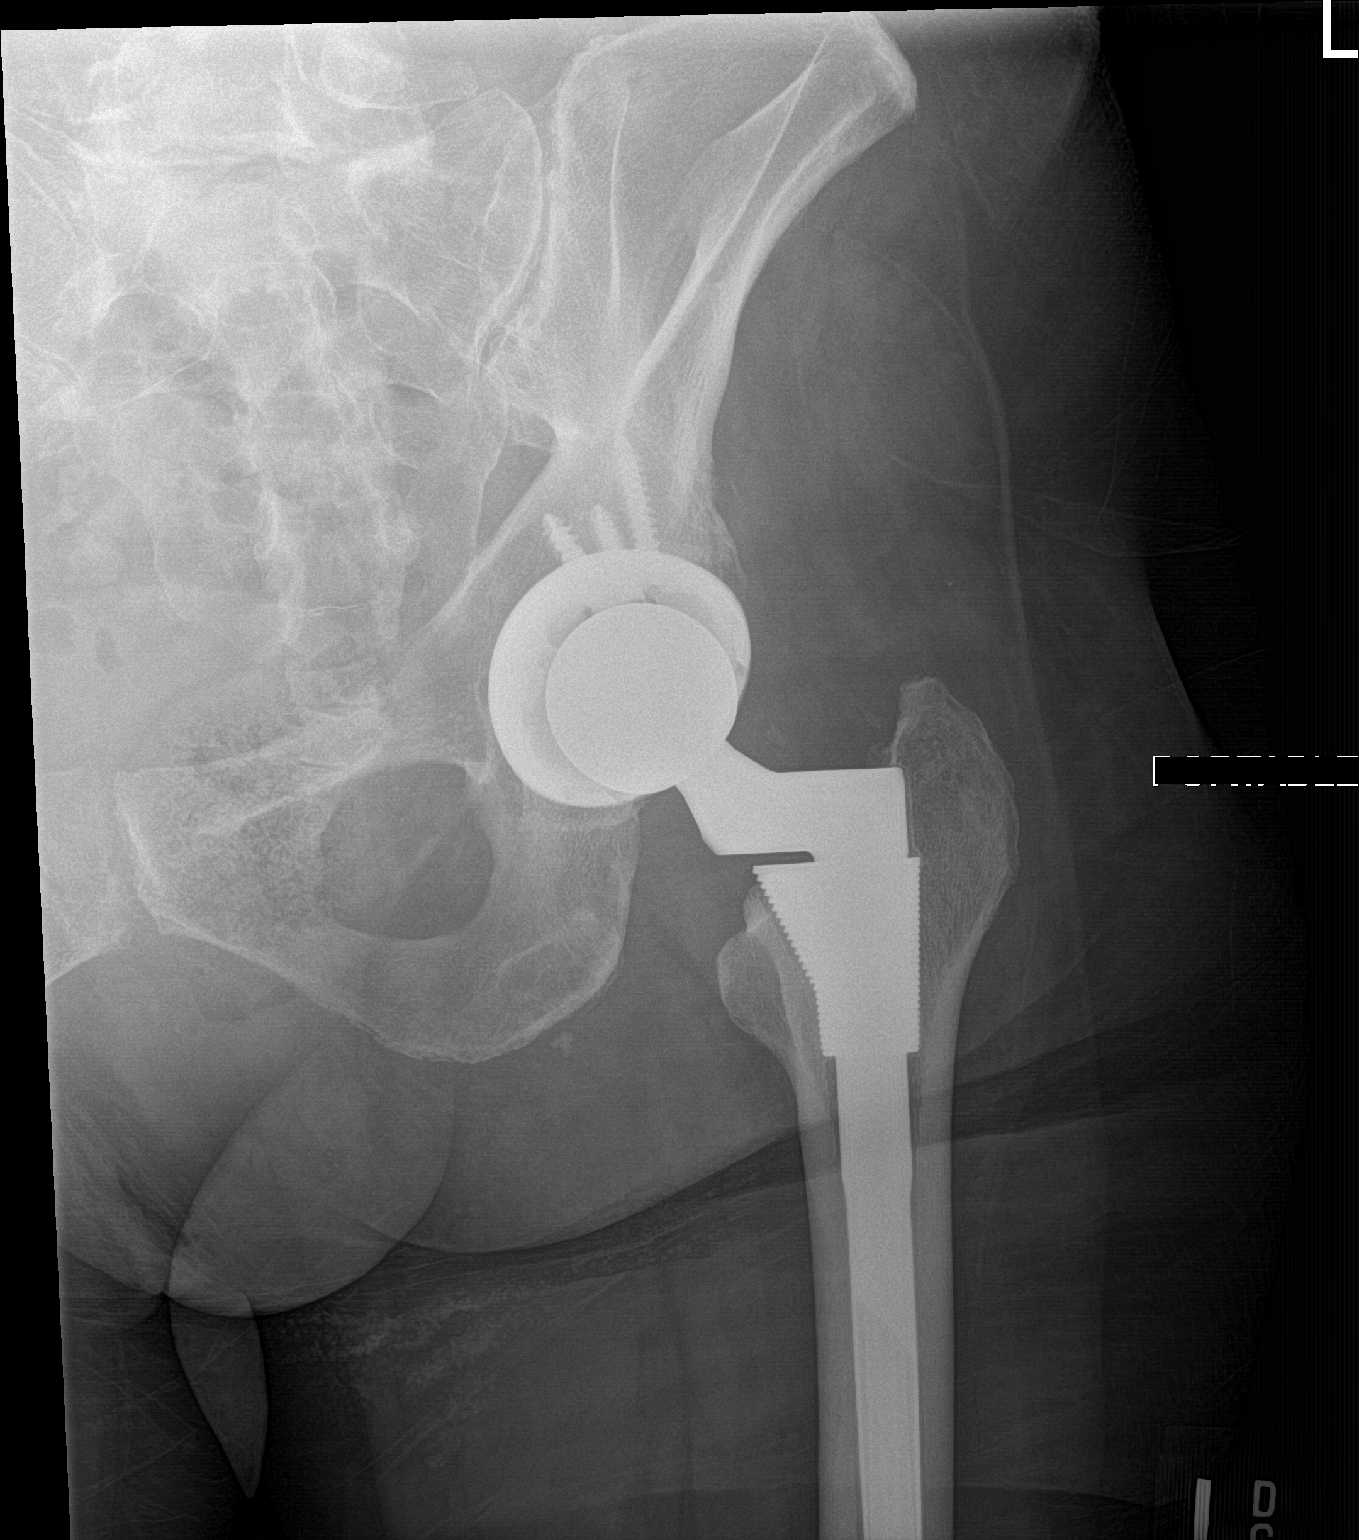

[hip lat]
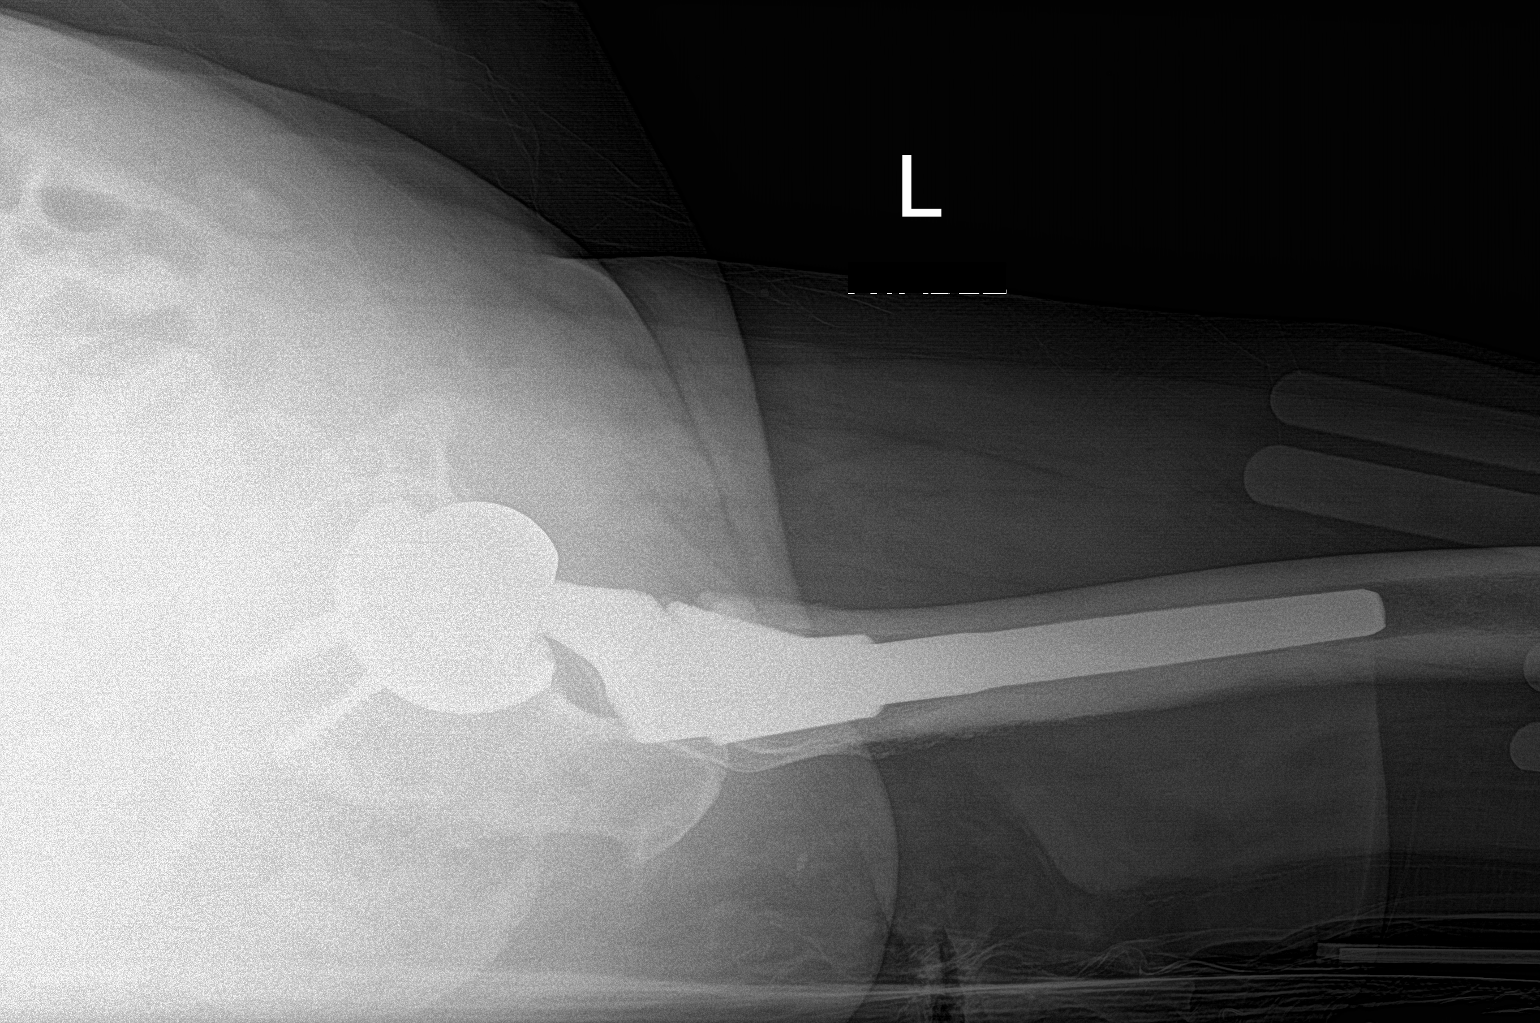

[2 of 2 positions shown; findings below may reference images not displayed]

FINDINGS: There has been interval reduction of the prosthetic left hip with
anatomic alignment of the prosthetic femoral head within the
acetabulum. There is no evidence of hardware loosening or fracture.
There is curvilinear lucency through the left superior lateral
acetabulum, immediately adjacent to the prosthesis, which may
represent an avulsion fracture.
IMPRESSION: Status post left prosthetic hip relocation.

Possible avulsion fracture of the left superior lateral bony
acetabulum.

## 2017-06-03 DIAGNOSIS — R309 Painful micturition, unspecified: Secondary | ICD-10-CM | POA: Diagnosis not present

## 2017-06-06 ENCOUNTER — Other Ambulatory Visit: Payer: Self-pay | Admitting: Interventional Cardiology

## 2017-06-07 DIAGNOSIS — M9901 Segmental and somatic dysfunction of cervical region: Secondary | ICD-10-CM | POA: Diagnosis not present

## 2017-06-07 DIAGNOSIS — F329 Major depressive disorder, single episode, unspecified: Secondary | ICD-10-CM | POA: Diagnosis not present

## 2017-06-07 DIAGNOSIS — N390 Urinary tract infection, site not specified: Secondary | ICD-10-CM | POA: Diagnosis not present

## 2017-06-07 DIAGNOSIS — M9903 Segmental and somatic dysfunction of lumbar region: Secondary | ICD-10-CM | POA: Diagnosis not present

## 2017-06-07 DIAGNOSIS — M9902 Segmental and somatic dysfunction of thoracic region: Secondary | ICD-10-CM | POA: Diagnosis not present

## 2017-06-07 DIAGNOSIS — R300 Dysuria: Secondary | ICD-10-CM | POA: Diagnosis not present

## 2017-06-09 DIAGNOSIS — M9903 Segmental and somatic dysfunction of lumbar region: Secondary | ICD-10-CM | POA: Diagnosis not present

## 2017-06-09 DIAGNOSIS — M9902 Segmental and somatic dysfunction of thoracic region: Secondary | ICD-10-CM | POA: Diagnosis not present

## 2017-06-09 DIAGNOSIS — M9901 Segmental and somatic dysfunction of cervical region: Secondary | ICD-10-CM | POA: Diagnosis not present

## 2017-06-21 DIAGNOSIS — M9901 Segmental and somatic dysfunction of cervical region: Secondary | ICD-10-CM | POA: Diagnosis not present

## 2017-06-21 DIAGNOSIS — M9902 Segmental and somatic dysfunction of thoracic region: Secondary | ICD-10-CM | POA: Diagnosis not present

## 2017-06-21 DIAGNOSIS — R3915 Urgency of urination: Secondary | ICD-10-CM | POA: Diagnosis not present

## 2017-06-21 DIAGNOSIS — M9903 Segmental and somatic dysfunction of lumbar region: Secondary | ICD-10-CM | POA: Diagnosis not present

## 2017-06-22 DIAGNOSIS — R350 Frequency of micturition: Secondary | ICD-10-CM | POA: Diagnosis not present

## 2017-06-22 DIAGNOSIS — N3000 Acute cystitis without hematuria: Secondary | ICD-10-CM | POA: Diagnosis not present

## 2017-06-22 DIAGNOSIS — N393 Stress incontinence (female) (male): Secondary | ICD-10-CM | POA: Diagnosis not present

## 2017-06-22 DIAGNOSIS — R3915 Urgency of urination: Secondary | ICD-10-CM | POA: Diagnosis not present

## 2017-06-22 DIAGNOSIS — R351 Nocturia: Secondary | ICD-10-CM | POA: Diagnosis not present

## 2017-06-25 DIAGNOSIS — R030 Elevated blood-pressure reading, without diagnosis of hypertension: Secondary | ICD-10-CM | POA: Diagnosis not present

## 2017-07-05 DIAGNOSIS — R300 Dysuria: Secondary | ICD-10-CM | POA: Insufficient documentation

## 2017-07-05 DIAGNOSIS — M9902 Segmental and somatic dysfunction of thoracic region: Secondary | ICD-10-CM | POA: Diagnosis not present

## 2017-07-05 DIAGNOSIS — N3020 Other chronic cystitis without hematuria: Secondary | ICD-10-CM | POA: Insufficient documentation

## 2017-07-05 DIAGNOSIS — M9903 Segmental and somatic dysfunction of lumbar region: Secondary | ICD-10-CM | POA: Diagnosis not present

## 2017-07-05 DIAGNOSIS — M9901 Segmental and somatic dysfunction of cervical region: Secondary | ICD-10-CM | POA: Diagnosis not present

## 2017-07-08 DIAGNOSIS — N390 Urinary tract infection, site not specified: Secondary | ICD-10-CM | POA: Diagnosis not present

## 2017-07-08 DIAGNOSIS — N3020 Other chronic cystitis without hematuria: Secondary | ICD-10-CM | POA: Diagnosis not present

## 2017-07-08 DIAGNOSIS — R300 Dysuria: Secondary | ICD-10-CM | POA: Diagnosis not present

## 2017-07-11 DIAGNOSIS — D649 Anemia, unspecified: Secondary | ICD-10-CM | POA: Diagnosis not present

## 2017-07-11 DIAGNOSIS — N390 Urinary tract infection, site not specified: Secondary | ICD-10-CM | POA: Diagnosis not present

## 2017-07-11 DIAGNOSIS — F329 Major depressive disorder, single episode, unspecified: Secondary | ICD-10-CM | POA: Diagnosis not present

## 2017-08-09 DIAGNOSIS — M9902 Segmental and somatic dysfunction of thoracic region: Secondary | ICD-10-CM | POA: Diagnosis not present

## 2017-08-09 DIAGNOSIS — M9903 Segmental and somatic dysfunction of lumbar region: Secondary | ICD-10-CM | POA: Diagnosis not present

## 2017-08-09 DIAGNOSIS — M9901 Segmental and somatic dysfunction of cervical region: Secondary | ICD-10-CM | POA: Diagnosis not present

## 2017-08-12 ENCOUNTER — Emergency Department (HOSPITAL_COMMUNITY)
Admission: EM | Admit: 2017-08-12 | Discharge: 2017-08-12 | Disposition: A | Discharge status: Home / Self Care | Payer: Medicare Other | Attending: Emergency Medicine | Admitting: Emergency Medicine

## 2017-08-12 ENCOUNTER — Emergency Department (HOSPITAL_COMMUNITY): Payer: Medicare Other

## 2017-08-12 ENCOUNTER — Encounter (HOSPITAL_COMMUNITY): Payer: Self-pay | Admitting: Emergency Medicine

## 2017-08-12 ENCOUNTER — Other Ambulatory Visit: Payer: Self-pay

## 2017-08-12 DIAGNOSIS — S0083XA Contusion of other part of head, initial encounter: Secondary | ICD-10-CM | POA: Diagnosis not present

## 2017-08-12 DIAGNOSIS — S0990XA Unspecified injury of head, initial encounter: Secondary | ICD-10-CM | POA: Diagnosis present

## 2017-08-12 DIAGNOSIS — N182 Chronic kidney disease, stage 2 (mild): Secondary | ICD-10-CM | POA: Diagnosis not present

## 2017-08-12 DIAGNOSIS — Y9301 Activity, walking, marching and hiking: Secondary | ICD-10-CM | POA: Insufficient documentation

## 2017-08-12 DIAGNOSIS — Y9289 Other specified places as the place of occurrence of the external cause: Secondary | ICD-10-CM | POA: Diagnosis not present

## 2017-08-12 DIAGNOSIS — Y999 Unspecified external cause status: Secondary | ICD-10-CM | POA: Diagnosis not present

## 2017-08-12 DIAGNOSIS — Z87891 Personal history of nicotine dependence: Secondary | ICD-10-CM | POA: Diagnosis not present

## 2017-08-12 DIAGNOSIS — Z7982 Long term (current) use of aspirin: Secondary | ICD-10-CM | POA: Insufficient documentation

## 2017-08-12 DIAGNOSIS — I129 Hypertensive chronic kidney disease with stage 1 through stage 4 chronic kidney disease, or unspecified chronic kidney disease: Secondary | ICD-10-CM | POA: Insufficient documentation

## 2017-08-12 DIAGNOSIS — Z96653 Presence of artificial knee joint, bilateral: Secondary | ICD-10-CM | POA: Diagnosis not present

## 2017-08-12 DIAGNOSIS — Z96642 Presence of left artificial hip joint: Secondary | ICD-10-CM | POA: Insufficient documentation

## 2017-08-12 DIAGNOSIS — W01198A Fall on same level from slipping, tripping and stumbling with subsequent striking against other object, initial encounter: Secondary | ICD-10-CM | POA: Diagnosis not present

## 2017-08-12 DIAGNOSIS — N390 Urinary tract infection, site not specified: Secondary | ICD-10-CM | POA: Diagnosis not present

## 2017-08-12 DIAGNOSIS — S098XXA Other specified injuries of head, initial encounter: Secondary | ICD-10-CM | POA: Diagnosis not present

## 2017-08-12 NOTE — ED Triage Notes (Signed)
Patient tripped and fell outside of her doctor's office this morning an hit her forehead against sidewalk. Denies LOC, denies pain anywhere except on her forehead. Large hematoma above right eyebrow. Denies anticoagulation, takes 81mg  aspirin daily which she did not take today.

## 2017-08-12 NOTE — Discharge Instructions (Addendum)
YOUR HEAD CT DID NOT SHOW ANY INJURIES OF THE BRAIN, BUT IT DOES LOOK LIKE YOU MAY HAVE HAD A STROKE IN THE PAST. THE VENTRICLES IN YOUR BRAIN ALSO LOOKED SLIGHTLY LARGER THAN NORMAL--THIS CAN SOMETIMES BE SEEN WITH A CONDITION CALLED NORMAL PRESSURE HYDROCEPHALUS. PLEASE FOLLOW UP WITH YOUR PRIMARY CARE PROVIDER TO HAVE AN OUTPATIENT STROKE WORK UP.

## 2017-08-12 NOTE — ED Provider Notes (Signed)
[facility] Provider Note   CSN: [id number] Arrival date & time: 08/12/17  1138     History   Chief Complaint Chief Complaint  Patient presents with  . Fall    HPI [place] [name] is a 84 y.o. female.  84 year old female with past medical history below including CKD, thoracic aneurysm, aortic insufficiency who presents with fall.  This morning, she tripped and fell, striking her forehead on a sidewalk.  She did not lose consciousness.  She has had no vomiting, confusion, speech problems, vision changes, or extremity weakness since the fall.  She reports mild to moderate pain in her forehead where she sustained a bruise but denies any other areas of pain including no hip, back, or extremity pain. Takes 81mg  aspirin but no other anticoagulants. She is UTD on tetanus.  The history is provided by the patient.  Fall     Past Medical History:  Diagnosis Date  . Anemia    CHRONIC  . Aortic insufficiency    a. 2D echo 08/29/15: EF 55-60%, mild LVH, diastolic dysfunction, elevated LV filling pressure, mild AI, severe LAE, mild RAE, mild TR, dilated descending thoracic aorta just distal to the takeoff of the left subclavian, measuring 4.1 cm, no pericardial effusion.  . Arthritis    OA-SOME BACK AND NECK PAIN,  GOES TO CHIROPRACTOR TWICE A MONTH;  HX OF JOINT REPLACEMENTS   . Cancer ([place])    SKIN CANCERS REMOVED FROM LEGS  . CKD (chronic kidney disease), stage II   . Essential hypertension   . GERD (gastroesophageal reflux disease)   . PSVT (paroxysmal supraventricular tachycardia) ([place])   . Recurrent Pericarditis    RECURRENT   . Thoracic aneurysm    a. Followed by [facility] -last seen in 12/2013 w/ plan for f/u MRA and thoracic surgery f/u in 2 yrs, but patient does not wish to follow any longer.    Patient Active Problem List   Diagnosis Date Noted  . Anemia 09/22/2015  . h/o pericarditis   . Essential hypertension   . Aortic  insufficiency   . Hip dislocation, left ([place]) 12/05/2013  . Trochanteric bursitis of left hip 11/24/2012  . Aneurysm of thoracic aorta [facility])     Past Surgical History:  Procedure Laterality Date  . APPENDECTOMY    . BREAST EXCISIONAL BIOPSY Left 1992   benign  . EXCISION/RELEASE BURSA HIP Left 11/24/2012   Procedure: LEFT HIP BURSECTOMY AND TENDON REPAIR ;  Surgeon: Gearlean Alf, MD;  Location: [facility];  Service: Orthopedics;  Laterality: Left;  . HIP CLOSED REDUCTION Left 12/19/2012   Procedure: CLOSED MANIPULATION HIP;  Surgeon: Mauri Pole, MD;  Location: [facility];  Service: Orthopedics;  Laterality: Left;  . HIP CLOSED REDUCTION Left 12/05/2013   Procedure: CLOSED MANIPULATION HIP;  Surgeon: Augustin Schooling, MD;  Location: [facility];  Service: Orthopedics;  Laterality: Left;  . HIP CLOSED REDUCTION Left 09/22/2015   Procedure: CLOSED REDUCTION HIP;  Surgeon: Melina Schools, MD;  Location: [facility];  Service: Orthopedics;  Laterality: Left;  . JOINT REPLACEMENT     LEFT TOTAL HIP REPLACEMENT AND REVISIONS X 2  . OOPHORECTOMY     has a partial of one ovary remaingin  . PELVIC LAPAROSCOPY     ovarian cyst removal,   . REVISION TOTAL HIP ARTHROPLASTY     left  . right Achilles Tendon repair  5/13  . TOTAL KNEE ARTHROPLASTY     right  .  TOTAL KNEE ARTHROPLASTY     left  . VAGINAL HYSTERECTOMY     with ovarian cyst removal     OB History    Gravida  2   Para  2   Term      Preterm      AB      Living  2     SAB      TAB      Ectopic      Multiple      Live Births               Home Medications    Prior to Admission medications   Medication Sig Start Date End Date Taking? Authorizing Provider  aspirin 81 MG tablet Take 81 mg by mouth daily.    [provider]  Calcium Carbonate-Vit D-Min (CALCIUM 1200 PO) Take 1 tablet by mouth daily.     [provider]  carboxymethylcellulose (REFRESH TEARS) 0.5 % SOLN Place 1 drop into both eyes  daily.     [provider]  carvedilol (COREG) 12.5 MG tablet Take 6.25-12.5 mg by mouth See admin instructions. 6.25 mg in the morning and 12.5 mg at night    [provider]  cholecalciferol (VITAMIN D) 1000 UNITS tablet Take 1,000 Units by mouth daily.    [provider]  clobetasol cream (TEMOVATE) 4.09 % Apply 1 application topically 2 (two) times daily as needed (itching).  08/08/13   [provider]  diltiazem (CARDIZEM CD) 240 MG 24 hr capsule Take 1 capsule (240 mg total) by mouth every evening. 02/14/17   Belva Crome, MD  estrogens, conjugated, (PREMARIN) 0.3 MG tablet Take 1 tablet (0.3 mg total) by mouth daily. 08/23/16   Fontaine, Belinda Block, MD  HYDROcodone-acetaminophen (NORCO/VICODIN) 5-325 MG tablet Take 1 tablet every 6 (six) hours as needed by mouth for moderate pain. 01/22/17   Jola Schmidt, MD  meloxicam (MOBIC) 7.5 MG tablet Take 7.5 mg by mouth daily as needed for pain.  03/21/15   [provider]  mupirocin ointment (BACTROBAN) 2 % Apply 1 application topically 2 (two) times daily. Patient taking differently: Apply 1 application topically 2 (two) times daily as needed (for cuts).  12/08/13   Liam Graham, PA-C  omega-3 acid ethyl esters (LOVAZA) 1 g capsule Take 2 g by mouth daily.    [provider]  valsartan (DIOVAN) 160 MG tablet TAKE 1 TABLET BY MOUTH EVERY DAY 06/07/17   Belva Crome, MD    Family History Family History  Problem Relation Age of Onset  . Hypertension Mother   . Stroke Mother   . Heart attack Father 47    Social History Social History   Tobacco Use  . Smoking status: Former Smoker    Types: Cigarettes  . Smokeless tobacco: Never Used  . Tobacco comment: quit age 39  Substance Use Topics  . Alcohol use: No    Alcohol/week: 0.0 oz  . Drug use: No     Allergies   Keflex [cephalexin]; Shellfish allergy; Shellfish-derived products; Clindamycin/lincomycin; Lincomycin;  Sugar-protein-starch; Tape; Ciprofloxacin; Sulfa antibiotics; and Sulfites   Review of Systems Review of Systems All other systems reviewed and are negative except that which was mentioned in HPI   Physical Exam Updated Vital Signs BP (!) 190/76 (BP Location: Right Arm)   Pulse 62   Temp (!) 97.5 F (36.4 C) (Oral)   Resp 14   SpO2 99%   Physical Exam  Constitutional: She is oriented to person, place, and time. She appears well-developed and well-nourished. No distress.  Awake, alert  HENT:  Head: Normocephalic.  Large hematoma central forehead with swelling extending to upper eyelids, ecchymosis from forehead down nasal bridge to below b/l eyes; small abrasion nasal bridge and just below nose  Eyes: Pupils are equal, round, and reactive to light. Conjunctivae and EOM are normal.  Neck: Neck supple.  Cardiovascular: Normal rate, regular rhythm and normal heart sounds.  No murmur heard. Pulmonary/Chest: Effort normal and breath sounds normal. No respiratory distress.  Abdominal: Soft. Bowel sounds are normal. She exhibits no distension. There is no tenderness.  Musculoskeletal: She exhibits no edema.  Neurological: She is alert and oriented to person, place, and time. She has normal reflexes. No cranial nerve deficit. She exhibits normal muscle tone.  Fluent speech, normal finger-to-nose testing, negative pronator drift 5/5 strength and normal sensation x all 4 extremities  Skin: Skin is warm and dry.  Psychiatric: She has a normal mood and affect. Judgment and thought content normal.  Nursing note and vitals reviewed.    ED Treatments / Results  Labs (all labs ordered are listed, but only abnormal results are displayed) Labs Reviewed - No data to display  EKG None  Radiology Ct Head Wo Contrast  Result Date: 08/12/2017 CLINICAL DATA:  Patient tripped and fell hitting forehead again sidewalk today. Initial encounter. EXAM: CT HEAD WITHOUT CONTRAST TECHNIQUE:  Contiguous axial images were obtained from the base of the skull through the vertex without intravenous contrast. COMPARISON:  01/05/2008 FINDINGS: Brain: There is no evidence of acute infarct, intracranial hemorrhage, mass, midline shift, or extra-axial fluid collection. Moderate lateral and third ventriculomegaly has mildly progressed. The fourth ventricle is mildly enlarged. The cerebral sulci are not enlarged, and there is gyral crowding at the vertex. Patchy subcortical and periventricular white matter hypodensities have progressed and are nonspecific but compatible with moderate chronic small vessel ischemic disease. There is a chronic lacunar infarct at the anterosuperior aspect of the right insula. Vascular: Calcified atherosclerosis at the skull base. No hyperdense vessel. Skull: No fracture or focal osseous lesion. Sinuses/Orbits: The paranasal sinuses and right mastoid air cells are clear. There is chronic left mastoid tip air cell opacification. Bilateral cataract extraction is noted. Other: Moderate-sized forehead hematoma. IMPRESSION: 1. No evidence of acute intracranial abnormality. 2. Forehead hematoma. 3. Chronic ventriculomegaly, mildly progressed from 2009 and which may reflect normal pressure hydrocephalus in the appropriate clinical setting. 4. Moderate chronic small vessel ischemic disease, also progressed. Electronically Signed   By: Logan Bores M.D.   On: 08/12/2017 13:09    Procedures Procedures (including critical care time)  Medications Ordered in ED Medications - No data to display   Initial Impression / Assessment and Plan / ED Course  I have reviewed the triage vital signs and the nursing notes.  Pertinent imaging results that were available during my care of the patient were reviewed by me and considered in my medical decision making (see chart for details).    GCS 15 on exam, comfortable and w/ reassuring neuro exam. Head CT negative for intracranial injury. She has  chronic small vessel disease and ventriculomegaly.  I discussed these incidental findings with her and the need for PCP follow-up for further work-up.  She denies any recent changes in her chronic urinary problems and denies any balance problems.  I have extensively reviewed return precautions with her including sudden headache, vomiting, ataxia, or confusion.  She voiced understanding  and was discharged in satisfactory condition.  Final Clinical Impressions(s) / ED Diagnoses   Final diagnoses:  Injury of head, initial encounter  Traumatic hematoma of forehead, initial encounter    ED Discharge Orders    None       Rosalene Wardrop, Wenda Overland, MD 08/12/17 2045

## 2017-08-12 NOTE — ED Triage Notes (Signed)
Called x 2 no answer

## 2017-08-15 DIAGNOSIS — W19XXXD Unspecified fall, subsequent encounter: Secondary | ICD-10-CM | POA: Diagnosis not present

## 2017-08-15 DIAGNOSIS — R9089 Other abnormal findings on diagnostic imaging of central nervous system: Secondary | ICD-10-CM | POA: Diagnosis not present

## 2017-08-15 DIAGNOSIS — G9389 Other specified disorders of brain: Secondary | ICD-10-CM | POA: Diagnosis not present

## 2017-08-15 DIAGNOSIS — S0083XA Contusion of other part of head, initial encounter: Secondary | ICD-10-CM | POA: Diagnosis not present

## 2017-08-18 ENCOUNTER — Telehealth: Payer: Self-pay | Admitting: Interventional Cardiology

## 2017-08-18 NOTE — Telephone Encounter (Signed)
Pt calling   Pt c/o BP issue: STAT if pt c/o blurred vision, one-sided weakness or slurred speech  1. What are your last 5 BP readings?  180/81 this morning  2. Are you having any other symptoms (ex. Dizziness, headache, blurred vision, passed out)? Headache   3. What is your BP issue? BP is high

## 2017-08-18 NOTE — Telephone Encounter (Signed)
Left message on machine for patient to call me back re: her BP.

## 2017-08-18 NOTE — Telephone Encounter (Signed)
Spoke with the patient re: her BP. She fell and hit her head 08/12/17. She says that her BP has been running high since then. It has been up and down between 150-180/60-70. This morning it is 180/78. She denies any dizziness, shortness of breath, and chest pain but headache ever since she hit her head. When I asked about her meds, she first reported that she does not take her meds "regularly". She says that she does not take her coreg every day or her Diovan. So when I questioned her further about how she has been taking them she said to "never mind she is taking them". I asked her to go ahead and take her Diovan and her Coreg and I will check back with her in about an hour to see what her BP was running. I also asked her to change batteries in her cuff. She also reports increased anxiety since moving into [place] with her husband and that he requires a lot of care form her. I asked to rest for a while after taking her meds and to check her BP when I call back. Pt verbalized understanding and agreed.

## 2017-08-18 NOTE — Telephone Encounter (Signed)
Spoke with the patient again and she reported BP has improved some to 175/79. She denies any symptoms except increased anxiety due to her water being shut off for repairs today and having to help her husband bathe before that. She said she did take her meds and rested briefly prior to taking her BP again. I advised pt to continue to take her meds regularly and to follow a low sodium diet. To continue to check her BP and to document the readings and the times and to keep a record of when she takes her meds. I will forward to Dr. Willeen [place] for further recommendations on possible med changes or when to bring pt back for sooner follow up. Pt Verbalized understanding and agreed. Will call if anything changes. I strongly advised her to take her meds and to take them around the same time each day.

## 2017-08-18 NOTE — Telephone Encounter (Signed)
Blood pressure clinic referral

## 2017-08-19 ENCOUNTER — Telehealth: Payer: Self-pay | Admitting: Interventional Cardiology

## 2017-08-19 NOTE — Telephone Encounter (Signed)
Spoke with pt and scheduled her to see HTN clinic 6/11.  Advised pt to continue monitoring BP 2 hrs after medications and record those and bring them to appt.  Pt verbalized understanding and was in agreement with this plan.

## 2017-08-19 NOTE — Telephone Encounter (Signed)
Pt calling.   Pt wanting a call back from you but wouldn't tell me why.

## 2017-08-19 NOTE — Telephone Encounter (Signed)
Spoke with pt and she states she took Coreg 6.25mg  this AM and took Valsartan at 10A.  Took BP at 0800 and it was 155/71.  Took it again at [facility] and it was 200/83, 6.  Pt states that BP has been running fine until she fell.  Spoke with Dr. Tamala Julian and he said to have pt go ahead and take another 6.25mg  Coreg.  Advised pt to take another half tablet and recheck BP in a couple of hrs.  Advised if any issues over the weekend to call the on call.  Pt wondering about keeping HTN clinic appt on Tuesday d/t it being hard to park here and she is taking care of her husband.  Advised her to call on Monday with BP readings and we would cancel appt at that time if BP doing ok.  Pt verbalized understanding and was in agreement with this plan.

## 2017-08-23 ENCOUNTER — Ambulatory Visit (INDEPENDENT_AMBULATORY_CARE_PROVIDER_SITE_OTHER): Payer: Medicare Other | Admitting: Pharmacist

## 2017-08-23 ENCOUNTER — Telehealth: Payer: Self-pay | Admitting: Interventional Cardiology

## 2017-08-23 VITALS — BP 150/66 | HR 63

## 2017-08-23 DIAGNOSIS — M9901 Segmental and somatic dysfunction of cervical region: Secondary | ICD-10-CM | POA: Diagnosis not present

## 2017-08-23 DIAGNOSIS — M9902 Segmental and somatic dysfunction of thoracic region: Secondary | ICD-10-CM | POA: Diagnosis not present

## 2017-08-23 DIAGNOSIS — I10 Essential (primary) hypertension: Secondary | ICD-10-CM | POA: Diagnosis not present

## 2017-08-23 DIAGNOSIS — M9903 Segmental and somatic dysfunction of lumbar region: Secondary | ICD-10-CM | POA: Diagnosis not present

## 2017-08-23 MED ORDER — CARVEDILOL 12.5 MG PO TABS: 12.5000 mg | ORAL_TABLET | Freq: Every day | ORAL | 3 refills | Status: DC

## 2017-08-23 NOTE — Patient Instructions (Signed)
It was nice to meet you today  Increase your carvedilol to the 1 tablet twice a day   Move your valsartan dose to dinner time  Continue taking your other medications  Follow up in 2 weeks in the blood pressure clinic  Call [facility], Pharmacist with any concerns [phone]

## 2017-08-23 NOTE — Telephone Encounter (Signed)
New Message:      Pt c/o BP issue: STAT if pt c/o blurred vision, one-sided weakness or slurred speech  1. What are your last 5 BP readings?  188/80 199/62 174/83 183/77  2. Are you having any other symptoms (ex. Dizziness, headache, blurred vision, passed out)? Pt states her head feels funny  3. What is your BP issue? Pt's bp is very high and she is very concerned

## 2017-08-23 NOTE — Telephone Encounter (Signed)
9:10pm yesterday- 188/80 1AM- 199/62 5:30A- 174/83- took Coreg 6.25mg  7:15A- 176/79- Took 12.5mg  Coreg and 160mg  Valsartan  Pt states BP has been elevated all weekend and she became very concerned about the 199/62 from last night.  Pt states vision is fine and she feels fine to drive.  Denies HA at this time.  Advised if any changes to let us know, otherwise keep appt with HTN clinic this afternoon.  Advised to bring BP readings and cuff to appt.  Pt verbalized understanding and was appreciative for call.

## 2017-08-23 NOTE — Progress Notes (Signed)
Patient ID: [name]                 DOB: 1930-02-18                      MRN: [id number]     HPI: [name] is a 84 y.o. female referred by Dr. Tamala Julian to HTN clinic. PMH is significant for HTN, GERD, and ascending aortic aneurysm. She called clinic with reports of elevated BP 150-180/60-70 over the past few weeks since she had a fall on 5/31. She did not report any balance issues, but rather tripped over something. She went to the ED and had a head scan which did not show any abnormalities aside from forehead hematoma and chronic small vessel ischemic disease, however she reports her BP has been elevated since this time. Before her fall, she reports home readings of 427-062 systolic. Pt presents to HTN clinic for further management.  Pt presents today in good spirits. She took an additional 6.25mg  of carvedilol this morning due to elevated BP. BP in clinic today 150/66, home cuff reading 159/67. She has had her home bicep cuff for about 5 years. She used to have low BP readings with systolic < 376 when she took 12.5mg  of carvedilol BID and would feel tired and a bit dizzy during these times. She has not experienced any headaches or blurred vision with her more recent elevated BP readings. She reports more elevated readings have occurred in the evenings. She is frustrated that she does not feel like herself since her fall. She has not been able to work out, which she usually does at the gym most days a week (strength training classes, yoga, body pump classes, etc). She moved to [place] with her 91 year old husband 8-9 months ago. 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 She is worried about her elevated blood pressure since her mother and brother have both had strokes. She rarely uses her meloxicam.   Current HTN meds: carvedilol 6.25mg  AM and 12.5mg  PM, diltiazem 240mg  daily (PM), valsartan 160mg  daily (AM) BP goal: <140/36mmHg due to age  Family History: The patient's family  history includes Heart attack (age of onset: 40) in her father; Hypertension in her mother; Stroke in her mother and brother.  Social History: Former smoker, quit at age 44.   Diet: 2 cups of "weak" coffee in the morning. Likes oatmeal and toast. Does not add salt to food. Does not eat fried food, sweets, or junk food.  Exercise: Body pump classes once a week at the gym. Weights on Tuesdays and Thursdays at the gym, also does stretching and leg exercises. Yoga class once a week.  Home BP readings: 283-151 systolic/70-80s, did have 1 low reading of 119/63  Wt Readings from Last 3 Encounters:  01/22/17 148 lb (67.1 kg)  01/04/17 145 lb 6.4 oz (66 kg)  08/23/16 153 lb (69.4 kg)   BP Readings from Last 3 Encounters:  08/12/17 (!) 205/74  01/22/17 (!) 162/64  01/04/17 (!) 154/64   Pulse Readings from Last 3 Encounters:  08/12/17 63  01/22/17 (!) 50  01/04/17 70    Renal function: CrCl cannot be calculated (Patient's most recent lab result is older than the maximum 21 days allowed.).  Past Medical History:  Diagnosis Date  . Anemia    CHRONIC  . Aortic insufficiency    a. 2D echo 08/29/15: EF 55-60%, mild LVH, diastolic dysfunction, elevated LV filling pressure, mild  AI, severe LAE, mild RAE, mild TR, dilated descending thoracic aorta just distal to the takeoff of the left subclavian, measuring 4.1 cm, no pericardial effusion.  . Arthritis    OA-SOME BACK AND NECK PAIN,  GOES TO CHIROPRACTOR TWICE A MONTH;  HX OF JOINT REPLACEMENTS   . Cancer ([place])    SKIN CANCERS REMOVED FROM LEGS  . CKD (chronic kidney disease), stage II   . Essential hypertension   . GERD (gastroesophageal reflux disease)   . PSVT (paroxysmal supraventricular tachycardia) ([place])   . Recurrent Pericarditis    RECURRENT   . Thoracic aneurysm    a. Followed by [facility] -last seen in 12/2013 w/ plan for f/u MRA and thoracic surgery f/u in 2 yrs, but patient does not wish to follow any longer.    Current Outpatient  Medications on File Prior to Visit  Medication Sig Dispense Refill  . aspirin 81 MG tablet Take 81 mg by mouth daily.    . Calcium Carbonate-Vit D-Min (CALCIUM 1200 PO) Take 1 tablet by mouth daily.     . carboxymethylcellulose (REFRESH TEARS) 0.5 % SOLN Place 1 drop into both eyes daily.     . carvedilol (COREG) 12.5 MG tablet Take 6.25-12.5 mg by mouth See admin instructions. 6.25 mg in the morning and 12.5 mg at night    . cholecalciferol (VITAMIN D) 1000 UNITS tablet Take 1,000 Units by mouth daily.    . clobetasol cream (TEMOVATE) 4.27 % Apply 1 application topically 2 (two) times daily as needed (itching).     Marland Kitchen diltiazem (CARDIZEM CD) 240 MG 24 hr capsule Take 1 capsule (240 mg total) by mouth every evening. 90 capsule 3  . estrogens, conjugated, (PREMARIN) 0.3 MG tablet Take 1 tablet (0.3 mg total) by mouth daily. 90 tablet 4  . HYDROcodone-acetaminophen (NORCO/VICODIN) 5-325 MG tablet Take 1 tablet every 6 (six) hours as needed by mouth for moderate pain. 8 tablet 0  . meloxicam (MOBIC) 7.5 MG tablet Take 7.5 mg by mouth daily as needed for pain.     . mupirocin ointment (BACTROBAN) 2 % Apply 1 application topically 2 (two) times daily. (Patient taking differently: Apply 1 application topically 2 (two) times daily as needed (for cuts). ) 30 g 0  . omega-3 acid ethyl esters (LOVAZA) 1 g capsule Take 2 g by mouth daily.    . valsartan (DIOVAN) 160 MG tablet TAKE 1 TABLET BY MOUTH EVERY DAY 90 tablet 1   No current facility-administered medications on file prior to visit.     Allergies  Allergen Reactions  . Keflex [Cephalexin] Other (See Comments)    Pt states this causes eye problems and STROKE-LIKE SYMPTOMS!!  . Shellfish Allergy Hives and Rash  . Shellfish-Derived Products Hives and Rash  . Clindamycin/Lincomycin Rash  . Lincomycin Rash  . Sugar-Protein-Starch Other (See Comments)    Sugary foods cause legs to cramp  . Tape Other (See Comments)    Please use paper tape or  Coban Wrap; patient's skin tears VERY easily!!  . Ciprofloxacin Rash  . Sulfa Antibiotics Hives and Rash  . Sulfites Hives and Rash     Assessment/Plan:  1. Hypertension - BP better in clinic than most recent home readings since recent fall. Will increase carvedilol to 12.5mg  BID and continue other medications. Will move valsartan to evening dosing to target higher evening BP readings. Encouraged pt to record her BP readings at home and look for trends regarding elevated BP readings. Encouraged her to slowly  start back with her exercise regimen as stopping this over the past few weeks may have contributed to higher BP readings as well. F/u in HTN clinic in 2 weeks.   Megan E. Supple, PharmD, BCACP, [facility] [address], [place], [place] [address] Phone: [phone]; Fax: [phone] 08/23/2017 2:57 PM

## 2017-08-23 NOTE — Telephone Encounter (Signed)
Spoke with pt who has c/o HTN since her recent fall. This morning her BP measured 183/77, HR 66. She has taken 12.5mg  Coreg and 160mg  Valsartan this morning. She is calling to see if she can drive herself to her HTN clinic appt today. I advised her to try and find a driver or hire a cab given she has some sx of HA and "not too bad vision" per pt. I advised her I would forward this to Dr Thompson Caul nurse for further evaluation.

## 2017-08-24 ENCOUNTER — Encounter: Payer: Medicare Other | Admitting: Gynecology

## 2017-08-25 ENCOUNTER — Encounter: Payer: Self-pay | Admitting: Neurology

## 2017-08-25 ENCOUNTER — Ambulatory Visit: Payer: Medicare Other | Admitting: Neurology

## 2017-08-25 ENCOUNTER — Ambulatory Visit (INDEPENDENT_AMBULATORY_CARE_PROVIDER_SITE_OTHER): Payer: Medicare Other | Admitting: Neurology

## 2017-08-25 VITALS — BP 210/82 | HR 63 | Ht 64.0 in | Wt 144.5 lb

## 2017-08-25 DIAGNOSIS — E538 Deficiency of other specified B group vitamins: Secondary | ICD-10-CM | POA: Diagnosis not present

## 2017-08-25 DIAGNOSIS — M542 Cervicalgia: Secondary | ICD-10-CM

## 2017-08-25 DIAGNOSIS — R269 Unspecified abnormalities of gait and mobility: Secondary | ICD-10-CM

## 2017-08-25 DIAGNOSIS — S060X0D Concussion without loss of consciousness, subsequent encounter: Secondary | ICD-10-CM | POA: Diagnosis not present

## 2017-08-25 NOTE — Progress Notes (Signed)
Reason for visit: Gait disorder  Referring physician: Dr. Autumn Messing is a 84 y.o. female  History of present illness:  [name] is an 84 year old right-handed white female with a history of a recent fall that occurred on 12 Aug 2017.  The patient claims that she was in the doctor's office when she got up to go back to the exam room, she suddenly fell forward, and hit her face on the ground.  The patient did not lose consciousness, she was taken to the emergency room for an evaluation.  The patient underwent a CT scan of the brain that showed evidence of ventriculomegaly, compared to a study that was done in 2009, the size of the ventricular system has enlarged.  The CT scan of the brain that was done in 2009 was done because of confusion and episodic falling.  The patient however claims that she has been doing well with her balance up until the most recent fall.  She claims that she was in yoga, she was working out on a regular basis, she did not require a cane or a walker to get around.  She has not had any problems with memory or confusion.  She has had some urinary incontinence issues, she is followed by Dr. Lawerance Bach.  The patient claims that since the fall at the end of May, she has felt cloudy headed, she is not thinking well, she has a mild headache.  She also reports some neck discomfort.  Her balance has been progressively worsening, she has fallen again, this time bumping her right knee, the last fall was the day prior to this evaluation.  The patient is using a cane for ambulation but this is not enough support to keep her safe.  A CT scan of the cervical spine was not done through the emergency room.  The patient reports no numbness or weakness of the extremities.  Her blood pressure has elevated significantly since the fall.  She comes to this office for an evaluation.  Past Medical History:  Diagnosis Date  . Anemia    CHRONIC  . Aortic insufficiency    a. 2D echo  08/29/15: EF 55-60%, mild LVH, diastolic dysfunction, elevated LV filling pressure, mild AI, severe LAE, mild RAE, mild TR, dilated descending thoracic aorta just distal to the takeoff of the left subclavian, measuring 4.1 cm, no pericardial effusion.  . Arthritis    OA-SOME BACK AND NECK PAIN,  GOES TO CHIROPRACTOR TWICE A MONTH;  HX OF JOINT REPLACEMENTS   . Cancer ([place])    SKIN CANCERS REMOVED FROM LEGS  . CKD (chronic kidney disease), stage II   . Essential hypertension   . GERD (gastroesophageal reflux disease)   . PSVT (paroxysmal supraventricular tachycardia) ([place])   . Recurrent Pericarditis    RECURRENT   . Thoracic aneurysm    a. Followed by [facility] -last seen in 12/2013 w/ plan for f/u MRA and thoracic surgery f/u in 2 yrs, but patient does not wish to follow any longer.    Past Surgical History:  Procedure Laterality Date  . APPENDECTOMY    . BREAST EXCISIONAL BIOPSY Left 1992   benign  . EXCISION/RELEASE BURSA HIP Left 11/24/2012   Procedure: LEFT HIP BURSECTOMY AND TENDON REPAIR ;  Surgeon: Gearlean Alf, MD;  Location: [facility];  Service: Orthopedics;  Laterality: Left;  . HIP CLOSED REDUCTION Left 12/19/2012   Procedure: CLOSED MANIPULATION HIP;  Surgeon: Mauri Pole, MD;  Location: [facility];  Service: Orthopedics;  Laterality: Left;  . HIP CLOSED REDUCTION Left 12/05/2013   Procedure: CLOSED MANIPULATION HIP;  Surgeon: Augustin Schooling, MD;  Location: [facility];  Service: Orthopedics;  Laterality: Left;  . HIP CLOSED REDUCTION Left 09/22/2015   Procedure: CLOSED REDUCTION HIP;  Surgeon: Melina Schools, MD;  Location: [facility];  Service: Orthopedics;  Laterality: Left;  . JOINT REPLACEMENT     LEFT TOTAL HIP REPLACEMENT AND REVISIONS X 2  . OOPHORECTOMY     has a partial of one ovary remaingin  . PELVIC LAPAROSCOPY     ovarian cyst removal,   . REVISION TOTAL HIP ARTHROPLASTY     left  . right Achilles Tendon repair  5/13  . TOTAL KNEE ARTHROPLASTY     right  . TOTAL KNEE  ARTHROPLASTY     left  . VAGINAL HYSTERECTOMY     with ovarian cyst removal    Family History  Problem Relation Age of Onset  . Hypertension Mother   . Stroke Mother   . Heart attack Father 78    Social history:  reports that she has quit smoking. Her smoking use included cigarettes. She has never used smokeless tobacco. She reports that she does not drink alcohol or use drugs.  Medications:  Prior to Admission medications   Medication Sig Start Date End Date Taking? Authorizing Provider  aspirin 81 MG tablet Take 81 mg by mouth daily.   Yes [provider]  Calcium Carbonate-Vit D-Min (CALCIUM 1200 PO) Take 1 tablet by mouth daily.    Yes [provider]  carboxymethylcellulose (REFRESH TEARS) 0.5 % SOLN Place 1 drop into both eyes daily.    Yes [provider]  carvedilol (COREG) 12.5 MG tablet Take 1 tablet (12.5 mg total) by mouth daily. 08/23/17  Yes Belva Crome, MD  cholecalciferol (VITAMIN D) 1000 UNITS tablet Take 1,000 Units by mouth daily.   Yes [provider]  clobetasol cream (TEMOVATE) 9.52 % Apply 1 application topically 2 (two) times daily as needed (itching).  08/08/13  Yes [provider]  diltiazem (CARDIZEM CD) 240 MG 24 hr capsule Take 1 capsule (240 mg total) by mouth every evening. 02/14/17  Yes Belva Crome, MD  estrogens, conjugated, (PREMARIN) 0.3 MG tablet Take 1 tablet (0.3 mg total) by mouth daily. 08/23/16  Yes Fontaine, Belinda Block, MD  HYDROcodone-acetaminophen (NORCO/VICODIN) 5-325 MG tablet Take 1 tablet every 6 (six) hours as needed by mouth for moderate pain. 01/22/17  Yes Jola Schmidt, MD  meloxicam (MOBIC) 7.5 MG tablet Take 7.5 mg by mouth daily as needed for pain.  03/21/15  Yes [provider]  mupirocin ointment (BACTROBAN) 2 % Apply 1 application topically 2 (two) times daily. Patient taking differently: Apply 1 application topically 2 (two) times daily as needed (for cuts).  12/08/13  Yes  Baker, Zachary H, PA-C  omega-3 acid ethyl esters (LOVAZA) 1 g capsule Take 2 g by mouth daily.   Yes [provider]  valsartan (DIOVAN) 160 MG tablet TAKE 1 TABLET BY MOUTH EVERY DAY 06/07/17  Yes Belva Crome, MD      Allergies  Allergen Reactions  . Keflex [Cephalexin] Other (See Comments)    Pt states this causes eye problems and STROKE-LIKE SYMPTOMS!!  . Shellfish Allergy Hives and Rash  . Shellfish-Derived Products Hives and Rash  . Clindamycin/Lincomycin Rash  . Lincomycin Rash  . Sugar-Protein-Starch Other (See Comments)    Sugary foods  cause legs to cramp  . Tape Other (See Comments)    Please use paper tape or Coban Wrap; patient's skin tears VERY easily!!  . Ciprofloxacin Rash  . Sulfa Antibiotics Hives and Rash  . Sulfites Hives and Rash    ROS:  Out of a complete 14 system review of symptoms, the patient complains only of the following symptoms, and all other reviewed systems are negative.  Itching Incontinence of the bladder, nocturnal Gait disorder  Blood pressure (!) 210/82, pulse 63, height 5\' 4"  (1.626 m), weight 144 lb 8 oz (65.5 kg).  Physical Exam  General: The patient is alert and cooperative at the time of the examination.  Eyes: Pupils are equal, round, and reactive to light. Discs are flat bilaterally.  Neck: The neck is supple, no carotid bruits are noted.  Respiratory: The respiratory examination is clear.  Cardiovascular: The cardiovascular examination reveals a regular rate and rhythm, no obvious murmurs or rubs are noted.  Skin: Extremities are with 1+ edema at the ankles.  Neurologic Exam  Mental status: The patient is alert and oriented x 3 at the time of the examination. The patient has apparent normal recent and remote memory, with an apparently normal attention span and concentration ability.  Cranial nerves: Facial symmetry is present. There is good sensation of the face to pinprick and soft touch bilaterally. The  strength of the facial muscles and the muscles to head turning and shoulder shrug are normal bilaterally. Speech is well enunciated, no aphasia or dysarthria is noted. Extraocular movements are full. Visual fields are full. The tongue is midline, and the patient has symmetric elevation of the soft palate. No obvious hearing deficits are noted.  Motor: The motor testing reveals 5 over 5 strength of all 4 extremities. Good symmetric motor tone is noted throughout.  Sensory: Sensory testing is intact to pinprick, soft touch, vibration sensation, and position sense on all 4 extremities, with exception some depression of position sense in both feet. No evidence of extinction is noted.  Coordination: Cerebellar testing reveals good finger-nose-finger and heel-to-shin bilaterally.  Gait and station: Gait is wide-based, unsteady, the patient generally uses a cane for ambulation.  Tandem gait was not attempted.  Romberg is negative but is unsteady.  A time to walk test with 6 laps of 20 feet was performed and 50 seconds.  The patient has significant difficulty with turns.  Reflexes: Deep tendon reflexes are symmetric and normal bilaterally. Toes are downgoing bilaterally.   CT head 08/12/17:  IMPRESSION: 1. No evidence of acute intracranial abnormality. 2. Forehead hematoma. 3. Chronic ventriculomegaly, mildly progressed from 2009 and which may reflect normal pressure hydrocephalus in the appropriate clinical setting. 4. Moderate chronic small vessel ischemic disease, also progressed.  * CT scan images were reviewed online. I agree with the written report.    Assessment/Plan:  1.  Ventriculomegaly by CT of the brain, possible normal pressure hydrocephalus  2.  Recent fall with head trauma  3.  Worsening mentation and gait since recent fall  The patient has had a significant decline in her functional level since the fall at the end of May 2019.  The patient will be sent back for an urgent CT  scan of the brain and cervical spine.  If there is no evidence of cervical trauma or subdural hematoma to explain her recent clinical worsening, she will be sent for lumbar puncture and she will be reevaluated shortly thereafter.  Blood work will be done today.  A  fall with a bump to the head could potentially decompensate normal pressure hydrocephalus.  The patient however was having minimal symptoms of memory problems, gait problems, and bladder control problems prior to the fall in May.  Darlene Alexanders MD 08/25/2017 3:50 PM  [facility] [address] [place], [place] [address]  Phone [phone] Fax [phone]

## 2017-08-25 NOTE — Patient Instructions (Signed)
We will get CT of the head and neck.

## 2017-08-26 ENCOUNTER — Telehealth: Payer: Self-pay | Admitting: Neurology

## 2017-08-26 ENCOUNTER — Telehealth: Payer: Self-pay

## 2017-08-26 ENCOUNTER — Ambulatory Visit
Admission: RE | Admit: 2017-08-26 | Discharge: 2017-08-26 | Disposition: A | Discharge status: Home / Self Care | Payer: Medicare Other | Source: Ambulatory Visit | Attending: Neurology | Admitting: Neurology

## 2017-08-26 DIAGNOSIS — M542 Cervicalgia: Secondary | ICD-10-CM | POA: Diagnosis not present

## 2017-08-26 DIAGNOSIS — G912 (Idiopathic) normal pressure hydrocephalus: Secondary | ICD-10-CM

## 2017-08-26 DIAGNOSIS — S060X0D Concussion without loss of consciousness, subsequent encounter: Secondary | ICD-10-CM | POA: Diagnosis not present

## 2017-08-26 LAB — SEDIMENTATION RATE: Sed Rate: 12 mm/hr (ref 0–40)

## 2017-08-26 LAB — RPR: RPR Ser Ql: NONREACTIVE

## 2017-08-26 LAB — VITAMIN B12: Vitamin B-12: 1473 pg/mL — ABNORMAL HIGH (ref 232–1245)

## 2017-08-26 NOTE — Telephone Encounter (Signed)
Error

## 2017-08-26 NOTE — Telephone Encounter (Signed)
-----   Message from Kathrynn Ducking, MD sent at 08/26/2017  7:30 AM EDT -----   The blood work results are unremarkable. Please call the patient.  ----- Message ----- From: Lavone Neri Lab Results In Sent: 08/26/2017   5:41 AM To: Kathrynn Ducking, MD

## 2017-08-26 NOTE — Telephone Encounter (Signed)
  I called the patient.  CT of the cervical spine did not show fracture.  Degenerative changes are seen.  The patient has an aortic aneurysm, measurement is 42 mm, this appears to be stable from a prior study done in October 2015.  The CT of the brain has not yet been formally read, I have reviewed the study online, I do not see any evidence of a subdural hematoma.  No change from prior study done 2 weeks ago.  The patient will be set up for lumbar puncture, I will need to evaluate the patient in the office within 24 hours after the lumbar puncture, at that time I will check blood work to include a chemistry profile, the patient does have a history of hyponatremia in the past.  CT cervical 08/26/17:  IMPRESSION: Negative for cervical spine fracture  Advanced multilevel degenerative change throughout the cervical spine  Aneurysmal dilatation of the aortic arch 42 mm. CT chest with contrast recommended at this time.

## 2017-08-26 NOTE — Telephone Encounter (Deleted)
[facility] patient needs a new patient packet Dr. Jaynee Eagles gave verbal for code G43.711.

## 2017-08-26 NOTE — Telephone Encounter (Signed)
I called pt, advised her that her blood work was unremarkable. Pt asked me if her sodium level was checked yesterday, which it does not appear to have been. Pt says that she has had low sodium levels in the past, which have caused her to fall and then be sent to the hospital for IV infusions to correct her sodium levels. Pt wants to know if Dr. Jannifer Franklin thinks it necessary to check sodium levels or if she should have her PCP check this lab?

## 2017-08-26 NOTE — Telephone Encounter (Signed)
There was no message in here, please let me know why this was sent thanks

## 2017-08-26 NOTE — Telephone Encounter (Signed)
08/26/2017 Called [facility] needed Reff # [id number] Dana C . Patient will  Walk in To [place] today I have spoke to daughter at [phone] . She will come from [place] to take her.

## 2017-08-29 ENCOUNTER — Telehealth: Payer: Self-pay | Admitting: Neurology

## 2017-08-29 NOTE — Telephone Encounter (Signed)
I called pt, spoke with husband Truman Hayward. He will have his wife call back to make appt.  Can offer 09/12/17 at 12pm, check in 1130am. Dr. Jannifer Franklin is off the weeks of 09/19/17 and 09/26/17

## 2017-08-29 NOTE — Telephone Encounter (Signed)
Pt has returned the call to [facility].  She was informed that the date and time on 07-01 is no longer available.  Pt would like a call back from [place]

## 2017-08-29 NOTE — Telephone Encounter (Signed)
Noted, thank you

## 2017-08-29 NOTE — Telephone Encounter (Signed)
I called the patient, talk with the husband, they claim that her ability to walk has gotten about back to baseline.  If this is the case, we will hold off on the spinal tap for now, I will need to see her in the next 3 or 4 weeks to reevaluate her walking.

## 2017-08-29 NOTE — Telephone Encounter (Signed)
Pt accepted appt. I scheduled it.

## 2017-08-29 NOTE — Telephone Encounter (Signed)
Spoke w/ Lattie Haw and advised the 09/12/17 is still available. She will call pt back to offer appt

## 2017-08-29 NOTE — Telephone Encounter (Signed)
Pt called wanting to inform Dr. Jannifer Franklin that she has been doing better and has been able to walk better over the weekend. Pt requesting a call back to see if Dr. Jannifer Franklin would still like her to get the spinal tap since shes feeling better, please call to advise.

## 2017-08-30 ENCOUNTER — Telehealth: Payer: Self-pay | Admitting: Neurology

## 2017-08-30 ENCOUNTER — Encounter: Payer: Self-pay | Admitting: Neurology

## 2017-08-30 DIAGNOSIS — Z5181 Encounter for therapeutic drug level monitoring: Secondary | ICD-10-CM | POA: Diagnosis not present

## 2017-08-30 NOTE — Telephone Encounter (Signed)
Orders for chemistry profile will be sent.  The patient has a history of hyponatremia in the past.

## 2017-08-30 NOTE — Telephone Encounter (Signed)
Gwyn/daughter-in-law called requesting an order for labs for sodium and B levels faxed to [facility] (f) [phone] Attn: Otila Kluver. Phone rep advised her a message from 6/14 suggested the PCP to order this. She said Dr Bernell List office has not called back about it so she is calling Dr Jannifer Franklin. She said if this gets faxed it can be done today. Please call to advise

## 2017-08-30 NOTE — Telephone Encounter (Signed)
Faxed order over as requested. Received fax confirmation.

## 2017-08-31 ENCOUNTER — Telehealth: Payer: Self-pay | Admitting: Neurology

## 2017-08-31 DIAGNOSIS — G912 (Idiopathic) normal pressure hydrocephalus: Secondary | ICD-10-CM

## 2017-08-31 DIAGNOSIS — G9389 Other specified disorders of brain: Secondary | ICD-10-CM | POA: Diagnosis not present

## 2017-08-31 DIAGNOSIS — R9089 Other abnormal findings on diagnostic imaging of central nervous system: Secondary | ICD-10-CM | POA: Diagnosis not present

## 2017-08-31 NOTE — Telephone Encounter (Signed)
Dr. Jannifer Franklin- FYI. Waiting on fax still.

## 2017-08-31 NOTE — Telephone Encounter (Signed)
I called the patient.  The sodium level has dropped to 124.  I have asked the patient to cut her fluid intake by half, she indicates that she is not aware that she has ever had a work-up for her chronic hyponatremia.  The patient may require a full work-up for this to include thyroid profile, cortisol levels, etc.  I will send this note to the primary care physician.

## 2017-08-31 NOTE — Telephone Encounter (Signed)
Took call from phone staff. Spoke with Museum/gallery conservator from [employer]. She called to report critical lab: Sodium 124. I asked her to fax results to [phone].

## 2017-08-31 NOTE — Telephone Encounter (Signed)
I got a call today from Dr. Inda Merlin.  He is currently evaluating the patient for chronic hyponatremia.  During his evaluation, the patient is using a walker for ambulation, she is unable to ambulate independently, her walking has deteriorated again.  She is having increasing problems with urinary incontinence.  The patient likely does have normal pressure hydrocephalus.  I will get her set up for a high-volume lumbar puncture, I will see her back within 24 hours after the spinal tap is done.

## 2017-09-01 NOTE — Telephone Encounter (Signed)
Order has been sent to [facility] Will call patient to schedule . Telephone [phone] Angelita Ingles [phone] .

## 2017-09-01 NOTE — Telephone Encounter (Signed)
Spoke with patient to schedule a follow up with Dr Jannifer Franklin 24 hours s/p LP on 26th, per Dr Jannifer Franklin. The only time available was 7:30 am on June 27th. She stated her daughter will be with her and will bring her. She verbalized understanding, appreciation.

## 2017-09-01 NOTE — Telephone Encounter (Signed)
The patient will be seen on the 27th at 7 AM, I do not want the patient to lay flat for 24 hours, I want her to lay flat for 3 hours, then get up and resume normal activity, pain attention to her ability to ambulate after the lumbar puncture.

## 2017-09-01 NOTE — Telephone Encounter (Signed)
Pt requesting a call back, stating she is unsure if she should come at the scheduled time. Stating she is having her LP done 6/26 around noon and is suppose to lay flat for 24 hours. Please call to advise

## 2017-09-02 ENCOUNTER — Telehealth: Payer: Self-pay | Admitting: Pharmacist Clinician (PhC)/ Clinical Pharmacy Specialist

## 2017-09-02 NOTE — Telephone Encounter (Signed)
Patient called, needs to cancel BP appt for next week, as she needs to have a spinal tap done.  She would like to get past that problem before worrying about her BP again.    In the meantime, she states she doesn't like taking valsartan, diltiazem and carvedilol all at night as she's having some BP drops.  Valsartan was previously in the mornings, but moved because of morning BP drops to around 950 systolic.    Suggested that she move the valsartan back to mornings, if night is not agreeable to her, and cut the carvedilol dose in half.  She can also take the valsartan at noon if this would be better for her.  She will "play with the doses a little" over the next week or two and reschedule her BP visit once her other issues have resolved.

## 2017-09-02 NOTE — Telephone Encounter (Signed)
Spoke with patient and gave her DR Jannifer Franklin' specific instructions. She stated she was told that she would need to lie flat for 24 hours to avoid a possible headache. This RN acknowledged that is typical protocol, however Dr Jannifer Franklin has his own instructions for her. This RN repeated them again, and she acknowledged understanding and agreement. She confirmed she will be here at 7 am the day following her LP.

## 2017-09-06 ENCOUNTER — Ambulatory Visit: Payer: Medicare Other

## 2017-09-07 ENCOUNTER — Telehealth: Payer: Self-pay | Admitting: *Deleted

## 2017-09-07 ENCOUNTER — Ambulatory Visit
Admission: RE | Admit: 2017-09-07 | Discharge: 2017-09-07 | Disposition: A | Discharge status: Home / Self Care | Payer: Medicare Other | Source: Ambulatory Visit | Attending: Neurology | Admitting: Neurology

## 2017-09-07 DIAGNOSIS — G912 (Idiopathic) normal pressure hydrocephalus: Secondary | ICD-10-CM

## 2017-09-07 DIAGNOSIS — R2689 Other abnormalities of gait and mobility: Secondary | ICD-10-CM | POA: Diagnosis not present

## 2017-09-07 LAB — CSF CELL COUNT WITH DIFFERENTIAL
RBC Count, CSF: 0 cells/uL (ref 0–10)
WBC, CSF: 0 cells/uL (ref 0–5)

## 2017-09-07 LAB — GLUCOSE, CSF: Glucose, CSF: 64 mg/dL (ref 40–80)

## 2017-09-07 LAB — PROTEIN, CSF: Total Protein, CSF: 24 mg/dL (ref 15–60)

## 2017-09-07 NOTE — Telephone Encounter (Signed)
I took call from phone staff. Spoke with Ray from [employer]. He was calling to report stat labs-Glucose CSF, protein CSF. Results not visible in epic yet. He will fax to [phone].

## 2017-09-07 NOTE — Telephone Encounter (Signed)
Dr. Jannifer Franklin- gave you results for your review, thank you  Received results via fax: Glucose, CSF 64 (reference range 40-80mg /dl) and protein, total, csf 24 (reference range 15-60mg /dl).  The cell count and diff, csf still pending.

## 2017-09-07 NOTE — Discharge Instructions (Signed)
Lumbar Puncture Discharge Instructions    Per Dr. Jannifer Franklin: "The patient will be seen on the 27th at 7 AM (actually the 26th at 1230/ J. Valerio Pinard, RN), I do not want the patient to lay flat for 24 hours, I want her to lay flat for 3 hours, then get up and resume normal activity, pain (paying) attention to her ability to ambulate after the lumbar puncture."  1. Get up only to go to the restroom.  You may lie in the bed or on a couch on your back, your stomach, your left side or your right side.  You may have one pillow under your head.  You may have pillows between your knees while you are on your side or under your knees while you are on your back.  2. DO NOT drive today.  Recline the seat as far back as it will go, while still wearing your seat belt, on the way home.  3. You may get up to go to the bathroom as needed.  You may sit up for 10 minutes to eat.  You may resume your normal diet and medications unless otherwise indicated.  Drink plenty of extra fluids today and tomorrow.  4. The incidence of a spinal headache with nausea and/or vomiting is about 5% (one in 20 patients).  If you develop a headache, lie flat and drink plenty of fluids until the headache goes away.  Caffeinated beverages may be helpful.  If you develop severe nausea and vomiting or a headache that does not go away with flat bed rest, please call Dr. Jannifer Franklin, the physician who sent you here.  5. You may resume normal activities after your 3 hours of bed rest is over; however, do not exert yourself strongly or do any heavy lifting.  Please call us at [phone] with any questions or concerns.  6. Call your physician for a follow-up appointment.

## 2017-09-07 NOTE — Telephone Encounter (Signed)
The patient will be seen in office tomorrow for evaluation of normal pressure hydrocephalus.

## 2017-09-08 ENCOUNTER — Encounter: Payer: Self-pay | Admitting: Neurology

## 2017-09-08 ENCOUNTER — Ambulatory Visit (INDEPENDENT_AMBULATORY_CARE_PROVIDER_SITE_OTHER): Payer: Medicare Other | Admitting: Neurology

## 2017-09-08 DIAGNOSIS — R269 Unspecified abnormalities of gait and mobility: Secondary | ICD-10-CM

## 2017-09-08 HISTORY — DX: Unspecified abnormalities of gait and mobility: R26.9

## 2017-09-08 NOTE — Progress Notes (Signed)
Placed letter in mail to pt that Dr. Jannifer Franklin wrote dated 09/08/17.

## 2017-09-08 NOTE — Progress Notes (Signed)
Reason for visit: Possible normal pressure hydrocephalus  [name] is an 84 y.o. female  History of present illness:  [name] is an 84 year old right-handed white female with a history of a gait disorder associated with ventriculomegaly.  The patient has had bilateral total knee replacements and a total hip replacement.  The daughter comes with the patient today, she indicates that over the last 5 to 6 years since the hip replacement she has not had a normal gait.  The patient has had problems with hip dislocations since the surgery, her walking has been wide-based and unsteady.  The walking has worsened over time, the patient has had a medical history complicated by chronic hyponatremia.  The patient is also been under quite a bit of stress lately, her husband is not in good health.  The patient underwent a lumbar puncture yesterday, she comes in for reevaluation today.  The daughter indicates that her walking did improve after the spinal tap, the patient is not dragging her feet as she was before, she is now walking more normally, her walking is still unsteady.  Her mentation has also improved, but the daughter questions whether or not this could be related to the fact that the patient is less stressed out as the daughter is now with her and helping to manage things.  The patient comes in today for reevaluation.  Past Medical History:  Diagnosis Date  . Anemia    CHRONIC  . Aortic insufficiency    a. 2D echo 08/29/15: EF 55-60%, mild LVH, diastolic dysfunction, elevated LV filling pressure, mild AI, severe LAE, mild RAE, mild TR, dilated descending thoracic aorta just distal to the takeoff of the left subclavian, measuring 4.1 cm, no pericardial effusion.  . Arthritis    OA-SOME BACK AND NECK PAIN,  GOES TO CHIROPRACTOR TWICE A MONTH;  HX OF JOINT REPLACEMENTS   . Cancer ([place])    SKIN CANCERS REMOVED FROM LEGS  . CKD (chronic kidney disease), stage II   . Essential hypertension    . GERD (gastroesophageal reflux disease)   . PSVT (paroxysmal supraventricular tachycardia) ([place])   . Recurrent Pericarditis    RECURRENT   . Thoracic aneurysm    a. Followed by [facility] -last seen in 12/2013 w/ plan for f/u MRA and thoracic surgery f/u in 2 yrs, but patient does not wish to follow any longer.    Past Surgical History:  Procedure Laterality Date  . APPENDECTOMY    . BREAST EXCISIONAL BIOPSY Left 1992   benign  . EXCISION/RELEASE BURSA HIP Left 11/24/2012   Procedure: LEFT HIP BURSECTOMY AND TENDON REPAIR ;  Surgeon: Gearlean Alf, MD;  Location: [facility];  Service: Orthopedics;  Laterality: Left;  . HIP CLOSED REDUCTION Left 12/19/2012   Procedure: CLOSED MANIPULATION HIP;  Surgeon: Mauri Pole, MD;  Location: [facility];  Service: Orthopedics;  Laterality: Left;  . HIP CLOSED REDUCTION Left 12/05/2013   Procedure: CLOSED MANIPULATION HIP;  Surgeon: Augustin Schooling, MD;  Location: [facility];  Service: Orthopedics;  Laterality: Left;  . HIP CLOSED REDUCTION Left 09/22/2015   Procedure: CLOSED REDUCTION HIP;  Surgeon: Melina Schools, MD;  Location: [facility];  Service: Orthopedics;  Laterality: Left;  . JOINT REPLACEMENT     LEFT TOTAL HIP REPLACEMENT AND REVISIONS X 2  . OOPHORECTOMY     has a partial of one ovary remaingin  . PELVIC LAPAROSCOPY     ovarian cyst removal,   . REVISION  TOTAL HIP ARTHROPLASTY     left  . right Achilles Tendon repair  5/13  . TOTAL KNEE ARTHROPLASTY     right  . TOTAL KNEE ARTHROPLASTY     left  . VAGINAL HYSTERECTOMY     with ovarian cyst removal    Family History  Problem Relation Age of Onset  . Hypertension Mother   . Stroke Mother   . Heart attack Father 54    Social history:  reports that she has quit smoking. Her smoking use included cigarettes. She has never used smokeless tobacco. She reports that she does not drink alcohol or use drugs.    Allergies  Allergen Reactions  . Keflex [Cephalexin] Other (See Comments)    Pt  states this causes eye problems and STROKE-LIKE SYMPTOMS!!  . Shellfish Allergy Hives and Rash  . Shellfish-Derived Products Hives and Rash  . Clindamycin/Lincomycin Rash  . Lincomycin Rash  . Sugar-Protein-Starch Other (See Comments)    Sugary foods cause legs to cramp  . Tape Other (See Comments)    Please use paper tape or Coban Wrap; patient's skin tears VERY easily!!  . Ciprofloxacin Rash  . Sulfa Antibiotics Hives and Rash  . Sulfites Hives and Rash    Medications:  Prior to Admission medications   Medication Sig Start Date End Date Taking? Authorizing Provider  aspirin 81 MG tablet Take 81 mg by mouth daily.   Yes [provider]  Calcium Carbonate-Vit D-Min (CALCIUM 1200 PO) Take 1 tablet by mouth daily.    Yes [provider]  carboxymethylcellulose (REFRESH TEARS) 0.5 % SOLN Place 1 drop into both eyes daily.    Yes [provider]  carvedilol (COREG) 12.5 MG tablet Take 1 tablet (12.5 mg total) by mouth daily. 08/23/17  Yes Belva Crome, MD  cholecalciferol (VITAMIN D) 1000 UNITS tablet Take 1,000 Units by mouth daily.   Yes [provider]  clobetasol cream (TEMOVATE) 9.02 % Apply 1 application topically 2 (two) times daily as needed (itching).  08/08/13  Yes [provider]  diltiazem (CARDIZEM CD) 240 MG 24 hr capsule Take 1 capsule (240 mg total) by mouth every evening. 02/14/17  Yes Belva Crome, MD  estrogens, conjugated, (PREMARIN) 0.3 MG tablet Take 1 tablet (0.3 mg total) by mouth daily. 08/23/16  Yes Fontaine, Belinda Block, MD  HYDROcodone-acetaminophen (NORCO/VICODIN) 5-325 MG tablet Take 1 tablet every 6 (six) hours as needed by mouth for moderate pain. 01/22/17  Yes Jola Schmidt, MD  meloxicam (MOBIC) 7.5 MG tablet Take 7.5 mg by mouth daily as needed for pain.  03/21/15  Yes [provider]  mupirocin ointment (BACTROBAN) 2 % Apply 1 application topically 2 (two) times daily. Patient taking differently: Apply 1  application topically 2 (two) times daily as needed (for cuts).  12/08/13  Yes Baker, Zachary H, PA-C  omega-3 acid ethyl esters (LOVAZA) 1 g capsule Take 2 g by mouth daily.   Yes [provider]  valsartan (DIOVAN) 160 MG tablet TAKE 1 TABLET BY MOUTH EVERY DAY 06/07/17  Yes Belva Crome, MD    ROS:  Out of a complete 14 system review of symptoms, the patient complains only of the following symptoms, and all other reviewed systems are negative.  Gait disorder  Blood pressure (!) 159/77, pulse 66, height 5\' 4"  (1.626 m), weight 144 lb (65.3 kg).  Physical Exam  General: The patient is alert and cooperative at the time of the examination.  Skin: No  significant peripheral edema is noted.   Neurologic Exam  Mental status: The patient is alert and oriented x 3 at the time of the examination. The patient has apparent normal recent and remote memory, with an apparently normal attention span and concentration ability.   Cranial nerves: Facial symmetry is present. Speech is normal, no aphasia or dysarthria is noted. Extraocular movements are full. Visual fields are full.  Gait and station: The patient has a wide-based, unsteady gait.  Tandem gait was not attempted.  The patient did 6 laps of 20 feet and 45 seconds.  The ability to turn appears to be slightly unsteady.   Assessment/Plan:  1.  Possible normal pressure hydrocephalus  2.  Gait disorder  The patient appears to have had improvement in her walking to some degree following the spinal tap, the time of walking has not improved much, but her stability with turns has improved and her mentation has improved.  The daughter confirms this.  There are other factors however, the patient has had low sodium levels, she has been under stress, she has had bilateral total knee replacements and a hip replacement that have left her with a gait disorder that has been present for several years.  We have discussed the possible referral to  neurosurgery, the patient wants to see how things go over the next several days, if her ability to [facility] and ambulate worsened after several days following the spinal tap, this may also be useful information that the spinal tap was helpful.  She will follow-up for next scheduled visit in September.  Jill Alexanders MD 09/08/2017 7:27 AM  [facility] [address] [place], [place] [address]  Phone [phone] Fax [phone]

## 2017-09-12 ENCOUNTER — Ambulatory Visit: Payer: Self-pay | Admitting: Neurology

## 2017-09-12 ENCOUNTER — Other Ambulatory Visit: Payer: Self-pay | Admitting: Internal Medicine

## 2017-09-12 ENCOUNTER — Telehealth: Payer: Self-pay | Admitting: Neurology

## 2017-09-12 DIAGNOSIS — Z1231 Encounter for screening mammogram for malignant neoplasm of breast: Secondary | ICD-10-CM

## 2017-09-12 NOTE — Telephone Encounter (Signed)
I called the patient.  We did not check sodium levels when she was in office recently, this was done through the office of Dr. Inda Merlin.  The patient needs to contact me if she believes that her walking deteriorated again after the spinal fluid analysis was done.  If she wishes to see a neurosurgeon, I will try to get this set up at any time.

## 2017-09-12 NOTE — Telephone Encounter (Addendum)
Pt requesting a call stating she is wanting to make sure her sodium levels are normal. Stating levels were checked last week and needed to know if she will need to come in for an update.

## 2017-09-12 NOTE — Telephone Encounter (Signed)
Dr. Jannifer Franklin- I don't see where we have a updated sodium level. Did you receive this?

## 2017-09-14 DIAGNOSIS — I10 Essential (primary) hypertension: Secondary | ICD-10-CM | POA: Diagnosis not present

## 2017-09-14 DIAGNOSIS — Z79899 Other long term (current) drug therapy: Secondary | ICD-10-CM | POA: Diagnosis not present

## 2017-09-14 DIAGNOSIS — E871 Hypo-osmolality and hyponatremia: Secondary | ICD-10-CM | POA: Diagnosis not present

## 2017-09-16 DIAGNOSIS — E871 Hypo-osmolality and hyponatremia: Secondary | ICD-10-CM | POA: Diagnosis not present

## 2017-09-19 ENCOUNTER — Telehealth: Payer: Self-pay | Admitting: Neurology

## 2017-09-19 DIAGNOSIS — R269 Unspecified abnormalities of gait and mobility: Secondary | ICD-10-CM

## 2017-09-19 NOTE — Telephone Encounter (Signed)
Called and spoke with [name] (on DPR) and advised Dr. Felecia Shelling ok to put in referral to neurosurgery. We will put this in. He verbalized understanding and will let [name] know.  Placed referral in EPIC

## 2017-09-19 NOTE — Telephone Encounter (Signed)
Called daughter back. Advised I will speak with Dr. Felecia Shelling and see if it's ok to put in referral since Dr. Jannifer Franklin out for 2 weeks.  She will have to call Dr. Felipa Eth to have labs sent to our office and neurosurgeon. She has not heard back yet about labs results from Dr. Felipa Eth. She will wait to speak with them at that time to have them forward results.

## 2017-09-19 NOTE — Addendum Note (Signed)
Addended by: Rossie Muskrat L on: 09/19/2017 12:30 PM   Modules accepted: Orders

## 2017-09-19 NOTE — Telephone Encounter (Signed)
Received the following VO from Dr. Felecia Shelling: [name] to place referral to neurosurgeon. He reviewed Dr. Jannifer Franklin' last office note

## 2017-09-19 NOTE — Telephone Encounter (Signed)
Pt daughter [facility] on DPR))[phone] has called asking that Dr Jannifer Franklin makes thes NeuroSurgeon consult appointment, she is asking that the recent blood work from Dr Felipa Eth be requested and also if Dr Jannifer Franklin doesn't already have 2009 brain scan from [facility] that it be requested. Pt daughter has not requested a call back but welcomes one if there are questions on this request

## 2017-09-20 DIAGNOSIS — M9902 Segmental and somatic dysfunction of thoracic region: Secondary | ICD-10-CM | POA: Diagnosis not present

## 2017-09-20 DIAGNOSIS — L299 Pruritus, unspecified: Secondary | ICD-10-CM | POA: Diagnosis not present

## 2017-09-20 DIAGNOSIS — M9903 Segmental and somatic dysfunction of lumbar region: Secondary | ICD-10-CM | POA: Diagnosis not present

## 2017-09-20 DIAGNOSIS — Z85828 Personal history of other malignant neoplasm of skin: Secondary | ICD-10-CM | POA: Diagnosis not present

## 2017-09-20 DIAGNOSIS — L249 Irritant contact dermatitis, unspecified cause: Secondary | ICD-10-CM | POA: Diagnosis not present

## 2017-09-20 DIAGNOSIS — M9901 Segmental and somatic dysfunction of cervical region: Secondary | ICD-10-CM | POA: Diagnosis not present

## 2017-09-22 DIAGNOSIS — R296 Repeated falls: Secondary | ICD-10-CM | POA: Diagnosis not present

## 2017-09-22 DIAGNOSIS — M6281 Muscle weakness (generalized): Secondary | ICD-10-CM | POA: Diagnosis not present

## 2017-09-22 DIAGNOSIS — R2681 Unsteadiness on feet: Secondary | ICD-10-CM | POA: Diagnosis not present

## 2017-09-22 DIAGNOSIS — R2689 Other abnormalities of gait and mobility: Secondary | ICD-10-CM | POA: Diagnosis not present

## 2017-09-26 DIAGNOSIS — M6281 Muscle weakness (generalized): Secondary | ICD-10-CM | POA: Diagnosis not present

## 2017-09-26 DIAGNOSIS — R2689 Other abnormalities of gait and mobility: Secondary | ICD-10-CM | POA: Diagnosis not present

## 2017-09-26 DIAGNOSIS — R296 Repeated falls: Secondary | ICD-10-CM | POA: Diagnosis not present

## 2017-09-26 DIAGNOSIS — R2681 Unsteadiness on feet: Secondary | ICD-10-CM | POA: Diagnosis not present

## 2017-09-27 DIAGNOSIS — E871 Hypo-osmolality and hyponatremia: Secondary | ICD-10-CM | POA: Diagnosis not present

## 2017-09-27 DIAGNOSIS — G9389 Other specified disorders of brain: Secondary | ICD-10-CM | POA: Diagnosis not present

## 2017-09-28 DIAGNOSIS — R296 Repeated falls: Secondary | ICD-10-CM | POA: Diagnosis not present

## 2017-09-28 DIAGNOSIS — M6281 Muscle weakness (generalized): Secondary | ICD-10-CM | POA: Diagnosis not present

## 2017-09-28 DIAGNOSIS — R2681 Unsteadiness on feet: Secondary | ICD-10-CM | POA: Diagnosis not present

## 2017-09-28 DIAGNOSIS — R2689 Other abnormalities of gait and mobility: Secondary | ICD-10-CM | POA: Diagnosis not present

## 2017-09-30 ENCOUNTER — Ambulatory Visit
Admission: RE | Admit: 2017-09-30 | Discharge: 2017-09-30 | Disposition: A | Discharge status: Home / Self Care | Payer: Medicare Other | Source: Ambulatory Visit | Attending: Internal Medicine | Admitting: Internal Medicine

## 2017-09-30 DIAGNOSIS — Z1231 Encounter for screening mammogram for malignant neoplasm of breast: Secondary | ICD-10-CM | POA: Diagnosis not present

## 2017-10-03 DIAGNOSIS — R2689 Other abnormalities of gait and mobility: Secondary | ICD-10-CM | POA: Diagnosis not present

## 2017-10-03 DIAGNOSIS — R2681 Unsteadiness on feet: Secondary | ICD-10-CM | POA: Diagnosis not present

## 2017-10-03 DIAGNOSIS — R296 Repeated falls: Secondary | ICD-10-CM | POA: Diagnosis not present

## 2017-10-03 DIAGNOSIS — M6281 Muscle weakness (generalized): Secondary | ICD-10-CM | POA: Diagnosis not present

## 2017-10-04 DIAGNOSIS — M9903 Segmental and somatic dysfunction of lumbar region: Secondary | ICD-10-CM | POA: Diagnosis not present

## 2017-10-04 DIAGNOSIS — M9901 Segmental and somatic dysfunction of cervical region: Secondary | ICD-10-CM | POA: Diagnosis not present

## 2017-10-04 DIAGNOSIS — M9902 Segmental and somatic dysfunction of thoracic region: Secondary | ICD-10-CM | POA: Diagnosis not present

## 2017-10-05 DIAGNOSIS — R296 Repeated falls: Secondary | ICD-10-CM | POA: Diagnosis not present

## 2017-10-05 DIAGNOSIS — M6281 Muscle weakness (generalized): Secondary | ICD-10-CM | POA: Diagnosis not present

## 2017-10-05 DIAGNOSIS — R2681 Unsteadiness on feet: Secondary | ICD-10-CM | POA: Diagnosis not present

## 2017-10-05 DIAGNOSIS — R2689 Other abnormalities of gait and mobility: Secondary | ICD-10-CM | POA: Diagnosis not present

## 2017-10-06 ENCOUNTER — Ambulatory Visit (INDEPENDENT_AMBULATORY_CARE_PROVIDER_SITE_OTHER): Payer: Medicare Other | Admitting: Gynecology

## 2017-10-06 ENCOUNTER — Encounter: Payer: Self-pay | Admitting: Gynecology

## 2017-10-06 VITALS — BP 122/70 | Ht 64.0 in | Wt 143.0 lb

## 2017-10-06 DIAGNOSIS — Z01419 Encounter for gynecological examination (general) (routine) without abnormal findings: Secondary | ICD-10-CM

## 2017-10-06 DIAGNOSIS — N952 Postmenopausal atrophic vaginitis: Secondary | ICD-10-CM

## 2017-10-06 DIAGNOSIS — Z79890 Hormone replacement therapy (postmenopausal): Secondary | ICD-10-CM

## 2017-10-06 MED ORDER — ESTROGENS CONJUGATED 0.3 MG PO TABS: 0.3000 mg | ORAL_TABLET | Freq: Every day | ORAL | 4 refills | Status: DC

## 2017-10-06 NOTE — Progress Notes (Signed)
    [name] 11/18/1929 [id number]        84 y.o.  G2P2 for breast and pelvic exam.  Past medical history,surgical history, problem list, medications, allergies, family history and social history were all reviewed and documented as reviewed in the EPIC chart.  ROS:  Performed with pertinent positives and negatives included in the history, assessment and plan.   Additional significant findings : None   Exam: Caryn Bee assistant Vitals:   10/06/17 1411  BP: 122/70  Weight: 143 lb (64.9 kg)  Height: 5\' 4"  (1.626 m)   Body mass index is 24.55 kg/m.  General appearance:  Normal affect, orientation and appearance. Skin: Grossly normal HEENT: Without gross lesions.  No cervical or supraclavicular adenopathy. Thyroid normal.  Lungs:  Clear without wheezing, rales or rhonchi Cardiac: RR, without RMG Abdominal:  Soft, nontender, without masses, guarding, rebound, organomegaly or hernia Breasts:  Examined lying and sitting without masses, retractions, discharge or axillary adenopathy. Pelvic:  Ext, BUS, Vagina: With atrophic changes  Adnexa: Without masses or tenderness    Anus and perineum: Normal   Rectovaginal: Normal sphincter tone without palpated masses or tenderness.    Assessment/Plan:  84 y.o. G2P2 female for breast and pelvic exam.   1. Postmenopausal/atrophic genital changes.  Status post TVH in the past.  Continues on Premarin 0.3 mg daily.  We have had the discussion about risks to include a realistic discussion about thrombosis such as stroke heart attack DVT as well as the breast cancer issue.  The patient feels well and at this point "does not want to rock the boat".  Refill x1 year provided. 2. Pap smear 2012.  No Pap smear done today.  We both agree to stop screening per current screening guidelines. 3. Mammography 09/2017.  Continue with annual mammography next year.  Breast exam normal today. 4. DEXA 2016 normal.  Plan repeat DEXA at 5-year  interval. 5. Colonoscopy 10 years ago.  Will follow-up with her primary physician for recommendations for colon screening. 6. Health maintenance.  No routine lab work done as patient does this elsewhere.  Follow-up 1 year, sooner as needed.   Anastasio Auerbach MD, 2:44 PM 10/06/2017

## 2017-10-06 NOTE — Patient Instructions (Signed)
Follow-up in 1 year for annual exam 

## 2017-10-11 DIAGNOSIS — M9902 Segmental and somatic dysfunction of thoracic region: Secondary | ICD-10-CM | POA: Diagnosis not present

## 2017-10-11 DIAGNOSIS — R2689 Other abnormalities of gait and mobility: Secondary | ICD-10-CM | POA: Diagnosis not present

## 2017-10-11 DIAGNOSIS — E871 Hypo-osmolality and hyponatremia: Secondary | ICD-10-CM | POA: Diagnosis not present

## 2017-10-11 DIAGNOSIS — M9901 Segmental and somatic dysfunction of cervical region: Secondary | ICD-10-CM | POA: Diagnosis not present

## 2017-10-11 DIAGNOSIS — R296 Repeated falls: Secondary | ICD-10-CM | POA: Diagnosis not present

## 2017-10-11 DIAGNOSIS — M9903 Segmental and somatic dysfunction of lumbar region: Secondary | ICD-10-CM | POA: Diagnosis not present

## 2017-10-11 DIAGNOSIS — R269 Unspecified abnormalities of gait and mobility: Secondary | ICD-10-CM | POA: Diagnosis not present

## 2017-10-11 DIAGNOSIS — R2681 Unsteadiness on feet: Secondary | ICD-10-CM | POA: Diagnosis not present

## 2017-10-11 DIAGNOSIS — M6281 Muscle weakness (generalized): Secondary | ICD-10-CM | POA: Diagnosis not present

## 2017-10-13 DIAGNOSIS — M6281 Muscle weakness (generalized): Secondary | ICD-10-CM | POA: Diagnosis not present

## 2017-10-13 DIAGNOSIS — R296 Repeated falls: Secondary | ICD-10-CM | POA: Diagnosis not present

## 2017-10-13 DIAGNOSIS — R2681 Unsteadiness on feet: Secondary | ICD-10-CM | POA: Diagnosis not present

## 2017-10-13 DIAGNOSIS — R2689 Other abnormalities of gait and mobility: Secondary | ICD-10-CM | POA: Diagnosis not present

## 2017-10-18 DIAGNOSIS — M47812 Spondylosis without myelopathy or radiculopathy, cervical region: Secondary | ICD-10-CM | POA: Diagnosis not present

## 2017-10-18 DIAGNOSIS — R2689 Other abnormalities of gait and mobility: Secondary | ICD-10-CM | POA: Diagnosis not present

## 2017-10-18 DIAGNOSIS — M9901 Segmental and somatic dysfunction of cervical region: Secondary | ICD-10-CM | POA: Diagnosis not present

## 2017-10-18 DIAGNOSIS — M6281 Muscle weakness (generalized): Secondary | ICD-10-CM | POA: Diagnosis not present

## 2017-10-18 DIAGNOSIS — R296 Repeated falls: Secondary | ICD-10-CM | POA: Diagnosis not present

## 2017-10-18 DIAGNOSIS — R2681 Unsteadiness on feet: Secondary | ICD-10-CM | POA: Diagnosis not present

## 2017-10-19 DIAGNOSIS — G9389 Other specified disorders of brain: Secondary | ICD-10-CM | POA: Diagnosis not present

## 2017-10-19 DIAGNOSIS — I10 Essential (primary) hypertension: Secondary | ICD-10-CM | POA: Diagnosis not present

## 2017-10-20 DIAGNOSIS — R2681 Unsteadiness on feet: Secondary | ICD-10-CM | POA: Diagnosis not present

## 2017-10-20 DIAGNOSIS — R2689 Other abnormalities of gait and mobility: Secondary | ICD-10-CM | POA: Diagnosis not present

## 2017-10-20 DIAGNOSIS — M47812 Spondylosis without myelopathy or radiculopathy, cervical region: Secondary | ICD-10-CM | POA: Diagnosis not present

## 2017-10-20 DIAGNOSIS — M6281 Muscle weakness (generalized): Secondary | ICD-10-CM | POA: Diagnosis not present

## 2017-10-20 DIAGNOSIS — M9901 Segmental and somatic dysfunction of cervical region: Secondary | ICD-10-CM | POA: Diagnosis not present

## 2017-10-20 DIAGNOSIS — R296 Repeated falls: Secondary | ICD-10-CM | POA: Diagnosis not present

## 2017-10-21 DIAGNOSIS — M6740 Ganglion, unspecified site: Secondary | ICD-10-CM | POA: Diagnosis not present

## 2017-10-21 DIAGNOSIS — Z85828 Personal history of other malignant neoplasm of skin: Secondary | ICD-10-CM | POA: Diagnosis not present

## 2017-10-21 DIAGNOSIS — L239 Allergic contact dermatitis, unspecified cause: Secondary | ICD-10-CM | POA: Diagnosis not present

## 2017-10-21 DIAGNOSIS — N3020 Other chronic cystitis without hematuria: Secondary | ICD-10-CM | POA: Diagnosis not present

## 2017-10-24 DIAGNOSIS — M47812 Spondylosis without myelopathy or radiculopathy, cervical region: Secondary | ICD-10-CM | POA: Diagnosis not present

## 2017-10-24 DIAGNOSIS — M9901 Segmental and somatic dysfunction of cervical region: Secondary | ICD-10-CM | POA: Diagnosis not present

## 2017-10-25 DIAGNOSIS — R2681 Unsteadiness on feet: Secondary | ICD-10-CM | POA: Diagnosis not present

## 2017-10-25 DIAGNOSIS — R2689 Other abnormalities of gait and mobility: Secondary | ICD-10-CM | POA: Diagnosis not present

## 2017-10-25 DIAGNOSIS — R296 Repeated falls: Secondary | ICD-10-CM | POA: Diagnosis not present

## 2017-10-25 DIAGNOSIS — M6281 Muscle weakness (generalized): Secondary | ICD-10-CM | POA: Diagnosis not present

## 2017-10-26 ENCOUNTER — Telehealth: Payer: Self-pay | Admitting: Neurology

## 2017-10-26 NOTE — Telephone Encounter (Signed)
Error

## 2017-10-27 DIAGNOSIS — E871 Hypo-osmolality and hyponatremia: Secondary | ICD-10-CM | POA: Diagnosis not present

## 2017-10-27 DIAGNOSIS — M47812 Spondylosis without myelopathy or radiculopathy, cervical region: Secondary | ICD-10-CM | POA: Diagnosis not present

## 2017-10-27 DIAGNOSIS — M9901 Segmental and somatic dysfunction of cervical region: Secondary | ICD-10-CM | POA: Diagnosis not present

## 2017-10-28 DIAGNOSIS — M6281 Muscle weakness (generalized): Secondary | ICD-10-CM | POA: Diagnosis not present

## 2017-10-28 DIAGNOSIS — R2681 Unsteadiness on feet: Secondary | ICD-10-CM | POA: Diagnosis not present

## 2017-10-28 DIAGNOSIS — R296 Repeated falls: Secondary | ICD-10-CM | POA: Diagnosis not present

## 2017-10-28 DIAGNOSIS — R2689 Other abnormalities of gait and mobility: Secondary | ICD-10-CM | POA: Diagnosis not present

## 2017-10-31 ENCOUNTER — Ambulatory Visit: Payer: Medicare Other | Admitting: Neurology

## 2017-10-31 DIAGNOSIS — M6281 Muscle weakness (generalized): Secondary | ICD-10-CM | POA: Diagnosis not present

## 2017-10-31 DIAGNOSIS — R2689 Other abnormalities of gait and mobility: Secondary | ICD-10-CM | POA: Diagnosis not present

## 2017-10-31 DIAGNOSIS — R2681 Unsteadiness on feet: Secondary | ICD-10-CM | POA: Diagnosis not present

## 2017-10-31 DIAGNOSIS — M9901 Segmental and somatic dysfunction of cervical region: Secondary | ICD-10-CM | POA: Diagnosis not present

## 2017-10-31 DIAGNOSIS — M47812 Spondylosis without myelopathy or radiculopathy, cervical region: Secondary | ICD-10-CM | POA: Diagnosis not present

## 2017-10-31 DIAGNOSIS — R296 Repeated falls: Secondary | ICD-10-CM | POA: Diagnosis not present

## 2017-11-02 ENCOUNTER — Ambulatory Visit: Payer: Medicare Other | Admitting: Neurology

## 2017-11-02 DIAGNOSIS — M6281 Muscle weakness (generalized): Secondary | ICD-10-CM | POA: Diagnosis not present

## 2017-11-02 DIAGNOSIS — R2689 Other abnormalities of gait and mobility: Secondary | ICD-10-CM | POA: Diagnosis not present

## 2017-11-02 DIAGNOSIS — R296 Repeated falls: Secondary | ICD-10-CM | POA: Diagnosis not present

## 2017-11-02 DIAGNOSIS — R2681 Unsteadiness on feet: Secondary | ICD-10-CM | POA: Diagnosis not present

## 2017-11-03 DIAGNOSIS — E871 Hypo-osmolality and hyponatremia: Secondary | ICD-10-CM | POA: Diagnosis not present

## 2017-11-03 DIAGNOSIS — M47812 Spondylosis without myelopathy or radiculopathy, cervical region: Secondary | ICD-10-CM | POA: Diagnosis not present

## 2017-11-03 DIAGNOSIS — M9901 Segmental and somatic dysfunction of cervical region: Secondary | ICD-10-CM | POA: Diagnosis not present

## 2017-11-07 DIAGNOSIS — M47812 Spondylosis without myelopathy or radiculopathy, cervical region: Secondary | ICD-10-CM | POA: Diagnosis not present

## 2017-11-07 DIAGNOSIS — R296 Repeated falls: Secondary | ICD-10-CM | POA: Diagnosis not present

## 2017-11-07 DIAGNOSIS — M6281 Muscle weakness (generalized): Secondary | ICD-10-CM | POA: Diagnosis not present

## 2017-11-07 DIAGNOSIS — R2689 Other abnormalities of gait and mobility: Secondary | ICD-10-CM | POA: Diagnosis not present

## 2017-11-07 DIAGNOSIS — M9901 Segmental and somatic dysfunction of cervical region: Secondary | ICD-10-CM | POA: Diagnosis not present

## 2017-11-07 DIAGNOSIS — R2681 Unsteadiness on feet: Secondary | ICD-10-CM | POA: Diagnosis not present

## 2017-11-09 DIAGNOSIS — M6281 Muscle weakness (generalized): Secondary | ICD-10-CM | POA: Diagnosis not present

## 2017-11-09 DIAGNOSIS — R2681 Unsteadiness on feet: Secondary | ICD-10-CM | POA: Diagnosis not present

## 2017-11-09 DIAGNOSIS — R296 Repeated falls: Secondary | ICD-10-CM | POA: Diagnosis not present

## 2017-11-09 DIAGNOSIS — R2689 Other abnormalities of gait and mobility: Secondary | ICD-10-CM | POA: Diagnosis not present

## 2017-11-10 DIAGNOSIS — M9901 Segmental and somatic dysfunction of cervical region: Secondary | ICD-10-CM | POA: Diagnosis not present

## 2017-11-10 DIAGNOSIS — M47812 Spondylosis without myelopathy or radiculopathy, cervical region: Secondary | ICD-10-CM | POA: Diagnosis not present

## 2017-11-15 DIAGNOSIS — R2689 Other abnormalities of gait and mobility: Secondary | ICD-10-CM | POA: Diagnosis not present

## 2017-11-15 DIAGNOSIS — R2681 Unsteadiness on feet: Secondary | ICD-10-CM | POA: Diagnosis not present

## 2017-11-15 DIAGNOSIS — M6281 Muscle weakness (generalized): Secondary | ICD-10-CM | POA: Diagnosis not present

## 2017-11-15 DIAGNOSIS — M47812 Spondylosis without myelopathy or radiculopathy, cervical region: Secondary | ICD-10-CM | POA: Diagnosis not present

## 2017-11-15 DIAGNOSIS — M9901 Segmental and somatic dysfunction of cervical region: Secondary | ICD-10-CM | POA: Diagnosis not present

## 2017-11-15 DIAGNOSIS — R296 Repeated falls: Secondary | ICD-10-CM | POA: Diagnosis not present

## 2017-11-16 DIAGNOSIS — M6281 Muscle weakness (generalized): Secondary | ICD-10-CM | POA: Diagnosis not present

## 2017-11-16 DIAGNOSIS — R296 Repeated falls: Secondary | ICD-10-CM | POA: Diagnosis not present

## 2017-11-16 DIAGNOSIS — R2681 Unsteadiness on feet: Secondary | ICD-10-CM | POA: Diagnosis not present

## 2017-11-16 DIAGNOSIS — M47812 Spondylosis without myelopathy or radiculopathy, cervical region: Secondary | ICD-10-CM | POA: Diagnosis not present

## 2017-11-16 DIAGNOSIS — R2689 Other abnormalities of gait and mobility: Secondary | ICD-10-CM | POA: Diagnosis not present

## 2017-11-16 DIAGNOSIS — M9901 Segmental and somatic dysfunction of cervical region: Secondary | ICD-10-CM | POA: Diagnosis not present

## 2017-11-17 DIAGNOSIS — H43813 Vitreous degeneration, bilateral: Secondary | ICD-10-CM | POA: Diagnosis not present

## 2017-11-17 DIAGNOSIS — Z961 Presence of intraocular lens: Secondary | ICD-10-CM | POA: Diagnosis not present

## 2017-11-17 DIAGNOSIS — H353132 Nonexudative age-related macular degeneration, bilateral, intermediate dry stage: Secondary | ICD-10-CM | POA: Diagnosis not present

## 2017-11-17 DIAGNOSIS — H26491 Other secondary cataract, right eye: Secondary | ICD-10-CM | POA: Diagnosis not present

## 2017-11-18 DIAGNOSIS — Z23 Encounter for immunization: Secondary | ICD-10-CM | POA: Diagnosis not present

## 2017-11-18 DIAGNOSIS — E871 Hypo-osmolality and hyponatremia: Secondary | ICD-10-CM | POA: Diagnosis not present

## 2017-11-21 DIAGNOSIS — M9901 Segmental and somatic dysfunction of cervical region: Secondary | ICD-10-CM | POA: Diagnosis not present

## 2017-11-21 DIAGNOSIS — M47812 Spondylosis without myelopathy or radiculopathy, cervical region: Secondary | ICD-10-CM | POA: Diagnosis not present

## 2017-11-23 DIAGNOSIS — M47812 Spondylosis without myelopathy or radiculopathy, cervical region: Secondary | ICD-10-CM | POA: Diagnosis not present

## 2017-11-23 DIAGNOSIS — M9901 Segmental and somatic dysfunction of cervical region: Secondary | ICD-10-CM | POA: Diagnosis not present

## 2017-11-24 ENCOUNTER — Telehealth: Payer: Self-pay | Admitting: Neurology

## 2017-11-24 NOTE — Telephone Encounter (Signed)
Pt has seen Dr Seleta Rhymes and he said she does not need surgery. She is wanting clarification if she needs to keep the appt on 9/17 with Dr Jannifer Franklin. Please call to advise

## 2017-11-24 NOTE — Telephone Encounter (Signed)
I called the patient.  The patient believes that she has done better with her mentation, she believes that her walking is stable, she was seen by Dr. Kathyrn Sheriff, it was felt that surgery was not needed.  The patient can follow-up in office, I will keep tabs on her ability to ambulate.

## 2017-11-28 DIAGNOSIS — M47812 Spondylosis without myelopathy or radiculopathy, cervical region: Secondary | ICD-10-CM | POA: Diagnosis not present

## 2017-11-28 DIAGNOSIS — M9901 Segmental and somatic dysfunction of cervical region: Secondary | ICD-10-CM | POA: Diagnosis not present

## 2017-11-29 ENCOUNTER — Ambulatory Visit: Payer: Medicare Other | Admitting: Neurology

## 2017-11-30 DIAGNOSIS — Z85828 Personal history of other malignant neoplasm of skin: Secondary | ICD-10-CM | POA: Diagnosis not present

## 2017-11-30 DIAGNOSIS — I788 Other diseases of capillaries: Secondary | ICD-10-CM | POA: Diagnosis not present

## 2017-11-30 DIAGNOSIS — M47812 Spondylosis without myelopathy or radiculopathy, cervical region: Secondary | ICD-10-CM | POA: Diagnosis not present

## 2017-11-30 DIAGNOSIS — L300 Nummular dermatitis: Secondary | ICD-10-CM | POA: Diagnosis not present

## 2017-11-30 DIAGNOSIS — L570 Actinic keratosis: Secondary | ICD-10-CM | POA: Diagnosis not present

## 2017-11-30 DIAGNOSIS — M9901 Segmental and somatic dysfunction of cervical region: Secondary | ICD-10-CM | POA: Diagnosis not present

## 2017-12-05 DIAGNOSIS — M47812 Spondylosis without myelopathy or radiculopathy, cervical region: Secondary | ICD-10-CM | POA: Diagnosis not present

## 2017-12-05 DIAGNOSIS — M9901 Segmental and somatic dysfunction of cervical region: Secondary | ICD-10-CM | POA: Diagnosis not present

## 2017-12-09 DIAGNOSIS — I10 Essential (primary) hypertension: Secondary | ICD-10-CM | POA: Diagnosis not present

## 2017-12-09 DIAGNOSIS — E871 Hypo-osmolality and hyponatremia: Secondary | ICD-10-CM | POA: Diagnosis not present

## 2017-12-09 DIAGNOSIS — Z1389 Encounter for screening for other disorder: Secondary | ICD-10-CM | POA: Diagnosis not present

## 2017-12-09 DIAGNOSIS — F325 Major depressive disorder, single episode, in full remission: Secondary | ICD-10-CM | POA: Diagnosis not present

## 2017-12-09 DIAGNOSIS — Z0000 Encounter for general adult medical examination without abnormal findings: Secondary | ICD-10-CM | POA: Diagnosis not present

## 2017-12-12 DIAGNOSIS — M9901 Segmental and somatic dysfunction of cervical region: Secondary | ICD-10-CM | POA: Diagnosis not present

## 2017-12-12 DIAGNOSIS — M47812 Spondylosis without myelopathy or radiculopathy, cervical region: Secondary | ICD-10-CM | POA: Diagnosis not present

## 2017-12-14 DIAGNOSIS — M47812 Spondylosis without myelopathy or radiculopathy, cervical region: Secondary | ICD-10-CM | POA: Diagnosis not present

## 2017-12-14 DIAGNOSIS — M9901 Segmental and somatic dysfunction of cervical region: Secondary | ICD-10-CM | POA: Diagnosis not present

## 2017-12-19 DIAGNOSIS — M47812 Spondylosis without myelopathy or radiculopathy, cervical region: Secondary | ICD-10-CM | POA: Diagnosis not present

## 2017-12-19 DIAGNOSIS — M9901 Segmental and somatic dysfunction of cervical region: Secondary | ICD-10-CM | POA: Diagnosis not present

## 2017-12-21 DIAGNOSIS — M47812 Spondylosis without myelopathy or radiculopathy, cervical region: Secondary | ICD-10-CM | POA: Diagnosis not present

## 2017-12-21 DIAGNOSIS — M9901 Segmental and somatic dysfunction of cervical region: Secondary | ICD-10-CM | POA: Diagnosis not present

## 2017-12-26 DIAGNOSIS — M9901 Segmental and somatic dysfunction of cervical region: Secondary | ICD-10-CM | POA: Diagnosis not present

## 2017-12-26 DIAGNOSIS — M47812 Spondylosis without myelopathy or radiculopathy, cervical region: Secondary | ICD-10-CM | POA: Diagnosis not present

## 2017-12-29 DIAGNOSIS — M9901 Segmental and somatic dysfunction of cervical region: Secondary | ICD-10-CM | POA: Diagnosis not present

## 2017-12-29 DIAGNOSIS — M47812 Spondylosis without myelopathy or radiculopathy, cervical region: Secondary | ICD-10-CM | POA: Diagnosis not present

## 2018-01-17 DIAGNOSIS — Z5320 Procedure and treatment not carried out because of patient's decision for unspecified reasons: Secondary | ICD-10-CM | POA: Diagnosis present

## 2018-01-17 DIAGNOSIS — T84021A Dislocation of internal left hip prosthesis, initial encounter: Secondary | ICD-10-CM | POA: Diagnosis not present

## 2018-01-17 DIAGNOSIS — S79912A Unspecified injury of left hip, initial encounter: Secondary | ICD-10-CM | POA: Diagnosis not present

## 2018-01-17 DIAGNOSIS — D649 Anemia, unspecified: Secondary | ICD-10-CM | POA: Diagnosis not present

## 2018-01-17 DIAGNOSIS — R197 Diarrhea, unspecified: Secondary | ICD-10-CM | POA: Diagnosis not present

## 2018-01-17 DIAGNOSIS — Z96642 Presence of left artificial hip joint: Secondary | ICD-10-CM | POA: Diagnosis present

## 2018-01-17 DIAGNOSIS — G8929 Other chronic pain: Secondary | ICD-10-CM | POA: Diagnosis not present

## 2018-01-17 DIAGNOSIS — R9082 White matter disease, unspecified: Secondary | ICD-10-CM | POA: Diagnosis not present

## 2018-01-17 DIAGNOSIS — E871 Hypo-osmolality and hyponatremia: Secondary | ICD-10-CM | POA: Diagnosis not present

## 2018-01-17 DIAGNOSIS — I129 Hypertensive chronic kidney disease with stage 1 through stage 4 chronic kidney disease, or unspecified chronic kidney disease: Secondary | ICD-10-CM | POA: Diagnosis not present

## 2018-01-17 DIAGNOSIS — R531 Weakness: Secondary | ICD-10-CM | POA: Diagnosis not present

## 2018-01-17 DIAGNOSIS — Z87891 Personal history of nicotine dependence: Secondary | ICD-10-CM | POA: Diagnosis not present

## 2018-01-17 DIAGNOSIS — Z885 Allergy status to narcotic agent status: Secondary | ICD-10-CM | POA: Diagnosis not present

## 2018-01-17 DIAGNOSIS — Z79899 Other long term (current) drug therapy: Secondary | ICD-10-CM | POA: Diagnosis not present

## 2018-01-17 DIAGNOSIS — I712 Thoracic aortic aneurysm, without rupture: Secondary | ICD-10-CM | POA: Diagnosis not present

## 2018-01-17 DIAGNOSIS — T447X1A Poisoning by beta-adrenoreceptor antagonists, accidental (unintentional), initial encounter: Secondary | ICD-10-CM | POA: Diagnosis not present

## 2018-01-17 DIAGNOSIS — Z90710 Acquired absence of both cervix and uterus: Secondary | ICD-10-CM | POA: Diagnosis not present

## 2018-01-17 DIAGNOSIS — R55 Syncope and collapse: Secondary | ICD-10-CM | POA: Diagnosis not present

## 2018-01-17 DIAGNOSIS — M25559 Pain in unspecified hip: Secondary | ICD-10-CM | POA: Diagnosis not present

## 2018-01-17 DIAGNOSIS — J8410 Pulmonary fibrosis, unspecified: Secondary | ICD-10-CM | POA: Diagnosis not present

## 2018-01-17 DIAGNOSIS — I10 Essential (primary) hypertension: Secondary | ICD-10-CM | POA: Diagnosis not present

## 2018-01-17 DIAGNOSIS — M24452 Recurrent dislocation, left hip: Secondary | ICD-10-CM | POA: Diagnosis not present

## 2018-01-17 DIAGNOSIS — Z96653 Presence of artificial knee joint, bilateral: Secondary | ICD-10-CM | POA: Diagnosis present

## 2018-01-17 DIAGNOSIS — Z882 Allergy status to sulfonamides status: Secondary | ICD-10-CM | POA: Diagnosis not present

## 2018-01-17 DIAGNOSIS — Z8739 Personal history of other diseases of the musculoskeletal system and connective tissue: Secondary | ICD-10-CM | POA: Diagnosis not present

## 2018-01-17 DIAGNOSIS — R9431 Abnormal electrocardiogram [ECG] [EKG]: Secondary | ICD-10-CM | POA: Diagnosis not present

## 2018-01-17 DIAGNOSIS — Z66 Do not resuscitate: Secondary | ICD-10-CM | POA: Diagnosis present

## 2018-01-17 DIAGNOSIS — I959 Hypotension, unspecified: Secondary | ICD-10-CM | POA: Diagnosis not present

## 2018-01-17 DIAGNOSIS — Z9181 History of falling: Secondary | ICD-10-CM | POA: Diagnosis not present

## 2018-01-17 DIAGNOSIS — S73005A Unspecified dislocation of left hip, initial encounter: Secondary | ICD-10-CM | POA: Diagnosis not present

## 2018-01-17 DIAGNOSIS — I351 Nonrheumatic aortic (valve) insufficiency: Secondary | ICD-10-CM | POA: Diagnosis not present

## 2018-01-17 DIAGNOSIS — F419 Anxiety disorder, unspecified: Secondary | ICD-10-CM | POA: Diagnosis not present

## 2018-01-17 DIAGNOSIS — Z888 Allergy status to other drugs, medicaments and biological substances status: Secondary | ICD-10-CM | POA: Diagnosis not present

## 2018-01-17 DIAGNOSIS — I952 Hypotension due to drugs: Secondary | ICD-10-CM | POA: Diagnosis not present

## 2018-01-17 DIAGNOSIS — R931 Abnormal findings on diagnostic imaging of heart and coronary circulation: Secondary | ICD-10-CM | POA: Diagnosis not present

## 2018-01-17 DIAGNOSIS — N183 Chronic kidney disease, stage 3 (moderate): Secondary | ICD-10-CM | POA: Diagnosis not present

## 2018-01-17 DIAGNOSIS — Z7982 Long term (current) use of aspirin: Secondary | ICD-10-CM | POA: Diagnosis not present

## 2018-01-18 DIAGNOSIS — R55 Syncope and collapse: Secondary | ICD-10-CM | POA: Diagnosis not present

## 2018-01-20 DIAGNOSIS — R55 Syncope and collapse: Secondary | ICD-10-CM | POA: Diagnosis not present

## 2018-01-24 DIAGNOSIS — M9903 Segmental and somatic dysfunction of lumbar region: Secondary | ICD-10-CM | POA: Diagnosis not present

## 2018-01-24 DIAGNOSIS — M9901 Segmental and somatic dysfunction of cervical region: Secondary | ICD-10-CM | POA: Diagnosis not present

## 2018-01-24 DIAGNOSIS — M9902 Segmental and somatic dysfunction of thoracic region: Secondary | ICD-10-CM | POA: Diagnosis not present

## 2018-01-25 DIAGNOSIS — M6281 Muscle weakness (generalized): Secondary | ICD-10-CM | POA: Diagnosis not present

## 2018-01-25 DIAGNOSIS — Z9181 History of falling: Secondary | ICD-10-CM | POA: Diagnosis not present

## 2018-01-25 DIAGNOSIS — M24459 Recurrent dislocation, unspecified hip: Secondary | ICD-10-CM | POA: Diagnosis not present

## 2018-01-25 DIAGNOSIS — M542 Cervicalgia: Secondary | ICD-10-CM | POA: Diagnosis not present

## 2018-01-25 DIAGNOSIS — R279 Unspecified lack of coordination: Secondary | ICD-10-CM | POA: Diagnosis not present

## 2018-01-25 DIAGNOSIS — R262 Difficulty in walking, not elsewhere classified: Secondary | ICD-10-CM | POA: Diagnosis not present

## 2018-01-27 DIAGNOSIS — R262 Difficulty in walking, not elsewhere classified: Secondary | ICD-10-CM | POA: Diagnosis not present

## 2018-01-27 DIAGNOSIS — M6281 Muscle weakness (generalized): Secondary | ICD-10-CM | POA: Diagnosis not present

## 2018-01-27 DIAGNOSIS — Z9181 History of falling: Secondary | ICD-10-CM | POA: Diagnosis not present

## 2018-01-27 DIAGNOSIS — M542 Cervicalgia: Secondary | ICD-10-CM | POA: Diagnosis not present

## 2018-01-27 DIAGNOSIS — R279 Unspecified lack of coordination: Secondary | ICD-10-CM | POA: Diagnosis not present

## 2018-01-27 DIAGNOSIS — M24459 Recurrent dislocation, unspecified hip: Secondary | ICD-10-CM | POA: Diagnosis not present

## 2018-01-30 ENCOUNTER — Other Ambulatory Visit: Payer: Self-pay | Admitting: Interventional Cardiology

## 2018-01-30 DIAGNOSIS — M6281 Muscle weakness (generalized): Secondary | ICD-10-CM | POA: Diagnosis not present

## 2018-01-30 DIAGNOSIS — Z9181 History of falling: Secondary | ICD-10-CM | POA: Diagnosis not present

## 2018-01-30 DIAGNOSIS — R279 Unspecified lack of coordination: Secondary | ICD-10-CM | POA: Diagnosis not present

## 2018-01-30 DIAGNOSIS — R262 Difficulty in walking, not elsewhere classified: Secondary | ICD-10-CM | POA: Diagnosis not present

## 2018-01-30 DIAGNOSIS — M542 Cervicalgia: Secondary | ICD-10-CM | POA: Diagnosis not present

## 2018-01-30 DIAGNOSIS — M24459 Recurrent dislocation, unspecified hip: Secondary | ICD-10-CM | POA: Diagnosis not present

## 2018-01-31 DIAGNOSIS — R279 Unspecified lack of coordination: Secondary | ICD-10-CM | POA: Diagnosis not present

## 2018-01-31 DIAGNOSIS — M6281 Muscle weakness (generalized): Secondary | ICD-10-CM | POA: Diagnosis not present

## 2018-01-31 DIAGNOSIS — Z9181 History of falling: Secondary | ICD-10-CM | POA: Diagnosis not present

## 2018-01-31 DIAGNOSIS — M24459 Recurrent dislocation, unspecified hip: Secondary | ICD-10-CM | POA: Diagnosis not present

## 2018-01-31 DIAGNOSIS — M542 Cervicalgia: Secondary | ICD-10-CM | POA: Diagnosis not present

## 2018-01-31 DIAGNOSIS — R262 Difficulty in walking, not elsewhere classified: Secondary | ICD-10-CM | POA: Diagnosis not present

## 2018-02-01 DIAGNOSIS — Z9181 History of falling: Secondary | ICD-10-CM | POA: Diagnosis not present

## 2018-02-01 DIAGNOSIS — R279 Unspecified lack of coordination: Secondary | ICD-10-CM | POA: Diagnosis not present

## 2018-02-01 DIAGNOSIS — M542 Cervicalgia: Secondary | ICD-10-CM | POA: Diagnosis not present

## 2018-02-01 DIAGNOSIS — M24459 Recurrent dislocation, unspecified hip: Secondary | ICD-10-CM | POA: Diagnosis not present

## 2018-02-01 DIAGNOSIS — R262 Difficulty in walking, not elsewhere classified: Secondary | ICD-10-CM | POA: Diagnosis not present

## 2018-02-01 DIAGNOSIS — M6281 Muscle weakness (generalized): Secondary | ICD-10-CM | POA: Diagnosis not present

## 2018-02-02 DIAGNOSIS — M25552 Pain in left hip: Secondary | ICD-10-CM | POA: Diagnosis not present

## 2018-02-02 DIAGNOSIS — T84021A Dislocation of internal left hip prosthesis, initial encounter: Secondary | ICD-10-CM | POA: Diagnosis not present

## 2018-02-03 DIAGNOSIS — Z9181 History of falling: Secondary | ICD-10-CM | POA: Diagnosis not present

## 2018-02-03 DIAGNOSIS — M542 Cervicalgia: Secondary | ICD-10-CM | POA: Diagnosis not present

## 2018-02-03 DIAGNOSIS — M24459 Recurrent dislocation, unspecified hip: Secondary | ICD-10-CM | POA: Diagnosis not present

## 2018-02-03 DIAGNOSIS — R279 Unspecified lack of coordination: Secondary | ICD-10-CM | POA: Diagnosis not present

## 2018-02-03 DIAGNOSIS — M6281 Muscle weakness (generalized): Secondary | ICD-10-CM | POA: Diagnosis not present

## 2018-02-03 DIAGNOSIS — R262 Difficulty in walking, not elsewhere classified: Secondary | ICD-10-CM | POA: Diagnosis not present

## 2018-02-05 DIAGNOSIS — M24459 Recurrent dislocation, unspecified hip: Secondary | ICD-10-CM | POA: Diagnosis not present

## 2018-02-05 DIAGNOSIS — M542 Cervicalgia: Secondary | ICD-10-CM | POA: Diagnosis not present

## 2018-02-05 DIAGNOSIS — M6281 Muscle weakness (generalized): Secondary | ICD-10-CM | POA: Diagnosis not present

## 2018-02-05 DIAGNOSIS — Z9181 History of falling: Secondary | ICD-10-CM | POA: Diagnosis not present

## 2018-02-05 DIAGNOSIS — R262 Difficulty in walking, not elsewhere classified: Secondary | ICD-10-CM | POA: Diagnosis not present

## 2018-02-05 DIAGNOSIS — R279 Unspecified lack of coordination: Secondary | ICD-10-CM | POA: Diagnosis not present

## 2018-02-06 DIAGNOSIS — R279 Unspecified lack of coordination: Secondary | ICD-10-CM | POA: Diagnosis not present

## 2018-02-06 DIAGNOSIS — M6281 Muscle weakness (generalized): Secondary | ICD-10-CM | POA: Diagnosis not present

## 2018-02-06 DIAGNOSIS — R262 Difficulty in walking, not elsewhere classified: Secondary | ICD-10-CM | POA: Diagnosis not present

## 2018-02-06 DIAGNOSIS — M24459 Recurrent dislocation, unspecified hip: Secondary | ICD-10-CM | POA: Diagnosis not present

## 2018-02-06 DIAGNOSIS — Z9181 History of falling: Secondary | ICD-10-CM | POA: Diagnosis not present

## 2018-02-06 DIAGNOSIS — M542 Cervicalgia: Secondary | ICD-10-CM | POA: Diagnosis not present

## 2018-02-07 DIAGNOSIS — M542 Cervicalgia: Secondary | ICD-10-CM | POA: Diagnosis not present

## 2018-02-07 DIAGNOSIS — R262 Difficulty in walking, not elsewhere classified: Secondary | ICD-10-CM | POA: Diagnosis not present

## 2018-02-07 DIAGNOSIS — Z9181 History of falling: Secondary | ICD-10-CM | POA: Diagnosis not present

## 2018-02-07 DIAGNOSIS — M24459 Recurrent dislocation, unspecified hip: Secondary | ICD-10-CM | POA: Diagnosis not present

## 2018-02-07 DIAGNOSIS — R279 Unspecified lack of coordination: Secondary | ICD-10-CM | POA: Diagnosis not present

## 2018-02-07 DIAGNOSIS — M6281 Muscle weakness (generalized): Secondary | ICD-10-CM | POA: Diagnosis not present

## 2018-02-08 DIAGNOSIS — R279 Unspecified lack of coordination: Secondary | ICD-10-CM | POA: Diagnosis not present

## 2018-02-08 DIAGNOSIS — M6281 Muscle weakness (generalized): Secondary | ICD-10-CM | POA: Diagnosis not present

## 2018-02-08 DIAGNOSIS — R262 Difficulty in walking, not elsewhere classified: Secondary | ICD-10-CM | POA: Diagnosis not present

## 2018-02-08 DIAGNOSIS — M24459 Recurrent dislocation, unspecified hip: Secondary | ICD-10-CM | POA: Diagnosis not present

## 2018-02-08 DIAGNOSIS — M542 Cervicalgia: Secondary | ICD-10-CM | POA: Diagnosis not present

## 2018-02-08 DIAGNOSIS — Z9181 History of falling: Secondary | ICD-10-CM | POA: Diagnosis not present

## 2018-02-10 DIAGNOSIS — Z9181 History of falling: Secondary | ICD-10-CM | POA: Diagnosis not present

## 2018-02-10 DIAGNOSIS — M542 Cervicalgia: Secondary | ICD-10-CM | POA: Diagnosis not present

## 2018-02-10 DIAGNOSIS — R262 Difficulty in walking, not elsewhere classified: Secondary | ICD-10-CM | POA: Diagnosis not present

## 2018-02-10 DIAGNOSIS — R279 Unspecified lack of coordination: Secondary | ICD-10-CM | POA: Diagnosis not present

## 2018-02-10 DIAGNOSIS — M6281 Muscle weakness (generalized): Secondary | ICD-10-CM | POA: Diagnosis not present

## 2018-02-10 DIAGNOSIS — M24459 Recurrent dislocation, unspecified hip: Secondary | ICD-10-CM | POA: Diagnosis not present

## 2018-02-13 DIAGNOSIS — M24459 Recurrent dislocation, unspecified hip: Secondary | ICD-10-CM | POA: Diagnosis not present

## 2018-02-13 DIAGNOSIS — Z9181 History of falling: Secondary | ICD-10-CM | POA: Diagnosis not present

## 2018-02-13 DIAGNOSIS — M6281 Muscle weakness (generalized): Secondary | ICD-10-CM | POA: Diagnosis not present

## 2018-02-13 DIAGNOSIS — R262 Difficulty in walking, not elsewhere classified: Secondary | ICD-10-CM | POA: Diagnosis not present

## 2018-02-13 DIAGNOSIS — M542 Cervicalgia: Secondary | ICD-10-CM | POA: Diagnosis not present

## 2018-03-07 DIAGNOSIS — M542 Cervicalgia: Secondary | ICD-10-CM | POA: Diagnosis not present

## 2018-03-07 DIAGNOSIS — R278 Other lack of coordination: Secondary | ICD-10-CM | POA: Diagnosis not present

## 2018-03-07 DIAGNOSIS — M6281 Muscle weakness (generalized): Secondary | ICD-10-CM | POA: Diagnosis not present

## 2018-03-10 DIAGNOSIS — M6281 Muscle weakness (generalized): Secondary | ICD-10-CM | POA: Diagnosis not present

## 2018-03-10 DIAGNOSIS — M542 Cervicalgia: Secondary | ICD-10-CM | POA: Diagnosis not present

## 2018-03-10 DIAGNOSIS — R278 Other lack of coordination: Secondary | ICD-10-CM | POA: Diagnosis not present

## 2018-03-14 DIAGNOSIS — R278 Other lack of coordination: Secondary | ICD-10-CM | POA: Diagnosis not present

## 2018-03-14 DIAGNOSIS — M542 Cervicalgia: Secondary | ICD-10-CM | POA: Diagnosis not present

## 2018-03-14 DIAGNOSIS — M6281 Muscle weakness (generalized): Secondary | ICD-10-CM | POA: Diagnosis not present

## 2018-03-16 DIAGNOSIS — M6281 Muscle weakness (generalized): Secondary | ICD-10-CM | POA: Diagnosis not present

## 2018-03-16 DIAGNOSIS — M542 Cervicalgia: Secondary | ICD-10-CM | POA: Diagnosis not present

## 2018-03-16 DIAGNOSIS — R278 Other lack of coordination: Secondary | ICD-10-CM | POA: Diagnosis not present

## 2018-03-20 DIAGNOSIS — R278 Other lack of coordination: Secondary | ICD-10-CM | POA: Diagnosis not present

## 2018-03-20 DIAGNOSIS — M542 Cervicalgia: Secondary | ICD-10-CM | POA: Diagnosis not present

## 2018-03-20 DIAGNOSIS — M6281 Muscle weakness (generalized): Secondary | ICD-10-CM | POA: Diagnosis not present

## 2018-03-22 DIAGNOSIS — R278 Other lack of coordination: Secondary | ICD-10-CM | POA: Diagnosis not present

## 2018-03-22 DIAGNOSIS — M6281 Muscle weakness (generalized): Secondary | ICD-10-CM | POA: Diagnosis not present

## 2018-03-22 DIAGNOSIS — M542 Cervicalgia: Secondary | ICD-10-CM | POA: Diagnosis not present

## 2018-03-27 DIAGNOSIS — M542 Cervicalgia: Secondary | ICD-10-CM | POA: Diagnosis not present

## 2018-03-27 DIAGNOSIS — M47812 Spondylosis without myelopathy or radiculopathy, cervical region: Secondary | ICD-10-CM | POA: Diagnosis not present

## 2018-03-27 DIAGNOSIS — M9901 Segmental and somatic dysfunction of cervical region: Secondary | ICD-10-CM | POA: Diagnosis not present

## 2018-03-27 DIAGNOSIS — R278 Other lack of coordination: Secondary | ICD-10-CM | POA: Diagnosis not present

## 2018-03-27 DIAGNOSIS — M6281 Muscle weakness (generalized): Secondary | ICD-10-CM | POA: Diagnosis not present

## 2018-03-29 DIAGNOSIS — R278 Other lack of coordination: Secondary | ICD-10-CM | POA: Diagnosis not present

## 2018-03-29 DIAGNOSIS — M6281 Muscle weakness (generalized): Secondary | ICD-10-CM | POA: Diagnosis not present

## 2018-03-29 DIAGNOSIS — M542 Cervicalgia: Secondary | ICD-10-CM | POA: Diagnosis not present

## 2018-03-30 ENCOUNTER — Encounter: Payer: Self-pay | Admitting: Interventional Cardiology

## 2018-04-04 DIAGNOSIS — R278 Other lack of coordination: Secondary | ICD-10-CM | POA: Diagnosis not present

## 2018-04-04 DIAGNOSIS — M542 Cervicalgia: Secondary | ICD-10-CM | POA: Diagnosis not present

## 2018-04-04 DIAGNOSIS — M6281 Muscle weakness (generalized): Secondary | ICD-10-CM | POA: Diagnosis not present

## 2018-04-06 DIAGNOSIS — M542 Cervicalgia: Secondary | ICD-10-CM | POA: Diagnosis not present

## 2018-04-06 DIAGNOSIS — M6281 Muscle weakness (generalized): Secondary | ICD-10-CM | POA: Diagnosis not present

## 2018-04-06 DIAGNOSIS — R278 Other lack of coordination: Secondary | ICD-10-CM | POA: Diagnosis not present

## 2018-04-10 DIAGNOSIS — E871 Hypo-osmolality and hyponatremia: Secondary | ICD-10-CM | POA: Diagnosis not present

## 2018-04-11 DIAGNOSIS — R278 Other lack of coordination: Secondary | ICD-10-CM | POA: Diagnosis not present

## 2018-04-11 DIAGNOSIS — M6281 Muscle weakness (generalized): Secondary | ICD-10-CM | POA: Diagnosis not present

## 2018-04-11 DIAGNOSIS — M542 Cervicalgia: Secondary | ICD-10-CM | POA: Diagnosis not present

## 2018-04-12 DIAGNOSIS — M9901 Segmental and somatic dysfunction of cervical region: Secondary | ICD-10-CM | POA: Diagnosis not present

## 2018-04-12 DIAGNOSIS — M47812 Spondylosis without myelopathy or radiculopathy, cervical region: Secondary | ICD-10-CM | POA: Diagnosis not present

## 2018-04-13 DIAGNOSIS — Z471 Aftercare following joint replacement surgery: Secondary | ICD-10-CM | POA: Diagnosis not present

## 2018-04-13 DIAGNOSIS — Z96642 Presence of left artificial hip joint: Secondary | ICD-10-CM | POA: Diagnosis not present

## 2018-04-13 DIAGNOSIS — M6281 Muscle weakness (generalized): Secondary | ICD-10-CM | POA: Diagnosis not present

## 2018-04-13 DIAGNOSIS — M542 Cervicalgia: Secondary | ICD-10-CM | POA: Diagnosis not present

## 2018-04-13 DIAGNOSIS — Z96652 Presence of left artificial knee joint: Secondary | ICD-10-CM | POA: Insufficient documentation

## 2018-04-13 DIAGNOSIS — Z96651 Presence of right artificial knee joint: Secondary | ICD-10-CM | POA: Insufficient documentation

## 2018-04-13 DIAGNOSIS — Z96653 Presence of artificial knee joint, bilateral: Secondary | ICD-10-CM | POA: Diagnosis not present

## 2018-04-13 DIAGNOSIS — R278 Other lack of coordination: Secondary | ICD-10-CM | POA: Diagnosis not present

## 2018-04-17 ENCOUNTER — Encounter: Payer: Self-pay | Admitting: Interventional Cardiology

## 2018-04-17 ENCOUNTER — Ambulatory Visit (INDEPENDENT_AMBULATORY_CARE_PROVIDER_SITE_OTHER): Payer: Medicare Other | Admitting: Interventional Cardiology

## 2018-04-17 VITALS — BP 176/82 | HR 67 | Ht 64.0 in | Wt 150.0 lb

## 2018-04-17 DIAGNOSIS — I351 Nonrheumatic aortic (valve) insufficiency: Secondary | ICD-10-CM | POA: Diagnosis not present

## 2018-04-17 DIAGNOSIS — I712 Thoracic aortic aneurysm, without rupture, unspecified: Secondary | ICD-10-CM

## 2018-04-17 DIAGNOSIS — I10 Essential (primary) hypertension: Secondary | ICD-10-CM | POA: Diagnosis not present

## 2018-04-17 NOTE — Progress Notes (Signed)
Cardiology Office Note:    Date:  04/17/2018   ID:  [name], DOB 03-Mar-1930, MRN [id number]  PCP:  Seward Carol, MD  Cardiologist:  Sinclair Grooms, MD   Referring MD: Seward Carol, MD   Chief Complaint  Patient presents with  . Hypertension  . Thoracic Aortic Aneurysm    History of Present Illness:    [name] is a 85 y.o. female with a hx of ascending aortic aneurysm (4.6 cm in diameter), hypertension, mild aortic regurgitation, and history of paroxysmal SVT.  She returns today for follow-up.  She has deferred further surveillance of thoracic aortic aneurysm citing her age as the reason.  We discussed the potential for endovascular therapy however she is not interested.  She is having difficulty with blood pressure variability at home.  Some recorded values are shared with me and they can vary from 193 mmHg systolic to 790 mmHg systolic.  These pressures are monitored at random.  They have no relationship to medication administration.  Recent history of hyponatremia has led to addition of sodium chloride tablets to her medication list.  She takes 1 g daily.  She also shares that valsartan is not taken daily but as needed based on blood pressure recordings that she obtains.  If her pressure gets above 190 she will take it.  She takes it very rarely.    Past Medical History:  Diagnosis Date  . Anemia    CHRONIC  . Aortic insufficiency    a. 2D echo 08/29/15: EF 55-60%, mild LVH, diastolic dysfunction, elevated LV filling pressure, mild AI, severe LAE, mild RAE, mild TR, dilated descending thoracic aorta just distal to the takeoff of the left subclavian, measuring 4.1 cm, no pericardial effusion.  . Arthritis    OA-SOME BACK AND NECK PAIN,  GOES TO CHIROPRACTOR TWICE A MONTH;  HX OF JOINT REPLACEMENTS   . Cancer ([place])    SKIN CANCERS REMOVED FROM LEGS  . CKD (chronic kidney disease), stage II   . Essential hypertension   . Gait abnormality 09/08/2017    . GERD (gastroesophageal reflux disease)   . PSVT (paroxysmal supraventricular tachycardia) ([place])   . Recurrent Pericarditis    RECURRENT   . Thoracic aneurysm    a. Followed by [facility] -last seen in 12/2013 w/ plan for f/u MRA and thoracic surgery f/u in 2 yrs, but patient does not wish to follow any longer.    Past Surgical History:  Procedure Laterality Date  . APPENDECTOMY    . BREAST EXCISIONAL BIOPSY Left 1992   benign  . EXCISION/RELEASE BURSA HIP Left 11/24/2012   Procedure: LEFT HIP BURSECTOMY AND TENDON REPAIR ;  Surgeon: Gearlean Alf, MD;  Location: [facility];  Service: Orthopedics;  Laterality: Left;  . HIP CLOSED REDUCTION Left 12/19/2012   Procedure: CLOSED MANIPULATION HIP;  Surgeon: Mauri Pole, MD;  Location: [facility];  Service: Orthopedics;  Laterality: Left;  . HIP CLOSED REDUCTION Left 12/05/2013   Procedure: CLOSED MANIPULATION HIP;  Surgeon: Augustin Schooling, MD;  Location: [facility];  Service: Orthopedics;  Laterality: Left;  . HIP CLOSED REDUCTION Left 09/22/2015   Procedure: CLOSED REDUCTION HIP;  Surgeon: Melina Schools, MD;  Location: [facility];  Service: Orthopedics;  Laterality: Left;  . JOINT REPLACEMENT     LEFT TOTAL HIP REPLACEMENT AND REVISIONS X 2  . OOPHORECTOMY     has a partial of one ovary remaingin  . PELVIC LAPAROSCOPY  ovarian cyst removal,   . REVISION TOTAL HIP ARTHROPLASTY     left  . right Achilles Tendon repair  5/13  . TOTAL KNEE ARTHROPLASTY     right  . TOTAL KNEE ARTHROPLASTY     left  . VAGINAL HYSTERECTOMY     with ovarian cyst removal    Current Medications: Current Meds  Medication Sig  . aspirin 81 MG tablet Take 81 mg by mouth daily.  . Calcium Carbonate-Vit D-Min (CALCIUM 1200 PO) Take 1 tablet by mouth daily.   . carboxymethylcellulose (REFRESH TEARS) 0.5 % SOLN Place 1 drop into both eyes daily.   . carvedilol (COREG) 12.5 MG tablet Take 12.5 mg by mouth. 1/2 in the Am; 1 in the PM  . cholecalciferol (VITAMIN D) 1000  UNITS tablet Take 1,000 Units by mouth daily.  . clobetasol cream (TEMOVATE) 9.21 % Apply 1 application topically 2 (two) times daily as needed (itching).   Marland Kitchen diltiazem (CARDIZEM CD) 240 MG 24 hr capsule Take 1 capsule (240 mg total) by mouth daily. Please keep upcoming appt in February with Dr. Tamala Julian for future refills. Thank you  . estrogens, conjugated, (PREMARIN) 0.3 MG tablet Take 1 tablet (0.3 mg total) by mouth daily.  . meloxicam (MOBIC) 7.5 MG tablet Take 7.5 mg by mouth daily as needed for pain.   . mupirocin ointment (BACTROBAN) 2 % Apply 1 application topically 2 (two) times daily.  Marland Kitchen omega-3 acid ethyl esters (LOVAZA) 1 g capsule Take 2 g by mouth daily.  . [DISCONTINUED] valsartan (DIOVAN) 160 MG tablet TAKE 1 TABLET BY MOUTH EVERY DAY     Allergies:   Keflex [cephalexin]; Shellfish allergy; Shellfish-derived products; Clindamycin/lincomycin; Lincomycin; Sugar-protein-starch; Tape; Ciprofloxacin; Sulfa antibiotics; and Sulfites   Social History   Socioeconomic History  . Marital status: Married    Spouse name: Not on file  . Number of children: Not on file  . Years of education: Not on file  . Highest education level: Not on file  Occupational History  . Not on file  Social Needs  . Financial resource strain: Not on file  . Food insecurity:    Worry: Not on file    Inability: Not on file  . Transportation needs:    Medical: Not on file    Non-medical: Not on file  Tobacco Use  . Smoking status: Former Smoker    Types: Cigarettes  . Smokeless tobacco: Never Used  . Tobacco comment: quit age 41  Substance and Sexual Activity  . Alcohol use: No    Alcohol/week: 0.0 standard drinks  . Drug use: No  . Sexual activity: Not Currently    Birth control/protection: Surgical    Comment: HYST-1st intercourse 85 yo-Fewer than 5 partners  Lifestyle  . Physical activity:    Days per week: Not on file    Minutes per session: Not on file  . Stress: Not on file   Relationships  . Social connections:    Talks on phone: Not on file    Gets together: Not on file    Attends religious service: Not on file    Active member of club or organization: Not on file    Attends meetings of clubs or organizations: Not on file    Relationship status: Not on file  Other Topics Concern  . Not on file  Social History Narrative  . Not on file     Family History: The patient's family history includes ALS in an other family member;  Heart attack (age of onset: 4) in her father; Hypertension in her mother; Stroke in her mother. There is no history of Breast cancer.  ROS:   Please see the history of present illness.    Anxiety concerning her husband's chronic illness.  Recent falls.  Takes a sodium tablet to prevent falls by keeping her blood pressure elevated.  This is prescribed by her primary physician.  All other systems reviewed and are negative.  EKGs/Labs/Other Studies Reviewed:    The following studies were reviewed today: No new functional or imaging data.  EKG:  EKG sinus rhythm, left atrial abnormality, poor R wave progression V1 through V4.  Left ventricular hypertrophy by voltage.  Recent Labs: No results found for requested labs within last 8760 hours.  Recent Lipid Panel No results found for: CHOL, TRIG, HDL, CHOLHDL, VLDL, LDLCALC, LDLDIRECT  Physical Exam:    VS:  BP (!) 176/82   Pulse 67   Ht 5\' 4"  (1.626 m)   Wt 150 lb (68 kg)   SpO2 97%   BMI 25.75 kg/m     Wt Readings from Last 3 Encounters:  04/17/18 150 lb (68 kg)  10/06/17 143 lb (64.9 kg)  09/08/17 144 lb (65.3 kg)     GEN: Elderly.. No acute distress HEENT: Normal NECK: No JVD. LYMPHATICS: No lymphadenopathy CARDIAC: RRR.  1/6 to 2/6 systolic and 2/6 to 3/6 decrescendo diastolic murmur, no gallop, no edema VASCULAR: 2+ bilateral radial and carotid pulses, no bruits RESPIRATORY:  Clear to auscultation without rales, wheezing or rhonchi  ABDOMEN: Soft, non-tender,  non-distended, No pulsatile mass, MUSCULOSKELETAL: No deformity  SKIN: Warm and dry NEUROLOGIC:  Alert and oriented x 3 PSYCHIATRIC:  Normal affect   ASSESSMENT:    1. Nonrheumatic aortic valve insufficiency   2. Essential hypertension   3. Thoracic aortic aneurysm without rupture ([place])    PLAN:    In order of problems listed above:  1. Aortic regurgitation with wide pulse pressure.  Attempting to protect against progressive aortic enlargement and worsening aortic regurgitation with better blood pressure control.  If she uses too much carvedilol and causes pressure get too low and she is presyncopal.  She has not taken valsartan regularly because blood pressures get too low.  I have advised her to record blood pressures 3 hours after the a.m. medications and we will make a judgment based upon those values about whether or not changes are required. 2. Blood pressure is very poorly controlled.  She will measure some pressures at home and we will make a recommendation after we have more data.  12.5 mg of carvedilol twice daily has led to episodes of profound weakness and near syncope in the past. 3. She has decided against longitudinal follow-up and would want expectant therapy for aortic dissection/rupture.  Because of recurring episodes of falls and hyponatremia, she has now on sodium tablets that she takes each day (1 g daily).  Blood pressures will be monitored for 1 week.  Further recommendations concerning blood pressure once we have data.  She may have to be worked back into the blood pressure clinic.   Medication Adjustments/Labs and Tests Ordered: Current medicines are reviewed at length with the patient today.  Concerns regarding medicines are outlined above.  Orders Placed This Encounter  Procedures  . EKG 12-Lead   No orders of the defined types were placed in this encounter.   Patient Instructions  Medication Instructions:  Your physician has recommended you make the  following  change in your medication:  STOP Valsartan (Diovan)  If you need a refill on your cardiac medications before your next appointment, please call your pharmacy.    Lab work: None Ordered    Testing/Procedures: HOW TO TAKE YOUR BLOOD PRESSURE:  Rest 5 minutes before taking your blood pressure.   Don't smoke or drink caffeinated beverages for at least 30 minutes before.  Take your blood pressure 3 to 4 hours after you take Carvedilol and Diltiazem   Sit comfortably with your back supported and both feet on the floor (don't cross your legs).  Elevate your arm to heart level on a table or a desk.  Use the proper sized cuff. It should fit smoothly and snugly around your bare upper arm. There should be enough room to slip a fingertip under the cuff. The bottom edge of the cuff should be 1 inch above the crease of the elbow.  **Call us in 2 weeks to report your BP readings  Follow-Up: At [facility], you and your health needs are our priority.  As part of our continuing mission to provide you with exceptional heart care, we have created designated Provider Care Teams.  These Care Teams include your primary Cardiologist (physician) and Advanced Practice Providers (APPs -  Physician Assistants and Nurse Practitioners) who all work together to provide you with the care you need, when you need it. You will need a follow up appointment in 1 years.  Please call our office 2 months in advance to schedule this appointment.  You may see Sinclair Grooms, MD or one of the following Advanced Practice Providers on your designated Care Team:   Truitt Merle, NP Cecilie Kicks, NP . Kathyrn Drown, NP       Signed, Sinclair Grooms, MD  04/17/2018 5:23 PM    [place]

## 2018-04-17 NOTE — Patient Instructions (Addendum)
Medication Instructions:  Your physician has recommended you make the following change in your medication:  STOP Valsartan (Diovan)  If you need a refill on your cardiac medications before your next appointment, please call your pharmacy.    Lab work: None Ordered    Testing/Procedures: HOW TO TAKE YOUR BLOOD PRESSURE:  Rest 5 minutes before taking your blood pressure.   Don't smoke or drink caffeinated beverages for at least 30 minutes before.  Take your blood pressure 3 to 4 hours after you take Carvedilol and Diltiazem   Sit comfortably with your back supported and both feet on the floor (don't cross your legs).  Elevate your arm to heart level on a table or a desk.  Use the proper sized cuff. It should fit smoothly and snugly around your bare upper arm. There should be enough room to slip a fingertip under the cuff. The bottom edge of the cuff should be 1 inch above the crease of the elbow.  **Call us in 2 weeks to report your BP readings  Follow-Up: At [facility], you and your health needs are our priority.  As part of our continuing mission to provide you with exceptional heart care, we have created designated Provider Care Teams.  These Care Teams include your primary Cardiologist (physician) and Advanced Practice Providers (APPs -  Physician Assistants and Nurse Practitioners) who all work together to provide you with the care you need, when you need it. You will need a follow up appointment in 1 years.  Please call our office 2 months in advance to schedule this appointment.  You may see Sinclair Grooms, MD or one of the following Advanced Practice Providers on your designated Care Team:   Truitt Merle, NP Cecilie Kicks, NP . Kathyrn Drown, NP

## 2018-04-18 DIAGNOSIS — M542 Cervicalgia: Secondary | ICD-10-CM | POA: Diagnosis not present

## 2018-04-18 DIAGNOSIS — M6281 Muscle weakness (generalized): Secondary | ICD-10-CM | POA: Diagnosis not present

## 2018-04-18 DIAGNOSIS — R278 Other lack of coordination: Secondary | ICD-10-CM | POA: Diagnosis not present

## 2018-04-24 ENCOUNTER — Other Ambulatory Visit: Payer: Self-pay | Admitting: Interventional Cardiology

## 2018-04-25 DIAGNOSIS — L282 Other prurigo: Secondary | ICD-10-CM | POA: Diagnosis not present

## 2018-04-25 DIAGNOSIS — M6281 Muscle weakness (generalized): Secondary | ICD-10-CM | POA: Diagnosis not present

## 2018-04-25 DIAGNOSIS — M542 Cervicalgia: Secondary | ICD-10-CM | POA: Diagnosis not present

## 2018-04-25 DIAGNOSIS — R278 Other lack of coordination: Secondary | ICD-10-CM | POA: Diagnosis not present

## 2018-04-25 DIAGNOSIS — Z85828 Personal history of other malignant neoplasm of skin: Secondary | ICD-10-CM | POA: Diagnosis not present

## 2018-04-25 DIAGNOSIS — L300 Nummular dermatitis: Secondary | ICD-10-CM | POA: Diagnosis not present

## 2018-04-25 DIAGNOSIS — L858 Other specified epidermal thickening: Secondary | ICD-10-CM | POA: Diagnosis not present

## 2018-04-27 DIAGNOSIS — M6281 Muscle weakness (generalized): Secondary | ICD-10-CM | POA: Diagnosis not present

## 2018-04-27 DIAGNOSIS — M542 Cervicalgia: Secondary | ICD-10-CM | POA: Diagnosis not present

## 2018-04-27 DIAGNOSIS — R278 Other lack of coordination: Secondary | ICD-10-CM | POA: Diagnosis not present

## 2018-04-28 DIAGNOSIS — M4802 Spinal stenosis, cervical region: Secondary | ICD-10-CM | POA: Diagnosis not present

## 2018-05-09 DIAGNOSIS — M47812 Spondylosis without myelopathy or radiculopathy, cervical region: Secondary | ICD-10-CM | POA: Diagnosis not present

## 2018-05-09 DIAGNOSIS — M9901 Segmental and somatic dysfunction of cervical region: Secondary | ICD-10-CM | POA: Diagnosis not present

## 2018-05-20 DIAGNOSIS — M5030 Other cervical disc degeneration, unspecified cervical region: Secondary | ICD-10-CM | POA: Diagnosis not present

## 2018-06-06 DIAGNOSIS — M9901 Segmental and somatic dysfunction of cervical region: Secondary | ICD-10-CM | POA: Diagnosis not present

## 2018-06-06 DIAGNOSIS — M47812 Spondylosis without myelopathy or radiculopathy, cervical region: Secondary | ICD-10-CM | POA: Diagnosis not present

## 2018-06-07 DIAGNOSIS — M47812 Spondylosis without myelopathy or radiculopathy, cervical region: Secondary | ICD-10-CM | POA: Insufficient documentation

## 2018-06-07 DIAGNOSIS — M5030 Other cervical disc degeneration, unspecified cervical region: Secondary | ICD-10-CM | POA: Diagnosis not present

## 2018-06-14 ENCOUNTER — Other Ambulatory Visit: Payer: Self-pay

## 2018-06-14 ENCOUNTER — Ambulatory Visit (INDEPENDENT_AMBULATORY_CARE_PROVIDER_SITE_OTHER): Payer: Medicare Other | Admitting: Podiatry

## 2018-06-14 ENCOUNTER — Encounter: Payer: Self-pay | Admitting: Podiatry

## 2018-06-14 VITALS — Temp 97.7°F

## 2018-06-14 DIAGNOSIS — M2031 Hallux varus (acquired), right foot: Secondary | ICD-10-CM

## 2018-06-14 DIAGNOSIS — Q828 Other specified congenital malformations of skin: Secondary | ICD-10-CM | POA: Diagnosis not present

## 2018-06-14 NOTE — Progress Notes (Signed)
This patient presents the office with chief complaint of painful callus under her big toe right foot..   Callus is  painful walking and wearing her shoes.    She says she had surgery years ago and developed a hallux valgus.  She then had surgery performed by an orthopedic doctor in [place] for the correction.  Following his surgery. There is still a dorsiflex position of the right hallux with a painful callus under the right hallux.  She says this is extremely painful walking and wearing her shoes.  She presents the office today for an evaluation and treatment of her painful nails and callus.  General Appearance  Alert, conversant and in no acute stress.  Vascular  Dorsalis pedis and posterior pulses are palpable  bilaterally.  Capillary return is within normal limits  Bilaterally. Temperature is within normal limits  Bilaterally  Neurologic  Senn-Weinstein monofilament wire test within normal limits  bilaterally. Muscle power  Within normal limits bilaterally.  Nails Thick disfigured discolored nails with subungual debride bilaterally from hallux to fifth toes bilaterally. No evidence of bacterial infection or drainage bilaterally.  Orthopedic  No limitations of motion of motion feet bilaterally.  No crepitus or effusions noted.  No bony pathology or digital deformities noted. Mild hallux varus right hallux with fused IPJ right hallux.  Hallux is in dorsiflexed position right foot.    Skin  normotropic skin with porokeratosis noted right hallux.  No signs of infections or ulcers noted.    Onychomycosis  B/L  Porokeratosis  Right hallux right.  ROV    Debride callus.  Padding was dispensed.  RTC prn.   Gardiner Barefoot DPM

## 2018-07-04 DIAGNOSIS — M47812 Spondylosis without myelopathy or radiculopathy, cervical region: Secondary | ICD-10-CM | POA: Diagnosis not present

## 2018-07-04 DIAGNOSIS — M9901 Segmental and somatic dysfunction of cervical region: Secondary | ICD-10-CM | POA: Diagnosis not present

## 2018-07-19 DIAGNOSIS — M9901 Segmental and somatic dysfunction of cervical region: Secondary | ICD-10-CM | POA: Diagnosis not present

## 2018-07-19 DIAGNOSIS — M47812 Spondylosis without myelopathy or radiculopathy, cervical region: Secondary | ICD-10-CM | POA: Diagnosis not present

## 2018-08-02 DIAGNOSIS — L570 Actinic keratosis: Secondary | ICD-10-CM | POA: Diagnosis not present

## 2018-08-02 DIAGNOSIS — I8311 Varicose veins of right lower extremity with inflammation: Secondary | ICD-10-CM | POA: Diagnosis not present

## 2018-08-02 DIAGNOSIS — I8312 Varicose veins of left lower extremity with inflammation: Secondary | ICD-10-CM | POA: Diagnosis not present

## 2018-08-02 DIAGNOSIS — I872 Venous insufficiency (chronic) (peripheral): Secondary | ICD-10-CM | POA: Diagnosis not present

## 2018-08-02 DIAGNOSIS — Z85828 Personal history of other malignant neoplasm of skin: Secondary | ICD-10-CM | POA: Diagnosis not present

## 2018-08-02 DIAGNOSIS — C44722 Squamous cell carcinoma of skin of right lower limb, including hip: Secondary | ICD-10-CM | POA: Diagnosis not present

## 2018-08-15 DIAGNOSIS — M47812 Spondylosis without myelopathy or radiculopathy, cervical region: Secondary | ICD-10-CM | POA: Diagnosis not present

## 2018-08-15 DIAGNOSIS — M9901 Segmental and somatic dysfunction of cervical region: Secondary | ICD-10-CM | POA: Diagnosis not present

## 2018-09-05 DIAGNOSIS — M9901 Segmental and somatic dysfunction of cervical region: Secondary | ICD-10-CM | POA: Diagnosis not present

## 2018-09-05 DIAGNOSIS — M47812 Spondylosis without myelopathy or radiculopathy, cervical region: Secondary | ICD-10-CM | POA: Diagnosis not present

## 2018-09-06 ENCOUNTER — Other Ambulatory Visit: Payer: Self-pay | Admitting: Internal Medicine

## 2018-09-06 DIAGNOSIS — Z1231 Encounter for screening mammogram for malignant neoplasm of breast: Secondary | ICD-10-CM

## 2018-09-06 DIAGNOSIS — R5383 Other fatigue: Secondary | ICD-10-CM | POA: Diagnosis not present

## 2018-09-06 DIAGNOSIS — Z79899 Other long term (current) drug therapy: Secondary | ICD-10-CM | POA: Diagnosis not present

## 2018-09-08 DIAGNOSIS — D692 Other nonthrombocytopenic purpura: Secondary | ICD-10-CM | POA: Diagnosis not present

## 2018-09-08 DIAGNOSIS — I8311 Varicose veins of right lower extremity with inflammation: Secondary | ICD-10-CM | POA: Diagnosis not present

## 2018-09-08 DIAGNOSIS — L820 Inflamed seborrheic keratosis: Secondary | ICD-10-CM | POA: Diagnosis not present

## 2018-09-08 DIAGNOSIS — Z85828 Personal history of other malignant neoplasm of skin: Secondary | ICD-10-CM | POA: Diagnosis not present

## 2018-09-08 DIAGNOSIS — L821 Other seborrheic keratosis: Secondary | ICD-10-CM | POA: Diagnosis not present

## 2018-09-08 DIAGNOSIS — I8312 Varicose veins of left lower extremity with inflammation: Secondary | ICD-10-CM | POA: Diagnosis not present

## 2018-09-08 DIAGNOSIS — I872 Venous insufficiency (chronic) (peripheral): Secondary | ICD-10-CM | POA: Diagnosis not present

## 2018-09-08 DIAGNOSIS — L245 Irritant contact dermatitis due to other chemical products: Secondary | ICD-10-CM | POA: Diagnosis not present

## 2018-09-20 DIAGNOSIS — M47812 Spondylosis without myelopathy or radiculopathy, cervical region: Secondary | ICD-10-CM | POA: Diagnosis not present

## 2018-09-20 DIAGNOSIS — M9901 Segmental and somatic dysfunction of cervical region: Secondary | ICD-10-CM | POA: Diagnosis not present

## 2018-10-04 DIAGNOSIS — M47812 Spondylosis without myelopathy or radiculopathy, cervical region: Secondary | ICD-10-CM | POA: Diagnosis not present

## 2018-10-04 DIAGNOSIS — M9901 Segmental and somatic dysfunction of cervical region: Secondary | ICD-10-CM | POA: Diagnosis not present

## 2018-10-10 ENCOUNTER — Ambulatory Visit (INDEPENDENT_AMBULATORY_CARE_PROVIDER_SITE_OTHER): Payer: Medicare Other | Admitting: Gynecology

## 2018-10-10 ENCOUNTER — Other Ambulatory Visit: Payer: Self-pay

## 2018-10-10 ENCOUNTER — Encounter: Payer: Self-pay | Admitting: Gynecology

## 2018-10-10 VITALS — BP 140/80 | Ht 64.0 in | Wt 147.0 lb

## 2018-10-10 DIAGNOSIS — N952 Postmenopausal atrophic vaginitis: Secondary | ICD-10-CM

## 2018-10-10 DIAGNOSIS — Z79890 Hormone replacement therapy (postmenopausal): Secondary | ICD-10-CM

## 2018-10-10 DIAGNOSIS — Z01419 Encounter for gynecological examination (general) (routine) without abnormal findings: Secondary | ICD-10-CM

## 2018-10-10 MED ORDER — ESTROGENS CONJUGATED 0.3 MG PO TABS: 0.3000 mg | ORAL_TABLET | Freq: Every day | ORAL | 4 refills | Status: DC

## 2018-10-10 NOTE — Progress Notes (Signed)
    [name] [birth year] [id number]        85 y.o.  G2P2 for breast and pelvic exam.  Without gynecologic complaints.  Continues on Premarin.  Past medical history,surgical history, problem list, medications, allergies, family history and social history were all reviewed and documented as reviewed in the EPIC chart.  ROS:  Performed with pertinent positives and negatives included in the history, assessment and plan.   Additional significant findings : None   Exam: [name] assistant Vitals:   10/10/18 1420  BP: 140/80  Weight: 147 lb (66.7 kg)  Height: 5\' 4"  (1.626 m)   Body mass index is 25.23 kg/m.  General appearance:  Normal affect, orientation and appearance. Skin: Grossly normal HEENT: Without gross lesions.  No cervical or supraclavicular adenopathy. Thyroid normal.  Lungs:  Clear without wheezing, rales or rhonchi Cardiac: RR, without RMG Abdominal:  Soft, nontender, without masses, guarding, rebound, organomegaly or hernia Breasts:  Examined lying and sitting without masses, retractions, discharge or axillary adenopathy. Pelvic:  Ext, BUS, Vagina: With atrophic changes  Adnexa: Without masses or tenderness    Anus and perineum: Normal   Rectovaginal: Normal sphincter tone without palpated masses or tenderness.    Assessment/Plan:  85 y.o. G2P2 female for breast and pelvic exam.  Status post hysterectomy in the past.  1. Postmenopausal/HRT.  Continues on Premarin 0.3 mg daily.  We again discussed the risks versus benefits.  We discussed unusual for patient into her 43s be continuing on HRT.  Patient feels very strongly that she wants to continue as she feels well now and does not want to risk not feeling well.  Risks of thrombosis such as stroke heart attack DVT in the breast cancer issue discussed.  Refill x1 year provided. 2. Mammography due now and she will schedule.  Breast exam normal today. 3. Pap smear 2012.  No Pap smear done today.  We both agree to  stop screening per current screening guidelines. 4. Colonoscopy 11 years ago.  Will follow-up with primary physician's recommendations for colon screening. 5. DEXA 2016 normal.  Plan DEXA next year at 5-year interval 6. Health maintenance.  No routine lab work done as patient does this elsewhere.  Follow-up 1 year, sooner as needed.   Anastasio Auerbach MD, 2:59 PM 10/10/2018

## 2018-10-19 ENCOUNTER — Ambulatory Visit
Admission: RE | Admit: 2018-10-19 | Discharge: 2018-10-19 | Disposition: A | Discharge status: Home / Self Care | Payer: Medicare Other | Source: Ambulatory Visit | Attending: Internal Medicine | Admitting: Internal Medicine

## 2018-10-19 ENCOUNTER — Other Ambulatory Visit: Payer: Self-pay

## 2018-10-19 DIAGNOSIS — Z1231 Encounter for screening mammogram for malignant neoplasm of breast: Secondary | ICD-10-CM

## 2018-10-31 DIAGNOSIS — M47812 Spondylosis without myelopathy or radiculopathy, cervical region: Secondary | ICD-10-CM | POA: Diagnosis not present

## 2018-10-31 DIAGNOSIS — M9901 Segmental and somatic dysfunction of cervical region: Secondary | ICD-10-CM | POA: Diagnosis not present

## 2018-11-13 DIAGNOSIS — I8311 Varicose veins of right lower extremity with inflammation: Secondary | ICD-10-CM | POA: Diagnosis not present

## 2018-11-13 DIAGNOSIS — L300 Nummular dermatitis: Secondary | ICD-10-CM | POA: Diagnosis not present

## 2018-11-13 DIAGNOSIS — I872 Venous insufficiency (chronic) (peripheral): Secondary | ICD-10-CM | POA: Diagnosis not present

## 2018-11-13 DIAGNOSIS — I8312 Varicose veins of left lower extremity with inflammation: Secondary | ICD-10-CM | POA: Diagnosis not present

## 2018-11-13 DIAGNOSIS — Z85828 Personal history of other malignant neoplasm of skin: Secondary | ICD-10-CM | POA: Diagnosis not present

## 2018-11-14 DIAGNOSIS — M47812 Spondylosis without myelopathy or radiculopathy, cervical region: Secondary | ICD-10-CM | POA: Diagnosis not present

## 2018-11-14 DIAGNOSIS — M9901 Segmental and somatic dysfunction of cervical region: Secondary | ICD-10-CM | POA: Diagnosis not present

## 2018-11-25 DIAGNOSIS — Z23 Encounter for immunization: Secondary | ICD-10-CM | POA: Diagnosis not present

## 2018-11-29 DIAGNOSIS — M9901 Segmental and somatic dysfunction of cervical region: Secondary | ICD-10-CM | POA: Diagnosis not present

## 2018-11-29 DIAGNOSIS — M47812 Spondylosis without myelopathy or radiculopathy, cervical region: Secondary | ICD-10-CM | POA: Diagnosis not present

## 2018-12-13 DIAGNOSIS — M47812 Spondylosis without myelopathy or radiculopathy, cervical region: Secondary | ICD-10-CM | POA: Diagnosis not present

## 2018-12-13 DIAGNOSIS — M9901 Segmental and somatic dysfunction of cervical region: Secondary | ICD-10-CM | POA: Diagnosis not present

## 2018-12-15 DIAGNOSIS — L300 Nummular dermatitis: Secondary | ICD-10-CM | POA: Diagnosis not present

## 2018-12-15 DIAGNOSIS — L280 Lichen simplex chronicus: Secondary | ICD-10-CM | POA: Diagnosis not present

## 2018-12-15 DIAGNOSIS — Z85828 Personal history of other malignant neoplasm of skin: Secondary | ICD-10-CM | POA: Diagnosis not present

## 2018-12-21 ENCOUNTER — Encounter: Payer: Self-pay | Admitting: Gynecology

## 2018-12-27 DIAGNOSIS — M9901 Segmental and somatic dysfunction of cervical region: Secondary | ICD-10-CM | POA: Diagnosis not present

## 2018-12-27 DIAGNOSIS — M47812 Spondylosis without myelopathy or radiculopathy, cervical region: Secondary | ICD-10-CM | POA: Diagnosis not present

## 2019-01-03 DIAGNOSIS — R269 Unspecified abnormalities of gait and mobility: Secondary | ICD-10-CM | POA: Diagnosis not present

## 2019-01-03 DIAGNOSIS — D638 Anemia in other chronic diseases classified elsewhere: Secondary | ICD-10-CM | POA: Diagnosis not present

## 2019-01-03 DIAGNOSIS — I712 Thoracic aortic aneurysm, without rupture: Secondary | ICD-10-CM | POA: Diagnosis not present

## 2019-01-03 DIAGNOSIS — N1831 Chronic kidney disease, stage 3a: Secondary | ICD-10-CM | POA: Diagnosis not present

## 2019-01-03 DIAGNOSIS — E871 Hypo-osmolality and hyponatremia: Secondary | ICD-10-CM | POA: Diagnosis not present

## 2019-01-03 DIAGNOSIS — Z1389 Encounter for screening for other disorder: Secondary | ICD-10-CM | POA: Diagnosis not present

## 2019-01-03 DIAGNOSIS — G9389 Other specified disorders of brain: Secondary | ICD-10-CM | POA: Diagnosis not present

## 2019-01-03 DIAGNOSIS — Z0000 Encounter for general adult medical examination without abnormal findings: Secondary | ICD-10-CM | POA: Diagnosis not present

## 2019-01-05 DIAGNOSIS — L300 Nummular dermatitis: Secondary | ICD-10-CM | POA: Diagnosis not present

## 2019-01-05 DIAGNOSIS — B353 Tinea pedis: Secondary | ICD-10-CM | POA: Diagnosis not present

## 2019-01-05 DIAGNOSIS — Z85828 Personal history of other malignant neoplasm of skin: Secondary | ICD-10-CM | POA: Diagnosis not present

## 2019-01-16 DIAGNOSIS — M47812 Spondylosis without myelopathy or radiculopathy, cervical region: Secondary | ICD-10-CM | POA: Diagnosis not present

## 2019-01-16 DIAGNOSIS — M9901 Segmental and somatic dysfunction of cervical region: Secondary | ICD-10-CM | POA: Diagnosis not present

## 2019-01-30 DIAGNOSIS — M47812 Spondylosis without myelopathy or radiculopathy, cervical region: Secondary | ICD-10-CM | POA: Diagnosis not present

## 2019-01-30 DIAGNOSIS — M9901 Segmental and somatic dysfunction of cervical region: Secondary | ICD-10-CM | POA: Diagnosis not present

## 2019-02-13 DIAGNOSIS — M9901 Segmental and somatic dysfunction of cervical region: Secondary | ICD-10-CM | POA: Diagnosis not present

## 2019-02-13 DIAGNOSIS — M47812 Spondylosis without myelopathy or radiculopathy, cervical region: Secondary | ICD-10-CM | POA: Diagnosis not present

## 2019-02-22 ENCOUNTER — Telehealth: Payer: Self-pay | Admitting: *Deleted

## 2019-02-22 MED ORDER — PREMARIN 0.3 MG PO TABS: 0.3000 mg | ORAL_TABLET | Freq: Every day | ORAL | 1 refills | Status: DC

## 2019-02-22 NOTE — Telephone Encounter (Signed)
Left message for patient to call.

## 2019-02-22 NOTE — Telephone Encounter (Signed)
We can send prescription to pharmacy with a check of the DAW (dispenses written) block and they would have to fill it with brand-name.  The question is whether her insurance will pay for it which is a different matter and I cannot control that

## 2019-02-22 NOTE — Telephone Encounter (Signed)
Brand Rx sent

## 2019-02-22 NOTE — Telephone Encounter (Signed)
Patient called asking if you would provide a letter recommending she stay on brand premarin 0.3 mg tablet, patient said her insurance company is trying to switch to generic. Patient has done well on brand and wishes to continue. Please advise

## 2019-02-22 NOTE — Telephone Encounter (Signed)
Patient will call insurance company and get back with me.

## 2019-03-05 DIAGNOSIS — M9901 Segmental and somatic dysfunction of cervical region: Secondary | ICD-10-CM | POA: Diagnosis not present

## 2019-03-05 DIAGNOSIS — M47812 Spondylosis without myelopathy or radiculopathy, cervical region: Secondary | ICD-10-CM | POA: Diagnosis not present

## 2019-03-10 DIAGNOSIS — N3946 Mixed incontinence: Secondary | ICD-10-CM | POA: Insufficient documentation

## 2019-03-21 DIAGNOSIS — M47812 Spondylosis without myelopathy or radiculopathy, cervical region: Secondary | ICD-10-CM | POA: Diagnosis not present

## 2019-03-21 DIAGNOSIS — M9901 Segmental and somatic dysfunction of cervical region: Secondary | ICD-10-CM | POA: Diagnosis not present

## 2019-03-23 DIAGNOSIS — Z85828 Personal history of other malignant neoplasm of skin: Secondary | ICD-10-CM | POA: Diagnosis not present

## 2019-03-23 DIAGNOSIS — L98499 Non-pressure chronic ulcer of skin of other sites with unspecified severity: Secondary | ICD-10-CM | POA: Diagnosis not present

## 2019-03-23 DIAGNOSIS — L245 Irritant contact dermatitis due to other chemical products: Secondary | ICD-10-CM | POA: Diagnosis not present

## 2019-04-03 DIAGNOSIS — M47812 Spondylosis without myelopathy or radiculopathy, cervical region: Secondary | ICD-10-CM | POA: Diagnosis not present

## 2019-04-03 DIAGNOSIS — M9901 Segmental and somatic dysfunction of cervical region: Secondary | ICD-10-CM | POA: Diagnosis not present

## 2019-04-05 DIAGNOSIS — Z23 Encounter for immunization: Secondary | ICD-10-CM | POA: Diagnosis not present

## 2019-04-24 DIAGNOSIS — M47812 Spondylosis without myelopathy or radiculopathy, cervical region: Secondary | ICD-10-CM | POA: Diagnosis not present

## 2019-04-24 DIAGNOSIS — M9901 Segmental and somatic dysfunction of cervical region: Secondary | ICD-10-CM | POA: Diagnosis not present

## 2019-04-25 DIAGNOSIS — E871 Hypo-osmolality and hyponatremia: Secondary | ICD-10-CM | POA: Diagnosis not present

## 2019-04-25 DIAGNOSIS — D649 Anemia, unspecified: Secondary | ICD-10-CM | POA: Diagnosis not present

## 2019-04-30 ENCOUNTER — Telehealth: Payer: Self-pay | Admitting: *Deleted

## 2019-04-30 NOTE — Telephone Encounter (Signed)
Prior authorization for premarin 0.3 mg tablet approved via [employer] until 03/14/2020.

## 2019-05-01 ENCOUNTER — Ambulatory Visit: Payer: Medicare Other | Admitting: Podiatry

## 2019-05-03 DIAGNOSIS — Z23 Encounter for immunization: Secondary | ICD-10-CM | POA: Diagnosis not present

## 2019-05-08 DIAGNOSIS — M47812 Spondylosis without myelopathy or radiculopathy, cervical region: Secondary | ICD-10-CM | POA: Diagnosis not present

## 2019-05-08 DIAGNOSIS — M9901 Segmental and somatic dysfunction of cervical region: Secondary | ICD-10-CM | POA: Diagnosis not present

## 2019-05-11 DIAGNOSIS — R197 Diarrhea, unspecified: Secondary | ICD-10-CM | POA: Diagnosis not present

## 2019-05-14 ENCOUNTER — Ambulatory Visit (INDEPENDENT_AMBULATORY_CARE_PROVIDER_SITE_OTHER): Payer: Medicare Other | Admitting: Podiatry

## 2019-05-14 ENCOUNTER — Encounter: Payer: Self-pay | Admitting: Podiatry

## 2019-05-14 ENCOUNTER — Other Ambulatory Visit: Payer: Self-pay

## 2019-05-14 DIAGNOSIS — R197 Diarrhea, unspecified: Secondary | ICD-10-CM | POA: Diagnosis not present

## 2019-05-14 DIAGNOSIS — B351 Tinea unguium: Secondary | ICD-10-CM

## 2019-05-14 DIAGNOSIS — M79674 Pain in right toe(s): Secondary | ICD-10-CM | POA: Diagnosis not present

## 2019-05-14 DIAGNOSIS — M79675 Pain in left toe(s): Secondary | ICD-10-CM

## 2019-05-14 NOTE — Progress Notes (Signed)
Complaint:  Visit Type: Patient returns to my office for continued preventative foot care services. Complaint: Patient states" my nails have grown long and thick and become painful to walk and wear shoes"  The patient presents for preventative foot care services.  Podiatric Exam: Vascular: dorsalis pedis and posterior tibial pulses are palpable bilateral. Capillary return is immediate. Temperature gradient is WNL. Skin turgor WNL  Sensorium: Normal [place] Weinstein monofilament test. Normal tactile sensation bilaterally. Nail Exam: Pt has thick disfigured discolored nails with subungual debris noted bilateral entire nail hallux through fifth toenails Ulcer Exam: There is no evidence of ulcer or pre-ulcerative changes or infection. Orthopedic Exam: Muscle tone and strength are WNL. No limitations in general ROM. No crepitus or effusions noted. Foot type and digits show no abnormalities.  Mild  Hallux varus with fused IPJ right hallux.   Skin: No Porokeratosis. No infection or ulcers.  Asymptomatic right hallux callus.  Diagnosis:  Onychomycosis, , Pain in right toe, pain in left toes  Treatment & Plan Procedures and Treatment: Consent by patient was obtained for treatment procedures.   Debridement of mycotic and hypertrophic toenails, 1 through 5 bilateral and clearing of subungual debris. No ulceration, no infection noted.  Return Visit-Office Procedure: Patient instructed to return to the office for a follow up visit 4 months for continued evaluation and treatment.    Gardiner Barefoot DPM

## 2019-05-16 ENCOUNTER — Telehealth: Payer: Self-pay | Admitting: *Deleted

## 2019-05-16 NOTE — Telephone Encounter (Signed)
I spoke with pt and she states the bottom of her toe is still sore where Dr. Prudence Davidson trimmed the area. I told pt that if there is no redness, swelling or drainage, that the area exposed after the trimming was sensitive and would gradually come back to normal sensation. I told pt to soak daily for 15 minutes in 1/4 cup epsom salt and 1 quart warm water and apply a light coating of antibiotic ointment bandaid daily for about 4-5 days and it should be less painful, but if worsened or showed signs of infection call for an appt. Pt states understanding.

## 2019-05-16 NOTE — Telephone Encounter (Signed)
Pt called states she had her toenails 05/14/2019 by Dr. Prudence Davidson and he worked on a side of her toe and it is still sore.

## 2019-05-21 NOTE — Progress Notes (Signed)
Cardiology Office Note:    Date:  05/22/2019   ID:  Darlene RAFFENSPERGER, DOB [birth year], MRN [id number]  PCP:  Seward Carol, MD  Cardiologist:  Sinclair Grooms, MD   Referring MD: Seward Carol, MD   No chief complaint on file.   History of Present Illness:    [name] is a 86 y.o. female with a hx of ascending aortic aneurysm (4.6 cm in diameter), hypertension, mild aortic regurgitation, and history of paroxysmal SVT.  She is sad in the absence of Truman Hayward her husband who died last year.  Is been a difficult year.  She has no cardiac complaints.  She denies palpitations, syncope, chest pain, orthopnea, edema, and transient neurological complaints.  She has not had significant back pain.  Medications have been tolerable.  Past Medical History:  Diagnosis Date  . Anemia    CHRONIC  . Aortic insufficiency    a. 2D echo 08/29/15: EF 55-60%, mild LVH, diastolic dysfunction, elevated LV filling pressure, mild AI, severe LAE, mild RAE, mild TR, dilated descending thoracic aorta just distal to the takeoff of the left subclavian, measuring 4.1 cm, no pericardial effusion.  . Arthritis    OA-SOME BACK AND NECK PAIN,  GOES TO CHIROPRACTOR TWICE A MONTH;  HX OF JOINT REPLACEMENTS   . Cancer ([place])    SKIN CANCERS REMOVED FROM LEGS  . CKD (chronic kidney disease), stage II   . Essential hypertension   . Gait abnormality 09/08/2017  . GERD (gastroesophageal reflux disease)   . PSVT (paroxysmal supraventricular tachycardia) ([place])   . Recurrent Pericarditis    RECURRENT   . Thoracic aneurysm    a. Followed by [facility] -last seen in 12/2013 w/ plan for f/u MRA and thoracic surgery f/u in 2 yrs, but patient does not wish to follow any longer.    Past Surgical History:  Procedure Laterality Date  . APPENDECTOMY    . BREAST EXCISIONAL BIOPSY Left 1992   benign  . EXCISION/RELEASE BURSA HIP Left 11/24/2012   Procedure: LEFT HIP BURSECTOMY AND TENDON REPAIR ;  Surgeon: Gearlean Alf,  MD;  Location: [facility];  Service: Orthopedics;  Laterality: Left;  . HIP CLOSED REDUCTION Left 12/19/2012   Procedure: CLOSED MANIPULATION HIP;  Surgeon: Mauri Pole, MD;  Location: [facility];  Service: Orthopedics;  Laterality: Left;  . HIP CLOSED REDUCTION Left 12/05/2013   Procedure: CLOSED MANIPULATION HIP;  Surgeon: Augustin Schooling, MD;  Location: [facility];  Service: Orthopedics;  Laterality: Left;  . HIP CLOSED REDUCTION Left 09/22/2015   Procedure: CLOSED REDUCTION HIP;  Surgeon: Melina Schools, MD;  Location: [facility];  Service: Orthopedics;  Laterality: Left;  . JOINT REPLACEMENT     LEFT TOTAL HIP REPLACEMENT AND REVISIONS X 2  . OOPHORECTOMY     has a partial of one ovary remaingin  . PELVIC LAPAROSCOPY     ovarian cyst removal,   . REVISION TOTAL HIP ARTHROPLASTY     left  . right Achilles Tendon repair  5/13  . TOTAL KNEE ARTHROPLASTY     right  . TOTAL KNEE ARTHROPLASTY     left  . VAGINAL HYSTERECTOMY     with ovarian cyst removal    Current Medications: Current Meds  Medication Sig  . aspirin 81 MG tablet Take 81 mg by mouth daily.  . Calcium Carbonate-Vit D-Min (CALCIUM 1200 PO) Take 1 tablet by mouth daily.   . carboxymethylcellulose (REFRESH TEARS) 0.5 % SOLN Place 1  drop into both eyes daily.   . carvedilol (COREG) 12.5 MG tablet Take 12.5 mg by mouth. 1/2 in the Am; 1 in the PM  . cholecalciferol (VITAMIN D) 1000 UNITS tablet Take 1,000 Units by mouth daily.  . clobetasol cream (TEMOVATE) [id number] % Apply 1 application topically 2 (two) times daily as needed (itching).   Marland Kitchen diltiazem (CARDIZEM CD) 240 MG 24 hr capsule Take 1 capsule (240 mg total) by mouth daily.  . meloxicam (MOBIC) 7.5 MG tablet Take 7.5 mg by mouth daily as needed for pain.   Marland Kitchen omega-3 acid ethyl esters (LOVAZA) 1 g capsule Take 1 g by mouth daily.   Marland Kitchen PREMARIN 0.3 MG tablet Take 1 tablet (0.3 mg total) by mouth daily.     Allergies:   Keflex [cephalexin], Shellfish allergy, Shellfish-derived  products, Clindamycin/lincomycin, Lincomycin, Penicillamine, Sugar-protein-starch, Tape, Ciprofloxacin, Sulfa antibiotics, and Sulfites   Social History   Socioeconomic History  . Marital status: Married    Spouse name: Not on file  . Number of children: Not on file  . Years of education: Not on file  . Highest education level: Not on file  Occupational History  . Not on file  Tobacco Use  . Smoking status: Former Smoker    Types: Cigarettes  . Smokeless tobacco: Never Used  . Tobacco comment: quit age 28  Substance and Sexual Activity  . Alcohol use: No    Alcohol/week: 0.0 standard drinks  . Drug use: No  . Sexual activity: Not Currently    Birth control/protection: Surgical    Comment: HYST-1st intercourse 86 yo-Fewer than 5 partners  Other Topics Concern  . Not on file  Social History Narrative  . Not on file   Social Determinants of Health   Financial Resource Strain:   . Difficulty of Paying Living Expenses: Not on file  Food Insecurity:   . Worried About Charity fundraiser in the Last Year: Not on file  . Ran Out of Food in the Last Year: Not on file  Transportation Needs:   . Lack of Transportation (Medical): Not on file  . Lack of Transportation (Non-Medical): Not on file  Physical Activity:   . Days of Exercise per Week: Not on file  . Minutes of Exercise per Session: Not on file  Stress:   . Feeling of Stress : Not on file  Social Connections:   . Frequency of Communication with Friends and Family: Not on file  . Frequency of Social Gatherings with Friends and Family: Not on file  . Attends Religious Services: Not on file  . Active Member of Clubs or Organizations: Not on file  . Attends Archivist Meetings: Not on file  . Marital Status: Not on file     Family History: The patient's family history includes ALS in an other family member; Heart attack (age of onset: 78) in her father; Hypertension in her mother; Stroke in her mother. There is  no history of Breast cancer.  ROS:   Please see the history of present illness.    She has back and knee pain.  She is dealing with this the best that she can.  She was having her aorta followed sequentially.  She decided to stop follow-up because she did not feel she would agree to an operation under any circumstance.  All other systems reviewed and are negative.  EKGs/Labs/Other Studies Reviewed:    The following studies were reviewed today: No new imaging within the past  several years.  Goal was 2017.  EKG:  EKG the electrocardiogram today demonstrates sinus rhythm with normal appearance poor R wave progression V1 through V4.  ST abnormality noted.  Recent Labs: No results found for requested labs within last 8760 hours.  Recent Lipid Panel No results found for: CHOL, TRIG, HDL, CHOLHDL, VLDL, LDLCALC, LDLDIRECT  Physical Exam:    VS:  BP 138/68   Pulse 73   Ht 5\' 4"  (1.626 m)   Wt 149 lb 6.4 oz (67.8 kg)   SpO2 97%   BMI 25.64 kg/m     Wt Readings from Last 3 Encounters:  05/22/19 149 lb 6.4 oz (67.8 kg)  10/10/18 147 lb (66.7 kg)  04/17/18 150 lb (68 kg)     GEN: Slender, no acute distress. No acute distress HEENT: Normal NECK: No JVD. LYMPHATICS: No lymphadenopathy CARDIAC: 2/6 systolic followed by a 1/6 to 2/6 decrescendo diastolic murmur without ejection click.  RRR, with no gallop, or edema. VASCULAR:  Normal Pulses. No bruits. RESPIRATORY:  Clear to auscultation without rales, wheezing or rhonchi  ABDOMEN: Soft, non-tender, non-distended, No pulsatile mass, MUSCULOSKELETAL: Scoliosis SKIN: Warm and dry NEUROLOGIC:  Alert and oriented x 3 PSYCHIATRIC:  Normal affect   ASSESSMENT:    1. Thoracic aortic aneurysm without rupture ([place])   2. Nonrheumatic aortic valve insufficiency   3. Essential hypertension   4. Educated about COVID-19 virus infection    PLAN:    In order of problems listed above:  1. This is not being actively followed. 2. Mild aortic  regurgitation is heard.  The last echo demonstrated mild aortic regurgitation.  Today's exam suggests the same degree. 3. Blood pressure is well controlled. 4. COVID-19 vaccine has been received.  Social distancing and mask wearing continues.   Medication Adjustments/Labs and Tests Ordered: Current medicines are reviewed at length with the patient today.  Concerns regarding medicines are outlined above.  Orders Placed This Encounter  Procedures  . EKG 12-Lead   No orders of the defined types were placed in this encounter.   Patient Instructions  Medication Instructions:  Your physician recommends that you continue on your current medications as directed. Please refer to the Current Medication list given to you today.  *If you need a refill on your cardiac medications before your next appointment, please call your pharmacy*   Lab Work: None If you have labs (blood work) drawn today and your tests are completely normal, you will receive your results only by: Marland Kitchen MyChart Message (if you have MyChart) OR . A paper copy in the mail If you have any lab test that is abnormal or we need to change your treatment, we will call you to review the results.   Testing/Procedures: None   Follow-Up: At [facility], you and your health needs are our priority.  As part of our continuing mission to provide you with exceptional heart care, we have created designated Provider Care Teams.  These Care Teams include your primary Cardiologist (physician) and Advanced Practice Providers (APPs -  Physician Assistants and Nurse Practitioners) who all work together to provide you with the care you need, when you need it.  We recommend signing up for the patient portal called "MyChart".  Sign up information is provided on this After Visit Summary.  MyChart is used to connect with patients for Virtual Visits (Telemedicine).  Patients are able to view lab/test results, encounter notes, upcoming appointments,  etc.  Non-urgent messages can be sent to your provider  as well.   To learn more about what you can do with MyChart, go to [email or web address].    Your next appointment:   12 month(s)  The format for your next appointment:   In Person  Provider:   You may see Sinclair Grooms, MD or one of the following Advanced Practice Providers on your designated Care Team:    Truitt Merle, NP  Cecilie Kicks, NP  Kathyrn Drown, NP    Other Instructions      Signed, Sinclair Grooms, MD  05/22/2019 3:35 PM    [place]

## 2019-05-22 ENCOUNTER — Other Ambulatory Visit: Payer: Self-pay

## 2019-05-22 ENCOUNTER — Encounter: Payer: Self-pay | Admitting: Interventional Cardiology

## 2019-05-22 ENCOUNTER — Ambulatory Visit (INDEPENDENT_AMBULATORY_CARE_PROVIDER_SITE_OTHER): Payer: Medicare Other | Admitting: Interventional Cardiology

## 2019-05-22 VITALS — BP 138/68 | HR 73 | Ht 64.0 in | Wt 149.4 lb

## 2019-05-22 DIAGNOSIS — I10 Essential (primary) hypertension: Secondary | ICD-10-CM | POA: Diagnosis not present

## 2019-05-22 DIAGNOSIS — Z7189 Other specified counseling: Secondary | ICD-10-CM | POA: Diagnosis not present

## 2019-05-22 DIAGNOSIS — I351 Nonrheumatic aortic (valve) insufficiency: Secondary | ICD-10-CM | POA: Diagnosis not present

## 2019-05-22 DIAGNOSIS — M9901 Segmental and somatic dysfunction of cervical region: Secondary | ICD-10-CM | POA: Diagnosis not present

## 2019-05-22 DIAGNOSIS — I712 Thoracic aortic aneurysm, without rupture, unspecified: Secondary | ICD-10-CM

## 2019-05-22 DIAGNOSIS — M47812 Spondylosis without myelopathy or radiculopathy, cervical region: Secondary | ICD-10-CM | POA: Diagnosis not present

## 2019-05-22 NOTE — Patient Instructions (Signed)

## 2019-05-29 DIAGNOSIS — L300 Nummular dermatitis: Secondary | ICD-10-CM | POA: Diagnosis not present

## 2019-05-29 DIAGNOSIS — Z85828 Personal history of other malignant neoplasm of skin: Secondary | ICD-10-CM | POA: Diagnosis not present

## 2019-06-05 ENCOUNTER — Other Ambulatory Visit: Payer: Self-pay

## 2019-06-05 ENCOUNTER — Other Ambulatory Visit: Payer: Self-pay | Admitting: Interventional Cardiology

## 2019-06-05 MED ORDER — DILTIAZEM HCL ER COATED BEADS 240 MG PO CP24: 240.0000 mg | ORAL_CAPSULE | Freq: Every day | ORAL | 3 refills | Status: DC

## 2019-06-05 NOTE — Addendum Note (Signed)
Addended by: Derl Barrow on: 06/05/2019 03:28 PM   Modules accepted: Orders

## 2019-06-06 DIAGNOSIS — M47812 Spondylosis without myelopathy or radiculopathy, cervical region: Secondary | ICD-10-CM | POA: Diagnosis not present

## 2019-06-06 DIAGNOSIS — M9901 Segmental and somatic dysfunction of cervical region: Secondary | ICD-10-CM | POA: Diagnosis not present

## 2019-06-14 DIAGNOSIS — Z471 Aftercare following joint replacement surgery: Secondary | ICD-10-CM | POA: Diagnosis not present

## 2019-06-14 DIAGNOSIS — Z96642 Presence of left artificial hip joint: Secondary | ICD-10-CM | POA: Diagnosis not present

## 2019-06-14 DIAGNOSIS — M5136 Other intervertebral disc degeneration, lumbar region: Secondary | ICD-10-CM | POA: Diagnosis not present

## 2019-06-17 DIAGNOSIS — M5416 Radiculopathy, lumbar region: Secondary | ICD-10-CM | POA: Insufficient documentation

## 2019-06-18 DIAGNOSIS — M4726 Other spondylosis with radiculopathy, lumbar region: Secondary | ICD-10-CM | POA: Diagnosis not present

## 2019-06-18 DIAGNOSIS — M9901 Segmental and somatic dysfunction of cervical region: Secondary | ICD-10-CM | POA: Diagnosis not present

## 2019-06-18 DIAGNOSIS — M9902 Segmental and somatic dysfunction of thoracic region: Secondary | ICD-10-CM | POA: Diagnosis not present

## 2019-06-18 DIAGNOSIS — M9903 Segmental and somatic dysfunction of lumbar region: Secondary | ICD-10-CM | POA: Diagnosis not present

## 2019-06-19 DIAGNOSIS — M9903 Segmental and somatic dysfunction of lumbar region: Secondary | ICD-10-CM | POA: Diagnosis not present

## 2019-06-19 DIAGNOSIS — M9901 Segmental and somatic dysfunction of cervical region: Secondary | ICD-10-CM | POA: Diagnosis not present

## 2019-06-19 DIAGNOSIS — M4726 Other spondylosis with radiculopathy, lumbar region: Secondary | ICD-10-CM | POA: Diagnosis not present

## 2019-06-19 DIAGNOSIS — M9902 Segmental and somatic dysfunction of thoracic region: Secondary | ICD-10-CM | POA: Diagnosis not present

## 2019-06-20 DIAGNOSIS — M9902 Segmental and somatic dysfunction of thoracic region: Secondary | ICD-10-CM | POA: Diagnosis not present

## 2019-06-20 DIAGNOSIS — M9901 Segmental and somatic dysfunction of cervical region: Secondary | ICD-10-CM | POA: Diagnosis not present

## 2019-06-20 DIAGNOSIS — M4726 Other spondylosis with radiculopathy, lumbar region: Secondary | ICD-10-CM | POA: Diagnosis not present

## 2019-06-20 DIAGNOSIS — M9903 Segmental and somatic dysfunction of lumbar region: Secondary | ICD-10-CM | POA: Diagnosis not present

## 2019-06-21 DIAGNOSIS — H353132 Nonexudative age-related macular degeneration, bilateral, intermediate dry stage: Secondary | ICD-10-CM | POA: Diagnosis not present

## 2019-06-21 DIAGNOSIS — Z961 Presence of intraocular lens: Secondary | ICD-10-CM | POA: Diagnosis not present

## 2019-06-21 DIAGNOSIS — H43813 Vitreous degeneration, bilateral: Secondary | ICD-10-CM | POA: Diagnosis not present

## 2019-06-21 DIAGNOSIS — H26491 Other secondary cataract, right eye: Secondary | ICD-10-CM | POA: Diagnosis not present

## 2019-06-25 DIAGNOSIS — M9903 Segmental and somatic dysfunction of lumbar region: Secondary | ICD-10-CM | POA: Diagnosis not present

## 2019-06-25 DIAGNOSIS — M4726 Other spondylosis with radiculopathy, lumbar region: Secondary | ICD-10-CM | POA: Diagnosis not present

## 2019-06-25 DIAGNOSIS — M9902 Segmental and somatic dysfunction of thoracic region: Secondary | ICD-10-CM | POA: Diagnosis not present

## 2019-06-25 DIAGNOSIS — M9901 Segmental and somatic dysfunction of cervical region: Secondary | ICD-10-CM | POA: Diagnosis not present

## 2019-06-26 DIAGNOSIS — M9903 Segmental and somatic dysfunction of lumbar region: Secondary | ICD-10-CM | POA: Diagnosis not present

## 2019-06-26 DIAGNOSIS — M9901 Segmental and somatic dysfunction of cervical region: Secondary | ICD-10-CM | POA: Diagnosis not present

## 2019-06-26 DIAGNOSIS — M9902 Segmental and somatic dysfunction of thoracic region: Secondary | ICD-10-CM | POA: Diagnosis not present

## 2019-06-26 DIAGNOSIS — M4726 Other spondylosis with radiculopathy, lumbar region: Secondary | ICD-10-CM | POA: Diagnosis not present

## 2019-07-02 DIAGNOSIS — M9901 Segmental and somatic dysfunction of cervical region: Secondary | ICD-10-CM | POA: Diagnosis not present

## 2019-07-02 DIAGNOSIS — M9903 Segmental and somatic dysfunction of lumbar region: Secondary | ICD-10-CM | POA: Diagnosis not present

## 2019-07-02 DIAGNOSIS — M9902 Segmental and somatic dysfunction of thoracic region: Secondary | ICD-10-CM | POA: Diagnosis not present

## 2019-07-02 DIAGNOSIS — M4726 Other spondylosis with radiculopathy, lumbar region: Secondary | ICD-10-CM | POA: Diagnosis not present

## 2019-07-04 DIAGNOSIS — M4726 Other spondylosis with radiculopathy, lumbar region: Secondary | ICD-10-CM | POA: Diagnosis not present

## 2019-07-04 DIAGNOSIS — M9903 Segmental and somatic dysfunction of lumbar region: Secondary | ICD-10-CM | POA: Diagnosis not present

## 2019-07-04 DIAGNOSIS — M9902 Segmental and somatic dysfunction of thoracic region: Secondary | ICD-10-CM | POA: Diagnosis not present

## 2019-07-04 DIAGNOSIS — M9901 Segmental and somatic dysfunction of cervical region: Secondary | ICD-10-CM | POA: Diagnosis not present

## 2019-07-06 DIAGNOSIS — R197 Diarrhea, unspecified: Secondary | ICD-10-CM | POA: Diagnosis not present

## 2019-07-09 DIAGNOSIS — M4726 Other spondylosis with radiculopathy, lumbar region: Secondary | ICD-10-CM | POA: Diagnosis not present

## 2019-07-09 DIAGNOSIS — M9903 Segmental and somatic dysfunction of lumbar region: Secondary | ICD-10-CM | POA: Diagnosis not present

## 2019-07-09 DIAGNOSIS — M9901 Segmental and somatic dysfunction of cervical region: Secondary | ICD-10-CM | POA: Diagnosis not present

## 2019-07-09 DIAGNOSIS — M9902 Segmental and somatic dysfunction of thoracic region: Secondary | ICD-10-CM | POA: Diagnosis not present

## 2019-07-16 DIAGNOSIS — M9903 Segmental and somatic dysfunction of lumbar region: Secondary | ICD-10-CM | POA: Diagnosis not present

## 2019-07-16 DIAGNOSIS — M9901 Segmental and somatic dysfunction of cervical region: Secondary | ICD-10-CM | POA: Diagnosis not present

## 2019-07-16 DIAGNOSIS — M4726 Other spondylosis with radiculopathy, lumbar region: Secondary | ICD-10-CM | POA: Diagnosis not present

## 2019-07-16 DIAGNOSIS — M9902 Segmental and somatic dysfunction of thoracic region: Secondary | ICD-10-CM | POA: Diagnosis not present

## 2019-07-18 DIAGNOSIS — M9901 Segmental and somatic dysfunction of cervical region: Secondary | ICD-10-CM | POA: Diagnosis not present

## 2019-07-18 DIAGNOSIS — M9902 Segmental and somatic dysfunction of thoracic region: Secondary | ICD-10-CM | POA: Diagnosis not present

## 2019-07-18 DIAGNOSIS — M9903 Segmental and somatic dysfunction of lumbar region: Secondary | ICD-10-CM | POA: Diagnosis not present

## 2019-07-18 DIAGNOSIS — M4726 Other spondylosis with radiculopathy, lumbar region: Secondary | ICD-10-CM | POA: Diagnosis not present

## 2019-07-20 DIAGNOSIS — R197 Diarrhea, unspecified: Secondary | ICD-10-CM | POA: Diagnosis not present

## 2019-07-20 DIAGNOSIS — I10 Essential (primary) hypertension: Secondary | ICD-10-CM | POA: Diagnosis not present

## 2019-07-23 DIAGNOSIS — M9903 Segmental and somatic dysfunction of lumbar region: Secondary | ICD-10-CM | POA: Diagnosis not present

## 2019-07-23 DIAGNOSIS — M9901 Segmental and somatic dysfunction of cervical region: Secondary | ICD-10-CM | POA: Diagnosis not present

## 2019-07-23 DIAGNOSIS — M9902 Segmental and somatic dysfunction of thoracic region: Secondary | ICD-10-CM | POA: Diagnosis not present

## 2019-07-23 DIAGNOSIS — M4726 Other spondylosis with radiculopathy, lumbar region: Secondary | ICD-10-CM | POA: Diagnosis not present

## 2019-07-25 DIAGNOSIS — R197 Diarrhea, unspecified: Secondary | ICD-10-CM | POA: Diagnosis not present

## 2019-07-26 DIAGNOSIS — R197 Diarrhea, unspecified: Secondary | ICD-10-CM | POA: Diagnosis not present

## 2019-07-30 DIAGNOSIS — M9901 Segmental and somatic dysfunction of cervical region: Secondary | ICD-10-CM | POA: Diagnosis not present

## 2019-07-30 DIAGNOSIS — M9903 Segmental and somatic dysfunction of lumbar region: Secondary | ICD-10-CM | POA: Diagnosis not present

## 2019-07-30 DIAGNOSIS — M9902 Segmental and somatic dysfunction of thoracic region: Secondary | ICD-10-CM | POA: Diagnosis not present

## 2019-07-30 DIAGNOSIS — M4726 Other spondylosis with radiculopathy, lumbar region: Secondary | ICD-10-CM | POA: Diagnosis not present

## 2019-07-31 DIAGNOSIS — Z85828 Personal history of other malignant neoplasm of skin: Secondary | ICD-10-CM | POA: Diagnosis not present

## 2019-07-31 DIAGNOSIS — L300 Nummular dermatitis: Secondary | ICD-10-CM | POA: Diagnosis not present

## 2019-07-31 DIAGNOSIS — L282 Other prurigo: Secondary | ICD-10-CM | POA: Diagnosis not present

## 2019-08-06 DIAGNOSIS — M4726 Other spondylosis with radiculopathy, lumbar region: Secondary | ICD-10-CM | POA: Diagnosis not present

## 2019-08-06 DIAGNOSIS — M9901 Segmental and somatic dysfunction of cervical region: Secondary | ICD-10-CM | POA: Diagnosis not present

## 2019-08-06 DIAGNOSIS — M9902 Segmental and somatic dysfunction of thoracic region: Secondary | ICD-10-CM | POA: Diagnosis not present

## 2019-08-06 DIAGNOSIS — M9903 Segmental and somatic dysfunction of lumbar region: Secondary | ICD-10-CM | POA: Diagnosis not present

## 2019-08-14 DIAGNOSIS — S0101XA Laceration without foreign body of scalp, initial encounter: Secondary | ICD-10-CM | POA: Diagnosis not present

## 2019-08-14 DIAGNOSIS — S0003XA Contusion of scalp, initial encounter: Secondary | ICD-10-CM | POA: Diagnosis not present

## 2019-08-20 DIAGNOSIS — M9902 Segmental and somatic dysfunction of thoracic region: Secondary | ICD-10-CM | POA: Diagnosis not present

## 2019-08-20 DIAGNOSIS — M9903 Segmental and somatic dysfunction of lumbar region: Secondary | ICD-10-CM | POA: Diagnosis not present

## 2019-08-20 DIAGNOSIS — M9901 Segmental and somatic dysfunction of cervical region: Secondary | ICD-10-CM | POA: Diagnosis not present

## 2019-08-20 DIAGNOSIS — M4726 Other spondylosis with radiculopathy, lumbar region: Secondary | ICD-10-CM | POA: Diagnosis not present

## 2019-08-28 DIAGNOSIS — E871 Hypo-osmolality and hyponatremia: Secondary | ICD-10-CM | POA: Diagnosis not present

## 2019-08-28 DIAGNOSIS — R946 Abnormal results of thyroid function studies: Secondary | ICD-10-CM | POA: Diagnosis not present

## 2019-09-13 DIAGNOSIS — R5383 Other fatigue: Secondary | ICD-10-CM | POA: Diagnosis not present

## 2019-09-13 DIAGNOSIS — K529 Noninfective gastroenteritis and colitis, unspecified: Secondary | ICD-10-CM | POA: Diagnosis not present

## 2019-09-18 ENCOUNTER — Ambulatory Visit (INDEPENDENT_AMBULATORY_CARE_PROVIDER_SITE_OTHER): Payer: Medicare Other | Admitting: Podiatry

## 2019-09-18 ENCOUNTER — Encounter: Payer: Self-pay | Admitting: Podiatry

## 2019-09-18 ENCOUNTER — Other Ambulatory Visit: Payer: Self-pay

## 2019-09-18 DIAGNOSIS — M2031 Hallux varus (acquired), right foot: Secondary | ICD-10-CM

## 2019-09-18 DIAGNOSIS — M79675 Pain in left toe(s): Secondary | ICD-10-CM | POA: Diagnosis not present

## 2019-09-18 DIAGNOSIS — Q828 Other specified congenital malformations of skin: Secondary | ICD-10-CM

## 2019-09-18 DIAGNOSIS — B351 Tinea unguium: Secondary | ICD-10-CM

## 2019-09-18 DIAGNOSIS — M79674 Pain in right toe(s): Secondary | ICD-10-CM

## 2019-09-18 NOTE — Progress Notes (Signed)
This patient returns to the office for evaluation and treatment of long thick painful nails .  This patient is unable to trim his own nails since the patient cannot reach his feet.  Patient says the nails are painful walking and wearing his shoes.  He returns for preventive foot care services.  General Appearance  Alert, conversant and in no acute stress.  Vascular  Dorsalis pedis and posterior tibial  pulses are palpable  bilaterally.  Capillary return is within normal limits  bilaterally. Temperature is within normal limits  bilaterally.  Neurologic  Senn-Weinstein monofilament wire test within normal limits  bilaterally. Muscle power within normal limits bilaterally.  Nails Thick disfigured discolored nails with subungual debris  from hallux to fifth toes bilaterally. No evidence of bacterial infection or drainage bilaterally.  Orthopedic  No limitations of motion  feet .  No crepitus or effusions noted.  No bony pathology or digital deformities noted. 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  Skin  normotropic  bilaterally.  No signs of infections or ulcers noted.   Asymptomatic callus right hallux.  Onychomycosis  Pain in toes right foot  Pain in toes left foot  Debridement  of nails  1-5  B/L with a nail nipper.  Nails were then filed using a dremel tool with no incidents.     RTC 4 months    [name]  

## 2019-09-20 ENCOUNTER — Other Ambulatory Visit: Payer: Self-pay | Admitting: Internal Medicine

## 2019-09-20 DIAGNOSIS — Z1231 Encounter for screening mammogram for malignant neoplasm of breast: Secondary | ICD-10-CM

## 2019-10-02 DIAGNOSIS — M9901 Segmental and somatic dysfunction of cervical region: Secondary | ICD-10-CM | POA: Diagnosis not present

## 2019-10-02 DIAGNOSIS — M9903 Segmental and somatic dysfunction of lumbar region: Secondary | ICD-10-CM | POA: Diagnosis not present

## 2019-10-02 DIAGNOSIS — M9902 Segmental and somatic dysfunction of thoracic region: Secondary | ICD-10-CM | POA: Diagnosis not present

## 2019-10-02 DIAGNOSIS — M4726 Other spondylosis with radiculopathy, lumbar region: Secondary | ICD-10-CM | POA: Diagnosis not present

## 2019-10-15 DIAGNOSIS — M9903 Segmental and somatic dysfunction of lumbar region: Secondary | ICD-10-CM | POA: Diagnosis not present

## 2019-10-15 DIAGNOSIS — M4726 Other spondylosis with radiculopathy, lumbar region: Secondary | ICD-10-CM | POA: Diagnosis not present

## 2019-10-15 DIAGNOSIS — M9901 Segmental and somatic dysfunction of cervical region: Secondary | ICD-10-CM | POA: Diagnosis not present

## 2019-10-15 DIAGNOSIS — M9902 Segmental and somatic dysfunction of thoracic region: Secondary | ICD-10-CM | POA: Diagnosis not present

## 2019-10-16 DIAGNOSIS — N3946 Mixed incontinence: Secondary | ICD-10-CM | POA: Diagnosis not present

## 2019-10-16 DIAGNOSIS — Z87440 Personal history of urinary (tract) infections: Secondary | ICD-10-CM | POA: Diagnosis not present

## 2019-10-16 DIAGNOSIS — R809 Proteinuria, unspecified: Secondary | ICD-10-CM | POA: Diagnosis not present

## 2019-10-19 DIAGNOSIS — R2681 Unsteadiness on feet: Secondary | ICD-10-CM | POA: Diagnosis not present

## 2019-10-19 DIAGNOSIS — M6281 Muscle weakness (generalized): Secondary | ICD-10-CM | POA: Diagnosis not present

## 2019-10-22 ENCOUNTER — Ambulatory Visit
Admission: RE | Admit: 2019-10-22 | Discharge: 2019-10-22 | Disposition: A | Discharge status: Home / Self Care | Payer: Medicare Other | Source: Ambulatory Visit | Attending: Internal Medicine | Admitting: Internal Medicine

## 2019-10-22 ENCOUNTER — Other Ambulatory Visit: Payer: Self-pay

## 2019-10-22 DIAGNOSIS — Z1231 Encounter for screening mammogram for malignant neoplasm of breast: Secondary | ICD-10-CM

## 2019-10-24 DIAGNOSIS — M6281 Muscle weakness (generalized): Secondary | ICD-10-CM | POA: Diagnosis not present

## 2019-10-24 DIAGNOSIS — R2681 Unsteadiness on feet: Secondary | ICD-10-CM | POA: Diagnosis not present

## 2019-10-29 DIAGNOSIS — M4726 Other spondylosis with radiculopathy, lumbar region: Secondary | ICD-10-CM | POA: Diagnosis not present

## 2019-10-29 DIAGNOSIS — M9902 Segmental and somatic dysfunction of thoracic region: Secondary | ICD-10-CM | POA: Diagnosis not present

## 2019-10-29 DIAGNOSIS — M9903 Segmental and somatic dysfunction of lumbar region: Secondary | ICD-10-CM | POA: Diagnosis not present

## 2019-10-29 DIAGNOSIS — M9901 Segmental and somatic dysfunction of cervical region: Secondary | ICD-10-CM | POA: Diagnosis not present

## 2019-11-01 DIAGNOSIS — M6281 Muscle weakness (generalized): Secondary | ICD-10-CM | POA: Diagnosis not present

## 2019-11-01 DIAGNOSIS — R2681 Unsteadiness on feet: Secondary | ICD-10-CM | POA: Diagnosis not present

## 2019-11-05 DIAGNOSIS — R2681 Unsteadiness on feet: Secondary | ICD-10-CM | POA: Diagnosis not present

## 2019-11-05 DIAGNOSIS — M6281 Muscle weakness (generalized): Secondary | ICD-10-CM | POA: Diagnosis not present

## 2019-11-07 DIAGNOSIS — M6281 Muscle weakness (generalized): Secondary | ICD-10-CM | POA: Diagnosis not present

## 2019-11-07 DIAGNOSIS — R2681 Unsteadiness on feet: Secondary | ICD-10-CM | POA: Diagnosis not present

## 2019-11-12 DIAGNOSIS — M9902 Segmental and somatic dysfunction of thoracic region: Secondary | ICD-10-CM | POA: Diagnosis not present

## 2019-11-12 DIAGNOSIS — M4726 Other spondylosis with radiculopathy, lumbar region: Secondary | ICD-10-CM | POA: Diagnosis not present

## 2019-11-12 DIAGNOSIS — M9903 Segmental and somatic dysfunction of lumbar region: Secondary | ICD-10-CM | POA: Diagnosis not present

## 2019-11-12 DIAGNOSIS — Z23 Encounter for immunization: Secondary | ICD-10-CM | POA: Diagnosis not present

## 2019-11-12 DIAGNOSIS — M9901 Segmental and somatic dysfunction of cervical region: Secondary | ICD-10-CM | POA: Diagnosis not present

## 2019-11-14 DIAGNOSIS — R2681 Unsteadiness on feet: Secondary | ICD-10-CM | POA: Diagnosis not present

## 2019-11-14 DIAGNOSIS — M6281 Muscle weakness (generalized): Secondary | ICD-10-CM | POA: Diagnosis not present

## 2019-11-20 ENCOUNTER — Ambulatory Visit (INDEPENDENT_AMBULATORY_CARE_PROVIDER_SITE_OTHER): Payer: Medicare Other | Admitting: Podiatry

## 2019-11-20 ENCOUNTER — Other Ambulatory Visit: Payer: Self-pay

## 2019-11-20 ENCOUNTER — Encounter: Payer: Self-pay | Admitting: Podiatry

## 2019-11-20 DIAGNOSIS — B351 Tinea unguium: Secondary | ICD-10-CM | POA: Diagnosis not present

## 2019-11-20 DIAGNOSIS — M79674 Pain in right toe(s): Secondary | ICD-10-CM | POA: Diagnosis not present

## 2019-11-20 DIAGNOSIS — M79675 Pain in left toe(s): Secondary | ICD-10-CM | POA: Diagnosis not present

## 2019-11-20 DIAGNOSIS — M2031 Hallux varus (acquired), right foot: Secondary | ICD-10-CM

## 2019-11-20 NOTE — Progress Notes (Signed)
This patient returns to the office for evaluation and treatment of long thick painful nails .  This patient is unable to trim his own nails since the patient cannot reach his feet.  Patient says the nails are painful walking and wearing his shoes.  He returns for preventive foot care services.  General Appearance  Alert, conversant and in no acute stress.  Vascular  Dorsalis pedis and posterior tibial  pulses are palpable  bilaterally.  Capillary return is within normal limits  bilaterally. Temperature is within normal limits  bilaterally.  Neurologic  Senn-Weinstein monofilament wire test within normal limits  bilaterally. Muscle power within normal limits bilaterally.  Nails Thick disfigured discolored nails with subungual debris  from hallux to fifth toes bilaterally. No evidence of bacterial infection or drainage bilaterally.  Orthopedic  No limitations of motion  feet .  No crepitus or effusions noted.  No bony pathology or digital deformities noted. 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  Skin  normotropic  bilaterally.  No signs of infections or ulcers noted.   Asymptomatic callus right hallux.  Onychomycosis  Pain in toes right foot  Pain in toes left foot  Debridement  of nails  1-5  B/L with a nail nipper.  Nails were then filed using a dremel tool with no incidents.     RTC 4 months    Gardiner Barefoot DPM

## 2019-11-22 DIAGNOSIS — M6281 Muscle weakness (generalized): Secondary | ICD-10-CM | POA: Diagnosis not present

## 2019-11-22 DIAGNOSIS — R2681 Unsteadiness on feet: Secondary | ICD-10-CM | POA: Diagnosis not present

## 2019-11-23 DIAGNOSIS — R197 Diarrhea, unspecified: Secondary | ICD-10-CM | POA: Diagnosis not present

## 2019-11-23 DIAGNOSIS — D649 Anemia, unspecified: Secondary | ICD-10-CM | POA: Diagnosis not present

## 2019-11-23 DIAGNOSIS — K529 Noninfective gastroenteritis and colitis, unspecified: Secondary | ICD-10-CM | POA: Diagnosis not present

## 2019-11-23 DIAGNOSIS — K580 Irritable bowel syndrome with diarrhea: Secondary | ICD-10-CM | POA: Diagnosis not present

## 2019-11-26 DIAGNOSIS — M9902 Segmental and somatic dysfunction of thoracic region: Secondary | ICD-10-CM | POA: Diagnosis not present

## 2019-11-26 DIAGNOSIS — M9901 Segmental and somatic dysfunction of cervical region: Secondary | ICD-10-CM | POA: Diagnosis not present

## 2019-11-26 DIAGNOSIS — M4726 Other spondylosis with radiculopathy, lumbar region: Secondary | ICD-10-CM | POA: Diagnosis not present

## 2019-11-26 DIAGNOSIS — M9903 Segmental and somatic dysfunction of lumbar region: Secondary | ICD-10-CM | POA: Diagnosis not present

## 2019-12-10 DIAGNOSIS — M9903 Segmental and somatic dysfunction of lumbar region: Secondary | ICD-10-CM | POA: Diagnosis not present

## 2019-12-10 DIAGNOSIS — M4726 Other spondylosis with radiculopathy, lumbar region: Secondary | ICD-10-CM | POA: Diagnosis not present

## 2019-12-10 DIAGNOSIS — M9902 Segmental and somatic dysfunction of thoracic region: Secondary | ICD-10-CM | POA: Diagnosis not present

## 2019-12-10 DIAGNOSIS — M9901 Segmental and somatic dysfunction of cervical region: Secondary | ICD-10-CM | POA: Diagnosis not present

## 2019-12-12 ENCOUNTER — Encounter: Payer: Self-pay | Admitting: Obstetrics and Gynecology

## 2019-12-12 ENCOUNTER — Ambulatory Visit (INDEPENDENT_AMBULATORY_CARE_PROVIDER_SITE_OTHER): Payer: Medicare Other | Admitting: Obstetrics and Gynecology

## 2019-12-12 ENCOUNTER — Other Ambulatory Visit: Payer: Self-pay

## 2019-12-12 VITALS — BP 146/82 | Ht 64.0 in | Wt 150.0 lb

## 2019-12-12 DIAGNOSIS — Z79890 Hormone replacement therapy (postmenopausal): Secondary | ICD-10-CM | POA: Diagnosis not present

## 2019-12-12 DIAGNOSIS — Z01419 Encounter for gynecological examination (general) (routine) without abnormal findings: Secondary | ICD-10-CM | POA: Diagnosis not present

## 2019-12-12 MED ORDER — PREMARIN 0.3 MG PO TABS: 0.3000 mg | ORAL_TABLET | Freq: Every day | ORAL | 3 refills | Status: DC

## 2019-12-12 NOTE — Progress Notes (Signed)
[name] [birth year] [id number]  SUBJECTIVE:  86 y.o. G2P2 female here for a breast and pelvic exam. She has no gynecologic concerns.  Current Outpatient Medications  Medication Sig Dispense Refill  . aspirin 81 MG tablet Take 81 mg by mouth daily.    Marland Kitchen augmented betamethasone dipropionate (DIPROLENE-AF) 0.05 % cream Apply 1 application topically 2 (two) times daily.    . Calcium Carbonate-Vit D-Min (CALCIUM 1200 PO) Take 1 tablet by mouth daily.     . carboxymethylcellulose (REFRESH TEARS) 0.5 % SOLN Place 1 drop into both eyes daily.     . carvedilol (COREG) 12.5 MG tablet Take 12.5 mg by mouth. 1/2 in the Am; 1 in the PM    . cholecalciferol (VITAMIN D) 1000 UNITS tablet Take 1,000 Units by mouth daily.    . clobetasol cream (TEMOVATE) 5.00 % Apply 1 application topically 2 (two) times daily as needed (itching).     Marland Kitchen diltiazem (CARDIZEM CD) 240 MG 24 hr capsule Take 1 capsule (240 mg total) by mouth daily. 90 capsule 3  . meloxicam (MOBIC) 7.5 MG tablet Take 7.5 mg by mouth daily as needed for pain.     Marland Kitchen omega-3 acid ethyl esters (LOVAZA) 1 g capsule Take 1 g by mouth daily.     Marland Kitchen PREMARIN 0.3 MG tablet Take 1 tablet (0.3 mg total) by mouth daily. 90 tablet 1  . sodium chloride 1 g tablet Take 1 g by mouth daily.    . colestipol (COLESTID) 1 g tablet Take 2 g by mouth 2 (two) times daily as needed. (Patient not taking: Reported on 12/12/2019)     No current facility-administered medications for this visit.   Allergies: Keflex [cephalexin], Shellfish allergy, Shellfish-derived products, Clindamycin/lincomycin, Lincomycin, Penicillamine, Sugar-protein-starch, Tape, Ciprofloxacin, Sulfa antibiotics, and Sulfites  No LMP recorded. Patient has had a hysterectomy.  Past medical history,surgical history, problem list, medications, allergies, family history and social history were all reviewed and documented as reviewed in the EPIC chart.  GYN ROS: no abnormal bleeding, pelvic  pain or discharge, no breast pain or new or enlarging lumps on self exam.  No dysuria, frequency, burning, pain with urination, cloudy/malodorous urine.   OBJECTIVE:  BP (!) 146/82   Ht 5\' 4"  (1.626 m)   Wt 150 lb (68 kg)   BMI 25.75 kg/m  The patient appears well, alert, oriented, in no distress.  BREAST EXAM: breasts appear normal, no suspicious masses, no skin or nipple changes or axillary nodes  PELVIC EXAM: VULVA: normal appearing vulva with atrophic change, no masses, tenderness or lesions, VAGINA: normal appearing vagina with atrophic change, normal color and discharge, no lesions, CERVIX: surgically absent, UTERUS: surgically absent, vaginal cuff normal, ADNEXA: no masses, nontender  Chaperone: Caryn Bee present during the examination  ASSESSMENT:  86 y.o. G2P2 here for a breast and pelvic exam  PLAN:   1. Postmenopausal/HRT.  She does continue on Premarin 0.3 mg daily.  She knows that it is atypical for a patient well into her 80s to continue on HRT.  Risks versus benefits discussed, including the risks of thrombotic diseases such as heart attack, stroke, DVT, PE, and the breast cancer issue.  Her standing that she indicates she definitely wants to continue as she does not want to risk not feeling well without the HRT.  Refill x1 year is provided at her request. 2. Pap smear 2012.  No significant history of abnormal Pap smears.  She has agreed to not continue with Pap  smear screening. 3. Mammogram 10/2019.  Normal breast exam today.  Continue with annual mammograms. 4. Colonoscopy 12 years ago.  She will follow up at the interval recommended by her GI specialist/PCP.   5. DEXA 2016 was normal.  She will plan to get another DEXA when she gets her next mammogram at the imaging center as that is where she has had the DEXA done in the past.  6. Health maintenance.  Mild elevation of BP today.  She will keep track of her BP periodically at home, and if persistently elevated, will  notify her PCP.  No labs today as she normally has these completed elsewhere.  Return annually or sooner, prn.  Joseph Pierini MD 12/12/19

## 2019-12-23 ENCOUNTER — Other Ambulatory Visit: Payer: Self-pay | Admitting: Interventional Cardiology

## 2019-12-24 DIAGNOSIS — K5289 Other specified noninfective gastroenteritis and colitis: Secondary | ICD-10-CM | POA: Diagnosis not present

## 2019-12-25 DIAGNOSIS — M9901 Segmental and somatic dysfunction of cervical region: Secondary | ICD-10-CM | POA: Diagnosis not present

## 2019-12-25 DIAGNOSIS — M9902 Segmental and somatic dysfunction of thoracic region: Secondary | ICD-10-CM | POA: Diagnosis not present

## 2019-12-25 DIAGNOSIS — M4726 Other spondylosis with radiculopathy, lumbar region: Secondary | ICD-10-CM | POA: Diagnosis not present

## 2019-12-25 DIAGNOSIS — M9903 Segmental and somatic dysfunction of lumbar region: Secondary | ICD-10-CM | POA: Diagnosis not present

## 2019-12-26 MED ORDER — DILTIAZEM HCL ER COATED BEADS 240 MG PO CP24: 240.0000 mg | ORAL_CAPSULE | Freq: Every day | ORAL | 3 refills | Status: DC

## 2019-12-26 NOTE — Addendum Note (Signed)
Addended by: Gaetano Net on: 12/26/2019 09:17 AM   Modules accepted: Orders

## 2020-01-01 DIAGNOSIS — I8311 Varicose veins of right lower extremity with inflammation: Secondary | ICD-10-CM | POA: Diagnosis not present

## 2020-01-01 DIAGNOSIS — I8312 Varicose veins of left lower extremity with inflammation: Secondary | ICD-10-CM | POA: Diagnosis not present

## 2020-01-01 DIAGNOSIS — Z85828 Personal history of other malignant neoplasm of skin: Secondary | ICD-10-CM | POA: Diagnosis not present

## 2020-01-01 DIAGNOSIS — I872 Venous insufficiency (chronic) (peripheral): Secondary | ICD-10-CM | POA: Diagnosis not present

## 2020-01-03 DIAGNOSIS — R399 Unspecified symptoms and signs involving the genitourinary system: Secondary | ICD-10-CM | POA: Diagnosis not present

## 2020-01-07 DIAGNOSIS — R5382 Chronic fatigue, unspecified: Secondary | ICD-10-CM | POA: Diagnosis not present

## 2020-01-14 DIAGNOSIS — M4726 Other spondylosis with radiculopathy, lumbar region: Secondary | ICD-10-CM | POA: Diagnosis not present

## 2020-01-14 DIAGNOSIS — M9902 Segmental and somatic dysfunction of thoracic region: Secondary | ICD-10-CM | POA: Diagnosis not present

## 2020-01-14 DIAGNOSIS — M9901 Segmental and somatic dysfunction of cervical region: Secondary | ICD-10-CM | POA: Diagnosis not present

## 2020-01-14 DIAGNOSIS — M9903 Segmental and somatic dysfunction of lumbar region: Secondary | ICD-10-CM | POA: Diagnosis not present

## 2020-01-16 DIAGNOSIS — Z0000 Encounter for general adult medical examination without abnormal findings: Secondary | ICD-10-CM | POA: Diagnosis not present

## 2020-01-16 DIAGNOSIS — E871 Hypo-osmolality and hyponatremia: Secondary | ICD-10-CM | POA: Diagnosis not present

## 2020-01-16 DIAGNOSIS — M25531 Pain in right wrist: Secondary | ICD-10-CM | POA: Diagnosis not present

## 2020-01-16 DIAGNOSIS — I10 Essential (primary) hypertension: Secondary | ICD-10-CM | POA: Diagnosis not present

## 2020-01-16 DIAGNOSIS — M25532 Pain in left wrist: Secondary | ICD-10-CM | POA: Diagnosis not present

## 2020-01-16 DIAGNOSIS — I712 Thoracic aortic aneurysm, without rupture: Secondary | ICD-10-CM | POA: Diagnosis not present

## 2020-01-16 DIAGNOSIS — N1832 Chronic kidney disease, stage 3b: Secondary | ICD-10-CM | POA: Diagnosis not present

## 2020-01-16 DIAGNOSIS — K5289 Other specified noninfective gastroenteritis and colitis: Secondary | ICD-10-CM | POA: Diagnosis not present

## 2020-01-22 DIAGNOSIS — M19042 Primary osteoarthritis, left hand: Secondary | ICD-10-CM | POA: Diagnosis not present

## 2020-01-22 DIAGNOSIS — M19041 Primary osteoarthritis, right hand: Secondary | ICD-10-CM | POA: Diagnosis not present

## 2020-01-23 DIAGNOSIS — M2550 Pain in unspecified joint: Secondary | ICD-10-CM | POA: Diagnosis not present

## 2020-01-23 DIAGNOSIS — E871 Hypo-osmolality and hyponatremia: Secondary | ICD-10-CM | POA: Diagnosis not present

## 2020-01-23 DIAGNOSIS — R5383 Other fatigue: Secondary | ICD-10-CM | POA: Diagnosis not present

## 2020-01-28 DIAGNOSIS — M9902 Segmental and somatic dysfunction of thoracic region: Secondary | ICD-10-CM | POA: Diagnosis not present

## 2020-01-28 DIAGNOSIS — M9901 Segmental and somatic dysfunction of cervical region: Secondary | ICD-10-CM | POA: Diagnosis not present

## 2020-01-28 DIAGNOSIS — M9903 Segmental and somatic dysfunction of lumbar region: Secondary | ICD-10-CM | POA: Diagnosis not present

## 2020-01-28 DIAGNOSIS — M4726 Other spondylosis with radiculopathy, lumbar region: Secondary | ICD-10-CM | POA: Diagnosis not present

## 2020-01-31 DIAGNOSIS — N390 Urinary tract infection, site not specified: Secondary | ICD-10-CM | POA: Diagnosis not present

## 2020-01-31 DIAGNOSIS — N3946 Mixed incontinence: Secondary | ICD-10-CM | POA: Diagnosis not present

## 2020-02-13 DIAGNOSIS — M9902 Segmental and somatic dysfunction of thoracic region: Secondary | ICD-10-CM | POA: Diagnosis not present

## 2020-02-13 DIAGNOSIS — M9901 Segmental and somatic dysfunction of cervical region: Secondary | ICD-10-CM | POA: Diagnosis not present

## 2020-02-13 DIAGNOSIS — M9903 Segmental and somatic dysfunction of lumbar region: Secondary | ICD-10-CM | POA: Diagnosis not present

## 2020-02-13 DIAGNOSIS — M4726 Other spondylosis with radiculopathy, lumbar region: Secondary | ICD-10-CM | POA: Diagnosis not present

## 2020-02-19 ENCOUNTER — Encounter: Payer: Self-pay | Admitting: Podiatry

## 2020-02-19 ENCOUNTER — Ambulatory Visit (INDEPENDENT_AMBULATORY_CARE_PROVIDER_SITE_OTHER): Payer: Medicare Other | Admitting: Podiatry

## 2020-02-19 ENCOUNTER — Other Ambulatory Visit: Payer: Self-pay

## 2020-02-19 DIAGNOSIS — M79675 Pain in left toe(s): Secondary | ICD-10-CM

## 2020-02-19 DIAGNOSIS — L84 Corns and callosities: Secondary | ICD-10-CM | POA: Diagnosis not present

## 2020-02-19 DIAGNOSIS — M2031 Hallux varus (acquired), right foot: Secondary | ICD-10-CM

## 2020-02-19 DIAGNOSIS — B351 Tinea unguium: Secondary | ICD-10-CM | POA: Diagnosis not present

## 2020-02-19 DIAGNOSIS — M79674 Pain in right toe(s): Secondary | ICD-10-CM

## 2020-02-19 NOTE — Progress Notes (Signed)
This patient returns to the office for evaluation and treatment of long thick painful nails .  This patient is unable to trim his own nails since the patient cannot reach his feet.  Patient says the nails are painful walking and wearing his shoes.  He returns for preventive foot care services.  General Appearance  Alert, conversant and in no acute stress.  Vascular  Dorsalis pedis and posterior tibial  pulses are palpable  bilaterally.  Capillary return is within normal limits  bilaterally. Temperature is within normal limits  bilaterally.  Neurologic  Senn-Weinstein monofilament wire test within normal limits  bilaterally. Muscle power within normal limits bilaterally.  Nails Thick disfigured discolored nails with subungual debris  from hallux to fifth toes bilaterally. No evidence of bacterial infection or drainage bilaterally.  Orthopedic  No limitations of motion  feet .  No crepitus or effusions noted.  No bony pathology or digital deformities noted. 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  Skin  normotropic  bilaterally.  No signs of infections or ulcers noted.   Asymptomatic callus right hallux.  Clavi 3rd toe left foot.  Onychomycosis  Pain in toes right foot  Pain in toes left foot  Clavi left foot.  Debridement  of nails  1-5  B/L with a nail nipper.  Nails were then filed using a dremel tool with no incidents.  Debride clavi with # 15 blade.     RTC 3  months    Gardiner Barefoot DPM

## 2020-02-25 DIAGNOSIS — M4726 Other spondylosis with radiculopathy, lumbar region: Secondary | ICD-10-CM | POA: Diagnosis not present

## 2020-02-25 DIAGNOSIS — M9903 Segmental and somatic dysfunction of lumbar region: Secondary | ICD-10-CM | POA: Diagnosis not present

## 2020-02-25 DIAGNOSIS — M9901 Segmental and somatic dysfunction of cervical region: Secondary | ICD-10-CM | POA: Diagnosis not present

## 2020-02-25 DIAGNOSIS — M9902 Segmental and somatic dysfunction of thoracic region: Secondary | ICD-10-CM | POA: Diagnosis not present

## 2020-02-26 DIAGNOSIS — H6121 Impacted cerumen, right ear: Secondary | ICD-10-CM | POA: Diagnosis not present

## 2020-02-26 DIAGNOSIS — H60501 Unspecified acute noninfective otitis externa, right ear: Secondary | ICD-10-CM | POA: Diagnosis not present

## 2020-02-26 DIAGNOSIS — D649 Anemia, unspecified: Secondary | ICD-10-CM | POA: Diagnosis not present

## 2020-03-04 ENCOUNTER — Telehealth: Payer: Self-pay | Admitting: Hematology

## 2020-03-04 ENCOUNTER — Other Ambulatory Visit: Payer: Self-pay

## 2020-03-04 ENCOUNTER — Ambulatory Visit (INDEPENDENT_AMBULATORY_CARE_PROVIDER_SITE_OTHER): Payer: Medicare Other | Admitting: Otolaryngology

## 2020-03-04 VITALS — Temp 97.2°F

## 2020-03-04 DIAGNOSIS — H6121 Impacted cerumen, right ear: Secondary | ICD-10-CM

## 2020-03-04 NOTE — Progress Notes (Signed)
HPI: [name] is a 86 y.o. female who presents for evaluation of right ear problems. This began with blockage of her right ear. She was subsequently seen by her PCP who tried to clean the ear with a curette. But it caused more blockage and slight discomfort. She was seen again and treated with antibiotic eardrops 3 times daily.  Past Medical History:  Diagnosis Date  . Anemia    CHRONIC  . Aortic insufficiency    a. 2D echo 08/29/15: EF 55-60%, mild LVH, diastolic dysfunction, elevated LV filling pressure, mild AI, severe LAE, mild RAE, mild TR, dilated descending thoracic aorta just distal to the takeoff of the left subclavian, measuring 4.1 cm, no pericardial effusion.  . Arthritis    OA-SOME BACK AND NECK PAIN,  GOES TO CHIROPRACTOR TWICE A MONTH;  HX OF JOINT REPLACEMENTS   . Cancer ([place])    SKIN CANCERS REMOVED FROM LEGS  . CKD (chronic kidney disease), stage II   . Essential hypertension   . Gait abnormality 09/08/2017  . GERD (gastroesophageal reflux disease)   . PSVT (paroxysmal supraventricular tachycardia) ([place])   . Recurrent Pericarditis    RECURRENT   . Thoracic aneurysm    a. Followed by [facility] -last seen in 12/2013 w/ plan for f/u MRA and thoracic surgery f/u in 2 yrs, but patient does not wish to follow any longer.   Past Surgical History:  Procedure Laterality Date  . APPENDECTOMY    . BREAST EXCISIONAL BIOPSY Left 1992   benign  . EXCISION/RELEASE BURSA HIP Left 11/24/2012   Procedure: LEFT HIP BURSECTOMY AND TENDON REPAIR ;  Surgeon: Gearlean Alf, MD;  Location: [facility];  Service: Orthopedics;  Laterality: Left;  . HIP CLOSED REDUCTION Left 12/19/2012   Procedure: CLOSED MANIPULATION HIP;  Surgeon: Mauri Pole, MD;  Location: [facility];  Service: Orthopedics;  Laterality: Left;  . HIP CLOSED REDUCTION Left 12/05/2013   Procedure: CLOSED MANIPULATION HIP;  Surgeon: Augustin Schooling, MD;  Location: [facility];  Service: Orthopedics;  Laterality: Left;  . HIP CLOSED  REDUCTION Left 09/22/2015   Procedure: CLOSED REDUCTION HIP;  Surgeon: Melina Schools, MD;  Location: [facility];  Service: Orthopedics;  Laterality: Left;  . JOINT REPLACEMENT     LEFT TOTAL HIP REPLACEMENT AND REVISIONS X 2  . OOPHORECTOMY     has a partial of one ovary remaingin  . PELVIC LAPAROSCOPY     ovarian cyst removal,   . REVISION TOTAL HIP ARTHROPLASTY     left  . right Achilles Tendon repair  5/13  . TOTAL KNEE ARTHROPLASTY     right  . TOTAL KNEE ARTHROPLASTY     left  . VAGINAL HYSTERECTOMY     with ovarian cyst removal   Social History   Socioeconomic History  . Marital status: Widowed    Spouse name: Not on file  . Number of children: Not on file  . Years of education: Not on file  . Highest education level: Not on file  Occupational History  . Not on file  Tobacco Use  . Smoking status: Former Smoker    Types: Cigarettes  . Smokeless tobacco: Never Used  . Tobacco comment: quit age 24  Vaping Use  . Vaping Use: Never used  Substance and Sexual Activity  . Alcohol use: No    Alcohol/week: 0.0 standard drinks  . Drug use: No  . Sexual activity: Not Currently    Birth control/protection: Surgical  Comment: HYST-1st intercourse 86 yo-Fewer than 5 partners  Other Topics Concern  . Not on file  Social History Narrative  . Not on file   Social Determinants of Health   Financial Resource Strain: Not on file  Food Insecurity: Not on file  Transportation Needs: Not on file  Physical Activity: Not on file  Stress: Not on file  Social Connections: Not on file   Family History  Problem Relation Age of Onset  . Hypertension Mother   . Stroke Mother   . Heart attack Father 26  . ALS Other   . Breast cancer Neg Hx    Allergies  Allergen Reactions  . Keflex [Cephalexin] Other (See Comments)    Pt states this causes eye problems and STROKE-LIKE SYMPTOMS!!  . Shellfish Allergy Hives and Rash  . Shellfish-Derived Products Hives and Rash  .  Clindamycin/Lincomycin Rash  . Lincomycin Rash  . Penicillamine   . Sugar-Protein-Starch Other (See Comments)    Sugary foods cause legs to cramp  . Tape Other (See Comments)    Please use paper tape or Coban Wrap; patient's skin tears VERY easily!!  . Ciprofloxacin Rash  . Sulfa Antibiotics Hives and Rash  . Sulfites Hives and Rash   Prior to Admission medications   Medication Sig Start Date End Date Taking? Authorizing Provider  aspirin 81 MG tablet Take 81 mg by mouth daily.    [provider]  augmented betamethasone dipropionate (DIPROLENE-AF) 0.05 % cream Apply 1 application topically 2 (two) times daily. 07/31/19   [provider]  Calcium Carbonate-Vit D-Min (CALCIUM 1200 PO) Take 1 tablet by mouth daily.     [provider]  carboxymethylcellulose (REFRESH TEARS) 0.5 % SOLN Place 1 drop into both eyes daily.     [provider]  carvedilol (COREG) 12.5 MG tablet Take 12.5 mg by mouth. 1/2 in the Am; 1 in the PM    [provider]  cholecalciferol (VITAMIN D) 1000 UNITS tablet Take 1,000 Units by mouth daily.    [provider]  clobetasol cream (TEMOVATE) AB-123456789 % Apply 1 application topically 2 (two) times daily as needed (itching).  08/08/13   [provider]  colestipol (COLESTID) 1 g tablet Take 2 g by mouth 2 (two) times daily as needed. Patient not taking: Reported on 12/12/2019 07/17/19   [provider]  diltiazem (CARDIZEM CD) 240 MG 24 hr capsule Take 1 capsule (240 mg total) by mouth daily. 12/26/19   Belva Crome, MD  meloxicam (MOBIC) 7.5 MG tablet Take 7.5 mg by mouth daily as needed for pain.  03/21/15   [provider]  omega-3 acid ethyl esters (LOVAZA) 1 g capsule Take 1 g by mouth daily.     [provider]  PREMARIN 0.3 MG tablet Take 1 tablet (0.3 mg total) by mouth daily. 12/12/19   Joseph Pierini, MD  sodium chloride 1 g tablet Take 1 g by mouth daily. 08/21/19   [provider]     Positive ROS: Otherwise negative  All other systems have been reviewed and were otherwise negative with the exception of those mentioned in the HPI and as above.  Physical Exam: Constitutional: Alert, well-appearing, no acute distress Ears: External ears without lesions or tenderness. Left ear canal had minimal cerumen that was cleaned with curette. Right ear canal was completely occluded with cerumen and this was cleaned with hydroperoxide and suction. The TM was intact and clear. She had mild inflammation of  the ear canal and after cleaning the ear canal I applied CSF HC powder to the right ear canal. Nasal: External nose without lesions.. Clear nasal passages Oral: Lips and gums without lesions. Tongue and palate mucosa without lesions. Posterior oropharynx clear. Neck: No palpable adenopathy or masses Respiratory: Breathing comfortably  Skin: No facial/neck lesions or rash noted.  Cerumen impaction removal  Date/Time: 03/04/2020 1:15 PM Performed by: Rozetta Nunnery, MD Authorized by: Rozetta Nunnery, MD   Consent:    Consent obtained:  Verbal   Consent given by:  Patient   Risks discussed:  Pain and bleeding Procedure details:    Location:  R ear   Procedure type: irrigation and suction   Post-procedure details:    Inspection:  TM intact and canal normal   Hearing quality:  Improved   Patient tolerance of procedure:  Tolerated well, no immediate complications Comments:     Right ear canal was cleaned with hydroperoxide and suction. She had minimal inflammatory changes of the ear canal and applied CSF HC powder. TM was clear.    Assessment: Right ear cerumen impaction. Mild inflammatory changes.  Plan: She can stop the antibiotic eardrops however I discussed with her that if she has any pain or discharge from the ear would recommend using the eardrops 3 times daily for 3 to 4 days. She will follow-up as needed  Radene Journey, MD

## 2020-03-04 NOTE — Telephone Encounter (Signed)
Received a new hem referral from Dr. Delfina Redwood for anemia. [name] cld and scheduled to see Dr. Irene Limbo on 12/23 at 240pm. Pt aware to arrive by 2:15pm to be checked in on time.

## 2020-03-06 ENCOUNTER — Ambulatory Visit (INDEPENDENT_AMBULATORY_CARE_PROVIDER_SITE_OTHER): Payer: Medicare Other | Admitting: Otolaryngology

## 2020-03-06 ENCOUNTER — Other Ambulatory Visit: Payer: Self-pay

## 2020-03-06 ENCOUNTER — Inpatient Hospital Stay: Payer: Medicare Other | Attending: Hematology | Admitting: Hematology

## 2020-03-06 ENCOUNTER — Inpatient Hospital Stay: Payer: Medicare Other

## 2020-03-06 VITALS — BP 183/82 | HR 80 | Temp 96.9°F | Resp 18 | Ht 64.0 in | Wt 155.7 lb

## 2020-03-06 DIAGNOSIS — D649 Anemia, unspecified: Secondary | ICD-10-CM | POA: Diagnosis not present

## 2020-03-06 DIAGNOSIS — E538 Deficiency of other specified B group vitamins: Secondary | ICD-10-CM

## 2020-03-06 DIAGNOSIS — I129 Hypertensive chronic kidney disease with stage 1 through stage 4 chronic kidney disease, or unspecified chronic kidney disease: Secondary | ICD-10-CM | POA: Diagnosis not present

## 2020-03-06 DIAGNOSIS — Z85828 Personal history of other malignant neoplasm of skin: Secondary | ICD-10-CM | POA: Diagnosis not present

## 2020-03-06 DIAGNOSIS — Z87891 Personal history of nicotine dependence: Secondary | ICD-10-CM | POA: Diagnosis not present

## 2020-03-06 DIAGNOSIS — R197 Diarrhea, unspecified: Secondary | ICD-10-CM

## 2020-03-06 DIAGNOSIS — N182 Chronic kidney disease, stage 2 (mild): Secondary | ICD-10-CM | POA: Diagnosis not present

## 2020-03-06 DIAGNOSIS — Z79890 Hormone replacement therapy (postmenopausal): Secondary | ICD-10-CM

## 2020-03-06 DIAGNOSIS — Z8249 Family history of ischemic heart disease and other diseases of the circulatory system: Secondary | ICD-10-CM | POA: Insufficient documentation

## 2020-03-06 DIAGNOSIS — R5383 Other fatigue: Secondary | ICD-10-CM | POA: Insufficient documentation

## 2020-03-06 LAB — CBC WITH DIFFERENTIAL/PLATELET
Abs Immature Granulocytes: 0.04 10*3/uL (ref 0.00–0.07)
Basophils Absolute: 0.2 10*3/uL — ABNORMAL HIGH (ref 0.0–0.1)
Basophils Relative: 2 %
Eosinophils Absolute: 0.4 10*3/uL (ref 0.0–0.5)
Eosinophils Relative: 4 %
HCT: 31 % — ABNORMAL LOW (ref 36.0–46.0)
Hemoglobin: 9.5 g/dL — ABNORMAL LOW (ref 12.0–15.0)
Immature Granulocytes: 0 %
Lymphocytes Relative: 28 %
Lymphs Abs: 2.7 10*3/uL (ref 0.7–4.0)
MCH: 27.7 pg (ref 26.0–34.0)
MCHC: 30.6 g/dL (ref 30.0–36.0)
MCV: 90.4 fL (ref 80.0–100.0)
Monocytes Absolute: 0.9 10*3/uL (ref 0.1–1.0)
Monocytes Relative: 9 %
Neutro Abs: 5.3 10*3/uL (ref 1.7–7.7)
Neutrophils Relative %: 57 %
Platelets: 281 10*3/uL (ref 150–400)
RBC: 3.43 MIL/uL — ABNORMAL LOW (ref 3.87–5.11)
RDW: 14.3 % (ref 11.5–15.5)
WBC: 9.5 10*3/uL (ref 4.0–10.5)
nRBC: 0 % (ref 0.0–0.2)

## 2020-03-06 LAB — CMP (CANCER CENTER ONLY)
ALT: 22 U/L (ref 0–44)
AST: 31 U/L (ref 15–41)
Albumin: 4.8 g/dL (ref 3.5–5.0)
Alkaline Phosphatase: 61 U/L (ref 38–126)
Anion gap: 5 (ref 5–15)
BUN: 36 mg/dL — ABNORMAL HIGH (ref 8–23)
CO2: 28 mmol/L (ref 22–32)
Calcium: 9.9 mg/dL (ref 8.9–10.3)
Chloride: 106 mmol/L (ref 98–111)
Creatinine: 1.34 mg/dL — ABNORMAL HIGH (ref 0.44–1.00)
GFR, Estimated: 38 mL/min — ABNORMAL LOW (ref >60)
Glucose, Bld: 97 mg/dL (ref 70–99)
Potassium: 4.6 mmol/L (ref 3.5–5.1)
Sodium: 139 mmol/L (ref 135–145)
Total Bilirubin: 0.2 mg/dL — ABNORMAL LOW (ref 0.3–1.2)
Total Protein: 8.8 g/dL — ABNORMAL HIGH (ref 6.5–8.1)

## 2020-03-06 LAB — SAMPLE TO BLOOD BANK

## 2020-03-06 LAB — VITAMIN B12: Vitamin B-12: 841 pg/mL (ref 180–914)

## 2020-03-06 LAB — SEDIMENTATION RATE: Sed Rate: 22 mm/hr (ref 0–22)

## 2020-03-06 NOTE — Progress Notes (Signed)
HEMATOLOGY/ONCOLOGY CONSULTATION NOTE  Date of Service: 03/06/2020  Patient Care Team: Seward Carol, MD as PCP - General (Internal Medicine) Belva Crome, MD as PCP - Cardiology (Cardiology)  CHIEF COMPLAINTS/PURPOSE OF CONSULTATION:  Anemia  HISTORY OF PRESENTING ILLNESS:   [name] is a wonderful 86 y.o. female who has been referred to [place] by Dr. Delfina Redwood for evaluation and management of anemia. The pt reports that she is doing well overall.   The pt reports noticable fatigue. Pt has known of her anemia and iron deficiency for years and was previously placed on PO iron but could not tolerate it. She has previously tried Ferrous Sulfate which caused diarrhea.  A month ago the pt suddenly became unable to move her fingers, the next day her fingers were swollen. The day following the swelling her fingers returned to normal and regained their regular function. She has not been diagnosed with Rheumatoid Arthritis to her knowledge.  Three months ago the pt was having diarrhea for which she was given Betamethasone by GI. Pt is not sure what the diagnosis was. She was seen by Dr. Ronnette Juniper and is scheduled to follow up at the beginning of January. Her diarrhea is currently well-controlled with the steroids.   Pt is using Meloxicam as needed. She is on HRT, managed by Dr. Delilah Shan. Pt had a total hysterectomy at 30 due to significant pelvic pain.   She has previously had hyponatremia. She received a head CT in 2019 due to concussion and disturbed gait. Dr. Jannifer Franklin, Neurology, did not have concerns for low pressure hydrocephalus.   Pt has had both knees and her left hip replaced. She previously had regular Colonoscopies but discontinued due to numerous bowel adhesions.  Most recent lab results (02/26/2020) of CBC is as follows: all values are WNL except for RBC at 3.45, Hgb at 9.7, HCT at 30.0, Baso Rel at 1.1, Mono Abs at 0.9K. 01/16/2020 RF at 57.5 11/23/2019 Ferritin at  25.3  On review of systems, pt reports fatigue and denies nose bleeds, gum bleeds, hematuria, bloody/black stools, new bone pain, new joint pain, unexpected weight loss, low appetite, diarrhea and any other symptoms.   On PMHx the pt reports Anemia, Arthritis, CKD stage II, Left Total Hip Replacement, Oophorectomy, Hysterectomy, B/L Total Knee Arthroplasty.   MEDICAL HISTORY:  Past Medical History:  Diagnosis Date  . Anemia    CHRONIC  . Aortic insufficiency    a. 2D echo 08/29/15: EF 55-60%, mild LVH, diastolic dysfunction, elevated LV filling pressure, mild AI, severe LAE, mild RAE, mild TR, dilated descending thoracic aorta just distal to the takeoff of the left subclavian, measuring 4.1 cm, no pericardial effusion.  . Arthritis    OA-SOME BACK AND NECK PAIN,  GOES TO CHIROPRACTOR TWICE A MONTH;  HX OF JOINT REPLACEMENTS   . Cancer ([place])    SKIN CANCERS REMOVED FROM LEGS  . CKD (chronic kidney disease), stage II   . Essential hypertension   . Gait abnormality 09/08/2017  . GERD (gastroesophageal reflux disease)   . PSVT (paroxysmal supraventricular tachycardia) ([place])   . Recurrent Pericarditis    RECURRENT   . Thoracic aneurysm    a. Followed by [facility] -last seen in 12/2013 w/ plan for f/u MRA and thoracic surgery f/u in 2 yrs, but patient does not wish to follow any longer.    SURGICAL HISTORY: Past Surgical History:  Procedure Laterality Date  . APPENDECTOMY    . BREAST EXCISIONAL BIOPSY Left  1992   benign  . EXCISION/RELEASE BURSA HIP Left 11/24/2012   Procedure: LEFT HIP BURSECTOMY AND TENDON REPAIR ;  Surgeon: Gearlean Alf, MD;  [facility];  Service: Orthopedics;  Laterality: Left;  . HIP CLOSED REDUCTION Left 12/19/2012   Procedure: CLOSED MANIPULATION HIP;  Surgeon: Mauri Pole, MD;  Location[facility];  Service: Orthopedics;  Laterality: Left;  . HIP CLOSED REDUCTION Left 12/05/2013   Procedure: CLOSED MANIPULATION HIP;  Surgeon: Augustin Schooling, MD;  Location:  [facility];  Service: Orthopedics;  Laterality: Left;  . HIP CLOSED REDUCTION Left 09/22/2015   Procedure: CLOSED REDUCTION HIP;  Surgeon: Melina Schools, MD;  Location: [facility];  Service: Orthopedics;  Laterality: Left;  . JOINT REPLACEMENT     LEFT TOTAL HIP REPLACEMENT AND REVISIONS X 2  . OOPHORECTOMY     has a partial of one ovary remaingin  . PELVIC LAPAROSCOPY     ovarian cyst removal,   . REVISION TOTAL HIP ARTHROPLASTY     left  . right Achilles Tendon repair  5/13  . TOTAL KNEE ARTHROPLASTY     right  . TOTAL KNEE ARTHROPLASTY     left  . VAGINAL HYSTERECTOMY     with ovarian cyst removal    SOCIAL HISTORY: Social History   Socioeconomic History  . Marital status: Widowed    Spouse name: Not on file  . Number of children: Not on file  . Years of education: Not on file  . Highest education level: Not on file  Occupational History  . Not on file  Tobacco Use  . Smoking status: Former Smoker    Types: Cigarettes  . Smokeless tobacco: Never Used  . Tobacco comment: quit age 35  Vaping Use  . Vaping Use: Never used  Substance and Sexual Activity  . Alcohol use: No    Alcohol/week: 0.0 standard drinks  . Drug use: No  . Sexual activity: Not Currently    Birth control/protection: Surgical    Comment: HYST-1st intercourse 86 yo-Fewer than 5 partners  Other Topics Concern  . Not on file  Social History Narrative  . Not on file   Social Determinants of Health   Financial Resource Strain: Not on file  Food Insecurity: Not on file  Transportation Needs: Not on file  Physical Activity: Not on file  Stress: Not on file  Social Connections: Not on file  Intimate Partner Violence: Not on file    FAMILY HISTORY: Family History  Problem Relation Age of Onset  . Hypertension Mother   . Stroke Mother   . Heart attack Father 10  . ALS Other   . Breast cancer Neg Hx     ALLERGIES:  is allergic to keflex [cephalexin], shellfish allergy, shellfish-derived products,  clindamycin/lincomycin, lincomycin, penicillamine, sugar-protein-starch, tape, ciprofloxacin, sulfa antibiotics, and sulfites.  MEDICATIONS:  Current Outpatient Medications  Medication Sig Dispense Refill  . aspirin 81 MG tablet Take 81 mg by mouth daily.    Marland Kitchen augmented betamethasone dipropionate (DIPROLENE-AF) 0.05 % cream Apply 1 application topically 2 (two) times daily.    . Calcium Carbonate-Vit D-Min (CALCIUM 1200 PO) Take 1 tablet by mouth daily.     . carboxymethylcellulose (REFRESH TEARS) 0.5 % SOLN Place 1 drop into both eyes daily.     . carvedilol (COREG) 12.5 MG tablet Take 12.5 mg by mouth. 1/2 in the Am; 1 in the PM    . cholecalciferol (VITAMIN D) 1000 UNITS tablet Take 1,000 Units  by mouth daily.    . clobetasol cream (TEMOVATE) AB-123456789 % Apply 1 application topically 2 (two) times daily as needed (itching).     . colestipol (COLESTID) 1 g tablet Take 2 g by mouth 2 (two) times daily as needed. (Patient not taking: Reported on 12/12/2019)    . diltiazem (CARDIZEM CD) 240 MG 24 hr capsule Take 1 capsule (240 mg total) by mouth daily. 90 capsule 3  . meloxicam (MOBIC) 7.5 MG tablet Take 7.5 mg by mouth daily as needed for pain.     Marland Kitchen omega-3 acid ethyl esters (LOVAZA) 1 g capsule Take 1 g by mouth daily.     Marland Kitchen PREMARIN 0.3 MG tablet Take 1 tablet (0.3 mg total) by mouth daily. 90 tablet 3  . sodium chloride 1 g tablet Take 1 g by mouth daily.     No current facility-administered medications for this visit.    REVIEW OF SYSTEMS:    10 Point review of Systems was done is negative except as noted above.  PHYSICAL EXAMINATION: ECOG PERFORMANCE STATUS: 0 - Asymptomatic  . Vitals:   03/06/20 1453  BP: (!) 183/82  Pulse: 80  Resp: 18  Temp: (!) 96.9 F (36.1 C)  SpO2: 95%   Filed Weights   03/06/20 1453  Weight: 155 lb 11.2 oz (70.6 kg)   .Body mass index is 26.73 kg/m.  Exam was given in a chair   GENERAL:alert, in no acute distress and comfortable SKIN: no acute  rashes, no significant lesions EYES: conjunctiva are pink and non-injected, sclera anicteric OROPHARYNX: MMM, no exudates, no oropharyngeal erythema or ulceration NECK: supple, no JVD LYMPH:  no palpable lymphadenopathy in the cervical, axillary or inguinal regions LUNGS: clear to auscultation b/l with normal respiratory effort HEART: regular rate & rhythm ABDOMEN:  normoactive bowel sounds , non tender, not distended. Extremity: no pedal edema PSYCH: alert & oriented x 3 with fluent speech NEURO: no focal motor/sensory deficits  LABORATORY DATA:  I have reviewed the data as listed  . CBC Latest Ref Rng & Units 03/06/2020 03/06/2020 11/12/2016  WBC 4.0 - 10.5 K/uL 9.5 - 9.0  Hemoglobin 12.0 - 15.0 g/dL 9.5(L) - 9.7(L)  Hematocrit 34.0 - 46.6 % 31.0(L) 29.6(L) 29.5(L)  Platelets 150 - 400 K/uL 281 - 255   . CBC    Component Value Date/Time   WBC 9.5 03/06/2020 1535   RBC 3.43 (L) 03/06/2020 1535   HGB 9.5 (L) 03/06/2020 1535   HGB 10.8 (L) 12/26/2007 1539   HCT 29.6 (L) 03/06/2020 1535   HCT 31.0 (L) 03/06/2020 1535   HCT 33.4 (L) 12/26/2007 1539   PLT 281 03/06/2020 1535   PLT 314 12/26/2007 1539   MCV 90.4 03/06/2020 1535   MCV 88.5 12/26/2007 1539   MCH 27.7 03/06/2020 1535   MCHC 30.6 03/06/2020 1535   RDW 14.3 03/06/2020 1535   RDW 15.3 (H) 12/26/2007 1539   LYMPHSABS 2.7 03/06/2020 1535   LYMPHSABS 2.2 12/26/2007 1539   MONOABS 0.9 03/06/2020 1535   MONOABS 0.8 12/26/2007 1539   EOSABS 0.4 03/06/2020 1535   EOSABS 0.6 (H) 12/26/2007 1539   BASOSABS 0.2 (H) 03/06/2020 1535   BASOSABS 0.1 12/26/2007 1539     . CMP Latest Ref Rng & Units 11/12/2016 08/28/2015 12/05/2013  Glucose 65 - 99 mg/dL 95 87 89  BUN 6 - 20 mg/dL 24(H) 30(H) 20  Creatinine 0.44 - 1.00 mg/dL 0.89 1.12(H) 0.98  Sodium 135 - 145 mmol/L 128(L) 134(L)  136(L)  Potassium 3.5 - 5.1 mmol/L 4.2 4.8 4.3  Chloride 101 - 111 mmol/L 96(L) 101 102  CO2 22 - 32 mmol/L 24 22 22   Calcium 8.9 - 10.3  mg/dL 8.6(L) 9.0 8.3(L)  Total Protein 6.0 - 8.3 g/dL - - -  Total Bilirubin 0.3 - 1.2 mg/dL - - -  Alkaline Phos 39 - 117 U/L - - -  AST 0 - 37 U/L - - -  ALT 0 - 35 U/L - - -     RADIOGRAPHIC STUDIES: I have personally reviewed the radiological images as listed and agreed with the findings in the report. No results found.  ASSESSMENT & PLAN:   86 yo with   1) Chronic normocytic anemia  PLAN: -Discussed patient's most recent labs from 02/26/2020, all values are WNL except for RBC at 3.45, Hgb at 9.7, HCT at 30.0, Baso Rel at 1.1, Mono Abs at 0.9K. -Discussed 01/16/2020 RF at 57.5 -Discussed 11/23/2019 Ferritin at 25.3 -Advised pt that there will be some element of anemia due to advanced age. -Advised pt that her Ferritin levels were low, which is contributing to her anemia.  -Advised pt that CKD can cause some anemia via inflammation and lowered production of Erythropoietin.  -Advised pt that the GI issues previously causing her diarrhea could be contributing to her anemia, iron deficiency, and other nutritional deficiencies.  -Advised pt that if Ferritin is low would recommend replacement IV - pt agrees.  -Advised pt that there is a low-risk of allergic reactions with IV Iron infusions.  -Will get labs today  -Will see back in 2 weeks via phone   FOLLOW UP: Labs today Phone visit with Dr Irene Limbo in 2 weeks  . Orders Placed This Encounter  Procedures  . CBC with Differential/Platelet    Standing Status:   Future    Number of Occurrences:   1    Standing Expiration Date:   03/06/2021  . CMP ([place] only)    Standing Status:   Future    Number of Occurrences:   1    Standing Expiration Date:   03/06/2021  . Sedimentation rate    Standing Status:   Future    Number of Occurrences:   1    Standing Expiration Date:   03/06/2021  . Ferritin    Standing Status:   Future    Number of Occurrences:   1    Standing Expiration Date:   03/06/2021  . Iron and TIBC     Standing Status:   Future    Number of Occurrences:   1    Standing Expiration Date:   03/06/2021  . Vitamin B12    Standing Status:   Future    Number of Occurrences:   1    Standing Expiration Date:   03/06/2021  . Rheumatoid factor    Standing Status:   Future    Number of Occurrences:   1    Standing Expiration Date:   03/06/2021  . Folate RBC    Standing Status:   Future    Number of Occurrences:   1    Standing Expiration Date:   03/06/2021  . Erythropoietin    Standing Status:   Future    Number of Occurrences:   1    Standing Expiration Date:   03/06/2021  . Sample to Blood Bank    Standing Status:   Future    Number of Occurrences:   1  Standing Expiration Date:   03/06/2021    All of the patients questions were answered with apparent satisfaction. The patient knows to call the clinic with any problems, questions or concerns.  I spent 30 mins counseling the patient face to face. The total time spent in the appointment was 45 minutes and more than 50% was on counseling and direct patient cares.    Sullivan Lone MD [place] AAHIVMS [facility] [place] Hematology/Oncology Physician [facility]  (Office):       [phone] (Work cell):  [phone] (Fax):           [phone]  03/06/2020 3:50 PM  I, Yevette Edwards, am acting as a scribe for Dr. Sullivan Lone.   .I have reviewed the above documentation for accuracy and completeness, and I agree with the above. Brunetta Genera MD

## 2020-03-07 LAB — RHEUMATOID FACTOR: Rheumatoid fact SerPl-aCnc: 99.5 IU/mL — ABNORMAL HIGH (ref <14.0)

## 2020-03-09 LAB — FOLATE RBC
Folate, Hemolysate: 605 ng/mL
Folate, RBC: 2044 ng/mL (ref >498)
Hematocrit: 29.6 % — ABNORMAL LOW (ref 34.0–46.6)

## 2020-03-10 LAB — IRON AND TIBC
Iron: 35 ug/dL — ABNORMAL LOW (ref 41–142)
Saturation Ratios: 8 % — ABNORMAL LOW (ref 21–57)
TIBC: 448 ug/dL — ABNORMAL HIGH (ref 236–444)
UIBC: 413 ug/dL — ABNORMAL HIGH (ref 120–384)

## 2020-03-10 LAB — ERYTHROPOIETIN: Erythropoietin: 14.9 m[IU]/mL (ref 2.6–18.5)

## 2020-03-10 LAB — FERRITIN: Ferritin: 35 ng/mL (ref 11–307)

## 2020-03-12 ENCOUNTER — Telehealth: Payer: Self-pay | Admitting: Hematology

## 2020-03-12 NOTE — Telephone Encounter (Signed)
Scheduled per 12/23 los, patient has been called and notified of upcoming appointments. 

## 2020-03-13 DIAGNOSIS — Z96652 Presence of left artificial knee joint: Secondary | ICD-10-CM | POA: Diagnosis not present

## 2020-03-13 DIAGNOSIS — Z96642 Presence of left artificial hip joint: Secondary | ICD-10-CM | POA: Diagnosis not present

## 2020-03-19 DIAGNOSIS — M9902 Segmental and somatic dysfunction of thoracic region: Secondary | ICD-10-CM | POA: Diagnosis not present

## 2020-03-19 DIAGNOSIS — M9901 Segmental and somatic dysfunction of cervical region: Secondary | ICD-10-CM | POA: Diagnosis not present

## 2020-03-19 DIAGNOSIS — M9903 Segmental and somatic dysfunction of lumbar region: Secondary | ICD-10-CM | POA: Diagnosis not present

## 2020-03-19 DIAGNOSIS — M4726 Other spondylosis with radiculopathy, lumbar region: Secondary | ICD-10-CM | POA: Diagnosis not present

## 2020-03-20 ENCOUNTER — Other Ambulatory Visit: Payer: Self-pay

## 2020-03-20 ENCOUNTER — Inpatient Hospital Stay: Payer: Medicare Other | Attending: Hematology | Admitting: Hematology

## 2020-03-20 DIAGNOSIS — D649 Anemia, unspecified: Secondary | ICD-10-CM | POA: Diagnosis not present

## 2020-03-20 DIAGNOSIS — I129 Hypertensive chronic kidney disease with stage 1 through stage 4 chronic kidney disease, or unspecified chronic kidney disease: Secondary | ICD-10-CM | POA: Diagnosis not present

## 2020-03-20 DIAGNOSIS — Z8249 Family history of ischemic heart disease and other diseases of the circulatory system: Secondary | ICD-10-CM | POA: Diagnosis not present

## 2020-03-20 DIAGNOSIS — R5383 Other fatigue: Secondary | ICD-10-CM | POA: Insufficient documentation

## 2020-03-20 DIAGNOSIS — Z87891 Personal history of nicotine dependence: Secondary | ICD-10-CM | POA: Insufficient documentation

## 2020-03-20 DIAGNOSIS — N182 Chronic kidney disease, stage 2 (mild): Secondary | ICD-10-CM | POA: Diagnosis not present

## 2020-03-20 DIAGNOSIS — Z85828 Personal history of other malignant neoplasm of skin: Secondary | ICD-10-CM | POA: Diagnosis not present

## 2020-03-20 DIAGNOSIS — D509 Iron deficiency anemia, unspecified: Secondary | ICD-10-CM | POA: Diagnosis not present

## 2020-03-20 DIAGNOSIS — Z79890 Hormone replacement therapy (postmenopausal): Secondary | ICD-10-CM | POA: Insufficient documentation

## 2020-03-20 DIAGNOSIS — R197 Diarrhea, unspecified: Secondary | ICD-10-CM | POA: Insufficient documentation

## 2020-03-20 NOTE — Progress Notes (Signed)
HEMATOLOGY/ONCOLOGY CONSULTATION NOTE  Date of Service: 03/20/2020  Patient Care Team: Renford Dills, MD as PCP - General (Internal Medicine) Lyn Records, MD as PCP - Cardiology (Cardiology)  CHIEF COMPLAINTS/PURPOSE OF CONSULTATION:  Anemia  HISTORY OF PRESENTING ILLNESS:   [name] is a wonderful 87 y.o. female who has been referred to [place] by Dr. Nehemiah Wall for evaluation and management of anemia. The pt reports that she is doing well overall.   The pt reports noticable fatigue. Pt has known of her anemia and iron deficiency for years and was previously placed on PO iron but could not tolerate it. She has previously tried Ferrous Sulfate which caused diarrhea.  A month ago the pt suddenly became unable to move her fingers, the next day her fingers were swollen. The day following the swelling her fingers returned to normal and regained their regular function. She has not been diagnosed with Rheumatoid Arthritis to her knowledge.  Three months ago the pt was having diarrhea for which she was given Betamethasone by GI. Pt is not sure what the diagnosis was. She was seen by Dr. Kerin Wall and is scheduled to follow up at the beginning of January. Her diarrhea is currently well-controlled with the steroids.   Pt is using Meloxicam as needed. She is on HRT, managed by Dr. Penni Wall. Pt had a total hysterectomy at 30 due to significant pelvic pain.   She has previously had hyponatremia. She received a head CT in 2019 due to concussion and disturbed gait. [name], Neurology, did not have concerns for low pressure hydrocephalus.   Pt has had both knees and her left hip replaced. She previously had regular Colonoscopies but discontinued due to numerous bowel adhesions.  Most recent lab results (02/26/2020) of CBC is as follows: all values are WNL except for RBC at 3.45, Hgb at 9.7, HCT at 30.0, Baso Rel at 1.1, Mono Abs at 0.9K. 01/16/2020 RF at 57.5 11/23/2019 Ferritin at  25.3  On review of systems, pt reports fatigue and denies nose bleeds, gum bleeds, hematuria, bloody/black stools, new bone pain, new joint pain, unexpected weight loss, low appetite, diarrhea and any other symptoms.   On PMHx the pt reports Anemia, Arthritis, CKD stage II, Left Total Hip Replacement, Oophorectomy, Hysterectomy, B/L Total Knee Arthroplasty.  INTERVAL HISTORY:  I connected with  [name] on 03/20/20 by telephone and verified that I am speaking with the correct person using two identifiers.   I discussed the limitations of evaluation and management by telemedicine. The patient expressed understanding and agreed to proceed.  Other persons participating in the visit and their role in the encounter:          -[name], Medical Scribe        -[name], Medical Scribe  Patient's location: Home Provider's location: [facility] [employer] [name] is a wonderful 87 y.o. female who is here for evaluation and management of anemia. The patient's last visit with [place] was on 03/06/2020. The pt reports that she is doing well overall.  The pt reports that her now-resolved diarrhea was diagnosed as Microscopic Colitis. She is having no joint pain at this time.  Lab results (03/06/2020) of CBC w/diff and CMP is as follows: all values are WNL except for RBC at 3.43, Hgb at 9.5, HCT at 31.0, Baso Abs at 0.2K, BUN at 36, Creatinine at 1.34, Total Protein at 8.8, Total Bilirubin at 0.2, GFR Est at 38. 03/06/2020  Ferritin at 35 03/06/2020 Sed Rate at 22 03/06/2020 Vitamin B12 at 841 03/06/2020 Erythropoietin at 14.9 03/06/2020 Rheumatoid factor at 99.5 03/06/2020 Folate Hemolysate at 605.0, HCT at 29.6, Folate RBC at 2044 03/06/2020 Iron Panel is as follows: Iron at 35, TIBC at 448, Sat Ratios at 8, UIBC at 413  On review of systems, pt reports diarrhea and denies specific joint pain and any other symptoms.   MEDICAL HISTORY:  Past Medical History:  Diagnosis Date   . Anemia    CHRONIC  . Aortic insufficiency    a. 2D echo 08/29/15: EF 55-60%, mild LVH, diastolic dysfunction, elevated LV filling pressure, mild AI, severe LAE, mild RAE, mild TR, dilated descending thoracic aorta just distal to the takeoff of the left subclavian, measuring 4.1 cm, no pericardial effusion.  . Arthritis    OA-SOME BACK AND NECK PAIN,  GOES TO CHIROPRACTOR TWICE A MONTH;  HX OF JOINT REPLACEMENTS   . Cancer ([place])    SKIN CANCERS REMOVED FROM LEGS  . CKD (chronic kidney disease), stage II   . Essential hypertension   . Gait abnormality 09/08/2017  . GERD (gastroesophageal reflux disease)   . PSVT (paroxysmal supraventricular tachycardia) ([place])   . Recurrent Pericarditis    RECURRENT   . Thoracic aneurysm    a. Followed by [facility] -last seen in 12/2013 w/ plan for f/u MRA and thoracic surgery f/u in 2 yrs, but patient does not wish to follow any longer.    SURGICAL HISTORY: Past Surgical History:  Procedure Laterality Date  . APPENDECTOMY    . BREAST EXCISIONAL BIOPSY Left 1992   benign  . EXCISION/RELEASE BURSA HIP Left 11/24/2012   Procedure: LEFT HIP BURSECTOMY AND TENDON REPAIR ;  Surgeon: Gearlean Alf, MD;  Location: [facility];  Service: Orthopedics;  Laterality: Left;  . HIP CLOSED REDUCTION Left 12/19/2012   Procedure: CLOSED MANIPULATION HIP;  Surgeon: Mauri Pole, MD;  Location: [facility];  Service: Orthopedics;  Laterality: Left;  . HIP CLOSED REDUCTION Left 12/05/2013   Procedure: CLOSED MANIPULATION HIP;  Surgeon: Augustin Schooling, MD;  Location: [facility];  Service: Orthopedics;  Laterality: Left;  . HIP CLOSED REDUCTION Left 09/22/2015   Procedure: CLOSED REDUCTION HIP;  Surgeon: Melina Schools, MD;  Location: [facility];  Service: Orthopedics;  Laterality: Left;  . JOINT REPLACEMENT     LEFT TOTAL HIP REPLACEMENT AND REVISIONS X 2  . OOPHORECTOMY     has a partial of one ovary remaingin  . PELVIC LAPAROSCOPY     ovarian cyst removal,   . REVISION TOTAL HIP  ARTHROPLASTY     left  . right Achilles Tendon repair  5/13  . TOTAL KNEE ARTHROPLASTY     right  . TOTAL KNEE ARTHROPLASTY     left  . VAGINAL HYSTERECTOMY     with ovarian cyst removal    SOCIAL HISTORY: Social History   Socioeconomic History  . Marital status: Widowed    Spouse name: Not on file  . Number of children: Not on file  . Years of education: Not on file  . Highest education level: Not on file  Occupational History  . Not on file  Tobacco Use  . Smoking status: Former Smoker    Types: Cigarettes  . Smokeless tobacco: Never Used  . Tobacco comment: quit age 57  Vaping Use  . Vaping Use: Never used  Substance and Sexual Activity  . Alcohol use: No    Alcohol/week: 0.0 standard  drinks  . Drug use: No  . Sexual activity: Not Currently    Birth control/protection: Surgical    Comment: HYST-1st intercourse 87 yo-Fewer than 5 partners  Other Topics Concern  . Not on file  Social History Narrative  . Not on file   Social Determinants of Health   Financial Resource Strain: Not on file  Food Insecurity: Not on file  Transportation Needs: Not on file  Physical Activity: Not on file  Stress: Not on file  Social Connections: Not on file  Intimate Partner Violence: Not on file    FAMILY HISTORY: Family History  Problem Relation Age of Onset  . Hypertension Mother   . Stroke Mother   . Heart attack Father 52  . ALS Other   . Breast cancer Neg Hx     ALLERGIES:  is allergic to keflex [cephalexin], shellfish allergy, shellfish-derived products, clindamycin/lincomycin, lincomycin, penicillamine, sugar-protein-starch, tape, ciprofloxacin, sulfa antibiotics, and sulfites.  MEDICATIONS:  Current Outpatient Medications  Medication Sig Dispense Refill  . aspirin 81 MG tablet Take 81 mg by mouth daily.    Marland Kitchen augmented betamethasone dipropionate (DIPROLENE-AF) 0.05 % cream Apply 1 application topically 2 (two) times daily.    . Calcium Carbonate-Vit D-Min  (CALCIUM 1200 PO) Take 1 tablet by mouth daily.     . carboxymethylcellulose (REFRESH TEARS) 0.5 % SOLN Place 1 drop into both eyes daily.     . carvedilol (COREG) 12.5 MG tablet Take 12.5 mg by mouth. 1/2 in the Am; 1 in the PM    . cholecalciferol (VITAMIN D) 1000 UNITS tablet Take 1,000 Units by mouth daily.    . clobetasol cream (TEMOVATE) 0.05 % Apply 1 application topically 2 (two) times daily as needed (itching).     . colestipol (COLESTID) 1 g tablet Take 2 g by mouth 2 (two) times daily as needed. (Patient not taking: Reported on 12/12/2019)    . diltiazem (CARDIZEM CD) 240 MG 24 hr capsule Take 1 capsule (240 mg total) by mouth daily. 90 capsule 3  . meloxicam (MOBIC) 7.5 MG tablet Take 7.5 mg by mouth daily as needed for pain.     Marland Kitchen omega-3 acid ethyl esters (LOVAZA) 1 g capsule Take 1 g by mouth daily.     Marland Kitchen PREMARIN 0.3 MG tablet Take 1 tablet (0.3 mg total) by mouth daily. 90 tablet 3  . sodium chloride 1 g tablet Take 1 g by mouth daily.     No current facility-administered medications for this visit.    REVIEW OF SYSTEMS:   A 10+ POINT REVIEW OF SYSTEMS WAS OBTAINED including neurology, dermatology, psychiatry, cardiac, respiratory, lymph, extremities, GI, GU, Musculoskeletal, constitutional, breasts, reproductive, HEENT.  All pertinent positives are noted in the HPI.  All others are negative.   PHYSICAL EXAMINATION: ECOG PERFORMANCE STATUS: 0 - Asymptomatic  Telehealth visit 03/20/2020  LABORATORY DATA:   I have reviewed the data as listed  . CBC Latest Ref Rng & Units 03/06/2020 03/06/2020 11/12/2016  WBC 4.0 - 10.5 K/uL 9.5 - 9.0  Hemoglobin 12.0 - 15.0 g/dL 1.6(X) - 9.7(L)  Hematocrit 34.0 - 46.6 % 31.0(L) 29.6(L) 29.5(L)  Platelets 150 - 400 K/uL 281 - 255   . CBC    Component Value Date/Time   WBC 9.5 03/06/2020 1535   RBC 3.43 (L) 03/06/2020 1535   HGB 9.5 (L) 03/06/2020 1535   HGB 10.8 (L) 12/26/2007 1539   HCT 29.6 (L) 03/06/2020 1535   HCT 31.0 (L)  03/06/2020 1535  HCT 33.4 (L) 12/26/2007 1539   PLT 281 03/06/2020 1535   PLT 314 12/26/2007 1539   MCV 90.4 03/06/2020 1535   MCV 88.5 12/26/2007 1539   MCH 27.7 03/06/2020 1535   MCHC 30.6 03/06/2020 1535   RDW 14.3 03/06/2020 1535   RDW 15.3 (H) 12/26/2007 1539   LYMPHSABS 2.7 03/06/2020 1535   LYMPHSABS 2.2 12/26/2007 1539   MONOABS 0.9 03/06/2020 1535   MONOABS 0.8 12/26/2007 1539   EOSABS 0.4 03/06/2020 1535   EOSABS 0.6 (H) 12/26/2007 1539   BASOSABS 0.2 (H) 03/06/2020 1535   BASOSABS 0.1 12/26/2007 1539     . CMP Latest Ref Rng & Units 03/06/2020 11/12/2016 08/28/2015  Glucose 70 - 99 mg/dL 97 95 87  BUN 8 - 23 mg/dL 36(H) 24(H) 30(H)  Creatinine 0.44 - 1.00 mg/dL 1.34(H) 0.89 1.12(H)  Sodium 135 - 145 mmol/L 139 128(L) 134(L)  Potassium 3.5 - 5.1 mmol/L 4.6 4.2 4.8  Chloride 98 - 111 mmol/L 106 96(L) 101  CO2 22 - 32 mmol/L 28 24 22   Calcium 8.9 - 10.3 mg/dL 9.9 8.6(L) 9.0  Total Protein 6.5 - 8.1 g/dL 8.8(H) - -  Total Bilirubin 0.3 - 1.2 mg/dL 0.2(L) - -  Alkaline Phos 38 - 126 U/L 61 - -  AST 15 - 41 U/L 31 - -  ALT 0 - 44 U/L 22 - -   Component     Latest Ref Rng & Units 03/06/2020  Iron     41 - 142 ug/dL 35 (L)  TIBC     236 - 444 ug/dL 448 (H)  Saturation Ratios     21 - 57 % 8 (L)  UIBC     120 - 384 ug/dL 413 (H)  Folate, Hemolysate     Not Estab. ng/mL 605.0  HCT     34.0 - 46.6 % 29.6 (L)  Folate, RBC     >498 ng/mL 2,044  Erythropoietin     2.6 - 18.5 mIU/mL 14.9  RA Latex Turbid.     <14.0 IU/mL 99.5 (H)  Vitamin B12     180 - 914 pg/mL 841  Ferritin     11 - 307 ng/mL 35  Sed Rate     0 - 22 mm/hr 22    RADIOGRAPHIC STUDIES: I have personally reviewed the radiological images as listed and agreed with the findings in the report. No results found.  ASSESSMENT & PLAN:   87 yo with   1) Chronic normocytic anemia Due to CKD + iron deficiency + anemia of chronic inflammation from possible RA  PLAN: -Discussed pt labwork  today, 03/20/20; WBC and PLT nml, Iron deficient and Sat Ratios at 8%. -Advised pt that anemia is caused by inflammation from CKD & likely rheumatoid arthritis -Advised pt that kidney is not making enough of erythropoietin protein. -Recommend pt f/u with PCP for Rheumatologist referral due to elevated rheumatoid levels. -Advised pt of need for Iron IV infusions (2 infusions 1 week apart) ASAP to quickly increase iron levels (goal if ferritin >=200) -if hgb <9 despite ferritin >200 -- might need to consider EPO. -Will see back in 2 months with labs   FOLLOW UP: IV Injectafer weekly x 2 doses ASAP RTC with Dr Irene Limbo with labs in 2 months     The total time spent in the appt was 20 minutes and more than 50% was on counseling and direct patient cares.  All of the patient's questions were answered with apparent  satisfaction. The patient knows to call the clinic with any problems, questions or concerns.    Sullivan Lone MD [place] AAHIVMS [facility] Hematology/Oncology Physician [facility]  (Office):       [phone] (Work cell):  [phone] (Fax):           [phone]  03/20/2020 3:54 PM   I, Yevette Edwards, am acting as a scribe for Dr. Sullivan Lone.   .I have reviewed the above documentation for accuracy and completeness, and I agree with the above. Brunetta Genera MD

## 2020-03-21 ENCOUNTER — Telehealth: Payer: Self-pay | Admitting: Hematology

## 2020-03-21 NOTE — Telephone Encounter (Signed)
Scheduled per 01/06 los, patient has been called and notified.

## 2020-03-24 ENCOUNTER — Other Ambulatory Visit: Payer: Self-pay | Admitting: Hematology

## 2020-03-24 DIAGNOSIS — D509 Iron deficiency anemia, unspecified: Secondary | ICD-10-CM | POA: Insufficient documentation

## 2020-03-24 DIAGNOSIS — D508 Other iron deficiency anemias: Secondary | ICD-10-CM

## 2020-03-25 ENCOUNTER — Inpatient Hospital Stay: Payer: Medicare Other

## 2020-03-25 ENCOUNTER — Other Ambulatory Visit: Payer: Self-pay

## 2020-03-25 VITALS — BP 164/60 | HR 62 | Temp 97.7°F | Resp 17

## 2020-03-25 DIAGNOSIS — Z79890 Hormone replacement therapy (postmenopausal): Secondary | ICD-10-CM | POA: Diagnosis not present

## 2020-03-25 DIAGNOSIS — R5383 Other fatigue: Secondary | ICD-10-CM | POA: Diagnosis not present

## 2020-03-25 DIAGNOSIS — D649 Anemia, unspecified: Secondary | ICD-10-CM | POA: Diagnosis not present

## 2020-03-25 DIAGNOSIS — R197 Diarrhea, unspecified: Secondary | ICD-10-CM | POA: Diagnosis not present

## 2020-03-25 DIAGNOSIS — I129 Hypertensive chronic kidney disease with stage 1 through stage 4 chronic kidney disease, or unspecified chronic kidney disease: Secondary | ICD-10-CM | POA: Diagnosis not present

## 2020-03-25 DIAGNOSIS — N182 Chronic kidney disease, stage 2 (mild): Secondary | ICD-10-CM | POA: Diagnosis not present

## 2020-03-25 DIAGNOSIS — D508 Other iron deficiency anemias: Secondary | ICD-10-CM

## 2020-03-25 MED ORDER — ACETAMINOPHEN 325 MG PO TABS
ORAL_TABLET | ORAL | Status: AC
  Filled 2020-03-25: qty 2

## 2020-03-25 MED ORDER — LORATADINE 10 MG PO TABS
10.0000 mg | ORAL_TABLET | Freq: Once | ORAL | Status: AC
  Administered 2020-03-25: 10 mg via ORAL

## 2020-03-25 MED ORDER — LORATADINE 10 MG PO TABS
ORAL_TABLET | ORAL | Status: AC
  Filled 2020-03-25: qty 1

## 2020-03-25 MED ORDER — SODIUM CHLORIDE 0.9 % IV SOLN
750.0000 mg | Freq: Once | INTRAVENOUS | Status: AC
  Administered 2020-03-25: 750 mg via INTRAVENOUS
  Filled 2020-03-25: qty 15

## 2020-03-25 MED ORDER — SODIUM CHLORIDE 0.9 % IV SOLN
Freq: Once | INTRAVENOUS | Status: AC
  Filled 2020-03-25: qty 250

## 2020-03-25 MED ORDER — ACETAMINOPHEN 325 MG PO TABS
650.0000 mg | ORAL_TABLET | Freq: Once | ORAL | Status: AC
  Administered 2020-03-25: 650 mg via ORAL

## 2020-03-25 NOTE — Patient Instructions (Signed)
Ferric carboxymaltose injection What is this medicine? FERRIC CARBOXYMALTOSE (ferr-ik car-box-ee-mol-toes) is an iron complex. Iron is used to make healthy red blood cells, which carry oxygen and nutrients throughout the body. This medicine is used to treat anemia in people with chronic kidney disease or people who cannot take iron by mouth. This medicine may be used for other purposes; ask your health care provider or pharmacist if you have questions. COMMON BRAND NAME(S): Injectafer What should I tell my health care provider before I take this medicine? They need to know if you have any of these conditions:  high levels of iron in the blood  liver disease  an unusual or allergic reaction to iron, other medicines, foods, dyes, or preservatives  pregnant or trying to get pregnant  breast-feeding How should I use this medicine? This medicine is for infusion into a vein. It is given by a health care professional in a hospital or clinic setting. Talk to your pediatrician regarding the use of this medicine in children. Special care may be needed. Overdosage: If you think you have taken too much of this medicine contact a poison control center or emergency room at once. NOTE: This medicine is only for you. Do not share this medicine with others. What if I miss a dose? Keep appointments for follow-up doses. It is important not to miss your dose. Call your care team if you are unable to keep an appointment. What may interact with this medicine? Do not take this medicine with any of the following medications:  deferoxamine  dimercaprol  other iron products This list may not describe all possible interactions. Give your health care provider a list of all the medicines, herbs, non-prescription drugs, or dietary supplements you use. Also tell them if you smoke, drink alcohol, or use illegal drugs. Some items may interact with your medicine. What should I watch for while using this  medicine? Visit your doctor or health care professional regularly. Tell your doctor if your symptoms do not start to get better or if they get worse. You may need blood work done while you are taking this medicine. You may need to follow a special diet. Talk to your doctor. Foods that contain iron include: whole grains/cereals, dried fruits, beans, or peas, leafy green vegetables, and organ meats (liver, kidney). What side effects may I notice from receiving this medicine? Side effects that you should report to your doctor or health care professional as soon as possible:  allergic reactions like skin rash, itching or hives, swelling of the face, lips, or tongue  dizziness  facial flushing Side effects that usually do not require medical attention (report to your doctor or health care professional if they continue or are bothersome):  changes in taste  constipation  headache  nausea, vomiting  pain, redness, or irritation at site where injected This list may not describe all possible side effects. Call your doctor for medical advice about side effects. You may report side effects to FDA at 1-800-FDA-1088. Where should I keep my medicine? This drug is given in a hospital or clinic and will not be stored at home. NOTE: This sheet is a summary. It may not cover all possible information. If you have questions about this medicine, talk to your doctor, pharmacist, or health care provider.  2021 Elsevier/Gold Standard (2020-02-12 14:00:47)  

## 2020-03-26 ENCOUNTER — Other Ambulatory Visit: Payer: Self-pay | Admitting: Physician Assistant

## 2020-03-26 DIAGNOSIS — R109 Unspecified abdominal pain: Secondary | ICD-10-CM | POA: Diagnosis not present

## 2020-03-26 DIAGNOSIS — R1084 Generalized abdominal pain: Secondary | ICD-10-CM

## 2020-03-26 DIAGNOSIS — R197 Diarrhea, unspecified: Secondary | ICD-10-CM | POA: Diagnosis not present

## 2020-03-26 DIAGNOSIS — K5289 Other specified noninfective gastroenteritis and colitis: Secondary | ICD-10-CM | POA: Diagnosis not present

## 2020-03-26 DIAGNOSIS — D509 Iron deficiency anemia, unspecified: Secondary | ICD-10-CM | POA: Diagnosis not present

## 2020-04-02 ENCOUNTER — Inpatient Hospital Stay: Payer: Medicare Other

## 2020-04-02 ENCOUNTER — Other Ambulatory Visit: Payer: Self-pay

## 2020-04-02 VITALS — BP 179/75 | HR 65 | Temp 98.0°F | Resp 18

## 2020-04-02 DIAGNOSIS — R197 Diarrhea, unspecified: Secondary | ICD-10-CM | POA: Diagnosis not present

## 2020-04-02 DIAGNOSIS — D649 Anemia, unspecified: Secondary | ICD-10-CM | POA: Diagnosis not present

## 2020-04-02 DIAGNOSIS — Z79890 Hormone replacement therapy (postmenopausal): Secondary | ICD-10-CM | POA: Diagnosis not present

## 2020-04-02 DIAGNOSIS — R5383 Other fatigue: Secondary | ICD-10-CM | POA: Diagnosis not present

## 2020-04-02 DIAGNOSIS — N182 Chronic kidney disease, stage 2 (mild): Secondary | ICD-10-CM | POA: Diagnosis not present

## 2020-04-02 DIAGNOSIS — I129 Hypertensive chronic kidney disease with stage 1 through stage 4 chronic kidney disease, or unspecified chronic kidney disease: Secondary | ICD-10-CM | POA: Diagnosis not present

## 2020-04-02 DIAGNOSIS — D508 Other iron deficiency anemias: Secondary | ICD-10-CM

## 2020-04-02 MED ORDER — ACETAMINOPHEN 325 MG PO TABS
ORAL_TABLET | ORAL | Status: AC
  Filled 2020-04-02: qty 2

## 2020-04-02 MED ORDER — LORATADINE 10 MG PO TABS
ORAL_TABLET | ORAL | Status: AC
  Filled 2020-04-02: qty 1

## 2020-04-02 MED ORDER — SODIUM CHLORIDE 0.9 % IV SOLN
Freq: Once | INTRAVENOUS | Status: AC
  Filled 2020-04-02: qty 250

## 2020-04-02 MED ORDER — SODIUM CHLORIDE 0.9 % IV SOLN
750.0000 mg | Freq: Once | INTRAVENOUS | Status: AC
  Administered 2020-04-02: 750 mg via INTRAVENOUS
  Filled 2020-04-02: qty 15

## 2020-04-02 MED ORDER — ACETAMINOPHEN 325 MG PO TABS
650.0000 mg | ORAL_TABLET | Freq: Once | ORAL | Status: AC
  Administered 2020-04-02: 650 mg via ORAL

## 2020-04-02 MED ORDER — LORATADINE 10 MG PO TABS
10.0000 mg | ORAL_TABLET | Freq: Once | ORAL | Status: AC
  Administered 2020-04-02: 10 mg via ORAL

## 2020-04-02 NOTE — Progress Notes (Signed)
Patient's blood pressure remained elevated upon reassessment of vital signs following Injectafer infusion. Sandi Mealy, PA-C notified. Per Tanner - patient's baseline at cancer center is elevated and patient okay to be discharged if asymptomatic. Patient does not complain of any symptoms at this time. Patient advised to continue taking blood pressure medicine as advised and to recheck blood pressure at least once a day at home. Patient stable upon discharge and transportation called.

## 2020-04-02 NOTE — Patient Instructions (Signed)
Ferric carboxymaltose injection What is this medicine? FERRIC CARBOXYMALTOSE (ferr-ik car-box-ee-mol-toes) is an iron complex. Iron is used to make healthy red blood cells, which carry oxygen and nutrients throughout the body. This medicine is used to treat anemia in people with chronic kidney disease or people who cannot take iron by mouth. This medicine may be used for other purposes; ask your health care provider or pharmacist if you have questions. COMMON BRAND NAME(S): Injectafer What should I tell my health care provider before I take this medicine? They need to know if you have any of these conditions:  high levels of iron in the blood  liver disease  an unusual or allergic reaction to iron, other medicines, foods, dyes, or preservatives  pregnant or trying to get pregnant  breast-feeding How should I use this medicine? This medicine is for infusion into a vein. It is given by a health care professional in a hospital or clinic setting. Talk to your pediatrician regarding the use of this medicine in children. Special care may be needed. Overdosage: If you think you have taken too much of this medicine contact a poison control center or emergency room at once. NOTE: This medicine is only for you. Do not share this medicine with others. What if I miss a dose? Keep appointments for follow-up doses. It is important not to miss your dose. Call your care team if you are unable to keep an appointment. What may interact with this medicine? Do not take this medicine with any of the following medications:  deferoxamine  dimercaprol  other iron products This list may not describe all possible interactions. Give your health care provider a list of all the medicines, herbs, non-prescription drugs, or dietary supplements you use. Also tell them if you smoke, drink alcohol, or use illegal drugs. Some items may interact with your medicine. What should I watch for while using this  medicine? Visit your doctor or health care professional regularly. Tell your doctor if your symptoms do not start to get better or if they get worse. You may need blood work done while you are taking this medicine. You may need to follow a special diet. Talk to your doctor. Foods that contain iron include: whole grains/cereals, dried fruits, beans, or peas, leafy green vegetables, and organ meats (liver, kidney). What side effects may I notice from receiving this medicine? Side effects that you should report to your doctor or health care professional as soon as possible:  allergic reactions like skin rash, itching or hives, swelling of the face, lips, or tongue  dizziness  facial flushing Side effects that usually do not require medical attention (report to your doctor or health care professional if they continue or are bothersome):  changes in taste  constipation  headache  nausea, vomiting  pain, redness, or irritation at site where injected This list may not describe all possible side effects. Call your doctor for medical advice about side effects. You may report side effects to FDA at 1-800-FDA-1088. Where should I keep my medicine? This drug is given in a hospital or clinic and will not be stored at home. NOTE: This sheet is a summary. It may not cover all possible information. If you have questions about this medicine, talk to your doctor, pharmacist, or health care provider.  2021 Elsevier/Gold Standard (2020-02-12 14:00:47)  

## 2020-04-03 ENCOUNTER — Ambulatory Visit: Payer: Medicare Other

## 2020-04-08 DIAGNOSIS — M4726 Other spondylosis with radiculopathy, lumbar region: Secondary | ICD-10-CM | POA: Diagnosis not present

## 2020-04-08 DIAGNOSIS — M9901 Segmental and somatic dysfunction of cervical region: Secondary | ICD-10-CM | POA: Diagnosis not present

## 2020-04-08 DIAGNOSIS — M9903 Segmental and somatic dysfunction of lumbar region: Secondary | ICD-10-CM | POA: Diagnosis not present

## 2020-04-08 DIAGNOSIS — M9902 Segmental and somatic dysfunction of thoracic region: Secondary | ICD-10-CM | POA: Diagnosis not present

## 2020-04-09 ENCOUNTER — Ambulatory Visit
Admission: RE | Admit: 2020-04-09 | Discharge: 2020-04-09 | Disposition: A | Discharge status: Home / Self Care | Payer: Medicare Other | Source: Ambulatory Visit | Attending: Physician Assistant | Admitting: Physician Assistant

## 2020-04-09 DIAGNOSIS — D509 Iron deficiency anemia, unspecified: Secondary | ICD-10-CM | POA: Diagnosis not present

## 2020-04-09 DIAGNOSIS — R1084 Generalized abdominal pain: Secondary | ICD-10-CM

## 2020-04-09 DIAGNOSIS — R109 Unspecified abdominal pain: Secondary | ICD-10-CM | POA: Diagnosis not present

## 2020-04-09 DIAGNOSIS — K449 Diaphragmatic hernia without obstruction or gangrene: Secondary | ICD-10-CM | POA: Diagnosis not present

## 2020-04-21 DIAGNOSIS — M9901 Segmental and somatic dysfunction of cervical region: Secondary | ICD-10-CM | POA: Diagnosis not present

## 2020-04-21 DIAGNOSIS — M9902 Segmental and somatic dysfunction of thoracic region: Secondary | ICD-10-CM | POA: Diagnosis not present

## 2020-04-21 DIAGNOSIS — M4726 Other spondylosis with radiculopathy, lumbar region: Secondary | ICD-10-CM | POA: Diagnosis not present

## 2020-04-21 DIAGNOSIS — M9903 Segmental and somatic dysfunction of lumbar region: Secondary | ICD-10-CM | POA: Diagnosis not present

## 2020-04-23 ENCOUNTER — Other Ambulatory Visit: Payer: Self-pay | Admitting: Gastroenterology

## 2020-04-23 DIAGNOSIS — R195 Other fecal abnormalities: Secondary | ICD-10-CM | POA: Diagnosis not present

## 2020-04-23 DIAGNOSIS — K529 Noninfective gastroenteritis and colitis, unspecified: Secondary | ICD-10-CM | POA: Diagnosis not present

## 2020-04-23 DIAGNOSIS — K449 Diaphragmatic hernia without obstruction or gangrene: Secondary | ICD-10-CM | POA: Diagnosis not present

## 2020-04-23 DIAGNOSIS — D509 Iron deficiency anemia, unspecified: Secondary | ICD-10-CM | POA: Diagnosis not present

## 2020-05-05 DIAGNOSIS — M9901 Segmental and somatic dysfunction of cervical region: Secondary | ICD-10-CM | POA: Diagnosis not present

## 2020-05-05 DIAGNOSIS — M9903 Segmental and somatic dysfunction of lumbar region: Secondary | ICD-10-CM | POA: Diagnosis not present

## 2020-05-05 DIAGNOSIS — M9902 Segmental and somatic dysfunction of thoracic region: Secondary | ICD-10-CM | POA: Diagnosis not present

## 2020-05-05 DIAGNOSIS — M4726 Other spondylosis with radiculopathy, lumbar region: Secondary | ICD-10-CM | POA: Diagnosis not present

## 2020-05-07 ENCOUNTER — Other Ambulatory Visit: Payer: Self-pay

## 2020-05-07 ENCOUNTER — Encounter: Payer: Self-pay | Admitting: Podiatry

## 2020-05-07 ENCOUNTER — Ambulatory Visit (INDEPENDENT_AMBULATORY_CARE_PROVIDER_SITE_OTHER): Payer: Medicare Other | Admitting: Podiatry

## 2020-05-07 DIAGNOSIS — M2031 Hallux varus (acquired), right foot: Secondary | ICD-10-CM

## 2020-05-07 DIAGNOSIS — L84 Corns and callosities: Secondary | ICD-10-CM

## 2020-05-07 DIAGNOSIS — M79675 Pain in left toe(s): Secondary | ICD-10-CM

## 2020-05-07 DIAGNOSIS — M79674 Pain in right toe(s): Secondary | ICD-10-CM | POA: Diagnosis not present

## 2020-05-07 DIAGNOSIS — B351 Tinea unguium: Secondary | ICD-10-CM

## 2020-05-07 NOTE — Progress Notes (Signed)
This patient returns to the office for evaluation and treatment of long thick painful nails .  This patient is unable to trim his own nails since the patient cannot reach his feet.  Patient says the nails are painful walking and wearing his shoes.  He returns for preventive foot care services.  General Appearance  Alert, conversant and in no acute stress.  Vascular  Dorsalis pedis and posterior tibial  pulses are palpable  bilaterally.  Capillary return is within normal limits  bilaterally. Temperature is within normal limits  bilaterally.  Neurologic  Senn-Weinstein monofilament wire test within normal limits  bilaterally. Muscle power within normal limits bilaterally.  Nails Thick disfigured discolored nails with subungual debris  from hallux to fifth toes bilaterally. No evidence of bacterial infection or drainage bilaterally.  Orthopedic  No limitations of motion  feet .  No crepitus or effusions noted.  No bony pathology or digital deformities noted. 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  Skin  normotropic  bilaterally.  No signs of infections or ulcers noted.   Asymptomatic callus right hallux.  Clavi 3rd toe left foot.  Onychomycosis  Pain in toes right foot  Pain in toes left foot  Clavi left foot.  Debridement  of nails  1-5  B/L with a nail nipper.  Nails were then filed using a dremel tool with no incidents.  Debride clavi with # 15 blade.     RTC 9 weeks     Gardiner Barefoot DPM

## 2020-05-08 DIAGNOSIS — R600 Localized edema: Secondary | ICD-10-CM | POA: Diagnosis not present

## 2020-05-19 DIAGNOSIS — Z1159 Encounter for screening for other viral diseases: Secondary | ICD-10-CM | POA: Diagnosis not present

## 2020-05-19 DIAGNOSIS — M4726 Other spondylosis with radiculopathy, lumbar region: Secondary | ICD-10-CM | POA: Diagnosis not present

## 2020-05-19 DIAGNOSIS — M9903 Segmental and somatic dysfunction of lumbar region: Secondary | ICD-10-CM | POA: Diagnosis not present

## 2020-05-19 DIAGNOSIS — M9902 Segmental and somatic dysfunction of thoracic region: Secondary | ICD-10-CM | POA: Diagnosis not present

## 2020-05-19 DIAGNOSIS — M9901 Segmental and somatic dysfunction of cervical region: Secondary | ICD-10-CM | POA: Diagnosis not present

## 2020-05-19 DIAGNOSIS — Z20828 Contact with and (suspected) exposure to other viral communicable diseases: Secondary | ICD-10-CM | POA: Diagnosis not present

## 2020-05-20 ENCOUNTER — Other Ambulatory Visit: Payer: Medicare Other

## 2020-05-20 ENCOUNTER — Ambulatory Visit: Payer: Medicare Other | Admitting: Interventional Cardiology

## 2020-05-20 NOTE — Progress Notes (Signed)
HEMATOLOGY/ONCOLOGY CLINIC NOTE  Date of Service: 05/21/2020  Patient Care Team: Seward Carol, MD as PCP - General (Internal Medicine) Belva Crome, MD as PCP - Cardiology (Cardiology)  CHIEF COMPLAINTS/PURPOSE OF CONSULTATION:  Anemia  HISTORY OF PRESENTING ILLNESS:   [name] is a wonderful 87 y.o. female who has been referred to [place] by Dr. Delfina Redwood for evaluation and management of anemia. The pt reports that she is doing well overall.   The pt reports noticable fatigue. Pt has known of her anemia and iron deficiency for years and was previously placed on PO iron but could not tolerate it. She has previously tried Ferrous Sulfate which caused diarrhea.  A month ago the pt suddenly became unable to move her fingers, the next day her fingers were swollen. The day following the swelling her fingers returned to normal and regained their regular function. She has not been diagnosed with Rheumatoid Arthritis to her knowledge.  Three months ago the pt was having diarrhea for which she was given Betamethasone by GI. Pt is not sure what the diagnosis was. She was seen by Dr. Ronnette Juniper and is scheduled to follow up at the beginning of January. Her diarrhea is currently well-controlled with the steroids.   Pt is using Meloxicam as needed. She is on HRT, managed by Dr. Delilah Shan. Pt had a total hysterectomy at 30 due to significant pelvic pain.   She has previously had hyponatremia. She received a head CT in 2019 due to concussion and disturbed gait. Dr. Jannifer Franklin, Neurology, did not have concerns for low pressure hydrocephalus.   Pt has had both knees and her left hip replaced. She previously had regular Colonoscopies but discontinued due to numerous bowel adhesions.  Most recent lab results (02/26/2020) of CBC is as follows: all values are WNL except for RBC at 3.45, Hgb at 9.7, HCT at 30.0, Baso Rel at 1.1, Mono Abs at 0.9K. 01/16/2020 RF at 57.5 11/23/2019 Ferritin at 25.3  On  review of systems, pt reports fatigue and denies nose bleeds, gum bleeds, hematuria, bloody/black stools, new bone pain, new joint pain, unexpected weight loss, low appetite, diarrhea and any other symptoms.   On PMHx the pt reports Anemia, Arthritis, CKD stage II, Left Total Hip Replacement, Oophorectomy, Hysterectomy, B/L Total Knee Arthroplasty.  INTERVAL HISTORY: [name] is a wonderful 87 y.o. female who is here for evaluation and management of anemia. The patient's last visit with [place] was on 03/20/2020. The pt reports that she is doing well overall.  The pt reports that she tolerated the iron well. She notes no issues with allergies. The pt notes she is still fatigued and that the RA is not bothersome.    Lab results today 05/21/2020 of CBC w/diff and CMP is as follows: all values are WNL except for RBC of 3.54, Hgb of 10.4, HCT of 32.1, RDW of 15.9, Eosinophils Absolute of 0.6K, Basophils Absolute of 0.2K. CMP pending. 05/21/2020 Ferritin 579 05/21/2020 Iron sat 21% 05/21/2020 Folate RBC wnl  On review of systems, pt reports fatigue and denies changes in bowel habits, diarrhea, bleeding issues, black/bloody stools, decreased appetite, leg swelling, joint pain/issues, and any other symptoms.  MEDICAL HISTORY:  Past Medical History:  Diagnosis Date  . Anemia    CHRONIC  . Aortic insufficiency    a. 2D echo 08/29/15: EF 55-60%, mild LVH, diastolic dysfunction, elevated LV filling pressure, mild AI, severe LAE, mild RAE, mild TR, dilated descending thoracic aorta just distal  to the takeoff of the left subclavian, measuring 4.1 cm, no pericardial effusion.  . Arthritis    OA-SOME BACK AND NECK PAIN,  GOES TO CHIROPRACTOR TWICE A MONTH;  HX OF JOINT REPLACEMENTS   . Cancer ([place])    SKIN CANCERS REMOVED FROM LEGS  . CKD (chronic kidney disease), stage II   . Essential hypertension   . Gait abnormality 09/08/2017  . GERD (gastroesophageal reflux disease)   . PSVT (paroxysmal  supraventricular tachycardia) ([place])   . Recurrent Pericarditis    RECURRENT   . Thoracic aneurysm    a. Followed by [facility] -last seen in 12/2013 w/ plan for f/u MRA and thoracic surgery f/u in 2 yrs, but patient does not wish to follow any longer.    SURGICAL HISTORY: Past Surgical History:  Procedure Laterality Date  . APPENDECTOMY    . BREAST EXCISIONAL BIOPSY Left 1992   benign  . EXCISION/RELEASE BURSA HIP Left 11/24/2012   Procedure: LEFT HIP BURSECTOMY AND TENDON REPAIR ;  Surgeon: Gearlean Alf, MD;  Location: [facility];  Service: Orthopedics;  Laterality: Left;  . HIP CLOSED REDUCTION Left 12/19/2012   Procedure: CLOSED MANIPULATION HIP;  Surgeon: Mauri Pole, MD;  Location: [facility];  Service: Orthopedics;  Laterality: Left;  . HIP CLOSED REDUCTION Left 12/05/2013   Procedure: CLOSED MANIPULATION HIP;  Surgeon: Augustin Schooling, MD;  Location: [facility];  Service: Orthopedics;  Laterality: Left;  . HIP CLOSED REDUCTION Left 09/22/2015   Procedure: CLOSED REDUCTION HIP;  Surgeon: Melina Schools, MD;  Location: [facility];  Service: Orthopedics;  Laterality: Left;  . JOINT REPLACEMENT     LEFT TOTAL HIP REPLACEMENT AND REVISIONS X 2  . OOPHORECTOMY     has a partial of one ovary remaingin  . PELVIC LAPAROSCOPY     ovarian cyst removal,   . REVISION TOTAL HIP ARTHROPLASTY     left  . right Achilles Tendon repair  5/13  . TOTAL KNEE ARTHROPLASTY     right  . TOTAL KNEE ARTHROPLASTY     left  . VAGINAL HYSTERECTOMY     with ovarian cyst removal    SOCIAL HISTORY: Social History   Socioeconomic History  . Marital status: Widowed    Spouse name: Not on file  . Number of children: Not on file  . Years of education: Not on file  . Highest education level: Not on file  Occupational History  . Not on file  Tobacco Use  . Smoking status: Former Smoker    Types: Cigarettes  . Smokeless tobacco: Never Used  . Tobacco comment: quit age 53  Vaping Use  . Vaping Use: Never used   Substance and Sexual Activity  . Alcohol use: No    Alcohol/week: 0.0 standard drinks  . Drug use: No  . Sexual activity: Not Currently    Birth control/protection: Surgical    Comment: HYST-1st intercourse 87 yo-Fewer than 5 partners  Other Topics Concern  . Not on file  Social History Narrative  . Not on file   Social Determinants of Health   Financial Resource Strain: Not on file  Food Insecurity: Not on file  Transportation Needs: Not on file  Physical Activity: Not on file  Stress: Not on file  Social Connections: Not on file  Intimate Partner Violence: Not on file    FAMILY HISTORY: Family History  Problem Relation Age of Onset  . Hypertension Mother   . Stroke Mother   . Heart  attack Father 64  . ALS Other   . Breast cancer Neg Hx     ALLERGIES:  is allergic to keflex [cephalexin], shellfish allergy, shellfish-derived products, clindamycin/lincomycin, lincomycin, penicillamine, sugar-protein-starch, tape, ciprofloxacin, sulfa antibiotics, and sulfites.  MEDICATIONS:  Current Outpatient Medications  Medication Sig Dispense Refill  . acetic acid-hydrocortisone (VOSOL-HC) OTIC solution     . amoxicillin (AMOXIL) 500 MG capsule SMARTSIG:4 Capsule(s) By Mouth    . aspirin 81 MG tablet Take 81 mg by mouth daily.    Marland Kitchen augmented betamethasone dipropionate (DIPROLENE-AF) 0.05 % cream Apply 1 application topically 2 (two) times daily.    . budesonide (ENTOCORT EC) 3 MG 24 hr capsule Take 9 mg by mouth daily.    . Calcium Carbonate-Vit D-Min (CALCIUM 1200 PO) Take 1 tablet by mouth daily.     . carboxymethylcellulose (REFRESH PLUS) 0.5 % SOLN Place 1 drop into both eyes daily.    . carvedilol (COREG) 12.5 MG tablet Take 12.5 mg by mouth. 1/2 in the Am; 1 in the PM    . cholecalciferol (VITAMIN D) 1000 UNITS tablet Take 1,000 Units by mouth daily.    . clobetasol cream (TEMOVATE) 9.38 % Apply 1 application topically 2 (two) times daily as needed (itching).     .  colestipol (COLESTID) 1 g tablet Take 2 g by mouth 2 (two) times daily as needed.    . diltiazem (CARDIZEM CD) 240 MG 24 hr capsule Take 1 capsule (240 mg total) by mouth daily. 90 capsule 3  . ferrous sulfate 325 (65 FE) MG tablet Take 325 mg by mouth daily.    . meloxicam (MOBIC) 7.5 MG tablet Take 7.5 mg by mouth daily as needed for pain.     Marland Kitchen omega-3 acid ethyl esters (LOVAZA) 1 g capsule Take 1 g by mouth daily.     Marland Kitchen omeprazole (PRILOSEC) 40 MG capsule Take 40 mg by mouth daily.    Marland Kitchen oxybutynin (DITROPAN) 5 MG tablet Take by mouth.    Marland Kitchen PREMARIN 0.3 MG tablet Take 1 tablet (0.3 mg total) by mouth daily. 90 tablet 3  . sodium chloride 1 g tablet Take 1 g by mouth daily.    Marland Kitchen tretinoin (RETIN-A) 0.1 % cream SMARTSIG:Sparingly Topical Every Night PRN     No current facility-administered medications for this visit.    REVIEW OF SYSTEMS:   10 Point review of Systems was done is negative except as noted above.  PHYSICAL EXAMINATION: ECOG PERFORMANCE STATUS: 0 - Asymptomatic  .BP (!) 185/78 (BP Location: Left Arm, Patient Position: Sitting)   Pulse 68   Temp (!) 97.5 F (36.4 C) (Tympanic)   Resp 18   Ht 5\' 4"  (1.626 m)   Wt 154 lb 9.6 oz (70.1 kg)   SpO2 96%   BMI 26.54 kg/m   Exam was given in a chair.   GENERAL:alert, in no acute distress and comfortable SKIN: no acute rashes, no significant lesions EYES: conjunctiva are pink and non-injected, sclera anicteric OROPHARYNX: MMM, no exudates, no oropharyngeal erythema or ulceration NECK: supple, no JVD LYMPH:  no palpable lymphadenopathy in the cervical, axillary or inguinal regions LUNGS: clear to auscultation b/l with normal respiratory effort HEART: regular rate & rhythm ABDOMEN:  normoactive bowel sounds , non tender, not distended. Extremity: no pedal edema PSYCH: alert & oriented x 3 with fluent speech NEURO: no focal motor/sensory deficits  LABORATORY DATA:   I have reviewed the data as listed  . CBC Latest  Ref Rng &  Units 05/21/2020 05/21/2020 03/06/2020  WBC 4.0 - 10.5 K/uL 8.9 - 9.5  Hemoglobin 12.0 - 15.0 g/dL 10.4(L) - 9.5(L)  Hematocrit 36.0 - 46.0 % 32.1(L) 33.0(L) 31.0(L)  Platelets 150 - 400 K/uL 225 - 281   . CBC    Component Value Date/Time   WBC 8.9 05/21/2020 1447   WBC 9.5 03/06/2020 1535   RBC 3.54 (L) 05/21/2020 1447   HGB 10.4 (L) 05/21/2020 1447   HGB 10.8 (L) 12/26/2007 1539   HCT 32.1 (L) 05/21/2020 1447   HCT 33.0 (L) 05/21/2020 1446   HCT 33.4 (L) 12/26/2007 1539   PLT 225 05/21/2020 1447   PLT 314 12/26/2007 1539   MCV 90.7 05/21/2020 1447   MCV 88.5 12/26/2007 1539   MCH 29.4 05/21/2020 1447   MCHC 32.4 05/21/2020 1447   RDW 15.9 (H) 05/21/2020 1447   RDW 15.3 (H) 12/26/2007 1539   LYMPHSABS 1.5 05/21/2020 1447   LYMPHSABS 2.2 12/26/2007 1539   MONOABS 1.0 05/21/2020 1447   MONOABS 0.8 12/26/2007 1539   EOSABS 0.6 (H) 05/21/2020 1447   EOSABS 0.6 (H) 12/26/2007 1539   BASOSABS 0.2 (H) 05/21/2020 1447   BASOSABS 0.1 12/26/2007 1539     . CMP Latest Ref Rng & Units 05/21/2020 03/06/2020 11/12/2016  Glucose 70 - 99 mg/dL 99 97 95  BUN 8 - 23 mg/dL 36(H) 36(H) 24(H)  Creatinine 0.44 - 1.00 mg/dL 1.11(H) 1.34(H) 0.89  Sodium 135 - 145 mmol/L 134(L) 139 128(L)  Potassium 3.5 - 5.1 mmol/L 4.7 4.6 4.2  Chloride 98 - 111 mmol/L 103 106 96(L)  CO2 22 - 32 mmol/L 24 28 24   Calcium 8.9 - 10.3 mg/dL 8.9 9.9 8.6(L)  Total Protein 6.5 - 8.1 g/dL 7.5 8.8(H) -  Total Bilirubin 0.3 - 1.2 mg/dL <0.2(L) 0.2(L) -  Alkaline Phos 38 - 126 U/L 56 61 -  AST 15 - 41 U/L 24 31 -  ALT 0 - 44 U/L 22 22 -   Component     Latest Ref Rng & Units 03/06/2020  Iron     41 - 142 ug/dL 35 (L)  TIBC     236 - 444 ug/dL 448 (H)  Saturation Ratios     21 - 57 % 8 (L)  UIBC     120 - 384 ug/dL 413 (H)  Folate, Hemolysate     Not Estab. ng/mL 605.0  HCT     34.0 - 46.6 % 29.6 (L)  Folate, RBC     >498 ng/mL 2,044  Erythropoietin     2.6 - 18.5 mIU/mL 14.9  RA Latex  Turbid.     <14.0 IU/mL 99.5 (H)  Vitamin B12     180 - 914 pg/mL 841  Ferritin     11 - 307 ng/mL 35  Sed Rate     0 - 22 mm/hr 22   . Lab Results  Component Value Date   IRON 62 05/21/2020   TIBC 288 05/21/2020   IRONPCTSAT 21 05/21/2020   (Iron and TIBC)  Lab Results  Component Value Date   FERRITIN 579 (H) 05/21/2020     RADIOGRAPHIC STUDIES: I have personally reviewed the radiological images as listed and agreed with the findings in the report. No results found.  ASSESSMENT & PLAN:   87 yo with   1) Chronic normocytic anemia Due to CKD + iron deficiency + anemia of chronic inflammation from possible RA  PLAN: -Discussed pt labwork today, 05/21/2020; mild anemia improved  from iron.  -ferritin and iron sat WNL - no indication for additional IV Iron at this time. -Advised pt that remaining mild anemia is due to chronic kidney disease and inflammation from her rheumatoid arthritis. -Advised pt that there is no need for Erythropoietin shot at this time.  -Discussed potential of discharge if iron is stable following next visit.  -Will see back in 6 months with labs.    FOLLOW UP: RTC with Dr Irene Limbo with labs in 6 months     The total time spent in the appointment was 20 minutes and more than 50% was on counseling and direct patient cares.   All of the patient's questions were answered with apparent satisfaction. The patient knows to call the clinic with any problems, questions or concerns.    Sullivan Lone MD [place] [facility] Hematology/Oncology Physician [facility]  (Office):       [phone] (Work cell):  [phone] (Fax):           [phone]  05/21/2020 3:10 PM   I, Reinaldo Raddle, am acting as scribe for Dr. Sullivan Lone, MD.    .I have reviewed the above documentation for accuracy and completeness, and I agree with the above. Brunetta Genera MD

## 2020-05-21 ENCOUNTER — Inpatient Hospital Stay: Payer: Medicare Other | Attending: Hematology

## 2020-05-21 ENCOUNTER — Inpatient Hospital Stay (HOSPITAL_BASED_OUTPATIENT_CLINIC_OR_DEPARTMENT_OTHER): Payer: Medicare Other | Admitting: Hematology

## 2020-05-21 ENCOUNTER — Other Ambulatory Visit: Payer: Self-pay

## 2020-05-21 ENCOUNTER — Other Ambulatory Visit: Payer: Self-pay | Admitting: *Deleted

## 2020-05-21 VITALS — BP 185/78 | HR 68 | Temp 97.5°F | Resp 18 | Ht 64.0 in | Wt 154.6 lb

## 2020-05-21 DIAGNOSIS — D508 Other iron deficiency anemias: Secondary | ICD-10-CM

## 2020-05-21 DIAGNOSIS — D649 Anemia, unspecified: Secondary | ICD-10-CM

## 2020-05-21 DIAGNOSIS — M069 Rheumatoid arthritis, unspecified: Secondary | ICD-10-CM | POA: Diagnosis not present

## 2020-05-21 DIAGNOSIS — I129 Hypertensive chronic kidney disease with stage 1 through stage 4 chronic kidney disease, or unspecified chronic kidney disease: Secondary | ICD-10-CM | POA: Diagnosis not present

## 2020-05-21 DIAGNOSIS — R197 Diarrhea, unspecified: Secondary | ICD-10-CM | POA: Insufficient documentation

## 2020-05-21 DIAGNOSIS — N182 Chronic kidney disease, stage 2 (mild): Secondary | ICD-10-CM | POA: Insufficient documentation

## 2020-05-21 DIAGNOSIS — D631 Anemia in chronic kidney disease: Secondary | ICD-10-CM | POA: Diagnosis not present

## 2020-05-21 DIAGNOSIS — R5383 Other fatigue: Secondary | ICD-10-CM | POA: Insufficient documentation

## 2020-05-21 DIAGNOSIS — Z79890 Hormone replacement therapy (postmenopausal): Secondary | ICD-10-CM | POA: Insufficient documentation

## 2020-05-21 DIAGNOSIS — Z823 Family history of stroke: Secondary | ICD-10-CM | POA: Insufficient documentation

## 2020-05-21 DIAGNOSIS — Z882 Allergy status to sulfonamides status: Secondary | ICD-10-CM | POA: Insufficient documentation

## 2020-05-21 DIAGNOSIS — Z9049 Acquired absence of other specified parts of digestive tract: Secondary | ICD-10-CM | POA: Insufficient documentation

## 2020-05-21 DIAGNOSIS — Z87891 Personal history of nicotine dependence: Secondary | ICD-10-CM | POA: Insufficient documentation

## 2020-05-21 DIAGNOSIS — R102 Pelvic and perineal pain: Secondary | ICD-10-CM | POA: Diagnosis not present

## 2020-05-21 DIAGNOSIS — Z8249 Family history of ischemic heart disease and other diseases of the circulatory system: Secondary | ICD-10-CM | POA: Diagnosis not present

## 2020-05-21 DIAGNOSIS — Z888 Allergy status to other drugs, medicaments and biological substances status: Secondary | ICD-10-CM | POA: Diagnosis not present

## 2020-05-21 DIAGNOSIS — Z79899 Other long term (current) drug therapy: Secondary | ICD-10-CM | POA: Insufficient documentation

## 2020-05-21 DIAGNOSIS — Z90721 Acquired absence of ovaries, unilateral: Secondary | ICD-10-CM | POA: Diagnosis not present

## 2020-05-21 DIAGNOSIS — E611 Iron deficiency: Secondary | ICD-10-CM | POA: Diagnosis not present

## 2020-05-21 DIAGNOSIS — Z7952 Long term (current) use of systemic steroids: Secondary | ICD-10-CM | POA: Insufficient documentation

## 2020-05-21 DIAGNOSIS — Z85828 Personal history of other malignant neoplasm of skin: Secondary | ICD-10-CM | POA: Insufficient documentation

## 2020-05-21 DIAGNOSIS — Z881 Allergy status to other antibiotic agents status: Secondary | ICD-10-CM | POA: Insufficient documentation

## 2020-05-21 LAB — CBC WITH DIFFERENTIAL (CANCER CENTER ONLY)
Abs Immature Granulocytes: 0.05 10*3/uL (ref 0.00–0.07)
Basophils Absolute: 0.2 10*3/uL — ABNORMAL HIGH (ref 0.0–0.1)
Basophils Relative: 2 %
Eosinophils Absolute: 0.6 10*3/uL — ABNORMAL HIGH (ref 0.0–0.5)
Eosinophils Relative: 7 %
HCT: 32.1 % — ABNORMAL LOW (ref 36.0–46.0)
Hemoglobin: 10.4 g/dL — ABNORMAL LOW (ref 12.0–15.0)
Immature Granulocytes: 1 %
Lymphocytes Relative: 17 %
Lymphs Abs: 1.5 10*3/uL (ref 0.7–4.0)
MCH: 29.4 pg (ref 26.0–34.0)
MCHC: 32.4 g/dL (ref 30.0–36.0)
MCV: 90.7 fL (ref 80.0–100.0)
Monocytes Absolute: 1 10*3/uL (ref 0.1–1.0)
Monocytes Relative: 12 %
Neutro Abs: 5.6 10*3/uL (ref 1.7–7.7)
Neutrophils Relative %: 61 %
Platelet Count: 225 10*3/uL (ref 150–400)
RBC: 3.54 MIL/uL — ABNORMAL LOW (ref 3.87–5.11)
RDW: 15.9 % — ABNORMAL HIGH (ref 11.5–15.5)
WBC Count: 8.9 10*3/uL (ref 4.0–10.5)
nRBC: 0 % (ref 0.0–0.2)

## 2020-05-21 LAB — CMP (CANCER CENTER ONLY)
ALT: 22 U/L (ref 0–44)
AST: 24 U/L (ref 15–41)
Albumin: 3.5 g/dL (ref 3.5–5.0)
Alkaline Phosphatase: 56 U/L (ref 38–126)
Anion gap: 7 (ref 5–15)
BUN: 36 mg/dL — ABNORMAL HIGH (ref 8–23)
CO2: 24 mmol/L (ref 22–32)
Calcium: 8.9 mg/dL (ref 8.9–10.3)
Chloride: 103 mmol/L (ref 98–111)
Creatinine: 1.11 mg/dL — ABNORMAL HIGH (ref 0.44–1.00)
GFR, Estimated: 48 mL/min — ABNORMAL LOW (ref >60)
Glucose, Bld: 99 mg/dL (ref 70–99)
Potassium: 4.7 mmol/L (ref 3.5–5.1)
Sodium: 134 mmol/L — ABNORMAL LOW (ref 135–145)
Total Bilirubin: <0.2 mg/dL — ABNORMAL LOW (ref 0.3–1.2)
Total Protein: 7.5 g/dL (ref 6.5–8.1)

## 2020-05-21 LAB — SAMPLE TO BLOOD BANK

## 2020-05-22 ENCOUNTER — Other Ambulatory Visit: Payer: Self-pay | Admitting: *Deleted

## 2020-05-22 ENCOUNTER — Ambulatory Visit: Payer: Medicare Other | Admitting: Hematology

## 2020-05-22 ENCOUNTER — Other Ambulatory Visit: Payer: Medicare Other

## 2020-05-22 LAB — FOLATE RBC
Folate, Hemolysate: >620 ng/mL
Folate, RBC: >1879 ng/mL (ref >498)
Hematocrit: 33 % — ABNORMAL LOW (ref 34.0–46.6)

## 2020-05-22 LAB — IRON AND TIBC
Iron: 62 ug/dL (ref 41–142)
Saturation Ratios: 21 % (ref 21–57)
TIBC: 288 ug/dL (ref 236–444)
UIBC: 226 ug/dL (ref 120–384)

## 2020-05-22 LAB — FERRITIN: Ferritin: 579 ng/mL — ABNORMAL HIGH (ref 11–307)

## 2020-05-22 MED ORDER — PREMARIN 0.3 MG PO TABS: 0.3000 mg | ORAL_TABLET | Freq: Every day | ORAL | 0 refills | Status: DC

## 2020-05-26 ENCOUNTER — Ambulatory Visit
Admission: RE | Admit: 2020-05-26 | Discharge: 2020-05-26 | Disposition: A | Discharge status: Home / Self Care | Payer: Medicare Other | Source: Ambulatory Visit | Attending: Gastroenterology | Admitting: Gastroenterology

## 2020-05-26 ENCOUNTER — Telehealth: Payer: Self-pay | Admitting: *Deleted

## 2020-05-26 DIAGNOSIS — K5730 Diverticulosis of large intestine without perforation or abscess without bleeding: Secondary | ICD-10-CM | POA: Diagnosis not present

## 2020-05-26 DIAGNOSIS — J984 Other disorders of lung: Secondary | ICD-10-CM | POA: Diagnosis not present

## 2020-05-26 DIAGNOSIS — K449 Diaphragmatic hernia without obstruction or gangrene: Secondary | ICD-10-CM | POA: Diagnosis not present

## 2020-05-26 DIAGNOSIS — J8410 Pulmonary fibrosis, unspecified: Secondary | ICD-10-CM | POA: Diagnosis not present

## 2020-05-26 DIAGNOSIS — R195 Other fecal abnormalities: Secondary | ICD-10-CM

## 2020-05-26 NOTE — Telephone Encounter (Signed)
PA done via Cover my meds for premarin 0.3 mg tablet, will wait for response from [facility].

## 2020-05-27 NOTE — Telephone Encounter (Signed)
Patient saw Dr.Kendall) medication for premarin 0.3 mg tablet has been denied by [facility]. Medication is a non formulary drug and she must try and fail estradiol Rx before this medication can be approved. Can Rx be switch to a form of estradiol? Please advise

## 2020-05-28 DIAGNOSIS — D509 Iron deficiency anemia, unspecified: Secondary | ICD-10-CM | POA: Diagnosis not present

## 2020-05-28 DIAGNOSIS — K529 Noninfective gastroenteritis and colitis, unspecified: Secondary | ICD-10-CM | POA: Diagnosis not present

## 2020-05-29 MED ORDER — ESTROGENS CONJUGATED 0.3 MG PO TABS: 0.3000 mg | ORAL_TABLET | Freq: Every day | ORAL | 1 refills | Status: DC

## 2020-05-29 NOTE — Telephone Encounter (Signed)
Patient asked if generic premarin 0.3 mg tablet could be sent to pharmacy. I did explain that Rx is on non-formulary and more than likely the generic won't be covered as well.

## 2020-05-29 NOTE — Telephone Encounter (Signed)
Left message for patient to call.

## 2020-05-29 NOTE — Telephone Encounter (Signed)
Yes, recomnmend Estradiol patch 0.05 weekly.  If prefers tablets, Estradiol 0.5 mg 1 tab PO daily.

## 2020-06-02 DIAGNOSIS — M9903 Segmental and somatic dysfunction of lumbar region: Secondary | ICD-10-CM | POA: Diagnosis not present

## 2020-06-02 DIAGNOSIS — M9901 Segmental and somatic dysfunction of cervical region: Secondary | ICD-10-CM | POA: Diagnosis not present

## 2020-06-02 DIAGNOSIS — M9902 Segmental and somatic dysfunction of thoracic region: Secondary | ICD-10-CM | POA: Diagnosis not present

## 2020-06-02 DIAGNOSIS — M4726 Other spondylosis with radiculopathy, lumbar region: Secondary | ICD-10-CM | POA: Diagnosis not present

## 2020-06-02 DIAGNOSIS — Z1159 Encounter for screening for other viral diseases: Secondary | ICD-10-CM | POA: Diagnosis not present

## 2020-06-02 DIAGNOSIS — Z20828 Contact with and (suspected) exposure to other viral communicable diseases: Secondary | ICD-10-CM | POA: Diagnosis not present

## 2020-06-03 ENCOUNTER — Telehealth: Payer: Self-pay

## 2020-06-03 ENCOUNTER — Other Ambulatory Visit: Payer: Self-pay

## 2020-06-03 NOTE — Telephone Encounter (Addendum)
Patient's insurance will not cover the Premarin 0.3mg  tab she has been on. Prior Authorization was denied. Letter says that the "Formulary Alternative is Estradiol.".  Patient called about this. What to recommend?  Rx to go to [facility]. I set her pharmacy on this.

## 2020-06-06 MED ORDER — ESTRADIOL 0.5 MG PO TABS: 0.5000 mg | ORAL_TABLET | Freq: Every day | ORAL | 2 refills | Status: DC

## 2020-06-06 NOTE — Telephone Encounter (Signed)
Patient informed, Rx sent to mail order pharmacy.

## 2020-06-06 NOTE — Telephone Encounter (Signed)
Recommend Estradiol 0.5 mg tab PO daily.  Reassure patient that this is better than her Premarin.  Estradiol is exactly what her ovaries used to produce.

## 2020-06-09 DIAGNOSIS — Z20828 Contact with and (suspected) exposure to other viral communicable diseases: Secondary | ICD-10-CM | POA: Diagnosis not present

## 2020-06-09 DIAGNOSIS — Z1159 Encounter for screening for other viral diseases: Secondary | ICD-10-CM | POA: Diagnosis not present

## 2020-06-10 ENCOUNTER — Emergency Department (HOSPITAL_COMMUNITY)
Admission: EM | Admit: 2020-06-10 | Discharge: 2020-06-10 | Disposition: A | Discharge status: Home / Self Care | Payer: Medicare Other | Attending: Emergency Medicine | Admitting: Emergency Medicine

## 2020-06-10 ENCOUNTER — Encounter (HOSPITAL_COMMUNITY): Payer: Self-pay

## 2020-06-10 ENCOUNTER — Other Ambulatory Visit: Payer: Self-pay

## 2020-06-10 DIAGNOSIS — Z96642 Presence of left artificial hip joint: Secondary | ICD-10-CM | POA: Insufficient documentation

## 2020-06-10 DIAGNOSIS — R58 Hemorrhage, not elsewhere classified: Secondary | ICD-10-CM | POA: Diagnosis not present

## 2020-06-10 DIAGNOSIS — N182 Chronic kidney disease, stage 2 (mild): Secondary | ICD-10-CM | POA: Diagnosis not present

## 2020-06-10 DIAGNOSIS — R509 Fever, unspecified: Secondary | ICD-10-CM | POA: Diagnosis not present

## 2020-06-10 DIAGNOSIS — I10 Essential (primary) hypertension: Secondary | ICD-10-CM | POA: Diagnosis not present

## 2020-06-10 DIAGNOSIS — Z96653 Presence of artificial knee joint, bilateral: Secondary | ICD-10-CM | POA: Insufficient documentation

## 2020-06-10 DIAGNOSIS — Z79899 Other long term (current) drug therapy: Secondary | ICD-10-CM | POA: Insufficient documentation

## 2020-06-10 DIAGNOSIS — Z7401 Bed confinement status: Secondary | ICD-10-CM | POA: Diagnosis not present

## 2020-06-10 DIAGNOSIS — I129 Hypertensive chronic kidney disease with stage 1 through stage 4 chronic kidney disease, or unspecified chronic kidney disease: Secondary | ICD-10-CM | POA: Diagnosis not present

## 2020-06-10 DIAGNOSIS — Z7982 Long term (current) use of aspirin: Secondary | ICD-10-CM | POA: Insufficient documentation

## 2020-06-10 DIAGNOSIS — S8011XA Contusion of right lower leg, initial encounter: Secondary | ICD-10-CM | POA: Insufficient documentation

## 2020-06-10 DIAGNOSIS — Y9239 Other specified sports and athletic area as the place of occurrence of the external cause: Secondary | ICD-10-CM | POA: Diagnosis not present

## 2020-06-10 DIAGNOSIS — R5381 Other malaise: Secondary | ICD-10-CM | POA: Diagnosis not present

## 2020-06-10 DIAGNOSIS — W010XXA Fall on same level from slipping, tripping and stumbling without subsequent striking against object, initial encounter: Secondary | ICD-10-CM | POA: Insufficient documentation

## 2020-06-10 DIAGNOSIS — S0990XA Unspecified injury of head, initial encounter: Secondary | ICD-10-CM | POA: Diagnosis not present

## 2020-06-10 DIAGNOSIS — Z85828 Personal history of other malignant neoplasm of skin: Secondary | ICD-10-CM | POA: Insufficient documentation

## 2020-06-10 DIAGNOSIS — S8991XA Unspecified injury of right lower leg, initial encounter: Secondary | ICD-10-CM | POA: Diagnosis present

## 2020-06-10 DIAGNOSIS — Z87891 Personal history of nicotine dependence: Secondary | ICD-10-CM | POA: Insufficient documentation

## 2020-06-10 DIAGNOSIS — W19XXXA Unspecified fall, initial encounter: Secondary | ICD-10-CM | POA: Diagnosis not present

## 2020-06-10 DIAGNOSIS — M2550 Pain in unspecified joint: Secondary | ICD-10-CM | POA: Diagnosis not present

## 2020-06-10 NOTE — ED Notes (Signed)
[facility] called for transport back to [place].

## 2020-06-10 NOTE — Discharge Instructions (Signed)
Please read and follow all provided instructions.  Your diagnoses today include:  1. Traumatic hematoma of right lower leg, initial encounter     Tests performed today include:  Vital signs. See below for your results today.   Medications prescribed:   None  Take any prescribed medications only as directed.  Home care instructions:   Follow any educational materials contained in this packet  Keep bandaging in place overnight tonight and until tomorrow  Please perform good wound care and rinse the area with mild soap and water between dressing changes  Follow R.I.C.E. Protocol:  R - rest your injury   I  - use ice on injury without applying directly to skin  C - compress injury with bandage or splint  E - elevate the injury as much as possible  Follow-up instructions: Please follow-up with your primary care provider or orthopedic doctor in the next 3 days for recheck.  Return instructions:   Please return if your toes or feet are numb or tingling, appear gray or blue, or you have severe pain (also elevate the leg and loosen splint or wrap if you were given one)  Please return to the Emergency Department if you experience worsening symptoms.   Please return if you have any other emergent concerns.  Additional Information:  Your vital signs today were: BP (!) 172/75   Pulse 66   Temp (!) 97.4 F (36.3 C) (Oral)   Resp 16   SpO2 100%  If your blood pressure (BP) was elevated above 135/85 this visit, please have this repeated by your doctor within one month. -------------- If prescribed crutches for your injury: use crutches with non-weight bearing for the first few days. Then, you may walk as the pain allows, or as instructed. Start gradually with weight bearing on the affected side. Once you can walk pain free, then try jogging. When you can run forwards, then you can try moving side-to-side. If you cannot walk without crutches in one week, you need a  re-check. --------------

## 2020-06-10 NOTE — ED Notes (Signed)
Patient was given a ham sandwich, applesauce, graham crackers and water.

## 2020-06-10 NOTE — ED Triage Notes (Signed)
EMS reports from [place] living, trip and fall at exercise class. Caught foot on chair and fell forward and weights she had in hand possibly struck right lower leg. Visible hematoma to lower right leg. Denies LOC, blood thinner, head strike or other injury.  BP 150/92 HR 74 RR 16 Sp02 96 RA

## 2020-06-10 NOTE — ED Provider Notes (Signed)
[facility] DEPT Provider Note   CSN: [id number] Arrival date & time: 06/10/20  1309     History No chief complaint on file.   [name] is a 87 y.o. female.  Patient presents the emergency department today by EMS after a blister broke on her right lower extremity and blood.  Patient sustained a fall at approximately 10 AM today when she tripped over a piece of equipment in an exercise class.  Shortly after she developed swelling in the leg which formed a blister.  This broke at around noon.  EMS was called.  Patient is not on any anticoagulation.  She does take a baby aspirin daily.  She complains of a stinging pain in her right lower extremity.  No numbness or tingling.  EMS estimates approximately 30 cc of blood on scene.  Patient denies hitting her head or sustaining other injuries.  She has been ambulatory without hip pain after the fall today. The onset of this condition was acute. The course is constant. Aggravating factors: none. Alleviating factors: none.          Past Medical History:  Diagnosis Date  . Anemia    CHRONIC  . Aortic insufficiency    a. 2D echo 08/29/15: EF 55-60%, mild LVH, diastolic dysfunction, elevated LV filling pressure, mild AI, severe LAE, mild RAE, mild TR, dilated descending thoracic aorta just distal to the takeoff of the left subclavian, measuring 4.1 cm, no pericardial effusion.  . Arthritis    OA-SOME BACK AND NECK PAIN,  GOES TO CHIROPRACTOR TWICE A MONTH;  HX OF JOINT REPLACEMENTS   . Cancer ([place])    SKIN CANCERS REMOVED FROM LEGS  . CKD (chronic kidney disease), stage II   . Essential hypertension   . Gait abnormality 09/08/2017  . GERD (gastroesophageal reflux disease)   . PSVT (paroxysmal supraventricular tachycardia) ([place])   . Recurrent Pericarditis    RECURRENT   . Thoracic aneurysm    a. Followed by [facility] -last seen in 12/2013 w/ plan for f/u MRA and thoracic surgery f/u in 2 yrs, but patient does  not wish to follow any longer.    Patient Active Problem List   Diagnosis Date Noted  . Iron deficiency anemia 03/24/2020  . Clavi 02/19/2020  . Lumbar radiculopathy 06/17/2019  . Pain due to onychomycosis of toenails of both feet 05/14/2019  . Mixed incontinence 03/10/2019  . Cervical spondylosis 06/07/2018  . DDD (degenerative disc disease), cervical 05/20/2018  . History of total knee replacement, left 04/13/2018  . History of total knee replacement, right 04/13/2018  . History of revision of total replacement of left hip joint 04/13/2018  . Gait abnormality 09/08/2017  . Chronic cystitis 07/05/2017  . Dysuria 07/05/2017  . Anemia 09/22/2015  . h/o pericarditis   . Essential hypertension   . Aortic insufficiency   . Posterior vitreous detachment, bilateral 06/05/2015  . Left wrist pain 05/16/2015  . Triquetral chip fracture, left, closed, initial encounter 05/16/2015  . Primary osteoarthritis of first carpometacarpal joint of right hand 02/11/2015  . Primary osteoarthritis of right wrist 02/11/2015  . Arthropathy 11/25/2014  . Subluxation 11/25/2014  . Hip dislocation, left ([place]) 12/05/2013  . Trochanteric bursitis of left hip 11/24/2012  . Aneurysm of thoracic aorta ([place])   . Nonexudative age-related macular degeneration 04/22/2011  . Posterior capsular opacification, right 04/22/2011  . Status post intraocular lens implant 04/22/2011    Past Surgical History:  Procedure Laterality Date  . APPENDECTOMY    .  BREAST EXCISIONAL BIOPSY Left 1992   benign  . EXCISION/RELEASE BURSA HIP Left 11/24/2012   Procedure: LEFT HIP BURSECTOMY AND TENDON REPAIR ;  Surgeon: Gearlean Alf, MD;  Location: [facility];  Service: Orthopedics;  Laterality: Left;  . HIP CLOSED REDUCTION Left 12/19/2012   Procedure: CLOSED MANIPULATION HIP;  Surgeon: Mauri Pole, MD;  Location: [facility];  Service: Orthopedics;  Laterality: Left;  . HIP CLOSED REDUCTION Left 12/05/2013   Procedure: CLOSED  MANIPULATION HIP;  Surgeon: Augustin Schooling, MD;  Location: [facility];  Service: Orthopedics;  Laterality: Left;  . HIP CLOSED REDUCTION Left 09/22/2015   Procedure: CLOSED REDUCTION HIP;  Surgeon: Melina Schools, MD;  Location: [facility];  Service: Orthopedics;  Laterality: Left;  . JOINT REPLACEMENT     LEFT TOTAL HIP REPLACEMENT AND REVISIONS X 2  . OOPHORECTOMY     has a partial of one ovary remaingin  . PELVIC LAPAROSCOPY     ovarian cyst removal,   . REVISION TOTAL HIP ARTHROPLASTY     left  . right Achilles Tendon repair  5/13  . TOTAL KNEE ARTHROPLASTY     right  . TOTAL KNEE ARTHROPLASTY     left  . VAGINAL HYSTERECTOMY     with ovarian cyst removal     OB History    Gravida  2   Para  2   Term      Preterm      AB      Living  2     SAB      IAB      Ectopic      Multiple      Live Births              Family History  Problem Relation Age of Onset  . Hypertension Mother   . Stroke Mother   . Heart attack Father 29  . ALS Other   . Breast cancer Neg Hx     Social History   Tobacco Use  . Smoking status: Former Smoker    Types: Cigarettes  . Smokeless tobacco: Never Used  . Tobacco comment: quit age 41  Vaping Use  . Vaping Use: Never used  Substance Use Topics  . Alcohol use: No    Alcohol/week: 0.0 standard drinks  . Drug use: No    Home Medications Prior to Admission medications   Medication Sig Start Date End Date Taking? Authorizing Provider  acetic acid-hydrocortisone (VOSOL-HC) OTIC solution  02/26/20   [provider]  amoxicillin (AMOXIL) 500 MG capsule SMARTSIG:4 Capsule(s) By Mouth 04/10/20   [provider]  aspirin 81 MG tablet Take 81 mg by mouth daily.    [provider]  augmented betamethasone dipropionate (DIPROLENE-AF) 0.05 % cream Apply 1 application topically 2 (two) times daily. 07/31/19   [provider]  budesonide (ENTOCORT EC) 3 MG 24 hr capsule Take 9 mg by mouth daily. 03/28/20    [provider]  Calcium Carbonate-Vit D-Min (CALCIUM 1200 PO) Take 1 tablet by mouth daily.     [provider]  carboxymethylcellulose (REFRESH PLUS) 0.5 % SOLN Place 1 drop into both eyes daily.    [provider]  carvedilol (COREG) 12.5 MG tablet Take 12.5 mg by mouth. 1/2 in the Am; 1 in the PM    [provider]  cholecalciferol (VITAMIN D) 1000 UNITS tablet Take 1,000 Units by mouth daily.    [provider]  clobetasol cream (  TEMOVATE) 0.63 % Apply 1 application topically 2 (two) times daily as needed (itching).  08/08/13   [provider]  colestipol (COLESTID) 1 g tablet Take 2 g by mouth 2 (two) times daily as needed. Patient not taking: Reported on 05/21/2020 07/17/19   [provider]  diltiazem (CARDIZEM CD) 240 MG 24 hr capsule Take 1 capsule (240 mg total) by mouth daily. 12/26/19   Belva Crome, MD  estradiol (ESTRACE) 0.5 MG tablet Take 1 tablet (0.5 mg total) by mouth daily. 06/06/20   Princess Bruins, MD  ferrous sulfate 325 (65 FE) MG tablet Take 325 mg by mouth daily. Patient not taking: Reported on 05/21/2020 01/24/20   [provider]  meloxicam (MOBIC) 7.5 MG tablet Take 7.5 mg by mouth daily as needed for pain.  03/21/15   [provider]  omega-3 acid ethyl esters (LOVAZA) 1 g capsule Take 1 g by mouth daily.     [provider]  omeprazole (PRILOSEC) 40 MG capsule Take 40 mg by mouth daily. Patient not taking: Reported on 05/21/2020 05/03/20   [provider]  oxybutynin (DITROPAN) 5 MG tablet Take by mouth. Patient not taking: Reported on 05/21/2020 01/31/20   [provider]  sodium chloride 1 g tablet Take 1 g by mouth daily. 08/21/19   [provider]  tretinoin (RETIN-A) 0.1 % cream SMARTSIG:Sparingly Topical Every Night PRN 04/29/20   [provider]    Allergies    Cephalexin, Shellfish allergy, Shellfish-derived products, Clindamycin/lincomycin,  Lincomycin, Other, Penicillamine, Sugar-protein-starch, Tape, Ciprofloxacin, Sulfa antibiotics, and Sulfites  Review of Systems   Review of Systems  Constitutional: Negative for activity change.  Musculoskeletal: Positive for myalgias. Negative for arthralgias, back pain, joint swelling and neck pain.  Skin: Positive for color change and wound.  Neurological: Negative for weakness and numbness.    Physical Exam Updated Vital Signs BP (!) 177/78 (BP Location: Left Arm)   Pulse 67   Temp (!) 97.4 F (36.3 C) (Oral)   Resp 13   SpO2 99%   Physical Exam Vitals and nursing note reviewed.  Constitutional:      Appearance: She is well-developed.  HENT:     Head: Normocephalic and atraumatic.  Eyes:     Conjunctiva/sclera: Conjunctivae normal.  Neck:     Comments: 2+ L DP pulse Pulmonary:     Effort: No respiratory distress.  Musculoskeletal:        General: Tenderness present.     Cervical back: Normal range of motion and neck supple.     Comments: Tenderness generally of right lower leg with ecchymosis and large hemorrhagic bullae lateral and inferior to knee. There is a small puncture on posterior aspect.   Skin:    General: Skin is warm and dry.     Comments: Hemorrhagic bullae irregular but approximately 15cm x 8cm, right lower leg as pictured.   Neurological:     Mental Status: She is alert.        ED Results / Procedures / Treatments   Labs (all labs ordered are listed, but only abnormal results are displayed) Labs Reviewed - No data to display  EKG None  Radiology No results found.  Procedures Procedures   Medications Ordered in ED Medications - No data to display  ED Course  I have reviewed the triage vital signs and the nursing notes.  Pertinent labs & imaging results that were available during my care of the patient were reviewed by me and  considered in my medical decision making (see chart for details).  Patient seen and examined. Work-up  initiated. Medications ordered.   Vital signs reviewed and are as follows: BP (!) 177/78 (BP Location: Left Arm)   Pulse 67   Temp (!) 97.4 F (36.3 C) (Oral)   Resp 13   SpO2 99%   2:14 PM Pt discussed with and seen by Dr. Maryan Rued.  Agree to avoid aspiration at this point.  Dressing placed with ABD pad, light compression with Coban.  2:48 PM Pt rechecked.  Bandage now in place.  No signs of developing compartment syndrome.  Pedal pulses remain normal.  Plan to leave bandaging on overnight, follow-up with PCP or orthopedist in the next 3 days, good wound care at home.    MDM Rules/Calculators/A&P                          Patient with traumatic hemorrhagic bullae of the right lower leg as above.  Bleeding appears to be controlled.  Bullae does not appear to be expanding.  No signs of compartment syndrome.  No anticoagulation.  Plan as above.   Final Clinical Impression(s) / ED Diagnoses Final diagnoses:  Traumatic hematoma of right lower leg, initial encounter    Rx / DC Orders ED Discharge Orders    None       Carlisle Cater, PA-C 06/10/20 1449    Blanchie Dessert, MD 06/10/20 1536

## 2020-06-12 DIAGNOSIS — S8011XA Contusion of right lower leg, initial encounter: Secondary | ICD-10-CM | POA: Diagnosis not present

## 2020-06-16 DIAGNOSIS — M4726 Other spondylosis with radiculopathy, lumbar region: Secondary | ICD-10-CM | POA: Diagnosis not present

## 2020-06-16 DIAGNOSIS — M9903 Segmental and somatic dysfunction of lumbar region: Secondary | ICD-10-CM | POA: Diagnosis not present

## 2020-06-16 DIAGNOSIS — M9901 Segmental and somatic dysfunction of cervical region: Secondary | ICD-10-CM | POA: Diagnosis not present

## 2020-06-16 DIAGNOSIS — D1801 Hemangioma of skin and subcutaneous tissue: Secondary | ICD-10-CM | POA: Diagnosis not present

## 2020-06-16 DIAGNOSIS — Z85828 Personal history of other malignant neoplasm of skin: Secondary | ICD-10-CM | POA: Diagnosis not present

## 2020-06-16 DIAGNOSIS — M9902 Segmental and somatic dysfunction of thoracic region: Secondary | ICD-10-CM | POA: Diagnosis not present

## 2020-06-17 DIAGNOSIS — M25562 Pain in left knee: Secondary | ICD-10-CM | POA: Diagnosis not present

## 2020-06-18 DIAGNOSIS — K529 Noninfective gastroenteritis and colitis, unspecified: Secondary | ICD-10-CM | POA: Diagnosis not present

## 2020-06-18 DIAGNOSIS — N189 Chronic kidney disease, unspecified: Secondary | ICD-10-CM | POA: Diagnosis not present

## 2020-06-18 DIAGNOSIS — N3020 Other chronic cystitis without hematuria: Secondary | ICD-10-CM | POA: Diagnosis not present

## 2020-06-18 DIAGNOSIS — H353190 Nonexudative age-related macular degeneration, unspecified eye, stage unspecified: Secondary | ICD-10-CM | POA: Diagnosis not present

## 2020-06-18 DIAGNOSIS — I319 Disease of pericardium, unspecified: Secondary | ICD-10-CM | POA: Diagnosis not present

## 2020-06-18 DIAGNOSIS — I471 Supraventricular tachycardia: Secondary | ICD-10-CM | POA: Diagnosis not present

## 2020-06-18 DIAGNOSIS — D631 Anemia in chronic kidney disease: Secondary | ICD-10-CM | POA: Diagnosis not present

## 2020-06-18 DIAGNOSIS — Z85820 Personal history of malignant melanoma of skin: Secondary | ICD-10-CM | POA: Diagnosis not present

## 2020-06-18 DIAGNOSIS — S81801D Unspecified open wound, right lower leg, subsequent encounter: Secondary | ICD-10-CM | POA: Diagnosis not present

## 2020-06-18 DIAGNOSIS — D509 Iron deficiency anemia, unspecified: Secondary | ICD-10-CM | POA: Diagnosis not present

## 2020-06-18 DIAGNOSIS — K219 Gastro-esophageal reflux disease without esophagitis: Secondary | ICD-10-CM | POA: Diagnosis not present

## 2020-06-18 DIAGNOSIS — M5030 Other cervical disc degeneration, unspecified cervical region: Secondary | ICD-10-CM | POA: Diagnosis not present

## 2020-06-18 DIAGNOSIS — I714 Abdominal aortic aneurysm, without rupture: Secondary | ICD-10-CM | POA: Diagnosis not present

## 2020-06-18 DIAGNOSIS — Z9181 History of falling: Secondary | ICD-10-CM | POA: Diagnosis not present

## 2020-06-18 DIAGNOSIS — M19031 Primary osteoarthritis, right wrist: Secondary | ICD-10-CM | POA: Diagnosis not present

## 2020-06-18 DIAGNOSIS — I712 Thoracic aortic aneurysm, without rupture: Secondary | ICD-10-CM | POA: Diagnosis not present

## 2020-06-18 DIAGNOSIS — Z87891 Personal history of nicotine dependence: Secondary | ICD-10-CM | POA: Diagnosis not present

## 2020-06-18 DIAGNOSIS — Z28311 Partially vaccinated for covid-19: Secondary | ICD-10-CM | POA: Diagnosis not present

## 2020-06-18 DIAGNOSIS — I351 Nonrheumatic aortic (valve) insufficiency: Secondary | ICD-10-CM | POA: Diagnosis not present

## 2020-06-18 DIAGNOSIS — M5416 Radiculopathy, lumbar region: Secondary | ICD-10-CM | POA: Diagnosis not present

## 2020-06-18 DIAGNOSIS — M47812 Spondylosis without myelopathy or radiculopathy, cervical region: Secondary | ICD-10-CM | POA: Diagnosis not present

## 2020-06-18 DIAGNOSIS — I129 Hypertensive chronic kidney disease with stage 1 through stage 4 chronic kidney disease, or unspecified chronic kidney disease: Secondary | ICD-10-CM | POA: Diagnosis not present

## 2020-06-19 ENCOUNTER — Telehealth: Payer: Self-pay

## 2020-06-19 DIAGNOSIS — D631 Anemia in chronic kidney disease: Secondary | ICD-10-CM | POA: Diagnosis not present

## 2020-06-19 DIAGNOSIS — I129 Hypertensive chronic kidney disease with stage 1 through stage 4 chronic kidney disease, or unspecified chronic kidney disease: Secondary | ICD-10-CM | POA: Diagnosis not present

## 2020-06-19 DIAGNOSIS — I351 Nonrheumatic aortic (valve) insufficiency: Secondary | ICD-10-CM | POA: Diagnosis not present

## 2020-06-19 DIAGNOSIS — N189 Chronic kidney disease, unspecified: Secondary | ICD-10-CM | POA: Diagnosis not present

## 2020-06-19 DIAGNOSIS — S81801D Unspecified open wound, right lower leg, subsequent encounter: Secondary | ICD-10-CM | POA: Diagnosis not present

## 2020-06-19 DIAGNOSIS — M5416 Radiculopathy, lumbar region: Secondary | ICD-10-CM | POA: Diagnosis not present

## 2020-06-19 MED ORDER — ESTRADIOL 0.5 MG PO TABS: 0.5000 mg | ORAL_TABLET | Freq: Every day | ORAL | 2 refills | Status: DC

## 2020-06-19 NOTE — Telephone Encounter (Signed)
Patient called stating that [facility] if out of stock of Estradiol. She asked if I could see if [facility] might have it. I called there and spoke with Ailene Ravel and confirmed that they have it. Rx sent. Patient informed.

## 2020-06-20 ENCOUNTER — Encounter (HOSPITAL_BASED_OUTPATIENT_CLINIC_OR_DEPARTMENT_OTHER): Payer: Self-pay | Admitting: Emergency Medicine

## 2020-06-20 ENCOUNTER — Other Ambulatory Visit: Payer: Self-pay

## 2020-06-20 ENCOUNTER — Emergency Department (HOSPITAL_BASED_OUTPATIENT_CLINIC_OR_DEPARTMENT_OTHER)
Admission: EM | Admit: 2020-06-20 | Discharge: 2020-06-20 | Disposition: A | Discharge status: Home / Self Care | Payer: Medicare Other | Attending: Emergency Medicine | Admitting: Emergency Medicine

## 2020-06-20 DIAGNOSIS — Z85828 Personal history of other malignant neoplasm of skin: Secondary | ICD-10-CM | POA: Insufficient documentation

## 2020-06-20 DIAGNOSIS — L089 Local infection of the skin and subcutaneous tissue, unspecified: Secondary | ICD-10-CM | POA: Insufficient documentation

## 2020-06-20 DIAGNOSIS — Z87891 Personal history of nicotine dependence: Secondary | ICD-10-CM | POA: Insufficient documentation

## 2020-06-20 DIAGNOSIS — S8011XD Contusion of right lower leg, subsequent encounter: Secondary | ICD-10-CM | POA: Diagnosis not present

## 2020-06-20 DIAGNOSIS — T148XXA Other injury of unspecified body region, initial encounter: Secondary | ICD-10-CM | POA: Diagnosis not present

## 2020-06-20 DIAGNOSIS — I10 Essential (primary) hypertension: Secondary | ICD-10-CM | POA: Diagnosis not present

## 2020-06-20 DIAGNOSIS — X58XXXD Exposure to other specified factors, subsequent encounter: Secondary | ICD-10-CM | POA: Insufficient documentation

## 2020-06-20 DIAGNOSIS — T8130XA Disruption of wound, unspecified, initial encounter: Secondary | ICD-10-CM | POA: Diagnosis not present

## 2020-06-20 DIAGNOSIS — Z96642 Presence of left artificial hip joint: Secondary | ICD-10-CM | POA: Diagnosis not present

## 2020-06-20 DIAGNOSIS — Z96653 Presence of artificial knee joint, bilateral: Secondary | ICD-10-CM | POA: Insufficient documentation

## 2020-06-20 DIAGNOSIS — Z4800 Encounter for change or removal of nonsurgical wound dressing: Secondary | ICD-10-CM | POA: Insufficient documentation

## 2020-06-20 DIAGNOSIS — N182 Chronic kidney disease, stage 2 (mild): Secondary | ICD-10-CM | POA: Insufficient documentation

## 2020-06-20 DIAGNOSIS — I129 Hypertensive chronic kidney disease with stage 1 through stage 4 chronic kidney disease, or unspecified chronic kidney disease: Secondary | ICD-10-CM | POA: Diagnosis not present

## 2020-06-20 DIAGNOSIS — W19XXXA Unspecified fall, initial encounter: Secondary | ICD-10-CM | POA: Diagnosis not present

## 2020-06-20 DIAGNOSIS — Z7982 Long term (current) use of aspirin: Secondary | ICD-10-CM | POA: Insufficient documentation

## 2020-06-20 DIAGNOSIS — Z79899 Other long term (current) drug therapy: Secondary | ICD-10-CM | POA: Insufficient documentation

## 2020-06-20 DIAGNOSIS — S8991XD Unspecified injury of right lower leg, subsequent encounter: Secondary | ICD-10-CM | POA: Diagnosis present

## 2020-06-20 NOTE — Discharge Instructions (Signed)
Follow-up with your primary care doctor and your orthopedic doctor.  Ideally should be seen tomorrow or on Monday for close recheck.  If you have any significant increase in swelling, redness, fever, chills, come back to ER for reassessment.

## 2020-06-20 NOTE — ED Triage Notes (Signed)
  Patient BIB [facility] for wound infection on R lower leg.  Patient had fall last week that caused big hematoma on R lower leg.  Started on doxycycline on 06/16/2020.  Patient was concerned because it started bleeding more and become more painful.  Pain 6/10, throbbing pain.

## 2020-06-20 NOTE — ED Provider Notes (Signed)
[facility] EMERGENCY DEPT Provider Note   CSN: [id number] Arrival date & time: 06/20/20  0018     History Chief Complaint  Patient presents with  . Wound Reassessment  . Wound Infection    [name] is a 87 y.o. female.  Presents to ER with concern for evaluation of her right leg.  Patient initially suffered a hematoma on her right leg on 3/29.  Patient states that she followed up with [facility] and her dermatologist.  Both providers recommended not opening up the hematoma.  States that she was started on an antibiotic on Monday due to some mild redness around the hematoma.  This has not significantly changed today.  She came to ER because she noted some bleeding and that the wound seem to be opening up.  No new injuries.  No fevers or chills.  She is not on anticoagulation.  HPI     Past Medical History:  Diagnosis Date  . Anemia    CHRONIC  . Aortic insufficiency    a. 2D echo 08/29/15: EF 55-60%, mild LVH, diastolic dysfunction, elevated LV filling pressure, mild AI, severe LAE, mild RAE, mild TR, dilated descending thoracic aorta just distal to the takeoff of the left subclavian, measuring 4.1 cm, no pericardial effusion.  . Arthritis    OA-SOME BACK AND NECK PAIN,  GOES TO CHIROPRACTOR TWICE A MONTH;  HX OF JOINT REPLACEMENTS   . Cancer ([place])    SKIN CANCERS REMOVED FROM LEGS  . CKD (chronic kidney disease), stage II   . Essential hypertension   . Gait abnormality 09/08/2017  . GERD (gastroesophageal reflux disease)   . PSVT (paroxysmal supraventricular tachycardia) ([place])   . Recurrent Pericarditis    RECURRENT   . Thoracic aneurysm    a. Followed by [facility] -last seen in 12/2013 w/ plan for f/u MRA and thoracic surgery f/u in 2 yrs, but patient does not wish to follow any longer.    Patient Active Problem List   Diagnosis Date Noted  . Iron deficiency anemia 03/24/2020  . Clavi 02/19/2020  . Lumbar radiculopathy 06/17/2019  . Pain due to  onychomycosis of toenails of both feet 05/14/2019  . Mixed incontinence 03/10/2019  . Cervical spondylosis 06/07/2018  . DDD (degenerative disc disease), cervical 05/20/2018  . History of total knee replacement, left 04/13/2018  . History of total knee replacement, right 04/13/2018  . History of revision of total replacement of left hip joint 04/13/2018  . Gait abnormality 09/08/2017  . Chronic cystitis 07/05/2017  . Dysuria 07/05/2017  . Anemia 09/22/2015  . h/o pericarditis   . Essential hypertension   . Aortic insufficiency   . Posterior vitreous detachment, bilateral 06/05/2015  . Left wrist pain 05/16/2015  . Triquetral chip fracture, left, closed, initial encounter 05/16/2015  . Primary osteoarthritis of first carpometacarpal joint of right hand 02/11/2015  . Primary osteoarthritis of right wrist 02/11/2015  . Arthropathy 11/25/2014  . Subluxation 11/25/2014  . Hip dislocation, left ([place]) 12/05/2013  . Trochanteric bursitis of left hip 11/24/2012  . Aneurysm of thoracic aorta ([place])   . Nonexudative age-related macular degeneration 04/22/2011  . Posterior capsular opacification, right 04/22/2011  . Status post intraocular lens implant 04/22/2011    Past Surgical History:  Procedure Laterality Date  . APPENDECTOMY    . BREAST EXCISIONAL BIOPSY Left 1992   benign  . EXCISION/RELEASE BURSA HIP Left 11/24/2012   Procedure: LEFT HIP BURSECTOMY AND TENDON REPAIR ;  Surgeon: Gearlean Alf, MD;  Location: [facility];  Service: Orthopedics;  Laterality: Left;  . HIP CLOSED REDUCTION Left 12/19/2012   Procedure: CLOSED MANIPULATION HIP;  Surgeon: Mauri Pole, MD;  Location: [facility];  Service: Orthopedics;  Laterality: Left;  . HIP CLOSED REDUCTION Left 12/05/2013   Procedure: CLOSED MANIPULATION HIP;  Surgeon: Augustin Schooling, MD;  Location: [facility];  Service: Orthopedics;  Laterality: Left;  . HIP CLOSED REDUCTION Left 09/22/2015   Procedure: CLOSED REDUCTION HIP;  Surgeon: Melina Schools, MD;  Location: [facility];  Service: Orthopedics;  Laterality: Left;  . JOINT REPLACEMENT     LEFT TOTAL HIP REPLACEMENT AND REVISIONS X 2  . OOPHORECTOMY     has a partial of one ovary remaingin  . PELVIC LAPAROSCOPY     ovarian cyst removal,   . REVISION TOTAL HIP ARTHROPLASTY     left  . right Achilles Tendon repair  5/13  . TOTAL KNEE ARTHROPLASTY     right  . TOTAL KNEE ARTHROPLASTY     left  . VAGINAL HYSTERECTOMY     with ovarian cyst removal     OB History    Gravida  2   Para  2   Term      Preterm      AB      Living  2     SAB      IAB      Ectopic      Multiple      Live Births              Family History  Problem Relation Age of Onset  . Hypertension Mother   . Stroke Mother   . Heart attack Father 75  . ALS Other   . Breast cancer Neg Hx     Social History   Tobacco Use  . Smoking status: Former Smoker    Types: Cigarettes  . Smokeless tobacco: Never Used  . Tobacco comment: quit age 88  Vaping Use  . Vaping Use: Never used  Substance Use Topics  . Alcohol use: No    Alcohol/week: 0.0 standard drinks  . Drug use: No    Home Medications Prior to Admission medications   Medication Sig Start Date End Date Taking? Authorizing Provider  acetic acid-hydrocortisone (VOSOL-HC) OTIC solution  02/26/20   [provider]  amoxicillin (AMOXIL) 500 MG capsule SMARTSIG:4 Capsule(s) By Mouth 04/10/20   [provider]  aspirin 81 MG tablet Take 81 mg by mouth daily.    [provider]  augmented betamethasone dipropionate (DIPROLENE-AF) 0.05 % cream Apply 1 application topically 2 (two) times daily. 07/31/19   [provider]  budesonide (ENTOCORT EC) 3 MG 24 hr capsule Take 9 mg by mouth daily. 03/28/20   [provider]  Calcium Carbonate-Vit D-Min (CALCIUM 1200 PO) Take 1 tablet by mouth daily.     [provider]  carboxymethylcellulose (REFRESH PLUS) 0.5 % SOLN Place 1 drop into  both eyes daily.    [provider]  carvedilol (COREG) 12.5 MG tablet Take 12.5 mg by mouth. 1/2 in the Am; 1 in the PM    [provider]  cholecalciferol (VITAMIN D) 1000 UNITS tablet Take 1,000 Units by mouth daily.    [provider]  clobetasol cream (TEMOVATE) 4.09 % Apply 1 application topically 2 (two) times daily as needed (itching).  08/08/13   [provider]  colestipol (COLESTID) 1 g tablet Take 2 g by mouth  2 (two) times daily as needed. Patient not taking: Reported on 05/21/2020 07/17/19   [provider]  diltiazem (CARDIZEM CD) 240 MG 24 hr capsule Take 1 capsule (240 mg total) by mouth daily. 12/26/19   Belva Crome, MD  estradiol (ESTRACE) 0.5 MG tablet Take 1 tablet (0.5 mg total) by mouth daily. 06/19/20   Princess Bruins, MD  ferrous sulfate 325 (65 FE) MG tablet Take 325 mg by mouth daily. Patient not taking: Reported on 05/21/2020 01/24/20   [provider]  meloxicam (MOBIC) 7.5 MG tablet Take 7.5 mg by mouth daily as needed for pain.  03/21/15   [provider]  omega-3 acid ethyl esters (LOVAZA) 1 g capsule Take 1 g by mouth daily.     [provider]  omeprazole (PRILOSEC) 40 MG capsule Take 40 mg by mouth daily. Patient not taking: Reported on 05/21/2020 05/03/20   [provider]  oxybutynin (DITROPAN) 5 MG tablet Take by mouth. Patient not taking: Reported on 05/21/2020 01/31/20   [provider]  sodium chloride 1 g tablet Take 1 g by mouth daily. 08/21/19   [provider]  tretinoin (RETIN-A) 0.1 % cream SMARTSIG:Sparingly Topical Every Night PRN 04/29/20   [provider]    Allergies    Cephalexin, Shellfish allergy, Shellfish-derived products, Clindamycin/lincomycin, Lincomycin, Other, Penicillamine, Sugar-protein-starch, Tape, Ciprofloxacin, Sulfa antibiotics, and Sulfites  Review of Systems   Review of Systems  Constitutional: Negative for chills and fever.   HENT: Negative for ear pain and sore throat.   Eyes: Negative for pain and visual disturbance.  Respiratory: Negative for cough and shortness of breath.   Cardiovascular: Negative for chest pain and palpitations.  Gastrointestinal: Negative for abdominal pain and vomiting.  Genitourinary: Negative for dysuria and hematuria.  Musculoskeletal: Positive for arthralgias. Negative for back pain.  Skin: Positive for wound. Negative for color change and rash.  Neurological: Negative for seizures and syncope.  All other systems reviewed and are negative.   Physical Exam Updated Vital Signs BP (!) 193/96 (BP Location: Left Arm)   Pulse 69   Temp 97.9 F (36.6 C) (Oral)   Resp 18   Ht 5\' 4"  (1.626 m)   Wt 67.1 kg   SpO2 99%   BMI 25.40 kg/m   Physical Exam Vitals and nursing note reviewed.  Constitutional:      General: She is not in acute distress.    Appearance: She is well-developed.  HENT:     Head: Normocephalic and atraumatic.  Eyes:     Conjunctiva/sclera: Conjunctivae normal.  Cardiovascular:     Rate and Rhythm: Normal rate.     Pulses: Normal pulses.  Pulmonary:     Effort: Pulmonary effort is normal. No respiratory distress.  Musculoskeletal:     Cervical back: Neck supple.     Comments: Over the right anterolateral lower leg there is a ~12x6 hematoma, the medial edge has slightly dehiscesed. There is no active bleeding noted; there is mild surround erythema that is blanchable, not warm to touch  Skin:    General: Skin is warm and dry.     Comments: Wound as above  Neurological:     Mental Status: She is alert.  Psychiatric:        Mood and Affect: Mood normal.     ED Results / Procedures / Treatments   Labs (all labs ordered are listed, but only abnormal results are displayed) Labs Reviewed - No data to display  EKG  None  Radiology No results found.  Procedures Procedures   Medications Ordered in ED Medications - No data to display  ED Course   I have reviewed the triage vital signs and the nursing notes.  Pertinent labs & imaging results that were available during my care of the patient were reviewed by me and considered in my medical decision making (see chart for details).    MDM Rules/Calculators/A&P                         87 year old lady came to ER because she was concerned that a large hematoma on her right leg had opened up.  The initial injury was over a week and a half ago.  No additional trauma today.  There is a slight dehiscence of the wound along the medial margin.  No active bleeding.  Provided local wound care, placed new bulky dressing.  There is mild erythema around the hematoma but no warmth, no fevers.  Patient is already on doxycycline.  Unclear if this is true cellulitis versus irritation/inflammation.  At present, believe she is appropriate for outpatient management.  Recommend that she continue the antibiotic, schedule a close appointment with her primary doctor and/or Ortho for reevaluation, ideally for wound check tomorrow.  Reviewed return precautions and discharged home.   After the discussed management above, the patient was determined to be safe for discharge.  The patient was in agreement with this plan and all questions regarding their care were answered.  ED return precautions were discussed and the patient will return to the ED with any significant worsening of condition.   Final Clinical Impression(s) / ED Diagnoses Final diagnoses:  Hematoma of right lower extremity, subsequent encounter    Rx / DC Orders ED Discharge Orders    None       Lucrezia Starch, MD 06/20/20 [phone]

## 2020-06-23 DIAGNOSIS — N189 Chronic kidney disease, unspecified: Secondary | ICD-10-CM | POA: Diagnosis not present

## 2020-06-23 DIAGNOSIS — Z1159 Encounter for screening for other viral diseases: Secondary | ICD-10-CM | POA: Diagnosis not present

## 2020-06-23 DIAGNOSIS — Z20828 Contact with and (suspected) exposure to other viral communicable diseases: Secondary | ICD-10-CM | POA: Diagnosis not present

## 2020-06-23 DIAGNOSIS — S81801D Unspecified open wound, right lower leg, subsequent encounter: Secondary | ICD-10-CM | POA: Diagnosis not present

## 2020-06-23 DIAGNOSIS — I351 Nonrheumatic aortic (valve) insufficiency: Secondary | ICD-10-CM | POA: Diagnosis not present

## 2020-06-23 DIAGNOSIS — I129 Hypertensive chronic kidney disease with stage 1 through stage 4 chronic kidney disease, or unspecified chronic kidney disease: Secondary | ICD-10-CM | POA: Diagnosis not present

## 2020-06-23 DIAGNOSIS — M5416 Radiculopathy, lumbar region: Secondary | ICD-10-CM | POA: Diagnosis not present

## 2020-06-23 DIAGNOSIS — D631 Anemia in chronic kidney disease: Secondary | ICD-10-CM | POA: Diagnosis not present

## 2020-06-24 DIAGNOSIS — M5416 Radiculopathy, lumbar region: Secondary | ICD-10-CM | POA: Diagnosis not present

## 2020-06-24 DIAGNOSIS — S81801D Unspecified open wound, right lower leg, subsequent encounter: Secondary | ICD-10-CM | POA: Diagnosis not present

## 2020-06-24 DIAGNOSIS — I351 Nonrheumatic aortic (valve) insufficiency: Secondary | ICD-10-CM | POA: Diagnosis not present

## 2020-06-24 DIAGNOSIS — I129 Hypertensive chronic kidney disease with stage 1 through stage 4 chronic kidney disease, or unspecified chronic kidney disease: Secondary | ICD-10-CM | POA: Diagnosis not present

## 2020-06-24 DIAGNOSIS — N189 Chronic kidney disease, unspecified: Secondary | ICD-10-CM | POA: Diagnosis not present

## 2020-06-24 DIAGNOSIS — D631 Anemia in chronic kidney disease: Secondary | ICD-10-CM | POA: Diagnosis not present

## 2020-06-26 DIAGNOSIS — D631 Anemia in chronic kidney disease: Secondary | ICD-10-CM | POA: Diagnosis not present

## 2020-06-26 DIAGNOSIS — S81801D Unspecified open wound, right lower leg, subsequent encounter: Secondary | ICD-10-CM | POA: Diagnosis not present

## 2020-06-26 DIAGNOSIS — I351 Nonrheumatic aortic (valve) insufficiency: Secondary | ICD-10-CM | POA: Diagnosis not present

## 2020-06-26 DIAGNOSIS — I129 Hypertensive chronic kidney disease with stage 1 through stage 4 chronic kidney disease, or unspecified chronic kidney disease: Secondary | ICD-10-CM | POA: Diagnosis not present

## 2020-06-26 DIAGNOSIS — M5416 Radiculopathy, lumbar region: Secondary | ICD-10-CM | POA: Diagnosis not present

## 2020-06-26 DIAGNOSIS — N189 Chronic kidney disease, unspecified: Secondary | ICD-10-CM | POA: Diagnosis not present

## 2020-06-27 DIAGNOSIS — S81801A Unspecified open wound, right lower leg, initial encounter: Secondary | ICD-10-CM | POA: Diagnosis not present

## 2020-06-30 DIAGNOSIS — I351 Nonrheumatic aortic (valve) insufficiency: Secondary | ICD-10-CM | POA: Diagnosis not present

## 2020-06-30 DIAGNOSIS — M9903 Segmental and somatic dysfunction of lumbar region: Secondary | ICD-10-CM | POA: Diagnosis not present

## 2020-06-30 DIAGNOSIS — M9902 Segmental and somatic dysfunction of thoracic region: Secondary | ICD-10-CM | POA: Diagnosis not present

## 2020-06-30 DIAGNOSIS — Z20828 Contact with and (suspected) exposure to other viral communicable diseases: Secondary | ICD-10-CM | POA: Diagnosis not present

## 2020-06-30 DIAGNOSIS — Z1159 Encounter for screening for other viral diseases: Secondary | ICD-10-CM | POA: Diagnosis not present

## 2020-06-30 DIAGNOSIS — S81801D Unspecified open wound, right lower leg, subsequent encounter: Secondary | ICD-10-CM | POA: Diagnosis not present

## 2020-06-30 DIAGNOSIS — I129 Hypertensive chronic kidney disease with stage 1 through stage 4 chronic kidney disease, or unspecified chronic kidney disease: Secondary | ICD-10-CM | POA: Diagnosis not present

## 2020-06-30 DIAGNOSIS — D631 Anemia in chronic kidney disease: Secondary | ICD-10-CM | POA: Diagnosis not present

## 2020-06-30 DIAGNOSIS — M9901 Segmental and somatic dysfunction of cervical region: Secondary | ICD-10-CM | POA: Diagnosis not present

## 2020-06-30 DIAGNOSIS — M4726 Other spondylosis with radiculopathy, lumbar region: Secondary | ICD-10-CM | POA: Diagnosis not present

## 2020-06-30 DIAGNOSIS — N189 Chronic kidney disease, unspecified: Secondary | ICD-10-CM | POA: Diagnosis not present

## 2020-06-30 DIAGNOSIS — M5416 Radiculopathy, lumbar region: Secondary | ICD-10-CM | POA: Diagnosis not present

## 2020-07-02 DIAGNOSIS — M5416 Radiculopathy, lumbar region: Secondary | ICD-10-CM | POA: Diagnosis not present

## 2020-07-02 DIAGNOSIS — I129 Hypertensive chronic kidney disease with stage 1 through stage 4 chronic kidney disease, or unspecified chronic kidney disease: Secondary | ICD-10-CM | POA: Diagnosis not present

## 2020-07-02 DIAGNOSIS — I351 Nonrheumatic aortic (valve) insufficiency: Secondary | ICD-10-CM | POA: Diagnosis not present

## 2020-07-02 DIAGNOSIS — S81801D Unspecified open wound, right lower leg, subsequent encounter: Secondary | ICD-10-CM | POA: Diagnosis not present

## 2020-07-02 DIAGNOSIS — D631 Anemia in chronic kidney disease: Secondary | ICD-10-CM | POA: Diagnosis not present

## 2020-07-02 DIAGNOSIS — N189 Chronic kidney disease, unspecified: Secondary | ICD-10-CM | POA: Diagnosis not present

## 2020-07-04 DIAGNOSIS — M5416 Radiculopathy, lumbar region: Secondary | ICD-10-CM | POA: Diagnosis not present

## 2020-07-04 DIAGNOSIS — I129 Hypertensive chronic kidney disease with stage 1 through stage 4 chronic kidney disease, or unspecified chronic kidney disease: Secondary | ICD-10-CM | POA: Diagnosis not present

## 2020-07-04 DIAGNOSIS — N189 Chronic kidney disease, unspecified: Secondary | ICD-10-CM | POA: Diagnosis not present

## 2020-07-04 DIAGNOSIS — S81801D Unspecified open wound, right lower leg, subsequent encounter: Secondary | ICD-10-CM | POA: Diagnosis not present

## 2020-07-04 DIAGNOSIS — I351 Nonrheumatic aortic (valve) insufficiency: Secondary | ICD-10-CM | POA: Diagnosis not present

## 2020-07-04 DIAGNOSIS — D631 Anemia in chronic kidney disease: Secondary | ICD-10-CM | POA: Diagnosis not present

## 2020-07-07 DIAGNOSIS — S81801D Unspecified open wound, right lower leg, subsequent encounter: Secondary | ICD-10-CM | POA: Diagnosis not present

## 2020-07-07 DIAGNOSIS — N189 Chronic kidney disease, unspecified: Secondary | ICD-10-CM | POA: Diagnosis not present

## 2020-07-07 DIAGNOSIS — Z20828 Contact with and (suspected) exposure to other viral communicable diseases: Secondary | ICD-10-CM | POA: Diagnosis not present

## 2020-07-07 DIAGNOSIS — Z1159 Encounter for screening for other viral diseases: Secondary | ICD-10-CM | POA: Diagnosis not present

## 2020-07-07 DIAGNOSIS — I129 Hypertensive chronic kidney disease with stage 1 through stage 4 chronic kidney disease, or unspecified chronic kidney disease: Secondary | ICD-10-CM | POA: Diagnosis not present

## 2020-07-07 DIAGNOSIS — I351 Nonrheumatic aortic (valve) insufficiency: Secondary | ICD-10-CM | POA: Diagnosis not present

## 2020-07-07 DIAGNOSIS — M5416 Radiculopathy, lumbar region: Secondary | ICD-10-CM | POA: Diagnosis not present

## 2020-07-07 DIAGNOSIS — D631 Anemia in chronic kidney disease: Secondary | ICD-10-CM | POA: Diagnosis not present

## 2020-07-09 DIAGNOSIS — M5416 Radiculopathy, lumbar region: Secondary | ICD-10-CM | POA: Diagnosis not present

## 2020-07-09 DIAGNOSIS — N189 Chronic kidney disease, unspecified: Secondary | ICD-10-CM | POA: Diagnosis not present

## 2020-07-09 DIAGNOSIS — I351 Nonrheumatic aortic (valve) insufficiency: Secondary | ICD-10-CM | POA: Diagnosis not present

## 2020-07-09 DIAGNOSIS — I129 Hypertensive chronic kidney disease with stage 1 through stage 4 chronic kidney disease, or unspecified chronic kidney disease: Secondary | ICD-10-CM | POA: Diagnosis not present

## 2020-07-09 DIAGNOSIS — S81801D Unspecified open wound, right lower leg, subsequent encounter: Secondary | ICD-10-CM | POA: Diagnosis not present

## 2020-07-09 DIAGNOSIS — D631 Anemia in chronic kidney disease: Secondary | ICD-10-CM | POA: Diagnosis not present

## 2020-07-10 DIAGNOSIS — H43813 Vitreous degeneration, bilateral: Secondary | ICD-10-CM | POA: Diagnosis not present

## 2020-07-10 DIAGNOSIS — H26491 Other secondary cataract, right eye: Secondary | ICD-10-CM | POA: Diagnosis not present

## 2020-07-10 DIAGNOSIS — H353132 Nonexudative age-related macular degeneration, bilateral, intermediate dry stage: Secondary | ICD-10-CM | POA: Diagnosis not present

## 2020-07-10 DIAGNOSIS — Z961 Presence of intraocular lens: Secondary | ICD-10-CM | POA: Diagnosis not present

## 2020-07-11 DIAGNOSIS — I129 Hypertensive chronic kidney disease with stage 1 through stage 4 chronic kidney disease, or unspecified chronic kidney disease: Secondary | ICD-10-CM | POA: Diagnosis not present

## 2020-07-11 DIAGNOSIS — N189 Chronic kidney disease, unspecified: Secondary | ICD-10-CM | POA: Diagnosis not present

## 2020-07-11 DIAGNOSIS — D631 Anemia in chronic kidney disease: Secondary | ICD-10-CM | POA: Diagnosis not present

## 2020-07-11 DIAGNOSIS — I351 Nonrheumatic aortic (valve) insufficiency: Secondary | ICD-10-CM | POA: Diagnosis not present

## 2020-07-11 DIAGNOSIS — M5416 Radiculopathy, lumbar region: Secondary | ICD-10-CM | POA: Diagnosis not present

## 2020-07-11 DIAGNOSIS — S81801D Unspecified open wound, right lower leg, subsequent encounter: Secondary | ICD-10-CM | POA: Diagnosis not present

## 2020-07-14 DIAGNOSIS — I129 Hypertensive chronic kidney disease with stage 1 through stage 4 chronic kidney disease, or unspecified chronic kidney disease: Secondary | ICD-10-CM | POA: Diagnosis not present

## 2020-07-14 DIAGNOSIS — S81801D Unspecified open wound, right lower leg, subsequent encounter: Secondary | ICD-10-CM | POA: Diagnosis not present

## 2020-07-14 DIAGNOSIS — N189 Chronic kidney disease, unspecified: Secondary | ICD-10-CM | POA: Diagnosis not present

## 2020-07-14 DIAGNOSIS — E871 Hypo-osmolality and hyponatremia: Secondary | ICD-10-CM | POA: Diagnosis not present

## 2020-07-14 DIAGNOSIS — M5416 Radiculopathy, lumbar region: Secondary | ICD-10-CM | POA: Diagnosis not present

## 2020-07-14 DIAGNOSIS — Z20828 Contact with and (suspected) exposure to other viral communicable diseases: Secondary | ICD-10-CM | POA: Diagnosis not present

## 2020-07-14 DIAGNOSIS — N1832 Chronic kidney disease, stage 3b: Secondary | ICD-10-CM | POA: Diagnosis not present

## 2020-07-14 DIAGNOSIS — D509 Iron deficiency anemia, unspecified: Secondary | ICD-10-CM | POA: Diagnosis not present

## 2020-07-14 DIAGNOSIS — Z1159 Encounter for screening for other viral diseases: Secondary | ICD-10-CM | POA: Diagnosis not present

## 2020-07-14 DIAGNOSIS — I10 Essential (primary) hypertension: Secondary | ICD-10-CM | POA: Diagnosis not present

## 2020-07-14 DIAGNOSIS — I351 Nonrheumatic aortic (valve) insufficiency: Secondary | ICD-10-CM | POA: Diagnosis not present

## 2020-07-14 DIAGNOSIS — K5289 Other specified noninfective gastroenteritis and colitis: Secondary | ICD-10-CM | POA: Diagnosis not present

## 2020-07-14 DIAGNOSIS — D631 Anemia in chronic kidney disease: Secondary | ICD-10-CM | POA: Diagnosis not present

## 2020-07-16 ENCOUNTER — Other Ambulatory Visit: Payer: Self-pay

## 2020-07-16 ENCOUNTER — Ambulatory Visit (INDEPENDENT_AMBULATORY_CARE_PROVIDER_SITE_OTHER): Payer: Medicare Other | Admitting: Podiatry

## 2020-07-16 ENCOUNTER — Encounter: Payer: Self-pay | Admitting: Podiatry

## 2020-07-16 DIAGNOSIS — M79674 Pain in right toe(s): Secondary | ICD-10-CM | POA: Diagnosis not present

## 2020-07-16 DIAGNOSIS — M79675 Pain in left toe(s): Secondary | ICD-10-CM

## 2020-07-16 DIAGNOSIS — B351 Tinea unguium: Secondary | ICD-10-CM | POA: Diagnosis not present

## 2020-07-16 DIAGNOSIS — M2031 Hallux varus (acquired), right foot: Secondary | ICD-10-CM

## 2020-07-16 NOTE — Progress Notes (Signed)
This patient returns to the office for evaluation and treatment of long thick painful nails .  This patient is unable to trim his own nails since the patient cannot reach his feet.  Patient says the nails are painful walking and wearing his shoes.  He returns for preventive foot care services.  General Appearance  Alert, conversant and in no acute stress.  Vascular  Dorsalis pedis and posterior tibial  pulses are palpable  bilaterally.  Capillary return is within normal limits  bilaterally. Temperature is within normal limits  bilaterally.  Neurologic  Senn-Weinstein monofilament wire test within normal limits  bilaterally. Muscle power within normal limits bilaterally.  Nails Thick disfigured discolored nails with subungual debris  from hallux to fifth toes bilaterally. No evidence of bacterial infection or drainage bilaterally.  Orthopedic  No limitations of motion  feet .  No crepitus or effusions noted.  No bony pathology or digital deformities noted. 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  Skin  normotropic  bilaterally.  No signs of infections or ulcers noted.   Asymptomatic callus right hallux.  Clavi 3rd toe left foot.  Onychomycosis  Pain in toes right foot  Pain in toes left foot  Clavi left foot.  Debridement  of nails  1-5  B/L with a nail nipper.  Nails were then filed using a dremel tool with no incidents.  Debride clavi with # 15 blade.     RTC 9 weeks     Alesia Oshields DPM  

## 2020-07-17 DIAGNOSIS — M5416 Radiculopathy, lumbar region: Secondary | ICD-10-CM | POA: Diagnosis not present

## 2020-07-17 DIAGNOSIS — D631 Anemia in chronic kidney disease: Secondary | ICD-10-CM | POA: Diagnosis not present

## 2020-07-17 DIAGNOSIS — N189 Chronic kidney disease, unspecified: Secondary | ICD-10-CM | POA: Diagnosis not present

## 2020-07-17 DIAGNOSIS — I129 Hypertensive chronic kidney disease with stage 1 through stage 4 chronic kidney disease, or unspecified chronic kidney disease: Secondary | ICD-10-CM | POA: Diagnosis not present

## 2020-07-17 DIAGNOSIS — R399 Unspecified symptoms and signs involving the genitourinary system: Secondary | ICD-10-CM | POA: Diagnosis not present

## 2020-07-17 DIAGNOSIS — I351 Nonrheumatic aortic (valve) insufficiency: Secondary | ICD-10-CM | POA: Diagnosis not present

## 2020-07-17 DIAGNOSIS — S81801D Unspecified open wound, right lower leg, subsequent encounter: Secondary | ICD-10-CM | POA: Diagnosis not present

## 2020-07-18 ENCOUNTER — Telehealth: Payer: Self-pay | Admitting: Hematology

## 2020-07-18 ENCOUNTER — Telehealth: Payer: Self-pay

## 2020-07-18 DIAGNOSIS — Z9181 History of falling: Secondary | ICD-10-CM | POA: Diagnosis not present

## 2020-07-18 DIAGNOSIS — D509 Iron deficiency anemia, unspecified: Secondary | ICD-10-CM | POA: Diagnosis not present

## 2020-07-18 DIAGNOSIS — I129 Hypertensive chronic kidney disease with stage 1 through stage 4 chronic kidney disease, or unspecified chronic kidney disease: Secondary | ICD-10-CM | POA: Diagnosis not present

## 2020-07-18 DIAGNOSIS — N189 Chronic kidney disease, unspecified: Secondary | ICD-10-CM | POA: Diagnosis not present

## 2020-07-18 DIAGNOSIS — Z85820 Personal history of malignant melanoma of skin: Secondary | ICD-10-CM | POA: Diagnosis not present

## 2020-07-18 DIAGNOSIS — I714 Abdominal aortic aneurysm, without rupture: Secondary | ICD-10-CM | POA: Diagnosis not present

## 2020-07-18 DIAGNOSIS — Z87891 Personal history of nicotine dependence: Secondary | ICD-10-CM | POA: Diagnosis not present

## 2020-07-18 DIAGNOSIS — K529 Noninfective gastroenteritis and colitis, unspecified: Secondary | ICD-10-CM | POA: Diagnosis not present

## 2020-07-18 DIAGNOSIS — I712 Thoracic aortic aneurysm, without rupture: Secondary | ICD-10-CM | POA: Diagnosis not present

## 2020-07-18 DIAGNOSIS — K219 Gastro-esophageal reflux disease without esophagitis: Secondary | ICD-10-CM | POA: Diagnosis not present

## 2020-07-18 DIAGNOSIS — I319 Disease of pericardium, unspecified: Secondary | ICD-10-CM | POA: Diagnosis not present

## 2020-07-18 DIAGNOSIS — I471 Supraventricular tachycardia: Secondary | ICD-10-CM | POA: Diagnosis not present

## 2020-07-18 DIAGNOSIS — Z28311 Partially vaccinated for covid-19: Secondary | ICD-10-CM | POA: Diagnosis not present

## 2020-07-18 DIAGNOSIS — M5030 Other cervical disc degeneration, unspecified cervical region: Secondary | ICD-10-CM | POA: Diagnosis not present

## 2020-07-18 DIAGNOSIS — M47812 Spondylosis without myelopathy or radiculopathy, cervical region: Secondary | ICD-10-CM | POA: Diagnosis not present

## 2020-07-18 DIAGNOSIS — H353190 Nonexudative age-related macular degeneration, unspecified eye, stage unspecified: Secondary | ICD-10-CM | POA: Diagnosis not present

## 2020-07-18 DIAGNOSIS — N3020 Other chronic cystitis without hematuria: Secondary | ICD-10-CM | POA: Diagnosis not present

## 2020-07-18 DIAGNOSIS — M5416 Radiculopathy, lumbar region: Secondary | ICD-10-CM | POA: Diagnosis not present

## 2020-07-18 DIAGNOSIS — D631 Anemia in chronic kidney disease: Secondary | ICD-10-CM | POA: Diagnosis not present

## 2020-07-18 DIAGNOSIS — M19031 Primary osteoarthritis, right wrist: Secondary | ICD-10-CM | POA: Diagnosis not present

## 2020-07-18 DIAGNOSIS — I351 Nonrheumatic aortic (valve) insufficiency: Secondary | ICD-10-CM | POA: Diagnosis not present

## 2020-07-18 DIAGNOSIS — S81801D Unspecified open wound, right lower leg, subsequent encounter: Secondary | ICD-10-CM | POA: Diagnosis not present

## 2020-07-18 NOTE — Telephone Encounter (Signed)
Scheduled appt per 5/6 sch msg. Called pt, no answer. Left msg with appt date and time.  

## 2020-07-18 NOTE — Telephone Encounter (Signed)
Returned call to pt to verify appointment.

## 2020-07-21 DIAGNOSIS — Z1159 Encounter for screening for other viral diseases: Secondary | ICD-10-CM | POA: Diagnosis not present

## 2020-07-21 DIAGNOSIS — Z20828 Contact with and (suspected) exposure to other viral communicable diseases: Secondary | ICD-10-CM | POA: Diagnosis not present

## 2020-07-22 DIAGNOSIS — N189 Chronic kidney disease, unspecified: Secondary | ICD-10-CM | POA: Diagnosis not present

## 2020-07-22 DIAGNOSIS — I351 Nonrheumatic aortic (valve) insufficiency: Secondary | ICD-10-CM | POA: Diagnosis not present

## 2020-07-22 DIAGNOSIS — I129 Hypertensive chronic kidney disease with stage 1 through stage 4 chronic kidney disease, or unspecified chronic kidney disease: Secondary | ICD-10-CM | POA: Diagnosis not present

## 2020-07-22 DIAGNOSIS — S81801D Unspecified open wound, right lower leg, subsequent encounter: Secondary | ICD-10-CM | POA: Diagnosis not present

## 2020-07-22 DIAGNOSIS — M5416 Radiculopathy, lumbar region: Secondary | ICD-10-CM | POA: Diagnosis not present

## 2020-07-22 DIAGNOSIS — D631 Anemia in chronic kidney disease: Secondary | ICD-10-CM | POA: Diagnosis not present

## 2020-07-23 DIAGNOSIS — M9901 Segmental and somatic dysfunction of cervical region: Secondary | ICD-10-CM | POA: Diagnosis not present

## 2020-07-23 DIAGNOSIS — M9902 Segmental and somatic dysfunction of thoracic region: Secondary | ICD-10-CM | POA: Diagnosis not present

## 2020-07-23 DIAGNOSIS — M4726 Other spondylosis with radiculopathy, lumbar region: Secondary | ICD-10-CM | POA: Diagnosis not present

## 2020-07-23 DIAGNOSIS — M9903 Segmental and somatic dysfunction of lumbar region: Secondary | ICD-10-CM | POA: Diagnosis not present

## 2020-07-28 DIAGNOSIS — Z1159 Encounter for screening for other viral diseases: Secondary | ICD-10-CM | POA: Diagnosis not present

## 2020-07-28 DIAGNOSIS — Z20828 Contact with and (suspected) exposure to other viral communicable diseases: Secondary | ICD-10-CM | POA: Diagnosis not present

## 2020-07-30 DIAGNOSIS — M9902 Segmental and somatic dysfunction of thoracic region: Secondary | ICD-10-CM | POA: Diagnosis not present

## 2020-07-30 DIAGNOSIS — I351 Nonrheumatic aortic (valve) insufficiency: Secondary | ICD-10-CM | POA: Diagnosis not present

## 2020-07-30 DIAGNOSIS — N189 Chronic kidney disease, unspecified: Secondary | ICD-10-CM | POA: Diagnosis not present

## 2020-07-30 DIAGNOSIS — S81801D Unspecified open wound, right lower leg, subsequent encounter: Secondary | ICD-10-CM | POA: Diagnosis not present

## 2020-07-30 DIAGNOSIS — M5416 Radiculopathy, lumbar region: Secondary | ICD-10-CM | POA: Diagnosis not present

## 2020-07-30 DIAGNOSIS — D631 Anemia in chronic kidney disease: Secondary | ICD-10-CM | POA: Diagnosis not present

## 2020-07-30 DIAGNOSIS — M9903 Segmental and somatic dysfunction of lumbar region: Secondary | ICD-10-CM | POA: Diagnosis not present

## 2020-07-30 DIAGNOSIS — I129 Hypertensive chronic kidney disease with stage 1 through stage 4 chronic kidney disease, or unspecified chronic kidney disease: Secondary | ICD-10-CM | POA: Diagnosis not present

## 2020-07-30 DIAGNOSIS — M9901 Segmental and somatic dysfunction of cervical region: Secondary | ICD-10-CM | POA: Diagnosis not present

## 2020-07-30 DIAGNOSIS — M4726 Other spondylosis with radiculopathy, lumbar region: Secondary | ICD-10-CM | POA: Diagnosis not present

## 2020-07-31 DIAGNOSIS — R350 Frequency of micturition: Secondary | ICD-10-CM | POA: Diagnosis not present

## 2020-07-31 DIAGNOSIS — R5382 Chronic fatigue, unspecified: Secondary | ICD-10-CM | POA: Diagnosis not present

## 2020-07-31 DIAGNOSIS — E871 Hypo-osmolality and hyponatremia: Secondary | ICD-10-CM | POA: Diagnosis not present

## 2020-08-03 NOTE — Progress Notes (Signed)
Cardiology Office Note:    Date:  08/04/2020   ID:  [name], DOB 07-Apr-1929, MRN [id number]  PCP:  Seward Carol, MD  Cardiologist:  Sinclair Grooms, MD   Referring MD: Seward Carol, MD   Chief Complaint  Patient presents with  . Thoracic Aortic Aneurysm  . Cardiac Valve Problem    History of Present Illness:    [name] is a 87 y.o. female with a hx of ascending aortic aneurysm (4.6 cm in diameter) for which she has refused subsequent f/u 2015, hypertension, mild aortic regurgitation, iron deficiency anemia, and history of paroxysmal SVT.  Doing well.  Reaffirmed the conversation with the patient concerning ascending aortic aneurysm treatment/surgery/emergency management.  Under no circumstance at her age she want to have any invasive therapy for abnormality related to her aorta.  I also further question her intention and desires concerning resuscitation.  She does not want to be resuscitated but does not have a DO NOT RESUSCITATE order.  She does have a living will.  I suggested that she speak with [facility] where she resides and be sure that they are aware of her desires.  She denies orthopnea, PND, and chest pain.  She has bilateral lower extremity 1-2+ ankle edema.  There is no significant dyspnea on exertion.  In the past when we have tried to use higher doses of carvedilol she will have episodes of near syncope.  She is currently on 6.25 mg a.m. and 12.5 mg p.m.  She also uses diltiazem 240 mg/day.  See blood pressures below.  Reviewed blood pressures that she takes at home.  Frequently above 155 to 470 mmHg systolic.  Past Medical History:  Diagnosis Date  . Anemia    CHRONIC  . Aortic insufficiency    a. 2D echo 08/29/15: EF 55-60%, mild LVH, diastolic dysfunction, elevated LV filling pressure, mild AI, severe LAE, mild RAE, mild TR, dilated descending thoracic aorta just distal to the takeoff of the left subclavian, measuring 4.1 cm, no pericardial  effusion.  . Arthritis    OA-SOME BACK AND NECK PAIN,  GOES TO CHIROPRACTOR TWICE A MONTH;  HX OF JOINT REPLACEMENTS   . Cancer ([place])    SKIN CANCERS REMOVED FROM LEGS  . CKD (chronic kidney disease), stage II   . Essential hypertension   . Gait abnormality 09/08/2017  . GERD (gastroesophageal reflux disease)   . PSVT (paroxysmal supraventricular tachycardia) ([place])   . Recurrent Pericarditis    RECURRENT   . Thoracic aneurysm    a. Followed by [facility] -last seen in 12/2013 w/ plan for f/u MRA and thoracic surgery f/u in 2 yrs, but patient does not wish to follow any longer.    Past Surgical History:  Procedure Laterality Date  . APPENDECTOMY    . BREAST EXCISIONAL BIOPSY Left 1992   benign  . EXCISION/RELEASE BURSA HIP Left 11/24/2012   Procedure: LEFT HIP BURSECTOMY AND TENDON REPAIR ;  Surgeon: Gearlean Alf, MD;  Location: [facility];  Service: Orthopedics;  Laterality: Left;  . HIP CLOSED REDUCTION Left 12/19/2012   Procedure: CLOSED MANIPULATION HIP;  Surgeon: Mauri Pole, MD;  Location: [facility];  Service: Orthopedics;  Laterality: Left;  . HIP CLOSED REDUCTION Left 12/05/2013   Procedure: CLOSED MANIPULATION HIP;  Surgeon: Augustin Schooling, MD;  Location: [facility];  Service: Orthopedics;  Laterality: Left;  . HIP CLOSED REDUCTION Left 09/22/2015   Procedure: CLOSED REDUCTION HIP;  Surgeon: Melina Schools, MD;  Location: [facility];  Service: Orthopedics;  Laterality: Left;  . JOINT REPLACEMENT     LEFT TOTAL HIP REPLACEMENT AND REVISIONS X 2  . OOPHORECTOMY     has a partial of one ovary remaingin  . PELVIC LAPAROSCOPY     ovarian cyst removal,   . REVISION TOTAL HIP ARTHROPLASTY     left  . right Achilles Tendon repair  5/13  . TOTAL KNEE ARTHROPLASTY     right  . TOTAL KNEE ARTHROPLASTY     left  . VAGINAL HYSTERECTOMY     with ovarian cyst removal    Current Medications: Current Meds  Medication Sig  . acetic acid-hydrocortisone (VOSOL-HC) OTIC solution   . amoxicillin  (AMOXIL) 500 MG capsule SMARTSIG:4 Capsule(s) By Mouth  . aspirin 81 MG tablet Take 81 mg by mouth daily.  . budesonide (ENTOCORT EC) 3 MG 24 hr capsule Take 9 mg by mouth daily.  . Calcium Carbonate-Vit D-Min (CALCIUM 1200 PO) Take 1 tablet by mouth daily.   . carboxymethylcellulose (REFRESH PLUS) 0.5 % SOLN Place 1 drop into both eyes daily.  . carvedilol (COREG) 12.5 MG tablet Take 12.5 mg by mouth. 1/2 in the Am; 1 in the PM  . cholecalciferol (VITAMIN D) 1000 UNITS tablet Take 1,000 Units by mouth daily.  . clobetasol cream (TEMOVATE) [id number] % Apply 1 application topically 2 (two) times daily as needed (itching).   Marland Kitchen diltiazem (CARDIZEM CD) 240 MG 24 hr capsule Take 1 capsule (240 mg total) by mouth daily.  Marland Kitchen estradiol (ESTRACE) 0.5 MG tablet Take 1 tablet (0.5 mg total) by mouth daily.  . ferrous sulfate 325 (65 FE) MG tablet Take 325 mg by mouth daily.  . meloxicam (MOBIC) 7.5 MG tablet Take 7.5 mg by mouth daily as needed for pain.   Marland Kitchen omega-3 acid ethyl esters (LOVAZA) 1 g capsule Take 1 g by mouth daily.   Marland Kitchen omeprazole (PRILOSEC) 40 MG capsule Take 40 mg by mouth daily.  Marland Kitchen oxybutynin (DITROPAN) 5 MG tablet Take by mouth.  . sodium chloride 1 g tablet Take 1 g by mouth daily.  Marland Kitchen tretinoin (RETIN-A) 0.1 % cream SMARTSIG:Sparingly Topical Every Night PRN     Allergies:   Cephalexin, Shellfish allergy, Shellfish-derived products, Clindamycin/lincomycin, Lincomycin, Other, Penicillamine, Sugar-protein-starch, Tape, Ciprofloxacin, Sulfa antibiotics, and Sulfites   Social History   Socioeconomic History  . Marital status: Widowed    Spouse name: Not on file  . Number of children: Not on file  . Years of education: Not on file  . Highest education level: Not on file  Occupational History  . Not on file  Tobacco Use  . Smoking status: Former Smoker    Types: Cigarettes  . Smokeless tobacco: Never Used  . Tobacco comment: quit age 62  Vaping Use  . Vaping Use: Never used   Substance and Sexual Activity  . Alcohol use: No    Alcohol/week: 0.0 standard drinks  . Drug use: No  . Sexual activity: Not Currently    Birth control/protection: Surgical    Comment: HYST-1st intercourse 87 yo-Fewer than 5 partners  Other Topics Concern  . Not on file  Social History Narrative  . Not on file   Social Determinants of Health   Financial Resource Strain: Not on file  Food Insecurity: Not on file  Transportation Needs: Not on file  Physical Activity: Not on file  Stress: Not on file  Social Connections: Not on file     Family  History: The patient's family history includes ALS in an other family member; Heart attack (age of onset: 65) in her father; Hypertension in her mother; Stroke in her mother. There is no history of Breast cancer.  ROS:   Please see the history of present illness.    Says she is doing well.  States that she is ready to be with her husband only who is deceased.  He was also a patient.  She is not against trying to have better blood pressure control if we can do it without causing her to feel sick.  All other systems reviewed and are negative.  EKGs/Labs/Other Studies Reviewed:    The following studies were reviewed today:  CTA CHEST 2009: IMPRESSION:    1. No CT findings for pulmonary embolism.  2. Fusiform aneurysmal dilatation of the ascending aorta as  discussed above.  3. Hiatal hernia and mild distal esophageal wall thickening.  4. No acute pulmonary findings. There are mild chronic lung  changes and areas of dependent subpleural atelectasis.   There is fusiform aneurysmal  dilatation of the ascending aorta with maximal measurements of 4.7  x 4.5 cm. This measured with 4.4 x 4.1 cm on the prior CT scan.  No dissection. The descending thoracic aorta demonstrates  atherosclerotic calcifications.  EKG:  EKG normal sinus rhythm, atrial abnormality, otherwise normal.  When compared to 05/22/2019, is noted.  Recent  Labs: 05/21/2020: ALT 22; BUN 36; Creatinine 1.11; Hemoglobin 10.4; Platelet Count 225; Potassium 4.7; Sodium 134  Recent Lipid Panel No results found for: CHOL, TRIG, HDL, CHOLHDL, VLDL, LDLCALC, LDLDIRECT  Physical Exam:    VS:  BP (!) 182/80   Pulse 62   Ht 5\' 4"  (1.626 m)   Wt 153 lb 12.8 oz (69.8 kg)   SpO2 98%   BMI 26.40 kg/m     Wt Readings from Last 3 Encounters:  08/04/20 153 lb 12.8 oz (69.8 kg)  06/20/20 148 lb (67.1 kg)  05/21/20 154 lb 9.6 oz (70.1 kg)     GEN: Elderly, slender.. No acute distress HEENT: Normal NECK: No JVD. LYMPHATICS: No lymphadenopathy CARDIAC: 2/6 to 3/6 decrescendo murmur of aortic regurgitation and there is a 1/6 to 2/6 systolic murmur. RRR no gallop, with bilateral ankle 2+ edema. VASCULAR:  Normal Pulses. No bruits. RESPIRATORY:  Clear to auscultation without rales, wheezing or rhonchi  ABDOMEN: Soft, non-tender, non-distended, No pulsatile mass, MUSCULOSKELETAL: No deformity  SKIN: Warm and dry NEUROLOGIC:  Alert and oriented x 3 PSYCHIATRIC:  Normal affect   ASSESSMENT:    1. Nonrheumatic aortic valve insufficiency   2. Thoracic aortic aneurysm without rupture ([place])   3. Essential hypertension    PLAN:    In order of problems listed above:  1. At least moderate aortic regurgitation.  She would not accept any particular management strategy for aortic regurgitation other than medical therapy.  Hence, I have not recommended an echocardiogram and any form of imaging. 2. She refuses to have imaging of her aorta since 2015.  Says she will be happy just to take what ever outcome occurs naturally. 3. Systolic is too high with her known underlying clinical condition.  Start HCTZ 12.5 mg Monday, Wednesday, and Friday.  Measure blood pressure 2-3 times each week.  Attempting to control systolic pressures under 329 mmHg.  She has a very widened pulse pressure due to aortic regurgitation.  Encouraged supplementing her diet with green leafy  vegetables and fruit as sources of potassium.  Call in  blood pressures over the next 2 to 3 weeks after starting HCTZ.  She will have a comprehensive metabolic panel performed in September at the hematology clinic.  She will also have 1 performed tomorrow.   Medication Adjustments/Labs and Tests Ordered: Current medicines are reviewed at length with the patient today.  Concerns regarding medicines are outlined above.  Orders Placed This Encounter  Procedures  . EKG 12-Lead   No orders of the defined types were placed in this encounter.   There are no Patient Instructions on file for this visit.   Signed, Sinclair Grooms, MD  08/04/2020 1:28 PM    [facility]

## 2020-08-04 ENCOUNTER — Other Ambulatory Visit: Payer: Self-pay

## 2020-08-04 ENCOUNTER — Ambulatory Visit (INDEPENDENT_AMBULATORY_CARE_PROVIDER_SITE_OTHER): Payer: Medicare Other | Admitting: Interventional Cardiology

## 2020-08-04 ENCOUNTER — Encounter: Payer: Self-pay | Admitting: Interventional Cardiology

## 2020-08-04 ENCOUNTER — Other Ambulatory Visit: Payer: Self-pay | Admitting: *Deleted

## 2020-08-04 ENCOUNTER — Encounter (INDEPENDENT_AMBULATORY_CARE_PROVIDER_SITE_OTHER): Payer: Self-pay

## 2020-08-04 VITALS — BP 182/80 | HR 62 | Ht 64.0 in | Wt 153.8 lb

## 2020-08-04 DIAGNOSIS — I351 Nonrheumatic aortic (valve) insufficiency: Secondary | ICD-10-CM | POA: Diagnosis not present

## 2020-08-04 DIAGNOSIS — I712 Thoracic aortic aneurysm, without rupture, unspecified: Secondary | ICD-10-CM

## 2020-08-04 DIAGNOSIS — Z1159 Encounter for screening for other viral diseases: Secondary | ICD-10-CM | POA: Diagnosis not present

## 2020-08-04 DIAGNOSIS — I10 Essential (primary) hypertension: Secondary | ICD-10-CM | POA: Diagnosis not present

## 2020-08-04 DIAGNOSIS — Z20828 Contact with and (suspected) exposure to other viral communicable diseases: Secondary | ICD-10-CM | POA: Diagnosis not present

## 2020-08-04 MED ORDER — HYDROCHLOROTHIAZIDE 12.5 MG PO CAPS: 12.5000 mg | ORAL_CAPSULE | ORAL | 3 refills | Status: DC

## 2020-08-04 NOTE — Patient Instructions (Signed)
Medication Instructions:  1) START Hydrochlorothiazide 12.5mg  once daily on Monday, Wednesday and Friday.  Monitor your blood pressure for 2-3 weeks and contact our office with those readings.  *If you need a refill on your cardiac medications before your next appointment, please call your pharmacy*   Lab Work: None If you have labs (blood work) drawn today and your tests are completely normal, you will receive your results only by: Marland Kitchen MyChart Message (if you have MyChart) OR . A paper copy in the mail If you have any lab test that is abnormal or we need to change your treatment, we will call you to review the results.   Testing/Procedures: None   Follow-Up: At [facility], you and your health needs are our priority.  As part of our continuing mission to provide you with exceptional heart care, we have created designated Provider Care Teams.  These Care Teams include your primary Cardiologist (physician) and Advanced Practice Providers (APPs -  Physician Assistants and Nurse Practitioners) who all work together to provide you with the care you need, when you need it.  We recommend signing up for the patient portal called "MyChart".  Sign up information is provided on this After Visit Summary.  MyChart is used to connect with patients for Virtual Visits (Telemedicine).  Patients are able to view lab/test results, encounter notes, upcoming appointments, etc.  Non-urgent messages can be sent to your provider as well.   To learn more about what you can do with MyChart, go to [email or web address].    Your next appointment:   1 year(s)  The format for your next appointment:   In Person  Provider:   You may see Sinclair Grooms, MD or one of the following Advanced Practice Providers on your designated Care Team:    Kathyrn Drown, NP    Other Instructions   Potassium Content of Foods  Potassium is a mineral found in many foods and drinks. It affects how the heart works, and  helps keep fluids and minerals balanced in the body. The amount of potassium you need each day depends on your age and any medical conditions you may have. Talk to your health care provider or dietitian about how much potassium you need. The following lists of foods provide the general serving size for foods and the approximate amount of potassium in each serving, listed in milligrams (mg). Actual values may vary depending on the product and how it is processed. High in potassium The following foods and beverages have 200 mg or more of potassium per serving:  Apricots (raw) - 2 have 200 mg of potassium.  Apricots (dry) - 5 have 200 mg of potassium.  Artichoke - 1 medium has 345 mg of potassium.  Avocado -  fruit has 245 mg of potassium.  Banana - 1 medium fruit has 425 mg of potassium.  [place] or baked beans (canned) -  cup has 280 mg of potassium.  White beans (canned) -  cup has 595 mg potassium.  Beef roast - 3 oz has 320 mg of potassium.  Ground beef - 3 oz has 270 mg of potassium.  Beets (raw or cooked) -  cup has 260 mg of potassium.  Bran muffin - 2 oz has 300 mg of potassium.  Broccoli (cooked) -  cup has 230 mg of potassium.  Brussels sprouts -  cup has 250 mg of potassium.  Cantaloupe -  cup has 215 mg of potassium.  Cereal, 100% bran -  cup has 200-400 mg of potassium.  Cheeseburger -1 single fast food burger has 225-400 mg of potassium.  Chicken - 3 oz has 220 mg of potassium.  Clams (canned) - 3 oz has 535 mg of potassium.  Crab - 3 oz has 225 mg of potassium.  Dates - 5 have 270 mg of potassium.  Dried beans and peas -  cup has 300-475 mg of potassium.  Figs (dried) - 2 have 260 mg of potassium.  Fish (halibut, tuna, cod, snapper) - 3 oz has 480 mg of potassium.  Fish (salmon, haddock, swordfish, perch) - 3 oz has 300 mg of potassium.  Fish (tuna, canned) - 3 oz has 200 mg of potassium.  [place] fries (fast food) - 3 oz has 470 mg of  potassium.  Granola with fruit and nuts -  cup has 200 mg of potassium.  Grapefruit juice -  cup has 200 mg of potassium.  Honeydew melon -  cup has 200 mg of potassium.  Kale (raw) - 1 cup has 300 mg of potassium.  Kiwi - 1 medium fruit has 240 mg of potassium.  Kohlrabi, rutabaga, parsnips -  cup has 280 mg of potassium.  Lentils -  cup has 365 mg of potassium.  Mango - 1 each has 325 mg of potassium.  Milk (nonfat, low-fat, whole, buttermilk) - 1 cup has 350-380 mg of potassium.  Milk (chocolate) - 1 cup has 420 mg of potassium  Molasses - 1 Tbsp has 295 mg of potassium.  Mushrooms -  cup has 280 mg of potassium.  Nectarine - 1 each has 275 mg of potassium.  Nuts (almonds, peanuts, hazelnuts, [place], cashew, mixed) - 1 oz has 200 mg of potassium.  Nuts (pistachios) - 1 oz has 295 mg of potassium.  Orange - 1 fruit has 240 mg of potassium.  Orange juice -  cup has 235 mg of potassium.  Papaya -  medium fruit has 390 mg of potassium.  Peanut butter (chunky) - 2 Tbsp has 240 mg of potassium.  Peanut butter (smooth) - 2 Tbsp has 210 mg of potassium.  Pear - 1 medium (200 mg) of potassium.  Pomegranate - 1 whole fruit has 400 mg of potassium.  Pomegranate juice -  cup has 215 mg of potassium.  Pork - 3 oz has 350 mg of potassium.  Potato chips (salted) - 1 oz has 465 mg of potassium.  Potato (baked with skin) - 1 medium has 925 mg of potassium.  Potato (boiled) -  cup has 255 mg of potassium.  Potato (Mashed) -  cup has 330 mg of potassium.  Prune juice -  cup has 370 mg of potassium.  Prunes - 5 have 305 mg of potassium.  Pudding (chocolate) -  cup has 230 mg of potassium.  Pumpkin (canned) -  cup has 250 mg of potassium.  Raisins (seedless) -  cup has 270 mg of potassium.  Seeds (sunflower or pumpkin) - 1 oz has 240 mg of potassium.  Soy milk - 1 cup has 300 mg of potassium.  Spinach (cooked) - 1/2 cup has 420 mg of  potassium.  Spinach (canned) -  cup has 370 mg of potassium.  Sweet potato (baked with skin) - 1 medium has 450 mg of potassium.  Swiss chard -  cup has 480 mg of potassium.  Tomato or vegetable juice -  cup has 275 mg of potassium.  Tomato (sauce or puree) -  cup has 400-550 mg  of potassium.  Tomato (raw) - 1 medium has 290 mg of potassium.  Tomato (canned) -  cup has 200-300 mg of potassium.  [place] - 3 oz has 250 mg of potassium.  Wheat germ - 1 oz has 250 mg of potassium.  Winter squash -  cup has 250 mg of potassium.  Yogurt (plain or fruited) - 6 oz has 260-435 mg of potassium.  Zucchini -  cup has 220 mg of potassium. Moderate in potassium The following foods and beverages have 50-200 mg of potassium per serving:  Apple - 1 fruit has 150 mg of potassium  Apple juice -  cup has 150 mg of potassium  Applesauce -  cup has 90 mg of potassium  Apricot nectar -  cup has 140 mg of potassium  Asparagus (small spears) -  cup has 155 mg of potassium  Asparagus (large spears) - 6 have 155 mg of potassium  Bagel (cinnamon raisin) - 1 four-inch bagel has 130 mg of potassium  Bagel (egg or plain) - 1 four- inch bagel has 70 mg of potassium  Beans (green) -  cup has 90 mg of potassium  Beans (yellow) -  cup has 190 mg of potassium  Beer, regular - 12 oz has 100 mg of potassium  Beets (canned) -  cup has 125 mg of potassium  Blackberries -  cup has 115 mg of potassium  Blueberries -  cup has 60 mg of potassium  Bread (whole wheat) - 1 slice has 70 mg of potassium  Broccoli (raw) -  cup has 145 mg of potassium  Cabbage -  cup has 150 mg of potassium  Carrots (cooked or raw) -  cup has 180 mg of potassium  Cauliflower (raw) -  cup has 150 mg of potassium  Celery (raw) -  cup has 155 mg of potassium  Cereal, bran flakes -  cup has 120-150 mg of potassium  Cheese (cottage) -  cup has 110 mg of potassium  Cherries - 10 have 150 mg of  potassium  Chocolate - 1 oz bar has 165 mg of potassium  Coffee (brewed) - 6 oz has 90 mg of potassium  Corn -  cup or 1 ear has 195 mg of potassium  Cucumbers -  cup has 80 mg of potassium  Egg - 1 large egg has 60 mg of potassium  Eggplant -  cup has 60 mg of potassium  Endive (raw) -  cup has 80 mg of potassium  English muffin - 1 has 65 mg of potassium  Fish (ocean perch) - 3 oz has 192 mg of potassium  Frankfurter, beef or pork - 1 has 75 mg of potassium  Fruit cocktail -  cup has 115 mg of potassium  Grape juice -  cup has 170 mg of potassium  Grapefruit -  fruit has 175 mg of potassium  Grapes -  cup has 155 mg of potassium  Greens: kale, turnip, collard -  cup has 110-150 mg of potassium  Ice cream or frozen yogurt (chocolate) -  cup has 175 mg of potassium  Ice cream or frozen yogurt (vanilla) -  cup has 120-150 mg of potassium  Lemons, limes - 1 each has 80 mg of potassium  Lettuce - 1 cup has 100 mg of potassium  Mixed vegetables -  cup has 150 mg of potassium  Mushrooms, raw -  cup has 110 mg of potassium  Nuts (walnuts, pecans, or macadamia) - 1 oz  has 125 mg of potassium  Oatmeal -  cup has 80 mg of potassium  Okra -  cup has 110 mg of potassium  Onions -  cup has 120 mg of potassium  Peach - 1 has 185 mg of potassium  Peaches (canned) -  cup has 120 mg of potassium  Pears (canned) -  cup has 120 mg of potassium  Peas, green (frozen) -  cup has 90 mg of potassium  Peppers (Green) -  cup has 130 mg of potassium  Peppers (Red) -  cup has 160 mg of potassium  Pineapple juice -  cup has 165 mg of potassium  Pineapple (fresh or canned) -  cup has 100 mg of potassium  Plums - 1 has 105 mg of potassium  Pudding, vanilla -  cup has 150 mg of potassium  Raspberries -  cup has 90 mg of potassium  Rhubarb -  cup has 115 mg of potassium  Rice, wild -  cup has 80 mg of potassium  Shrimp - 3 oz has 155 mg of  potassium  Spinach (raw) - 1 cup has 170 mg of potassium  Strawberries -  cup has 125 mg of potassium  Summer squash -  cup has 175-200 mg of potassium  Swiss chard (raw) - 1 cup has 135 mg of potassium  Tangerines - 1 fruit has 140 mg of potassium  Tea, brewed - 6 oz has 65 mg of potassium  Turnips -  cup has 140 mg of potassium  Watermelon -  cup has 85 mg of potassium  Wine (Red, table) - 5 oz has 180 mg of potassium  Wine (White, table) - 5 oz 100 mg of potassium Low in potassium The following foods and beverages have less than 50 mg of potassium per serving.  Bread (white) - 1 slice has 30 mg of potassium  Carbonated beverages - 12 oz has less than 5 mg of potassium  Cheese - 1 oz has 20-30 mg of potassium  Cranberries -  cup has 45 mg of potassium  Cranberry juice cocktail -  cup has 20 mg of potassium  Fats and oils - 1 Tbsp has less than 5 mg of potassium  Hummus - 1 Tbsp has 32 mg of potassium  Nectar (papaya, mango, or pear) -  cup has 35 mg of potassium  Rice (white or brown) -  cup has 50 mg of potassium  Spaghetti or macaroni (cooked) -  cup has 30 mg of potassium  Tortilla, flour or corn - 1 has 50 mg of potassium  Waffle - 1 four-inch waffle has 50 mg of potassium  Water chestnuts -  cup has 40 mg of potassium Summary  Potassium is a mineral found in many foods and drinks. It affects how the heart works, and helps keep fluids and minerals balanced in the body.  The amount of potassium you need each day depends on your age and any existing medical conditions you may have. Your health care provider or dietitian may recommend an amount of potassium that you should have each day. This information is not intended to replace advice given to you by your health care provider. Make sure you discuss any questions you have with your health care provider. Document Revised: 02/11/2017 Document Reviewed: 05/26/2016 Elsevier Patient Education  [place].

## 2020-08-04 NOTE — Progress Notes (Signed)
HEMATOLOGY/ONCOLOGY CLINIC NOTE  Date of Service: 08/05/2020  Patient Care Team: Seward Carol, MD as PCP - General (Internal Medicine) Belva Crome, MD as PCP - Cardiology (Cardiology)  CHIEF COMPLAINTS/PURPOSE OF CONSULTATION:  Anemia  HISTORY OF PRESENTING ILLNESS:   [name] is a wonderful 87 y.o. female who has been referred to [place] by Dr. Delfina Redwood for evaluation and management of anemia. The pt reports that she is doing well overall.   The pt reports noticable fatigue. Pt has known of her anemia and iron deficiency for years and was previously placed on PO iron but could not tolerate it. She has previously tried Ferrous Sulfate which caused diarrhea.  A month ago the pt suddenly became unable to move her fingers, the next day her fingers were swollen. The day following the swelling her fingers returned to normal and regained their regular function. She has not been diagnosed with Rheumatoid Arthritis to her knowledge.  Three months ago the pt was having diarrhea for which she was given Betamethasone by GI. Pt is not sure what the diagnosis was. She was seen by Dr. Ronnette Juniper and is scheduled to follow up at the beginning of January. Her diarrhea is currently well-controlled with the steroids.   Pt is using Meloxicam as needed. She is on HRT, managed by Dr. Delilah Shan. Pt had a total hysterectomy at 30 due to significant pelvic pain.   She has previously had hyponatremia. She received a head CT in 2019 due to concussion and disturbed gait. Dr. Jannifer Franklin, Neurology, did not have concerns for low pressure hydrocephalus.   Pt has had both knees and her left hip replaced. She previously had regular Colonoscopies but discontinued due to numerous bowel adhesions.  Most recent lab results (02/26/2020) of CBC is as follows: all values are WNL except for RBC at 3.45, Hgb at 9.7, HCT at 30.0, Baso Rel at 1.1, Mono Abs at 0.9K. 01/16/2020 RF at 57.5 11/23/2019 Ferritin at 25.3  On  review of systems, pt reports fatigue and denies nose bleeds, gum bleeds, hematuria, bloody/black stools, new bone pain, new joint pain, unexpected weight loss, low appetite, diarrhea and any other symptoms.   On PMHx the pt reports Anemia, Arthritis, CKD stage II, Left Total Hip Replacement, Oophorectomy, Hysterectomy, B/L Total Knee Arthroplasty.  INTERVAL HISTORY:  [name] is a wonderful 87 y.o. female who is here for evaluation and management of anemia. The patient's last visit with [place] was on 05/21/2020. The pt reports that she is doing well overall.  The pt reports that she is constantly fatigued and not feeling well due to this. She had a hematoma on her leg due to tripping over a chair. This has since resolved and is better. She notes she is taking Ferrous Sulfate once daily and continues to take the B-Complex. She notes some desire in taking a Beef liver pill she recently got.  Lab results today 08/05/2020 of CBC w/diff and CMP is as follows: all values are WNL except for RBC of 3.28, Hgb of 9.7, HCT of 30.6, Basophils Abs of 0.2K, Glucose of 111, BUN of 35, Creatinine of 1.12, Albumin of 3.4, GFR est of 47. 08/05/2020 Iron pending. 08/05/2020 Ferritin pending.  On review of systems, pt reports fatigue, recent hematoma of leg and denies infection issues, abdominal pain, back pain, and any other symptoms.  MEDICAL HISTORY:  Past Medical History:  Diagnosis Date  . Anemia    CHRONIC  . Aortic insufficiency  a. 2D echo 08/29/15: EF 55-60%, mild LVH, diastolic dysfunction, elevated LV filling pressure, mild AI, severe LAE, mild RAE, mild TR, dilated descending thoracic aorta just distal to the takeoff of the left subclavian, measuring 4.1 cm, no pericardial effusion.  . Arthritis    OA-SOME BACK AND NECK PAIN,  GOES TO CHIROPRACTOR TWICE A MONTH;  HX OF JOINT REPLACEMENTS   . Cancer ([place])    SKIN CANCERS REMOVED FROM LEGS  . CKD (chronic kidney disease), stage II   .  Essential hypertension   . Gait abnormality 09/08/2017  . GERD (gastroesophageal reflux disease)   . PSVT (paroxysmal supraventricular tachycardia) ([place])   . Recurrent Pericarditis    RECURRENT   . Thoracic aneurysm    a. Followed by [facility] -last seen in 12/2013 w/ plan for f/u MRA and thoracic surgery f/u in 2 yrs, but patient does not wish to follow any longer.    SURGICAL HISTORY: Past Surgical History:  Procedure Laterality Date  . APPENDECTOMY    . BREAST EXCISIONAL BIOPSY Left 1992   benign  . EXCISION/RELEASE BURSA HIP Left 11/24/2012   Procedure: LEFT HIP BURSECTOMY AND TENDON REPAIR ;  Surgeon: Gearlean Alf, MD;  Location: [facility];  Service: Orthopedics;  Laterality: Left;  . HIP CLOSED REDUCTION Left 12/19/2012   Procedure: CLOSED MANIPULATION HIP;  Surgeon: Mauri Pole, MD;  Location: [facility];  Service: Orthopedics;  Laterality: Left;  . HIP CLOSED REDUCTION Left 12/05/2013   Procedure: CLOSED MANIPULATION HIP;  Surgeon: Augustin Schooling, MD;  Location: [facility];  Service: Orthopedics;  Laterality: Left;  . HIP CLOSED REDUCTION Left 09/22/2015   Procedure: CLOSED REDUCTION HIP;  Surgeon: Melina Schools, MD;  Location: [facility];  Service: Orthopedics;  Laterality: Left;  . JOINT REPLACEMENT     LEFT TOTAL HIP REPLACEMENT AND REVISIONS X 2  . OOPHORECTOMY     has a partial of one ovary remaingin  . PELVIC LAPAROSCOPY     ovarian cyst removal,   . REVISION TOTAL HIP ARTHROPLASTY     left  . right Achilles Tendon repair  5/13  . TOTAL KNEE ARTHROPLASTY     right  . TOTAL KNEE ARTHROPLASTY     left  . VAGINAL HYSTERECTOMY     with ovarian cyst removal    SOCIAL HISTORY: Social History   Socioeconomic History  . Marital status: Widowed    Spouse name: Not on file  . Number of children: Not on file  . Years of education: Not on file  . Highest education level: Not on file  Occupational History  . Not on file  Tobacco Use  . Smoking status: Former Smoker    Types:  Cigarettes  . Smokeless tobacco: Never Used  . Tobacco comment: quit age 70  Vaping Use  . Vaping Use: Never used  Substance and Sexual Activity  . Alcohol use: No    Alcohol/week: 0.0 standard drinks  . Drug use: No  . Sexual activity: Not Currently    Birth control/protection: Surgical    Comment: HYST-1st intercourse 87 yo-Fewer than 5 partners  Other Topics Concern  . Not on file  Social History Narrative  . Not on file   Social Determinants of Health   Financial Resource Strain: Not on file  Food Insecurity: Not on file  Transportation Needs: Not on file  Physical Activity: Not on file  Stress: Not on file  Social Connections: Not on file  Intimate Partner Violence: Not  on file    FAMILY HISTORY: Family History  Problem Relation Age of Onset  . Hypertension Mother   . Stroke Mother   . Heart attack Father 18  . ALS Other   . Breast cancer Neg Hx     ALLERGIES:  is allergic to cephalexin, shellfish allergy, shellfish-derived products, clindamycin/lincomycin, lincomycin, other, penicillamine, sugar-protein-starch, tape, ciprofloxacin, sulfa antibiotics, and sulfites.  MEDICATIONS:  Current Outpatient Medications  Medication Sig Dispense Refill  . acetic acid-hydrocortisone (VOSOL-HC) OTIC solution     . amoxicillin (AMOXIL) 500 MG capsule SMARTSIG:4 Capsule(s) By Mouth    . aspirin 81 MG tablet Take 81 mg by mouth daily.    . budesonide (ENTOCORT EC) 3 MG 24 hr capsule Take 9 mg by mouth daily.    . Calcium Carbonate-Vit D-Min (CALCIUM 1200 PO) Take 1 tablet by mouth daily.     . carboxymethylcellulose (REFRESH PLUS) 0.5 % SOLN Place 1 drop into both eyes daily.    . carvedilol (COREG) 12.5 MG tablet Take 12.5 mg by mouth. 1/2 in the Am; 1 in the PM    . cholecalciferol (VITAMIN D) 1000 UNITS tablet Take 1,000 Units by mouth daily.    . clobetasol cream (TEMOVATE) [id number] % Apply 1 application topically 2 (two) times daily as needed (itching).     Marland Kitchen diltiazem  (CARDIZEM CD) 240 MG 24 hr capsule Take 1 capsule (240 mg total) by mouth daily. 90 capsule 3  . estradiol (ESTRACE) 0.5 MG tablet Take 1 tablet (0.5 mg total) by mouth daily. 90 tablet 2  . ferrous sulfate 325 (65 FE) MG tablet Take 325 mg by mouth daily.    . hydrochlorothiazide (MICROZIDE) 12.5 MG capsule Take 1 capsule (12.5 mg total) by mouth every Monday, Wednesday, and Friday. 45 capsule 3  . meloxicam (MOBIC) 7.5 MG tablet Take 7.5 mg by mouth daily as needed for pain.     Marland Kitchen omega-3 acid ethyl esters (LOVAZA) 1 g capsule Take 1 g by mouth daily.     Marland Kitchen omeprazole (PRILOSEC) 40 MG capsule Take 40 mg by mouth daily.    Marland Kitchen oxybutynin (DITROPAN) 5 MG tablet Take by mouth.    . sodium chloride 1 g tablet Take 1 g by mouth daily.    Marland Kitchen tretinoin (RETIN-A) 0.1 % cream SMARTSIG:Sparingly Topical Every Night PRN     No current facility-administered medications for this visit.    REVIEW OF SYSTEMS:   10 Point review of Systems was done is negative except as noted above.  PHYSICAL EXAMINATION: ECOG PERFORMANCE STATUS: 0 - Asymptomatic  .BP (!) 145/69 (BP Location: Left Arm, Patient Position: Sitting) Comment: nurse notified  Pulse 68   Temp 97.7 F (36.5 C) (Tympanic)   Resp 16   Ht 5\' 4"  (1.626 m)   Wt 153 lb 9.6 oz (69.7 kg)   SpO2 97%   BMI 26.37 kg/m   Exam was given in a chair.   GENERAL:alert, in no acute distress and comfortable SKIN: no acute rashes, no significant lesions EYES: conjunctiva are pink and non-injected, sclera anicteric OROPHARYNX: MMM, no exudates, no oropharyngeal erythema or ulceration NECK: supple, no JVD LYMPH:  no palpable lymphadenopathy in the cervical, axillary or inguinal regions LUNGS: clear to auscultation b/l with normal respiratory effort HEART: regular rate & rhythm ABDOMEN:  normoactive bowel sounds , non tender, not distended. Extremity: 1+ pedal edema PSYCH: alert & oriented x 3 with fluent speech NEURO: no focal motor/sensory  deficits  LABORATORY DATA:  I have reviewed the data as listed  . CBC Latest Ref Rng & Units 08/05/2020 05/21/2020 05/21/2020  WBC 4.0 - 10.5 K/uL 10.1 8.9 -  Hemoglobin 12.0 - 15.0 g/dL 9.7(L) 10.4(L) -  Hematocrit 36.0 - 46.0 % 30.6(L) 32.1(L) 33.0(L)  Platelets 150 - 400 K/uL 273 225 -   . CBC    Component Value Date/Time   WBC 10.1 08/05/2020 0943   WBC 9.5 03/06/2020 1535   RBC 3.28 (L) 08/05/2020 0943   HGB 9.7 (L) 08/05/2020 0943   HGB 10.8 (L) 12/26/2007 1539   HCT 30.6 (L) 08/05/2020 0943   HCT 33.0 (L) 05/21/2020 1446   HCT 33.4 (L) 12/26/2007 1539   PLT 273 08/05/2020 0943   PLT 314 12/26/2007 1539   MCV 93.3 08/05/2020 0943   MCV 88.5 12/26/2007 1539   MCH 29.6 08/05/2020 0943   MCHC 31.7 08/05/2020 0943   RDW 13.4 08/05/2020 0943   RDW 15.3 (H) 12/26/2007 1539   LYMPHSABS 2.5 08/05/2020 0943   LYMPHSABS 2.2 12/26/2007 1539   MONOABS 0.9 08/05/2020 0943   MONOABS 0.8 12/26/2007 1539   EOSABS 0.4 08/05/2020 0943   EOSABS 0.6 (H) 12/26/2007 1539   BASOSABS 0.2 (H) 08/05/2020 0943   BASOSABS 0.1 12/26/2007 1539     . CMP Latest Ref Rng & Units 08/05/2020 05/21/2020 03/06/2020  Glucose 70 - 99 mg/dL 111(H) 99 97  BUN 8 - 23 mg/dL 35(H) 36(H) 36(H)  Creatinine 0.44 - 1.00 mg/dL 1.12(H) 1.11(H) 1.34(H)  Sodium 135 - 145 mmol/L 135 134(L) 139  Potassium 3.5 - 5.1 mmol/L 4.7 4.7 4.6  Chloride 98 - 111 mmol/L 103 103 106  CO2 22 - 32 mmol/L 23 24 28   Calcium 8.9 - 10.3 mg/dL 9.3 8.9 9.9  Total Protein 6.5 - 8.1 g/dL 7.5 7.5 8.8(H)  Total Bilirubin 0.3 - 1.2 mg/dL 0.3 <0.2(L) 0.2(L)  Alkaline Phos 38 - 126 U/L 60 56 61  AST 15 - 41 U/L 23 24 31   ALT 0 - 44 U/L 17 22 22    Component     Latest Ref Rng & Units 03/06/2020  Iron     41 - 142 ug/dL 35 (L)  TIBC     236 - 444 ug/dL 448 (H)  Saturation Ratios     21 - 57 % 8 (L)  UIBC     120 - 384 ug/dL 413 (H)  Folate, Hemolysate     Not Estab. ng/mL 605.0  HCT     34.0 - 46.6 % 29.6 (L)  Folate, RBC      >498 ng/mL 2,044  Erythropoietin     2.6 - 18.5 mIU/mL 14.9  RA Latex Turbid.     <14.0 IU/mL 99.5 (H)  Vitamin B12     180 - 914 pg/mL 841  Ferritin     11 - 307 ng/mL 35  Sed Rate     0 - 22 mm/hr 22   . Lab Results  Component Value Date   IRON 62 05/21/2020   TIBC 288 05/21/2020   IRONPCTSAT 21 05/21/2020   (Iron and TIBC)  Lab Results  Component Value Date   FERRITIN 579 (H) 05/21/2020     RADIOGRAPHIC STUDIES: I have personally reviewed the radiological images as listed and agreed with the findings in the report. No results found.  ASSESSMENT & PLAN:   87 yo with   1) Chronic normocytic anemia Due to CKD + iron deficiency + anemia of chronic inflammation from  possible RA   PLAN: -Discussed pt labwork today, 08/05/2020; blood counts and chemistries stable. -Advised pt that she could take the beef liver pill if desires, but this is not FDA regulated nor do we encourage it. -Advised pt that she does not need any transfusions at this time with a Hgb of 9.5. -Discussed consolidated of care and alternating visits with PCP. The pt is agreeable to this. -Continue f/u w PCP regarding management of RA. -Will continue to f/u on iron levels for any infusion needs.-- ferritin 437 --- no indication for additional IV Iron at this time. -Will see back in 12 months with labs. Pt will have a visit and labs w PCP in 6 months.   FOLLOW UP: RTC with Dr Irene Limbo with labs in 12 months     The total time spent in the appointment was 20 minutes and more than 50% was on counseling and direct patient cares.   All of the patient's questions were answered with apparent satisfaction. The patient knows to call the clinic with any problems, questions or concerns.    Sullivan Lone MD [place] AAHIVMS [facility] Hematology/Oncology Physician [facility]  (Office):       [phone] (Work cell):  [phone] (Fax):           [phone]  08/05/2020 10:58 AM   I, Reinaldo Raddle,  am acting as scribe for Dr. Sullivan Lone, MD.   .I have reviewed the above documentation for accuracy and completeness, and I agree with the above. Brunetta Genera MD

## 2020-08-05 ENCOUNTER — Inpatient Hospital Stay (HOSPITAL_BASED_OUTPATIENT_CLINIC_OR_DEPARTMENT_OTHER): Payer: Medicare Other | Admitting: Hematology

## 2020-08-05 ENCOUNTER — Inpatient Hospital Stay: Payer: Medicare Other | Attending: Hematology

## 2020-08-05 ENCOUNTER — Other Ambulatory Visit: Payer: Self-pay | Admitting: *Deleted

## 2020-08-05 VITALS — BP 145/69 | HR 68 | Temp 97.7°F | Resp 16 | Ht 64.0 in | Wt 153.6 lb

## 2020-08-05 DIAGNOSIS — E538 Deficiency of other specified B group vitamins: Secondary | ICD-10-CM | POA: Diagnosis not present

## 2020-08-05 DIAGNOSIS — Z87891 Personal history of nicotine dependence: Secondary | ICD-10-CM | POA: Diagnosis not present

## 2020-08-05 DIAGNOSIS — D509 Iron deficiency anemia, unspecified: Secondary | ICD-10-CM

## 2020-08-05 DIAGNOSIS — Z79890 Hormone replacement therapy (postmenopausal): Secondary | ICD-10-CM | POA: Diagnosis not present

## 2020-08-05 DIAGNOSIS — E611 Iron deficiency: Secondary | ICD-10-CM | POA: Diagnosis not present

## 2020-08-05 DIAGNOSIS — Z96653 Presence of artificial knee joint, bilateral: Secondary | ICD-10-CM | POA: Diagnosis not present

## 2020-08-05 DIAGNOSIS — N182 Chronic kidney disease, stage 2 (mild): Secondary | ICD-10-CM | POA: Diagnosis not present

## 2020-08-05 DIAGNOSIS — Z90710 Acquired absence of both cervix and uterus: Secondary | ICD-10-CM | POA: Diagnosis not present

## 2020-08-05 DIAGNOSIS — I129 Hypertensive chronic kidney disease with stage 1 through stage 4 chronic kidney disease, or unspecified chronic kidney disease: Secondary | ICD-10-CM | POA: Diagnosis not present

## 2020-08-05 DIAGNOSIS — D631 Anemia in chronic kidney disease: Secondary | ICD-10-CM | POA: Diagnosis not present

## 2020-08-05 DIAGNOSIS — Z96642 Presence of left artificial hip joint: Secondary | ICD-10-CM | POA: Insufficient documentation

## 2020-08-05 DIAGNOSIS — M1990 Unspecified osteoarthritis, unspecified site: Secondary | ICD-10-CM | POA: Diagnosis not present

## 2020-08-05 DIAGNOSIS — D649 Anemia, unspecified: Secondary | ICD-10-CM

## 2020-08-05 DIAGNOSIS — R5383 Other fatigue: Secondary | ICD-10-CM | POA: Diagnosis not present

## 2020-08-05 LAB — CMP (CANCER CENTER ONLY)
ALT: 17 U/L (ref 0–44)
AST: 23 U/L (ref 15–41)
Albumin: 3.4 g/dL — ABNORMAL LOW (ref 3.5–5.0)
Alkaline Phosphatase: 60 U/L (ref 38–126)
Anion gap: 9 (ref 5–15)
BUN: 35 mg/dL — ABNORMAL HIGH (ref 8–23)
CO2: 23 mmol/L (ref 22–32)
Calcium: 9.3 mg/dL (ref 8.9–10.3)
Chloride: 103 mmol/L (ref 98–111)
Creatinine: 1.12 mg/dL — ABNORMAL HIGH (ref 0.44–1.00)
GFR, Estimated: 47 mL/min — ABNORMAL LOW (ref >60)
Glucose, Bld: 111 mg/dL — ABNORMAL HIGH (ref 70–99)
Potassium: 4.7 mmol/L (ref 3.5–5.1)
Sodium: 135 mmol/L (ref 135–145)
Total Bilirubin: 0.3 mg/dL (ref 0.3–1.2)
Total Protein: 7.5 g/dL (ref 6.5–8.1)

## 2020-08-05 LAB — IRON AND TIBC
Iron: 85 ug/dL (ref 41–142)
Saturation Ratios: 32 % (ref 21–57)
TIBC: 269 ug/dL (ref 236–444)
UIBC: 184 ug/dL (ref 120–384)

## 2020-08-05 LAB — CBC WITH DIFFERENTIAL (CANCER CENTER ONLY)
Abs Immature Granulocytes: 0.05 10*3/uL (ref 0.00–0.07)
Basophils Absolute: 0.2 10*3/uL — ABNORMAL HIGH (ref 0.0–0.1)
Basophils Relative: 2 %
Eosinophils Absolute: 0.4 10*3/uL (ref 0.0–0.5)
Eosinophils Relative: 4 %
HCT: 30.6 % — ABNORMAL LOW (ref 36.0–46.0)
Hemoglobin: 9.7 g/dL — ABNORMAL LOW (ref 12.0–15.0)
Immature Granulocytes: 1 %
Lymphocytes Relative: 25 %
Lymphs Abs: 2.5 10*3/uL (ref 0.7–4.0)
MCH: 29.6 pg (ref 26.0–34.0)
MCHC: 31.7 g/dL (ref 30.0–36.0)
MCV: 93.3 fL (ref 80.0–100.0)
Monocytes Absolute: 0.9 10*3/uL (ref 0.1–1.0)
Monocytes Relative: 9 %
Neutro Abs: 6.1 10*3/uL (ref 1.7–7.7)
Neutrophils Relative %: 59 %
Platelet Count: 273 10*3/uL (ref 150–400)
RBC: 3.28 MIL/uL — ABNORMAL LOW (ref 3.87–5.11)
RDW: 13.4 % (ref 11.5–15.5)
WBC Count: 10.1 10*3/uL (ref 4.0–10.5)
nRBC: 0 % (ref 0.0–0.2)

## 2020-08-05 LAB — SAMPLE TO BLOOD BANK

## 2020-08-05 LAB — FERRITIN: Ferritin: 473 ng/mL — ABNORMAL HIGH (ref 11–307)

## 2020-08-06 ENCOUNTER — Telehealth: Payer: Self-pay | Admitting: Hematology

## 2020-08-06 NOTE — Telephone Encounter (Signed)
Left message with follow-up appointment per 5/24 los. Gave option to call back to reschedule if needed.

## 2020-08-07 DIAGNOSIS — N390 Urinary tract infection, site not specified: Secondary | ICD-10-CM | POA: Diagnosis not present

## 2020-08-07 DIAGNOSIS — I10 Essential (primary) hypertension: Secondary | ICD-10-CM | POA: Diagnosis not present

## 2020-08-07 DIAGNOSIS — F419 Anxiety disorder, unspecified: Secondary | ICD-10-CM | POA: Diagnosis not present

## 2020-08-11 ENCOUNTER — Encounter: Payer: Self-pay | Admitting: Hematology

## 2020-08-11 DIAGNOSIS — Z1159 Encounter for screening for other viral diseases: Secondary | ICD-10-CM | POA: Diagnosis not present

## 2020-08-11 DIAGNOSIS — Z20828 Contact with and (suspected) exposure to other viral communicable diseases: Secondary | ICD-10-CM | POA: Diagnosis not present

## 2020-08-12 ENCOUNTER — Ambulatory Visit: Payer: Medicare Other | Admitting: Hematology

## 2020-08-12 ENCOUNTER — Telehealth: Payer: Self-pay | Admitting: Interventional Cardiology

## 2020-08-12 ENCOUNTER — Other Ambulatory Visit: Payer: Medicare Other

## 2020-08-12 DIAGNOSIS — I351 Nonrheumatic aortic (valve) insufficiency: Secondary | ICD-10-CM

## 2020-08-12 DIAGNOSIS — I10 Essential (primary) hypertension: Secondary | ICD-10-CM

## 2020-08-12 NOTE — Telephone Encounter (Signed)
I spoke with patient. She reports she is always very tired. Has to take a deep breath at times to catch her breath. Feels heart pounding during the night at times.  Is not fast. Does not notice during the day. These symptoms are not new but patient reports she did not discuss at last office visit with Dr Tamala Julian. After the visit she began wondering if these symptoms could be related to her heart murmur. Patient did not start HCTZ.  She was concerned about possible effects on sodium level and electrolytes. She reports the following BP/pulse readings- 5/29- 134/69, 78 5/30-149/80,71 5/31-143/70, 64

## 2020-08-12 NOTE — Telephone Encounter (Signed)
Pt c/o Shortness Of Breath: STAT if SOB developed within the last 24 hours or pt is noticeably SOB on the phone  1. Are you currently SOB (can you hear that pt is SOB on the phone)? No  2. How long have you been experiencing SOB? Since last appt  3. Are you SOB when sitting or when up moving around? Moving around  4. Are you currently experiencing any other symptoms? Very tired, fatigued, hard to catch breath

## 2020-08-13 DIAGNOSIS — M9903 Segmental and somatic dysfunction of lumbar region: Secondary | ICD-10-CM | POA: Diagnosis not present

## 2020-08-13 DIAGNOSIS — M9902 Segmental and somatic dysfunction of thoracic region: Secondary | ICD-10-CM | POA: Diagnosis not present

## 2020-08-13 DIAGNOSIS — M4726 Other spondylosis with radiculopathy, lumbar region: Secondary | ICD-10-CM | POA: Diagnosis not present

## 2020-08-13 DIAGNOSIS — M9901 Segmental and somatic dysfunction of cervical region: Secondary | ICD-10-CM | POA: Diagnosis not present

## 2020-08-14 NOTE — Telephone Encounter (Signed)
Patient following up.

## 2020-08-14 NOTE — Telephone Encounter (Signed)
Spoke with pt and reviewed information from Dr. Tamala Julian.  Pt willing to try HCTZ now.  She will come for labs on 6/13.  Pt appreciative for call.

## 2020-08-14 NOTE — Telephone Encounter (Signed)
Leaky heart valve can cause one to feel heart beating because the stroke volume is increased (leaky valve requires each heart beat to pump a larger volume).

## 2020-08-14 NOTE — Telephone Encounter (Signed)
Spoke with pt and made her aware that I am waiting to hear back from Dr. Tamala Julian.  Pt appreciative for call.

## 2020-08-18 DIAGNOSIS — Z1159 Encounter for screening for other viral diseases: Secondary | ICD-10-CM | POA: Diagnosis not present

## 2020-08-18 DIAGNOSIS — Z20828 Contact with and (suspected) exposure to other viral communicable diseases: Secondary | ICD-10-CM | POA: Diagnosis not present

## 2020-08-21 DIAGNOSIS — Z1159 Encounter for screening for other viral diseases: Secondary | ICD-10-CM | POA: Diagnosis not present

## 2020-08-21 DIAGNOSIS — Z20828 Contact with and (suspected) exposure to other viral communicable diseases: Secondary | ICD-10-CM | POA: Diagnosis not present

## 2020-08-22 ENCOUNTER — Telehealth: Payer: Self-pay | Admitting: Interventional Cardiology

## 2020-08-22 NOTE — Telephone Encounter (Signed)
Spoke with pt and she states she has been waking up 4 times a night on the days she takes HCTZ.  Waking up once or twice on the nights she doesn't.  Does feel better since starting HCTZ.  Explained this was for BP and to see if getting fluid off helped with her SOB she called in with.  Advised pt to continue the HCTZ and hopefully the increased urination will improve as we get the initial fluid off of her.  Pt states she increased her Coreg to 12.5 BID on her own because she doesn't really want to be on a diuretic.  Dr. Tamala Julian did mention that if pt felt better off the HCTZ, she could stop it.  Pt states she is not going to take the HCTZ next week and see how she does.

## 2020-08-22 NOTE — Telephone Encounter (Signed)
Pt c/o medication issue:  1. Name of Medication: hydrochlorothiazide (MICROZIDE) 12.5 MG capsule  2. How are you currently taking this medication (dosage and times per day)?  As directed. Take 1 capsule (12.5 mg total) by mouth every Monday, Wednesday, and Friday.  3. Are you having a reaction (difficulty breathing--STAT)? Frequent Urination during the night, atleast 4X per night  4. What is your medication issue? Pt is having frequent urination during the night. Pt wants to know if she is taking this medicine  for BP or for her Heart Mur Mur, she is not sure why she is taking medicine. Please advise

## 2020-08-25 ENCOUNTER — Other Ambulatory Visit: Payer: Medicare Other

## 2020-08-25 ENCOUNTER — Other Ambulatory Visit: Payer: Self-pay

## 2020-08-25 DIAGNOSIS — Z1159 Encounter for screening for other viral diseases: Secondary | ICD-10-CM | POA: Diagnosis not present

## 2020-08-25 DIAGNOSIS — I351 Nonrheumatic aortic (valve) insufficiency: Secondary | ICD-10-CM

## 2020-08-25 DIAGNOSIS — I10 Essential (primary) hypertension: Secondary | ICD-10-CM | POA: Diagnosis not present

## 2020-08-25 DIAGNOSIS — Z20828 Contact with and (suspected) exposure to other viral communicable diseases: Secondary | ICD-10-CM | POA: Diagnosis not present

## 2020-08-26 LAB — BASIC METABOLIC PANEL
BUN/Creatinine Ratio: 31 — ABNORMAL HIGH (ref 12–28)
BUN: 38 mg/dL — ABNORMAL HIGH (ref 8–27)
CO2: 21 mmol/L (ref 20–29)
Calcium: 8.8 mg/dL (ref 8.7–10.3)
Chloride: 101 mmol/L (ref 96–106)
Creatinine, Ser: 1.21 mg/dL — ABNORMAL HIGH (ref 0.57–1.00)
Glucose: 123 mg/dL — ABNORMAL HIGH (ref 65–99)
Potassium: 4.5 mmol/L (ref 3.5–5.2)
Sodium: 136 mmol/L (ref 134–144)
eGFR: 43 mL/min/{1.73_m2} — ABNORMAL LOW (ref >59)

## 2020-09-01 DIAGNOSIS — D692 Other nonthrombocytopenic purpura: Secondary | ICD-10-CM | POA: Diagnosis not present

## 2020-09-01 DIAGNOSIS — L570 Actinic keratosis: Secondary | ICD-10-CM | POA: Diagnosis not present

## 2020-09-01 DIAGNOSIS — L821 Other seborrheic keratosis: Secondary | ICD-10-CM | POA: Diagnosis not present

## 2020-09-01 DIAGNOSIS — I8311 Varicose veins of right lower extremity with inflammation: Secondary | ICD-10-CM | POA: Diagnosis not present

## 2020-09-01 DIAGNOSIS — Z20828 Contact with and (suspected) exposure to other viral communicable diseases: Secondary | ICD-10-CM | POA: Diagnosis not present

## 2020-09-01 DIAGNOSIS — Z85828 Personal history of other malignant neoplasm of skin: Secondary | ICD-10-CM | POA: Diagnosis not present

## 2020-09-01 DIAGNOSIS — I8312 Varicose veins of left lower extremity with inflammation: Secondary | ICD-10-CM | POA: Diagnosis not present

## 2020-09-01 DIAGNOSIS — I872 Venous insufficiency (chronic) (peripheral): Secondary | ICD-10-CM | POA: Diagnosis not present

## 2020-09-01 DIAGNOSIS — L905 Scar conditions and fibrosis of skin: Secondary | ICD-10-CM | POA: Diagnosis not present

## 2020-09-01 DIAGNOSIS — Z1159 Encounter for screening for other viral diseases: Secondary | ICD-10-CM | POA: Diagnosis not present

## 2020-09-03 DIAGNOSIS — M9902 Segmental and somatic dysfunction of thoracic region: Secondary | ICD-10-CM | POA: Diagnosis not present

## 2020-09-03 DIAGNOSIS — M4726 Other spondylosis with radiculopathy, lumbar region: Secondary | ICD-10-CM | POA: Diagnosis not present

## 2020-09-03 DIAGNOSIS — M9903 Segmental and somatic dysfunction of lumbar region: Secondary | ICD-10-CM | POA: Diagnosis not present

## 2020-09-03 DIAGNOSIS — M9901 Segmental and somatic dysfunction of cervical region: Secondary | ICD-10-CM | POA: Diagnosis not present

## 2020-09-15 DIAGNOSIS — Z20828 Contact with and (suspected) exposure to other viral communicable diseases: Secondary | ICD-10-CM | POA: Diagnosis not present

## 2020-09-15 DIAGNOSIS — Z1159 Encounter for screening for other viral diseases: Secondary | ICD-10-CM | POA: Diagnosis not present

## 2020-09-22 ENCOUNTER — Encounter: Payer: Self-pay | Admitting: Podiatry

## 2020-09-22 ENCOUNTER — Ambulatory Visit (INDEPENDENT_AMBULATORY_CARE_PROVIDER_SITE_OTHER): Payer: Medicare Other | Admitting: Podiatry

## 2020-09-22 ENCOUNTER — Other Ambulatory Visit: Payer: Self-pay

## 2020-09-22 DIAGNOSIS — M79675 Pain in left toe(s): Secondary | ICD-10-CM | POA: Diagnosis not present

## 2020-09-22 DIAGNOSIS — Z1159 Encounter for screening for other viral diseases: Secondary | ICD-10-CM | POA: Diagnosis not present

## 2020-09-22 DIAGNOSIS — B351 Tinea unguium: Secondary | ICD-10-CM | POA: Diagnosis not present

## 2020-09-22 DIAGNOSIS — Q828 Other specified congenital malformations of skin: Secondary | ICD-10-CM

## 2020-09-22 DIAGNOSIS — M79674 Pain in right toe(s): Secondary | ICD-10-CM | POA: Diagnosis not present

## 2020-09-22 DIAGNOSIS — M2031 Hallux varus (acquired), right foot: Secondary | ICD-10-CM

## 2020-09-22 DIAGNOSIS — Z20828 Contact with and (suspected) exposure to other viral communicable diseases: Secondary | ICD-10-CM | POA: Diagnosis not present

## 2020-09-22 NOTE — Progress Notes (Signed)
This patient returns to the office for evaluation and treatment of long thick painful nails .  This patient is unable to trim his own nails since the patient cannot reach his feet.  Patient says the nails are painful walking and wearing his shoes.   She has painful callus on the bottom of her right big toe. She returns for preventive foot care services.  General Appearance  Alert, conversant and in no acute stress.  Vascular  Dorsalis pedis and posterior tibial  pulses are palpable  bilaterally.  Capillary return is within normal limits  bilaterally. Temperature is within normal limits  bilaterally.  Neurologic  Senn-Weinstein monofilament wire test within normal limits  bilaterally. Muscle power within normal limits bilaterally.  Nails Thick disfigured discolored nails with subungual debris  from hallux to fifth toes bilaterally. No evidence of bacterial infection or drainage bilaterally.  Orthopedic  No limitations of motion  feet .  No crepitus or effusions noted.  No bony pathology or digital deformities noted. 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  Skin  normotropic  bilaterally.  No signs of infections or ulcers noted.   Symptomatic callus right hallux.  Clavi 3rd toe left foot. Asymptomatic.  Onychomycosis  Pain in toes right foot  Pain in toes left foot  Callus right foot.  Debridement  of nails  1-5  B/L with a nail nipper.  Nails were then filed using a dremel tool with no incidents.  Debride callus with # 15 blade.     RTC 9 weeks     Gardiner Barefoot DPM

## 2020-09-23 ENCOUNTER — Telehealth: Payer: Self-pay | Admitting: Interventional Cardiology

## 2020-09-23 DIAGNOSIS — I351 Nonrheumatic aortic (valve) insufficiency: Secondary | ICD-10-CM

## 2020-09-23 NOTE — Telephone Encounter (Signed)
Pt is calling in to report her bp:  06/23 -  134/78 06/24 -   141/84 06/28 -  132/73 06/30 -  136/76   07/01 -  116/66 07/02 -  122/68 07/05 -  127/74 07/06 -  157/86 07/07 -  144/82 07/09 - 113/64 07/12 - 136/78

## 2020-09-23 NOTE — Telephone Encounter (Signed)
All pressures are acceptable.

## 2020-09-23 NOTE — Telephone Encounter (Signed)
Spoke with pt and made her aware order being placed for echo.  Pt appreciative for call.

## 2020-09-23 NOTE — Telephone Encounter (Signed)
We decided some years ago that we were not going to serially image the Aorta and I assumed the heart and echo as well. I am akay with echo, but she has decided not to have any type intervention.

## 2020-09-23 NOTE — Addendum Note (Signed)
Addended by: Loren Racer on: 09/23/2020 04:23 PM   Modules accepted: Orders

## 2020-09-23 NOTE — Telephone Encounter (Signed)
Spoke with pt and made her aware BPs look ok.  Pt mentioned that she has had increased fatigue and wonders if it isn't due to her AV insufficiency.  Last echo was 2017.  Advised I will send to Dr. Tamala Julian to see if we need to get an updated echo.

## 2020-09-24 DIAGNOSIS — M9902 Segmental and somatic dysfunction of thoracic region: Secondary | ICD-10-CM | POA: Diagnosis not present

## 2020-09-24 DIAGNOSIS — M9901 Segmental and somatic dysfunction of cervical region: Secondary | ICD-10-CM | POA: Diagnosis not present

## 2020-09-24 DIAGNOSIS — M9903 Segmental and somatic dysfunction of lumbar region: Secondary | ICD-10-CM | POA: Diagnosis not present

## 2020-09-24 DIAGNOSIS — M4726 Other spondylosis with radiculopathy, lumbar region: Secondary | ICD-10-CM | POA: Diagnosis not present

## 2020-09-29 DIAGNOSIS — Z1159 Encounter for screening for other viral diseases: Secondary | ICD-10-CM | POA: Diagnosis not present

## 2020-09-29 DIAGNOSIS — Z20828 Contact with and (suspected) exposure to other viral communicable diseases: Secondary | ICD-10-CM | POA: Diagnosis not present

## 2020-10-06 DIAGNOSIS — Z1159 Encounter for screening for other viral diseases: Secondary | ICD-10-CM | POA: Diagnosis not present

## 2020-10-06 DIAGNOSIS — Z20828 Contact with and (suspected) exposure to other viral communicable diseases: Secondary | ICD-10-CM | POA: Diagnosis not present

## 2020-10-08 DIAGNOSIS — M9901 Segmental and somatic dysfunction of cervical region: Secondary | ICD-10-CM | POA: Diagnosis not present

## 2020-10-08 DIAGNOSIS — M4726 Other spondylosis with radiculopathy, lumbar region: Secondary | ICD-10-CM | POA: Diagnosis not present

## 2020-10-08 DIAGNOSIS — M9902 Segmental and somatic dysfunction of thoracic region: Secondary | ICD-10-CM | POA: Diagnosis not present

## 2020-10-08 DIAGNOSIS — M9903 Segmental and somatic dysfunction of lumbar region: Secondary | ICD-10-CM | POA: Diagnosis not present

## 2020-10-09 DIAGNOSIS — Z20828 Contact with and (suspected) exposure to other viral communicable diseases: Secondary | ICD-10-CM | POA: Diagnosis not present

## 2020-10-09 DIAGNOSIS — Z1159 Encounter for screening for other viral diseases: Secondary | ICD-10-CM | POA: Diagnosis not present

## 2020-10-13 DIAGNOSIS — Z1159 Encounter for screening for other viral diseases: Secondary | ICD-10-CM | POA: Diagnosis not present

## 2020-10-13 DIAGNOSIS — Z20828 Contact with and (suspected) exposure to other viral communicable diseases: Secondary | ICD-10-CM | POA: Diagnosis not present

## 2020-10-15 ENCOUNTER — Ambulatory Visit (HOSPITAL_COMMUNITY): Payer: Medicare Other | Attending: Cardiovascular Disease

## 2020-10-15 ENCOUNTER — Other Ambulatory Visit: Payer: Self-pay

## 2020-10-15 DIAGNOSIS — I351 Nonrheumatic aortic (valve) insufficiency: Secondary | ICD-10-CM | POA: Diagnosis not present

## 2020-10-15 LAB — ECHOCARDIOGRAM COMPLETE
Area-P 1/2: 5.66 cm2
P 1/2 time: 598 msec
S' Lateral: 2.4 cm

## 2020-10-17 ENCOUNTER — Other Ambulatory Visit: Payer: Self-pay | Admitting: Internal Medicine

## 2020-10-17 DIAGNOSIS — Z1231 Encounter for screening mammogram for malignant neoplasm of breast: Secondary | ICD-10-CM

## 2020-10-22 DIAGNOSIS — Z8616 Personal history of COVID-19: Secondary | ICD-10-CM | POA: Diagnosis not present

## 2020-10-27 DIAGNOSIS — Z20828 Contact with and (suspected) exposure to other viral communicable diseases: Secondary | ICD-10-CM | POA: Diagnosis not present

## 2020-11-03 ENCOUNTER — Other Ambulatory Visit: Payer: Self-pay | Admitting: Internal Medicine

## 2020-11-03 DIAGNOSIS — M9901 Segmental and somatic dysfunction of cervical region: Secondary | ICD-10-CM | POA: Diagnosis not present

## 2020-11-03 DIAGNOSIS — E2839 Other primary ovarian failure: Secondary | ICD-10-CM

## 2020-11-03 DIAGNOSIS — M4726 Other spondylosis with radiculopathy, lumbar region: Secondary | ICD-10-CM | POA: Diagnosis not present

## 2020-11-03 DIAGNOSIS — M9903 Segmental and somatic dysfunction of lumbar region: Secondary | ICD-10-CM | POA: Diagnosis not present

## 2020-11-03 DIAGNOSIS — M9902 Segmental and somatic dysfunction of thoracic region: Secondary | ICD-10-CM | POA: Diagnosis not present

## 2020-11-05 DIAGNOSIS — Z20828 Contact with and (suspected) exposure to other viral communicable diseases: Secondary | ICD-10-CM | POA: Diagnosis not present

## 2020-11-10 DIAGNOSIS — Z8616 Personal history of COVID-19: Secondary | ICD-10-CM | POA: Diagnosis not present

## 2020-11-14 DIAGNOSIS — R5383 Other fatigue: Secondary | ICD-10-CM | POA: Diagnosis not present

## 2020-11-17 DIAGNOSIS — Z8616 Personal history of COVID-19: Secondary | ICD-10-CM | POA: Diagnosis not present

## 2020-11-19 ENCOUNTER — Ambulatory Visit: Payer: Medicare Other | Admitting: Hematology

## 2020-11-19 ENCOUNTER — Other Ambulatory Visit: Payer: Medicare Other

## 2020-12-01 ENCOUNTER — Other Ambulatory Visit: Payer: Self-pay

## 2020-12-01 ENCOUNTER — Encounter: Payer: Self-pay | Admitting: Podiatry

## 2020-12-01 ENCOUNTER — Ambulatory Visit (INDEPENDENT_AMBULATORY_CARE_PROVIDER_SITE_OTHER): Payer: Medicare Other | Admitting: Podiatry

## 2020-12-01 DIAGNOSIS — M2031 Hallux varus (acquired), right foot: Secondary | ICD-10-CM

## 2020-12-01 DIAGNOSIS — B351 Tinea unguium: Secondary | ICD-10-CM

## 2020-12-01 DIAGNOSIS — M79674 Pain in right toe(s): Secondary | ICD-10-CM

## 2020-12-01 DIAGNOSIS — Z20828 Contact with and (suspected) exposure to other viral communicable diseases: Secondary | ICD-10-CM | POA: Diagnosis not present

## 2020-12-01 DIAGNOSIS — M79675 Pain in left toe(s): Secondary | ICD-10-CM

## 2020-12-01 DIAGNOSIS — L608 Other nail disorders: Secondary | ICD-10-CM | POA: Diagnosis not present

## 2020-12-01 NOTE — Progress Notes (Signed)
This patient returns to the office for evaluation and treatment of long thick painful nails .  This patient is unable to trim his own nails since the patient cannot reach his feet.  Patient says the nails are painful walking and wearing his shoes.   She has painful callus on the bottom of her right big toe. She returns for preventive foot care services.  General Appearance  Alert, conversant and in no acute stress.  Vascular  Dorsalis pedis and posterior tibial  pulses are palpable  bilaterally.  Capillary return is within normal limits  bilaterally. Temperature is within normal limits  bilaterally.  Neurologic  Senn-Weinstein monofilament wire test within normal limits  bilaterally. Muscle power within normal limits bilaterally.  Nails Thick disfigured discolored nails with subungual debris  from hallux to fifth toes bilaterally. No evidence of bacterial infection or drainage bilaterally. Pincer fourth toenails  B/l.  Orthopedic  No limitations of motion  feet .  No crepitus or effusions noted.  No bony pathology or digital deformities noted. 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  Skin  normotropic  bilaterally.  No signs of infections or ulcers noted.   Symptomatic callus right hallux.  Clavi 3rd toe left foot. Asymptomatic.  Onychomycosis  Pain in toes right foot  Pain in toes left foot  Callus right foot.  Debridement  of nails  1-5  B/L with a nail nipper.  Nails were then filed using a dremel tool with no incidents.      RTC 9 weeks     Gardiner Barefoot DPM

## 2020-12-08 ENCOUNTER — Other Ambulatory Visit: Payer: Self-pay

## 2020-12-08 ENCOUNTER — Ambulatory Visit
Admission: RE | Admit: 2020-12-08 | Discharge: 2020-12-08 | Disposition: A | Discharge status: Home / Self Care | Payer: Medicare Other | Source: Ambulatory Visit | Attending: Internal Medicine | Admitting: Internal Medicine

## 2020-12-08 DIAGNOSIS — Z20828 Contact with and (suspected) exposure to other viral communicable diseases: Secondary | ICD-10-CM | POA: Diagnosis not present

## 2020-12-08 DIAGNOSIS — Z1231 Encounter for screening mammogram for malignant neoplasm of breast: Secondary | ICD-10-CM

## 2020-12-10 DIAGNOSIS — M4726 Other spondylosis with radiculopathy, lumbar region: Secondary | ICD-10-CM | POA: Diagnosis not present

## 2020-12-10 DIAGNOSIS — M9903 Segmental and somatic dysfunction of lumbar region: Secondary | ICD-10-CM | POA: Diagnosis not present

## 2020-12-10 DIAGNOSIS — M9901 Segmental and somatic dysfunction of cervical region: Secondary | ICD-10-CM | POA: Diagnosis not present

## 2020-12-10 DIAGNOSIS — M9902 Segmental and somatic dysfunction of thoracic region: Secondary | ICD-10-CM | POA: Diagnosis not present

## 2020-12-11 DIAGNOSIS — Z23 Encounter for immunization: Secondary | ICD-10-CM | POA: Diagnosis not present

## 2020-12-15 DIAGNOSIS — M79671 Pain in right foot: Secondary | ICD-10-CM | POA: Diagnosis not present

## 2020-12-15 DIAGNOSIS — Z8616 Personal history of COVID-19: Secondary | ICD-10-CM | POA: Diagnosis not present

## 2020-12-15 DIAGNOSIS — M25572 Pain in left ankle and joints of left foot: Secondary | ICD-10-CM | POA: Diagnosis not present

## 2020-12-22 ENCOUNTER — Other Ambulatory Visit: Payer: Self-pay

## 2020-12-22 ENCOUNTER — Ambulatory Visit (INDEPENDENT_AMBULATORY_CARE_PROVIDER_SITE_OTHER): Payer: Medicare Other | Admitting: Otolaryngology

## 2020-12-22 DIAGNOSIS — H6123 Impacted cerumen, bilateral: Secondary | ICD-10-CM | POA: Diagnosis not present

## 2020-12-22 DIAGNOSIS — Z20828 Contact with and (suspected) exposure to other viral communicable diseases: Secondary | ICD-10-CM | POA: Diagnosis not present

## 2020-12-22 DIAGNOSIS — L299 Pruritus, unspecified: Secondary | ICD-10-CM

## 2020-12-22 MED ORDER — BETAMETHASONE DIPROPIONATE 0.05 % EX CREA: TOPICAL_CREAM | Freq: Two times a day (BID) | CUTANEOUS | 0 refills | Status: DC

## 2020-12-22 NOTE — Progress Notes (Signed)
HPI: [name] is a 87 y.o. female who returns today for evaluation of complaints of chronic itching in her right ear.  She wanted her ear checked..  Past Medical History:  Diagnosis Date   Anemia    CHRONIC   Aortic insufficiency    a. 2D echo 08/29/15: EF 55-60%, mild LVH, diastolic dysfunction, elevated LV filling pressure, mild AI, severe LAE, mild RAE, mild TR, dilated descending thoracic aorta just distal to the takeoff of the left subclavian, measuring 4.1 cm, no pericardial effusion.   Arthritis    OA-SOME BACK AND NECK PAIN,  GOES TO CHIROPRACTOR TWICE A MONTH;  HX OF JOINT REPLACEMENTS    Cancer ([place])    SKIN CANCERS REMOVED FROM LEGS   CKD (chronic kidney disease), stage II    Essential hypertension    Gait abnormality 09/08/2017   GERD (gastroesophageal reflux disease)    PSVT (paroxysmal supraventricular tachycardia) (HCC)    Recurrent Pericarditis    RECURRENT    Thoracic aneurysm    a. Followed by [facility] -last seen in 12/2013 w/ plan for f/u MRA and thoracic surgery f/u in 2 yrs, but patient does not wish to follow any longer.   Past Surgical History:  Procedure Laterality Date   APPENDECTOMY     BREAST EXCISIONAL BIOPSY Left 1992   benign   EXCISION/RELEASE BURSA HIP Left 11/24/2012   Procedure: LEFT HIP BURSECTOMY AND TENDON REPAIR ;  Surgeon: Gearlean Alf, MD;  Location: [facility];  Service: Orthopedics;  Laterality: Left;   HIP CLOSED REDUCTION Left 12/19/2012   Procedure: CLOSED MANIPULATION HIP;  Surgeon: Mauri Pole, MD;  Location: [facility];  Service: Orthopedics;  Laterality: Left;   HIP CLOSED REDUCTION Left 12/05/2013   Procedure: CLOSED MANIPULATION HIP;  Surgeon: Augustin Schooling, MD;  Location: [facility];  Service: Orthopedics;  Laterality: Left;   HIP CLOSED REDUCTION Left 09/22/2015   Procedure: CLOSED REDUCTION HIP;  Surgeon: Melina Schools, MD;  Location: [facility];  Service: Orthopedics;  Laterality: Left;   JOINT REPLACEMENT     LEFT TOTAL HIP  REPLACEMENT AND REVISIONS X 2   OOPHORECTOMY     has a partial of one ovary remaingin   PELVIC LAPAROSCOPY     ovarian cyst removal,    REVISION TOTAL HIP ARTHROPLASTY     left   right Achilles Tendon repair  5/13   TOTAL KNEE ARTHROPLASTY     right   TOTAL KNEE ARTHROPLASTY     left   VAGINAL HYSTERECTOMY     with ovarian cyst removal   Social History   Socioeconomic History   Marital status: Widowed    Spouse name: Not on file   Number of children: Not on file   Years of education: Not on file   Highest education level: Not on file  Occupational History   Not on file  Tobacco Use   Smoking status: Former    Types: Cigarettes   Smokeless tobacco: Never   Tobacco comments:    quit age 11  Vaping Use   Vaping Use: Never used  Substance and Sexual Activity   Alcohol use: No    Alcohol/week: 0.0 standard drinks   Drug use: No   Sexual activity: Not Currently    Birth control/protection: Surgical    Comment: HYST-1st intercourse 87 yo-Fewer than 5 partners  Other Topics Concern   Not on file  Social History Narrative   Not on file   Social Determinants  of Health   Financial Resource Strain: Not on file  Food Insecurity: Not on file  Transportation Needs: Not on file  Physical Activity: Not on file  Stress: Not on file  Social Connections: Not on file   Family History  Problem Relation Age of Onset   Hypertension Mother    Stroke Mother    Heart attack Father 43   ALS Other    Breast cancer Neg Hx    Allergies  Allergen Reactions   Cephalexin Other (See Comments)    Pt states this causes eye problems and STROKE-LIKE SYMPTOMS!! Other reaction(s): stroke symptoms   Shellfish Allergy Hives and Rash   Shellfish-Derived Products Hives and Rash    Other reaction(s): hives   Clindamycin/Lincomycin Rash   Lincomycin Rash   Other     Other reaction(s): rash   Penicillamine    Sugar-Protein-Starch Other (See Comments)    Sugary foods cause legs to cramp    Tape Other (See Comments)    Please use paper tape or Coban Wrap; patient's skin tears VERY easily!!   Ciprofloxacin Rash    Other reaction(s): rash   Sulfa Antibiotics Hives and Rash   Sulfites Hives and Rash    Other reaction(s): swelling   Prior to Admission medications   Medication Sig Start Date End Date Taking? Authorizing Provider  acetic acid-hydrocortisone (VOSOL-HC) OTIC solution  02/26/20   [provider]  amoxicillin (AMOXIL) 500 MG capsule SMARTSIG:4 Capsule(s) By Mouth 04/10/20   [provider]  aspirin 81 MG tablet Take 81 mg by mouth daily.    [provider]  budesonide (ENTOCORT EC) 3 MG 24 hr capsule Take 9 mg by mouth daily. 03/28/20   [provider]  Calcium Carbonate-Vit D-Min (CALCIUM 1200 PO) Take 1 tablet by mouth daily.     [provider]  carboxymethylcellulose (REFRESH PLUS) 0.5 % SOLN Place 1 drop into both eyes daily.    [provider]  carvedilol (COREG) 12.5 MG tablet Take 12.5 mg by mouth. 1/2 in the Am; 1 in the PM    [provider]  cholecalciferol (VITAMIN D) 1000 UNITS tablet Take 1,000 Units by mouth daily.    [provider]  clobetasol cream (TEMOVATE) 4.09 % Apply 1 application topically 2 (two) times daily as needed (itching).  08/08/13   [provider]  diltiazem (CARDIZEM CD) 240 MG 24 hr capsule Take 1 capsule (240 mg total) by mouth daily. 12/26/19   Belva Crome, MD  estradiol (ESTRACE) 0.5 MG tablet Take 1 tablet (0.5 mg total) by mouth daily. 06/19/20   Princess Bruins, MD  ferrous sulfate 325 (65 FE) MG tablet Take 325 mg by mouth daily. 01/24/20   [provider]  hydrochlorothiazide (MICROZIDE) 12.5 MG capsule Take 1 capsule (12.5 mg total) by mouth every Monday, Wednesday, and Friday. 08/04/20   Belva Crome, MD  meloxicam (MOBIC) 7.5 MG tablet Take 7.5 mg by mouth daily as needed for pain.  03/21/15   [provider]  omega-3 acid ethyl  esters (LOVAZA) 1 g capsule Take 1 g by mouth daily.     [provider]  omeprazole (PRILOSEC) 40 MG capsule Take 40 mg by mouth daily. 05/03/20   [provider]  oxybutynin (DITROPAN) 5 MG tablet Take by mouth. 01/31/20   [provider]  sodium chloride 1 g tablet Take 1 g by mouth daily. 08/21/19   [provider]  tretinoin (RETIN-A) 0.1 % cream  SMARTSIG:Sparingly Topical Every Night PRN 04/29/20   [provider]     Positive ROS: Otherwise negative  All other systems have been reviewed and were otherwise negative with the exception of those mentioned in the HPI and as above.  Physical Exam: Constitutional: Alert, well-appearing, no acute distress Ears: External ears without lesions or tenderness. Ear canals with a little bit of wax buildup in both ear canals that was cleaned with curette and forceps but this was minimal and not obstructing.  TMs were clear bilaterally.  She has some slight dryness and scaling of the opening of the right ear canal. Nasal: External nose without lesions. Septum relatively midline.. Clear nasal passages Oral: Lips and gums without lesions. Tongue and palate mucosa without lesions. Posterior oropharynx clear. Neck: No palpable adenopathy or masses Respiratory: Breathing comfortably  Skin: No facial/neck lesions or rash noted.  Cerumen impaction removal  Date/Time: 12/22/2020 2:49 PM Performed by: Rozetta Nunnery, MD Authorized by: Rozetta Nunnery, MD   Consent:    Consent obtained:  Verbal   Consent given by:  Patient   Risks discussed:  Pain and bleeding Procedure details:    Location:  L ear and R ear   Procedure type: curette and forceps   Post-procedure details:    Inspection:  TM intact and canal normal   Hearing quality:  Improved   Procedure completion:  Tolerated well, no immediate complications Comments:     Minimal dry wax in both ear canals that was cleaned with curette and  forceps.  TMs are clear bilaterally.  No inflammatory changes of the ear canals.  Assessment: Itching in the right ear canal with minimal wax buildup.  Plan: Recommended use of Diprolene 0.05% cream or lotion to apply to the right ear canal twice daily for 4 to 5 days and this should help resolve the itching. She will follow-up as needed   Radene Journey, MD

## 2020-12-29 DIAGNOSIS — M9902 Segmental and somatic dysfunction of thoracic region: Secondary | ICD-10-CM | POA: Diagnosis not present

## 2020-12-29 DIAGNOSIS — M9903 Segmental and somatic dysfunction of lumbar region: Secondary | ICD-10-CM | POA: Diagnosis not present

## 2020-12-29 DIAGNOSIS — M4726 Other spondylosis with radiculopathy, lumbar region: Secondary | ICD-10-CM | POA: Diagnosis not present

## 2020-12-29 DIAGNOSIS — M9901 Segmental and somatic dysfunction of cervical region: Secondary | ICD-10-CM | POA: Diagnosis not present

## 2020-12-31 DIAGNOSIS — Z20828 Contact with and (suspected) exposure to other viral communicable diseases: Secondary | ICD-10-CM | POA: Diagnosis not present

## 2021-01-05 ENCOUNTER — Emergency Department (HOSPITAL_BASED_OUTPATIENT_CLINIC_OR_DEPARTMENT_OTHER): Payer: Medicare Other

## 2021-01-05 ENCOUNTER — Emergency Department (HOSPITAL_BASED_OUTPATIENT_CLINIC_OR_DEPARTMENT_OTHER): Payer: Medicare Other | Admitting: Radiology

## 2021-01-05 ENCOUNTER — Encounter (HOSPITAL_BASED_OUTPATIENT_CLINIC_OR_DEPARTMENT_OTHER): Payer: Self-pay

## 2021-01-05 ENCOUNTER — Other Ambulatory Visit: Payer: Self-pay

## 2021-01-05 ENCOUNTER — Encounter (HOSPITAL_COMMUNITY): Payer: Self-pay | Admitting: Emergency Medicine

## 2021-01-05 ENCOUNTER — Ambulatory Visit (INDEPENDENT_AMBULATORY_CARE_PROVIDER_SITE_OTHER): Payer: Medicare Other

## 2021-01-05 ENCOUNTER — Ambulatory Visit (HOSPITAL_COMMUNITY): Payer: Medicare Other

## 2021-01-05 ENCOUNTER — Ambulatory Visit (HOSPITAL_COMMUNITY)
Admission: EM | Admit: 2021-01-05 | Discharge: 2021-01-05 | Disposition: A | Discharge status: Home / Self Care | Payer: Medicare Other | Attending: Emergency Medicine | Admitting: Emergency Medicine

## 2021-01-05 ENCOUNTER — Emergency Department (HOSPITAL_BASED_OUTPATIENT_CLINIC_OR_DEPARTMENT_OTHER)
Admission: EM | Admit: 2021-01-05 | Discharge: 2021-01-06 | Disposition: A | Discharge status: Home / Self Care | Payer: Medicare Other | Attending: Student | Admitting: Student

## 2021-01-05 DIAGNOSIS — Z87891 Personal history of nicotine dependence: Secondary | ICD-10-CM | POA: Diagnosis not present

## 2021-01-05 DIAGNOSIS — M25572 Pain in left ankle and joints of left foot: Secondary | ICD-10-CM

## 2021-01-05 DIAGNOSIS — I129 Hypertensive chronic kidney disease with stage 1 through stage 4 chronic kidney disease, or unspecified chronic kidney disease: Secondary | ICD-10-CM | POA: Insufficient documentation

## 2021-01-05 DIAGNOSIS — W010XXA Fall on same level from slipping, tripping and stumbling without subsequent striking against object, initial encounter: Secondary | ICD-10-CM | POA: Diagnosis not present

## 2021-01-05 DIAGNOSIS — M069 Rheumatoid arthritis, unspecified: Secondary | ICD-10-CM

## 2021-01-05 DIAGNOSIS — S0181XA Laceration without foreign body of other part of head, initial encounter: Secondary | ICD-10-CM | POA: Diagnosis not present

## 2021-01-05 DIAGNOSIS — S8992XA Unspecified injury of left lower leg, initial encounter: Secondary | ICD-10-CM | POA: Diagnosis not present

## 2021-01-05 DIAGNOSIS — M7989 Other specified soft tissue disorders: Secondary | ICD-10-CM | POA: Diagnosis not present

## 2021-01-05 DIAGNOSIS — Z96642 Presence of left artificial hip joint: Secondary | ICD-10-CM | POA: Diagnosis not present

## 2021-01-05 DIAGNOSIS — R531 Weakness: Secondary | ICD-10-CM | POA: Diagnosis not present

## 2021-01-05 DIAGNOSIS — W19XXXA Unspecified fall, initial encounter: Secondary | ICD-10-CM

## 2021-01-05 DIAGNOSIS — R58 Hemorrhage, not elsewhere classified: Secondary | ICD-10-CM | POA: Diagnosis not present

## 2021-01-05 DIAGNOSIS — Z96653 Presence of artificial knee joint, bilateral: Secondary | ICD-10-CM | POA: Diagnosis not present

## 2021-01-05 DIAGNOSIS — Z7982 Long term (current) use of aspirin: Secondary | ICD-10-CM | POA: Diagnosis not present

## 2021-01-05 DIAGNOSIS — M05872 Other rheumatoid arthritis with rheumatoid factor of left ankle and foot: Secondary | ICD-10-CM | POA: Diagnosis not present

## 2021-01-05 DIAGNOSIS — M519 Unspecified thoracic, thoracolumbar and lumbosacral intervertebral disc disorder: Secondary | ICD-10-CM | POA: Diagnosis not present

## 2021-01-05 DIAGNOSIS — I10 Essential (primary) hypertension: Secondary | ICD-10-CM | POA: Diagnosis not present

## 2021-01-05 DIAGNOSIS — S0990XA Unspecified injury of head, initial encounter: Secondary | ICD-10-CM | POA: Diagnosis present

## 2021-01-05 DIAGNOSIS — Z79899 Other long term (current) drug therapy: Secondary | ICD-10-CM | POA: Insufficient documentation

## 2021-01-05 DIAGNOSIS — M79605 Pain in left leg: Secondary | ICD-10-CM | POA: Diagnosis not present

## 2021-01-05 DIAGNOSIS — M542 Cervicalgia: Secondary | ICD-10-CM | POA: Diagnosis not present

## 2021-01-05 DIAGNOSIS — M25562 Pain in left knee: Secondary | ICD-10-CM | POA: Diagnosis not present

## 2021-01-05 DIAGNOSIS — M25512 Pain in left shoulder: Secondary | ICD-10-CM | POA: Diagnosis not present

## 2021-01-05 DIAGNOSIS — N182 Chronic kidney disease, stage 2 (mild): Secondary | ICD-10-CM | POA: Diagnosis not present

## 2021-01-05 MED ORDER — OXYCODONE-ACETAMINOPHEN 5-325 MG PO TABS
1.0000 | ORAL_TABLET | Freq: Once | ORAL | Status: AC
  Administered 2021-01-05: 1 via ORAL
  Filled 2021-01-05: qty 1

## 2021-01-05 MED ORDER — LIDOCAINE HCL (PF) 1 % IJ SOLN
5.0000 mL | Freq: Once | INTRAMUSCULAR | Status: AC
  Administered 2021-01-05: 5 mL via INTRADERMAL
  Filled 2021-01-05: qty 5

## 2021-01-05 MED ORDER — OXYCODONE-ACETAMINOPHEN 5-325 MG PO TABS: 1.0000 | ORAL_TABLET | Freq: Four times a day (QID) | ORAL | 0 refills | Status: DC | PRN

## 2021-01-05 NOTE — ED Triage Notes (Signed)
Fell in hallway due to pain in ankle from arthritis.  States tripped.  Denies LOC or any dizziness.  Laceration to forehead; hematoma to left shoulder and left knee

## 2021-01-05 NOTE — Discharge Instructions (Addendum)
Take the meloxicam daily as previously prescribed.   You can also take Tylenol as needed for pain relief and fever reduction.  You can use heat, ice, or alternate between heat and ice for comfort.  You can use IcyHot, lidocaine patches, biofreeze, aspercreme, or voltaren gel as needed for pain relief.   Follow up with your primary care provider and orthopedic doctor for re-evaluation as soon as possible.

## 2021-01-05 NOTE — ED Triage Notes (Signed)
Pt c/o left foot and ankle pains for about 3 weeks. Denies any injuries.

## 2021-01-05 NOTE — ED Provider Notes (Signed)
[facility]    CSN: [id number] Arrival date & time: 01/05/21  0802      History   Chief Complaint Chief Complaint  Patient presents with   Ankle Pain    HPI [name] is a 87 y.o. female.   Patient here for evaluation of left foot and ankle pain that has been ongoing for the past 3 weeks.  Patient was seen at orthopedics approximately 2 weeks ago and had x-ray that did not show any acute bony abnormality.  Reports pain has gotten progressively worse.  Reports using arch support orthotics and meloxicam with minimal symptom relief.  Denies any trauma, injury, or other precipitating event.  Denies any specific alleviating or aggravating factors.  Denies any fevers, chest pain, shortness of breath, N/V/D, numbness, tingling, weakness, abdominal pain, or headaches.    The history is provided by the patient and a friend.  Ankle Pain  Past Medical History:  Diagnosis Date   Anemia    CHRONIC   Aortic insufficiency    a. 2D echo 08/29/15: EF 55-60%, mild LVH, diastolic dysfunction, elevated LV filling pressure, mild AI, severe LAE, mild RAE, mild TR, dilated descending thoracic aorta just distal to the takeoff of the left subclavian, measuring 4.1 cm, no pericardial effusion.   Arthritis    OA-SOME BACK AND NECK PAIN,  GOES TO CHIROPRACTOR TWICE A MONTH;  HX OF JOINT REPLACEMENTS    Cancer ([place])    SKIN CANCERS REMOVED FROM LEGS   CKD (chronic kidney disease), stage II    Essential hypertension    Gait abnormality 09/08/2017   GERD (gastroesophageal reflux disease)    PSVT (paroxysmal supraventricular tachycardia) (HCC)    Recurrent Pericarditis    RECURRENT    Thoracic aneurysm    a. Followed by [facility] -last seen in 12/2013 w/ plan for f/u MRA and thoracic surgery f/u in 2 yrs, but patient does not wish to follow any longer.    Patient Active Problem List   Diagnosis Date Noted   Pincer nail deformity 12/01/2020   Iron deficiency anemia 03/24/2020   Clavi  02/19/2020   Lumbar radiculopathy 06/17/2019   Pain due to onychomycosis of toenails of both feet 05/14/2019   Mixed incontinence 03/10/2019   Cervical spondylosis 06/07/2018   DDD (degenerative disc disease), cervical 05/20/2018   History of total knee replacement, left 04/13/2018   History of total knee replacement, right 04/13/2018   History of revision of total replacement of left hip joint 04/13/2018   Gait abnormality 09/08/2017   Chronic cystitis 07/05/2017   Dysuria 07/05/2017   Anemia 09/22/2015   h/o pericarditis    Essential hypertension    Aortic insufficiency    Posterior vitreous detachment, bilateral 06/05/2015   Left wrist pain 05/16/2015   Triquetral chip fracture, left, closed, initial encounter 05/16/2015   Primary osteoarthritis of first carpometacarpal joint of right hand 02/11/2015   Primary osteoarthritis of right wrist 02/11/2015   Arthropathy 11/25/2014   Subluxation 11/25/2014   Hip dislocation, left ([place]) 12/05/2013   Trochanteric bursitis of left hip 11/24/2012   Aneurysm of thoracic aorta (HCC)    Nonexudative age-related macular degeneration 04/22/2011   Posterior capsular opacification, right 04/22/2011   Status post intraocular lens implant 04/22/2011    Past Surgical History:  Procedure Laterality Date   APPENDECTOMY     BREAST EXCISIONAL BIOPSY Left 1992   benign   EXCISION/RELEASE BURSA HIP Left 11/24/2012   Procedure: LEFT HIP BURSECTOMY AND TENDON REPAIR ;  Surgeon: Gearlean Alf, MD;  Location: [facility];  Service: Orthopedics;  Laterality: Left;   HIP CLOSED REDUCTION Left 12/19/2012   Procedure: CLOSED MANIPULATION HIP;  Surgeon: Mauri Pole, MD;  Location: [facility];  Service: Orthopedics;  Laterality: Left;   HIP CLOSED REDUCTION Left 12/05/2013   Procedure: CLOSED MANIPULATION HIP;  Surgeon: Augustin Schooling, MD;  Location: [facility];  Service: Orthopedics;  Laterality: Left;   HIP CLOSED REDUCTION Left 09/22/2015   Procedure: CLOSED  REDUCTION HIP;  Surgeon: Melina Schools, MD;  Location: [facility];  Service: Orthopedics;  Laterality: Left;   JOINT REPLACEMENT     LEFT TOTAL HIP REPLACEMENT AND REVISIONS X 2   OOPHORECTOMY     has a partial of one ovary remaingin   PELVIC LAPAROSCOPY     ovarian cyst removal,    REVISION TOTAL HIP ARTHROPLASTY     left   right Achilles Tendon repair  5/13   TOTAL KNEE ARTHROPLASTY     right   TOTAL KNEE ARTHROPLASTY     left   VAGINAL HYSTERECTOMY     with ovarian cyst removal    OB History     Gravida  2   Para  2   Term      Preterm      AB      Living  2      SAB      IAB      Ectopic      Multiple      Live Births               Home Medications    Prior to Admission medications   Medication Sig Start Date End Date Taking? Authorizing Provider  acetic acid-hydrocortisone (VOSOL-HC) OTIC solution  02/26/20   [provider]  amoxicillin (AMOXIL) 500 MG capsule SMARTSIG:4 Capsule(s) By Mouth 04/10/20   [provider]  aspirin 81 MG tablet Take 81 mg by mouth daily.    [provider]  betamethasone dipropionate 0.05 % cream Apply topically 2 (two) times daily. Applied to the right ear canal twice daily for 4 to 5 days or until itching resolves. 12/22/20   Rozetta Nunnery, MD  budesonide (ENTOCORT EC) 3 MG 24 hr capsule Take 9 mg by mouth daily. 03/28/20   [provider]  Calcium Carbonate-Vit D-Min (CALCIUM 1200 PO) Take 1 tablet by mouth daily.     [provider]  carboxymethylcellulose (REFRESH PLUS) 0.5 % SOLN Place 1 drop into both eyes daily.    [provider]  carvedilol (COREG) 12.5 MG tablet Take 12.5 mg by mouth. 1/2 in the Am; 1 in the PM    [provider]  cholecalciferol (VITAMIN D) 1000 UNITS tablet Take 1,000 Units by mouth daily.    [provider]  clobetasol cream (TEMOVATE) 9.39 % Apply 1 application topically 2 (two) times daily as needed (itching).   08/08/13   [provider]  diltiazem (CARDIZEM CD) 240 MG 24 hr capsule Take 1 capsule (240 mg total) by mouth daily. 12/26/19   Belva Crome, MD  estradiol (ESTRACE) 0.5 MG tablet Take 1 tablet (0.5 mg total) by mouth daily. 06/19/20   Princess Bruins, MD  ferrous sulfate 325 (65 FE) MG tablet Take 325 mg by mouth daily. 01/24/20   [provider]  hydrochlorothiazide (MICROZIDE) 12.5 MG capsule Take 1 capsule (12.5 mg total) by mouth every Monday, Wednesday, and Friday. 08/04/20  Belva Crome, MD  meloxicam (MOBIC) 7.5 MG tablet Take 7.5 mg by mouth daily as needed for pain.  03/21/15   [provider]  omega-3 acid ethyl esters (LOVAZA) 1 g capsule Take 1 g by mouth daily.     [provider]  omeprazole (PRILOSEC) 40 MG capsule Take 40 mg by mouth daily. 05/03/20   [provider]  oxybutynin (DITROPAN) 5 MG tablet Take by mouth. 01/31/20   [provider]  sodium chloride 1 g tablet Take 1 g by mouth daily. 08/21/19   [provider]  tretinoin (RETIN-A) 0.1 % cream SMARTSIG:Sparingly Topical Every Night PRN 04/29/20   [provider]    Family History Family History  Problem Relation Age of Onset   Hypertension Mother    Stroke Mother    Heart attack Father 66   ALS Other    Breast cancer Neg Hx     Social History Social History   Tobacco Use   Smoking status: Former    Types: Cigarettes   Smokeless tobacco: Never   Tobacco comments:    quit age 48  Vaping Use   Vaping Use: Never used  Substance Use Topics   Alcohol use: No    Alcohol/week: 0.0 standard drinks   Drug use: No     Allergies   Cephalexin, Shellfish allergy, Shellfish-derived products, Clindamycin/lincomycin, Lincomycin, Other, Penicillamine, Sugar-protein-starch, Tape, Ciprofloxacin, Sulfa antibiotics, and Sulfites   Review of Systems Review of Systems  Musculoskeletal:  Positive for arthralgias and joint swelling.  All other  systems reviewed and are negative.   Physical Exam Triage Vital Signs ED Triage Vitals  Enc Vitals Group     BP 01/05/21 0822 (!) 157/66     Pulse Rate 01/05/21 0822 66     Resp 01/05/21 0822 18     Temp 01/05/21 0822 98.3 F (36.8 C)     Temp Source 01/05/21 0822 Oral     SpO2 01/05/21 0822 99 %     Weight --      Height --      Head Circumference --      Peak Flow --      Pain Score 01/05/21 0821 8     Pain Loc --      Pain Edu? --      Excl. in [place]? --    No data found.  Updated Vital Signs BP (!) 157/66 (BP Location: Right Arm)   Pulse 66   Temp 98.3 F (36.8 C) (Oral)   Resp 18   SpO2 99%   Visual Acuity Right Eye Distance:   Left Eye Distance:   Bilateral Distance:    Right Eye Near:   Left Eye Near:    Bilateral Near:     Physical Exam Vitals and nursing note reviewed.  Constitutional:      General: She is not in acute distress.    Appearance: Normal appearance. She is not ill-appearing, toxic-appearing or diaphoretic.  HENT:     Head: Normocephalic and atraumatic.  Eyes:     Conjunctiva/sclera: Conjunctivae normal.  Cardiovascular:     Rate and Rhythm: Normal rate.     Pulses: Normal pulses.  Pulmonary:     Effort: Pulmonary effort is normal.  Abdominal:     General: Abdomen is flat.  Musculoskeletal:     Cervical back: Normal range of motion.     Left ankle: Tenderness present. Decreased range of motion.     Left Achilles  Tendon: Normal.     Right foot: Normal.     Left foot: Decreased range of motion. Normal capillary refill. Tenderness and bony tenderness present. Normal pulse.  Skin:    General: Skin is warm and dry.  Neurological:     General: No focal deficit present.     Mental Status: She is alert and oriented to person, place, and time.  Psychiatric:        Mood and Affect: Mood normal.     UC Treatments / Results  Labs (all labs ordered are listed, but only abnormal results are displayed) Labs Reviewed - No data to  display  EKG   Radiology DG Ankle Complete Left  Result Date: 01/05/2021 CLINICAL DATA:  87 year old female with ankle pain and swelling for 3 months. No known injury. EXAM: LEFT ANKLE COMPLETE - 3+ VIEW COMPARISON:  None. FINDINGS: Bone mineralization is within normal limits for age. Mortise joint alignment maintained. Talar dome intact. No joint effusion is evident. No acute osseous abnormality identified. There appears to be corticated, chronic ossific fragment adjacent to the lateral malleolus. Joint spaces are normal for age. Generalized soft tissue swelling and stranding in the calf. IMPRESSION: No acute osseous abnormality identified about the left ankle. Electronically Signed   By: Genevie Ann M.D.   On: 01/05/2021 08:59   DG Foot Complete Left  Result Date: 01/05/2021 CLINICAL DATA:  Pain EXAM: LEFT FOOT - COMPLETE 3+ VIEW COMPARISON:  None. FINDINGS: There is irregularity of the lateral malleolus which may reflect sequela of prior trauma. There is no evidence of acute fracture or dislocation. Ankle alignment is maintained. There is mild bimalleolar soft tissue swelling IMPRESSION: 1. No acute fracture or dislocation. 2. Mild bimalleolar soft tissue swelling. 3. Irregularity of the lateral malleolus may reflect sequela of prior trauma. Electronically Signed   By: Valetta Mole M.D.   On: 01/05/2021 08:55    Procedures Procedures (including critical care time)  Medications Ordered in UC Medications - No data to display  Initial Impression / Assessment and Plan / UC Course  I have reviewed the triage vital signs and the nursing notes.  Pertinent labs & imaging results that were available during my care of the patient were reviewed by me and considered in my medical decision making (see chart for details).    Assessment negative for red flags or concerns.  Foot x-ray shows no acute bony abnormality but does have some irregularity of the lateral malleolus possibly reflecting sequela of  prior trauma.  Ankle x-ray with no acute bony abnormality.  Recommend taking meloxicam as previously prescribed.  Encouraged rest.  May use heat, ice, or OTC pain relievers.  Follow-up with primary care and orthopedics for reevaluation as soon as possible. Final Clinical Impressions(s) / UC Diagnoses   Final diagnoses:  Pain of joint of left ankle and foot     Discharge Instructions      Take the meloxicam daily as previously prescribed.   You can also take Tylenol as needed for pain relief and fever reduction.  You can use heat, ice, or alternate between heat and ice for comfort.  You can use IcyHot, lidocaine patches, biofreeze, aspercreme, or voltaren gel as needed for pain relief.   Follow up with your primary care provider and orthopedic doctor for re-evaluation as soon as possible.      ED Prescriptions   None    PDMP not reviewed this encounter.   Pearson Forster, NP 01/05/21 [phone]

## 2021-01-05 NOTE — ED Notes (Signed)
Patient transported to CT 

## 2021-01-05 NOTE — ED Provider Notes (Signed)
[facility] Provider Note   CSN: [id number] Arrival date & time: 01/05/21  1247     History Chief Complaint  Patient presents with   [name] is a 87 y.o. female who presents the emergency department for evaluation of ankle pain and a fall.  Patient was seen this morning at urgent care for ankle pain and ultimately diagnosed with arthritis versus a rheumatoid arthritis flare.  While leaving urgent care, the patient fell in the hallway, landing on her left knee, shoulder and aching her head on the ground.  Denies loss of consciousness, nausea, vomiting, altered mental status or vision loss.  She arrives complaining of persistent pain in the left ankle that she describes as "spasming".  Denies chest pain, shortness of breath, Donnell pain, nausea, vomiting or other systemic or traumatic complaints.   Fall Pertinent negatives include no chest pain, no abdominal pain and no shortness of breath.      Past Medical History:  Diagnosis Date   Anemia    CHRONIC   Aortic insufficiency    a. 2D echo 08/29/15: EF 55-60%, mild LVH, diastolic dysfunction, elevated LV filling pressure, mild AI, severe LAE, mild RAE, mild TR, dilated descending thoracic aorta just distal to the takeoff of the left subclavian, measuring 4.1 cm, no pericardial effusion.   Arthritis    OA-SOME BACK AND NECK PAIN,  GOES TO CHIROPRACTOR TWICE A MONTH;  HX OF JOINT REPLACEMENTS    Cancer ([place])    SKIN CANCERS REMOVED FROM LEGS   CKD (chronic kidney disease), stage II    Essential hypertension    Gait abnormality 09/08/2017   GERD (gastroesophageal reflux disease)    PSVT (paroxysmal supraventricular tachycardia) (HCC)    Recurrent Pericarditis    RECURRENT    Thoracic aneurysm    a. Followed by [facility] -last seen in 12/2013 w/ plan for f/u MRA and thoracic surgery f/u in 2 yrs, but patient does not wish to follow any longer.    Patient Active Problem List   Diagnosis  Date Noted   Pincer nail deformity 12/01/2020   Iron deficiency anemia 03/24/2020   Clavi 02/19/2020   Lumbar radiculopathy 06/17/2019   Pain due to onychomycosis of toenails of both feet 05/14/2019   Mixed incontinence 03/10/2019   Cervical spondylosis 06/07/2018   DDD (degenerative disc disease), cervical 05/20/2018   History of total knee replacement, left 04/13/2018   History of total knee replacement, right 04/13/2018   History of revision of total replacement of left hip joint 04/13/2018   Gait abnormality 09/08/2017   Chronic cystitis 07/05/2017   Dysuria 07/05/2017   Anemia 09/22/2015   h/o pericarditis    Essential hypertension    Aortic insufficiency    Posterior vitreous detachment, bilateral 06/05/2015   Left wrist pain 05/16/2015   Triquetral chip fracture, left, closed, initial encounter 05/16/2015   Primary osteoarthritis of first carpometacarpal joint of right hand 02/11/2015   Primary osteoarthritis of right wrist 02/11/2015   Arthropathy 11/25/2014   Subluxation 11/25/2014   Hip dislocation, left ([place]) 12/05/2013   Trochanteric bursitis of left hip 11/24/2012   Aneurysm of thoracic aorta (HCC)    Nonexudative age-related macular degeneration 04/22/2011   Posterior capsular opacification, right 04/22/2011   Status post intraocular lens implant 04/22/2011    Past Surgical History:  Procedure Laterality Date   APPENDECTOMY     BREAST EXCISIONAL BIOPSY Left 1992   benign   EXCISION/RELEASE BURSA HIP Left  11/24/2012   Procedure: LEFT HIP BURSECTOMY AND TENDON REPAIR ;  Surgeon: Gearlean Alf, MD;  Location: [facility];  Service: Orthopedics;  Laterality: Left;   HIP CLOSED REDUCTION Left 12/19/2012   Procedure: CLOSED MANIPULATION HIP;  Surgeon: Mauri Pole, MD;  Location: [facility];  Service: Orthopedics;  Laterality: Left;   HIP CLOSED REDUCTION Left 12/05/2013   Procedure: CLOSED MANIPULATION HIP;  Surgeon: Augustin Schooling, MD;  Location: [facility];  Service:  Orthopedics;  Laterality: Left;   HIP CLOSED REDUCTION Left 09/22/2015   Procedure: CLOSED REDUCTION HIP;  Surgeon: Melina Schools, MD;  Location: [facility];  Service: Orthopedics;  Laterality: Left;   JOINT REPLACEMENT     LEFT TOTAL HIP REPLACEMENT AND REVISIONS X 2   OOPHORECTOMY     has a partial of one ovary remaingin   PELVIC LAPAROSCOPY     ovarian cyst removal,    REVISION TOTAL HIP ARTHROPLASTY     left   right Achilles Tendon repair  5/13   TOTAL KNEE ARTHROPLASTY     right   TOTAL KNEE ARTHROPLASTY     left   VAGINAL HYSTERECTOMY     with ovarian cyst removal     OB History     Gravida  2   Para  2   Term      Preterm      AB      Living  2      SAB      IAB      Ectopic      Multiple      Live Births              Family History  Problem Relation Age of Onset   Hypertension Mother    Stroke Mother    Heart attack Father 35   ALS Other    Breast cancer Neg Hx     Social History   Tobacco Use   Smoking status: Former    Types: Cigarettes   Smokeless tobacco: Never   Tobacco comments:    quit age 55  Vaping Use   Vaping Use: Never used  Substance Use Topics   Alcohol use: No    Alcohol/week: 0.0 standard drinks   Drug use: No    Home Medications Prior to Admission medications   Medication Sig Start Date End Date Taking? Authorizing Provider  acetic acid-hydrocortisone (VOSOL-HC) OTIC solution  02/26/20   [provider]  amoxicillin (AMOXIL) 500 MG capsule SMARTSIG:4 Capsule(s) By Mouth 04/10/20   [provider]  aspirin 81 MG tablet Take 81 mg by mouth daily.    [provider]  betamethasone dipropionate 0.05 % cream Apply topically 2 (two) times daily. Applied to the right ear canal twice daily for 4 to 5 days or until itching resolves. 12/22/20   Rozetta Nunnery, MD  budesonide (ENTOCORT EC) 3 MG 24 hr capsule Take 9 mg by mouth daily. 03/28/20   [provider]  Calcium Carbonate-Vit  D-Min (CALCIUM 1200 PO) Take 1 tablet by mouth daily.     [provider]  carboxymethylcellulose (REFRESH PLUS) 0.5 % SOLN Place 1 drop into both eyes daily.    [provider]  carvedilol (COREG) 12.5 MG tablet Take 12.5 mg by mouth. 1/2 in the Am; 1 in the PM    [provider]  cholecalciferol (VITAMIN D) 1000 UNITS tablet Take 1,000 Units by mouth daily.    [provider]  clobetasol cream (TEMOVATE) 1.24 % Apply 1 application topically 2 (two) times daily as needed (itching).  08/08/13   [provider]  diltiazem (CARDIZEM CD) 240 MG 24 hr capsule Take 1 capsule (240 mg total) by mouth daily. 12/26/19   Belva Crome, MD  estradiol (ESTRACE) 0.5 MG tablet Take 1 tablet (0.5 mg total) by mouth daily. 06/19/20   Princess Bruins, MD  ferrous sulfate 325 (65 FE) MG tablet Take 325 mg by mouth daily. 01/24/20   [provider]  hydrochlorothiazide (MICROZIDE) 12.5 MG capsule Take 1 capsule (12.5 mg total) by mouth every Monday, Wednesday, and Friday. 08/04/20   Belva Crome, MD  meloxicam (MOBIC) 7.5 MG tablet Take 7.5 mg by mouth daily as needed for pain.  03/21/15   [provider]  omega-3 acid ethyl esters (LOVAZA) 1 g capsule Take 1 g by mouth daily.     [provider]  omeprazole (PRILOSEC) 40 MG capsule Take 40 mg by mouth daily. 05/03/20   [provider]  oxybutynin (DITROPAN) 5 MG tablet Take by mouth. 01/31/20   [provider]  sodium chloride 1 g tablet Take 1 g by mouth daily. 08/21/19   [provider]  tretinoin (RETIN-A) 0.1 % cream SMARTSIG:Sparingly Topical Every Night PRN 04/29/20   [provider]    Allergies    Cephalexin, Shellfish allergy, Shellfish-derived products, Clindamycin/lincomycin, Lincomycin, Other, Penicillamine, Sugar-protein-starch, Tape, Ciprofloxacin, Sulfa antibiotics, and Sulfites  Review of Systems   Review of Systems  Constitutional:  Negative  for chills and fever.  HENT:  Negative for ear pain and sore throat.   Eyes:  Negative for pain and visual disturbance.  Respiratory:  Negative for cough and shortness of breath.   Cardiovascular:  Negative for chest pain and palpitations.  Gastrointestinal:  Negative for abdominal pain and vomiting.  Genitourinary:  Negative for dysuria and hematuria.  Musculoskeletal:  Positive for arthralgias. Negative for back pain.  Skin:  Positive for wound. Negative for color change and rash.  Neurological:  Negative for seizures and syncope.  All other systems reviewed and are negative.  Physical Exam Updated Vital Signs BP (!) 183/76   Pulse 77   Temp 99 F (37.2 C) (Oral)   Resp 16   Ht 5\' 4"  (1.626 m)   Wt 67.1 kg   SpO2 98%   BMI 25.40 kg/m   Physical Exam Vitals and nursing note reviewed.  Constitutional:      General: She is not in acute distress.    Appearance: She is well-developed.  HENT:     Head: Normocephalic and atraumatic.     Comments: 5.5 cm forehead laceration over the left eyebrow Eyes:     Conjunctiva/sclera: Conjunctivae normal.  Cardiovascular:     Rate and Rhythm: Normal rate and regular rhythm.     Heart sounds: No murmur heard. Pulmonary:     Effort: Pulmonary effort is normal. No respiratory distress.     Breath sounds: Normal breath sounds.  Abdominal:     Palpations: Abdomen is soft.     Tenderness: There is no abdominal tenderness.  Musculoskeletal:        General: Swelling and tenderness (Left ankle, left knee, left shoulder) present.     Cervical back: Neck supple.  Skin:    General: Skin is warm and dry.  Neurological:     Mental Status: She is alert.    ED Results / Procedures / Treatments   Labs (all labs  ordered are listed, but only abnormal results are displayed) Labs Reviewed - No data to display  EKG None  Radiology DG Knee 2 Views Left  Result Date: 01/05/2021 CLINICAL DATA:  Left knee pain after fall. EXAM: LEFT KNEE - 1-2  VIEW COMPARISON:  November 20, 2014. FINDINGS: Status post left total knee arthroplasty. No fracture or dislocation is noted. Soft tissue swelling is noted anterior to the proximal tibia concerning for traumatic injury. IMPRESSION: No fracture or dislocation is noted. Electronically Signed   By: Marijo Conception M.D.   On: 01/05/2021 14:15   DG Ankle 2 Views Left  Result Date: 01/05/2021 CLINICAL DATA:  Left ankle pain after fall. EXAM: LEFT ANKLE - 2 VIEW COMPARISON:  None. FINDINGS: There is no evidence of fracture, dislocation, or joint effusion. There is no evidence of arthropathy or other focal bone abnormality. Soft tissues are unremarkable. IMPRESSION: Negative. Electronically Signed   By: Marijo Conception M.D.   On: 01/05/2021 14:17   DG Ankle Complete Left  Result Date: 01/05/2021 CLINICAL DATA:  87 year old female with ankle pain and swelling for 3 months. No known injury. EXAM: LEFT ANKLE COMPLETE - 3+ VIEW COMPARISON:  None. FINDINGS: Bone mineralization is within normal limits for age. Mortise joint alignment maintained. Talar dome intact. No joint effusion is evident. No acute osseous abnormality identified. There appears to be corticated, chronic ossific fragment adjacent to the lateral malleolus. Joint spaces are normal for age. Generalized soft tissue swelling and stranding in the calf. IMPRESSION: No acute osseous abnormality identified about the left ankle. Electronically Signed   By: Genevie Ann M.D.   On: 01/05/2021 08:59   CT Head Wo Contrast  Result Date: 01/05/2021 CLINICAL DATA:  Fall, pain EXAM: CT HEAD WITHOUT CONTRAST CT CERVICAL SPINE WITHOUT CONTRAST TECHNIQUE: Multidetector CT imaging of the head and cervical spine was performed following the standard protocol without intravenous contrast. Multiplanar CT image reconstructions of the cervical spine were also generated. COMPARISON:  None. FINDINGS: CT HEAD FINDINGS Brain: No evidence of acute infarction, hemorrhage,  hydrocephalus, extra-axial collection or mass lesion/mass effect. Periventricular and deep white matter hypodensity. Mild global cerebral volume loss. Vascular: No hyperdense vessel or unexpected calcification. Skull: Normal. Negative for fracture or focal lesion. Sinuses/Orbits: No acute finding. Other: Large hematoma and laceration of the left forehead (series 2, image 17) CT CERVICAL SPINE FINDINGS Alignment: Degenerative straightening and reversal of the normal cervical lordosis. Skull base and vertebrae: No acute fracture. No primary bone lesion or focal pathologic process. Soft tissues and spinal canal: No prevertebral fluid or swelling. No visible canal hematoma. Disc levels: Moderate to severe multilevel disc degenerative disease and osteophytosis. Upper chest: Negative. Other: None. IMPRESSION: 1. No acute intracranial pathology. Small-vessel white matter disease and cerebral volume loss. 2. Large hematoma and laceration of the left forehead. 3. No fracture or static subluxation of the cervical spine. 4. Moderate to severe multilevel cervical disc degenerative. Electronically Signed   By: Delanna Ahmadi M.D.   On: 01/05/2021 14:18   CT Cervical Spine Wo Contrast  Result Date: 01/05/2021 CLINICAL DATA:  Fall, pain EXAM: CT HEAD WITHOUT CONTRAST CT CERVICAL SPINE WITHOUT CONTRAST TECHNIQUE: Multidetector CT imaging of the head and cervical spine was performed following the standard protocol without intravenous contrast. Multiplanar CT image reconstructions of the cervical spine were also generated. COMPARISON:  None. FINDINGS: CT HEAD FINDINGS Brain: No evidence of acute infarction, hemorrhage, hydrocephalus, extra-axial collection or mass lesion/mass effect. Periventricular and deep white matter  hypodensity. Mild global cerebral volume loss. Vascular: No hyperdense vessel or unexpected calcification. Skull: Normal. Negative for fracture or focal lesion. Sinuses/Orbits: No acute finding. Other: Large  hematoma and laceration of the left forehead (series 2, image 17) CT CERVICAL SPINE FINDINGS Alignment: Degenerative straightening and reversal of the normal cervical lordosis. Skull base and vertebrae: No acute fracture. No primary bone lesion or focal pathologic process. Soft tissues and spinal canal: No prevertebral fluid or swelling. No visible canal hematoma. Disc levels: Moderate to severe multilevel disc degenerative disease and osteophytosis. Upper chest: Negative. Other: None. IMPRESSION: 1. No acute intracranial pathology. Small-vessel white matter disease and cerebral volume loss. 2. Large hematoma and laceration of the left forehead. 3. No fracture or static subluxation of the cervical spine. 4. Moderate to severe multilevel cervical disc degenerative. Electronically Signed   By: Delanna Ahmadi M.D.   On: 01/05/2021 14:18   DG Shoulder Left  Result Date: 01/05/2021 CLINICAL DATA:  Left shoulder pain after fall. EXAM: LEFT SHOULDER - 2+ VIEW COMPARISON:  None. FINDINGS: There is no evidence of fracture or dislocation. There is no evidence of arthropathy or other focal bone abnormality. Soft tissues are unremarkable. IMPRESSION: Negative. Electronically Signed   By: Marijo Conception M.D.   On: 01/05/2021 14:13   DG Foot Complete Left  Result Date: 01/05/2021 CLINICAL DATA:  Pain EXAM: LEFT FOOT - COMPLETE 3+ VIEW COMPARISON:  None. FINDINGS: There is irregularity of the lateral malleolus which may reflect sequela of prior trauma. There is no evidence of acute fracture or dislocation. Ankle alignment is maintained. There is mild bimalleolar soft tissue swelling IMPRESSION: 1. No acute fracture or dislocation. 2. Mild bimalleolar soft tissue swelling. 3. Irregularity of the lateral malleolus may reflect sequela of prior trauma. Electronically Signed   By: Valetta Mole M.D.   On: 01/05/2021 08:55    Procedures .Marland KitchenLaceration Repair  Date/Time: 01/05/2021 4:32 PM Performed by: Teressa Lower,  MD Authorized by: Teressa Lower, MD   Laceration details:    Location:  Face   Face location:  Forehead   Length (cm):  5.5 Pre-procedure details:    Preparation:  Patient was prepped and draped in usual sterile fashion Exploration:    Hemostasis achieved with:  Direct pressure   Imaging outcome: foreign body not noted     Wound exploration: entire depth of wound visualized     Contaminated: no   Treatment:    Area cleansed with:  Saline   Amount of cleaning:  Standard   Irrigation solution:  Sterile saline   Irrigation volume:  500cc   Irrigation method:  Pressure wash   Debridement:  Minimal Skin repair:    Repair method:  Sutures   Suture size:  6-0   Suture material:  Prolene   Suture technique:  Simple interrupted   Number of sutures:  6 Approximation:    Approximation:  Close Repair type:    Repair type:  Simple Post-procedure details:    Dressing:  Antibiotic ointment and sterile dressing   Procedure completion:  Tolerated well, no immediate complications   Medications Ordered in ED Medications  lidocaine (PF) (XYLOCAINE) 1 % injection 5 mL (has no administration in time range)  oxyCODONE-acetaminophen (PERCOCET/ROXICET) 5-325 MG per tablet 1 tablet (1 tablet Oral Given 01/05/21 1453)    ED Course  I have reviewed the triage vital signs and the nursing notes.  Pertinent labs & imaging results that were available during my care of the patient were reviewed  by me and considered in my medical decision making (see chart for details).    MDM Rules/Calculators/A&P                           Patient seen in the emergency department for evaluation of ankle pain and a facial laceration after a fall.  Physical exam reveals tenderness over the left shoulder, left knee and left ankle as well as a 5.5 cm laceration over the with a noticeable hematoma in the wound.  X-rays of the shoulder, knee and ankle are reassuringly unremarkable.  CT head and C-spine reveals a  hematoma but no intracranial hemorrhage or fracture.  Patient's laceration was repaired at bedside.  I initially attempted to use absorbable sutures and placed 3 absorbable sutures, but the wound was under tension and I had to switch to absorbable Prolene sutures to close the wound around the hematoma.  6 Prolene sutures placed.  Patient then discharged with PCP and foot and ankle follow-up in 4 days. Final Clinical Impression(s) / ED Diagnoses Final diagnoses:  None    Rx / DC Orders ED Discharge Orders     None        Marque Rademaker, Debe Coder, MD 01/05/21 [phone]

## 2021-01-06 DIAGNOSIS — I10 Essential (primary) hypertension: Secondary | ICD-10-CM | POA: Diagnosis not present

## 2021-01-06 DIAGNOSIS — Z743 Need for continuous supervision: Secondary | ICD-10-CM | POA: Diagnosis not present

## 2021-01-06 DIAGNOSIS — W19XXXA Unspecified fall, initial encounter: Secondary | ICD-10-CM | POA: Diagnosis not present

## 2021-01-07 DIAGNOSIS — Z20828 Contact with and (suspected) exposure to other viral communicable diseases: Secondary | ICD-10-CM | POA: Diagnosis not present

## 2021-01-12 DIAGNOSIS — Z20828 Contact with and (suspected) exposure to other viral communicable diseases: Secondary | ICD-10-CM | POA: Diagnosis not present

## 2021-01-13 DIAGNOSIS — D692 Other nonthrombocytopenic purpura: Secondary | ICD-10-CM | POA: Diagnosis not present

## 2021-01-13 DIAGNOSIS — M2550 Pain in unspecified joint: Secondary | ICD-10-CM | POA: Diagnosis not present

## 2021-01-13 DIAGNOSIS — Z85828 Personal history of other malignant neoplasm of skin: Secondary | ICD-10-CM | POA: Diagnosis not present

## 2021-01-13 DIAGNOSIS — S0101XD Laceration without foreign body of scalp, subsequent encounter: Secondary | ICD-10-CM | POA: Diagnosis not present

## 2021-01-13 DIAGNOSIS — W19XXXD Unspecified fall, subsequent encounter: Secondary | ICD-10-CM | POA: Diagnosis not present

## 2021-01-14 ENCOUNTER — Other Ambulatory Visit: Payer: Self-pay

## 2021-01-14 ENCOUNTER — Other Ambulatory Visit: Payer: Self-pay | Admitting: *Deleted

## 2021-01-14 MED ORDER — ESTRADIOL 0.5 MG PO TABS: 0.5000 mg | ORAL_TABLET | Freq: Every day | ORAL | 0 refills | Status: DC

## 2021-01-14 MED ORDER — CARVEDILOL 12.5 MG PO TABS: 12.5000 mg | ORAL_TABLET | Freq: Two times a day (BID) | ORAL | 1 refills | Status: DC

## 2021-01-14 NOTE — Telephone Encounter (Signed)
PAP and pelvic scheduled 02/25/21 w Dr. Marguerita Merles. Last AEX 12/12/19 with Dr. Allena Napoleon

## 2021-01-15 DIAGNOSIS — Z4802 Encounter for removal of sutures: Secondary | ICD-10-CM | POA: Diagnosis not present

## 2021-01-16 ENCOUNTER — Emergency Department (HOSPITAL_COMMUNITY)
Admission: EM | Admit: 2021-01-16 | Discharge: 2021-01-19 | Disposition: A | Discharge status: Other Acute Inpatient Hospital | Payer: Medicare Other | Attending: Emergency Medicine | Admitting: Emergency Medicine

## 2021-01-16 ENCOUNTER — Encounter (HOSPITAL_COMMUNITY): Payer: Self-pay | Admitting: Emergency Medicine

## 2021-01-16 ENCOUNTER — Emergency Department (HOSPITAL_COMMUNITY): Payer: Medicare Other

## 2021-01-16 ENCOUNTER — Other Ambulatory Visit: Payer: Self-pay | Admitting: *Deleted

## 2021-01-16 DIAGNOSIS — Z96653 Presence of artificial knee joint, bilateral: Secondary | ICD-10-CM | POA: Insufficient documentation

## 2021-01-16 DIAGNOSIS — T1491XA Suicide attempt, initial encounter: Secondary | ICD-10-CM

## 2021-01-16 DIAGNOSIS — Z7982 Long term (current) use of aspirin: Secondary | ICD-10-CM | POA: Diagnosis not present

## 2021-01-16 DIAGNOSIS — Z79899 Other long term (current) drug therapy: Secondary | ICD-10-CM | POA: Insufficient documentation

## 2021-01-16 DIAGNOSIS — Z96642 Presence of left artificial hip joint: Secondary | ICD-10-CM | POA: Insufficient documentation

## 2021-01-16 DIAGNOSIS — E871 Hypo-osmolality and hyponatremia: Secondary | ICD-10-CM | POA: Diagnosis not present

## 2021-01-16 DIAGNOSIS — F319 Bipolar disorder, unspecified: Secondary | ICD-10-CM | POA: Insufficient documentation

## 2021-01-16 DIAGNOSIS — Z20822 Contact with and (suspected) exposure to covid-19: Secondary | ICD-10-CM | POA: Insufficient documentation

## 2021-01-16 DIAGNOSIS — I129 Hypertensive chronic kidney disease with stage 1 through stage 4 chronic kidney disease, or unspecified chronic kidney disease: Secondary | ICD-10-CM | POA: Diagnosis not present

## 2021-01-16 DIAGNOSIS — X838XXA Intentional self-harm by other specified means, initial encounter: Secondary | ICD-10-CM | POA: Insufficient documentation

## 2021-01-16 DIAGNOSIS — S0990XA Unspecified injury of head, initial encounter: Secondary | ICD-10-CM | POA: Diagnosis present

## 2021-01-16 DIAGNOSIS — S0083XA Contusion of other part of head, initial encounter: Secondary | ICD-10-CM | POA: Diagnosis not present

## 2021-01-16 DIAGNOSIS — D72829 Elevated white blood cell count, unspecified: Secondary | ICD-10-CM | POA: Diagnosis not present

## 2021-01-16 DIAGNOSIS — J189 Pneumonia, unspecified organism: Secondary | ICD-10-CM | POA: Diagnosis not present

## 2021-01-16 DIAGNOSIS — Z85828 Personal history of other malignant neoplasm of skin: Secondary | ICD-10-CM | POA: Diagnosis not present

## 2021-01-16 DIAGNOSIS — Z87891 Personal history of nicotine dependence: Secondary | ICD-10-CM | POA: Diagnosis not present

## 2021-01-16 DIAGNOSIS — F29 Unspecified psychosis not due to a substance or known physiological condition: Secondary | ICD-10-CM | POA: Diagnosis not present

## 2021-01-16 DIAGNOSIS — R030 Elevated blood-pressure reading, without diagnosis of hypertension: Secondary | ICD-10-CM | POA: Insufficient documentation

## 2021-01-16 DIAGNOSIS — R45851 Suicidal ideations: Secondary | ICD-10-CM | POA: Diagnosis not present

## 2021-01-16 DIAGNOSIS — N182 Chronic kidney disease, stage 2 (mild): Secondary | ICD-10-CM | POA: Insufficient documentation

## 2021-01-16 LAB — COMPREHENSIVE METABOLIC PANEL
ALT: 26 U/L (ref 0–44)
AST: 28 U/L (ref 15–41)
Albumin: 3.4 g/dL — ABNORMAL LOW (ref 3.5–5.0)
Alkaline Phosphatase: 49 U/L (ref 38–126)
Anion gap: 7 (ref 5–15)
BUN: 28 mg/dL — ABNORMAL HIGH (ref 8–23)
CO2: 23 mmol/L (ref 22–32)
Calcium: 8.2 mg/dL — ABNORMAL LOW (ref 8.9–10.3)
Chloride: 96 mmol/L — ABNORMAL LOW (ref 98–111)
Creatinine, Ser: 1.07 mg/dL — ABNORMAL HIGH (ref 0.44–1.00)
GFR, Estimated: 50 mL/min — ABNORMAL LOW (ref >60)
Glucose, Bld: 115 mg/dL — ABNORMAL HIGH (ref 70–99)
Potassium: 4.6 mmol/L (ref 3.5–5.1)
Sodium: 126 mmol/L — ABNORMAL LOW (ref 135–145)
Total Bilirubin: 0.8 mg/dL (ref 0.3–1.2)
Total Protein: 6.9 g/dL (ref 6.5–8.1)

## 2021-01-16 LAB — CBC WITH DIFFERENTIAL/PLATELET
Abs Immature Granulocytes: 0 10*3/uL (ref 0.00–0.07)
Basophils Absolute: 0.1 10*3/uL (ref 0.0–0.1)
Basophils Relative: 1 %
Eosinophils Absolute: 0.3 10*3/uL (ref 0.0–0.5)
Eosinophils Relative: 2 %
HCT: 26.5 % — ABNORMAL LOW (ref 36.0–46.0)
Hemoglobin: 8.7 g/dL — ABNORMAL LOW (ref 12.0–15.0)
Lymphocytes Relative: 22 %
Lymphs Abs: 3 10*3/uL (ref 0.7–4.0)
MCH: 30.2 pg (ref 26.0–34.0)
MCHC: 32.8 g/dL (ref 30.0–36.0)
MCV: 92 fL (ref 80.0–100.0)
Monocytes Absolute: 0.1 10*3/uL (ref 0.1–1.0)
Monocytes Relative: 1 %
Neutro Abs: 10.2 10*3/uL — ABNORMAL HIGH (ref 1.7–7.7)
Neutrophils Relative %: 74 %
Platelets: 311 10*3/uL (ref 150–400)
RBC: 2.88 MIL/uL — ABNORMAL LOW (ref 3.87–5.11)
RDW: 14.6 % (ref 11.5–15.5)
WBC: 13.8 10*3/uL — ABNORMAL HIGH (ref 4.0–10.5)
nRBC: 0 % (ref 0.0–0.2)
nRBC: 0 /100 WBC

## 2021-01-16 LAB — T4, FREE: Free T4: 1.04 ng/dL (ref 0.61–1.12)

## 2021-01-16 LAB — RAPID URINE DRUG SCREEN, HOSP PERFORMED
Amphetamines: NOT DETECTED
Barbiturates: NOT DETECTED
Benzodiazepines: NOT DETECTED
Cocaine: NOT DETECTED
Opiates: NOT DETECTED
Tetrahydrocannabinol: NOT DETECTED

## 2021-01-16 LAB — ETHANOL: Alcohol, Ethyl (B): <10 mg/dL (ref <10)

## 2021-01-16 LAB — URINALYSIS, ROUTINE W REFLEX MICROSCOPIC
Bilirubin Urine: NEGATIVE
Glucose, UA: NEGATIVE mg/dL
Hgb urine dipstick: NEGATIVE
Ketones, ur: NEGATIVE mg/dL
Leukocytes,Ua: NEGATIVE
Nitrite: NEGATIVE
Protein, ur: NEGATIVE mg/dL
Specific Gravity, Urine: 1.01 (ref 1.005–1.030)
pH: 5 (ref 5.0–8.0)

## 2021-01-16 LAB — ACETAMINOPHEN LEVEL: Acetaminophen (Tylenol), Serum: <10 ug/mL — ABNORMAL LOW (ref 10–30)

## 2021-01-16 LAB — SALICYLATE LEVEL: Salicylate Lvl: <7 mg/dL — ABNORMAL LOW (ref 7.0–30.0)

## 2021-01-16 LAB — RESP PANEL BY RT-PCR (FLU A&B, COVID) ARPGX2
Influenza A by PCR: NEGATIVE
Influenza B by PCR: NEGATIVE
SARS Coronavirus 2 by RT PCR: NEGATIVE

## 2021-01-16 LAB — TSH: TSH: 2.213 u[IU]/mL (ref 0.350–4.500)

## 2021-01-16 MED ORDER — SODIUM CHLORIDE 1 G PO TABS
1.0000 g | ORAL_TABLET | Freq: Once | ORAL | Status: AC
  Administered 2021-01-16: 1 g via ORAL
  Filled 2021-01-16: qty 1

## 2021-01-16 MED ORDER — FERROUS SULFATE 325 (65 FE) MG PO TABS
325.0000 mg | ORAL_TABLET | Freq: Every day | ORAL | Status: DC
  Administered 2021-01-17 – 2021-01-19 (×3): 325 mg via ORAL
  Filled 2021-01-16 (×4): qty 1

## 2021-01-16 MED ORDER — CARVEDILOL 3.125 MG PO TABS
6.2500 mg | ORAL_TABLET | Freq: Every day | ORAL | Status: DC
  Administered 2021-01-17 – 2021-01-19 (×3): 6.25 mg via ORAL
  Filled 2021-01-16 (×3): qty 2

## 2021-01-16 MED ORDER — DILTIAZEM HCL ER COATED BEADS 240 MG PO CP24
240.0000 mg | ORAL_CAPSULE | Freq: Every evening | ORAL | Status: DC
  Administered 2021-01-17 – 2021-01-18 (×3): 240 mg via ORAL
  Filled 2021-01-16 (×4): qty 1

## 2021-01-16 MED ORDER — AZITHROMYCIN 250 MG PO TABS
250.0000 mg | ORAL_TABLET | Freq: Every day | ORAL | Status: DC
  Administered 2021-01-17 – 2021-01-19 (×3): 250 mg via ORAL
  Filled 2021-01-16 (×4): qty 1

## 2021-01-16 MED ORDER — MUPIROCIN 2 % EX OINT
1.0000 | TOPICAL_OINTMENT | CUTANEOUS | Status: DC
Start: 2021-01-16 — End: 2021-01-16

## 2021-01-16 MED ORDER — ASPIRIN 81 MG PO CHEW
81.0000 mg | CHEWABLE_TABLET | Freq: Every evening | ORAL | Status: DC
  Administered 2021-01-17 – 2021-01-18 (×3): 81 mg via ORAL
  Filled 2021-01-16 (×3): qty 1

## 2021-01-16 MED ORDER — CARBOXYMETHYLCELLULOSE SODIUM 0.5 % OP SOLN: 1.0000 [drp] | Freq: Two times a day (BID) | OPHTHALMIC | Status: DC | PRN

## 2021-01-16 MED ORDER — CARVEDILOL 12.5 MG PO TABS
12.5000 mg | ORAL_TABLET | ORAL | Status: DC
  Filled 2021-01-16: qty 1

## 2021-01-16 MED ORDER — POLYVINYL ALCOHOL 1.4 % OP SOLN
1.0000 [drp] | OPHTHALMIC | Status: DC | PRN
  Filled 2021-01-16: qty 15

## 2021-01-16 MED ORDER — MUPIROCIN 2 % EX OINT
1.0000 "application " | TOPICAL_OINTMENT | Freq: Every day | CUTANEOUS | Status: DC
  Administered 2021-01-17: 1 via TOPICAL
  Filled 2021-01-16 (×2): qty 22

## 2021-01-16 MED ORDER — SODIUM CHLORIDE 0.9 % IV BOLUS
1000.0000 mL | Freq: Once | INTRAVENOUS | Status: AC
  Administered 2021-01-16: 1000 mL via INTRAVENOUS

## 2021-01-16 MED ORDER — SODIUM CHLORIDE 1 G PO TABS
1.0000 g | ORAL_TABLET | Freq: Every day | ORAL | Status: DC
  Administered 2021-01-17 – 2021-01-19 (×3): 1 g via ORAL
  Filled 2021-01-16 (×3): qty 1

## 2021-01-16 MED ORDER — BUDESONIDE 3 MG PO CPEP
3.0000 mg | ORAL_CAPSULE | Freq: Every day | ORAL | Status: DC
  Administered 2021-01-17 – 2021-01-19 (×3): 3 mg via ORAL
  Filled 2021-01-16 (×3): qty 1

## 2021-01-16 MED ORDER — AZITHROMYCIN 250 MG PO TABS
500.0000 mg | ORAL_TABLET | Freq: Once | ORAL | Status: AC
  Administered 2021-01-16: 500 mg via ORAL
  Filled 2021-01-16: qty 2

## 2021-01-16 MED ORDER — ESTRADIOL 1 MG PO TABS
0.5000 mg | ORAL_TABLET | Freq: Every day | ORAL | Status: DC
  Administered 2021-01-17 – 2021-01-19 (×3): 0.5 mg via ORAL
  Filled 2021-01-16 (×3): qty 0.5

## 2021-01-16 MED ORDER — CARVEDILOL 12.5 MG PO TABS
12.5000 mg | ORAL_TABLET | Freq: Every day | ORAL | Status: DC
  Administered 2021-01-17 – 2021-01-18 (×3): 12.5 mg via ORAL
  Filled 2021-01-16 (×3): qty 1

## 2021-01-16 NOTE — ED Triage Notes (Signed)
Pt transported from senior living facility under IVC by her daughter for SI/ attempts. Pt's daughter is here from [place] and will be returning soon. Pt is non ambulatory d/t recent ankle injury. Per facility and daughter pt attempted suicide by placing plastic bag on her head, removed by her daughter.

## 2021-01-16 NOTE — ED Notes (Signed)
IVC PAPERWORK DONE AND GIVEN TO CHLOE RN

## 2021-01-16 NOTE — ED Notes (Signed)
[name] son [phone] requesting an update on the patient

## 2021-01-16 NOTE — ED Notes (Signed)
Pt refused to take her clothes off. Doc Pearline Cables made aware.

## 2021-01-16 NOTE — ED Notes (Signed)
Pt ambulated to bathroom with assistance. Expressed to pt the need of a urine sample, urine sample cup provided.

## 2021-01-16 NOTE — ED Notes (Signed)
Ambulated pt the the bathroom. Pt given burgundy scrubs to change into and instructed pt to place clothes and belongings inside of the belongings bag.

## 2021-01-16 NOTE — ED Provider Notes (Signed)
[facility] Provider Note   CSN: [id number] Arrival date & time: 01/16/21  1547     History Chief Complaint  Patient presents with  . involuntary commitment    [name] is a 87 y.o. female.  This is a 87 y.o. female with significant medical history as below, including HTN, GERD who presents to the ED with complaint of suicidal. Pt reports she had af all recently, was evaluated 10/24.  Patient has been living at an assisted care facility, she reports that since her fall her quality of life has decreased and she "wants to end it all" patient attempted to harm her self this evening placing a plastic bag over her head.  She did not feel as though it was working and she took the bag off and count of breath.  She was sent to the ER by her daughter is visiting from out of town.  Reports compliance to medications, no further falls, no nausea or vomiting, no change in bowel or bladder function, no abdominal or chest pain.  No rashes, no headaches.  No vision changes, hallucinations or delusions.  No illicit drug use, no alcohol use.  She remained suicidal, frustrated with her quality of life.  Not homicidal.  No hallucinations.  Not appear to be acutely psychotic  The history is provided by the patient. No language interpreter was used.      Past Medical History:  Diagnosis Date  . Anemia    CHRONIC  . Aortic insufficiency    a. 2D echo 08/29/15: EF 55-60%, mild LVH, diastolic dysfunction, elevated LV filling pressure, mild AI, severe LAE, mild RAE, mild TR, dilated descending thoracic aorta just distal to the takeoff of the left subclavian, measuring 4.1 cm, no pericardial effusion.  . Arthritis    OA-SOME BACK AND NECK PAIN,  GOES TO CHIROPRACTOR TWICE A MONTH;  HX OF JOINT REPLACEMENTS   . Cancer ([place])    SKIN CANCERS REMOVED FROM LEGS  . CKD (chronic kidney disease), stage II   . Essential hypertension   . Gait abnormality 09/08/2017  .  GERD (gastroesophageal reflux disease)   . PSVT (paroxysmal supraventricular tachycardia) ([place])   . Recurrent Pericarditis    RECURRENT   . Thoracic aneurysm    a. Followed by [facility] -last seen in 12/2013 w/ plan for f/u MRA and thoracic surgery f/u in 2 yrs, but patient does not wish to follow any longer.    Patient Active Problem List   Diagnosis Date Noted  . Pincer nail deformity 12/01/2020  . Iron deficiency anemia 03/24/2020  . Clavi 02/19/2020  . Lumbar radiculopathy 06/17/2019  . Pain due to onychomycosis of toenails of both feet 05/14/2019  . Mixed incontinence 03/10/2019  . Cervical spondylosis 06/07/2018  . DDD (degenerative disc disease), cervical 05/20/2018  . History of total knee replacement, left 04/13/2018  . History of total knee replacement, right 04/13/2018  . History of revision of total replacement of left hip joint 04/13/2018  . Gait abnormality 09/08/2017  . Chronic cystitis 07/05/2017  . Dysuria 07/05/2017  . Anemia 09/22/2015  . h/o pericarditis   . Essential hypertension   . Aortic insufficiency   . Posterior vitreous detachment, bilateral 06/05/2015  . Left wrist pain 05/16/2015  . Triquetral chip fracture, left, closed, initial encounter 05/16/2015  . Primary osteoarthritis of first carpometacarpal joint of right hand 02/11/2015  . Primary osteoarthritis of right wrist 02/11/2015  . Arthropathy 11/25/2014  . Subluxation 11/25/2014  .  Hip dislocation, left ([place]) 12/05/2013  . Trochanteric bursitis of left hip 11/24/2012  . Aneurysm of thoracic aorta ([place])   . Nonexudative age-related macular degeneration 04/22/2011  . Posterior capsular opacification, right 04/22/2011  . Status post intraocular lens implant 04/22/2011    Past Surgical History:  Procedure Laterality Date  . APPENDECTOMY    . BREAST EXCISIONAL BIOPSY Left 1992   benign  . EXCISION/RELEASE BURSA HIP Left 11/24/2012   Procedure: LEFT HIP BURSECTOMY AND TENDON REPAIR ;  Surgeon:  Gearlean Alf, MD;  Location: [facility];  Service: Orthopedics;  Laterality: Left;  . HIP CLOSED REDUCTION Left 12/19/2012   Procedure: CLOSED MANIPULATION HIP;  Surgeon: Mauri Pole, MD;  Location: [facility];  Service: Orthopedics;  Laterality: Left;  . HIP CLOSED REDUCTION Left 12/05/2013   Procedure: CLOSED MANIPULATION HIP;  Surgeon: Augustin Schooling, MD;  Location: [facility];  Service: Orthopedics;  Laterality: Left;  . HIP CLOSED REDUCTION Left 09/22/2015   Procedure: CLOSED REDUCTION HIP;  Surgeon: Melina Schools, MD;  Location: [facility];  Service: Orthopedics;  Laterality: Left;  . JOINT REPLACEMENT     LEFT TOTAL HIP REPLACEMENT AND REVISIONS X 2  . OOPHORECTOMY     has a partial of one ovary remaingin  . PELVIC LAPAROSCOPY     ovarian cyst removal,   . REVISION TOTAL HIP ARTHROPLASTY     left  . right Achilles Tendon repair  5/13  . TOTAL KNEE ARTHROPLASTY     right  . TOTAL KNEE ARTHROPLASTY     left  . VAGINAL HYSTERECTOMY     with ovarian cyst removal     OB History     Gravida  2   Para  2   Term      Preterm      AB      Living  2      SAB      IAB      Ectopic      Multiple      Live Births              Family History  Problem Relation Age of Onset  . Hypertension Mother   . Stroke Mother   . Heart attack Father 71  . ALS Other   . Breast cancer Neg Hx     Social History   Tobacco Use  . Smoking status: Former    Types: Cigarettes  . Smokeless tobacco: Never  . Tobacco comments:    quit age 56  Vaping Use  . Vaping Use: Never used  Substance Use Topics  . Alcohol use: No    Alcohol/week: 0.0 standard drinks  . Drug use: No    Home Medications Prior to Admission medications   Medication Sig Start Date End Date Taking? Authorizing Provider  amoxicillin (AMOXIL) 500 MG capsule Take 2,000 mg by mouth See admin instructions. 1 hour prior to dental appointment 04/10/20  Yes [provider]  aspirin 81 MG tablet Take 81 mg by  mouth every evening.   Yes [provider]  budesonide (ENTOCORT EC) 3 MG 24 hr capsule Take 3 mg by mouth daily. 03/28/20  Yes [provider]  Calcium Carbonate-Vit D-Min (CALCIUM 1200 PO) Take 1 tablet by mouth every evening.   Yes [provider]  carboxymethylcellulose (REFRESH PLUS) 0.5 % SOLN Place 1 drop into both eyes in the morning and at bedtime.   Yes [provider]  carvedilol (COREG)  12.5 MG tablet Take 1 tablet (12.5 mg total) by mouth 2 (two) times daily with a meal. 1/2 in the Am; 1 in the PM Patient taking differently: Take 12.5 mg by mouth See admin instructions. 6.25 mg in the morning. 12.5 mg in the evening 01/14/21  Yes Belva Crome, MD  cholecalciferol (VITAMIN D) 1000 UNITS tablet Take 1,000 Units by mouth daily.   Yes [provider]  clobetasol cream (TEMOVATE) 0.86 % Apply 1 application topically 2 (two) times daily as needed (itching).  08/08/13  Yes [provider]  diltiazem (CARDIZEM CD) 240 MG 24 hr capsule Take 1 capsule (240 mg total) by mouth daily. Patient taking differently: Take 240 mg by mouth every evening. 12/26/19  Yes Belva Crome, MD  estradiol (ESTRACE) 0.5 MG tablet Take 1 tablet (0.5 mg total) by mouth daily. 01/14/21  Yes Princess Bruins, MD  ferrous sulfate 325 (65 FE) MG tablet Take 325 mg by mouth daily. 01/24/20  Yes [provider]  meloxicam (MOBIC) 7.5 MG tablet Take 7.5 mg by mouth daily as needed for pain.  03/21/15  Yes [provider]  mupirocin ointment (BACTROBAN) 2 % Apply 1 application topically See admin instructions. Apply to leg after showers 01/13/21  Yes [provider]  omega-3 acid ethyl esters (LOVAZA) 1 g capsule Take 1 g by mouth daily.    Yes [provider]  oxyCODONE-acetaminophen (PERCOCET/ROXICET) 5-325 MG tablet Take 1 tablet by mouth every 6 (six) hours as needed for severe pain. 01/05/21  Yes Kommor, [place], MD  sodium chloride 1  g tablet Take 1 g by mouth daily. 08/21/19  Yes [provider]  tretinoin (RETIN-A) 0.1 % cream Apply 1 application topically at bedtime as needed (breakouts). 04/29/20  Yes [provider]  betamethasone dipropionate 0.05 % cream Apply topically 2 (two) times daily. Applied to the right ear canal twice daily for 4 to 5 days or until itching resolves. Patient not taking: No sig reported 12/22/20   Rozetta Nunnery, MD  predniSONE (STERAPRED UNI-PAK 21 TAB) 10 MG (21) TBPK tablet Take 10 mg by mouth See admin instructions. 6,5,4,3,2,1 Patient not taking: Reported on 01/16/2021 01/05/21   [provider]    Allergies    Cephalexin, Shellfish allergy, Shellfish-derived products, Clindamycin/lincomycin, Lincomycin, Other, Penicillamine, Sugar-protein-starch, Tape, Ciprofloxacin, Sulfa antibiotics, and Sulfites  Review of Systems   Review of Systems  Constitutional:  Negative for activity change and fever.  HENT:  Negative for facial swelling and trouble swallowing.   Eyes:  Negative for discharge and redness.  Respiratory:  Negative for cough and shortness of breath.   Cardiovascular:  Negative for chest pain and palpitations.  Gastrointestinal:  Negative for abdominal pain and nausea.  Genitourinary:  Negative for dysuria and flank pain.  Musculoskeletal:  Negative for back pain and gait problem.  Skin:  Negative for pallor and rash.  Neurological:  Negative for syncope and headaches.  Psychiatric/Behavioral:  Positive for suicidal ideas.    Physical Exam Updated Vital Signs BP (!) 175/83 (BP Location: Right Arm)   Pulse 76   Temp 98.6 F (37 C) (Oral)   Resp 15   SpO2 99%   Physical Exam Vitals and nursing note reviewed.  Constitutional:      General: She is not in acute distress.    Appearance: Normal appearance.  HENT:     Head: Normocephalic.     Comments: Hematoma to left superior orbit, hematoma to left side of  face    Right Ear: External ear  normal.     Left Ear: External ear normal.     Nose: Nose normal.     Mouth/Throat:     Mouth: Mucous membranes are moist.  Eyes:     General: No scleral icterus.       Right eye: No discharge.        Left eye: No discharge.     Extraocular Movements: Extraocular movements intact.     Pupils: Pupils are equal, round, and reactive to light.  Cardiovascular:     Rate and Rhythm: Normal rate and regular rhythm.     Pulses: Normal pulses.     Heart sounds: Normal heart sounds.  Pulmonary:     Effort: Pulmonary effort is normal. No respiratory distress.     Breath sounds: Normal breath sounds.  Abdominal:     General: Abdomen is flat.     Tenderness: There is no abdominal tenderness.  Musculoskeletal:        General: Normal range of motion.     Cervical back: Normal range of motion.     Right lower leg: No edema.     Left lower leg: No edema.  Skin:    General: Skin is warm and dry.     Capillary Refill: Capillary refill takes less than 2 seconds.  Neurological:     Mental Status: She is alert and oriented to person, place, and time.     GCS: GCS eye subscore is 4. GCS verbal subscore is 5. GCS motor subscore is 6.     Cranial Nerves: Cranial nerves 2-12 are intact. No dysarthria.     Sensory: Sensation is intact.     Motor: Motor function is intact. No tremor.     Coordination: Coordination is intact.  Psychiatric:        Attention and Perception: She does not perceive auditory hallucinations.        Mood and Affect: Mood normal.        Speech: Speech normal.        Behavior: Behavior normal. Behavior is cooperative.        Thought Content: Thought content is not paranoid. Thought content includes suicidal ideation. Thought content does not include homicidal ideation. Thought content includes suicidal plan. Thought content does not include homicidal plan.    ED Results / Procedures / Treatments   Labs (all labs ordered are listed, but only abnormal results are  displayed) Labs Reviewed  COMPREHENSIVE METABOLIC PANEL - Abnormal; Notable for the following components:      Result Value   Sodium 126 (*)    Chloride 96 (*)    Glucose, Bld 115 (*)    BUN 28 (*)    Creatinine, Ser 1.07 (*)    Calcium 8.2 (*)    Albumin 3.4 (*)    GFR, Estimated 50 (*)    All other components within normal limits  CBC WITH DIFFERENTIAL/PLATELET - Abnormal; Notable for the following components:   WBC 13.8 (*)    RBC 2.88 (*)    Hemoglobin 8.7 (*)    HCT 26.5 (*)    Neutro Abs 10.2 (*)    All other components within normal limits  URINALYSIS, ROUTINE W REFLEX MICROSCOPIC - Abnormal; Notable for the following components:   Color, Urine AMBER (*)    APPearance HAZY (*)    All other components within normal limits  SALICYLATE LEVEL - Abnormal; Notable for the following components:  Salicylate Lvl <8.3 (*)    All other components within normal limits  ACETAMINOPHEN LEVEL - Abnormal; Notable for the following components:   Acetaminophen (Tylenol), Serum <10 (*)    All other components within normal limits  RESP PANEL BY RT-PCR (FLU A&B, COVID) ARPGX2  ETHANOL  RAPID URINE DRUG SCREEN, HOSP PERFORMED  TSH  T4, FREE    EKG None  Radiology DG Chest Portable 1 View  Result Date: 01/16/2021 CLINICAL DATA:  Leukocytosis. EXAM: PORTABLE CHEST 1 VIEW COMPARISON:  Chest radiograph dated 11/12/2017. FINDINGS: There is diffuse interstitial coarsening. Faint bilateral subpleural haziness may be chronic. Atypical infection is not excluded clinical correlation recommended. Stable cardiac silhouette. No acute osseous pathology. IMPRESSION: Chronic interstitial coarsening with bilateral subpleural haziness. Atypical infection is not excluded. Electronically Signed   By: Anner Crete M.D.   On: 01/16/2021 20:41    Procedures Procedures   Medications Ordered in ED Medications  azithromycin (ZITHROMAX) tablet 250 mg (has no administration in time range)  aspirin  tablet 81 mg (has no administration in time range)  budesonide (ENTOCORT EC) 24 hr capsule 3 mg (has no administration in time range)  carboxymethylcellulose (REFRESH PLUS) 0.5 % ophthalmic solution 1 drop (has no administration in time range)  carvedilol (COREG) tablet 12.5 mg (has no administration in time range)  diltiazem (CARDIZEM CD) 24 hr capsule 240 mg (has no administration in time range)  ferrous sulfate tablet 325 mg (has no administration in time range)  mupirocin ointment (BACTROBAN) 2 % 1 application (has no administration in time range)  sodium chloride tablet 1 g (has no administration in time range)  estradiol (ESTRACE) tablet 0.5 mg (has no administration in time range)  sodium chloride 0.9 % bolus 1,000 mL (1,000 mLs Intravenous New Bag/Given 01/16/21 2150)  sodium chloride tablet 1 g (1 g Oral Given 01/16/21 2138)  azithromycin (ZITHROMAX) tablet 500 mg (500 mg Oral Given 01/16/21 2136)    ED Course  I have reviewed the triage vital signs and the nursing notes.  Pertinent labs & imaging results that were available during my care of the patient were reviewed by me and considered in my medical decision making (see chart for details).    MDM Rules/Calculators/A&P                           CC: IVC, si  This patient complains of above; this involves an extensive number of treatment options and is a complaint that carries with it a high risk of complications and morbidity. Vital signs were reviewed. Serious etiologies considered.  Record review:  Previous records obtained and reviewed    Work up as above, notable for:  Labs & imaging results that were available during my care of the patient were reviewed by me and considered in my medical decision making.   Hyponatremia noted on metabolic panel.  Patient with no neurodeficits, no seizure activity.  Prior sodium was normal around 5 months ago.  Patient is on HCTZ, also increasing liquid intake without much food intake.   Creatinine stable.  BUN stable.  Give IVF.  Patient with mild increase to WBC count.  Hemoglobin is also mildly reduced.  Denies black stool or blood per rectum.  No increased bruising, no thinners. Hemodynamically stable. UA neg. Patient has no systemic complaints concerning for infection. Afebrile. Will collect CXR and COVID/flu test to further differentiate.   I ordered imaging studies which included CXR and I independently  visualized and interpreted imaging which showed atypical pna  Management: Given IVF, azithromycin  Reassessment:  Pt remains suicidal, otherwise she is feeling well. Will plan for antibiotics orally for her presumed pneumonia. No difficulty breathing reported. She is hemodynamically stable. She has hyponatremia, given ivf, eating. She has experienced this in the past, variable compliance with salt tabs. Also hx iron deficiency anemia, will resume oral iron.   BP mildly elevated in ED, has not taken night time BP medications, these will be given. She has no CP or other symptoms a/w hypertensive urgency/emergency.   Medically cleared at this time, pending psychiatric evaluation.         This chart was dictated using voice recognition software.  Despite best efforts to proofread,  errors can occur which can change the documentation meaning.  Final Clinical Impression(s) / ED Diagnoses Final diagnoses:  Suicidal behavior with attempted self-injury ([place])  Atypical pneumonia  Hyponatremia  Elevated blood pressure reading    Rx / DC Orders ED Discharge Orders     None        Jeanell Sparrow, DO 01/16/21 2220

## 2021-01-16 NOTE — ED Notes (Signed)
Pt's belongings have been inventoried and placed in  locker #2.

## 2021-01-17 DIAGNOSIS — S0083XA Contusion of other part of head, initial encounter: Secondary | ICD-10-CM | POA: Diagnosis not present

## 2021-01-17 MED ORDER — DOCUSATE SODIUM 100 MG PO CAPS
100.0000 mg | ORAL_CAPSULE | Freq: Once | ORAL | Status: AC
  Administered 2021-01-17: 100 mg via ORAL
  Filled 2021-01-17: qty 1

## 2021-01-17 MED ORDER — DOCUSATE SODIUM 100 MG PO CAPS
100.0000 mg | ORAL_CAPSULE | Freq: Two times a day (BID) | ORAL | Status: DC
  Administered 2021-01-18 – 2021-01-19 (×3): 100 mg via ORAL
  Filled 2021-01-17 (×4): qty 1

## 2021-01-17 NOTE — ED Notes (Signed)
Pt ambulated self to bathroom with walker alongside this RN to wash her face and brush her teeth. RN also noted pt still wearing her sweater and t-shirt from home underneath her burgundy scrub top. This RN assisted pt in removing these personal belongings.

## 2021-01-17 NOTE — Progress Notes (Signed)
Patient meets criteria for inpatient (gero-psych) treatment per Prescott Gum NP. CSW faxed referrals to the following facilities for review:  [facility]   TTS will continue to seek bed placement.   Maxie Better, MSW, LCSW Clinical Social Worker 01/17/2021 8:41 AM

## 2021-01-17 NOTE — BH Assessment (Addendum)
Comprehensive Clinical Assessment (CCA) Note  01/17/2021 [name] [id number] Disposition: Clinician discussed patient care with Darlene Reichert, NP.  She recommended inpatient psychiatric care at a gero psych placement.  RN [name] informed of recommendation via secure messaging.    Patient is oriented and has good eye contact.  She is perseverating on the idea that things weill never be the same and that her life is over.  Pt does not respond to internal stimuli.  Patient perspective on reality is distorted and she has a lack of patience that impacts her life.  Pt reports not getting a good nights sleep due to nightmares.     Chief Complaint:  Chief Complaint  Patient presents with   involuntary commitment   Visit Diagnosis: Bipolar d/o    CCA Screening, Triage and Referral (STR)  Patient Reported Information How did you hear about [place]? Family/Friend  What Is the Reason for Your Visit/Call Today? Pt has been living at [facility] for close to four years.  Pt had a fall on 01/05/21.  She said that she has had decrease in her quality of life.  She said that she used to be more independent and had a routine which worked for her.  She said "I was doing great."  She says "I do want out., I'm 89, I don't want to go through my life in this state."  Pt says "I have to though, there is nothing I can do about it."  She has never had any previous suicide attempt.  Patient had placed a bag on her head.  Pt says she cannot get around the way that she used to and she is impatient and frustrated.  She says "Im in a heck of a mess, nothing good is going to happen."  Pt is now having nightmares.  She says that she is eating fine but not as much.  Pt says she did tell her daughter "I want out" before she put the bag over her head.  Pt gave permission to talk to her son, [name].  He said that off and on she will say she does not want to live.  This has been for the last few months and the  fall has been" the straw that broke the camels back."  Son said that she has always had mood swings and would have very angry spells.  The last week has been worse with her saying she wants to die.  He says that she does not want to accept help.  Son wants to make sure to be informed of everything that is done.  He has concerns about whether she can go back to independent living.  He wants to make sure that she is returned to the appropriate level of care when she is discharged from inpatient psychiatric care.  How Long Has This Been Causing You Problems? 1 wk - 1 month  What Do You Feel Would Help You the Most Today? Treatment for Depression or other mood problem   Have You Recently Had Any Thoughts About Hurting Yourself? Yes  Are You Planning to Commit Suicide/Harm Yourself At This time? No ("I don't want to end my life, I really don't.")   Have you Recently Had Thoughts About [name]? No  Are You Planning to Harm Someone at This Time? No  Explanation: No data recorded  Have You Used Any Alcohol or Drugs in the Past 24 Hours? No  How Long Ago Did You Use  Drugs or Alcohol? No data recorded What Did You Use and How Much? No data recorded  Do You Currently Have a Therapist/Psychiatrist? No  Name of Therapist/Psychiatrist: No data recorded  Have You Been Recently Discharged From Any Office Practice or Programs? No  Explanation of Discharge From Practice/Program: No data recorded    CCA Screening Triage Referral Assessment Type of Contact: Tele-Assessment  Telemedicine Service Delivery:   Is this Initial or Reassessment? Initial Assessment  Date Telepsych consult ordered in [facility]:  01/16/21  Time Telepsych consult ordered in [facility]:  1736  Location of Assessment: [facility]  Provider Location: [facility] Assessment Services   Collateral Involvement: [name], son & POA [phone]   Does Patient Have a Court Appointed Legal Guardian? No data recorded Name and  Contact of Legal Guardian: No data recorded If Minor and Not Living with Parent(s), Who has Custody? No data recorded Is CPS involved or ever been involved? Never  Is APS involved or ever been involved? Never   Patient Determined To Be At Risk for Harm To Self or Others Based on Review of Patient Reported Information or Presenting Complaint? Yes, for Self-Harm  Method: No data recorded Availability of Means: No data recorded Intent: No data recorded Notification Required: No data recorded Additional Information for Danger to Others Potential: No data recorded Additional Comments for Danger to Others Potential: No data recorded Are There Guns or Other Weapons in Your Home? No data recorded Types of Guns/Weapons: No data recorded Are These Weapons Safely Secured?                            No data recorded Who Could Verify You Are Able To Have These Secured: No data recorded Do You Have any Outstanding Charges, Pending Court Dates, Parole/Probation? No data recorded Contacted To Inform of Risk of Harm To Self or Others: No data recorded   Does Patient Present under Involuntary Commitment? Yes  IVC Papers Initial File Date: 01/16/21   [place] of Residence: [name]   Patient Currently Receiving the Following Services: Not Receiving Services   Determination of Need: Urgent (48 hours)   Options For Referral: Inpatient Hospitalization (Geropsych unit)     CCA Biopsychosocial Patient Reported Schizophrenia/Schizoaffective Diagnosis in Past: No   Strengths: "My health was good, my mental outlook was good. "   Mental Health Symptoms Depression:   Change in energy/activity   Duration of Depressive symptoms:  Duration of Depressive Symptoms: Greater than two weeks   Mania:   None   Anxiety:    Difficulty concentrating   Psychosis:   None   Duration of Psychotic symptoms:    Trauma:   None   Obsessions:   Disrupts routine/functioning   Compulsions:   None    Inattention:   None   Hyperactivity/Impulsivity:   None   Oppositional/Defiant Behaviors:   None   Emotional Irregularity:   None   Other Mood/Personality Symptoms:  No data recorded   Mental Status Exam Appearance and self-care  Stature:   Average   Weight:   Average weight   Clothing:   Casual   Grooming:   Normal   Cosmetic use:   Age appropriate   Posture/gait:   Other (Comment) (P is using a walker)   Motor activity:   Slowed   Sensorium  Attention:   Normal   Concentration:   Anxiety interferes; Preoccupied   Orientation:   X5  Recall/memory:   Normal   Affect and Mood  Affect:   Anxious   Mood:   Hopeless; Dysphoric   Relating  Eye contact:   Normal   Facial expression:   Anxious   Attitude toward examiner:   Cooperative   Thought and Language  Speech flow:  Clear and Coherent   Thought content:   Appropriate to Mood and Circumstances   Preoccupation:   Somatic   Hallucinations:   None   Organization:  No data recorded  [employer] of Knowledge:   Average   Intelligence:   Average   Abstraction:   Normal   Judgement:   Impaired   Reality Testing:   Distorted   Insight:   Fair   Decision Making:   Impulsive   Social Functioning  Social Maturity:   Impulsive   Social Judgement:   Normal   Stress  Stressors:   Illness   Coping Ability:   Programme researcher, broadcasting/film/video Deficits:   Self-control   Supports:  No data recorded    Religion:    Leisure/Recreation:    Exercise/Diet: Exercise/Diet Do You Exercise?: Yes What Type of Exercise Do You Do?:  (Pt does her bed exercises.) How Many Times a Week Do You Exercise?: Daily Have You Gained or Lost A Significant Amount of Weight in the Past Six Months?: No Do You Have Any Trouble Sleeping?: Yes Explanation of Sleeping Difficulties: Pt has been having nightmares.   CCA Employment/Education Employment/Work  Situation: Employment / Work Technical sales engineer: Retired Has Patient ever [place] in Passenger transport manager?: No  Education: Education Is Patient Currently Attending School?: No Did Physicist, medical?: Yes What Type of College Degree Do you Have?: To years of college   CCA Family/Childhood History Family and Relationship History: Family history Marital status: Widowed Widowed, when?: Two years ago. Does patient have children?: Yes How many children?: 2 How is patient's relationship with their children?: "They are great kids."  Childhood History:  Childhood History By whom was/is the patient raised?: Mother Did patient suffer any verbal/emotional/physical/sexual abuse as a child?: No Did patient suffer from severe childhood neglect?: No Has patient ever been sexually abused/assaulted/raped as an adolescent or adult?: No Was the patient ever a victim of a crime or a disaster?: No Witnessed domestic violence?: No Has patient been affected by domestic violence as an adult?: No  Child/Adolescent Assessment:     CCA Substance Use Alcohol/Drug Use: Alcohol / Drug Use Pain Medications: See PTA medication list Prescriptions: See PTA medication list Over the Counter: See PTA medication list History of alcohol / drug use?: No history of alcohol / drug abuse                         ASAM's:  Six Dimensions of Multidimensional Assessment  Dimension 1:  Acute Intoxication and/or Withdrawal Potential:      Dimension 2:  Biomedical Conditions and Complications:      Dimension 3:  Emotional, Behavioral, or Cognitive Conditions and Complications:     Dimension 4:  Readiness to Change:     Dimension 5:  Relapse, Continued use, or Continued Problem Potential:     Dimension 6:  Recovery/Living Environment:     ASAM Severity Score:    ASAM Recommended Level of Treatment:     Substance use Disorder (SUD)    Recommendations for Services/Supports/Treatments:     Discharge Disposition:    DSM5 Diagnoses: Patient Active Problem  List   Diagnosis Date Noted   Pincer nail deformity 12/01/2020   Iron deficiency anemia 03/24/2020   Clavi 02/19/2020   Lumbar radiculopathy 06/17/2019   Pain due to onychomycosis of toenails of both feet 05/14/2019   Mixed incontinence 03/10/2019   Cervical spondylosis 06/07/2018   DDD (degenerative disc disease), cervical 05/20/2018   History of total knee replacement, left 04/13/2018   History of total knee replacement, right 04/13/2018   History of revision of total replacement of left hip joint 04/13/2018   Gait abnormality 09/08/2017   Chronic cystitis 07/05/2017   Dysuria 07/05/2017   Anemia 09/22/2015   h/o pericarditis    Essential hypertension    Aortic insufficiency    Posterior vitreous detachment, bilateral 06/05/2015   Left wrist pain 05/16/2015   Triquetral chip fracture, left, closed, initial encounter 05/16/2015   Primary osteoarthritis of first carpometacarpal joint of right hand 02/11/2015   Primary osteoarthritis of right wrist 02/11/2015   Arthropathy 11/25/2014   Subluxation 11/25/2014   Hip dislocation, left ([place]) 12/05/2013   Trochanteric bursitis of left hip 11/24/2012   Aneurysm of thoracic aorta (HCC)    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

## 2021-01-17 NOTE — ED Notes (Signed)
Warm blankets given to pt. Pt currently using desk phone to call her son.

## 2021-01-17 NOTE — ED Notes (Signed)
Pt's sweater and t-shirt placed in [place] locker #2 with her other belongings.

## 2021-01-17 NOTE — ED Notes (Signed)
Pt wishes to go for a walk. Pt using walker and sitting with pt for safety.

## 2021-01-17 NOTE — ED Notes (Signed)
Cleaned and dressed leg wound. Pt verbalized comfort. Provided toothbrush, deodorant etc for personal hygiene

## 2021-01-17 NOTE — ED Notes (Signed)
Pt approached this RN and asked if staff could provider her with stool softener and her dose of home PreserVision. EDP made aware of request.

## 2021-01-18 DIAGNOSIS — S0083XA Contusion of other part of head, initial encounter: Secondary | ICD-10-CM | POA: Diagnosis not present

## 2021-01-18 LAB — BASIC METABOLIC PANEL
Anion gap: 7 (ref 5–15)
BUN: 20 mg/dL (ref 8–23)
CO2: 24 mmol/L (ref 22–32)
Calcium: 8.4 mg/dL — ABNORMAL LOW (ref 8.9–10.3)
Chloride: 100 mmol/L (ref 98–111)
Creatinine, Ser: 1.08 mg/dL — ABNORMAL HIGH (ref 0.44–1.00)
GFR, Estimated: 49 mL/min — ABNORMAL LOW (ref >60)
Glucose, Bld: 108 mg/dL — ABNORMAL HIGH (ref 70–99)
Potassium: 4 mmol/L (ref 3.5–5.1)
Sodium: 131 mmol/L — ABNORMAL LOW (ref 135–145)

## 2021-01-18 MED ORDER — ESCITALOPRAM OXALATE 10 MG PO TABS
5.0000 mg | ORAL_TABLET | Freq: Every day | ORAL | Status: DC
  Administered 2021-01-19: 5 mg via ORAL
  Filled 2021-01-18: qty 1

## 2021-01-18 NOTE — Progress Notes (Signed)
[place] Placement  Pt meets inpatient criteria per Prescott Gum, NP.Per [place], RN there are no appropriate beds at [facility]. Referral was sent to the following facilities.   [facility]  [place] [address] [phone] [place] [address] [phone]  [facility]  [place], [place] [address] [phone]  [facility]  [place], [place] [address] [phone] [facility]  [address] [place] [address] [phone]  [facility]  [address] [place] [address] [phone] [facility]  [address], [place] [address] [phone]   Situation ongoing,  CSW will follow up.   Benjaman Kindler, MSW, LCSWA 01/18/2021  @ 6:13 PM

## 2021-01-18 NOTE — ED Notes (Signed)
Pt ambulated self to bathroom with her walker alongside Danielle, NT.

## 2021-01-18 NOTE — ED Provider Notes (Signed)
  Physical Exam  BP 128/69 (BP Location: Left Arm)   Pulse 73   Temp (!) 97.5 F (36.4 C) (Oral)   Resp 14   SpO2 100%   Physical Exam  ED Course/Procedures     Procedures  MDM  Pending Geri psych placement after suicide attempt.  Had been medically cleared.  Reviewing records are.  Had a hyponatremia of 126.  Will recheck.  Still pending placement       Davonna Belling, MD 01/18/21 [phone]

## 2021-01-18 NOTE — ED Notes (Signed)
Pt speaking to daughter on phone. Pt stating staff is speaking down to her, and that she can not live this way in these conditions. Pt complaining that this is not her routine and this is not acceptable. She does not have her make and things from home. Pt states she does not have any soap or lotion. Toiletries had been provided to pt including, soap, lotion, toothpaste and toothbrush. All are on bedside table.

## 2021-01-18 NOTE — ED Notes (Signed)
Pt very anxious about not being on her normal schedule . From medication, shower, makeup etc pt complaining this is not working out for her. Process explained several times. Pt each time verbalized understanding. Pt escorted to restroom and provided wash clothes to wash face. Pt verbalized understanding a shower would be available to her sometime during the day.

## 2021-01-18 NOTE — ED Notes (Signed)
Spoke to Pt daughter [name]. Daughter was upset since she had not been updated about pt care. Daughter wants to ensure pt is offered wheelchair in order to get to bathroom. This RN explained pt has a walker and staff is walking with pt for safety. Pt has steady gait and does not endorse pain as pt is telling daughter. Daughter would like to ensure pt gets a shower dailey. This RN assured daughter that would take place, sometime during the day. Daughter would like home feminine pads to be given to pt as well.

## 2021-01-18 NOTE — ED Notes (Signed)
Pt stated that she would like to call her daughter. This RN provided the desk phone for pt's phone call.

## 2021-01-18 NOTE — ED Notes (Signed)
Pt given supplies to shower with. Sitter escorted pt to the shower. Will continue to monitor.

## 2021-01-18 NOTE — ED Notes (Addendum)
Patient's daughter called [facility] Guardian [phone] upset.  Stated she was speaking with her mom and asked to speak with the nurse and she heard nurse Baxter Hire) say she doesn't have time to speak with her it has to be the day time nurse.  I explained that the nurse was getting ready for shift change and was very busy.

## 2021-01-19 ENCOUNTER — Inpatient Hospital Stay
Admission: RE | Admit: 2021-01-19 | Discharge: 2021-01-21 | DRG: 882 | Disposition: A | Discharge status: Nursing Home (Includes ICF) | Payer: Medicare Other | Source: Intra-hospital | Attending: Psychiatry | Admitting: Psychiatry

## 2021-01-19 ENCOUNTER — Other Ambulatory Visit: Payer: Self-pay

## 2021-01-19 ENCOUNTER — Other Ambulatory Visit: Payer: Self-pay | Admitting: Psychiatry

## 2021-01-19 ENCOUNTER — Encounter: Payer: Self-pay | Admitting: Psychiatry

## 2021-01-19 DIAGNOSIS — Z823 Family history of stroke: Secondary | ICD-10-CM | POA: Diagnosis not present

## 2021-01-19 DIAGNOSIS — R030 Elevated blood-pressure reading, without diagnosis of hypertension: Secondary | ICD-10-CM | POA: Diagnosis not present

## 2021-01-19 DIAGNOSIS — F332 Major depressive disorder, recurrent severe without psychotic features: Secondary | ICD-10-CM | POA: Insufficient documentation

## 2021-01-19 DIAGNOSIS — Z85828 Personal history of other malignant neoplasm of skin: Secondary | ICD-10-CM | POA: Diagnosis not present

## 2021-01-19 DIAGNOSIS — Z20822 Contact with and (suspected) exposure to covid-19: Secondary | ICD-10-CM | POA: Diagnosis present

## 2021-01-19 DIAGNOSIS — Z882 Allergy status to sulfonamides status: Secondary | ICD-10-CM | POA: Diagnosis not present

## 2021-01-19 DIAGNOSIS — E871 Hypo-osmolality and hyponatremia: Secondary | ICD-10-CM | POA: Diagnosis not present

## 2021-01-19 DIAGNOSIS — R45851 Suicidal ideations: Secondary | ICD-10-CM | POA: Diagnosis present

## 2021-01-19 DIAGNOSIS — Z87891 Personal history of nicotine dependence: Secondary | ICD-10-CM | POA: Diagnosis not present

## 2021-01-19 DIAGNOSIS — N182 Chronic kidney disease, stage 2 (mild): Secondary | ICD-10-CM | POA: Diagnosis present

## 2021-01-19 DIAGNOSIS — F4325 Adjustment disorder with mixed disturbance of emotions and conduct: Principal | ICD-10-CM | POA: Diagnosis present

## 2021-01-19 DIAGNOSIS — Z8249 Family history of ischemic heart disease and other diseases of the circulatory system: Secondary | ICD-10-CM

## 2021-01-19 DIAGNOSIS — Z96642 Presence of left artificial hip joint: Secondary | ICD-10-CM | POA: Diagnosis present

## 2021-01-19 DIAGNOSIS — Z96653 Presence of artificial knee joint, bilateral: Secondary | ICD-10-CM | POA: Diagnosis present

## 2021-01-19 DIAGNOSIS — F319 Bipolar disorder, unspecified: Secondary | ICD-10-CM | POA: Diagnosis not present

## 2021-01-19 DIAGNOSIS — Z79899 Other long term (current) drug therapy: Secondary | ICD-10-CM | POA: Diagnosis not present

## 2021-01-19 DIAGNOSIS — S0083XA Contusion of other part of head, initial encounter: Secondary | ICD-10-CM | POA: Diagnosis not present

## 2021-01-19 DIAGNOSIS — Z888 Allergy status to other drugs, medicaments and biological substances status: Secondary | ICD-10-CM

## 2021-01-19 DIAGNOSIS — Z880 Allergy status to penicillin: Secondary | ICD-10-CM | POA: Diagnosis not present

## 2021-01-19 DIAGNOSIS — I129 Hypertensive chronic kidney disease with stage 1 through stage 4 chronic kidney disease, or unspecified chronic kidney disease: Secondary | ICD-10-CM | POA: Diagnosis present

## 2021-01-19 DIAGNOSIS — Z90710 Acquired absence of both cervix and uterus: Secondary | ICD-10-CM | POA: Diagnosis not present

## 2021-01-19 DIAGNOSIS — Z881 Allergy status to other antibiotic agents status: Secondary | ICD-10-CM

## 2021-01-19 DIAGNOSIS — I351 Nonrheumatic aortic (valve) insufficiency: Secondary | ICD-10-CM | POA: Diagnosis present

## 2021-01-19 DIAGNOSIS — J189 Pneumonia, unspecified organism: Secondary | ICD-10-CM | POA: Diagnosis not present

## 2021-01-19 DIAGNOSIS — Z7982 Long term (current) use of aspirin: Secondary | ICD-10-CM | POA: Diagnosis not present

## 2021-01-19 LAB — RESP PANEL BY RT-PCR (FLU A&B, COVID) ARPGX2
Influenza A by PCR: NEGATIVE
Influenza B by PCR: NEGATIVE
SARS Coronavirus 2 by RT PCR: NEGATIVE

## 2021-01-19 MED ORDER — POLYVINYL ALCOHOL 1.4 % OP SOLN
1.0000 [drp] | OPHTHALMIC | Status: DC | PRN
  Filled 2021-01-19: qty 15

## 2021-01-19 MED ORDER — BUDESONIDE 3 MG PO CPEP
3.0000 mg | ORAL_CAPSULE | Freq: Every day | ORAL | Status: DC
  Administered 2021-01-20 – 2021-01-21 (×2): 3 mg via ORAL
  Filled 2021-01-19 (×2): qty 1

## 2021-01-19 MED ORDER — SODIUM CHLORIDE 1 G PO TABS
1.0000 g | ORAL_TABLET | Freq: Every day | ORAL | Status: DC
  Administered 2021-01-20 – 2021-01-21 (×2): 1 g via ORAL
  Filled 2021-01-19 (×2): qty 1

## 2021-01-19 MED ORDER — ESCITALOPRAM OXALATE 10 MG PO TABS
5.0000 mg | ORAL_TABLET | Freq: Every day | ORAL | Status: DC
  Administered 2021-01-20: 5 mg via ORAL
  Filled 2021-01-19 (×2): qty 1

## 2021-01-19 MED ORDER — ACETAMINOPHEN 325 MG PO TABS: 650.0000 mg | ORAL_TABLET | Freq: Four times a day (QID) | ORAL | Status: DC | PRN

## 2021-01-19 MED ORDER — ALUM & MAG HYDROXIDE-SIMETH 200-200-20 MG/5ML PO SUSP: 30.0000 mL | ORAL | Status: DC | PRN

## 2021-01-19 MED ORDER — LORAZEPAM 1 MG PO TABS: 0.5000 mg | ORAL_TABLET | Freq: Once | ORAL | Status: DC

## 2021-01-19 MED ORDER — CARVEDILOL 6.25 MG PO TABS
12.5000 mg | ORAL_TABLET | Freq: Every day | ORAL | Status: DC
  Administered 2021-01-19 – 2021-01-20 (×2): 12.5 mg via ORAL
  Filled 2021-01-19 (×2): qty 2

## 2021-01-19 MED ORDER — MAGNESIUM HYDROXIDE 400 MG/5ML PO SUSP
30.0000 mL | Freq: Every day | ORAL | Status: DC | PRN
  Administered 2021-01-20: 30 mL via ORAL
  Filled 2021-01-19: qty 30

## 2021-01-19 MED ORDER — AZITHROMYCIN 250 MG PO TABS
250.0000 mg | ORAL_TABLET | Freq: Every day | ORAL | Status: AC
  Administered 2021-01-20 – 2021-01-21 (×2): 250 mg via ORAL
  Filled 2021-01-19 (×2): qty 1

## 2021-01-19 MED ORDER — MUPIROCIN 2 % EX OINT
1.0000 "application " | TOPICAL_OINTMENT | Freq: Every day | CUTANEOUS | Status: DC
  Administered 2021-01-20 – 2021-01-21 (×2): 1 via TOPICAL
  Filled 2021-01-19: qty 22

## 2021-01-19 MED ORDER — DOCUSATE SODIUM 100 MG PO CAPS
100.0000 mg | ORAL_CAPSULE | Freq: Two times a day (BID) | ORAL | Status: DC
  Administered 2021-01-19 – 2021-01-21 (×4): 100 mg via ORAL
  Filled 2021-01-19 (×4): qty 1

## 2021-01-19 MED ORDER — FERROUS SULFATE 325 (65 FE) MG PO TABS
325.0000 mg | ORAL_TABLET | Freq: Every day | ORAL | Status: DC
  Administered 2021-01-20 – 2021-01-21 (×2): 325 mg via ORAL
  Filled 2021-01-19 (×2): qty 1

## 2021-01-19 MED ORDER — DILTIAZEM HCL ER COATED BEADS 240 MG PO CP24
240.0000 mg | ORAL_CAPSULE | Freq: Every evening | ORAL | Status: DC
  Administered 2021-01-19 – 2021-01-20 (×2): 240 mg via ORAL
  Filled 2021-01-19 (×3): qty 1

## 2021-01-19 MED ORDER — CARVEDILOL 6.25 MG PO TABS
6.2500 mg | ORAL_TABLET | Freq: Every day | ORAL | Status: DC
  Administered 2021-01-20 – 2021-01-21 (×2): 6.25 mg via ORAL
  Filled 2021-01-19 (×2): qty 1

## 2021-01-19 MED ORDER — ESTRADIOL 1 MG PO TABS
0.5000 mg | ORAL_TABLET | Freq: Every day | ORAL | Status: DC
  Administered 2021-01-20 – 2021-01-21 (×2): 0.5 mg via ORAL
  Filled 2021-01-19 (×2): qty 0.5

## 2021-01-19 MED ORDER — ASPIRIN 81 MG PO CHEW
81.0000 mg | CHEWABLE_TABLET | Freq: Every evening | ORAL | Status: DC
  Administered 2021-01-19 – 2021-01-20 (×2): 81 mg via ORAL
  Filled 2021-01-19 (×2): qty 1

## 2021-01-19 NOTE — Progress Notes (Addendum)
Patient admitted IVC to [facility] from [facility] ED with diagnosis of suicide attempt.  Patient presents to unit via [place] A&Ox4.  VSS.  Patient states, "I had a moment that day when I thought it would be easier if I was gone.  Big mistake.  I'm better now." Patient's affect is pleasant, calm and cooperative.  Patient currently denies suicidal ideations, depression, anxiety, homicidal ideations, audio or visual hallucinations and verbally contracts for safety on unit.  Patient states living at [facility]. Patient had fall on 10/24 sustaining injuries to left forehead now scabbing and OTA and left knee which is swollen, bruised with Allyvyn foam dressing in place which is clean, dry and intact. Denies pain at this time. Patient reports ambulating independently with RW at facility and front wheel RW provided on unit. Falls precautions implemented.   Patient oriented to unit, room and call light.  Denies, pain, discomfort, needs or complaints at this time.  Will continue to monitor with ongoing Q 15 minute safety checks per unit protocol.  Patient's daughter, [name], called and updated on pt arrival to unit.  All questions answered and understanding verbalized.

## 2021-01-19 NOTE — Progress Notes (Signed)
Pt accepted to [facility] L[facility]30    Patient meets inpatient criteria per [name], NP   The attending provider will be Dr. Weber Cooks  Call report to [phone]    Terrill Mohr, RN @ [facility] ED notified.     Pt scheduled  to arrive at [facility]. Pt's bed is currently ready.    Mariea Clonts, MSW, LCSW-A  2:21 PM 01/19/2021

## 2021-01-19 NOTE — ED Notes (Signed)
[name] daughter [phone] asking about placement, states she will call again later to check

## 2021-01-19 NOTE — ED Notes (Signed)
Pt to shower. Given fresh scrubs, socks and linens. Sitter now at bedside. Pt denies needs.

## 2021-01-19 NOTE — ED Notes (Signed)
Pt's wound dressing was changed, wound rinsed with sterile water, Bactroban ointment was applied, and non-adherent dressing applied over wound.

## 2021-01-19 NOTE — ED Notes (Signed)
Sheriffs department called to transport patient

## 2021-01-19 NOTE — ED Notes (Signed)
Pt took AM meds without issue or complaint but refused dressing change to L knee.

## 2021-01-19 NOTE — Tx Team (Signed)
Initial Treatment Plan 01/19/2021 7:31 PM [name] [id number]    PATIENT STRESSORS: Loss of quality of life     PATIENT STRENGTHS: Ability for insight  Active sense of humor  Communication skills  Financial means  Supportive family/friends    PATIENT IDENTIFIED PROBLEMS: Patient denies problems. Pt states "I had a moment when I thought it was easier if I'm gone.  Big mistake.  I'm better now,"                     DISCHARGE CRITERIA:  Improved stabilization in mood, thinking, and/or behavior Reduction of life-threatening or endangering symptoms to within safe limits Verbal commitment to aftercare and medication compliance  PRELIMINARY DISCHARGE PLAN: Return to previous living arrangement  PATIENT/FAMILY INVOLVEMENT: This treatment plan has been presented to and reviewed with the patient, [name], and/or family member, [name].  The patient and family have been given the opportunity to ask questions and make suggestions.  Conard Novak, RN 01/19/2021, 7:31 PM

## 2021-01-19 NOTE — ED Provider Notes (Signed)
Patient excepted by [facility] behavioral health for admission.  Accepted by Dr. Weber Cooks.    Fredia Sorrow, MD 01/19/21 [phone]

## 2021-01-19 NOTE — ED Notes (Signed)
Pt has ambulated to the bathroom and back to bed. Was on phone with family member earlier and heard saying "well this is it, this is the end! I'm living through hell, this is hell and I'm living it!" Pt resting in stretcher at this time, no distress noted. Awaiting meal trays.

## 2021-01-19 NOTE — ED Notes (Signed)
Pt states she does not want to take the Lexapro tonight and requests to further discuss it with a doctor tomorrow.

## 2021-01-20 DIAGNOSIS — F4325 Adjustment disorder with mixed disturbance of emotions and conduct: Principal | ICD-10-CM

## 2021-01-20 NOTE — Progress Notes (Signed)
Hospital meal provided, pt tolerated w/o complaints.  Waste discarded appropriately.  

## 2021-01-20 NOTE — Progress Notes (Signed)
Pt reported PRN constipation medication was effective

## 2021-01-20 NOTE — Progress Notes (Signed)
Patient currently denies SI/HI/AHV. Patient currently denies pain. Patient interactive with staff and peers.  Snacks provided to patient. Patient cooperative and compliant with medication administration.  Q15 minute safety checks maintained. Patient remains safe on the unit at this time.

## 2021-01-20 NOTE — [facility] Suicide Risk Assessment (Signed)
[facility] Admission Suicide Risk Assessment   Nursing information obtained from:  Patient Demographic factors:  Age 87 or older Current Mental Status:  NA Loss Factors:  NA Historical Factors:  NA Risk Reduction Factors:  NA  Total Time spent with patient: 1 hour Principal Problem: Adjustment disorder with mixed disturbance of emotions and conduct Diagnosis:  Principal Problem:   Adjustment disorder with mixed disturbance of emotions and conduct  Subjective Data: Patient seen and chart reviewed.  87 year old woman transferred to [place] from [facility] where she had presented to the hospital after voicing suicidal ideation and briefly putting a plastic bag over her head.  Patient currently denies any suicidal thought intent or plan.  Reports that mood is much improved.  Shows good insight into her behavior and thinking.  No psychotic symptoms.  Minimal memory problems.  Very much would like to go home.  Not currently reporting any active symptoms of depression  Continued Clinical Symptoms:  Alcohol Use Disorder Identification Test Final Score (AUDIT): 0 The "Alcohol Use Disorders Identification Test", Guidelines for Use in Primary Care, Second Edition.  World Pharmacologist [facility]). Score between 0-7:  no or low risk or alcohol related problems. Score between 8-15:  moderate risk of alcohol related problems. Score between 16-19:  high risk of alcohol related problems. Score 20 or above:  warrants further diagnostic evaluation for alcohol dependence and treatment.   CLINICAL FACTORS:   Depression:   Impulsivity   Musculoskeletal: Strength & Muscle Tone: within normal limits Gait & Station: unsteady Patient leans: N/A  Psychiatric Specialty Exam:  Presentation  General Appearance: No data recorded Eye Contact:No data recorded Speech:No data recorded Speech Volume:No data recorded Handedness:No data recorded  Mood and Affect  Mood:No data recorded Affect:No data recorded  Thought  Process  Thought Processes:No data recorded Descriptions of Associations:No data recorded Orientation:No data recorded Thought Content:No data recorded History of Schizophrenia/Schizoaffective disorder:No  Duration of Psychotic Symptoms:No data recorded Hallucinations:No data recorded Ideas of Reference:No data recorded Suicidal Thoughts:No data recorded Homicidal Thoughts:No data recorded  Sensorium  Memory:No data recorded Judgment:No data recorded Insight:No data recorded  Executive Functions  Concentration:No data recorded Attention Span:No data recorded Recall:No data recorded Fund of Knowledge:No data recorded Language:No data recorded  Psychomotor Activity  Psychomotor Activity:No data recorded  Assets  Assets:No data recorded  Sleep  Sleep:No data recorded   Physical Exam: Physical Exam Vitals and nursing note reviewed.  Constitutional:      Appearance: Normal appearance.  HENT:     Head: Normocephalic and atraumatic.     Mouth/Throat:     Pharynx: Oropharynx is clear.  Eyes:     Pupils: Pupils are equal, round, and reactive to light.  Cardiovascular:     Rate and Rhythm: Normal rate and regular rhythm.  Pulmonary:     Effort: Pulmonary effort is normal.     Breath sounds: Normal breath sounds.  Abdominal:     General: Abdomen is flat.     Palpations: Abdomen is soft.  Musculoskeletal:        General: Normal range of motion.  Skin:    General: Skin is warm and dry.     Findings: Bruising present.  Neurological:     General: No focal deficit present.     Mental Status: She is alert. Mental status is at baseline.  Psychiatric:        Attention and Perception: Attention normal.        Mood and Affect: Mood normal.  Speech: Speech normal.        Behavior: Behavior normal.        Thought Content: Thought content normal.        Cognition and Memory: Cognition normal. Memory is impaired.        Judgment: Judgment normal.   Review of  Systems  Constitutional: Negative.   HENT: Negative.    Eyes: Negative.   Respiratory: Negative.    Cardiovascular: Negative.   Gastrointestinal: Negative.   Musculoskeletal: Negative.   Skin: Negative.   Neurological: Negative.   Psychiatric/Behavioral: Negative.    Blood pressure (!) 172/67, pulse 72, temperature 98 F (36.7 C), temperature source Oral, resp. rate 18, height 5' 3.75" (1.619 m), weight 66.2 kg, SpO2 99 %. Body mass index is 25.26 kg/m.   COGNITIVE FEATURES THAT CONTRIBUTE TO RISK:  Polarized thinking    SUICIDE RISK:   Minimal: No identifiable suicidal ideation.  Patients presenting with no risk factors but with morbid ruminations; may be classified as minimal risk based on the severity of the depressive symptoms  PLAN OF CARE: Continue 15-minute checks.  Full treatment team evaluation.  Does not appear to necessarily have a need to begin any medication.  Supportive counseling and therapy.  Reassess tomorrow with probable discharge.  I certify that inpatient services furnished can reasonably be expected to improve the patient's condition.   Alethia Berthold, MD 01/20/2021, 3:16 PM

## 2021-01-20 NOTE — Progress Notes (Signed)
Patient is calm and cooperative. Denies anxiety and depression. Stated "I'm just tired". Denies SI, HI, and AVH. Denies pain. Compliant with medications. Currently in bed resting. Q 15 minute safety check in place. Patient remain safe in the unit at this time.

## 2021-01-20 NOTE — H&P (Signed)
Psychiatric Admission Assessment Adult  Patient Identification: [name] MRN:  [id number] Date of Evaluation:  01/20/2021 Chief Complaint:  Severe recurrent major depression without psychotic features ([place]) [F33.2] Principal Diagnosis: Adjustment disorder with mixed disturbance of emotions and conduct Diagnosis:  Principal Problem:   Adjustment disorder with mixed disturbance of emotions and conduct  History of Present Illness: Patient seen and chart reviewed.  This is an 87 year old woman who was transferred to [place] from [place].  She presented there after impulsively voicing suicidal ideation and briefly putting a plastic bag over her head while her daughter was visiting.  Patient had a fall some weeks ago and injured her left leg and also had extensive bruising on her face.  There is still some bruising left around her eyes.  Patient says she was feeling so ashamed of her appearance that she felt like she might as well just kill herself.  She admits to putting the bag over her head but says that is soon as she realized it was constricting her breathing she took it off immediately.  Since then she has denied any suicidal ideation.  Patient says her mood is much better now.  Does not describe a consistent long period of depression.  Denies suicidal thought.  Denies any psychotic symptoms.  Able to articulate multiple positive things about her life that she is looking forward to.  Able to express an understanding that the bruising will heal up and that she is already getting back to normal.  Patient lives in an assisted living facility.  Reports that she normally enjoys her life. Associated Signs/Symptoms: Depression Symptoms:  feelings of worthlessness/guilt, suicidal attempt, Duration of Depression Symptoms: Greater than two weeks  (Hypo) Manic Symptoms:  Impulsivity, Anxiety Symptoms:  Excessive Worry, Psychotic Symptoms:   None PTSD Symptoms: Negative Total Time spent with patient:  1 hour  Past Psychiatric History: Patient reports that when she was about 87 years old she was admitted to a psychiatric hospital around the time she was dealing with stress with her husband.  She does not remember the exact circumstances but insists that she had never tried to kill her self.  Was put on medication at the time but says she stopped it soon thereafter.  Since that time has never had any further psychiatric treatment or follow-up.  Denies any history of substance abuse  Is the patient at risk to self? No.  Has the patient been a risk to self in the past 6 months? Yes.    Has the patient been a risk to self within the distant past? No.  Is the patient a risk to others? No.  Has the patient been a risk to others in the past 6 months? No.  Has the patient been a risk to others within the distant past? No.   Prior Inpatient Therapy:   Prior Outpatient Therapy:    Alcohol Screening: 1. How often do you have a drink containing alcohol?: Never 2. How many drinks containing alcohol do you have on a typical day when you are drinking?: 1 or 2 3. How often do you have six or more drinks on one occasion?: Never AUDIT-C Score: 0 4. How often during the last year have you found that you were not able to stop drinking once you had started?: Never 5. How often during the last year have you failed to do what was normally expected from you because of drinking?: Never 6. How often during the last year have you needed  a first drink in the morning to get yourself going after a heavy drinking session?: Never 7. How often during the last year have you had a feeling of guilt of remorse after drinking?: Never 8. How often during the last year have you been unable to remember what happened the night before because you had been drinking?: Never 9. Have you or someone else been injured as a result of your drinking?: No 10. Has a relative or friend or a doctor or another health worker been concerned about  your drinking or suggested you cut down?: No Alcohol Use Disorder Identification Test Final Score (AUDIT): 0 Substance Abuse History in the last 12 months:  No. Consequences of Substance Abuse: Negative Previous Psychotropic Medications: Yes  Psychological Evaluations: Yes  Past Medical History:  Past Medical History:  Diagnosis Date   Anemia    CHRONIC   Aortic insufficiency    a. 2D echo 08/29/15: EF 55-60%, mild LVH, diastolic dysfunction, elevated LV filling pressure, mild AI, severe LAE, mild RAE, mild TR, dilated descending thoracic aorta just distal to the takeoff of the left subclavian, measuring 4.1 cm, no pericardial effusion.   Arthritis    OA-SOME BACK AND NECK PAIN,  GOES TO CHIROPRACTOR TWICE A MONTH;  HX OF JOINT REPLACEMENTS    Cancer ([place])    SKIN CANCERS REMOVED FROM LEGS   CKD (chronic kidney disease), stage II    Essential hypertension    Gait abnormality 09/08/2017   GERD (gastroesophageal reflux disease)    PSVT (paroxysmal supraventricular tachycardia) (HCC)    Recurrent Pericarditis    RECURRENT    Thoracic aneurysm    a. Followed by [facility] -last seen in 12/2013 w/ plan for f/u MRA and thoracic surgery f/u in 2 yrs, but patient does not wish to follow any longer.    Past Surgical History:  Procedure Laterality Date   APPENDECTOMY     BREAST EXCISIONAL BIOPSY Left 1992   benign   EXCISION/RELEASE BURSA HIP Left 11/24/2012   Procedure: LEFT HIP BURSECTOMY AND TENDON REPAIR ;  Surgeon: Gearlean Alf, MD;  Location: [facility];  Service: Orthopedics;  Laterality: Left;   HIP CLOSED REDUCTION Left 12/19/2012   Procedure: CLOSED MANIPULATION HIP;  Surgeon: Mauri Pole, MD;  Location: [facility];  Service: Orthopedics;  Laterality: Left;   HIP CLOSED REDUCTION Left 12/05/2013   Procedure: CLOSED MANIPULATION HIP;  Surgeon: Augustin Schooling, MD;  Location: [facility];  Service: Orthopedics;  Laterality: Left;   HIP CLOSED REDUCTION Left 09/22/2015   Procedure: CLOSED REDUCTION  HIP;  Surgeon: Melina Schools, MD;  Location: [facility];  Service: Orthopedics;  Laterality: Left;   JOINT REPLACEMENT     LEFT TOTAL HIP REPLACEMENT AND REVISIONS X 2   OOPHORECTOMY     has a partial of one ovary remaingin   PELVIC LAPAROSCOPY     ovarian cyst removal,    REVISION TOTAL HIP ARTHROPLASTY     left   right Achilles Tendon repair  5/13   TOTAL KNEE ARTHROPLASTY     right   TOTAL KNEE ARTHROPLASTY     left   VAGINAL HYSTERECTOMY     with ovarian cyst removal   Family History:  Family History  Problem Relation Age of Onset   Hypertension Mother    Stroke Mother    Heart attack Father 38   ALS Other    Breast cancer Neg Hx    Family Psychiatric  History: Denies knowing of  any Tobacco Screening:   Social History:  Social History   Substance and Sexual Activity  Alcohol Use No   Alcohol/week: 0.0 standard drinks     Social History   Substance and Sexual Activity  Drug Use No    Additional Social History: Marital status: Widowed Widowed, when?: 2 years ago Are you sexually active?:  (unable to assess) What is your sexual orientation?: Heterosexual Has your sexual activity been affected by drugs, alcohol, medication, or emotional stress?: Unable to assess Does patient have children?: Yes How many children?: 2 (1 son, 1 daughter) How is patient's relationship with their children?: Pt states she has a good relationship with her children, but they live out of state so she seems them occasionally                         Allergies:   Allergies  Allergen Reactions   Cephalexin Other (See Comments)    Pt states this causes eye problems and STROKE-LIKE SYMPTOMS!! Other reaction(s): stroke symptoms   Shellfish Allergy Hives and Rash   Shellfish-Derived Products Hives and Rash    Other reaction(s): hives   Clindamycin/Lincomycin Rash   Lincomycin Rash   Other     Other reaction(s): rash   Penicillamine    Sugar-Protein-Starch Other (See Comments)     Sugary foods cause legs to cramp   Tape Other (See Comments)    Please use paper tape or Coban Wrap; patient's skin tears VERY easily!!   Ciprofloxacin Rash    Other reaction(s): rash   Sulfa Antibiotics Hives and Rash   Sulfites Hives and Rash    Other reaction(s): swelling   Lab Results:  Results for orders placed or performed during the hospital encounter of 01/16/21 (from the past 48 hour(s))  Resp Panel by RT-PCR (Flu A&B, Covid) Nasopharyngeal Swab     Status: None   Collection Time: 01/19/21 12:20 PM   Specimen: Nasopharyngeal Swab; Nasopharyngeal(NP) swabs in vial transport medium  Result Value Ref Range   SARS Coronavirus 2 by RT PCR NEGATIVE NEGATIVE    Comment: (NOTE) SARS-CoV-2 target nucleic acids are NOT DETECTED.  The SARS-CoV-2 RNA is generally detectable in upper respiratory specimens during the acute phase of infection. The lowest concentration of SARS-CoV-2 viral copies this assay can detect is 138 copies/mL. A negative result does not preclude SARS-Cov-2 infection and should not be used as the sole basis for treatment or other patient management decisions. A negative result may occur with  improper specimen collection/handling, submission of specimen other than nasopharyngeal swab, presence of viral mutation(s) within the areas targeted by this assay, and inadequate number of viral copies(<138 copies/mL). A negative result must be combined with clinical observations, patient history, and epidemiological information. The expected result is Negative.  Fact Sheet for Patients:  EntrepreneurPulse.com.au  Fact Sheet for Healthcare Providers:  IncredibleEmployment.be  This test is no t yet approved or cleared by the [place] FDA and  has been authorized for detection and/or diagnosis of SARS-CoV-2 by FDA under an Emergency Use Authorization (EUA). This EUA will remain  in effect (meaning this test can be used) for the  duration of the COVID-19 declaration under Section 564(b)(1) of the Act, 21 U.S.C.section 360bbb-3(b)(1), unless the authorization is terminated  or revoked sooner.       Influenza A by PCR NEGATIVE NEGATIVE   Influenza B by PCR NEGATIVE NEGATIVE    Comment: (NOTE) The Xpert Xpress SARS-CoV-2/FLU/RSV plus assay is  intended as an aid in the diagnosis of influenza from Nasopharyngeal swab specimens and should not be used as a sole basis for treatment. Nasal washings and aspirates are unacceptable for Xpert Xpress SARS-CoV-2/FLU/RSV testing.  Fact Sheet for Patients: EntrepreneurPulse.com.au  Fact Sheet for Healthcare Providers: IncredibleEmployment.be  This test is not yet approved or cleared by the [place] FDA and has been authorized for detection and/or diagnosis of SARS-CoV-2 by FDA under an Emergency Use Authorization (EUA). This EUA will remain in effect (meaning this test can be used) for the duration of the COVID-19 declaration under Section 564(b)(1) of the Act, 21 U.S.C. section 360bbb-3(b)(1), unless the authorization is terminated or revoked.  Performed at [facility], [place] [address] [place], [place] [address]     Blood Alcohol level:  Lab Results  Component Value Date   ETH <10 26/71/2458    Metabolic Disorder Labs:  No results found for: HGBA1C, MPG No results found for: PROLACTIN No results found for: CHOL, TRIG, HDL, CHOLHDL, VLDL, LDLCALC  Current Medications: Current Facility-Administered Medications  Medication Dose Route Frequency Provider Last Rate Last Admin   acetaminophen (TYLENOL) tablet 650 mg  650 mg Oral Q6H PRN Neiko Trivedi T, MD       alum & mag hydroxide-simeth (MAALOX/MYLANTA) 200-200-20 MG/5ML suspension 30 mL  30 mL Oral Q4H PRN Shaakira Borrero T, MD       aspirin chewable tablet 81 mg  81 mg Oral QPM Wilmarie Sparlin, Madie Reno, MD   81 mg at 01/19/21 2103   azithromycin (ZITHROMAX) tablet 250 mg   250 mg Oral Daily Dwight Burdo T, MD   250 mg at 01/20/21 0931   budesonide (ENTOCORT EC) 24 hr capsule 3 mg  3 mg Oral Daily Duffy Dantonio, Madie Reno, MD   3 mg at 01/20/21 0931   carvedilol (COREG) tablet 12.5 mg  12.5 mg Oral QHS Tekoa Amon T, MD   12.5 mg at 01/19/21 2103   carvedilol (COREG) tablet 6.25 mg  6.25 mg Oral Daily Zael Shuman T, MD   6.25 mg at 01/20/21 0932   diltiazem (CARDIZEM CD) 24 hr capsule 240 mg  240 mg Oral QPM Jaleil Renwick T, MD   240 mg at 01/19/21 2103   docusate sodium (COLACE) capsule 100 mg  100 mg Oral BID Chanice Brenton T, MD   100 mg at 01/20/21 0931   escitalopram (LEXAPRO) tablet 5 mg  5 mg Oral Daily Lareina Espino, Madie Reno, MD   5 mg at 01/20/21 0931   estradiol (ESTRACE) tablet 0.5 mg  0.5 mg Oral Daily Emmalyne Giacomo T, MD   0.5 mg at 01/20/21 0931   ferrous sulfate tablet 325 mg  325 mg Oral Daily Annaya Bangert T, MD   325 mg at 01/20/21 0931   LORazepam (ATIVAN) tablet 0.5 mg  0.5 mg Oral Once Caroline Sauger, NP       magnesium hydroxide (MILK OF MAGNESIA) suspension 30 mL  30 mL Oral Daily PRN Katieann Hungate T, MD   30 mL at 01/20/21 1036   mupirocin ointment (BACTROBAN) 2 % 1 application  1 application Topical Daily Tomoya Ringwald T, MD   1 application at 09/98/33 0930   polyvinyl alcohol (LIQUIFILM TEARS) 1.4 % ophthalmic solution 1 drop  1 drop Both Eyes PRN Beau Vanduzer T, MD       sodium chloride tablet 1 g  1 g Oral Daily Kadarious Dikes, Madie Reno, MD   1 g at 01/20/21 [id number]  PTA Medications: Medications Prior to Admission  Medication Sig Dispense Refill Last Dose   amoxicillin (AMOXIL) 500 MG capsule Take 2,000 mg by mouth See admin instructions. 1 hour prior to dental appointment      aspirin 81 MG tablet Take 81 mg by mouth every evening.      betamethasone dipropionate 0.05 % cream Apply topically 2 (two) times daily. Applied to the right ear canal twice daily for 4 to 5 days or until itching resolves. (Patient not taking: No sig reported) 30 g 0     budesonide (ENTOCORT EC) 3 MG 24 hr capsule Take 3 mg by mouth daily.      Calcium Carbonate-Vit D-Min (CALCIUM 1200 PO) Take 1 tablet by mouth every evening.      carboxymethylcellulose (REFRESH PLUS) 0.5 % SOLN Place 1 drop into both eyes in the morning and at bedtime.      carvedilol (COREG) 12.5 MG tablet Take 1 tablet (12.5 mg total) by mouth 2 (two) times daily with a meal. 1/2 in the Am; 1 in the PM (Patient taking differently: Take 12.5 mg by mouth See admin instructions. 6.25 mg in the morning. 12.5 mg in the evening) 180 tablet 1    cholecalciferol (VITAMIN D) 1000 UNITS tablet Take 1,000 Units by mouth daily.      clobetasol cream (TEMOVATE) 4.43 % Apply 1 application topically 2 (two) times daily as needed (itching).       diltiazem (CARDIZEM CD) 240 MG 24 hr capsule Take 1 capsule (240 mg total) by mouth daily. (Patient taking differently: Take 240 mg by mouth every evening.) 90 capsule 3    estradiol (ESTRACE) 0.5 MG tablet Take 1 tablet (0.5 mg total) by mouth daily. 90 tablet 0    ferrous sulfate 325 (65 FE) MG tablet Take 325 mg by mouth daily.      meloxicam (MOBIC) 7.5 MG tablet Take 7.5 mg by mouth daily as needed for pain.       mupirocin ointment (BACTROBAN) 2 % Apply 1 application topically See admin instructions. Apply to leg after showers      omega-3 acid ethyl esters (LOVAZA) 1 g capsule Take 1 g by mouth daily.       oxyCODONE-acetaminophen (PERCOCET/ROXICET) 5-325 MG tablet Take 1 tablet by mouth every 6 (six) hours as needed for severe pain. 10 tablet 0    predniSONE (STERAPRED UNI-PAK 21 TAB) 10 MG (21) TBPK tablet Take 10 mg by mouth See admin instructions. 6,5,4,3,2,1 (Patient not taking: Reported on 01/16/2021)      sodium chloride 1 g tablet Take 1 g by mouth daily.      tretinoin (RETIN-A) 0.1 % cream Apply 1 application topically at bedtime as needed (breakouts).       Musculoskeletal: Strength & Muscle Tone: within normal limits Gait & Station:  unsteady Patient leans: N/A            Psychiatric Specialty Exam:  Presentation  General Appearance: No data recorded Eye Contact:No data recorded Speech:No data recorded Speech Volume:No data recorded Handedness:No data recorded  Mood and Affect  Mood:No data recorded Affect:No data recorded  Thought Process  Thought Processes:No data recorded Duration of Psychotic Symptoms: No data recorded Past Diagnosis of Schizophrenia or Psychoactive disorder: No  Descriptions of Associations:No data recorded Orientation:No data recorded Thought Content:No data recorded Hallucinations:No data recorded Ideas of Reference:No data recorded Suicidal Thoughts:No data recorded Homicidal Thoughts:No data recorded  Sensorium  Memory:No data recorded Judgment:No data recorded Insight:No  data recorded  Executive Functions  Concentration:No data recorded Attention Span:No data recorded [place] recorded Language:No data recorded  Psychomotor Activity  Psychomotor Activity:No data recorded  Assets  Assets:No data recorded  Sleep  Sleep:No data recorded   Physical Exam: Physical Exam Constitutional:      Appearance: Normal appearance.  HENT:     Head: Normocephalic and atraumatic.     Mouth/Throat:     Pharynx: Oropharynx is clear.  Eyes:     Pupils: Pupils are equal, round, and reactive to light.  Cardiovascular:     Rate and Rhythm: Normal rate and regular rhythm.  Pulmonary:     Effort: Pulmonary effort is normal.     Breath sounds: Normal breath sounds.  Abdominal:     General: Abdomen is flat.     Palpations: Abdomen is soft.  Musculoskeletal:        General: Normal range of motion.  Skin:    General: Skin is warm and dry.       Neurological:     General: No focal deficit present.     Mental Status: She is alert. Mental status is at baseline.  Psychiatric:        Attention and Perception: Attention normal.         Mood and Affect: Mood normal.        Speech: Speech normal.        Behavior: Behavior normal.        Thought Content: Thought content normal.        Cognition and Memory: Memory is impaired.   Review of Systems  Constitutional: Negative.   HENT: Negative.    Eyes: Negative.   Respiratory: Negative.    Cardiovascular: Negative.   Gastrointestinal: Negative.   Musculoskeletal: Negative.   Skin: Negative.   Neurological: Negative.   Psychiatric/Behavioral: Negative.    Blood pressure (!) 172/67, pulse 72, temperature 98 F (36.7 C), temperature source Oral, resp. rate 18, height 5' 3.75" (1.619 m), weight 66.2 kg, SpO2 99 %. Body mass index is 25.26 kg/m.  Treatment Plan Summary: Plan patient is currently denying any active symptoms of depression.  She issues with insight into understanding the inappropriate behavior that brought her into the hospital.  She is able to now clearly understand that her bruising and her leg are healing up and she will be getting back to normal.  No evidence of psychosis.  Mild memory impairment but does not appear to be demented.  Patient does not show evidence of requiring starting any medication.  We will reassess tomorrow at which point likely we will be discharging her back to her living facility  Observation Level/Precautions:  15 minute checks  Laboratory:  Chemistry Profile  Psychotherapy:    Medications:    Consultations:    Discharge Concerns:    Estimated LOS:  Other:     Physician Treatment Plan for Primary Diagnosis: Adjustment disorder with mixed disturbance of emotions and conduct Long Term Goal(s): Improvement in symptoms so as ready for discharge  Short Term Goals: Ability to verbalize feelings will improve and Ability to disclose and discuss suicidal ideas  Physician Treatment Plan for Secondary Diagnosis: Principal Problem:   Adjustment disorder with mixed disturbance of emotions and conduct  Long Term Goal(s): Improvement  in symptoms so as ready for discharge  Short Term Goals: Ability to identify and develop effective coping behaviors will improve  I certify that inpatient services furnished can reasonably be expected  to improve the patient's condition.    Alethia Berthold, MD 11/8/20223:19 PM

## 2021-01-20 NOTE — [facility] Suicide Risk Assessment (Signed)
[place] INPATIENT:  Family/Significant Other Suicide Prevention Education  Suicide Prevention Education: SPE completed with pt, as pt refused to consent to family contact. SPI pamphlet provided to pt and pt was encouraged to share information with support network, ask questions, and talk about any concerns relating to SPE. Pt denies access to guns/firearms and verbalized understanding of information provided. Mobile Crisis information also provided to pt.   Patient Refusal for Family/Significant Other Suicide Prevention Education: The patient [name] has refused to provide written consent for family/significant other to be provided Family/Significant Other Suicide Prevention Education during admission and/or prior to discharge.  Physician notified.  Kaidynce Pfister A [place] 01/20/2021, 12:15 PM

## 2021-01-20 NOTE — Progress Notes (Signed)
Pt took morning medication w/o complaints.  Pt stated she was experiencing constipation x2 days and requested M.O.M will continue to monitor for effectiveness.

## 2021-01-20 NOTE — Group Note (Signed)
[facility] LCSW Group Therapy Note   Group Date: 01/20/2021 Start Time: 4825 End Time: 1345  Type of Therapy/Topic:  Group Therapy:  Feelings about Diagnosis  Participation Level:  Minimal      Description of Group:    This group will allow patients to explore their thoughts and feelings about diagnoses they have received. Patients will be guided to explore their level of understanding and acceptance of these diagnoses. Facilitator will encourage patients to process their thoughts and feelings about the reactions of others to their diagnosis, and will guide patients in identifying ways to discuss their diagnosis with significant others in their lives. This group will be process-oriented, with patients participating in exploration of their own experiences as well as giving and receiving support and challenge from other group members.   Therapeutic Goals: 1. Patient will demonstrate understanding of diagnosis as evidence by identifying two or more symptoms of the disorder:  2. Patient will be able to express two feelings regarding the diagnosis 3. Patient will demonstrate ability to communicate their needs through discussion and/or role plays  Summary of Patient Progress:    Patient was present for half of the group session. Patient was an active listener and participated in the topic of discussion. Patient had a positive demeanor. Due to the courtyard being too cold, group was moved to the craft room. Patient declined to participate in the second portion of group.     Therapeutic Modalities:   Cognitive Behavioral Therapy Brief Therapy Feelings Identification    Tezra Mahr A [place], LCSWA

## 2021-01-20 NOTE — BHH Counselor (Signed)
Adult Comprehensive Assessment  Patient ID: [name], female   DOB: [birth year], 87 y.o.   MRN: [id number]  Information Source: Information source: Patient  Current Stressors:  Patient states their primary concerns and needs for treatment are:: "[place], bruised my face, that's what led to all of this." Patient states their goals for this hospitilization and ongoing recovery are:: "I was looking so bad, I was just ready to go on, but realized that was stupidTherapist, music / Learning stressors: Pt denies Employment / Job issues: Pt denies Family Relationships: Pt denies Museum/gallery curator / Lack of resources (include bankruptcy): Pt denies Housing / Lack of housing: Pt denies Physical health (include injuries & life threatening diseases): HTN, GERD, ankle injury, bruising on temple/face Social relationships: Pt denies Substance abuse: Pt denies Bereavement / Loss: Pt stated her husband passed away 2 years ago  Living/Environment/Situation:  Living Arrangements: Other (Comment) ([place]) Living conditions (as described by patient or guardian): Comfortable How long has patient lived in current situation?: 4 years What is atmosphere in current home: Comfortable, Supportive  Family History:  Marital status: Widowed Widowed, when?: 2 years ago Are you sexually active?:  (unable to assess) What is your sexual orientation?: Heterosexual Has your sexual activity been affected by drugs, alcohol, medication, or emotional stress?: Unable to assess Does patient have children?: Yes How many children?: 2 (1 son, 1 daughter) How is patient's relationship with their children?: Pt states she has a good relationship with her children, but they live out of state so she seems them occasionally  Childhood History:  By whom was/is the patient raised?: Mother, Other (Comment) (Aunts and uncles) Additional childhood history information: Pt states her father died before she was  born Description of patient's relationship with caregiver when they were a child: Pt states she was well taken care of by her mother Patient's description of current relationship with people who raised him/her: Deceased How were you disciplined when you got in trouble as a child/adolescent?: Pt states she never was into any trouble Does patient have siblings?: Yes Number of Siblings: 5 Description of patient's current relationship with siblings: Pt states she has one living sister in [place] Did patient suffer any verbal/emotional/physical/sexual abuse as a child?: No Did patient suffer from severe childhood neglect?: No Has patient ever been sexually abused/assaulted/raped as an adolescent or adult?: No Was the patient ever a victim of a crime or a disaster?: No Witnessed domestic violence?: No Has patient been affected by domestic violence as an adult?: No  Education:  Highest grade of school patient has completed: Diplomatic Services operational officer in [place]" Currently a Ship broker?: No Learning disability?: No  Employment/Work Situation:   Employment Situation: Retired Social research officer, government has Been Impacted by Current Illness: No What is the Longest Time Patient has Held a Job?: Pt states she never really worked Has Patient ever Been in Passenger transport manager?: No  Financial Resources:   Museum/gallery curator resources: [employer], Income from spouse Does patient have a Programmer, applications or guardian?: No (Pt states her children are her power of attorneys but she is her own guardian)  Alcohol/Substance Abuse:   What has been your use of drugs/alcohol within the last 12 months?: Pt denies If attempted suicide, did drugs/alcohol play a role in this?: No Alcohol/Substance Abuse Treatment Hx: Denies past history Has alcohol/substance abuse ever caused legal problems?: No  Social Support System:   Patient's Community Support System: Good Describe Community Support System: Pt states her friends at her assisted living facility  and  her family Type of faith/religion: "Presbyterian, I belive in God" How does patient's faith help to cope with current illness?: "I pray to God about everything"  Leisure/Recreation:   Do You Have Hobbies?: Yes Leisure and Hobbies: "I like to work out, like my routine"  Strengths/Needs:   What is the patient's perception of their strengths?: "Like the fact that I'm confident, like the fact that I've got my act together" Patient states they can use these personal strengths during their treatment to contribute to their recovery: Pt states that her suicide attempt was a "slip in the road" and that she is ready to get back to her routine, she just lost her confidence Patient states these barriers may affect/interfere with their treatment: Pt denies Patient states these barriers may affect their return to the community: Pt denies Other important information patient would like considered in planning for their treatment: Pt denies  Discharge Plan:   Currently receiving community mental health services: No Patient states concerns and preferences for aftercare planning are: Pt states that she knows who to get in touch with if needed for mental health support, but she states she does not want to "start all of that" Patient states they will know when they are safe and ready for discharge when: "I'm ready to get back into my life" Does patient have access to transportation?: No Does patient have financial barriers related to discharge medications?: No Plan for no access to transportation at discharge: CSW will assist pt in arranging transportaion upon discharge Will patient be returning to same living situation after discharge?: Yes  Summary/Recommendations:   Summary and Recommendations (to be completed by the evaluator): Patient is a 87 year old female, widowed, from [place] ([place]). She reports that she receives SSI. She presents to the hospital involuntarily following suicide  attempt due to a serious fall. Recent stressors include: decressed quality of life involving loss of confidence, lack of routine, and increased pain/medical need due to pt's recent fall a few weeks ago. She has a primary diagnosis of Severe recurrent major depression without psychotic features. Patient's children are her power of attorney and she can return to her assisted living facility at [facility]. Recommendations include: crisis stabilization, therapeutic milieu, encourage group attendance and participation, medication management for mood stabilization and development of comprehensive mental wellness plan.  [name]. 01/20/2021

## 2021-01-20 NOTE — Progress Notes (Signed)
Recreation Therapy Notes   Date: 01/20/2021  Time: 1:15 pm   Location: Courtyard /Craft room  Behavioral response: Appropriate  Intervention Topic: Relaxation    Discussion/Intervention:  Group content today was focused on relaxation. The group defined relaxation and identified healthy ways to relax. Individuals expressed how much time they spend relaxing. Patients expressed how much their life would be if they did not make time for themselves to relax. The group stated ways they could improve their relaxation techniques in the future.  Individuals participated in the intervention "Time to Relax" where they had a chance to experience different relaxation techniques.  Clinical Observations/Feedback: Patient came to group and identified laying down and sitting in the sun as relaxing. Patient left group early and did not return.    Anahla Bevis LRT/CTRS       Gentry Seeber 01/20/2021 2:38 PM

## 2021-01-20 NOTE — Progress Notes (Signed)
Pt preformed ADLS with minimal set up assistance only. Cleaned and redressed wound on L knee, and addressed wound on L temple.  Abrasion on pt L temple is complete closed/scabbed and shows no s/s of infection.  Pt wound on L knee is partially scabbed and shows no s/s of infection.  Cont to monitor/with dressing changes as ordered

## 2021-01-21 ENCOUNTER — Telehealth: Payer: Self-pay | Admitting: Interventional Cardiology

## 2021-01-21 NOTE — Telephone Encounter (Signed)
Pt c/o medication issue:  1. Name of Medication:  carvedilol (COREG) 12.5 MG tablet carvedilol (COREG) tablet 6.25 mg  2. How are you currently taking this medication (dosage and times per day)? Pharmacy is not sure.   3. Are you having a reaction (difficulty breathing--STAT)?   4. What is your medication issue? Pharmacy called for clarification on the dosage instructions. Multiple rx are in the system for the patient.   Please call [phone] option 2 Use Reference# [id number]

## 2021-01-21 NOTE — Progress Notes (Signed)
Patient seen in dayroom.  Appearance is neat and well groomed.  Patient is A&Ox4.  Affect is calm, pleasant and cooperative.  Patient states, "I'm doing alright.  I'm ready to leave today."  VSS.  Denies AVH, SI / HI, depression or anxiety.  Denies pain.  Reports sleeping well and appetite fair.  Patient refused Lexapro stating, "I don't need that."  Educated on importance of taking to improve mood.  Pt adamantly refused, stating, "I told you I don't need that."  MD informed. Reviewed POC with patient. Questions answered and understanding verbalized. Ongoing Q15 minute safety check rounds per unit protocol.

## 2021-01-21 NOTE — Progress Notes (Signed)
  [facility] Adult Case Management Discharge Plan :  Will you be returning to the same living situation after discharge:  Yes,  pt returning to assisted living facility  At discharge, do you have transportation home?: Yes,  CSW will assist pt in arranging transportation for pt Do you have the ability to pay for your medications: Yes,  pt has Medicare Part A & B  Release of information consent forms completed and in the chart;  Patient's signature needed at discharge.  Patient to Follow up at:  Follow-up [place] at [facility] Follow up.   Why: Though you declined aftercare services, please reach out as needed for any behavioral health concerns. Thanks! Contact information: [place], [place] Phone: [phone]  Behavioral Medicine: [phone]  Fax: [facility] Follow up.   Why: Though you declined aftercare services, please reach out as needed for any behavioral health concerns. Thanks! Contact information: [place] [address] [phone]                 Next level of care provider has access to [place] and Suicide Prevention discussed: Yes,  completed with pt     Has patient been referred to the Quitline?: Patient refused referral  Patient has been referred for addiction treatment: N/A  [name] A [place], [place] 01/21/2021, 11:20 AM

## 2021-01-21 NOTE — Progress Notes (Signed)
Phone call from daughter, Vara Guardian, inquiring about discharge today.  Verbal consent obtained from patient to discuss with dgtr. Reviewed DC plan with understanding verbalized.

## 2021-01-21 NOTE — BHH Suicide Risk Assessment (Signed)
[facility] Discharge Suicide Risk Assessment   Principal Problem: Adjustment disorder with mixed disturbance of emotions and conduct Discharge Diagnoses: Principal Problem:   Adjustment disorder with mixed disturbance of emotions and conduct   Total Time spent with patient: 45 minutes  Musculoskeletal: Strength & Muscle Tone: within normal limits Gait & Station: unsteady Patient leans: N/A  Psychiatric Specialty Exam  Presentation  General Appearance: No data recorded Eye Contact:No data recorded Speech:No data recorded Speech Volume:No data recorded Handedness:No data recorded  Mood and Affect  Mood:No data recorded Duration of Depression Symptoms: Greater than two weeks  Affect:No data recorded  Thought Process  Thought Processes:No data recorded Descriptions of Associations:No data recorded Orientation:No data recorded Thought Content:No data recorded History of Schizophrenia/Schizoaffective disorder:No  Duration of Psychotic Symptoms:No data recorded Hallucinations:No data recorded Ideas of Reference:No data recorded Suicidal Thoughts:No data recorded Homicidal Thoughts:No data recorded  Sensorium  Memory:No data recorded Judgment:No data recorded Insight:No data recorded  Executive Functions  Concentration:No data recorded Attention Span:No data recorded Recall:No data recorded Fund of Knowledge:No data recorded Language:No data recorded  Psychomotor Activity  Psychomotor Activity:No data recorded  Assets  Assets:No data recorded  Sleep  Sleep:No data recorded  Physical Exam: Physical Exam Vitals and nursing note reviewed.  Constitutional:      Appearance: Normal appearance.  HENT:     Head: Normocephalic and atraumatic.     Mouth/Throat:     Pharynx: Oropharynx is clear.  Eyes:     Pupils: Pupils are equal, round, and reactive to light.  Cardiovascular:     Rate and Rhythm: Normal rate and regular rhythm.  Pulmonary:     Effort: Pulmonary  effort is normal.     Breath sounds: Normal breath sounds.  Abdominal:     General: Abdomen is flat.     Palpations: Abdomen is soft.  Musculoskeletal:        General: Normal range of motion.  Skin:    General: Skin is warm and dry.  Neurological:     General: No focal deficit present.     Mental Status: She is alert. Mental status is at baseline.  Psychiatric:        Mood and Affect: Mood normal.        Thought Content: Thought content normal.   Review of Systems  Constitutional: Negative.   HENT: Negative.    Eyes: Negative.   Respiratory: Negative.    Cardiovascular: Negative.   Gastrointestinal: Negative.   Musculoskeletal: Negative.   Skin: Negative.   Neurological: Negative.   Psychiatric/Behavioral: Negative.    Blood pressure 132/70, pulse 73, temperature 98.7 F (37.1 C), temperature source Oral, resp. rate 18, height 5' 3.75" (1.619 m), weight 66.2 kg, SpO2 99 %. Body mass index is 25.26 kg/m.  Mental Status Per Nursing Assessment::   On Admission:  NA  Demographic Factors:  Age 46 or older  Loss Factors: Decline in physical health  Historical Factors: Impulsivity  Risk Reduction Factors:   Sense of responsibility to family, Positive social support, Positive therapeutic relationship, and Positive coping skills or problem solving skills  Continued Clinical Symptoms:  Depression:   Impulsivity  Cognitive Features That Contribute To Risk:  None    Suicide Risk:  Minimal: No identifiable suicidal ideation.  Patients presenting with no risk factors but with morbid ruminations; may be classified as minimal risk based on the severity of the depressive symptoms   Follow-up [place] at [facility] Follow up.  Why: Though you declined aftercare services, please reach out as needed for any behavioral health concerns. Thanks! Contact information: [place], [place] Phone: [phone]   Behavioral Medicine: [phone]  Fax: [facility] Follow up.   Why: Though you declined aftercare services, please reach out as needed for any behavioral health concerns. Thanks! Contact information: [place] [address] [phone]                 Plan Of Care/Follow-up recommendations:  Patient is being discharged back to her assisted living facility.  No new medications were prescribed.  Follow-up is recommended for her for working on anxiety mood symptoms and irritability.  Patient at this point is completely denying suicidal ideation and appears to be upbeat and forward thinking and realistic.  No indication of acute dangerousness or requirement for further hospital stay.  Alethia Berthold, MD 01/21/2021, 11:23 AM

## 2021-01-21 NOTE — Care Management Important Message (Signed)
Important Message  Patient Details  Name: [name] MRN: [id number] Date of Birth: 19-Sep-1929   Medicare Important Message Given:  Yes     [name], Latanya Presser 01/21/2021, 8:39 AM

## 2021-01-21 NOTE — BHH Suicide Risk Assessment (Signed)
[facility] Admission Suicide Risk Assessment   Nursing information obtained from:  Patient Demographic factors:  Age 87 or older Current Mental Status:  NA Loss Factors:  NA Historical Factors:  NA Risk Reduction Factors:  NA  Total Time spent with patient: 45 minutes Principal Problem: Adjustment disorder with mixed disturbance of emotions and conduct Diagnosis:  Principal Problem:   Adjustment disorder with mixed disturbance of emotions and conduct  Subjective Data: Patient seen and chart reviewed.  Patient met with treatment team.  Patient continues to deny any suicidal thoughts whatsoever.  Reports that her mood is feeling good.  Focused on positive improvement in her health.  Affect upbeat and euthymic.  Neatly dressed and motivated.  Able to articulate plans for dealing with stress.  Continued Clinical Symptoms:  Alcohol Use Disorder Identification Test Final Score (AUDIT): 0 The "Alcohol Use Disorders Identification Test", Guidelines for Use in Primary Care, Second Edition.  World Pharmacologist [facility]). Score between 0-7:  no or low risk or alcohol related problems. Score between 8-15:  moderate risk of alcohol related problems. Score between 16-19:  high risk of alcohol related problems. Score 20 or above:  warrants further diagnostic evaluation for alcohol dependence and treatment.   CLINICAL FACTORS:   Depression:   Impulsivity   Musculoskeletal: Strength & Muscle Tone: decreased Gait & Station: unsteady Patient leans: N/A  Psychiatric Specialty Exam:  Presentation  General Appearance: No data recorded Eye Contact:No data recorded Speech:No data recorded Speech Volume:No data recorded Handedness:No data recorded  Mood and Affect  Mood:No data recorded Affect:No data recorded  Thought Process  Thought Processes:No data recorded Descriptions of Associations:No data recorded Orientation:No data recorded Thought Content:No data recorded History of  Schizophrenia/Schizoaffective disorder:No  Duration of Psychotic Symptoms:No data recorded Hallucinations:No data recorded Ideas of Reference:No data recorded Suicidal Thoughts:No data recorded Homicidal Thoughts:No data recorded  Sensorium  Memory:No data recorded Judgment:No data recorded Insight:No data recorded  Executive Functions  Concentration:No data recorded Attention Span:No data recorded Recall:No data recorded Fund of Knowledge:No data recorded Language:No data recorded  Psychomotor Activity  Psychomotor Activity:No data recorded  Assets  Assets:No data recorded  Sleep  Sleep:No data recorded   Physical Exam: Physical Exam Vitals and nursing note reviewed.  Constitutional:      Appearance: Normal appearance.  HENT:     Head: Normocephalic and atraumatic.     Mouth/Throat:     Pharynx: Oropharynx is clear.  Eyes:     Pupils: Pupils are equal, round, and reactive to light.  Cardiovascular:     Rate and Rhythm: Normal rate and regular rhythm.  Pulmonary:     Effort: Pulmonary effort is normal.     Breath sounds: Normal breath sounds.  Abdominal:     General: Abdomen is flat.     Palpations: Abdomen is soft.  Musculoskeletal:        General: Normal range of motion.       Legs:  Skin:    General: Skin is warm and dry.       Neurological:     General: No focal deficit present.     Mental Status: She is alert. Mental status is at baseline.  Psychiatric:        Attention and Perception: Attention normal.        Mood and Affect: Mood normal.        Speech: Speech normal.        Behavior: Behavior normal.        Thought  Content: Thought content normal.        Cognition and Memory: Cognition normal.        Judgment: Judgment normal.   Review of Systems  Constitutional: Negative.   HENT: Negative.    Eyes: Negative.   Respiratory: Negative.    Cardiovascular: Negative.   Gastrointestinal: Negative.   Musculoskeletal: Negative.   Skin:  Negative.   Neurological: Negative.   Psychiatric/Behavioral: Negative.    Blood pressure 132/70, pulse 73, temperature 98.7 F (37.1 C), temperature source Oral, resp. rate 18, height 5' 3.75" (1.619 m), weight 66.2 kg, SpO2 99 %. Body mass index is 25.26 kg/m.   COGNITIVE FEATURES THAT CONTRIBUTE TO RISK:  None    SUICIDE RISK:   Minimal: No identifiable suicidal ideation.  Patients presenting with no risk factors but with morbid ruminations; may be classified as minimal risk based on the severity of the depressive symptoms  PLAN OF CARE: Patient will be discharged home today.  She has declined to take antidepressant medicine.  We have talked about the risks and benefits of that and she is making a reasonable decision around it.  Patient at this point does not appear to be at elevated risk of suicide or self-harm and appears to be safe for outpatient treatment  I certify that inpatient services furnished can reasonably be expected to improve the patient's condition.   Alethia Berthold, MD 01/21/2021, 11:02 AM

## 2021-01-21 NOTE — Discharge Summary (Signed)
Physician Discharge Summary Note  Patient:  [name] is an 87 y.o., female MRN:  [id number] DOB:  [birth year] Patient phone:  [phone] (home)  Patient address:   [address] [place] [address],  Total Time spent with patient: 45 minutes  Date of Admission:  01/19/2021 Date of Discharge: 01/21/2021  Reason for Admission: Patient was admitted in transfer from [facility] because of concern about expressing suicidal ideation and briefly placing a bag over her head.  Principal Problem: Adjustment disorder with mixed disturbance of emotions and conduct Discharge Diagnoses: Principal Problem:   Adjustment disorder with mixed disturbance of emotions and conduct   Past Psychiatric History: Patient acknowledges a history of psychiatric treatment decades ago when she was under some stress involving her marriage.  She reports no psychiatric treatment or symptoms in the decade since then.  Denies any past suicide attempts.  Past Medical History:  Past Medical History:  Diagnosis Date   Anemia    CHRONIC   Aortic insufficiency    a. 2D echo 08/29/15: EF 55-60%, mild LVH, diastolic dysfunction, elevated LV filling pressure, mild AI, severe LAE, mild RAE, mild TR, dilated descending thoracic aorta just distal to the takeoff of the left subclavian, measuring 4.1 cm, no pericardial effusion.   Arthritis    OA-SOME BACK AND NECK PAIN,  GOES TO CHIROPRACTOR TWICE A MONTH;  HX OF JOINT REPLACEMENTS    Cancer ([place])    SKIN CANCERS REMOVED FROM LEGS   CKD (chronic kidney disease), stage II    Essential hypertension    Gait abnormality 09/08/2017   GERD (gastroesophageal reflux disease)    PSVT (paroxysmal supraventricular tachycardia) (HCC)    Recurrent Pericarditis    RECURRENT    Thoracic aneurysm    a. Followed by [facility] -last seen in 12/2013 w/ plan for f/u MRA and thoracic surgery f/u in 2 yrs, but patient does not wish to follow any longer.    Past Surgical History:   Procedure Laterality Date   APPENDECTOMY     BREAST EXCISIONAL BIOPSY Left 1992   benign   EXCISION/RELEASE BURSA HIP Left 11/24/2012   Procedure: LEFT HIP BURSECTOMY AND TENDON REPAIR ;  Surgeon: Gearlean Alf, MD;  Location: [facility];  Service: Orthopedics;  Laterality: Left;   HIP CLOSED REDUCTION Left 12/19/2012   Procedure: CLOSED MANIPULATION HIP;  Surgeon: Mauri Pole, MD;  Location: [facility];  Service: Orthopedics;  Laterality: Left;   HIP CLOSED REDUCTION Left 12/05/2013   Procedure: CLOSED MANIPULATION HIP;  Surgeon: Augustin Schooling, MD;  Location: [facility];  Service: Orthopedics;  Laterality: Left;   HIP CLOSED REDUCTION Left 09/22/2015   Procedure: CLOSED REDUCTION HIP;  Surgeon: Melina Schools, MD;  Location: [facility];  Service: Orthopedics;  Laterality: Left;   JOINT REPLACEMENT     LEFT TOTAL HIP REPLACEMENT AND REVISIONS X 2   OOPHORECTOMY     has a partial of one ovary remaingin   PELVIC LAPAROSCOPY     ovarian cyst removal,    REVISION TOTAL HIP ARTHROPLASTY     left   right Achilles Tendon repair  5/13   TOTAL KNEE ARTHROPLASTY     right   TOTAL KNEE ARTHROPLASTY     left   VAGINAL HYSTERECTOMY     with ovarian cyst removal   Family History:  Family History  Problem Relation Age of Onset   Hypertension Mother    Stroke Mother    Heart attack Father 62  ALS Other    Breast cancer Neg Hx    Family Psychiatric  History: Does not know of any Social History:  Social History   Substance and Sexual Activity  Alcohol Use No   Alcohol/week: 0.0 standard drinks     Social History   Substance and Sexual Activity  Drug Use No    Social History   Socioeconomic History   Marital status: Widowed    Spouse name: Not on file   Number of children: Not on file   Years of education: Not on file   Highest education level: Not on file  Occupational History   Not on file  Tobacco Use   Smoking status: Former    Types: Cigarettes   Smokeless tobacco: Never    Tobacco comments:    quit age 73  Vaping Use   Vaping Use: Never used  Substance and Sexual Activity   Alcohol use: No    Alcohol/week: 0.0 standard drinks   Drug use: No   Sexual activity: Not Currently    Birth control/protection: Surgical    Comment: HYST-1st intercourse 87 yo-Fewer than 5 partners  Other Topics Concern   Not on file  Social History Narrative   Not on file   Social Determinants of Health   Financial Resource Strain: Not on file  Food Insecurity: Not on file  Transportation Needs: Not on file  Physical Activity: Not on file  Stress: Not on file  Social Connections: Not on file    Hospital Course: Patient was admitted to the geriatric unit.  15-minute checks maintained.  Participated appropriately in evaluation and treatment team meeting.  Patient had been ordered low-dose Lexapro for depression.  She took it 1 day and said that she now does not want to take it anymore.  Says that she does not feel that she needs it because she is not having depressive symptoms.  We discussed the pros and cons of the medication and I encouraged her to consider giving it more of a try but she is declining.  Patient has capacity to make that decision.  She has consistently denied any suicidal ideation and has not shown any dangerous behavior.  At this point patient no longer meets commitment criteria and does not appear to require inpatient treatment.  Psychoeducation about depression completed.  Patient can be discharged back to her assisted living facility.  We have confirmed that she has no access to guns.  Physical Findings: AIMS:  , ,  ,  ,    CIWA:    COWS:     Musculoskeletal: Strength & Muscle Tone: decreased Gait & Station: unsteady Patient leans: N/A   Psychiatric Specialty Exam:  Presentation  General Appearance: No data recorded Eye Contact:No data recorded Speech:No data recorded Speech Volume:No data recorded Handedness:No data recorded  Mood and Affect   Mood:No data recorded Affect:No data recorded  Thought Process  Thought Processes:No data recorded Descriptions of Associations:No data recorded Orientation:No data recorded Thought Content:No data recorded History of Schizophrenia/Schizoaffective disorder:No  Duration of Psychotic Symptoms:No data recorded Hallucinations:No data recorded Ideas of Reference:No data recorded Suicidal Thoughts:No data recorded Homicidal Thoughts:No data recorded  Sensorium  Memory:No data recorded Judgment:No data recorded Insight:No data recorded  Executive Functions  Concentration:No data recorded Attention Span:No data recorded Recall:No data recorded Fund of Knowledge:No data recorded Language:No data recorded  Psychomotor Activity  Psychomotor Activity:No data recorded  Assets  Assets:No data recorded  Sleep  Sleep:No data recorded   Physical Exam:  Physical Exam Vitals and nursing note reviewed.  Constitutional:      Appearance: Normal appearance.  HENT:     Head: Normocephalic and atraumatic.     Mouth/Throat:     Pharynx: Oropharynx is clear.  Eyes:     Pupils: Pupils are equal, round, and reactive to light.  Cardiovascular:     Rate and Rhythm: Normal rate and regular rhythm.  Pulmonary:     Effort: Pulmonary effort is normal.     Breath sounds: Normal breath sounds.  Abdominal:     General: Abdomen is flat.     Palpations: Abdomen is soft.  Musculoskeletal:        General: Normal range of motion.  Skin:    General: Skin is warm and dry.  Neurological:     General: No focal deficit present.     Mental Status: She is alert. Mental status is at baseline.     Motor: Weakness present.     Comments: Patient still has some weakness and unsteadiness in her left leg but is safely ambulating with a walker  Psychiatric:        Mood and Affect: Mood normal.        Speech: Speech normal.        Behavior: Behavior normal.        Thought Content: Thought content normal.         Cognition and Memory: Cognition normal.        Judgment: Judgment normal.   Review of Systems  Constitutional: Negative.   HENT: Negative.    Eyes: Negative.   Respiratory: Negative.    Cardiovascular: Negative.   Gastrointestinal: Negative.   Musculoskeletal: Negative.   Skin: Negative.   Neurological: Negative.   Psychiatric/Behavioral: Negative.    Blood pressure 132/70, pulse 73, temperature 98.7 F (37.1 C), temperature source Oral, resp. rate 18, height 5' 3.75" (1.619 m), weight 66.2 kg, SpO2 99 %. Body mass index is 25.26 kg/m.   Social History   Tobacco Use  Smoking Status Former   Types: Cigarettes  Smokeless Tobacco Never  Tobacco Comments   quit age 76   Tobacco Cessation:  N/A, patient does not currently use tobacco products   Blood Alcohol level:  Lab Results  Component Value Date   ETH <10 11/57/2620    Metabolic Disorder Labs:  No results found for: HGBA1C, MPG No results found for: PROLACTIN No results found for: CHOL, TRIG, HDL, CHOLHDL, VLDL, LDLCALC  See Psychiatric Specialty Exam and Suicide Risk Assessment completed by Attending Physician prior to discharge.  Discharge destination:  ALF  Is patient on multiple antipsychotic therapies at discharge:  No   Has Patient had three or more failed trials of antipsychotic monotherapy by history:  No  Recommended Plan for Multiple Antipsychotic Therapies: NA  Discharge Instructions     Diet - low sodium heart healthy   Complete by: As directed    Increase activity slowly   Complete by: As directed       Allergies as of 01/21/2021       Reactions   Cephalexin Other (See Comments)   Pt states this causes eye problems and STROKE-LIKE SYMPTOMS!! Other reaction(s): stroke symptoms   Shellfish Allergy Hives, Rash   Shellfish-derived Products Hives, Rash   Other reaction(s): hives   Clindamycin/lincomycin Rash   Lincomycin Rash   Other    Other reaction(s): rash   Penicillamine     Sugar-protein-starch Other (See Comments)   Sugary foods cause  legs to cramp   Tape Other (See Comments)   Please use paper tape or Coban Wrap; patient's skin tears VERY easily!!   Ciprofloxacin Rash   Other reaction(s): rash   Sulfa Antibiotics Hives, Rash   Sulfites Hives, Rash   Other reaction(s): swelling        Medication List     TAKE these medications      Indication  amoxicillin 500 MG capsule Commonly known as: AMOXIL Take 2,000 mg by mouth See admin instructions. 1 hour prior to dental appointment    aspirin 81 MG tablet Take 81 mg by mouth every evening.    betamethasone dipropionate 0.05 % cream Apply topically 2 (two) times daily. Applied to the right ear canal twice daily for 4 to 5 days or until itching resolves.    budesonide 3 MG 24 hr capsule Commonly known as: ENTOCORT EC Take 3 mg by mouth daily.    CALCIUM 1200 PO Take 1 tablet by mouth every evening.    carboxymethylcellulose 0.5 % Soln Commonly known as: REFRESH PLUS Place 1 drop into both eyes in the morning and at bedtime.    carvedilol 12.5 MG tablet Commonly known as: COREG Take 1 tablet (12.5 mg total) by mouth 2 (two) times daily with a meal. 1/2 in the Am; 1 in the PM What changed:  when to take this additional instructions    cholecalciferol 1000 units tablet Commonly known as: VITAMIN D Take 1,000 Units by mouth daily.    clobetasol cream 0.05 % Commonly known as: TEMOVATE Apply 1 application topically 2 (two) times daily as needed (itching).    diltiazem 240 MG 24 hr capsule Commonly known as: CARDIZEM CD Take 1 capsule (240 mg total) by mouth daily. What changed: when to take this    estradiol 0.5 MG tablet Commonly known as: ESTRACE Take 1 tablet (0.5 mg total) by mouth daily.    ferrous sulfate 325 (65 FE) MG tablet Take 325 mg by mouth daily.    meloxicam 7.5 MG tablet Commonly known as: MOBIC Take 7.5 mg by mouth daily as needed for pain.    mupirocin  ointment 2 % Commonly known as: BACTROBAN Apply 1 application topically See admin instructions. Apply to leg after showers    omega-3 acid ethyl esters 1 g capsule Commonly known as: LOVAZA Take 1 g by mouth daily.    oxyCODONE-acetaminophen 5-325 MG tablet Commonly known as: PERCOCET/ROXICET Take 1 tablet by mouth every 6 (six) hours as needed for severe pain.    predniSONE 10 MG (21) Tbpk tablet Commonly known as: STERAPRED UNI-PAK 21 TAB Take 10 mg by mouth See admin instructions. 6,5,4,3,2,1    sodium chloride 1 g tablet Take 1 g by mouth daily.    tretinoin 0.1 % cream Commonly known as: RETIN-A Apply 1 application topically at bedtime as needed (breakouts).          Follow-up recommendations: Discharged to assisted living facility.  Continue medications as taking prior to admission.  Psychoeducation and supportive counseling completed.  Encourage patient to contact family and caregivers if mood and anxiety worsen  Comments: No prescriptions appear to be necessary at discharge  Signed: Alethia Berthold, MD 01/21/2021, 11:07 AM

## 2021-01-21 NOTE — Progress Notes (Signed)
Recreation Therapy Notes    Date: 01/21/2021  Time: 1:30pm   Location: Craft room  Behavioral response: Appropriate  Intervention Topic: Wellness   Discussion/Intervention:  Group content today was focused on Wellness. The group defined wellness and some positive ways they make decisions for themselves. Individuals expressed reasons why they neglected any wellness in the past. Patients described ways to improve wellness skills in the future. The group explained what could happen if they did not do any wellness at all. Participants express how bad choices has affected them and others around them. Individual explained the importance of wellness. The group participated in the intervention "Testing my Wellness" where they had a chance to identify some of their weaknesses and strengths in wellness.  Clinical Observations/Feedback: Patient came to group and defined wellness as taking good care of self. She stated that she like to exercise and eat right to participate in wellness. Participant explained that wellness is important to make sure you are taking care of yourself. Individual was social with staff while participating in the intervention.  Jonaya Freshour LRT/CTRS         Deryl Ports 01/21/2021 2:47 PM

## 2021-01-21 NOTE — [facility] Counselor (Signed)
CSW spoke with pt regarding after care planning at discharge. Pt stated that she was declining a follow up referral stating she is not interested in "starting up all that." She said she would receive help from the staff at her assisted living facility if any needs arose.   CSW left resources in pt's AVS as needed.   Danetta Prom [place], MSW, LCSW-A 11/9/202211:18 AM

## 2021-01-21 NOTE — NC FL2 (Signed)
[place] LEVEL OF CARE SCREENING TOOL     IDENTIFICATION  Patient Name: [name] Birthdate: [birth year] Sex: female Admission Date (Current Location): 01/19/2021  [facility] and [place] Number:  Engineering geologist and Address:  [facility], [address], [place], [place] [address]      Provider Number: [id number]  Attending Physician Name and Address:  Clapacs, Madie Reno, MD  Relative Name and Phone Number:       Current Level of Care: Hospital Recommended Level of Care: [place] Prior Approval Number:    Date Approved/Denied:   PASRR Number:    Discharge Plan: Other (Comment) (ALF, assisted living facility)    Current Diagnoses: Patient Active Problem List   Diagnosis Date Noted   Adjustment disorder with mixed disturbance of emotions and conduct 01/20/2021   Severe recurrent major depression without psychotic features ([place]) 01/19/2021   Pincer nail deformity 12/01/2020   Iron deficiency anemia 03/24/2020   Clavi 02/19/2020   Lumbar radiculopathy 06/17/2019   Pain due to onychomycosis of toenails of both feet 05/14/2019   Mixed incontinence 03/10/2019   Cervical spondylosis 06/07/2018   DDD (degenerative disc disease), cervical 05/20/2018   History of total knee replacement, left 04/13/2018   History of total knee replacement, right 04/13/2018   History of revision of total replacement of left hip joint 04/13/2018   Gait abnormality 09/08/2017   Chronic cystitis 07/05/2017   Dysuria 07/05/2017   Anemia 09/22/2015   h/o pericarditis    Essential hypertension    Aortic insufficiency    Posterior vitreous detachment, bilateral 06/05/2015   Left wrist pain 05/16/2015   Triquetral chip fracture, left, closed, initial encounter 05/16/2015   Primary osteoarthritis of first carpometacarpal joint of right hand 02/11/2015   Primary osteoarthritis of right wrist 02/11/2015   Arthropathy 11/25/2014    Subluxation 11/25/2014   Hip dislocation, left ([place]) 12/05/2013   Trochanteric bursitis of left hip 11/24/2012   Aneurysm of thoracic aorta ([place])    Nonexudative age-related macular degeneration 04/22/2011   Posterior capsular opacification, right 04/22/2011   Status post intraocular lens implant 04/22/2011    Orientation RESPIRATION BLADDER Height & Weight     Self, Time, Situation, Place  Normal Continent Weight: 146 lb (66.2 kg) Height:  5' 3.75" (161.9 cm)  BEHAVIORAL SYMPTOMS/MOOD NEUROLOGICAL BOWEL NUTRITION STATUS  Other (Comment) (N/A)  (None) Continent  (N/A)  AMBULATORY STATUS COMMUNICATION OF NEEDS Skin   Independent Verbally Normal, Bruising                       Personal Care Assistance Level of Assistance   (N/A)           Functional Limitations Info   (N/A)          SPECIAL CARE FACTORS FREQUENCY                       Contractures Contractures Info: Not present    Additional Factors Info  Code Status Code Status Info: DNR             Current Medications (01/21/2021):  This is the current hospital active medication list Current Facility-Administered Medications  Medication Dose Route Frequency Provider Last Rate Last Admin   acetaminophen (TYLENOL) tablet 650 mg  650 mg Oral Q6H PRN Clapacs, John T, MD       alum & mag hydroxide-simeth (MAALOX/MYLANTA) 200-200-20 MG/5ML suspension 30 mL  30  mL Oral Q4H PRN Clapacs, Madie Reno, MD       aspirin chewable tablet 81 mg  81 mg Oral QPM Clapacs, John T, MD   81 mg at 01/20/21 1700   budesonide (ENTOCORT EC) 24 hr capsule 3 mg  3 mg Oral Daily Clapacs, John T, MD   3 mg at 01/21/21 0911   carvedilol (COREG) tablet 12.5 mg  12.5 mg Oral QHS Clapacs, John T, MD   12.5 mg at 01/20/21 2142   carvedilol (COREG) tablet 6.25 mg  6.25 mg Oral Daily Clapacs, John T, MD   6.25 mg at 01/21/21 2297   diltiazem (CARDIZEM CD) 24 hr capsule 240 mg  240 mg Oral QPM Clapacs, John T, MD   240 mg at 01/20/21 1700    docusate sodium (COLACE) capsule 100 mg  100 mg Oral BID Clapacs, John T, MD   100 mg at 01/21/21 9892   estradiol (ESTRACE) tablet 0.5 mg  0.5 mg Oral Daily Clapacs, John T, MD   0.5 mg at 01/21/21 0910   ferrous sulfate tablet 325 mg  325 mg Oral Daily Clapacs, Madie Reno, MD   325 mg at 01/21/21 1194   LORazepam (ATIVAN) tablet 0.5 mg  0.5 mg Oral Once Caroline Sauger, NP       magnesium hydroxide (MILK OF MAGNESIA) suspension 30 mL  30 mL Oral Daily PRN Clapacs, John T, MD   30 mL at 01/20/21 1036   mupirocin ointment (BACTROBAN) 2 % 1 application  1 application Topical Daily Clapacs, Madie Reno, MD   1 application at 17/40/81 0912   polyvinyl alcohol (LIQUIFILM TEARS) 1.4 % ophthalmic solution 1 drop  1 drop Both Eyes PRN Clapacs, John T, MD       sodium chloride tablet 1 g  1 g Oral Daily Clapacs, Madie Reno, MD   1 g at 01/21/21 4481     Discharge Medications: Please see discharge summary for a list of discharge medications.  Relevant Imaging Results:  Relevant Lab Results:   Additional Information    Lillias Difrancesco A [place], LCSWA

## 2021-01-21 NOTE — Telephone Encounter (Signed)
Spoke with representative from [facility] and advised that the instructions should read:  Carvedilol 12.5mg - half tablet in the morning (6.25mg ) and whole tablet in the evening (12.5mg )  Representative appreciative for assistance.

## 2021-01-21 NOTE — Plan of Care (Signed)
Patient alert and oriented x 4.  Affect is pleasant and calm. Denies anxiety, SI/HI or AVH.  Patient states they will keep themselves safe when they return home.  Reviewed discharge instructions with patient including follow up appointment with provider and medication.  Questions answered and understanding verbalized.  Discharge packet given.  All belongings returned to patient after verification completed by staff.    Patient escorted by staff off unit at this time stable without complaint.  Problem: Education: Goal: Knowledge of [facility] Education information/materials will improve Outcome: Adequate for Discharge Goal: Emotional status will improve Outcome: Adequate for Discharge Goal: Mental status will improve Outcome: Adequate for Discharge Goal: Verbalization of understanding the information provided will improve Outcome: Adequate for Discharge   Problem: Activity: Goal: Interest or engagement in activities will improve Outcome: Adequate for Discharge Goal: Sleeping patterns will improve Outcome: Adequate for Discharge   Problem: Coping: Goal: Ability to verbalize frustrations and anger appropriately will improve Outcome: Adequate for Discharge Goal: Ability to demonstrate self-control will improve Outcome: Adequate for Discharge   Problem: Health Behavior/Discharge Planning: Goal: Identification of resources available to assist in meeting health care needs will improve Outcome: Adequate for Discharge Goal: Compliance with treatment plan for underlying cause of condition will improve Outcome: Adequate for Discharge   Problem: Physical Regulation: Goal: Ability to maintain clinical measurements within normal limits will improve Outcome: Adequate for Discharge   Problem: Safety: Goal: Periods of time without injury will increase Outcome: Adequate for Discharge   Problem: Education: Goal: Utilization of techniques to improve thought processes will  improve Outcome: Adequate for Discharge Goal: Knowledge of the prescribed therapeutic regimen will improve Outcome: Adequate for Discharge   Problem: Activity: Goal: Interest or engagement in leisure activities will improve Outcome: Adequate for Discharge Goal: Imbalance in normal sleep/wake cycle will improve Outcome: Adequate for Discharge   Problem: Coping: Goal: Coping ability will improve Outcome: Adequate for Discharge Goal: Will verbalize feelings Outcome: Adequate for Discharge   Problem: Health Behavior/Discharge Planning: Goal: Ability to make decisions will improve Outcome: Adequate for Discharge Goal: Compliance with therapeutic regimen will improve Outcome: Adequate for Discharge   Problem: Role Relationship: Goal: Will demonstrate positive changes in social behaviors and relationships Outcome: Adequate for Discharge   Problem: Safety: Goal: Ability to disclose and discuss suicidal ideas will improve Outcome: Adequate for Discharge Goal: Ability to identify and utilize support systems that promote safety will improve Outcome: Adequate for Discharge   Problem: Self-Concept: Goal: Will verbalize positive feelings about self Outcome: Adequate for Discharge Goal: Level of anxiety will decrease Outcome: Adequate for Discharge

## 2021-01-21 NOTE — BH IP Treatment Plan (Signed)
Interdisciplinary Treatment and Diagnostic Plan Update  01/21/2021 Time of Session: 10:30AM [name] MRN: [id number]  Principal Diagnosis: Adjustment disorder with mixed disturbance of emotions and conduct  Secondary Diagnoses: Principal Problem:   Adjustment disorder with mixed disturbance of emotions and conduct   Current Medications:  Current Facility-Administered Medications  Medication Dose Route Frequency Provider Last Rate Last Admin   acetaminophen (TYLENOL) tablet 650 mg  650 mg Oral Q6H PRN Clapacs, Madie Reno, MD       alum & mag hydroxide-simeth (MAALOX/MYLANTA) 200-200-20 MG/5ML suspension 30 mL  30 mL Oral Q4H PRN Clapacs, John T, MD       aspirin chewable tablet 81 mg  81 mg Oral QPM Clapacs, John T, MD   81 mg at 01/20/21 1700   budesonide (ENTOCORT EC) 24 hr capsule 3 mg  3 mg Oral Daily Clapacs, John T, MD   3 mg at 01/21/21 0911   carvedilol (COREG) tablet 12.5 mg  12.5 mg Oral QHS Clapacs, John T, MD   12.5 mg at 01/20/21 2142   carvedilol (COREG) tablet 6.25 mg  6.25 mg Oral Daily Clapacs, John T, MD   6.25 mg at 01/21/21 0086   diltiazem (CARDIZEM CD) 24 hr capsule 240 mg  240 mg Oral QPM Clapacs, John T, MD   240 mg at 01/20/21 1700   docusate sodium (COLACE) capsule 100 mg  100 mg Oral BID Clapacs, John T, MD   100 mg at 01/21/21 7619   estradiol (ESTRACE) tablet 0.5 mg  0.5 mg Oral Daily Clapacs, John T, MD   0.5 mg at 01/21/21 0910   ferrous sulfate tablet 325 mg  325 mg Oral Daily Clapacs, John T, MD   325 mg at 01/21/21 5093   LORazepam (ATIVAN) tablet 0.5 mg  0.5 mg Oral Once Caroline Sauger, NP       magnesium hydroxide (MILK OF MAGNESIA) suspension 30 mL  30 mL Oral Daily PRN Clapacs, John T, MD   30 mL at 01/20/21 1036   mupirocin ointment (BACTROBAN) 2 % 1 application  1 application Topical Daily Clapacs, Madie Reno, MD   1 application at 26/71/24 0912   polyvinyl alcohol (LIQUIFILM TEARS) 1.4 % ophthalmic solution 1 drop  1 drop Both Eyes PRN  Clapacs, John T, MD       sodium chloride tablet 1 g  1 g Oral Daily Clapacs, John T, MD   1 g at 01/21/21 0912   PTA Medications: Medications Prior to Admission  Medication Sig Dispense Refill Last Dose   amoxicillin (AMOXIL) 500 MG capsule Take 2,000 mg by mouth See admin instructions. 1 hour prior to dental appointment      aspirin 81 MG tablet Take 81 mg by mouth every evening.      betamethasone dipropionate 0.05 % cream Apply topically 2 (two) times daily. Applied to the right ear canal twice daily for 4 to 5 days or until itching resolves. (Patient not taking: No sig reported) 30 g 0    budesonide (ENTOCORT EC) 3 MG 24 hr capsule Take 3 mg by mouth daily.      Calcium Carbonate-Vit D-Min (CALCIUM 1200 PO) Take 1 tablet by mouth every evening.      carboxymethylcellulose (REFRESH PLUS) 0.5 % SOLN Place 1 drop into both eyes in the morning and at bedtime.      carvedilol (COREG) 12.5 MG tablet Take 1 tablet (12.5 mg total) by mouth 2 (two) times daily with a meal. 1/2  in the Am; 1 in the PM (Patient taking differently: Take 12.5 mg by mouth See admin instructions. 6.25 mg in the morning. 12.5 mg in the evening) 180 tablet 1    cholecalciferol (VITAMIN D) 1000 UNITS tablet Take 1,000 Units by mouth daily.      clobetasol cream (TEMOVATE) 8.75 % Apply 1 application topically 2 (two) times daily as needed (itching).       diltiazem (CARDIZEM CD) 240 MG 24 hr capsule Take 1 capsule (240 mg total) by mouth daily. (Patient taking differently: Take 240 mg by mouth every evening.) 90 capsule 3    estradiol (ESTRACE) 0.5 MG tablet Take 1 tablet (0.5 mg total) by mouth daily. 90 tablet 0    ferrous sulfate 325 (65 FE) MG tablet Take 325 mg by mouth daily.      meloxicam (MOBIC) 7.5 MG tablet Take 7.5 mg by mouth daily as needed for pain.       mupirocin ointment (BACTROBAN) 2 % Apply 1 application topically See admin instructions. Apply to leg after showers      omega-3 acid ethyl esters (LOVAZA) 1 g  capsule Take 1 g by mouth daily.       oxyCODONE-acetaminophen (PERCOCET/ROXICET) 5-325 MG tablet Take 1 tablet by mouth every 6 (six) hours as needed for severe pain. 10 tablet 0    predniSONE (STERAPRED UNI-PAK 21 TAB) 10 MG (21) TBPK tablet Take 10 mg by mouth See admin instructions. 6,5,4,3,2,1 (Patient not taking: Reported on 01/16/2021)      sodium chloride 1 g tablet Take 1 g by mouth daily.      tretinoin (RETIN-A) 0.1 % cream Apply 1 application topically at bedtime as needed (breakouts).       Patient Stressors: Loss of quality of life    Patient Strengths: Ability for insight  Active sense of humor  Communication skills  Financial means  Supportive family/friends   Treatment Modalities: Medication Management, Group therapy, Case management,  1 to 1 session with clinician, Psychoeducation, Recreational therapy.   Physician Treatment Plan for Primary Diagnosis: Adjustment disorder with mixed disturbance of emotions and conduct Long Term Goal(s): Improvement in symptoms so as ready for discharge   Short Term Goals: Ability to identify and develop effective coping behaviors will improve Ability to verbalize feelings will improve Ability to disclose and discuss suicidal ideas  Medication Management: Evaluate patient's response, side effects, and tolerance of medication regimen.  Therapeutic Interventions: 1 to 1 sessions, Unit Group sessions and Medication administration.  Evaluation of Outcomes: Adequate for Discharge  Physician Treatment Plan for Secondary Diagnosis: Principal Problem:   Adjustment disorder with mixed disturbance of emotions and conduct  Long Term Goal(s): Improvement in symptoms so as ready for discharge   Short Term Goals: Ability to identify and develop effective coping behaviors will improve Ability to verbalize feelings will improve Ability to disclose and discuss suicidal ideas     Medication Management: Evaluate patient's response, side effects,  and tolerance of medication regimen.  Therapeutic Interventions: 1 to 1 sessions, Unit Group sessions and Medication administration.  Evaluation of Outcomes: Adequate for Discharge   RN Treatment Plan for Primary Diagnosis: Adjustment disorder with mixed disturbance of emotions and conduct Long Term Goal(s): Knowledge of disease and therapeutic regimen to maintain health will improve  Short Term Goals: Ability to remain free from injury will improve, Ability to verbalize frustration and anger appropriately will improve, Ability to demonstrate self-control, Ability to participate in decision making will improve, Ability to  verbalize feelings will improve, Ability to disclose and discuss suicidal ideas, Ability to identify and develop effective coping behaviors will improve, and Compliance with prescribed medications will improve  Medication Management: RN will administer medications as ordered by provider, will assess and evaluate patient's response and provide education to patient for prescribed medication. RN will report any adverse and/or side effects to prescribing provider.  Therapeutic Interventions: 1 on 1 counseling sessions, Psychoeducation, Medication administration, Evaluate responses to treatment, Monitor vital signs and CBGs as ordered, Perform/monitor CIWA, COWS, AIMS and Fall Risk screenings as ordered, Perform wound care treatments as ordered.  Evaluation of Outcomes: Adequate for Discharge   LCSW Treatment Plan for Primary Diagnosis: Adjustment disorder with mixed disturbance of emotions and conduct Long Term Goal(s): Safe transition to appropriate next level of care at discharge, Engage patient in therapeutic group addressing interpersonal concerns.  Short Term Goals: Engage patient in aftercare planning with referrals and resources, Increase social support, Increase ability to appropriately verbalize feelings, Increase emotional regulation, Facilitate acceptance of mental  health diagnosis and concerns, and Identify triggers associated with mental health/substance abuse issues  Therapeutic Interventions: Assess for all discharge needs, 1 to 1 time with Social worker, Explore available resources and support systems, Assess for adequacy in community support network, Educate family and significant other(s) on suicide prevention, Complete Psychosocial Assessment, Interpersonal group therapy.  Evaluation of Outcomes: Adequate for Discharge   Progress in Treatment: Attending groups: Yes. Participating in groups: Yes. Taking medication as prescribed: No. Toleration medication: Yes. Family/Significant other contact made: No, will contact:  pt declined permission to contact collateral Patient understands diagnosis: Yes. Discussing patient identified problems/goals with staff: Yes. Medical problems stabilized or resolved: Yes. Denies suicidal/homicidal ideation: Yes. Issues/concerns per patient self-inventory: No. Other: None  New problem(s) identified: No, Describe:  None  New Short Term/Long Term Goal(s): elimination of symptoms of psychosis, medication management for mood stabilization; elimination of SI thoughts; development of comprehensive mental wellness plan.   Patient Goals: "Get back to the lifestyle I had there."  Discharge Plan or Barriers: CSW will assist pt in development of appropriate discharge plan. Patient will be returning back to her assisted living facility and CSW will assist with arrangement of transportation.   Reason for Continuation of Hospitalization: Depression Medication stabilization  Estimated Length of Stay: 1- 7 days   Scribe for Treatment Team: Seon Gaertner A [place], Latanya Presser 01/21/2021 1:59 PM

## 2021-01-23 DIAGNOSIS — R45851 Suicidal ideations: Secondary | ICD-10-CM | POA: Diagnosis not present

## 2021-01-23 DIAGNOSIS — Z9181 History of falling: Secondary | ICD-10-CM | POA: Diagnosis not present

## 2021-01-26 ENCOUNTER — Other Ambulatory Visit: Payer: Self-pay | Admitting: Internal Medicine

## 2021-01-26 ENCOUNTER — Other Ambulatory Visit: Payer: Self-pay

## 2021-01-26 ENCOUNTER — Emergency Department (HOSPITAL_COMMUNITY)
Admission: EM | Admit: 2021-01-26 | Discharge: 2021-01-27 | Disposition: A | Discharge status: Home / Self Care | Payer: Medicare Other | Attending: Emergency Medicine | Admitting: Emergency Medicine

## 2021-01-26 ENCOUNTER — Encounter (HOSPITAL_COMMUNITY): Payer: Self-pay | Admitting: Emergency Medicine

## 2021-01-26 DIAGNOSIS — M79605 Pain in left leg: Secondary | ICD-10-CM | POA: Diagnosis not present

## 2021-01-26 DIAGNOSIS — Z5321 Procedure and treatment not carried out due to patient leaving prior to being seen by health care provider: Secondary | ICD-10-CM | POA: Diagnosis not present

## 2021-01-26 DIAGNOSIS — Z20828 Contact with and (suspected) exposure to other viral communicable diseases: Secondary | ICD-10-CM | POA: Diagnosis not present

## 2021-01-26 DIAGNOSIS — M25562 Pain in left knee: Secondary | ICD-10-CM | POA: Diagnosis not present

## 2021-01-26 DIAGNOSIS — D509 Iron deficiency anemia, unspecified: Secondary | ICD-10-CM | POA: Diagnosis not present

## 2021-01-26 DIAGNOSIS — R5383 Other fatigue: Secondary | ICD-10-CM | POA: Diagnosis not present

## 2021-01-26 DIAGNOSIS — M7989 Other specified soft tissue disorders: Secondary | ICD-10-CM | POA: Diagnosis not present

## 2021-01-26 DIAGNOSIS — R531 Weakness: Secondary | ICD-10-CM | POA: Diagnosis not present

## 2021-01-26 DIAGNOSIS — E871 Hypo-osmolality and hyponatremia: Secondary | ICD-10-CM | POA: Diagnosis not present

## 2021-01-26 DIAGNOSIS — M2550 Pain in unspecified joint: Secondary | ICD-10-CM | POA: Diagnosis not present

## 2021-01-26 DIAGNOSIS — R7889 Finding of other specified substances, not normally found in blood: Secondary | ICD-10-CM | POA: Diagnosis not present

## 2021-01-26 DIAGNOSIS — R5382 Chronic fatigue, unspecified: Secondary | ICD-10-CM | POA: Diagnosis not present

## 2021-01-26 LAB — BASIC METABOLIC PANEL
Anion gap: 8 (ref 5–15)
BUN: 28 mg/dL — ABNORMAL HIGH (ref 8–23)
CO2: 23 mmol/L (ref 22–32)
Calcium: 8.6 mg/dL — ABNORMAL LOW (ref 8.9–10.3)
Chloride: 100 mmol/L (ref 98–111)
Creatinine, Ser: 1.32 mg/dL — ABNORMAL HIGH (ref 0.44–1.00)
GFR, Estimated: 39 mL/min — ABNORMAL LOW (ref >60)
Glucose, Bld: 112 mg/dL — ABNORMAL HIGH (ref 70–99)
Potassium: 4.5 mmol/L (ref 3.5–5.1)
Sodium: 131 mmol/L — ABNORMAL LOW (ref 135–145)

## 2021-01-26 LAB — TYPE AND SCREEN
ABO/RH(D): AB POS
Antibody Screen: NEGATIVE

## 2021-01-26 LAB — CBC WITH DIFFERENTIAL/PLATELET
Abs Immature Granulocytes: 0.06 10*3/uL (ref 0.00–0.07)
Basophils Absolute: 0.1 10*3/uL (ref 0.0–0.1)
Basophils Relative: 2 %
Eosinophils Absolute: 0.2 10*3/uL (ref 0.0–0.5)
Eosinophils Relative: 2 %
HCT: 24.2 % — ABNORMAL LOW (ref 36.0–46.0)
Hemoglobin: 7.5 g/dL — ABNORMAL LOW (ref 12.0–15.0)
Immature Granulocytes: 1 %
Lymphocytes Relative: 25 %
Lymphs Abs: 2.2 10*3/uL (ref 0.7–4.0)
MCH: 29.3 pg (ref 26.0–34.0)
MCHC: 31 g/dL (ref 30.0–36.0)
MCV: 94.5 fL (ref 80.0–100.0)
Monocytes Absolute: 0.9 10*3/uL (ref 0.1–1.0)
Monocytes Relative: 10 %
Neutro Abs: 5.3 10*3/uL (ref 1.7–7.7)
Neutrophils Relative %: 60 %
Platelets: 253 10*3/uL (ref 150–400)
RBC: 2.56 MIL/uL — ABNORMAL LOW (ref 3.87–5.11)
RDW: 14.8 % (ref 11.5–15.5)
WBC: 8.7 10*3/uL (ref 4.0–10.5)
nRBC: 0 % (ref 0.0–0.2)

## 2021-01-26 NOTE — ED Provider Notes (Signed)
Emergency Medicine Provider Triage Evaluation Note  [name] , a 87 y.o. female  was evaluated in triage.  Pt complains of weakness x2 weeks.  Patient was seen at her primary care and was found to have a hemoglobin of 7.3.  She states that she has been fatigued and has had difficulty walking.  She also reports several falls in the past week, had head imaging done that was normal.  She reports history of anemia, is on chronic iron supplementation.  Review of Systems  Positive: Weakness, fatigue Negative: Lightheadedness, dizziness, chest pain, shortness of breath  Physical Exam  BP (!) 172/72   Pulse 71   Temp 97.8 F (36.6 C) (Oral)   Resp 16   SpO2 100%  Gen:   Awake, no distress   Resp:  Normal effort  MSK:   Moves extremities without difficulty  Other:  Bruising noted to the face and the cheeks with no obvious palpated deformity  Medical Decision Making  Medically screening exam initiated at 8:20 PM.  Appropriate orders placed.  [name] was informed that the remainder of the evaluation will be completed by another provider, this initial triage assessment does not replace that evaluation, and the importance of remaining in the ED until their evaluation is complete.     Estill Cotta 01/26/21 2021    Hayden Rasmussen, MD 01/26/21 [phone]

## 2021-01-26 NOTE — ED Triage Notes (Signed)
PCP called patient and stated that her Hgb was low at 7.3.  She has been fatigued and has been having a hard time walking.  VSS.

## 2021-01-27 ENCOUNTER — Ambulatory Visit
Admission: RE | Admit: 2021-01-27 | Discharge: 2021-01-27 | Disposition: A | Discharge status: Home / Self Care | Payer: Medicare Other | Source: Ambulatory Visit | Attending: Internal Medicine | Admitting: Internal Medicine

## 2021-01-27 DIAGNOSIS — R600 Localized edema: Secondary | ICD-10-CM | POA: Diagnosis not present

## 2021-01-27 DIAGNOSIS — M7989 Other specified soft tissue disorders: Secondary | ICD-10-CM

## 2021-01-27 NOTE — ED Notes (Signed)
Pt family member states he is taking the patient home.

## 2021-01-29 DIAGNOSIS — D509 Iron deficiency anemia, unspecified: Secondary | ICD-10-CM | POA: Diagnosis not present

## 2021-01-29 DIAGNOSIS — M79605 Pain in left leg: Secondary | ICD-10-CM | POA: Diagnosis not present

## 2021-01-29 DIAGNOSIS — M7989 Other specified soft tissue disorders: Secondary | ICD-10-CM | POA: Diagnosis not present

## 2021-01-29 DIAGNOSIS — E871 Hypo-osmolality and hyponatremia: Secondary | ICD-10-CM | POA: Diagnosis not present

## 2021-02-02 DIAGNOSIS — Z20822 Contact with and (suspected) exposure to covid-19: Secondary | ICD-10-CM | POA: Diagnosis not present

## 2021-02-03 DIAGNOSIS — M2550 Pain in unspecified joint: Secondary | ICD-10-CM | POA: Diagnosis not present

## 2021-02-03 DIAGNOSIS — R5383 Other fatigue: Secondary | ICD-10-CM | POA: Diagnosis not present

## 2021-02-03 DIAGNOSIS — D649 Anemia, unspecified: Secondary | ICD-10-CM | POA: Diagnosis not present

## 2021-02-09 ENCOUNTER — Other Ambulatory Visit: Payer: Self-pay

## 2021-02-09 ENCOUNTER — Ambulatory Visit (INDEPENDENT_AMBULATORY_CARE_PROVIDER_SITE_OTHER): Payer: Medicare Other | Admitting: Podiatry

## 2021-02-09 ENCOUNTER — Encounter: Payer: Self-pay | Admitting: Podiatry

## 2021-02-09 DIAGNOSIS — B351 Tinea unguium: Secondary | ICD-10-CM

## 2021-02-09 DIAGNOSIS — M79675 Pain in left toe(s): Secondary | ICD-10-CM | POA: Diagnosis not present

## 2021-02-09 DIAGNOSIS — M79674 Pain in right toe(s): Secondary | ICD-10-CM | POA: Diagnosis not present

## 2021-02-09 DIAGNOSIS — Q828 Other specified congenital malformations of skin: Secondary | ICD-10-CM

## 2021-02-09 DIAGNOSIS — M2031 Hallux varus (acquired), right foot: Secondary | ICD-10-CM

## 2021-02-09 DIAGNOSIS — Z20828 Contact with and (suspected) exposure to other viral communicable diseases: Secondary | ICD-10-CM | POA: Diagnosis not present

## 2021-02-09 DIAGNOSIS — L608 Other nail disorders: Secondary | ICD-10-CM

## 2021-02-09 DIAGNOSIS — Z1159 Encounter for screening for other viral diseases: Secondary | ICD-10-CM | POA: Diagnosis not present

## 2021-02-09 NOTE — Progress Notes (Signed)
This patient returns to the office for evaluation and treatment of long thick painful nails .  This patient is unable to trim his own nails since the patient cannot reach his feet.  Patient says the nails are painful walking and wearing his shoes.   She has painful callus on the bottom of her right big toe. She returns for preventive foot care services.  General Appearance  Alert, conversant and in no acute stress.  Vascular  Dorsalis pedis and posterior tibial  pulses are palpable  bilaterally.  Capillary return is within normal limits  bilaterally. Temperature is within normal limits  bilaterally.  Neurologic  Senn-Weinstein monofilament wire test within normal limits  bilaterally. Muscle power within normal limits bilaterally.  Nails Thick disfigured discolored nails with subungual debris  from hallux to fifth toes bilaterally. No evidence of bacterial infection or drainage bilaterally. Pincer fourth toenails  B/l.  Orthopedic  No limitations of motion  feet .  No crepitus or effusions noted.  No bony pathology or digital deformities noted. 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  Skin  normotropic  bilaterally.  No signs of infections or ulcers noted.   Symptomatic callus right hallux.  Clavi 3rd toe left foot. Asymptomatic.  Onychomycosis  Pain in toes right foot  Pain in toes left foot  Callus right foot.  Debridement  of nails  1-5  B/L with a nail nipper.  Nails were then filed using a dremel tool with no incidents.      RTC 9 weeks     Gardiner Barefoot DPM

## 2021-02-10 ENCOUNTER — Other Ambulatory Visit: Payer: Self-pay

## 2021-02-10 DIAGNOSIS — Z9181 History of falling: Secondary | ICD-10-CM | POA: Diagnosis not present

## 2021-02-10 DIAGNOSIS — M6281 Muscle weakness (generalized): Secondary | ICD-10-CM | POA: Diagnosis not present

## 2021-02-10 DIAGNOSIS — R296 Repeated falls: Secondary | ICD-10-CM | POA: Diagnosis not present

## 2021-02-10 DIAGNOSIS — R260 Ataxic gait: Secondary | ICD-10-CM | POA: Diagnosis not present

## 2021-02-10 MED ORDER — DILTIAZEM HCL ER COATED BEADS 240 MG PO CP24: 240.0000 mg | ORAL_CAPSULE | Freq: Every day | ORAL | 1 refills | Status: DC

## 2021-02-11 ENCOUNTER — Telehealth: Payer: Self-pay | Admitting: Hematology

## 2021-02-11 DIAGNOSIS — M9901 Segmental and somatic dysfunction of cervical region: Secondary | ICD-10-CM | POA: Diagnosis not present

## 2021-02-11 DIAGNOSIS — M4726 Other spondylosis with radiculopathy, lumbar region: Secondary | ICD-10-CM | POA: Diagnosis not present

## 2021-02-11 DIAGNOSIS — M9903 Segmental and somatic dysfunction of lumbar region: Secondary | ICD-10-CM | POA: Diagnosis not present

## 2021-02-11 DIAGNOSIS — M9902 Segmental and somatic dysfunction of thoracic region: Secondary | ICD-10-CM | POA: Diagnosis not present

## 2021-02-11 NOTE — Telephone Encounter (Signed)
Scheduled appt per 11/30 referral. Pt was an established pt of Dr. Grier Mitts. She is aware of appt date and time.

## 2021-02-12 DIAGNOSIS — M6281 Muscle weakness (generalized): Secondary | ICD-10-CM | POA: Diagnosis not present

## 2021-02-12 DIAGNOSIS — R260 Ataxic gait: Secondary | ICD-10-CM | POA: Diagnosis not present

## 2021-02-12 DIAGNOSIS — Z9181 History of falling: Secondary | ICD-10-CM | POA: Diagnosis not present

## 2021-02-12 DIAGNOSIS — R296 Repeated falls: Secondary | ICD-10-CM | POA: Diagnosis not present

## 2021-02-16 DIAGNOSIS — Z9181 History of falling: Secondary | ICD-10-CM | POA: Diagnosis not present

## 2021-02-16 DIAGNOSIS — Z20828 Contact with and (suspected) exposure to other viral communicable diseases: Secondary | ICD-10-CM | POA: Diagnosis not present

## 2021-02-16 DIAGNOSIS — Z1159 Encounter for screening for other viral diseases: Secondary | ICD-10-CM | POA: Diagnosis not present

## 2021-02-16 DIAGNOSIS — R260 Ataxic gait: Secondary | ICD-10-CM | POA: Diagnosis not present

## 2021-02-16 DIAGNOSIS — M6281 Muscle weakness (generalized): Secondary | ICD-10-CM | POA: Diagnosis not present

## 2021-02-16 DIAGNOSIS — R296 Repeated falls: Secondary | ICD-10-CM | POA: Diagnosis not present

## 2021-02-17 ENCOUNTER — Inpatient Hospital Stay: Payer: Medicare Other

## 2021-02-17 ENCOUNTER — Other Ambulatory Visit: Payer: Self-pay

## 2021-02-17 ENCOUNTER — Inpatient Hospital Stay: Payer: Medicare Other | Attending: Hematology | Admitting: Hematology

## 2021-02-17 VITALS — BP 137/61 | HR 66 | Temp 98.1°F | Resp 17 | Ht 63.75 in | Wt 150.6 lb

## 2021-02-17 DIAGNOSIS — Z87891 Personal history of nicotine dependence: Secondary | ICD-10-CM | POA: Diagnosis not present

## 2021-02-17 DIAGNOSIS — Z96653 Presence of artificial knee joint, bilateral: Secondary | ICD-10-CM | POA: Diagnosis not present

## 2021-02-17 DIAGNOSIS — D509 Iron deficiency anemia, unspecified: Secondary | ICD-10-CM

## 2021-02-17 DIAGNOSIS — I129 Hypertensive chronic kidney disease with stage 1 through stage 4 chronic kidney disease, or unspecified chronic kidney disease: Secondary | ICD-10-CM | POA: Diagnosis not present

## 2021-02-17 DIAGNOSIS — D631 Anemia in chronic kidney disease: Secondary | ICD-10-CM | POA: Insufficient documentation

## 2021-02-17 DIAGNOSIS — D649 Anemia, unspecified: Secondary | ICD-10-CM

## 2021-02-17 DIAGNOSIS — D638 Anemia in other chronic diseases classified elsewhere: Secondary | ICD-10-CM | POA: Diagnosis not present

## 2021-02-17 DIAGNOSIS — E611 Iron deficiency: Secondary | ICD-10-CM | POA: Insufficient documentation

## 2021-02-17 DIAGNOSIS — F32A Depression, unspecified: Secondary | ICD-10-CM | POA: Diagnosis not present

## 2021-02-17 DIAGNOSIS — Z79890 Hormone replacement therapy (postmenopausal): Secondary | ICD-10-CM | POA: Insufficient documentation

## 2021-02-17 DIAGNOSIS — Z90710 Acquired absence of both cervix and uterus: Secondary | ICD-10-CM | POA: Diagnosis not present

## 2021-02-17 DIAGNOSIS — E538 Deficiency of other specified B group vitamins: Secondary | ICD-10-CM

## 2021-02-17 DIAGNOSIS — N182 Chronic kidney disease, stage 2 (mild): Secondary | ICD-10-CM | POA: Diagnosis not present

## 2021-02-17 DIAGNOSIS — Z96642 Presence of left artificial hip joint: Secondary | ICD-10-CM | POA: Insufficient documentation

## 2021-02-17 DIAGNOSIS — M069 Rheumatoid arthritis, unspecified: Secondary | ICD-10-CM | POA: Insufficient documentation

## 2021-02-17 LAB — CBC WITH DIFFERENTIAL (CANCER CENTER ONLY)
Abs Immature Granulocytes: 0.05 10*3/uL (ref 0.00–0.07)
Basophils Absolute: 0.2 10*3/uL — ABNORMAL HIGH (ref 0.0–0.1)
Basophils Relative: 2 %
Eosinophils Absolute: 0.3 10*3/uL (ref 0.0–0.5)
Eosinophils Relative: 4 %
HCT: 27.4 % — ABNORMAL LOW (ref 36.0–46.0)
Hemoglobin: 8.7 g/dL — ABNORMAL LOW (ref 12.0–15.0)
Immature Granulocytes: 1 %
Lymphocytes Relative: 27 %
Lymphs Abs: 2.4 10*3/uL (ref 0.7–4.0)
MCH: 29.5 pg (ref 26.0–34.0)
MCHC: 31.8 g/dL (ref 30.0–36.0)
MCV: 92.9 fL (ref 80.0–100.0)
Monocytes Absolute: 0.9 10*3/uL (ref 0.1–1.0)
Monocytes Relative: 11 %
Neutro Abs: 4.8 10*3/uL (ref 1.7–7.7)
Neutrophils Relative %: 55 %
Platelet Count: 278 10*3/uL (ref 150–400)
RBC: 2.95 MIL/uL — ABNORMAL LOW (ref 3.87–5.11)
RDW: 14.6 % (ref 11.5–15.5)
WBC Count: 8.6 10*3/uL (ref 4.0–10.5)
nRBC: 0 % (ref 0.0–0.2)

## 2021-02-17 LAB — SAMPLE TO BLOOD BANK

## 2021-02-17 LAB — FERRITIN: Ferritin: 462 ng/mL — ABNORMAL HIGH (ref 11–307)

## 2021-02-17 LAB — VITAMIN B12: Vitamin B-12: 520 pg/mL (ref 180–914)

## 2021-02-17 LAB — CMP (CANCER CENTER ONLY)
ALT: 16 U/L (ref 0–44)
AST: 24 U/L (ref 15–41)
Albumin: 3.8 g/dL (ref 3.5–5.0)
Alkaline Phosphatase: 55 U/L (ref 38–126)
Anion gap: 10 (ref 5–15)
BUN: 37 mg/dL — ABNORMAL HIGH (ref 8–23)
CO2: 22 mmol/L (ref 22–32)
Calcium: 8.7 mg/dL — ABNORMAL LOW (ref 8.9–10.3)
Chloride: 103 mmol/L (ref 98–111)
Creatinine: 1.14 mg/dL — ABNORMAL HIGH (ref 0.44–1.00)
GFR, Estimated: 46 mL/min — ABNORMAL LOW (ref >60)
Glucose, Bld: 89 mg/dL (ref 70–99)
Potassium: 4.8 mmol/L (ref 3.5–5.1)
Sodium: 135 mmol/L (ref 135–145)
Total Bilirubin: 0.4 mg/dL (ref 0.3–1.2)
Total Protein: 7.5 g/dL (ref 6.5–8.1)

## 2021-02-17 LAB — IRON AND TIBC
Iron: 60 ug/dL (ref 41–142)
Saturation Ratios: 19 % — ABNORMAL LOW (ref 21–57)
TIBC: 308 ug/dL (ref 236–444)
UIBC: 248 ug/dL (ref 120–384)

## 2021-02-17 LAB — LACTATE DEHYDROGENASE: LDH: 290 U/L — ABNORMAL HIGH (ref 98–192)

## 2021-02-18 DIAGNOSIS — R296 Repeated falls: Secondary | ICD-10-CM | POA: Diagnosis not present

## 2021-02-18 DIAGNOSIS — R260 Ataxic gait: Secondary | ICD-10-CM | POA: Diagnosis not present

## 2021-02-18 DIAGNOSIS — Z9181 History of falling: Secondary | ICD-10-CM | POA: Diagnosis not present

## 2021-02-18 DIAGNOSIS — M6281 Muscle weakness (generalized): Secondary | ICD-10-CM | POA: Diagnosis not present

## 2021-02-18 LAB — FOLATE RBC
Folate, Hemolysate: >620 ng/mL
Folate, RBC: >2199 ng/mL (ref >498)
Hematocrit: 28.2 % — ABNORMAL LOW (ref 34.0–46.6)

## 2021-02-18 LAB — HAPTOGLOBIN: Haptoglobin: 154 mg/dL (ref 41–333)

## 2021-02-20 LAB — MULTIPLE MYELOMA PANEL, SERUM
Albumin SerPl Elph-Mcnc: 3.8 g/dL (ref 2.9–4.4)
Albumin/Glob SerPl: 1.2 (ref 0.7–1.7)
Alpha 1: 0.3 g/dL (ref 0.0–0.4)
Alpha2 Glob SerPl Elph-Mcnc: 0.9 g/dL (ref 0.4–1.0)
B-Globulin SerPl Elph-Mcnc: 1.1 g/dL (ref 0.7–1.3)
Gamma Glob SerPl Elph-Mcnc: 1.3 g/dL (ref 0.4–1.8)
Globulin, Total: 3.4 g/dL (ref 2.2–3.9)
IgA: 274 mg/dL (ref 64–422)
IgG (Immunoglobin G), Serum: 1263 mg/dL (ref 586–1602)
IgM (Immunoglobulin M), Srm: 100 mg/dL (ref 26–217)
Total Protein ELP: 7.2 g/dL (ref 6.0–8.5)

## 2021-02-23 ENCOUNTER — Encounter: Payer: Self-pay | Admitting: Hematology

## 2021-02-23 DIAGNOSIS — Z9181 History of falling: Secondary | ICD-10-CM | POA: Diagnosis not present

## 2021-02-23 DIAGNOSIS — M6281 Muscle weakness (generalized): Secondary | ICD-10-CM | POA: Diagnosis not present

## 2021-02-23 DIAGNOSIS — R296 Repeated falls: Secondary | ICD-10-CM | POA: Diagnosis not present

## 2021-02-23 DIAGNOSIS — Z20828 Contact with and (suspected) exposure to other viral communicable diseases: Secondary | ICD-10-CM | POA: Diagnosis not present

## 2021-02-23 DIAGNOSIS — R260 Ataxic gait: Secondary | ICD-10-CM | POA: Diagnosis not present

## 2021-02-23 DIAGNOSIS — Z1159 Encounter for screening for other viral diseases: Secondary | ICD-10-CM | POA: Diagnosis not present

## 2021-02-23 NOTE — Progress Notes (Signed)
HEMATOLOGY/ONCOLOGY CLINIC NOTE  Date of Service: .02/17/2021   Patient Care Team: Seward Carol, MD as PCP - General (Internal Medicine) Belva Crome, MD as PCP - Cardiology (Cardiology)  CHIEF COMPLAINTS/PURPOSE OF CONSULTATION:  Continued management of normocytic anemia.  HISTORY OF PRESENTING ILLNESS:  Please see previous notes for details on initial presentation  INTERVAL HISTORY:  [name] is here for follow-up of her normocytic anemia. Patient was last seen in clinic by [place] on 08/05/2020. He has recently had issues with depression and some suicidal ideation and has received behavioral health input and notes that she is feeling much better now.  Notes no overt GI bleeding.  He has been taking her oral iron and B complex.  Labs today were reviewed in detail with the patient. Patient notes no significant fatigue, lightheadedness, dizziness, chest pain or shortness of breath. Notes no overt bleeding.   MEDICAL HISTORY:  Past Medical History:  Diagnosis Date   Anemia    CHRONIC   Aortic insufficiency    a. 2D echo 08/29/15: EF 55-60%, mild LVH, diastolic dysfunction, elevated LV filling pressure, mild AI, severe LAE, mild RAE, mild TR, dilated descending thoracic aorta just distal to the takeoff of the left subclavian, measuring 4.1 cm, no pericardial effusion.   Arthritis    OA-SOME BACK AND NECK PAIN,  GOES TO CHIROPRACTOR TWICE A MONTH;  HX OF JOINT REPLACEMENTS    Cancer ([facility])    SKIN CANCERS REMOVED FROM LEGS   CKD (chronic kidney disease), stage II    Depression    Essential hypertension    Gait abnormality 09/08/2017   GERD (gastroesophageal reflux disease)    Low sodium levels    Myocarditis (HCC)    PSVT (paroxysmal supraventricular tachycardia) (HCC)    Recurrent Pericarditis    RECURRENT    Thoracic aneurysm    a. Followed by [facility] -last seen in 12/2013 w/ plan for f/u MRA and thoracic surgery f/u in 2 yrs, but patient does not wish to  follow any longer.    SURGICAL HISTORY: Past Surgical History:  Procedure Laterality Date   APPENDECTOMY     BREAST EXCISIONAL BIOPSY Left 1992   benign   EXCISION/RELEASE BURSA HIP Left 11/24/2012   Procedure: LEFT HIP BURSECTOMY AND TENDON REPAIR ;  Surgeon: Gearlean Alf, MD;  Location: [facility];  Service: Orthopedics;  Laterality: Left;   HIP CLOSED REDUCTION Left 12/19/2012   Procedure: CLOSED MANIPULATION HIP;  Surgeon: Mauri Pole, MD;  Location: [facility];  Service: Orthopedics;  Laterality: Left;   HIP CLOSED REDUCTION Left 12/05/2013   Procedure: CLOSED MANIPULATION HIP;  Surgeon: Augustin Schooling, MD;  Location: [facility];  Service: Orthopedics;  Laterality: Left;   HIP CLOSED REDUCTION Left 09/22/2015   Procedure: CLOSED REDUCTION HIP;  Surgeon: Melina Schools, MD;  Location: [facility];  Service: Orthopedics;  Laterality: Left;   JOINT REPLACEMENT     LEFT TOTAL HIP REPLACEMENT AND REVISIONS X 2   OOPHORECTOMY     has a partial of one ovary remaingin   PELVIC LAPAROSCOPY     ovarian cyst removal,    REVISION TOTAL HIP ARTHROPLASTY     left   right Achilles Tendon repair  5/13   TOTAL KNEE ARTHROPLASTY     right   TOTAL KNEE ARTHROPLASTY     left   VAGINAL HYSTERECTOMY     with ovarian cyst removal    SOCIAL HISTORY: Social History   Socioeconomic  History   Marital status: Widowed    Spouse name: Not on file   Number of children: Not on file   Years of education: Not on file   Highest education level: Not on file  Occupational History   Not on file  Tobacco Use   Smoking status: Former    Years: 5.00    Types: Cigarettes    Quit date: 15    Years since quitting: 57.9   Smokeless tobacco: Never   Tobacco comments:    quit age 55  Vaping Use   Vaping Use: Never used  Substance and Sexual Activity   Alcohol use: No    Alcohol/week: 0.0 standard drinks   Drug use: No   Sexual activity: Not Currently    Birth control/protection: Surgical    Comment: HYST-1st  intercourse 87 yo-Fewer than 5 partners  Other Topics Concern   Not on file  Social History Narrative   Diet: very good      Caffeine: 1 1/2 cup coffee (usually decaf)      Married, if yes what year: Widow, 1954      Do you live in a house, apartment, assisted living, condo, trailer, ect: [place], independent living      Is it one or more stories: 2nd level. elevate      How many persons live in your home? 1      Pets: No      Highest level or education completed: 14      Current/Past profession: Actuary      Exercise:  Yes                Type and how often: aerobics twice daily         Living Will: Yes   DNR: Yes   POA/HPOA: Yes      Functional Status:   Do you have difficulty bathing or dressing yourself? No   Do you have difficulty preparing food or eating? No   Do you have difficulty managing your medications? No   Do you have difficulty managing your finances? No   Do you have difficulty affording your medications? No   Social Determinants of Radio broadcast assistant Strain: Not on file  Food Insecurity: Not on file  Transportation Needs: Not on file  Physical Activity: Not on file  Stress: Not on file  Social Connections: Not on file  Intimate Partner Violence: Not on file    FAMILY HISTORY: Family History  Problem Relation Age of Onset   Hypertension Mother    Stroke Mother    Heart attack Father 49   Heart failure Father    Heart failure Sister    Aortic aneurysm Sister    Aortic aneurysm Sister    Stroke Sister    Pneumonia Brother    Stroke Brother    ALS Other    Hyperlipidemia Daughter    Aortic aneurysm Son    Breast cancer Neg Hx     ALLERGIES:  is allergic to cephalexin, shellfish allergy, shellfish-derived products, clindamycin/lincomycin, lincomycin, other, penicillamine, sugar-protein-starch, tape, ciprofloxacin, sulfa antibiotics, and sulfites.  MEDICATIONS:  Current Outpatient Medications  Medication  Sig Dispense Refill   amoxicillin (AMOXIL) 500 MG capsule Take 2,000 mg by mouth See admin instructions. 1 hour prior to dental appointment     aspirin 81 MG tablet Take 81 mg by mouth every evening.     betamethasone dipropionate 0.05 % cream Apply topically 2 (two) times daily.  Applied to the right ear canal twice daily for 4 to 5 days or until itching resolves. (Patient not taking: No sig reported) 30 g 0   budesonide (ENTOCORT EC) 3 MG 24 hr capsule Take 3 mg by mouth daily.     Calcium Carbonate-Vit D-Min (CALCIUM 1200 PO) Take 1 tablet by mouth every evening.     carboxymethylcellulose (REFRESH PLUS) 0.5 % SOLN Place 1 drop into both eyes in the morning and at bedtime.     carvedilol (COREG) 12.5 MG tablet Take 1 tablet (12.5 mg total) by mouth 2 (two) times daily with a meal. 1/2 in the Am; 1 in the PM (Patient taking differently: Take by mouth. 6.25 mg in the morning. 12.5 mg in the evening) 180 tablet 1   cholecalciferol (VITAMIN D) 1000 UNITS tablet Take 1,000 Units by mouth daily.     clobetasol cream (TEMOVATE) 9.67 % Apply 1 application topically 2 (two) times daily as needed (itching).      diltiazem (CARDIZEM CD) 240 MG 24 hr capsule Take 1 capsule (240 mg total) by mouth daily. 90 capsule 1   estradiol (ESTRACE) 0.5 MG tablet Take 1 tablet (0.5 mg total) by mouth daily. 90 tablet 0   ferrous sulfate 325 (65 FE) MG tablet Take 325 mg by mouth daily.     meloxicam (MOBIC) 7.5 MG tablet Take 7.5 mg by mouth daily as needed for pain.      mupirocin ointment (BACTROBAN) 2 % Apply 1 application topically See admin instructions. Apply to leg after showers     omega-3 acid ethyl esters (LOVAZA) 1 g capsule Take 1 g by mouth daily.      oxyCODONE-acetaminophen (PERCOCET/ROXICET) 5-325 MG tablet Take 1 tablet by mouth every 6 (six) hours as needed for severe pain. 10 tablet 0   sodium chloride 1 g tablet Take 1 g by mouth daily.     tretinoin (RETIN-A) 0.1 % cream Apply 1 application  topically at bedtime as needed (breakouts).     No current facility-administered medications for this visit.    REVIEW OF SYSTEMS:   .10 Point review of Systems was done is negative except as noted above.  PHYSICAL EXAMINATION: ECOG PERFORMANCE STATUS: 0 - Asymptomatic  .BP 137/61 (BP Location: Left Arm, Patient Position: Sitting)   Pulse 66   Temp 98.1 F (36.7 C) (Temporal)   Resp 17   Ht 5' 3.75" (1.619 m)   Wt 150 lb 9.6 oz (68.3 kg)   SpO2 98%   BMI 26.05 kg/m   Exam was given in a chair.   Marland Kitchen GENERAL:alert, in no acute distress and comfortable SKIN: no acute rashes, no significant lesions EYES: conjunctiva are pink and non-injected, sclera anicteric OROPHARYNX: MMM, no exudates, no oropharyngeal erythema or ulceration NECK: supple, no JVD LYMPH:  no palpable lymphadenopathy in the cervical, axillary or inguinal regions LUNGS: clear to auscultation b/l with normal respiratory effort HEART: regular rate & rhythm ABDOMEN:  normoactive bowel sounds , non tender, not distended. Extremity: no pedal edema PSYCH: alert & oriented x 3 with fluent speech NEURO: no focal motor/sensory deficits   LABORATORY DATA:   I have reviewed the data as listed  . CBC Latest Ref Rng & Units 02/17/2021 02/17/2021 01/26/2021  WBC 4.0 - 10.5 K/uL 8.6 - 8.7  Hemoglobin 12.0 - 15.0 g/dL 8.7(L) - 7.5(L)  Hematocrit 34.0 - 46.6 % 27.4(L) 28.2(L) 24.2(L)  Platelets 150 - 400 K/uL 278 - 253   . CBC  Component Value Date/Time   WBC 8.6 02/17/2021 1001   WBC 8.7 01/26/2021 2031   RBC 2.95 (L) 02/17/2021 1001   HGB 8.7 (L) 02/17/2021 1001   HGB 10.8 (L) 12/26/2007 1539   HCT 27.4 (L) 02/17/2021 1001   HCT 28.2 (L) 02/17/2021 1001   HCT 33.4 (L) 12/26/2007 1539   PLT 278 02/17/2021 1001   PLT 314 12/26/2007 1539   MCV 92.9 02/17/2021 1001   MCV 88.5 12/26/2007 1539   MCH 29.5 02/17/2021 1001   MCHC 31.8 02/17/2021 1001   RDW 14.6 02/17/2021 1001   RDW 15.3 (H) 12/26/2007 1539    LYMPHSABS 2.4 02/17/2021 1001   LYMPHSABS 2.2 12/26/2007 1539   MONOABS 0.9 02/17/2021 1001   MONOABS 0.8 12/26/2007 1539   EOSABS 0.3 02/17/2021 1001   EOSABS 0.6 (H) 12/26/2007 1539   BASOSABS 0.2 (H) 02/17/2021 1001   BASOSABS 0.1 12/26/2007 1539     . CMP Latest Ref Rng & Units 02/17/2021 01/26/2021 01/18/2021  Glucose 70 - 99 mg/dL 89 112(H) 108(H)  BUN 8 - 23 mg/dL 37(H) 28(H) 20  Creatinine 0.44 - 1.00 mg/dL 1.14(H) 1.32(H) 1.08(H)  Sodium 135 - 145 mmol/L 135 131(L) 131(L)  Potassium 3.5 - 5.1 mmol/L 4.8 4.5 4.0  Chloride 98 - 111 mmol/L 103 100 100  CO2 22 - 32 mmol/L 22 23 24   Calcium 8.9 - 10.3 mg/dL 8.7(L) 8.6(L) 8.4(L)  Total Protein 6.5 - 8.1 g/dL 7.5 - -  Total Bilirubin 0.3 - 1.2 mg/dL 0.4 - -  Alkaline Phos 38 - 126 U/L 55 - -  AST 15 - 41 U/L 24 - -  ALT 0 - 44 U/L 16 - -   Component     Latest Ref Rng & Units 03/06/2020  Iron     41 - 142 ug/dL 35 (L)  TIBC     236 - 444 ug/dL 448 (H)  Saturation Ratios     21 - 57 % 8 (L)  UIBC     120 - 384 ug/dL 413 (H)  Folate, Hemolysate     Not Estab. ng/mL 605.0  HCT     34.0 - 46.6 % 29.6 (L)  Folate, RBC     >498 ng/mL 2,044  Erythropoietin     2.6 - 18.5 mIU/mL 14.9  RA Latex Turbid.     <14.0 IU/mL 99.5 (H)  Vitamin B12     180 - 914 pg/mL 841  Ferritin     11 - 307 ng/mL 35  Sed Rate     0 - 22 mm/hr 22   . Lab Results  Component Value Date   IRON 60 02/17/2021   TIBC 308 02/17/2021   IRONPCTSAT 19 (L) 02/17/2021   (Iron and TIBC)  Lab Results  Component Value Date   FERRITIN 462 (H) 02/17/2021     RADIOGRAPHIC STUDIES: I have personally reviewed the radiological images as listed and agreed with the findings in the report. US Venous Img Lower Unilateral Left (DVT)  Result Date: 01/27/2021 CLINICAL DATA:  Left lower extremity edema for the past 2 weeks. Evaluate for DVT. EXAM: LEFT LOWER EXTREMITY VENOUS DOPPLER ULTRASOUND TECHNIQUE: Gray-scale sonography with graded compression, as  well as color Doppler and duplex ultrasound were performed to evaluate the lower extremity deep venous systems from the level of the common femoral vein and including the common femoral, femoral, profunda femoral, popliteal and calf veins including the posterior tibial, peroneal and gastrocnemius veins when visible. The superficial  great saphenous vein was also interrogated. Spectral Doppler was utilized to evaluate flow at rest and with distal augmentation maneuvers in the common femoral, femoral and popliteal veins. COMPARISON:  None. FINDINGS: Contralateral Common Femoral Vein: Respiratory phasicity is normal and symmetric with the symptomatic side. No evidence of thrombus. Normal compressibility. Common Femoral Vein: No evidence of thrombus. Normal compressibility, respiratory phasicity and response to augmentation. Saphenofemoral Junction: No evidence of thrombus. Normal compressibility and flow on color Doppler imaging. Profunda Femoral Vein: No evidence of thrombus. Normal compressibility and flow on color Doppler imaging. Femoral Vein: No evidence of thrombus. Normal compressibility, respiratory phasicity and response to augmentation. Popliteal Vein: No evidence of thrombus. Normal compressibility, respiratory phasicity and response to augmentation. Calf Veins: No evidence of thrombus. Normal compressibility and flow on color Doppler imaging. Superficial Great Saphenous Vein: No evidence of thrombus. Normal compressibility. Venous Reflux:  None. Other Findings:  None. IMPRESSION: No evidence of DVT within the left lower extremity. Electronically Signed   By: Sandi Mariscal M.D.   On: 01/27/2021 09:13    ASSESSMENT & PLAN:   87 yo with   1) Chronic normocytic anemia Due to CKD + iron deficiency + anemia of chronic inflammation from possible RA 2) RA -not currently on treatment PLAN: -Discussed patient's labs done today CBC shows hemoglobin of 8.7 with a WBC count of 8.6k and platelets of 278k -CMP  unremarkable -Ferritin 462 with an iron saturation of 19% -B12 -- 520 -LDH 290 with normal haptoglobin levels suggesting against overt intravascular hemolysis. -Myeloma panel shows no M spike -RBC folate-within normal limits -Continue f/u w PCP regarding management of RA. -Patient's ferritin level is overestimated in the setting of inflammation from rheumatoid arthritis.  Iron saturation is less than 20%. -Would recommend IV Injectafer 750 mg x 1 dose. -Optimization of rheumatoid arthritis treatment with primary care physician/rheumatology.  The patient still has anemia from chronic inflammation related to the rheumatoid arthritis might need to consider equal for hemoglobin less than 9 or if oxygen dependent. -Continue vitamin B complex 1 capsule p.o. daily  FOLLOW UP: Please schedule for 1 dose of IV Injectafer Return to clinic with Dr. Irene Limbo with labs in 3 months   . The total time spent in the appointment was 25 minutes and more than 50% was on counseling and direct patient cares.  All of the patient's questions were answered with apparent satisfaction. The patient knows to call the clinic with any problems, questions or concerns.    Sullivan Lone MD [place] AAHIVMS [facility] Hematology/Oncology Physician [facility]

## 2021-02-24 ENCOUNTER — Ambulatory Visit (INDEPENDENT_AMBULATORY_CARE_PROVIDER_SITE_OTHER): Payer: Medicare Other | Admitting: Family Medicine

## 2021-02-24 ENCOUNTER — Telehealth: Payer: Self-pay | Admitting: Hematology

## 2021-02-24 ENCOUNTER — Other Ambulatory Visit: Payer: Self-pay

## 2021-02-24 ENCOUNTER — Encounter: Payer: Self-pay | Admitting: Family Medicine

## 2021-02-24 VITALS — BP 138/72 | HR 65 | Temp 97.8°F | Ht 63.75 in | Wt 151.4 lb

## 2021-02-24 DIAGNOSIS — I10 Essential (primary) hypertension: Secondary | ICD-10-CM

## 2021-02-24 DIAGNOSIS — R269 Unspecified abnormalities of gait and mobility: Secondary | ICD-10-CM

## 2021-02-24 DIAGNOSIS — I351 Nonrheumatic aortic (valve) insufficiency: Secondary | ICD-10-CM

## 2021-02-24 DIAGNOSIS — D649 Anemia, unspecified: Secondary | ICD-10-CM | POA: Diagnosis not present

## 2021-02-24 NOTE — Telephone Encounter (Signed)
Scheduled per sch msg. Called and left msg  

## 2021-02-24 NOTE — Progress Notes (Signed)
Provider:  Alain Honey, MD  Careteam: Patient Care Team: Seward Carol, MD as PCP - General (Internal Medicine) Belva Crome, MD as PCP - Cardiology (Cardiology)  PLACE OF SERVICE:  [place] Directive information Does Patient Have a Medical Advance Directive?: Yes, Type of Advance Directive: [place];Living will;Out of facility DNR (pink MOST or yellow form), Does patient want to make changes to medical advance directive?: No - Patient declined  Allergies  Allergen Reactions   Cephalexin Other (See Comments)    Pt states this causes eye problems and STROKE-LIKE SYMPTOMS!! Other reaction(s): stroke symptoms   Shellfish Allergy Hives and Rash   Shellfish-Derived Products Hives and Rash    Other reaction(s): hives   Clindamycin/Lincomycin Rash   Lincomycin Rash   Other     Other reaction(s): rash   Penicillamine    Sugar-Protein-Starch Other (See Comments)    Sugary foods cause legs to cramp   Tape Other (See Comments)    Please use paper tape or Coban Wrap; patient's skin tears VERY easily!!   Ciprofloxacin Rash    Other reaction(s): rash   Sulfa Antibiotics Hives and Rash   Sulfites Hives and Rash    Other reaction(s): swelling    Chief Complaint  Patient presents with   New Patient (Initial Visit)    Patient presents today for a new patient appointment.     HPI: Patient is a 87 y.o. female .  First visit for this 87 year old female who is a resident at [facility].  She is a widower having lost her husband about 2 years ago.  She seems healthy for her age.  She is on very few prescription medicines and these include carvedilol and diltiazem.  She also takes budesonide for some inflammatory bowel disease but has expressed a desire to discontinue that.  She has a history of anemia probably related to chronic disease and rheumatoid arthritis.  Currently takes iron supplement, but at maintenance dose levels. There is no history of  cognitive problems.  She is accompanied by her daughter-in-law today who agrees.  Appetite is good.  Sleep is good.  She uses a walker for ambulation but has had a fall recently without any fractures.  She is status post 3 knee replacements and one hip replacement  Review of Systems:  Review of Systems  Constitutional:  Positive for malaise/fatigue.  Eyes: Negative.   Respiratory: Negative.    Cardiovascular: Negative.   Gastrointestinal:  Positive for diarrhea.  Genitourinary: Negative.   Musculoskeletal:  Positive for joint pain.  Neurological: Negative.   Psychiatric/Behavioral: Negative.    All other systems reviewed and are negative.  Past Medical History:  Diagnosis Date   Anemia    CHRONIC   Aortic insufficiency    a. 2D echo 08/29/15: EF 55-60%, mild LVH, diastolic dysfunction, elevated LV filling pressure, mild AI, severe LAE, mild RAE, mild TR, dilated descending thoracic aorta just distal to the takeoff of the left subclavian, measuring 4.1 cm, no pericardial effusion.   Arthritis    OA-SOME BACK AND NECK PAIN,  GOES TO CHIROPRACTOR TWICE A MONTH;  HX OF JOINT REPLACEMENTS    Cancer ([place])    SKIN CANCERS REMOVED FROM LEGS   CKD (chronic kidney disease), stage II    Depression    Essential hypertension    Gait abnormality 09/08/2017   GERD (gastroesophageal reflux disease)    Low sodium levels    Myocarditis (HCC)    PSVT (  paroxysmal supraventricular tachycardia) (HCC)    Recurrent Pericarditis    RECURRENT    Thoracic aneurysm    a. Followed by [facility] -last seen in 12/2013 w/ plan for f/u MRA and thoracic surgery f/u in 2 yrs, but patient does not wish to follow any longer.   Past Surgical History:  Procedure Laterality Date   APPENDECTOMY     BREAST EXCISIONAL BIOPSY Left 1992   benign   EXCISION/RELEASE BURSA HIP Left 11/24/2012   Procedure: LEFT HIP BURSECTOMY AND TENDON REPAIR ;  Surgeon: Gearlean Alf, MD;  Location: [facility];  Service: Orthopedics;   Laterality: Left;   HIP CLOSED REDUCTION Left 12/19/2012   Procedure: CLOSED MANIPULATION HIP;  Surgeon: Mauri Pole, MD;  Location: [facility];  Service: Orthopedics;  Laterality: Left;   HIP CLOSED REDUCTION Left 12/05/2013   Procedure: CLOSED MANIPULATION HIP;  Surgeon: Augustin Schooling, MD;  Location: [facility];  Service: Orthopedics;  Laterality: Left;   HIP CLOSED REDUCTION Left 09/22/2015   Procedure: CLOSED REDUCTION HIP;  Surgeon: Melina Schools, MD;  Location: [facility];  Service: Orthopedics;  Laterality: Left;   JOINT REPLACEMENT     LEFT TOTAL HIP REPLACEMENT AND REVISIONS X 2   OOPHORECTOMY     has a partial of one ovary remaingin   PELVIC LAPAROSCOPY     ovarian cyst removal,    REVISION TOTAL HIP ARTHROPLASTY     left   right Achilles Tendon repair  5/13   TOTAL KNEE ARTHROPLASTY     right   TOTAL KNEE ARTHROPLASTY     left   VAGINAL HYSTERECTOMY     with ovarian cyst removal   Social History:   reports that she quit smoking about 57 years ago. Her smoking use included cigarettes. She has never used smokeless tobacco. She reports that she does not currently use drugs. She reports that she does not drink alcohol.  Family History  Problem Relation Age of Onset   Hypertension Mother    Stroke Mother    Heart attack Father 36   Heart failure Father    Heart failure Sister    Aortic aneurysm Sister    Aortic aneurysm Sister    Stroke Sister    Pneumonia Brother    Stroke Brother    ALS Other    Hyperlipidemia Daughter    Aortic aneurysm Son    Breast cancer Neg Hx     Medications: Patient's Medications  New Prescriptions   No medications on file  Previous Medications   AMOXICILLIN (AMOXIL) 500 MG CAPSULE    Take 2,000 mg by mouth See admin instructions. 1 hour prior to dental appointment   ASPIRIN 81 MG TABLET    Take 81 mg by mouth every evening.   BETAMETHASONE DIPROPIONATE 0.05 % CREAM    Apply topically 2 (two) times daily. Applied to the right ear canal twice  daily for 4 to 5 days or until itching resolves.   BUDESONIDE (ENTOCORT EC) 3 MG 24 HR CAPSULE    Take 3 mg by mouth daily.   CALCIUM CARBONATE-VIT D-MIN (CALCIUM 1200 PO)    Take 1 tablet by mouth every evening.   CARBOXYMETHYLCELLULOSE (REFRESH PLUS) 0.5 % SOLN    Place 1 drop into both eyes in the morning and at bedtime.   CARVEDILOL (COREG) 12.5 MG TABLET    Take 1 tablet (12.5 mg total) by mouth 2 (two) times daily with a meal. 1/2 in the Am; 1 in  the PM   CHOLECALCIFEROL (VITAMIN D) 1000 UNITS TABLET    Take 1,000 Units by mouth daily.   CLOBETASOL CREAM (TEMOVATE) 0.05 %    Apply 1 application topically 2 (two) times daily as needed (itching).    DILTIAZEM (CARDIZEM CD) 240 MG 24 HR CAPSULE    Take 1 capsule (240 mg total) by mouth daily.   ESTRADIOL (ESTRACE) 0.5 MG TABLET    Take 1 tablet (0.5 mg total) by mouth daily.   FERROUS SULFATE 325 (65 FE) MG TABLET    Take 325 mg by mouth daily.   MELOXICAM (MOBIC) 7.5 MG TABLET    Take 7.5 mg by mouth daily as needed for pain.    MUPIROCIN OINTMENT (BACTROBAN) 2 %    Apply 1 application topically See admin instructions. Apply to leg after showers   OMEGA-3 ACID ETHYL ESTERS (LOVAZA) 1 G CAPSULE    Take 1 g by mouth daily.    OXYCODONE-ACETAMINOPHEN (PERCOCET/ROXICET) 5-325 MG TABLET    Take 1 tablet by mouth every 6 (six) hours as needed for severe pain.   SODIUM CHLORIDE 1 G TABLET    Take 1 g by mouth daily.   TRETINOIN (RETIN-A) 0.1 % CREAM    Apply 1 application topically at bedtime as needed (breakouts).  Modified Medications   No medications on file  Discontinued Medications   No medications on file    Physical Exam:  Vitals:   02/24/21 0833  BP: 138/72  Pulse: 65  Temp: 97.8 F (36.6 C)  SpO2: 97%  Weight: 151 lb 6.4 oz (68.7 kg)  Height: 5' 3.75" (1.619 m)   Body mass index is 26.19 kg/m. Wt Readings from Last 3 Encounters:  02/24/21 151 lb 6.4 oz (68.7 kg)  02/17/21 150 lb 9.6 oz (68.3 kg)  01/19/21 146 lb (66.2 kg)     Physical Exam Vitals and nursing note reviewed.  Constitutional:      Appearance: Normal appearance.  HENT:     Head: Normocephalic.     Right Ear: Tympanic membrane normal.     Left Ear: Tympanic membrane normal.  Eyes:     Pupils: Pupils are equal, round, and reactive to light.  Cardiovascular:     Rate and Rhythm: Normal rate and regular rhythm.     Heart sounds: Murmur heard.  Pulmonary:     Effort: Pulmonary effort is normal.  Abdominal:     General: Abdomen is flat. Bowel sounds are normal.     Palpations: Abdomen is soft.  Musculoskeletal:        General: Normal range of motion.  Neurological:     General: No focal deficit present.     Mental Status: She is alert and oriented to person, place, and time.  Psychiatric:        Mood and Affect: Mood normal.        Behavior: Behavior normal.        Thought Content: Thought content normal.    Labs reviewed: Basic Metabolic Panel: Recent Labs    01/16/21 1748 01/18/21 0928 01/26/21 2031 02/17/21 1001  NA 126* 131* 131* 135  K 4.6 4.0 4.5 4.8  CL 96* 100 100 103  CO2 23 24 23 22   GLUCOSE 115* 108* 112* 89  BUN 28* 20 28* 37*  CREATININE 1.07* 1.08* 1.32* 1.14*  CALCIUM 8.2* 8.4* 8.6* 8.7*  TSH 2.213  --   --   --    Liver Function Tests: Recent Labs  08/05/20 0943 01/16/21 1748 02/17/21 1001  AST 23 28 24   ALT 17 26 16   ALKPHOS 60 49 55  BILITOT 0.3 0.8 0.4  PROT 7.5 6.9 7.5  ALBUMIN 3.4* 3.4* 3.8   No results for input(s): LIPASE, AMYLASE in the last 8760 hours. No results for input(s): AMMONIA in the last 8760 hours. CBC: Recent Labs    01/16/21 1748 01/26/21 2031 02/17/21 1001  WBC 13.8* 8.7 8.6  NEUTROABS 10.2* 5.3 4.8  HGB 8.7* 7.5* 8.7*  HCT 26.5* 24.2* 27.4*   28.2*  MCV 92.0 94.5 92.9  PLT 311 253 278   Lipid Panel: No results for input(s): CHOL, HDL, LDLCALC, TRIG, CHOLHDL, LDLDIRECT in the last 8760 hours. TSH: Recent Labs    01/16/21 1748  TSH 2.213   A1C: No  results found for: HGBA1C   Assessment/Plan  1. Anemia, unspecified type Probably related to chronic disease.  Most recent hemoglobin was 8.7.  Patient expresses a desire to keep a closer check on her hemoglobin.  She does feel tired when hemoglobin drops below 10 and we can use that as a guide but I have also scheduled her to repeat CBC in 3 months  2. Nonrheumatic aortic valve insufficiency Murmur is present.  She is followed by cardiology she takes antibiotic prior to any dental procedures  3. Gait abnormality Uses a walker.  Tries to stay active doing some xercise classes at her assisted living facility    4. Essential hypertension Blood pressure is good today at 138/72 continue with carvedilol as before  Alain Honey, MD [facility] [phone]

## 2021-02-24 NOTE — Patient Instructions (Signed)
Until your next visit in 3 months, increase your iron supplement to at least 2 tablets/day with food Stay as active as you can (as we discussed) You may try to taper the budesonide by skipping days between doses, as we discussed

## 2021-02-25 ENCOUNTER — Ambulatory Visit: Payer: Medicare Other | Admitting: Obstetrics and Gynecology

## 2021-02-25 DIAGNOSIS — R260 Ataxic gait: Secondary | ICD-10-CM | POA: Diagnosis not present

## 2021-02-25 DIAGNOSIS — M6281 Muscle weakness (generalized): Secondary | ICD-10-CM | POA: Diagnosis not present

## 2021-02-25 DIAGNOSIS — Z1159 Encounter for screening for other viral diseases: Secondary | ICD-10-CM | POA: Diagnosis not present

## 2021-02-25 DIAGNOSIS — Z20828 Contact with and (suspected) exposure to other viral communicable diseases: Secondary | ICD-10-CM | POA: Diagnosis not present

## 2021-02-25 DIAGNOSIS — R296 Repeated falls: Secondary | ICD-10-CM | POA: Diagnosis not present

## 2021-02-25 DIAGNOSIS — Z9181 History of falling: Secondary | ICD-10-CM | POA: Diagnosis not present

## 2021-02-27 DIAGNOSIS — Z20828 Contact with and (suspected) exposure to other viral communicable diseases: Secondary | ICD-10-CM | POA: Diagnosis not present

## 2021-02-27 DIAGNOSIS — Z1159 Encounter for screening for other viral diseases: Secondary | ICD-10-CM | POA: Diagnosis not present

## 2021-03-02 DIAGNOSIS — M9903 Segmental and somatic dysfunction of lumbar region: Secondary | ICD-10-CM | POA: Diagnosis not present

## 2021-03-02 DIAGNOSIS — M6281 Muscle weakness (generalized): Secondary | ICD-10-CM | POA: Diagnosis not present

## 2021-03-02 DIAGNOSIS — Z9181 History of falling: Secondary | ICD-10-CM | POA: Diagnosis not present

## 2021-03-02 DIAGNOSIS — R260 Ataxic gait: Secondary | ICD-10-CM | POA: Diagnosis not present

## 2021-03-02 DIAGNOSIS — M4726 Other spondylosis with radiculopathy, lumbar region: Secondary | ICD-10-CM | POA: Diagnosis not present

## 2021-03-02 DIAGNOSIS — M9901 Segmental and somatic dysfunction of cervical region: Secondary | ICD-10-CM | POA: Diagnosis not present

## 2021-03-02 DIAGNOSIS — R296 Repeated falls: Secondary | ICD-10-CM | POA: Diagnosis not present

## 2021-03-02 DIAGNOSIS — Z1159 Encounter for screening for other viral diseases: Secondary | ICD-10-CM | POA: Diagnosis not present

## 2021-03-02 DIAGNOSIS — Z20828 Contact with and (suspected) exposure to other viral communicable diseases: Secondary | ICD-10-CM | POA: Diagnosis not present

## 2021-03-02 DIAGNOSIS — M9902 Segmental and somatic dysfunction of thoracic region: Secondary | ICD-10-CM | POA: Diagnosis not present

## 2021-03-04 DIAGNOSIS — Z20828 Contact with and (suspected) exposure to other viral communicable diseases: Secondary | ICD-10-CM | POA: Diagnosis not present

## 2021-03-04 DIAGNOSIS — R260 Ataxic gait: Secondary | ICD-10-CM | POA: Diagnosis not present

## 2021-03-04 DIAGNOSIS — Z9181 History of falling: Secondary | ICD-10-CM | POA: Diagnosis not present

## 2021-03-04 DIAGNOSIS — R296 Repeated falls: Secondary | ICD-10-CM | POA: Diagnosis not present

## 2021-03-04 DIAGNOSIS — Z1159 Encounter for screening for other viral diseases: Secondary | ICD-10-CM | POA: Diagnosis not present

## 2021-03-04 DIAGNOSIS — M6281 Muscle weakness (generalized): Secondary | ICD-10-CM | POA: Diagnosis not present

## 2021-03-06 MED FILL — Ferric Carboxymaltose IV Soln 750 MG/15ML (Fe Equivalent): INTRAVENOUS | Qty: 15 | Status: AC

## 2021-03-07 ENCOUNTER — Inpatient Hospital Stay: Payer: Medicare Other

## 2021-03-07 ENCOUNTER — Telehealth: Payer: Self-pay

## 2021-03-07 NOTE — Telephone Encounter (Signed)
This RN called patient regarding her 0900 appointment scheduled for 12/24. Patient stated she was not notified of her appointment. Message was sent to scheduling to get her rescheduled.

## 2021-03-10 NOTE — Progress Notes (Signed)
87 y.o. G2P2 Widowed Caucasian female here for breast & pelvic exam.    Asking if she can stop coming for GYN visits. Started estrogen at age 70 years old following her hysterectomy which was done for pelvic pain and ovarian cysts.  Has a portion of one ovary remaining.  Feels good on her current estrogen, and she declines stopping this.   Had a flare of RA in the last year.   From [place].   PCP:   Shea Evans, MD Rheumatology:  will have upcoming appointment.   No LMP recorded. Patient has had a hysterectomy.           Sexually active: No.  The current method of family planning is status post hysterectomy.    Exercising: Yes.     Exercise class Smoker:  no  Health Maintenance: Pap:  2012 History of abnormal Pap:  no MMG:  12-08-20 category c density birads 1:neg.  She declines future mammogram and would not treat breast cancer.  Colonoscopy: 05-26-20 BMD:   08-02-14  Result  normal.  Will do in February, 2023.  PCP following.  TDaP:  2013 Gardasil:   n/a HIV: not done Hep C: not done Screening Labs:  PCP   reports that she quit smoking about 58 years ago. Her smoking use included cigarettes. She has never used smokeless tobacco. She reports that she does not currently use drugs. She reports that she does not drink alcohol.  Past Medical History:  Diagnosis Date   Anemia    CHRONIC   Aortic insufficiency    a. 2D echo 08/29/15: EF 55-60%, mild LVH, diastolic dysfunction, elevated LV filling pressure, mild AI, severe LAE, mild RAE, mild TR, dilated descending thoracic aorta just distal to the takeoff of the left subclavian, measuring 4.1 cm, no pericardial effusion.   Arthritis    OA-SOME BACK AND NECK PAIN,  GOES TO CHIROPRACTOR TWICE A MONTH;  HX OF JOINT REPLACEMENTS    Cancer ([place])    SKIN CANCERS REMOVED FROM LEGS   CKD (chronic kidney disease), stage II    Depression    Essential hypertension    Gait abnormality 09/08/2017   GERD (gastroesophageal reflux disease)     Low sodium levels    Myocarditis (HCC)    PSVT (paroxysmal supraventricular tachycardia) (HCC)    Recurrent Pericarditis    RECURRENT    Thoracic aneurysm    a. Followed by [facility] -last seen in 12/2013 w/ plan for f/u MRA and thoracic surgery f/u in 2 yrs, but patient does not wish to follow any longer.    Past Surgical History:  Procedure Laterality Date   APPENDECTOMY     BREAST EXCISIONAL BIOPSY Left 1992   benign   EXCISION/RELEASE BURSA HIP Left 11/24/2012   Procedure: LEFT HIP BURSECTOMY AND TENDON REPAIR ;  Surgeon: Gearlean Alf, MD;  Location: [facility];  Service: Orthopedics;  Laterality: Left;   HIP CLOSED REDUCTION Left 12/19/2012   Procedure: CLOSED MANIPULATION HIP;  Surgeon: Mauri Pole, MD;  Location: [facility];  Service: Orthopedics;  Laterality: Left;   HIP CLOSED REDUCTION Left 12/05/2013   Procedure: CLOSED MANIPULATION HIP;  Surgeon: Augustin Schooling, MD;  Location: [facility];  Service: Orthopedics;  Laterality: Left;   HIP CLOSED REDUCTION Left 09/22/2015   Procedure: CLOSED REDUCTION HIP;  Surgeon: Melina Schools, MD;  Location: [facility];  Service: Orthopedics;  Laterality: Left;   JOINT REPLACEMENT     LEFT TOTAL HIP REPLACEMENT AND REVISIONS  X 2   OOPHORECTOMY     has a partial of one ovary remaingin   PELVIC LAPAROSCOPY     ovarian cyst removal,    REVISION TOTAL HIP ARTHROPLASTY     left   right Achilles Tendon repair  5/13   TOTAL KNEE ARTHROPLASTY     right   TOTAL KNEE ARTHROPLASTY     left   VAGINAL HYSTERECTOMY     with ovarian cyst removal    Current Outpatient Medications  Medication Sig Dispense Refill   aspirin 81 MG tablet Take 81 mg by mouth every evening.     budesonide (ENTOCORT EC) 3 MG 24 hr capsule Take 3 mg by mouth daily.     Calcium Carbonate-Vit D-Min (CALCIUM 1200 PO) Take 1 tablet by mouth every evening.     carboxymethylcellulose (REFRESH PLUS) 0.5 % SOLN Place 1 drop into both eyes in the morning and at bedtime.     carvedilol (COREG)  12.5 MG tablet Take 1 tablet (12.5 mg total) by mouth 2 (two) times daily with a meal. 1/2 in the Am; 1 in the PM (Patient taking differently: Take by mouth. 6.25 mg in the morning. 12.5 mg in the evening) 180 tablet 1   cholecalciferol (VITAMIN D) 1000 UNITS tablet Take 1,000 Units by mouth daily. D3     clobetasol cream (TEMOVATE) 4.78 % Apply 1 application topically 2 (two) times daily as needed (itching).      diltiazem (CARDIZEM CD) 240 MG 24 hr capsule Take 1 capsule (240 mg total) by mouth daily. 90 capsule 1   estradiol (ESTRACE) 0.5 MG tablet Take 1 tablet (0.5 mg total) by mouth daily. 90 tablet 0   ferrous sulfate 325 (65 FE) MG tablet Take 325 mg by mouth daily.     meloxicam (MOBIC) 7.5 MG tablet Take 7.5 mg by mouth daily as needed for pain.      mupirocin ointment (BACTROBAN) 2 % Apply 1 application topically See admin instructions. Apply to leg after showers     omega-3 acid ethyl esters (LOVAZA) 1 g capsule Take 1 g by mouth daily.      sodium chloride 1 g tablet Take 1 g by mouth daily.     tretinoin (RETIN-A) 0.1 % cream Apply 1 application topically at bedtime as needed (breakouts).     amoxicillin (AMOXIL) 500 MG capsule Take 2,000 mg by mouth See admin instructions. 1 hour prior to dental appointment (Patient not taking: Reported on 03/11/2021)     betamethasone dipropionate 0.05 % cream Apply topically 2 (two) times daily. Applied to the right ear canal twice daily for 4 to 5 days or until itching resolves. (Patient not taking: Reported on 03/11/2021) 30 g 0   No current facility-administered medications for this visit.    Family History  Problem Relation Age of Onset   Hypertension Mother    Stroke Mother    Heart attack Father 68   Heart failure Father    Heart failure Sister    Aortic aneurysm Sister    Aortic aneurysm Sister    Stroke Sister    Pneumonia Brother    Stroke Brother    ALS Other    Hyperlipidemia Daughter    Aortic aneurysm Son    Breast  cancer Neg Hx     Review of Systems  Constitutional: Negative.   HENT: Negative.    Eyes: Negative.   Respiratory: Negative.    Cardiovascular: Negative.   Gastrointestinal: Negative.  Endocrine: Negative.   Genitourinary: Negative.   Musculoskeletal: Negative.   Skin: Negative.   Allergic/Immunologic: Negative.   Neurological: Negative.   Hematological: Negative.   Psychiatric/Behavioral: Negative.     Exam:   BP 120/70    Pulse 68    Resp 14    Ht 5' 2.75" (1.594 m)    Wt 153 lb (69.4 kg)    BMI 27.32 kg/m     General appearance: alert, cooperative and appears stated age.  Using a cane with ambulation.  Head: normocephalic, without obvious abnormality, atraumatic Neck: no adenopathy, supple, symmetrical, trachea midline and thyroid normal to inspection and palpation Lungs: scattered wheezing. Breasts: normal appearance, no masses or tenderness, No nipple retraction or dimpling, No nipple discharge or bleeding, No axillary adenopathy Heart: regular rate and rhythm Abdomen: soft, non-tender; no masses, no organomegaly Extremities: LE edema bilaterally. Skin: scattered ecchymoses. Lymph nodes: cervical, supraclavicular, and axillary nodes normal. Neurologic: grossly normal  Pelvic: External genitalia:  no lesions              No abnormal inguinal nodes palpated.              Urethra:  normal appearing urethra with no masses, tenderness or lesions              Bartholins and Skenes: normal                 Vagina: normal appearing vagina with normal color and discharge, no lesions              Cervix:absent              Pap taken: no Bimanual Exam:  Uterus:  absent              Adnexa: no mass, fullness, tenderness              Rectal exam: Yes.  .  Confirms.              Anus:  normal sphincter tone, no lesions  Chaperone was present for exam:  Glorianne Manchester, RN  Assessment:   Well woman visit with gynecologic exam. Status post hysterectomy. One ovary remains.  On ERT.   Hx aneurysm, aortic insufficiency, HTN, tachycardia.   Plan: Mammogram screening discussed. Self breast awareness reviewed. Pap not indicated.  Guidelines for Calcium, Vitamin D, regular exercise program including cardiovascular and weight bearing exercise. We discussed the WHI and increased risk of stroke, DVT, and PE taking ERT.  I did indicate my preference was for her to stop her ERT, but she wishes to continue and accepts the potential risk.  Will switch from oral estrogen to transdermal estrogen.  Vivelle Dot 0.025 mg twice weekly, #24, RF 3.   Follow up annually and prn.   After visit summary provided.   30 min  total time was spent for this patient encounter, including preparation, face-to-face counseling with the patient, coordination of care, and documentation of the encounter.

## 2021-03-11 ENCOUNTER — Other Ambulatory Visit: Payer: Self-pay

## 2021-03-11 ENCOUNTER — Ambulatory Visit (INDEPENDENT_AMBULATORY_CARE_PROVIDER_SITE_OTHER): Payer: Medicare Other | Admitting: Obstetrics and Gynecology

## 2021-03-11 ENCOUNTER — Encounter: Payer: Self-pay | Admitting: Obstetrics and Gynecology

## 2021-03-11 VITALS — BP 120/70 | HR 68 | Resp 14 | Ht 62.75 in | Wt 153.0 lb

## 2021-03-11 DIAGNOSIS — Z5181 Encounter for therapeutic drug level monitoring: Secondary | ICD-10-CM

## 2021-03-11 DIAGNOSIS — Z79899 Other long term (current) drug therapy: Secondary | ICD-10-CM

## 2021-03-11 DIAGNOSIS — Z01419 Encounter for gynecological examination (general) (routine) without abnormal findings: Secondary | ICD-10-CM

## 2021-03-11 MED ORDER — ESTRADIOL 0.025 MG/24HR TD PTTW: 1.0000 | MEDICATED_PATCH | TRANSDERMAL | 3 refills | Status: DC

## 2021-03-11 NOTE — Patient Instructions (Signed)

## 2021-03-12 ENCOUNTER — Telehealth: Payer: Self-pay

## 2021-03-12 DIAGNOSIS — R260 Ataxic gait: Secondary | ICD-10-CM | POA: Diagnosis not present

## 2021-03-12 DIAGNOSIS — Z9181 History of falling: Secondary | ICD-10-CM | POA: Diagnosis not present

## 2021-03-12 DIAGNOSIS — M6281 Muscle weakness (generalized): Secondary | ICD-10-CM | POA: Diagnosis not present

## 2021-03-12 DIAGNOSIS — R296 Repeated falls: Secondary | ICD-10-CM | POA: Diagnosis not present

## 2021-03-12 NOTE — Telephone Encounter (Signed)
Note received from front desk that patient called about changing to Premarin.  I called patient and discussed her estrogen patch that was prescribed yesterday.  We discussed her trying to go off estrogen but she does not even want to try. She started the conversation saying she wanted to try Premarin 0.3 since lower dose but after our conversation she decided to give the patch prescribed yesterday a chance and see how she does with it.

## 2021-03-16 DIAGNOSIS — M9901 Segmental and somatic dysfunction of cervical region: Secondary | ICD-10-CM | POA: Diagnosis not present

## 2021-03-16 DIAGNOSIS — M4726 Other spondylosis with radiculopathy, lumbar region: Secondary | ICD-10-CM | POA: Diagnosis not present

## 2021-03-16 DIAGNOSIS — M9903 Segmental and somatic dysfunction of lumbar region: Secondary | ICD-10-CM | POA: Diagnosis not present

## 2021-03-16 DIAGNOSIS — M9902 Segmental and somatic dysfunction of thoracic region: Secondary | ICD-10-CM | POA: Diagnosis not present

## 2021-03-18 DIAGNOSIS — Z9181 History of falling: Secondary | ICD-10-CM | POA: Diagnosis not present

## 2021-03-18 DIAGNOSIS — M6281 Muscle weakness (generalized): Secondary | ICD-10-CM | POA: Diagnosis not present

## 2021-03-18 DIAGNOSIS — R260 Ataxic gait: Secondary | ICD-10-CM | POA: Diagnosis not present

## 2021-03-18 DIAGNOSIS — R296 Repeated falls: Secondary | ICD-10-CM | POA: Diagnosis not present

## 2021-03-23 DIAGNOSIS — Z87898 Personal history of other specified conditions: Secondary | ICD-10-CM | POA: Diagnosis not present

## 2021-03-23 DIAGNOSIS — Z20828 Contact with and (suspected) exposure to other viral communicable diseases: Secondary | ICD-10-CM | POA: Diagnosis not present

## 2021-03-23 DIAGNOSIS — Z9181 History of falling: Secondary | ICD-10-CM | POA: Diagnosis not present

## 2021-03-23 DIAGNOSIS — M6281 Muscle weakness (generalized): Secondary | ICD-10-CM | POA: Diagnosis not present

## 2021-03-23 DIAGNOSIS — R296 Repeated falls: Secondary | ICD-10-CM | POA: Diagnosis not present

## 2021-03-23 DIAGNOSIS — D509 Iron deficiency anemia, unspecified: Secondary | ICD-10-CM | POA: Diagnosis not present

## 2021-03-23 DIAGNOSIS — K449 Diaphragmatic hernia without obstruction or gangrene: Secondary | ICD-10-CM | POA: Diagnosis not present

## 2021-03-23 DIAGNOSIS — R260 Ataxic gait: Secondary | ICD-10-CM | POA: Diagnosis not present

## 2021-03-24 ENCOUNTER — Ambulatory Visit: Payer: Medicare Other

## 2021-03-25 ENCOUNTER — Other Ambulatory Visit: Payer: Self-pay

## 2021-03-25 ENCOUNTER — Inpatient Hospital Stay: Payer: Medicare Other | Attending: Hematology

## 2021-03-25 VITALS — BP 151/58 | HR 64 | Temp 97.7°F | Resp 17

## 2021-03-25 DIAGNOSIS — Z79890 Hormone replacement therapy (postmenopausal): Secondary | ICD-10-CM | POA: Insufficient documentation

## 2021-03-25 DIAGNOSIS — Z87891 Personal history of nicotine dependence: Secondary | ICD-10-CM | POA: Diagnosis not present

## 2021-03-25 DIAGNOSIS — N182 Chronic kidney disease, stage 2 (mild): Secondary | ICD-10-CM | POA: Diagnosis not present

## 2021-03-25 DIAGNOSIS — Z96653 Presence of artificial knee joint, bilateral: Secondary | ICD-10-CM | POA: Insufficient documentation

## 2021-03-25 DIAGNOSIS — E611 Iron deficiency: Secondary | ICD-10-CM | POA: Diagnosis not present

## 2021-03-25 DIAGNOSIS — Z90710 Acquired absence of both cervix and uterus: Secondary | ICD-10-CM | POA: Diagnosis not present

## 2021-03-25 DIAGNOSIS — D508 Other iron deficiency anemias: Secondary | ICD-10-CM

## 2021-03-25 DIAGNOSIS — M069 Rheumatoid arthritis, unspecified: Secondary | ICD-10-CM | POA: Diagnosis not present

## 2021-03-25 DIAGNOSIS — F32A Depression, unspecified: Secondary | ICD-10-CM | POA: Diagnosis not present

## 2021-03-25 DIAGNOSIS — D638 Anemia in other chronic diseases classified elsewhere: Secondary | ICD-10-CM | POA: Insufficient documentation

## 2021-03-25 DIAGNOSIS — Z96642 Presence of left artificial hip joint: Secondary | ICD-10-CM | POA: Insufficient documentation

## 2021-03-25 DIAGNOSIS — I129 Hypertensive chronic kidney disease with stage 1 through stage 4 chronic kidney disease, or unspecified chronic kidney disease: Secondary | ICD-10-CM | POA: Diagnosis not present

## 2021-03-25 DIAGNOSIS — D631 Anemia in chronic kidney disease: Secondary | ICD-10-CM | POA: Diagnosis not present

## 2021-03-25 MED ORDER — ACETAMINOPHEN 325 MG PO TABS
650.0000 mg | ORAL_TABLET | Freq: Once | ORAL | Status: AC
  Administered 2021-03-25: 650 mg via ORAL
  Filled 2021-03-25: qty 2

## 2021-03-25 MED ORDER — LORATADINE 10 MG PO TABS
10.0000 mg | ORAL_TABLET | Freq: Once | ORAL | Status: AC
  Administered 2021-03-25: 10 mg via ORAL
  Filled 2021-03-25: qty 1

## 2021-03-25 MED ORDER — SODIUM CHLORIDE 0.9 % IV SOLN
750.0000 mg | Freq: Once | INTRAVENOUS | Status: AC
  Administered 2021-03-25: 750 mg via INTRAVENOUS
  Filled 2021-03-25: qty 15

## 2021-03-25 NOTE — Progress Notes (Signed)
Patient tolerated IV iron infusion well, monitored for 30 minutes post infusion with no complaints. Ambulatory to lobby, VSS.

## 2021-03-25 NOTE — Patient Instructions (Signed)
Ferric carboxymaltose injection What is this medicine? FERRIC CARBOXYMALTOSE (ferr-ik car-box-ee-mol-toes) is an iron complex. Iron is used to make healthy red blood cells, which carry oxygen and nutrients throughout the body. This medicine is used to treat anemia in people with chronic kidney disease or people who cannot take iron by mouth. This medicine may be used for other purposes; ask your health care provider or pharmacist if you have questions. COMMON BRAND NAME(S): Injectafer What should I tell my health care provider before I take this medicine? They need to know if you have any of these conditions:  high levels of iron in the blood  liver disease  an unusual or allergic reaction to iron, other medicines, foods, dyes, or preservatives  pregnant or trying to get pregnant  breast-feeding How should I use this medicine? This medicine is for infusion into a vein. It is given by a health care professional in a hospital or clinic setting. Talk to your pediatrician regarding the use of this medicine in children. Special care may be needed. Overdosage: If you think you have taken too much of this medicine contact a poison control center or emergency room at once. NOTE: This medicine is only for you. Do not share this medicine with others. What if I miss a dose? Keep appointments for follow-up doses. It is important not to miss your dose. Call your care team if you are unable to keep an appointment. What may interact with this medicine? Do not take this medicine with any of the following medications:  deferoxamine  dimercaprol  other iron products This list may not describe all possible interactions. Give your health care provider a list of all the medicines, herbs, non-prescription drugs, or dietary supplements you use. Also tell them if you smoke, drink alcohol, or use illegal drugs. Some items may interact with your medicine. What should I watch for while using this  medicine? Visit your doctor or health care professional regularly. Tell your doctor if your symptoms do not start to get better or if they get worse. You may need blood work done while you are taking this medicine. You may need to follow a special diet. Talk to your doctor. Foods that contain iron include: whole grains/cereals, dried fruits, beans, or peas, leafy green vegetables, and organ meats (liver, kidney). What side effects may I notice from receiving this medicine? Side effects that you should report to your doctor or health care professional as soon as possible:  allergic reactions like skin rash, itching or hives, swelling of the face, lips, or tongue  dizziness  facial flushing Side effects that usually do not require medical attention (report to your doctor or health care professional if they continue or are bothersome):  changes in taste  constipation  headache  nausea, vomiting  pain, redness, or irritation at site where injected This list may not describe all possible side effects. Call your doctor for medical advice about side effects. You may report side effects to FDA at 1-800-FDA-1088. Where should I keep my medicine? This drug is given in a hospital or clinic and will not be stored at home. NOTE: This sheet is a summary. It may not cover all possible information. If you have questions about this medicine, talk to your doctor, pharmacist, or health care provider.  2021 Elsevier/Gold Standard (2020-02-12 14:00:47)  

## 2021-03-30 DIAGNOSIS — M6281 Muscle weakness (generalized): Secondary | ICD-10-CM | POA: Diagnosis not present

## 2021-03-30 DIAGNOSIS — Z20828 Contact with and (suspected) exposure to other viral communicable diseases: Secondary | ICD-10-CM | POA: Diagnosis not present

## 2021-03-30 DIAGNOSIS — Z9181 History of falling: Secondary | ICD-10-CM | POA: Diagnosis not present

## 2021-03-30 DIAGNOSIS — R260 Ataxic gait: Secondary | ICD-10-CM | POA: Diagnosis not present

## 2021-03-30 DIAGNOSIS — R296 Repeated falls: Secondary | ICD-10-CM | POA: Diagnosis not present

## 2021-04-01 DIAGNOSIS — N183 Chronic kidney disease, stage 3 unspecified: Secondary | ICD-10-CM | POA: Diagnosis not present

## 2021-04-01 DIAGNOSIS — E790 Hyperuricemia without signs of inflammatory arthritis and tophaceous disease: Secondary | ICD-10-CM | POA: Diagnosis not present

## 2021-04-01 DIAGNOSIS — E663 Overweight: Secondary | ICD-10-CM | POA: Diagnosis not present

## 2021-04-01 DIAGNOSIS — R768 Other specified abnormal immunological findings in serum: Secondary | ICD-10-CM | POA: Diagnosis not present

## 2021-04-01 DIAGNOSIS — Z6825 Body mass index (BMI) 25.0-25.9, adult: Secondary | ICD-10-CM | POA: Diagnosis not present

## 2021-04-01 DIAGNOSIS — M159 Polyosteoarthritis, unspecified: Secondary | ICD-10-CM | POA: Diagnosis not present

## 2021-04-01 DIAGNOSIS — R5383 Other fatigue: Secondary | ICD-10-CM | POA: Diagnosis not present

## 2021-04-01 DIAGNOSIS — L409 Psoriasis, unspecified: Secondary | ICD-10-CM | POA: Diagnosis not present

## 2021-04-01 DIAGNOSIS — M064 Inflammatory polyarthropathy: Secondary | ICD-10-CM | POA: Diagnosis not present

## 2021-04-02 DIAGNOSIS — Z9181 History of falling: Secondary | ICD-10-CM | POA: Diagnosis not present

## 2021-04-02 DIAGNOSIS — R296 Repeated falls: Secondary | ICD-10-CM | POA: Diagnosis not present

## 2021-04-02 DIAGNOSIS — R260 Ataxic gait: Secondary | ICD-10-CM | POA: Diagnosis not present

## 2021-04-02 DIAGNOSIS — M6281 Muscle weakness (generalized): Secondary | ICD-10-CM | POA: Diagnosis not present

## 2021-04-06 DIAGNOSIS — Z9181 History of falling: Secondary | ICD-10-CM | POA: Diagnosis not present

## 2021-04-06 DIAGNOSIS — R260 Ataxic gait: Secondary | ICD-10-CM | POA: Diagnosis not present

## 2021-04-06 DIAGNOSIS — Z20822 Contact with and (suspected) exposure to covid-19: Secondary | ICD-10-CM | POA: Diagnosis not present

## 2021-04-06 DIAGNOSIS — M6281 Muscle weakness (generalized): Secondary | ICD-10-CM | POA: Diagnosis not present

## 2021-04-06 DIAGNOSIS — R296 Repeated falls: Secondary | ICD-10-CM | POA: Diagnosis not present

## 2021-04-08 DIAGNOSIS — M4726 Other spondylosis with radiculopathy, lumbar region: Secondary | ICD-10-CM | POA: Diagnosis not present

## 2021-04-08 DIAGNOSIS — M9902 Segmental and somatic dysfunction of thoracic region: Secondary | ICD-10-CM | POA: Diagnosis not present

## 2021-04-08 DIAGNOSIS — M9901 Segmental and somatic dysfunction of cervical region: Secondary | ICD-10-CM | POA: Diagnosis not present

## 2021-04-08 DIAGNOSIS — M9903 Segmental and somatic dysfunction of lumbar region: Secondary | ICD-10-CM | POA: Diagnosis not present

## 2021-04-09 DIAGNOSIS — M6281 Muscle weakness (generalized): Secondary | ICD-10-CM | POA: Diagnosis not present

## 2021-04-09 DIAGNOSIS — Z9181 History of falling: Secondary | ICD-10-CM | POA: Diagnosis not present

## 2021-04-09 DIAGNOSIS — R296 Repeated falls: Secondary | ICD-10-CM | POA: Diagnosis not present

## 2021-04-09 DIAGNOSIS — R260 Ataxic gait: Secondary | ICD-10-CM | POA: Diagnosis not present

## 2021-04-13 DIAGNOSIS — Z9181 History of falling: Secondary | ICD-10-CM | POA: Diagnosis not present

## 2021-04-13 DIAGNOSIS — M6281 Muscle weakness (generalized): Secondary | ICD-10-CM | POA: Diagnosis not present

## 2021-04-13 DIAGNOSIS — R296 Repeated falls: Secondary | ICD-10-CM | POA: Diagnosis not present

## 2021-04-13 DIAGNOSIS — R260 Ataxic gait: Secondary | ICD-10-CM | POA: Diagnosis not present

## 2021-04-13 DIAGNOSIS — Z20828 Contact with and (suspected) exposure to other viral communicable diseases: Secondary | ICD-10-CM | POA: Diagnosis not present

## 2021-04-15 ENCOUNTER — Ambulatory Visit: Payer: Medicare Other | Admitting: Podiatry

## 2021-04-15 DIAGNOSIS — Z9181 History of falling: Secondary | ICD-10-CM | POA: Diagnosis not present

## 2021-04-15 DIAGNOSIS — R260 Ataxic gait: Secondary | ICD-10-CM | POA: Diagnosis not present

## 2021-04-15 DIAGNOSIS — R296 Repeated falls: Secondary | ICD-10-CM | POA: Diagnosis not present

## 2021-04-15 DIAGNOSIS — M6281 Muscle weakness (generalized): Secondary | ICD-10-CM | POA: Diagnosis not present

## 2021-04-17 ENCOUNTER — Other Ambulatory Visit: Payer: Self-pay

## 2021-04-17 ENCOUNTER — Ambulatory Visit (HOSPITAL_COMMUNITY)
Admission: EM | Admit: 2021-04-17 | Discharge: 2021-04-17 | Disposition: A | Discharge status: Home / Self Care | Payer: Medicare Other | Attending: Family Medicine | Admitting: Family Medicine

## 2021-04-17 DIAGNOSIS — R3911 Hesitancy of micturition: Secondary | ICD-10-CM | POA: Diagnosis not present

## 2021-04-17 DIAGNOSIS — R1084 Generalized abdominal pain: Secondary | ICD-10-CM

## 2021-04-17 DIAGNOSIS — R39198 Other difficulties with micturition: Secondary | ICD-10-CM | POA: Diagnosis not present

## 2021-04-17 LAB — BASIC METABOLIC PANEL
Anion gap: 8 (ref 5–15)
BUN: 31 mg/dL — ABNORMAL HIGH (ref 8–23)
CO2: 24 mmol/L (ref 22–32)
Calcium: 9.1 mg/dL (ref 8.9–10.3)
Chloride: 103 mmol/L (ref 98–111)
Creatinine, Ser: 1.08 mg/dL — ABNORMAL HIGH (ref 0.44–1.00)
GFR, Estimated: 49 mL/min — ABNORMAL LOW (ref >60)
Glucose, Bld: 99 mg/dL (ref 70–99)
Potassium: 5.1 mmol/L (ref 3.5–5.1)
Sodium: 135 mmol/L (ref 135–145)

## 2021-04-17 LAB — CBC WITH DIFFERENTIAL/PLATELET
Abs Immature Granulocytes: 0.04 10*3/uL (ref 0.00–0.07)
Basophils Absolute: 0.2 10*3/uL — ABNORMAL HIGH (ref 0.0–0.1)
Basophils Relative: 2 %
Eosinophils Absolute: 0.8 10*3/uL — ABNORMAL HIGH (ref 0.0–0.5)
Eosinophils Relative: 9 %
HCT: 32.7 % — ABNORMAL LOW (ref 36.0–46.0)
Hemoglobin: 10.5 g/dL — ABNORMAL LOW (ref 12.0–15.0)
Immature Granulocytes: 0 %
Lymphocytes Relative: 23 %
Lymphs Abs: 2.1 10*3/uL (ref 0.7–4.0)
MCH: 29.1 pg (ref 26.0–34.0)
MCHC: 32.1 g/dL (ref 30.0–36.0)
MCV: 90.6 fL (ref 80.0–100.0)
Monocytes Absolute: 1 10*3/uL (ref 0.1–1.0)
Monocytes Relative: 11 %
Neutro Abs: 4.9 10*3/uL (ref 1.7–7.7)
Neutrophils Relative %: 55 %
Platelets: 239 10*3/uL (ref 150–400)
RBC: 3.61 MIL/uL — ABNORMAL LOW (ref 3.87–5.11)
RDW: 15 % (ref 11.5–15.5)
WBC: 9.1 10*3/uL (ref 4.0–10.5)
nRBC: 0 % (ref 0.0–0.2)

## 2021-04-17 LAB — POCT URINALYSIS DIPSTICK, ED / UC
Bilirubin Urine: NEGATIVE
Glucose, UA: NEGATIVE mg/dL
Hgb urine dipstick: NEGATIVE
Ketones, ur: NEGATIVE mg/dL
Leukocytes,Ua: NEGATIVE
Nitrite: NEGATIVE
Protein, ur: NEGATIVE mg/dL
Specific Gravity, Urine: 1.01 (ref 1.005–1.030)
Urobilinogen, UA: 0.2 mg/dL (ref 0.0–1.0)
pH: 5 (ref 5.0–8.0)

## 2021-04-17 NOTE — ED Provider Notes (Signed)
[name]    CSN: [id number] Arrival date & time: 04/17/21  1134      History   Chief Complaint No chief complaint on file.   HPI [name] is a 88 y.o. female.   Patient is here for low abd pain, bloating, but hesitancy.  She denies any pain.  She is urinating less frequency.  She is drinking enough fluids.  She feels she needs to urinate again.  She started allopurinol for gout, and wonders if that is related to these symptoms.  No fevers or chills.  No n/v.  No back pain.   Past Medical History:  Diagnosis Date   Anemia    CHRONIC   Aortic insufficiency    a. 2D echo 08/29/15: EF 55-60%, mild LVH, diastolic dysfunction, elevated LV filling pressure, mild AI, severe LAE, mild RAE, mild TR, dilated descending thoracic aorta just distal to the takeoff of the left subclavian, measuring 4.1 cm, no pericardial effusion.   Arthritis    OA-SOME BACK AND NECK PAIN,  GOES TO CHIROPRACTOR TWICE A MONTH;  HX OF JOINT REPLACEMENTS    Cancer ([place])    SKIN CANCERS REMOVED FROM LEGS   CKD (chronic kidney disease), stage II    Depression    Essential hypertension    Gait abnormality 09/08/2017   GERD (gastroesophageal reflux disease)    Low sodium levels    Myocarditis (HCC)    PSVT (paroxysmal supraventricular tachycardia) (HCC)    Recurrent Pericarditis    RECURRENT    Thoracic aneurysm    a. Followed by [facility] -last seen in 12/2013 w/ plan for f/u MRA and thoracic surgery f/u in 2 yrs, but patient does not wish to follow any longer.    Patient Active Problem List   Diagnosis Date Noted   Adjustment disorder with mixed disturbance of emotions and conduct 01/20/2021   Severe recurrent major depression without psychotic features ([place]) 01/19/2021   Pincer nail deformity 12/01/2020   Iron deficiency anemia 03/24/2020   Clavi 02/19/2020   Lumbar radiculopathy 06/17/2019   Pain due to onychomycosis of toenails of both feet 05/14/2019   Mixed incontinence  03/10/2019   Cervical spondylosis 06/07/2018   DDD (degenerative disc disease), cervical 05/20/2018   History of total knee replacement, left 04/13/2018   History of total knee replacement, right 04/13/2018   History of revision of total replacement of left hip joint 04/13/2018   Gait abnormality 09/08/2017   Chronic cystitis 07/05/2017   Dysuria 07/05/2017   Anemia 09/22/2015   h/o pericarditis    Essential hypertension    Aortic insufficiency    Posterior vitreous detachment, bilateral 06/05/2015   Left wrist pain 05/16/2015   Triquetral chip fracture, left, closed, initial encounter 05/16/2015   Primary osteoarthritis of first carpometacarpal joint of right hand 02/11/2015   Primary osteoarthritis of right wrist 02/11/2015   Arthropathy 11/25/2014   Subluxation 11/25/2014   Hip dislocation, left ([place]) 12/05/2013   Trochanteric bursitis of left hip 11/24/2012   Aneurysm of thoracic aorta (HCC)    Nonexudative age-related macular degeneration 04/22/2011   Posterior capsular opacification, right 04/22/2011   Status post intraocular lens implant 04/22/2011    Past Surgical History:  Procedure Laterality Date   APPENDECTOMY     BREAST EXCISIONAL BIOPSY Left 1992   benign   EXCISION/RELEASE BURSA HIP Left 11/24/2012   Procedure: LEFT HIP BURSECTOMY AND TENDON REPAIR ;  Surgeon: Gearlean Alf, MD;  Location: [facility];  Service: Orthopedics;  Laterality: Left;   HIP CLOSED REDUCTION Left 12/19/2012   Procedure: CLOSED MANIPULATION HIP;  Surgeon: Mauri Pole, MD;  Location: [facility];  Service: Orthopedics;  Laterality: Left;   HIP CLOSED REDUCTION Left 12/05/2013   Procedure: CLOSED MANIPULATION HIP;  Surgeon: Augustin Schooling, MD;  Location: [facility];  Service: Orthopedics;  Laterality: Left;   HIP CLOSED REDUCTION Left 09/22/2015   Procedure: CLOSED REDUCTION HIP;  Surgeon: Melina Schools, MD;  Location: [facility];  Service: Orthopedics;  Laterality: Left;   JOINT REPLACEMENT     LEFT  TOTAL HIP REPLACEMENT AND REVISIONS X 2   OOPHORECTOMY     has a partial of one ovary remaingin   PELVIC LAPAROSCOPY     ovarian cyst removal,    REVISION TOTAL HIP ARTHROPLASTY     left   right Achilles Tendon repair  5/13   TOTAL KNEE ARTHROPLASTY     right   TOTAL KNEE ARTHROPLASTY     left   VAGINAL HYSTERECTOMY     with ovarian cyst removal    OB History     Gravida  2   Para  2   Term      Preterm      AB      Living  2      SAB      IAB      Ectopic      Multiple      Live Births               Home Medications    Prior to Admission medications   Medication Sig Start Date End Date Taking? Authorizing Provider  amoxicillin (AMOXIL) 500 MG capsule Take 2,000 mg by mouth See admin instructions. 1 hour prior to dental appointment Patient not taking: Reported on 03/11/2021 04/10/20   [provider]  aspirin 81 MG tablet Take 81 mg by mouth every evening.    [provider]  betamethasone dipropionate 0.05 % cream Apply topically 2 (two) times daily. Applied to the right ear canal twice daily for 4 to 5 days or until itching resolves. Patient not taking: Reported on 03/11/2021 12/22/20   Rozetta Nunnery, MD  budesonide (ENTOCORT EC) 3 MG 24 hr capsule Take 3 mg by mouth daily. 03/28/20   [provider]  Calcium Carbonate-Vit D-Min (CALCIUM 1200 PO) Take 1 tablet by mouth every evening.    [provider]  carboxymethylcellulose (REFRESH PLUS) 0.5 % SOLN Place 1 drop into both eyes in the morning and at bedtime.    [provider]  carvedilol (COREG) 12.5 MG tablet Take 1 tablet (12.5 mg total) by mouth 2 (two) times daily with a meal. 1/2 in the Am; 1 in the PM Patient taking differently: Take by mouth. 6.25 mg in the morning. 12.5 mg in the evening 01/14/21   Belva Crome, MD  cholecalciferol (VITAMIN D) 1000 UNITS tablet Take 1,000 Units by mouth daily. D3    [provider]  clobetasol  cream (TEMOVATE) 4.01 % Apply 1 application topically 2 (two) times daily as needed (itching).  08/08/13   [provider]  diltiazem (CARDIZEM CD) 240 MG 24 hr capsule Take 1 capsule (240 mg total) by mouth daily. 02/10/21   Belva Crome, MD  estradiol (VIVELLE-DOT) 0.025 MG/24HR Place 1 patch onto the skin 2 (two) times a week. 03/12/21   Nunzio Cobbs, MD  ferrous sulfate 325 (65 FE) MG tablet  Take 325 mg by mouth daily. 01/24/20   [provider]  meloxicam (MOBIC) 7.5 MG tablet Take 7.5 mg by mouth daily as needed for pain.  03/21/15   [provider]  mupirocin ointment (BACTROBAN) 2 % Apply 1 application topically See admin instructions. Apply to leg after showers 01/13/21   [provider]  omega-3 acid ethyl esters (LOVAZA) 1 g capsule Take 1 g by mouth daily.     [provider]  sodium chloride 1 g tablet Take 1 g by mouth daily. 08/21/19   [provider]  tretinoin (RETIN-A) 0.1 % cream Apply 1 application topically at bedtime as needed (breakouts). 04/29/20   [provider]    Family History Family History  Problem Relation Age of Onset   Hypertension Mother    Stroke Mother    Heart attack Father 45   Heart failure Father    Heart failure Sister    Aortic aneurysm Sister    Aortic aneurysm Sister    Stroke Sister    Pneumonia Brother    Stroke Brother    ALS Other    Hyperlipidemia Daughter    Aortic aneurysm Son    Breast cancer Neg Hx     Social History Social History   Tobacco Use   Smoking status: Former    Years: 5.00    Types: Cigarettes    Quit date: 1965    Years since quitting: 30.1   Smokeless tobacco: Never   Tobacco comments:    quit age 68  Vaping Use   Vaping Use: Never used  Substance Use Topics   Alcohol use: No    Alcohol/week: 0.0 standard drinks   Drug use: Not Currently     Allergies   Cephalexin, Shellfish allergy, Shellfish-derived products,  Clindamycin/lincomycin, Lincomycin, Other, Penicillamine, Sugar-protein-starch, Tape, Ciprofloxacin, Sulfa antibiotics, and Sulfites   Review of Systems Review of Systems  Constitutional: Negative.   HENT: Negative.    Respiratory: Negative.    Cardiovascular: Negative.   Gastrointestinal:  Positive for abdominal pain.  Genitourinary:  Positive for decreased urine volume.    Physical Exam Triage Vital Signs ED Triage Vitals [04/17/21 1306]  Enc Vitals Group     BP (!) 182/84     Pulse Rate 76     Resp 16     Temp 98.6 F (37 C)     Temp Source Oral     SpO2 100 %     Weight      Height      Head Circumference      Peak Flow      Pain Score 7     Pain Loc      Pain Edu?      Excl. in [place]?    No data found.  Updated Vital Signs BP (!) 182/84 (BP Location: Left Arm)    Pulse 76    Temp 98.6 F (37 C) (Oral)    Resp 16    SpO2 100%   Visual Acuity Right Eye Distance:   Left Eye Distance:   Bilateral Distance:    Right Eye Near:   Left Eye Near:    Bilateral Near:     Physical Exam Constitutional:      Appearance: Normal appearance.  Cardiovascular:     Rate and Rhythm: Normal rate and regular rhythm.  Pulmonary:     Effort: Pulmonary effort is normal.     Breath sounds: Normal breath sounds.  Abdominal:  Palpations: Abdomen is soft.     Tenderness: There is abdominal tenderness in the suprapubic area. There is no right CVA tenderness or left CVA tenderness.  Neurological:     Mental Status: She is alert.     UC Treatments / Results  Labs (all labs ordered are listed, but only abnormal results are displayed) Labs Reviewed  URINE CULTURE  CBC WITH DIFFERENTIAL/PLATELET  BASIC METABOLIC PANEL  POCT URINALYSIS DIPSTICK, ED / UC    EKG   Radiology No results found.  Procedures Procedures (including critical care time)  Medications Ordered in UC Medications - No data to display  Initial Impression / Assessment and Plan / UC Course  I have  reviewed the triage vital signs and the nursing notes.  Pertinent labs & imaging results that were available during my care of the patient were reviewed by me and considered in my medical decision making (see chart for details).   Patient was seen today for abdominal pain and urinary difficulty.  No pain or frequency.  Her urine is normal today.  She wonders if due to new medications she has recently started, one of which is allopurinol, unsure about the other.  At this time, I have asked her to stop these medications (for gout) and have ordered lab work.  I have asked her to increase fluid intake and await test results.  Will treat for uti if culture indicates.  If she has worsening symptoms over the weekend to please go to the ER for evaluation.   Final Clinical Impressions(s) / UC Diagnoses   Final diagnoses:  Difficulty in urination  Generalized abdominal pain  Urinary hesitancy     Discharge Instructions      You were seen today for abdominal pain and difficulty of urination.  Your urine sample is normal today.  As a result, we will send your urine for culture and hold off on treatment at this time.  If you urine grows bacteria we will call and treat.  For now, please stop the allopurinol as this may be causing some of your symptoms.  Please increase fluid intake.  I have also ordered lab work and this will be resulted this weekend.  If, over the weekend, you have worsening symptoms such has difficulty with urination, nausea/vomiting, fever, then please go to the ER for evaluation.     ED Prescriptions   None    PDMP not reviewed this encounter.   Rondel Oh, MD 04/17/21 1340

## 2021-04-17 NOTE — Discharge Instructions (Addendum)
You were seen today for abdominal pain and difficulty of urination.  Your urine sample is normal today.  As a result, we will send your urine for culture and hold off on treatment at this time.  If you urine grows bacteria we will call and treat.  For now, please stop the allopurinol as this may be causing some of your symptoms.  Please increase fluid intake.  I have also ordered lab work and this will be resulted this weekend.  If, over the weekend, you have worsening symptoms such has difficulty with urination, nausea/vomiting, fever, then please go to the ER for evaluation.

## 2021-04-17 NOTE — ED Triage Notes (Signed)
Pt presents for lower abdominal pain and dysuria x 2 days.

## 2021-04-18 LAB — URINE CULTURE: Culture: NO GROWTH

## 2021-04-20 ENCOUNTER — Other Ambulatory Visit: Payer: Self-pay

## 2021-04-20 ENCOUNTER — Encounter: Payer: Self-pay | Admitting: Podiatry

## 2021-04-20 ENCOUNTER — Ambulatory Visit (INDEPENDENT_AMBULATORY_CARE_PROVIDER_SITE_OTHER): Payer: Medicare Other | Admitting: Podiatry

## 2021-04-20 DIAGNOSIS — M79674 Pain in right toe(s): Secondary | ICD-10-CM

## 2021-04-20 DIAGNOSIS — R531 Weakness: Secondary | ICD-10-CM | POA: Insufficient documentation

## 2021-04-20 DIAGNOSIS — E790 Hyperuricemia without signs of inflammatory arthritis and tophaceous disease: Secondary | ICD-10-CM | POA: Insufficient documentation

## 2021-04-20 DIAGNOSIS — B351 Tinea unguium: Secondary | ICD-10-CM | POA: Diagnosis not present

## 2021-04-20 DIAGNOSIS — L608 Other nail disorders: Secondary | ICD-10-CM

## 2021-04-20 DIAGNOSIS — R5383 Other fatigue: Secondary | ICD-10-CM | POA: Insufficient documentation

## 2021-04-20 DIAGNOSIS — M2031 Hallux varus (acquired), right foot: Secondary | ICD-10-CM

## 2021-04-20 DIAGNOSIS — M79675 Pain in left toe(s): Secondary | ICD-10-CM

## 2021-04-20 DIAGNOSIS — N183 Chronic kidney disease, stage 3 unspecified: Secondary | ICD-10-CM | POA: Insufficient documentation

## 2021-04-20 DIAGNOSIS — L84 Corns and callosities: Secondary | ICD-10-CM

## 2021-04-20 DIAGNOSIS — M064 Inflammatory polyarthropathy: Secondary | ICD-10-CM | POA: Insufficient documentation

## 2021-04-20 DIAGNOSIS — M1991 Primary osteoarthritis, unspecified site: Secondary | ICD-10-CM | POA: Insufficient documentation

## 2021-04-20 NOTE — Progress Notes (Signed)
This patient returns to the office for evaluation and treatment of long thick painful nails .  This patient is unable to trim his own nails since the patient cannot reach his feet.  Patient says the nails are painful walking and wearing his shoes.   She has painful callus on the bottom of her right big toe. She returns for preventive foot care services.  General Appearance  Alert, conversant and in no acute stress.  Vascular  Dorsalis pedis and posterior tibial  pulses are palpable  bilaterally.  Capillary return is within normal limits  bilaterally. Temperature is within normal limits  bilaterally.  Neurologic  Senn-Weinstein monofilament wire test within normal limits  bilaterally. Muscle power within normal limits bilaterally.  Nails Thick disfigured discolored nails with subungual debris  from hallux to fifth toes bilaterally. No evidence of bacterial infection or drainage bilaterally. Pincer fourth toenails  B/l.  Orthopedic  No limitations of motion  feet .  No crepitus or effusions noted.  No bony pathology or digital deformities noted. 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  Skin  normotropic  bilaterally.  No signs of infections or ulcers noted.     Clavi 3rd toe left foot. symptomatic. Asymptomatic callus right hallux  Onychomycosis  Pain in toes right foot  Pain in toes left foot  Callus right foot.  Debridement  of nails  1-5  B/L with a nail nipper.  Nails were then filed using a dremel tool with no incidents.      RTC 9 weeks     Gardiner Barefoot DPM

## 2021-04-22 ENCOUNTER — Other Ambulatory Visit: Payer: Medicare Other

## 2021-04-22 DIAGNOSIS — M1009 Idiopathic gout, multiple sites: Secondary | ICD-10-CM | POA: Diagnosis not present

## 2021-04-22 DIAGNOSIS — Z719 Counseling, unspecified: Secondary | ICD-10-CM

## 2021-04-22 DIAGNOSIS — Z6826 Body mass index (BMI) 26.0-26.9, adult: Secondary | ICD-10-CM | POA: Diagnosis not present

## 2021-04-22 DIAGNOSIS — R768 Other specified abnormal immunological findings in serum: Secondary | ICD-10-CM | POA: Diagnosis not present

## 2021-04-22 DIAGNOSIS — M064 Inflammatory polyarthropathy: Secondary | ICD-10-CM | POA: Diagnosis not present

## 2021-04-22 DIAGNOSIS — M159 Polyosteoarthritis, unspecified: Secondary | ICD-10-CM | POA: Diagnosis not present

## 2021-04-22 DIAGNOSIS — L409 Psoriasis, unspecified: Secondary | ICD-10-CM | POA: Diagnosis not present

## 2021-04-22 DIAGNOSIS — E663 Overweight: Secondary | ICD-10-CM | POA: Diagnosis not present

## 2021-04-27 DIAGNOSIS — Z85828 Personal history of other malignant neoplasm of skin: Secondary | ICD-10-CM | POA: Diagnosis not present

## 2021-04-27 DIAGNOSIS — I8312 Varicose veins of left lower extremity with inflammation: Secondary | ICD-10-CM | POA: Diagnosis not present

## 2021-04-27 DIAGNOSIS — L309 Dermatitis, unspecified: Secondary | ICD-10-CM | POA: Diagnosis not present

## 2021-04-27 DIAGNOSIS — L245 Irritant contact dermatitis due to other chemical products: Secondary | ICD-10-CM | POA: Diagnosis not present

## 2021-04-27 DIAGNOSIS — I8311 Varicose veins of right lower extremity with inflammation: Secondary | ICD-10-CM | POA: Diagnosis not present

## 2021-04-27 DIAGNOSIS — I872 Venous insufficiency (chronic) (peripheral): Secondary | ICD-10-CM | POA: Diagnosis not present

## 2021-04-28 ENCOUNTER — Telehealth: Payer: Self-pay | Admitting: *Deleted

## 2021-04-28 NOTE — Telephone Encounter (Signed)
Left message for patient to call.

## 2021-04-28 NOTE — Telephone Encounter (Signed)
If the patch is falling off, I would recommend discontinuing the estrogen completely.    The patch is a safer way to prescribe the estrogen treatment than taking a pill.  I have concerns about risk of cardiovascular complications with her on estrogen treatment.    She can also choose to discuss her estrogen therapy with her PCP.

## 2021-04-28 NOTE — Telephone Encounter (Signed)
Patient called states she would like to stop the vivelle dot patch and start on pill form. Patient said the patch is too much to remember to change twice weekly, it falls off sometimes. She would much prefer a pill. Please advise

## 2021-04-29 NOTE — Telephone Encounter (Signed)
Patient informed she doesn't want to stop estrogen. Patient said she will continue the patch.

## 2021-04-29 NOTE — Telephone Encounter (Signed)
Encounter reviewed and closed.  

## 2021-05-04 DIAGNOSIS — M4726 Other spondylosis with radiculopathy, lumbar region: Secondary | ICD-10-CM | POA: Diagnosis not present

## 2021-05-04 DIAGNOSIS — M9902 Segmental and somatic dysfunction of thoracic region: Secondary | ICD-10-CM | POA: Diagnosis not present

## 2021-05-04 DIAGNOSIS — M9903 Segmental and somatic dysfunction of lumbar region: Secondary | ICD-10-CM | POA: Diagnosis not present

## 2021-05-04 DIAGNOSIS — M9901 Segmental and somatic dysfunction of cervical region: Secondary | ICD-10-CM | POA: Diagnosis not present

## 2021-05-13 ENCOUNTER — Ambulatory Visit: Payer: Medicare Other | Admitting: Podiatry

## 2021-05-18 ENCOUNTER — Other Ambulatory Visit: Payer: Medicare Other

## 2021-05-18 ENCOUNTER — Other Ambulatory Visit: Payer: Self-pay

## 2021-05-18 DIAGNOSIS — D649 Anemia, unspecified: Secondary | ICD-10-CM

## 2021-05-18 DIAGNOSIS — Z20822 Contact with and (suspected) exposure to covid-19: Secondary | ICD-10-CM | POA: Diagnosis not present

## 2021-05-18 LAB — CBC
HCT: 31.7 % — ABNORMAL LOW (ref 35.0–45.0)
Hemoglobin: 10 g/dL — ABNORMAL LOW (ref 11.7–15.5)
MCH: 28.3 pg (ref 27.0–33.0)
MCHC: 31.5 g/dL — ABNORMAL LOW (ref 32.0–36.0)
MCV: 89.8 fL (ref 80.0–100.0)
MPV: 10.7 fL (ref 7.5–12.5)
Platelets: 251 10*3/uL (ref 140–400)
RBC: 3.53 10*6/uL — ABNORMAL LOW (ref 3.80–5.10)
RDW: 14.3 % (ref 11.0–15.0)
WBC: 8.9 10*3/uL (ref 3.8–10.8)

## 2021-05-20 DIAGNOSIS — M25552 Pain in left hip: Secondary | ICD-10-CM | POA: Diagnosis not present

## 2021-05-20 DIAGNOSIS — M064 Inflammatory polyarthropathy: Secondary | ICD-10-CM | POA: Diagnosis not present

## 2021-05-20 DIAGNOSIS — Z96642 Presence of left artificial hip joint: Secondary | ICD-10-CM | POA: Diagnosis not present

## 2021-05-26 DIAGNOSIS — Z719 Counseling, unspecified: Secondary | ICD-10-CM

## 2021-06-01 DIAGNOSIS — I872 Venous insufficiency (chronic) (peripheral): Secondary | ICD-10-CM | POA: Diagnosis not present

## 2021-06-01 DIAGNOSIS — D692 Other nonthrombocytopenic purpura: Secondary | ICD-10-CM | POA: Diagnosis not present

## 2021-06-01 DIAGNOSIS — I8312 Varicose veins of left lower extremity with inflammation: Secondary | ICD-10-CM | POA: Diagnosis not present

## 2021-06-01 DIAGNOSIS — I8311 Varicose veins of right lower extremity with inflammation: Secondary | ICD-10-CM | POA: Diagnosis not present

## 2021-06-01 DIAGNOSIS — Z20822 Contact with and (suspected) exposure to covid-19: Secondary | ICD-10-CM | POA: Diagnosis not present

## 2021-06-01 DIAGNOSIS — Z85828 Personal history of other malignant neoplasm of skin: Secondary | ICD-10-CM | POA: Diagnosis not present

## 2021-06-08 DIAGNOSIS — M9903 Segmental and somatic dysfunction of lumbar region: Secondary | ICD-10-CM | POA: Diagnosis not present

## 2021-06-08 DIAGNOSIS — M4726 Other spondylosis with radiculopathy, lumbar region: Secondary | ICD-10-CM | POA: Diagnosis not present

## 2021-06-08 DIAGNOSIS — M9902 Segmental and somatic dysfunction of thoracic region: Secondary | ICD-10-CM | POA: Diagnosis not present

## 2021-06-08 DIAGNOSIS — M9901 Segmental and somatic dysfunction of cervical region: Secondary | ICD-10-CM | POA: Diagnosis not present

## 2021-06-10 ENCOUNTER — Other Ambulatory Visit: Payer: Self-pay

## 2021-06-10 ENCOUNTER — Ambulatory Visit (INDEPENDENT_AMBULATORY_CARE_PROVIDER_SITE_OTHER): Payer: Medicare Other | Admitting: Family Medicine

## 2021-06-10 ENCOUNTER — Encounter: Payer: Self-pay | Admitting: Family Medicine

## 2021-06-10 VITALS — BP 126/82 | HR 69 | Temp 96.8°F | Wt 149.2 lb

## 2021-06-10 DIAGNOSIS — D508 Other iron deficiency anemias: Secondary | ICD-10-CM | POA: Diagnosis not present

## 2021-06-10 DIAGNOSIS — N1831 Chronic kidney disease, stage 3a: Secondary | ICD-10-CM

## 2021-06-10 DIAGNOSIS — D649 Anemia, unspecified: Secondary | ICD-10-CM

## 2021-06-10 DIAGNOSIS — I351 Nonrheumatic aortic (valve) insufficiency: Secondary | ICD-10-CM | POA: Diagnosis not present

## 2021-06-10 NOTE — Progress Notes (Signed)
? ? ?Provider:  ?Alain Honey, MD ? ?Careteam: ?Patient Care Team: ?Wardell Honour, MD as PCP - General (Family Medicine) ?Belva Crome, MD as PCP - Cardiology (Cardiology) ? ?PLACE OF SERVICE:  ?[facility]  ?Advanced Directive information ?  ? ?Allergies  ?Allergen Reactions  ? Cephalexin Other (See Comments)  ?  Pt states this causes eye problems and STROKE-LIKE SYMPTOMS!! ?Other reaction(s): stroke symptoms  ? Shellfish Allergy Hives and Rash  ? Shellfish-Derived Products Hives and Rash  ?  Other reaction(s): hives  ? Clindamycin/Lincomycin Rash  ? Lincomycin Rash  ? Other   ?  Other reaction(s): rash  ? Penicillamine   ? Sugar-Protein-Starch Other (See Comments)  ?  Sugary foods cause legs to cramp  ? Tape Other (See Comments)  ?  Please use paper tape or Coban Wrap; patient's skin tears VERY easily!!  ? Ciprofloxacin Rash  ?  Other reaction(s): rash  ? Sulfa Antibiotics Hives and Rash  ? Sulfites Hives and Rash  ?  Other reaction(s): swelling  ? ? ?Chief Complaint  ?Patient presents with  ? Acute Visit  ?  Patient presents today for to discuss possible starting a medication for pain. She reports that she is starting to experience   ? ? ? ?HPI: Patient is a 88 y.o. female resident of [facility] who is here for medical management of chronic problems including aortic aneurysm, chronic kidney disease, gout, and anemia.  Since her last visit here she had flareup of gout that was treated with colchicine and allopurinol.  She is wondering why she cannot stay on budesonide that has been used for her inflammatory bowel disease for gout.  I explained that steroids are not generally used long-term for chronic disease. ?She is also seen hematologist and given iron infusion.  Hemoglobin was 7.3 is now 10.  She still feels some malaise and fatigue but is improved.  Also realizes that her age may have some role in her energy levels. ?Memory is working well; no complaints there. ? ?Review of Systems:  ?Review of  Systems  ?Constitutional:  Positive for malaise/fatigue.  ?HENT: Negative.    ?Respiratory: Negative.    ?Cardiovascular: Negative.   ?Genitourinary: Negative.   ?Musculoskeletal:  Positive for joint pain. Negative for falls.  ?Neurological: Negative.   ?Psychiatric/Behavioral: Negative.    ?All other systems reviewed and are negative. ? ?Past Medical History:  ?Diagnosis Date  ? Anemia   ? CHRONIC  ? Aortic insufficiency   ? a. 2D echo 08/29/15: EF 55-60%, mild LVH, diastolic dysfunction, elevated LV filling pressure, mild AI, severe LAE, mild RAE, mild TR, dilated descending thoracic aorta just distal to the takeoff of the left subclavian, measuring 4.1 cm, no pericardial effusion.  ? Arthritis   ? OA-SOME BACK AND NECK PAIN,  GOES TO CHIROPRACTOR TWICE A MONTH;  HX OF JOINT REPLACEMENTS   ? Cancer [facility])   ? SKIN CANCERS REMOVED FROM LEGS  ? CKD (chronic kidney disease), stage II   ? Depression   ? Essential hypertension   ? Gait abnormality 09/08/2017  ? GERD (gastroesophageal reflux disease)   ? Low sodium levels   ? Myocarditis ([place])   ? PSVT (paroxysmal supraventricular tachycardia) ([place])   ? Recurrent Pericarditis   ? RECURRENT   ? Thoracic aneurysm   ? a. Followed by [facility] -last seen in 12/2013 w/ plan for f/u MRA and thoracic surgery f/u in 2 yrs, but patient does not wish to follow any longer.  ? ?  Past Surgical History:  ?Procedure Laterality Date  ? APPENDECTOMY    ? BREAST EXCISIONAL BIOPSY Left 1992  ? benign  ? EXCISION/RELEASE BURSA HIP Left 11/24/2012  ? Procedure: LEFT HIP BURSECTOMY AND TENDON REPAIR ;  Surgeon: Gearlean Alf, MD;  [facility];  Service: Orthopedics;  Laterality: Left;  ? HIP CLOSED REDUCTION Left 12/19/2012  ? Procedure: CLOSED MANIPULATION HIP;  Surgeon: Mauri Pole, MD;  [facility];  Service: Orthopedics;  Laterality: Left;  ? HIP CLOSED REDUCTION Left 12/05/2013  ? Procedure: CLOSED MANIPULATION HIP;  Surgeon: Augustin Schooling, MD;  [facility];  Service:  Orthopedics;  Laterality: Left;  ? HIP CLOSED REDUCTION Left 09/22/2015  ? Procedure: CLOSED REDUCTION HIP;  Surgeon: Melina Schools, MD;  [facility];  Service: Orthopedics;  Laterality: Left;  ? JOINT REPLACEMENT    ? LEFT TOTAL HIP REPLACEMENT AND REVISIONS X 2  ? OOPHORECTOMY    ? has a partial of one ovary remaingin  ? PELVIC LAPAROSCOPY    ? ovarian cyst removal,   ? REVISION TOTAL HIP ARTHROPLASTY    ? left  ? right Achilles Tendon repair  5/13  ? TOTAL KNEE ARTHROPLASTY    ? right  ? TOTAL KNEE ARTHROPLASTY    ? left  ? VAGINAL HYSTERECTOMY    ? with ovarian cyst removal  ? ?Social History: ?  reports that she quit smoking about 58 years ago. Her smoking use included cigarettes. She has never used smokeless tobacco. She reports that she does not currently use drugs. She reports that she does not drink alcohol. ? ?Family History  ?Problem Relation Age of Onset  ? Hypertension Mother   ? Stroke Mother   ? Heart attack Father 13  ? Heart failure Father   ? Heart failure Sister   ? Aortic aneurysm Sister   ? Aortic aneurysm Sister   ? Stroke Sister   ? Pneumonia Brother   ? Stroke Brother   ? ALS Other   ? Hyperlipidemia Daughter   ? Aortic aneurysm Son   ? Breast cancer Neg Hx   ? ? ?Medications: ?Patient's Medications  ?New Prescriptions  ? No medications on file  ?Previous Medications  ? ACETAMINOPHEN (ACETAMINOPHEN EXTRA STRENGTH) 500 MG CAPSULE    1 capsule as needed  ? ALLOPURINOL (ZYLOPRIM) 100 MG TABLET    1 tablet  ? AMOXICILLIN (AMOXIL) 500 MG CAPSULE    Take 2,000 mg by mouth See admin instructions. 1 hour prior to dental appointment  ? ASPIRIN 81 MG TABLET    Take 81 mg by mouth every evening.  ? B COMPLEX VITAMINS (VITAMIN B COMPLEX) TABS    1 tablet  ? BETAMETHASONE DIPROPIONATE 0.05 % CREAM    Apply topically 2 (two) times daily. Applied to the right ear canal twice daily for 4 to 5 days or until itching resolves.  ? BUDESONIDE (ENTOCORT EC) 3 MG 24 HR CAPSULE    Take 3 mg by mouth daily.  ?  CALCIUM CARBONATE-VIT D-MIN (CALCIUM 1200 PO)    Take 1 tablet by mouth every evening.  ? CARBOXYMETHYLCELLULOSE (REFRESH PLUS) 0.5 % SOLN    Place 1 drop into both eyes in the morning and at bedtime.  ? CARVEDILOL (COREG) 12.5 MG TABLET    Take 1 tablet (12.5 mg total) by mouth 2 (two) times daily with a meal. 1/2 in the Am; 1 in the PM  ? CHOLECALCIFEROL (VITAMIN D) 1000 UNITS TABLET  Take 1,000 Units by mouth daily. D3  ? CLOBETASOL CREAM (TEMOVATE) 0.05 %    Apply 1 application topically 2 (two) times daily as needed (itching).   ? COLCHICINE 0.6 MG CAPS    1 capsule  ? DILTIAZEM (CARDIZEM CD) 240 MG 24 HR CAPSULE    Take 1 capsule (240 mg total) by mouth daily.  ? DILTIAZEM (DILACOR XR) 240 MG 24 HR CAPSULE    1 capsule  ? ESTRADIOL (VIVELLE-DOT) 0.025 MG/24HR    Place 1 patch onto the skin 2 (two) times a week.  ? FERROUS SULFATE 325 (65 FE) MG TABLET    Take 325 mg by mouth daily.  ? MELOXICAM (MOBIC) 7.5 MG TABLET    Take 7.5 mg by mouth daily as needed for pain.   ? MUPIROCIN OINTMENT (BACTROBAN) 2 %    Apply 1 application topically See admin instructions. Apply to leg after showers  ? OMEGA-3 ACID ETHYL ESTERS (LOVAZA) 1 G CAPSULE    Take 1 g by mouth daily.   ? SODIUM CHLORIDE 1 G TABLET    Take 1 g by mouth daily.  ? SODIUM CHLORIDE 1 G TABLET    1 tablet  ? TRETINOIN (RETIN-A) 0.1 % CREAM    Apply 1 application topically at bedtime as needed (breakouts).  ?Modified Medications  ? No medications on file  ?Discontinued Medications  ? No medications on file  ? ? ?Physical Exam: ? ?Vitals:  ? 06/10/21 1054  ?BP: 126/82  ?Pulse: 69  ?Temp: (!) 96.8 ?F (36 ?C)  ?SpO2: 97%  ?Weight: 149 lb 3.2 oz (67.7 kg)  ? ?Body mass index is 26.64 kg/m?. ?Wt Readings from Last 3 Encounters:  ?06/10/21 149 lb 3.2 oz (67.7 kg)  ?03/11/21 153 lb (69.4 kg)  ?02/24/21 151 lb 6.4 oz (68.7 kg)  ? ? ?Physical Exam ?Vitals and nursing note reviewed.  ?Constitutional:   ?   Appearance: Normal appearance.  ?Cardiovascular:  ?    Rate and Rhythm: Normal rate and regular rhythm.  ?   Heart sounds: Murmur heard.  ?Pulmonary:  ?   Effort: Pulmonary effort is normal.  ?   Breath sounds: Normal breath sounds.  ?Abdominal:  ?   General: Abdomen is

## 2021-06-22 ENCOUNTER — Encounter: Payer: Self-pay | Admitting: Podiatry

## 2021-06-22 ENCOUNTER — Ambulatory Visit (INDEPENDENT_AMBULATORY_CARE_PROVIDER_SITE_OTHER): Payer: Medicare Other | Admitting: Podiatry

## 2021-06-22 DIAGNOSIS — Q828 Other specified congenital malformations of skin: Secondary | ICD-10-CM

## 2021-06-22 DIAGNOSIS — L608 Other nail disorders: Secondary | ICD-10-CM

## 2021-06-22 DIAGNOSIS — M79674 Pain in right toe(s): Secondary | ICD-10-CM

## 2021-06-22 DIAGNOSIS — B351 Tinea unguium: Secondary | ICD-10-CM

## 2021-06-22 DIAGNOSIS — M79675 Pain in left toe(s): Secondary | ICD-10-CM | POA: Diagnosis not present

## 2021-06-22 DIAGNOSIS — M2031 Hallux varus (acquired), right foot: Secondary | ICD-10-CM

## 2021-06-22 NOTE — Progress Notes (Signed)
This patient returns to the office for evaluation and treatment of long thick painful nails .  This patient is unable to trim his own nails since the patient cannot reach his feet.  Patient says the nails are painful walking and wearing his shoes.   She has painful callus on the bottom of her right big toe. She returns for preventive foot care services. ? ?General Appearance  Alert, conversant and in no acute stress. ? ?Vascular  Dorsalis pedis and posterior tibial  pulses are palpable  bilaterally.  Capillary return is within normal limits  bilaterally. Temperature is within normal limits  bilaterally. ? ?Neurologic  Senn-Weinstein monofilament wire test within normal limits  bilaterally. Muscle power within normal limits bilaterally. ? ?Nails Thick disfigured discolored nails with subungual debris  from hallux to fifth toes bilaterally. No evidence of bacterial infection or drainage bilaterally. Pincer fourth toenails  B/l. ? ?Orthopedic  No limitations of motion  feet .  No crepitus or effusions noted.  No bony pathology or digital deformities noted. 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 ? ?Skin  normotropic  bilaterally.  No signs of infections or ulcers noted.     Clavi 3rd toe left foot. symptomatic. Asymptomatic callus right hallux ? ?Onychomycosis  Pain in toes right foot  Pain in toes left foot  Callus right foot. ? ?Debridement  of nails  1-5  B/L with a nail nipper.  Nails were then filed using a dremel tool with no incidents.      RTC 9 weeks   ? ? ?Gardiner Barefoot DPM  ?

## 2021-06-29 DIAGNOSIS — M1009 Idiopathic gout, multiple sites: Secondary | ICD-10-CM | POA: Diagnosis not present

## 2021-06-29 DIAGNOSIS — M1991 Primary osteoarthritis, unspecified site: Secondary | ICD-10-CM | POA: Diagnosis not present

## 2021-06-29 DIAGNOSIS — R768 Other specified abnormal immunological findings in serum: Secondary | ICD-10-CM | POA: Diagnosis not present

## 2021-06-29 DIAGNOSIS — E663 Overweight: Secondary | ICD-10-CM | POA: Diagnosis not present

## 2021-06-29 DIAGNOSIS — Z6825 Body mass index (BMI) 25.0-25.9, adult: Secondary | ICD-10-CM | POA: Diagnosis not present

## 2021-06-29 DIAGNOSIS — L409 Psoriasis, unspecified: Secondary | ICD-10-CM | POA: Diagnosis not present

## 2021-07-02 ENCOUNTER — Telehealth: Payer: Self-pay | Admitting: Hematology

## 2021-07-02 NOTE — Telephone Encounter (Signed)
Per provider reschedule called and spoke to pt about appointment change  pt confirmed appointment  ?

## 2021-07-06 ENCOUNTER — Other Ambulatory Visit: Payer: Self-pay | Admitting: Family Medicine

## 2021-07-06 DIAGNOSIS — Z1231 Encounter for screening mammogram for malignant neoplasm of breast: Secondary | ICD-10-CM

## 2021-07-06 DIAGNOSIS — M9902 Segmental and somatic dysfunction of thoracic region: Secondary | ICD-10-CM | POA: Diagnosis not present

## 2021-07-06 DIAGNOSIS — M4726 Other spondylosis with radiculopathy, lumbar region: Secondary | ICD-10-CM | POA: Diagnosis not present

## 2021-07-06 DIAGNOSIS — M9903 Segmental and somatic dysfunction of lumbar region: Secondary | ICD-10-CM | POA: Diagnosis not present

## 2021-07-06 DIAGNOSIS — M9901 Segmental and somatic dysfunction of cervical region: Secondary | ICD-10-CM | POA: Diagnosis not present

## 2021-07-08 DIAGNOSIS — Z20822 Contact with and (suspected) exposure to covid-19: Secondary | ICD-10-CM | POA: Diagnosis not present

## 2021-07-15 ENCOUNTER — Ambulatory Visit (INDEPENDENT_AMBULATORY_CARE_PROVIDER_SITE_OTHER): Payer: Medicare Other | Admitting: Family Medicine

## 2021-07-15 ENCOUNTER — Encounter: Payer: Self-pay | Admitting: Family Medicine

## 2021-07-15 ENCOUNTER — Other Ambulatory Visit: Payer: Self-pay | Admitting: Family Medicine

## 2021-07-15 ENCOUNTER — Telehealth: Payer: Self-pay | Admitting: *Deleted

## 2021-07-15 VITALS — BP 140/82 | HR 66 | Temp 96.6°F

## 2021-07-15 DIAGNOSIS — N1831 Chronic kidney disease, stage 3a: Secondary | ICD-10-CM | POA: Diagnosis not present

## 2021-07-15 DIAGNOSIS — R5383 Other fatigue: Secondary | ICD-10-CM

## 2021-07-15 DIAGNOSIS — I10 Essential (primary) hypertension: Secondary | ICD-10-CM | POA: Diagnosis not present

## 2021-07-15 DIAGNOSIS — R5381 Other malaise: Secondary | ICD-10-CM | POA: Diagnosis not present

## 2021-07-15 NOTE — Progress Notes (Signed)
? ? ?Provider:  ?Alain Honey, MD ? ?Careteam: ?Patient Care Team: ?Wardell Honour, MD as PCP - General (Family Medicine) ?Belva Crome, MD as PCP - Cardiology (Cardiology) ? ?PLACE OF SERVICE:  ?[facility]  ?Advanced Directive information ?  ? ?Allergies  ?Allergen Reactions  ? Cephalexin Other (See Comments)  ?  Pt states this causes eye problems and STROKE-LIKE SYMPTOMS!! ?Other reaction(s): stroke symptoms  ? Shellfish Allergy Hives and Rash  ? Shellfish-Derived Products Hives and Rash  ?  Other reaction(s): hives  ? Clindamycin/Lincomycin Rash  ? Lincomycin Rash  ? Other   ?  Other reaction(s): rash  ? Penicillamine   ? Sugar-Protein-Starch Other (See Comments)  ?  Sugary foods cause legs to cramp  ? Tape Other (See Comments)  ?  Please use paper tape or Coban Wrap; patient's skin tears VERY easily!!  ? Ciprofloxacin Rash  ?  Other reaction(s): rash  ? Sulfa Antibiotics Hives and Rash  ? Sulfites Hives and Rash  ?  Other reaction(s): swelling  ? ? ?Chief Complaint  ?Patient presents with  ? Acute Visit  ?  Patient presents today for fatigue for a few weeks.  ? ? ? ?HPI: Patient is a 88 y.o. female complains today of lack of energy she has had an ongoing problem with anemia and is followed by hematology.  Her hemoglobin on most recent exam was 10.7 this is up from 8 range.  Also had potassium checked 3 months ago 5.1.  She does have a history of hyponatremia and takes salt tablets.  She remains active.  She lives in independent care at [facility] and participates in exercise group classes.  She does have a history of stage IIIb chronic kidney disease as well as inflammatory polyarthropathy. ?She does realize that her age may have some bearing in her energy levels and is excepting of that fact. ?There is no shortness of breath or chest pain ?Review of Systems:  ?Review of Systems  ?Constitutional:  Positive for malaise/fatigue.  ?Respiratory: Negative.    ?Cardiovascular: Negative.   ?Musculoskeletal:   Positive for joint pain.  ?     Has had left hip replaced and walks with a cane  ?Neurological: Negative.   ?Psychiatric/Behavioral: Negative.    ?All other systems reviewed and are negative. ? ?Past Medical History:  ?Diagnosis Date  ? Anemia   ? CHRONIC  ? Aortic insufficiency   ? a. 2D echo 08/29/15: EF 55-60%, mild LVH, diastolic dysfunction, elevated LV filling pressure, mild AI, severe LAE, mild RAE, mild TR, dilated descending thoracic aorta just distal to the takeoff of the left subclavian, measuring 4.1 cm, no pericardial effusion.  ? Arthritis   ? OA-SOME BACK AND NECK PAIN,  GOES TO CHIROPRACTOR TWICE A MONTH;  HX OF JOINT REPLACEMENTS   ? Cancer [facility])   ? SKIN CANCERS REMOVED FROM LEGS  ? CKD (chronic kidney disease), stage II   ? Depression   ? Essential hypertension   ? Gait abnormality 09/08/2017  ? GERD (gastroesophageal reflux disease)   ? Low sodium levels   ? Myocarditis ([place])   ? PSVT (paroxysmal supraventricular tachycardia) ([place])   ? Recurrent Pericarditis   ? RECURRENT   ? Thoracic aneurysm   ? a. Followed by [facility] -last seen in 12/2013 w/ plan for f/u MRA and thoracic surgery f/u in 2 yrs, but patient does not wish to follow any longer.  ? ?Past Surgical History:  ?Procedure Laterality Date  ?  APPENDECTOMY    ? BREAST EXCISIONAL BIOPSY Left 1992  ? benign  ? EXCISION/RELEASE BURSA HIP Left 11/24/2012  ? Procedure: LEFT HIP BURSECTOMY AND TENDON REPAIR ;  Surgeon: Gearlean Alf, MD;  Location: [facility];  Service: Orthopedics;  Laterality: Left;  ? HIP CLOSED REDUCTION Left 12/19/2012  ? Procedure: CLOSED MANIPULATION HIP;  Surgeon: Mauri Pole, MD;  Location: [facility];  Service: Orthopedics;  Laterality: Left;  ? HIP CLOSED REDUCTION Left 12/05/2013  ? Procedure: CLOSED MANIPULATION HIP;  Surgeon: Augustin Schooling, MD;  Location: [facility];  Service: Orthopedics;  Laterality: Left;  ? HIP CLOSED REDUCTION Left 09/22/2015  ? Procedure: CLOSED REDUCTION HIP;  Surgeon: Melina Schools, MD;  Location: [facility]  [facility];  Service: Orthopedics;  Laterality: Left;  ? JOINT REPLACEMENT    ? LEFT TOTAL HIP REPLACEMENT AND REVISIONS X 2  ? OOPHORECTOMY    ? has a partial of one ovary remaingin  ? PELVIC LAPAROSCOPY    ? ovarian cyst removal,   ? REVISION TOTAL HIP ARTHROPLASTY    ? left  ? right Achilles Tendon repair  5/13  ? TOTAL KNEE ARTHROPLASTY    ? right  ? TOTAL KNEE ARTHROPLASTY    ? left  ? VAGINAL HYSTERECTOMY    ? with ovarian cyst removal  ? ?Social History: ?  reports that she quit smoking about 58 years ago. Her smoking use included cigarettes. She has never used smokeless tobacco. She reports that she does not currently use drugs. She reports that she does not drink alcohol. ? ?Family History  ?Problem Relation Age of Onset  ? Hypertension Mother   ? Stroke Mother   ? Heart attack Father 79  ? Heart failure Father   ? Heart failure Sister   ? Aortic aneurysm Sister   ? Aortic aneurysm Sister   ? Stroke Sister   ? Pneumonia Brother   ? Stroke Brother   ? ALS Other   ? Hyperlipidemia Daughter   ? Aortic aneurysm Son   ? Breast cancer Neg Hx   ? ? ?Medications: ?Patient's Medications  ?New Prescriptions  ? No medications on file  ?Previous Medications  ? ACETAMINOPHEN (ACETAMINOPHEN EXTRA STRENGTH) 500 MG CAPSULE    1 capsule as needed  ? ALLOPURINOL (ZYLOPRIM) 100 MG TABLET    300 mg daily.  ? AMOXICILLIN (AMOXIL) 500 MG CAPSULE    Take 2,000 mg by mouth See admin instructions. 1 hour prior to dental appointment  ? ASPIRIN 81 MG TABLET    Take 81 mg by mouth every evening.  ? B COMPLEX VITAMINS (VITAMIN B COMPLEX) TABS    1 tablet  ? BETAMETHASONE DIPROPIONATE 0.05 % CREAM    Apply topically 2 (two) times daily. Applied to the right ear canal twice daily for 4 to 5 days or until itching resolves.  ? CALCIUM CARBONATE-VIT D-MIN (CALCIUM 1200 PO)    Take 1 tablet by mouth every evening.  ? CARBOXYMETHYLCELLULOSE (REFRESH PLUS) 0.5 % SOLN    Place 1 drop into both eyes in the morning and at bedtime.  ? CARVEDILOL (COREG)  12.5 MG TABLET    Take 1 tablet (12.5 mg total) by mouth 2 (two) times daily with a meal. 1/2 in the Am; 1 in the PM  ? CHOLECALCIFEROL (VITAMIN D) 1000 UNITS TABLET    Take 1,000 Units by mouth daily. D3  ? CLOBETASOL CREAM (TEMOVATE) 0.05 %    Apply 1 application topically 2 (two) times  daily as needed (itching).   ? COLCHICINE 0.6 MG CAPS    1 capsule  ? DILTIAZEM (CARDIZEM CD) 240 MG 24 HR CAPSULE    Take 1 capsule (240 mg total) by mouth daily.  ? ESTRADIOL (VIVELLE-DOT) 0.025 MG/24HR    Place 1 patch onto the skin 2 (two) times a week.  ? FERROUS SULFATE 325 (65 FE) MG TABLET    Take 325 mg by mouth daily.  ? MELOXICAM (MOBIC) 7.5 MG TABLET    Take 7.5 mg by mouth daily as needed for pain.   ? MUPIROCIN OINTMENT (BACTROBAN) 2 %    Apply 1 application topically See admin instructions. Apply to leg after showers  ? OMEGA-3 ACID ETHYL ESTERS (LOVAZA) 1 G CAPSULE    Take 1 g by mouth daily.   ? SODIUM CHLORIDE 1 G TABLET    Take 1 g by mouth daily.  ? TRETINOIN (RETIN-A) 0.1 % CREAM    Apply 1 application topically at bedtime as needed (breakouts).  ?Modified Medications  ? No medications on file  ?Discontinued Medications  ? BUDESONIDE (ENTOCORT EC) 3 MG 24 HR CAPSULE    Take 3 mg by mouth daily.  ? DILTIAZEM (DILACOR XR) 240 MG 24 HR CAPSULE    1 capsule  ? SODIUM CHLORIDE 1 G TABLET    1 tablet  ? ? ?Physical Exam: ? ?Vitals:  ? 07/15/21 0919  ?BP: 140/82  ?Pulse: 66  ?Temp: (!) 96.6 ?F (35.9 ?C)  ?SpO2: 97%  ? ?There is no height or weight on file to calculate BMI. ?Wt Readings from Last 3 Encounters:  ?06/10/21 149 lb 3.2 oz (67.7 kg)  ?03/11/21 153 lb (69.4 kg)  ?02/24/21 151 lb 6.4 oz (68.7 kg)  ? ? ?Physical Exam ?Vitals and nursing note reviewed.  ?Constitutional:   ?   Appearance: Normal appearance.  ?Cardiovascular:  ?   Rate and Rhythm: Normal rate and regular rhythm.  ?Pulmonary:  ?   Effort: Pulmonary effort is normal.  ?   Breath sounds: Normal breath sounds.  ?Neurological:  ?   General: No focal  deficit present.  ?   Mental Status: She is alert and oriented to person, place, and time.  ? ? ?Labs reviewed: ?Basic Metabolic Panel: ?Recent Labs  ?  01/16/21 ?1748 01/18/21 ?7048 01/26/21 ?2031 12/06/

## 2021-07-15 NOTE — Telephone Encounter (Signed)
[name] with [facility] [phone] called and stated that patient is requesting PT.  ?Requesting verbal order for PT Evaluation.  ? ?Is this ok.  ?Please Advise.  ?

## 2021-07-15 NOTE — Progress Notes (Signed)
[place] for PT through Home health ?

## 2021-07-16 DIAGNOSIS — R2681 Unsteadiness on feet: Secondary | ICD-10-CM | POA: Diagnosis not present

## 2021-07-16 DIAGNOSIS — R260 Ataxic gait: Secondary | ICD-10-CM | POA: Diagnosis not present

## 2021-07-16 DIAGNOSIS — M6281 Muscle weakness (generalized): Secondary | ICD-10-CM | POA: Diagnosis not present

## 2021-07-16 LAB — BASIC METABOLIC PANEL WITH GFR
BUN/Creatinine Ratio: 28 (calc) — ABNORMAL HIGH (ref 6–22)
BUN: 29 mg/dL — ABNORMAL HIGH (ref 7–25)
CO2: 24 mmol/L (ref 20–32)
Calcium: 9.4 mg/dL (ref 8.6–10.4)
Chloride: 103 mmol/L (ref 98–110)
Creat: 1.05 mg/dL — ABNORMAL HIGH (ref 0.60–0.95)
Glucose, Bld: 89 mg/dL (ref 65–139)
Potassium: 5 mmol/L (ref 3.5–5.3)
Sodium: 136 mmol/L (ref 135–146)
eGFR: 51 mL/min/{1.73_m2} — ABNORMAL LOW (ref >=60)

## 2021-07-20 DIAGNOSIS — M9901 Segmental and somatic dysfunction of cervical region: Secondary | ICD-10-CM | POA: Diagnosis not present

## 2021-07-20 DIAGNOSIS — M9902 Segmental and somatic dysfunction of thoracic region: Secondary | ICD-10-CM | POA: Diagnosis not present

## 2021-07-20 DIAGNOSIS — Z20822 Contact with and (suspected) exposure to covid-19: Secondary | ICD-10-CM | POA: Diagnosis not present

## 2021-07-20 DIAGNOSIS — M4726 Other spondylosis with radiculopathy, lumbar region: Secondary | ICD-10-CM | POA: Diagnosis not present

## 2021-07-20 DIAGNOSIS — M9903 Segmental and somatic dysfunction of lumbar region: Secondary | ICD-10-CM | POA: Diagnosis not present

## 2021-07-22 DIAGNOSIS — R2681 Unsteadiness on feet: Secondary | ICD-10-CM | POA: Diagnosis not present

## 2021-07-22 DIAGNOSIS — R260 Ataxic gait: Secondary | ICD-10-CM | POA: Diagnosis not present

## 2021-07-22 DIAGNOSIS — M6281 Muscle weakness (generalized): Secondary | ICD-10-CM | POA: Diagnosis not present

## 2021-07-27 DIAGNOSIS — M4726 Other spondylosis with radiculopathy, lumbar region: Secondary | ICD-10-CM | POA: Diagnosis not present

## 2021-07-27 DIAGNOSIS — M9901 Segmental and somatic dysfunction of cervical region: Secondary | ICD-10-CM | POA: Diagnosis not present

## 2021-07-27 DIAGNOSIS — M9902 Segmental and somatic dysfunction of thoracic region: Secondary | ICD-10-CM | POA: Diagnosis not present

## 2021-07-27 DIAGNOSIS — M9903 Segmental and somatic dysfunction of lumbar region: Secondary | ICD-10-CM | POA: Diagnosis not present

## 2021-07-28 DIAGNOSIS — M6281 Muscle weakness (generalized): Secondary | ICD-10-CM | POA: Diagnosis not present

## 2021-07-28 DIAGNOSIS — R260 Ataxic gait: Secondary | ICD-10-CM | POA: Diagnosis not present

## 2021-07-28 DIAGNOSIS — R2681 Unsteadiness on feet: Secondary | ICD-10-CM | POA: Diagnosis not present

## 2021-07-29 DIAGNOSIS — M1009 Idiopathic gout, multiple sites: Secondary | ICD-10-CM | POA: Diagnosis not present

## 2021-07-29 DIAGNOSIS — Z79899 Other long term (current) drug therapy: Secondary | ICD-10-CM | POA: Diagnosis not present

## 2021-07-30 DIAGNOSIS — Z961 Presence of intraocular lens: Secondary | ICD-10-CM | POA: Diagnosis not present

## 2021-07-30 DIAGNOSIS — H35363 Drusen (degenerative) of macula, bilateral: Secondary | ICD-10-CM | POA: Diagnosis not present

## 2021-07-30 DIAGNOSIS — H43813 Vitreous degeneration, bilateral: Secondary | ICD-10-CM | POA: Diagnosis not present

## 2021-07-30 DIAGNOSIS — H353132 Nonexudative age-related macular degeneration, bilateral, intermediate dry stage: Secondary | ICD-10-CM | POA: Diagnosis not present

## 2021-07-30 DIAGNOSIS — H26491 Other secondary cataract, right eye: Secondary | ICD-10-CM | POA: Diagnosis not present

## 2021-08-03 DIAGNOSIS — M4726 Other spondylosis with radiculopathy, lumbar region: Secondary | ICD-10-CM | POA: Diagnosis not present

## 2021-08-03 DIAGNOSIS — M9903 Segmental and somatic dysfunction of lumbar region: Secondary | ICD-10-CM | POA: Diagnosis not present

## 2021-08-03 DIAGNOSIS — M9902 Segmental and somatic dysfunction of thoracic region: Secondary | ICD-10-CM | POA: Diagnosis not present

## 2021-08-03 DIAGNOSIS — M9901 Segmental and somatic dysfunction of cervical region: Secondary | ICD-10-CM | POA: Diagnosis not present

## 2021-08-04 ENCOUNTER — Other Ambulatory Visit: Payer: Self-pay

## 2021-08-04 DIAGNOSIS — R260 Ataxic gait: Secondary | ICD-10-CM | POA: Diagnosis not present

## 2021-08-04 DIAGNOSIS — R2681 Unsteadiness on feet: Secondary | ICD-10-CM | POA: Diagnosis not present

## 2021-08-04 DIAGNOSIS — M6281 Muscle weakness (generalized): Secondary | ICD-10-CM | POA: Diagnosis not present

## 2021-08-04 DIAGNOSIS — D508 Other iron deficiency anemias: Secondary | ICD-10-CM

## 2021-08-05 ENCOUNTER — Ambulatory Visit: Payer: Medicare Other | Admitting: Hematology

## 2021-08-05 ENCOUNTER — Other Ambulatory Visit: Payer: Self-pay

## 2021-08-05 ENCOUNTER — Inpatient Hospital Stay: Payer: Medicare Other | Attending: Hematology

## 2021-08-05 ENCOUNTER — Other Ambulatory Visit: Payer: Medicare Other

## 2021-08-05 ENCOUNTER — Inpatient Hospital Stay (HOSPITAL_BASED_OUTPATIENT_CLINIC_OR_DEPARTMENT_OTHER): Payer: Medicare Other | Admitting: Hematology

## 2021-08-05 VITALS — BP 178/78 | HR 70 | Temp 97.3°F | Resp 18 | Wt 150.5 lb

## 2021-08-05 DIAGNOSIS — Z7982 Long term (current) use of aspirin: Secondary | ICD-10-CM | POA: Insufficient documentation

## 2021-08-05 DIAGNOSIS — Z79899 Other long term (current) drug therapy: Secondary | ICD-10-CM | POA: Insufficient documentation

## 2021-08-05 DIAGNOSIS — D649 Anemia, unspecified: Secondary | ICD-10-CM | POA: Diagnosis not present

## 2021-08-05 DIAGNOSIS — M109 Gout, unspecified: Secondary | ICD-10-CM | POA: Insufficient documentation

## 2021-08-05 DIAGNOSIS — E538 Deficiency of other specified B group vitamins: Secondary | ICD-10-CM

## 2021-08-05 DIAGNOSIS — Z87891 Personal history of nicotine dependence: Secondary | ICD-10-CM | POA: Insufficient documentation

## 2021-08-05 DIAGNOSIS — R21 Rash and other nonspecific skin eruption: Secondary | ICD-10-CM | POA: Diagnosis not present

## 2021-08-05 DIAGNOSIS — D508 Other iron deficiency anemias: Secondary | ICD-10-CM

## 2021-08-05 LAB — CMP (CANCER CENTER ONLY)
ALT: 19 U/L (ref 0–44)
AST: 28 U/L (ref 15–41)
Albumin: 4 g/dL (ref 3.5–5.0)
Alkaline Phosphatase: 82 U/L (ref 38–126)
Anion gap: 6 (ref 5–15)
BUN: 33 mg/dL — ABNORMAL HIGH (ref 8–23)
CO2: 27 mmol/L (ref 22–32)
Calcium: 9.3 mg/dL (ref 8.9–10.3)
Chloride: 102 mmol/L (ref 98–111)
Creatinine: 1.1 mg/dL — ABNORMAL HIGH (ref 0.44–1.00)
GFR, Estimated: 48 mL/min — ABNORMAL LOW (ref >60)
Glucose, Bld: 100 mg/dL — ABNORMAL HIGH (ref 70–99)
Potassium: 4.7 mmol/L (ref 3.5–5.1)
Sodium: 135 mmol/L (ref 135–145)
Total Bilirubin: 0.5 mg/dL (ref 0.3–1.2)
Total Protein: 7.7 g/dL (ref 6.5–8.1)

## 2021-08-05 LAB — CBC WITH DIFFERENTIAL/PLATELET
Abs Immature Granulocytes: 0.04 10*3/uL (ref 0.00–0.07)
Basophils Absolute: 0.2 10*3/uL — ABNORMAL HIGH (ref 0.0–0.1)
Basophils Relative: 2 %
Eosinophils Absolute: 1 10*3/uL — ABNORMAL HIGH (ref 0.0–0.5)
Eosinophils Relative: 11 %
HCT: 29.8 % — ABNORMAL LOW (ref 36.0–46.0)
Hemoglobin: 9.7 g/dL — ABNORMAL LOW (ref 12.0–15.0)
Immature Granulocytes: 0 %
Lymphocytes Relative: 25 %
Lymphs Abs: 2.3 10*3/uL (ref 0.7–4.0)
MCH: 29.7 pg (ref 26.0–34.0)
MCHC: 32.6 g/dL (ref 30.0–36.0)
MCV: 91.1 fL (ref 80.0–100.0)
Monocytes Absolute: 1.1 10*3/uL — ABNORMAL HIGH (ref 0.1–1.0)
Monocytes Relative: 12 %
Neutro Abs: 4.6 10*3/uL (ref 1.7–7.7)
Neutrophils Relative %: 50 %
Platelets: 238 10*3/uL (ref 150–400)
RBC: 3.27 MIL/uL — ABNORMAL LOW (ref 3.87–5.11)
RDW: 15.6 % — ABNORMAL HIGH (ref 11.5–15.5)
WBC: 9.2 10*3/uL (ref 4.0–10.5)
nRBC: 0 % (ref 0.0–0.2)

## 2021-08-05 LAB — IRON AND IRON BINDING CAPACITY (CC-WL,HP ONLY)
Iron: 73 ug/dL (ref 28–170)
Saturation Ratios: 24 % (ref 10.4–31.8)
TIBC: 300 ug/dL (ref 250–450)
UIBC: 227 ug/dL (ref 148–442)

## 2021-08-05 LAB — VITAMIN B12: Vitamin B-12: 822 pg/mL (ref 180–914)

## 2021-08-05 LAB — FERRITIN: Ferritin: 361 ng/mL — ABNORMAL HIGH (ref 11–307)

## 2021-08-05 NOTE — Progress Notes (Signed)
HEMATOLOGY/ONCOLOGY CLINIC NOTE  Date of Service: 08/05/2021   Patient Care Team: Wardell Honour, MD as PCP - General (Family Medicine) Belva Crome, MD as PCP - Cardiology (Cardiology)  CHIEF COMPLAINTS/PURPOSE OF CONSULTATION:  Continued management of normocytic anemia.  HISTORY OF PRESENTING ILLNESS:  Please see previous notes for details on initial presentation  INTERVAL HISTORY: [name] is a 88 y.o. female here for follow-up of her normocytic anemia. Patient was last seen in clinic by [place] on 02/17/2021. She reports She is doing well with no new symptoms or concerns.  She reports that she has less energy recently.  She notes she has gout now.   She reports swelling in left leg.   She notes some itching and a rash on left anterior arm. She further notes some itching and redness in left leg. We discussed that this could be associated with the Allopurinol or the Colchicine. She reports that Sarna does not help.  No dizziness or lightheadedness. No abnormal bleeding issues. No chest pain or SOB. No other new or acute focal symptoms.  Labs today were reviewed in detail with the patient. CBC stable RDW elevated at 15.6%, Hgb decreased at 9.7, WBC count of 9.2k, and platelets of 238k. CMP stable elevated creatinine of 1.10 and decreased GFR est of 48 consistent with chronic kidney disease. Ferritin at 361 Iron sat ratio at 24%. B12 at 822  MEDICAL HISTORY:  Past Medical History:  Diagnosis Date   Anemia    CHRONIC   Aortic insufficiency    a. 2D echo 08/29/15: EF 55-60%, mild LVH, diastolic dysfunction, elevated LV filling pressure, mild AI, severe LAE, mild RAE, mild TR, dilated descending thoracic aorta just distal to the takeoff of the left subclavian, measuring 4.1 cm, no pericardial effusion.   Arthritis    OA-SOME BACK AND NECK PAIN,  GOES TO CHIROPRACTOR TWICE A MONTH;  HX OF JOINT REPLACEMENTS    Cancer ([place])    SKIN CANCERS REMOVED FROM LEGS    CKD (chronic kidney disease), stage II    Depression    Essential hypertension    Gait abnormality 09/08/2017   GERD (gastroesophageal reflux disease)    Low sodium levels    Myocarditis ([facility])    PSVT (paroxysmal supraventricular tachycardia) ([facility])    Recurrent Pericarditis    RECURRENT    Thoracic aneurysm    a. Followed by [facility] -last seen in 12/2013 w/ plan for f/u MRA and thoracic surgery f/u in 2 yrs, but patient does not wish to follow any longer.    SURGICAL HISTORY: Past Surgical History:  Procedure Laterality Date   APPENDECTOMY     BREAST EXCISIONAL BIOPSY Left 1992   benign   EXCISION/RELEASE BURSA HIP Left 11/24/2012   Procedure: LEFT HIP BURSECTOMY AND TENDON REPAIR ;  Surgeon: Gearlean Alf, MD;  [facility];  Service: Orthopedics;  Laterality: Left;   HIP CLOSED REDUCTION Left 12/19/2012   Procedure: CLOSED MANIPULATION HIP;  Surgeon: Mauri Pole, MD;  Location: [facility];  Service: Orthopedics;  Laterality: Left;   HIP CLOSED REDUCTION Left 12/05/2013   Procedure: CLOSED MANIPULATION HIP;  Surgeon: Augustin Schooling, MD;  Location[facility];  Service: Orthopedics;  Laterality: Left;   HIP CLOSED REDUCTION Left 09/22/2015   Procedure: CLOSED REDUCTION HIP;  Surgeon: Melina Schools, MD;  Location[facility];  Service: Orthopedics;  Laterality: Left;   JOINT REPLACEMENT     LEFT TOTAL HIP REPLACEMENT AND REVISIONS  X 2   OOPHORECTOMY     has a partial of one ovary remaingin   PELVIC LAPAROSCOPY     ovarian cyst removal,    REVISION TOTAL HIP ARTHROPLASTY     left   right Achilles Tendon repair  5/13   TOTAL KNEE ARTHROPLASTY     right   TOTAL KNEE ARTHROPLASTY     left   VAGINAL HYSTERECTOMY     with ovarian cyst removal    SOCIAL HISTORY: Social History   Socioeconomic History   Marital status: Widowed    Spouse name: Not on file   Number of children: Not on file   Years of education: Not on file   Highest education level: Not on file  Occupational History    Not on file  Tobacco Use   Smoking status: Former    Years: 5.00    Types: Cigarettes    Quit date: 46    Years since quitting: 58.4   Smokeless tobacco: Never   Tobacco comments:    quit age 52  Vaping Use   Vaping Use: Never used  Substance and Sexual Activity   Alcohol use: No    Alcohol/week: 0.0 standard drinks   Drug use: Not Currently   Sexual activity: Not Currently    Birth control/protection: Surgical    Comment: HYST-1st intercourse 88 yo-Fewer than 5 partners  Other Topics Concern   Not on file  Social History Narrative   Diet: very good      Caffeine: 1 1/2 cup coffee (usually decaf)      Married, if yes what year: Widow, 1954      Do you live in a house, apartment, assisted living, condo, trailer, ect: [place], independent living      Is it one or more stories: 2nd level. elevate      How many persons live in your home? 1      Pets: No      Highest level or education completed: 14      Current/Past profession: Actuary      Exercise:  Yes                Type and how often: aerobics twice daily         Living Will: Yes   DNR: Yes   POA/HPOA: Yes      Functional Status:   Do you have difficulty bathing or dressing yourself? No   Do you have difficulty preparing food or eating? No   Do you have difficulty managing your medications? No   Do you have difficulty managing your finances? No   Do you have difficulty affording your medications? No   Social Determinants of Radio broadcast assistant Strain: Not on file  Food Insecurity: Not on file  Transportation Needs: Not on file  Physical Activity: Not on file  Stress: Not on file  Social Connections: Not on file  Intimate Partner Violence: Not on file    FAMILY HISTORY: Family History  Problem Relation Age of Onset   Hypertension Mother    Stroke Mother    Heart attack Father 7   Heart failure Father    Heart failure Sister    Aortic aneurysm Sister     Aortic aneurysm Sister    Stroke Sister    Pneumonia Brother    Stroke Brother    ALS Other    Hyperlipidemia Daughter    Aortic aneurysm Son    Breast  cancer Neg Hx     ALLERGIES:  is allergic to cephalexin, shellfish allergy, shellfish-derived products, clindamycin/lincomycin, lincomycin, other, penicillamine, sugar-protein-starch, tape, ciprofloxacin, sulfa antibiotics, and sulfites.  MEDICATIONS:  Current Outpatient Medications  Medication Sig Dispense Refill   Acetaminophen (ACETAMINOPHEN EXTRA STRENGTH) 500 MG capsule 1 capsule as needed     allopurinol (ZYLOPRIM) 100 MG tablet 300 mg daily.     amoxicillin (AMOXIL) 500 MG capsule Take 2,000 mg by mouth See admin instructions. 1 hour prior to dental appointment     aspirin 81 MG tablet Take 81 mg by mouth every evening.     B Complex Vitamins (VITAMIN B COMPLEX) TABS 1 tablet     betamethasone dipropionate 0.05 % cream Apply topically 2 (two) times daily. Applied to the right ear canal twice daily for 4 to 5 days or until itching resolves. 30 g 0   Calcium Carbonate-Vit D-Min (CALCIUM 1200 PO) Take 1 tablet by mouth every evening.     carboxymethylcellulose (REFRESH PLUS) 0.5 % SOLN Place 1 drop into both eyes in the morning and at bedtime.     carvedilol (COREG) 12.5 MG tablet Take 1 tablet (12.5 mg total) by mouth 2 (two) times daily with a meal. 1/2 in the Am; 1 in the PM (Patient taking differently: Take by mouth. 6.25 mg in the morning. 12.5 mg in the evening) 180 tablet 1   cholecalciferol (VITAMIN D) 1000 UNITS tablet Take 1,000 Units by mouth daily. D3     clobetasol cream (TEMOVATE) 1.24 % Apply 1 application topically 2 (two) times daily as needed (itching).      Colchicine 0.6 MG CAPS 1 capsule     diltiazem (CARDIZEM CD) 240 MG 24 hr capsule Take 1 capsule (240 mg total) by mouth daily. 90 capsule 1   estradiol (VIVELLE-DOT) 0.025 MG/24HR Place 1 patch onto the skin 2 (two) times a week. 24 patch 3   ferrous sulfate  325 (65 FE) MG tablet Take 325 mg by mouth daily.     meloxicam (MOBIC) 7.5 MG tablet Take 7.5 mg by mouth daily as needed for pain.      mupirocin ointment (BACTROBAN) 2 % Apply 1 application topically See admin instructions. Apply to leg after showers     omega-3 acid ethyl esters (LOVAZA) 1 g capsule Take 1 g by mouth daily.      sodium chloride 1 g tablet Take 1 g by mouth daily.     tretinoin (RETIN-A) 0.1 % cream Apply 1 application topically at bedtime as needed (breakouts).     No current facility-administered medications for this visit.    REVIEW OF SYSTEMS:   .10 Point review of Systems was done is negative except as noted above.  PHYSICAL EXAMINATION: ECOG PERFORMANCE STATUS: 0 - Asymptomatic  .BP (!) 178/78   Pulse 70   Temp (!) 97.3 F (36.3 C)   Resp 18   Wt 150 lb 8 oz (68.3 kg)   SpO2 98%   BMI 26.87 kg/m   Exam was given in a chair.  NAD GENERAL:alert, in no acute distress and comfortable SKIN: no acute rashes, no significant lesions EYES: conjunctiva are pink and non-injected, sclera anicteric NECK: supple, no JVD LYMPH:  no palpable lymphadenopathy in the cervical, axillary or inguinal regions LUNGS: clear to auscultation b/l with normal respiratory effort HEART: regular rate & rhythm ABDOMEN:  normoactive bowel sounds , non tender, not distended. Extremity: no pedal edema PSYCH: alert & oriented x 3 with fluent  speech NEURO: no focal motor/sensory deficits  LABORATORY DATA:   I have reviewed the data as listed  .    Latest Ref Rng & Units 08/05/2021   10:39 AM 05/18/2021   10:47 AM 04/17/2021    1:31 PM  CBC  WBC 4.0 - 10.5 K/uL 9.2   8.9   9.1    Hemoglobin 12.0 - 15.0 g/dL 9.7   10.0   10.5    Hematocrit 36.0 - 46.0 % 29.8   31.7   32.7    Platelets 150 - 400 K/uL 238   251   239     . CBC    Component Value Date/Time   WBC 9.2 08/05/2021 1039   RBC 3.27 (L) 08/05/2021 1039   HGB 9.7 (L) 08/05/2021 1039   HGB 8.7 (L) 02/17/2021 1001    HGB 10.8 (L) 12/26/2007 1539   HCT 29.8 (L) 08/05/2021 1039   HCT 28.2 (L) 02/17/2021 1001   HCT 33.4 (L) 12/26/2007 1539   PLT 238 08/05/2021 1039   PLT 278 02/17/2021 1001   PLT 314 12/26/2007 1539   MCV 91.1 08/05/2021 1039   MCV 88.5 12/26/2007 1539   MCH 29.7 08/05/2021 1039   MCHC 32.6 08/05/2021 1039   RDW 15.6 (H) 08/05/2021 1039   RDW 15.3 (H) 12/26/2007 1539   LYMPHSABS 2.3 08/05/2021 1039   LYMPHSABS 2.2 12/26/2007 1539   MONOABS 1.1 (H) 08/05/2021 1039   MONOABS 0.8 12/26/2007 1539   EOSABS 1.0 (H) 08/05/2021 1039   EOSABS 0.6 (H) 12/26/2007 1539   BASOSABS 0.2 (H) 08/05/2021 1039   BASOSABS 0.1 12/26/2007 1539     .    Latest Ref Rng & Units 08/05/2021   10:39 AM 07/15/2021   10:04 AM 04/17/2021    1:31 PM  CMP  Glucose 70 - 99 mg/dL 100   89   99    BUN 8 - 23 mg/dL 33   29   31    Creatinine 0.44 - 1.00 mg/dL 1.10   1.05   1.08    Sodium 135 - 145 mmol/L 135   136   135    Potassium 3.5 - 5.1 mmol/L 4.7   5.0   5.1    Chloride 98 - 111 mmol/L 102   103   103    CO2 22 - 32 mmol/L '27   24   24    '$ Calcium 8.9 - 10.3 mg/dL 9.3   9.4   9.1    Total Protein 6.5 - 8.1 g/dL 7.7      Total Bilirubin 0.3 - 1.2 mg/dL 0.5      Alkaline Phos 38 - 126 U/L 82      AST 15 - 41 U/L 28      ALT 0 - 44 U/L 19       Component     Latest Ref Rng & Units 03/06/2020  Iron     41 - 142 ug/dL 35 (L)  TIBC     236 - 444 ug/dL 448 (H)  Saturation Ratios     21 - 57 % 8 (L)  UIBC     120 - 384 ug/dL 413 (H)  Folate, Hemolysate     Not Estab. ng/mL 605.0  HCT     34.0 - 46.6 % 29.6 (L)  Folate, RBC     >498 ng/mL 2,044  Erythropoietin     2.6 - 18.5 mIU/mL 14.9  RA Latex Turbid.     <  14.0 IU/mL 99.5 (H)  Vitamin B12     180 - 914 pg/mL 841  Ferritin     11 - 307 ng/mL 35  Sed Rate     0 - 22 mm/hr 22   . Lab Results  Component Value Date   IRON 73 08/05/2021   TIBC 300 08/05/2021   IRONPCTSAT 24 08/05/2021   (Iron and TIBC)  Lab Results  Component Value  Date   FERRITIN 361 (H) 08/05/2021     RADIOGRAPHIC STUDIES: I have personally reviewed the radiological images as listed and agreed with the findings in the report. No results found.  ASSESSMENT & PLAN:   88 yo with   1) Chronic normocytic anemia Due to CKD + iron deficiency + anemia of chronic inflammation from possible RA 2) RA -not currently on treatment  PLAN: -Labs today were reviewed in detail with the patient. CBC stable RDW elevated at 15.6%, Hgb decreased at 9.7, WBC count of 9.2k, and platelets of 238k. CMP stable elevated creatinine of 1.10 and decreased GFR est of 48 consistent with chronic kidney disease. Ferritin 361 with an iron saturation of 24% B12 -- 822 -Continue f/u w PCP regarding management of RA. -Patient's ferritin level is overestimated in the setting of inflammation from rheumatoid arthritis.  Iron saturation is less than 20%. -no indication for additional IV Iron at this time. -Optimization of rheumatoid arthritis treatment with primary care physician/rheumatology.  The patient still has anemia from chronic inflammation related to the rheumatoid arthritis might need to consider equal for hemoglobin less than 9 or if oxygen dependent. -Continue vitamin B complex 1 capsule p.o. daily -RTC in 6 months with labs  FOLLOW UP: RTC with Dr Irene Limbo with labs in 6 months  .The total time spent in the appointment was 20 minutes* .  All of the patient's questions were answered with apparent satisfaction. The patient knows to call the clinic with any problems, questions or concerns.   Sullivan Lone MD MS AAHIVMS [facility] Hematology/Oncology Physician [facility]  .*Total Encounter Time as defined by the Centers for Medicare and Medicaid Services includes, in addition to the face-to-face time of a patient visit (documented in the note above) non-face-to-face time: obtaining and reviewing outside history, ordering and reviewing medications, tests or  procedures, care coordination (communications with other health care professionals or caregivers) and documentation in the medical record.   I, Melene Muller, am acting as scribe for Dr. Sullivan Lone, MD. .I have reviewed the above documentation for accuracy and completeness, and I agree with the above. Brunetta Genera MD

## 2021-08-06 ENCOUNTER — Telehealth: Payer: Self-pay | Admitting: Hematology

## 2021-08-06 NOTE — Telephone Encounter (Signed)
Scheduled per 5/24 los, pt has been called and confirmed  

## 2021-08-11 ENCOUNTER — Encounter: Payer: Self-pay | Admitting: Hematology

## 2021-08-17 DIAGNOSIS — M4726 Other spondylosis with radiculopathy, lumbar region: Secondary | ICD-10-CM | POA: Diagnosis not present

## 2021-08-17 DIAGNOSIS — M9903 Segmental and somatic dysfunction of lumbar region: Secondary | ICD-10-CM | POA: Diagnosis not present

## 2021-08-17 DIAGNOSIS — M9902 Segmental and somatic dysfunction of thoracic region: Secondary | ICD-10-CM | POA: Diagnosis not present

## 2021-08-17 DIAGNOSIS — M9901 Segmental and somatic dysfunction of cervical region: Secondary | ICD-10-CM | POA: Diagnosis not present

## 2021-08-19 ENCOUNTER — Encounter: Payer: Self-pay | Admitting: Family Medicine

## 2021-08-19 ENCOUNTER — Other Ambulatory Visit: Payer: Medicare Other

## 2021-08-19 ENCOUNTER — Ambulatory Visit (INDEPENDENT_AMBULATORY_CARE_PROVIDER_SITE_OTHER): Payer: Medicare Other | Admitting: Family Medicine

## 2021-08-19 VITALS — BP 166/98 | HR 61 | Temp 96.6°F | Ht 62.75 in | Wt 150.8 lb

## 2021-08-19 DIAGNOSIS — D649 Anemia, unspecified: Secondary | ICD-10-CM | POA: Diagnosis not present

## 2021-08-19 DIAGNOSIS — I10 Essential (primary) hypertension: Secondary | ICD-10-CM

## 2021-08-19 DIAGNOSIS — R5382 Chronic fatigue, unspecified: Secondary | ICD-10-CM | POA: Diagnosis not present

## 2021-08-19 DIAGNOSIS — E790 Hyperuricemia without signs of inflammatory arthritis and tophaceous disease: Secondary | ICD-10-CM

## 2021-08-19 DIAGNOSIS — N1831 Chronic kidney disease, stage 3a: Secondary | ICD-10-CM

## 2021-08-19 NOTE — Progress Notes (Signed)
Provider:  Alain Honey, MD  Careteam: Patient Care Team: Wardell Honour, MD as PCP - General (Family Medicine) Belva Crome, MD as PCP - Cardiology (Cardiology)  PLACE OF SERVICE:  [facility]  Advanced Directive information    Allergies  Allergen Reactions   Cephalexin Other (See Comments)    Pt states this causes eye problems and STROKE-LIKE SYMPTOMS!! Other reaction(s): stroke symptoms   Shellfish Allergy Hives and Rash   Shellfish-Derived Products Hives and Rash    Other reaction(s): hives   Clindamycin/Lincomycin Rash   Lincomycin Rash   Other     Other reaction(s): rash   Penicillamine    Sugar-Protein-Starch Other (See Comments)    Sugary foods cause legs to cramp   Tape Other (See Comments)    Please use paper tape or Coban Wrap; patient's skin tears VERY easily!!   Ciprofloxacin Rash    Other reaction(s): rash   Sulfa Antibiotics Hives and Rash   Sulfites Hives and Rash    Other reaction(s): swelling    No chief complaint on file.    HPI: Patient is a 88 y.o. female patient is here today to discuss treatment of gout.  She is followed by rheumatologist who has her on prednisone.  There is also some question that allopurinol may be causing allergic reaction but curiously she continues on allopurinol in the face of acute flare of gout.  She has seen hematologist since her last visit here and hemoglobin is stable.  Previously she had talked about not having as much energy as previously noted.  She continues to be active, exercising regularly. Memory is pretty good she has some lapses in forgetfulness but for 87 years old does well Review of Systems:  Review of Systems  Constitutional:  Positive for malaise/fatigue.  Respiratory: Negative.    Cardiovascular: Negative.   Musculoskeletal:  Positive for joint pain.       Thought to be secondary to get  Skin:  Positive for itching and rash.  Psychiatric/Behavioral: Negative.    All other systems  reviewed and are negative.  Past Medical History:  Diagnosis Date   Anemia    CHRONIC   Aortic insufficiency    a. 2D echo 08/29/15: EF 55-60%, mild LVH, diastolic dysfunction, elevated LV filling pressure, mild AI, severe LAE, mild RAE, mild TR, dilated descending thoracic aorta just distal to the takeoff of the left subclavian, measuring 4.1 cm, no pericardial effusion.   Arthritis    OA-SOME BACK AND NECK PAIN,  GOES TO CHIROPRACTOR TWICE A MONTH;  HX OF JOINT REPLACEMENTS    Cancer ([place])    SKIN CANCERS REMOVED FROM LEGS   CKD (chronic kidney disease), stage II    Depression    Essential hypertension    Gait abnormality 09/08/2017   GERD (gastroesophageal reflux disease)    Low sodium levels    Myocarditis ([facility])    PSVT (paroxysmal supraventricular tachycardia) ([facility])    Recurrent Pericarditis    RECURRENT    Thoracic aneurysm    a. Followed by [facility] -last seen in 12/2013 w/ plan for f/u MRA and thoracic surgery f/u in 2 yrs, but patient does not wish to follow any longer.   Past Surgical History:  Procedure Laterality Date   APPENDECTOMY     BREAST EXCISIONAL BIOPSY Left 1992   benign   EXCISION/RELEASE BURSA HIP Left 11/24/2012   Procedure: LEFT HIP BURSECTOMY AND TENDON REPAIR ;  Surgeon: Gearlean Alf, MD;  Location:  [facility];  Service: Orthopedics;  Laterality: Left;   HIP CLOSED REDUCTION Left 12/19/2012   Procedure: CLOSED MANIPULATION HIP;  Surgeon: Mauri Pole, MD;  Location: [facility];  Service: Orthopedics;  Laterality: Left;   HIP CLOSED REDUCTION Left 12/05/2013   Procedure: CLOSED MANIPULATION HIP;  Surgeon: Augustin Schooling, MD;  Location: [facility];  Service: Orthopedics;  Laterality: Left;   HIP CLOSED REDUCTION Left 09/22/2015   Procedure: CLOSED REDUCTION HIP;  Surgeon: Melina Schools, MD;  Location: [facility];  Service: Orthopedics;  Laterality: Left;   JOINT REPLACEMENT     LEFT TOTAL HIP REPLACEMENT AND REVISIONS X 2   OOPHORECTOMY     has a partial of one ovary  remaingin   PELVIC LAPAROSCOPY     ovarian cyst removal,    REVISION TOTAL HIP ARTHROPLASTY     left   right Achilles Tendon repair  5/13   TOTAL KNEE ARTHROPLASTY     right   TOTAL KNEE ARTHROPLASTY     left   VAGINAL HYSTERECTOMY     with ovarian cyst removal   Social History:   reports that she quit smoking about 58 years ago. Her smoking use included cigarettes. She has never used smokeless tobacco. She reports that she does not currently use drugs. She reports that she does not drink alcohol.  Family History  Problem Relation Age of Onset   Hypertension Mother    Stroke Mother    Heart attack Father 34   Heart failure Father    Heart failure Sister    Aortic aneurysm Sister    Aortic aneurysm Sister    Stroke Sister    Pneumonia Brother    Stroke Brother    ALS Other    Hyperlipidemia Daughter    Aortic aneurysm Son    Breast cancer Neg Hx     Medications: Patient's Medications  New Prescriptions   No medications on file  Previous Medications   ACETAMINOPHEN (ACETAMINOPHEN EXTRA STRENGTH) 500 MG CAPSULE    1 capsule as needed   ALLOPURINOL (ZYLOPRIM) 100 MG TABLET    300 mg daily.   AMOXICILLIN (AMOXIL) 500 MG CAPSULE    Take 2,000 mg by mouth See admin instructions. 1 hour prior to dental appointment   ASPIRIN 81 MG TABLET    Take 81 mg by mouth every evening.   B COMPLEX VITAMINS (VITAMIN B COMPLEX) TABS    1 tablet   BETAMETHASONE DIPROPIONATE 0.05 % CREAM    Apply topically 2 (two) times daily. Applied to the right ear canal twice daily for 4 to 5 days or until itching resolves.   CALCIUM CARBONATE-VIT D-MIN (CALCIUM 1200 PO)    Take 1 tablet by mouth every evening.   CARBOXYMETHYLCELLULOSE (REFRESH PLUS) 0.5 % SOLN    Place 1 drop into both eyes in the morning and at bedtime.   CARVEDILOL (COREG) 12.5 MG TABLET    Take 1 tablet (12.5 mg total) by mouth 2 (two) times daily with a meal. 1/2 in the Am; 1 in the PM   CHOLECALCIFEROL (VITAMIN D) 1000 UNITS TABLET     Take 1,000 Units by mouth daily. D3   CLOBETASOL CREAM (TEMOVATE) 0.05 %    Apply 1 application topically 2 (two) times daily as needed (itching).    COLCHICINE 0.6 MG CAPS    1 capsule   DILTIAZEM (CARDIZEM CD) 240 MG 24 HR CAPSULE    Take 1 capsule (240 mg total) by mouth daily.  ESTRADIOL (VIVELLE-DOT) 0.025 MG/24HR    Place 1 patch onto the skin 2 (two) times a week.   FERROUS SULFATE 325 (65 FE) MG TABLET    Take 325 mg by mouth daily. 2 tablets daily   MELOXICAM (MOBIC) 7.5 MG TABLET    Take 7.5 mg by mouth daily as needed for pain.    MUPIROCIN OINTMENT (BACTROBAN) 2 %    Apply 1 application topically See admin instructions. Apply to leg after showers   OMEGA-3 ACID ETHYL ESTERS (LOVAZA) 1 G CAPSULE    Take 1 g by mouth daily.    SODIUM CHLORIDE 1 G TABLET    Take 1 g by mouth daily.   TRETINOIN (RETIN-A) 0.1 % CREAM    Apply 1 application topically at bedtime as needed (breakouts).  Modified Medications   No medications on file  Discontinued Medications   No medications on file    Physical Exam:  There were no vitals filed for this visit. There is no height or weight on file to calculate BMI. Wt Readings from Last 3 Encounters:  08/05/21 150 lb 8 oz (68.3 kg)  06/10/21 149 lb 3.2 oz (67.7 kg)  03/11/21 153 lb (69.4 kg)    Physical Exam Vitals and nursing note reviewed.  Constitutional:      Appearance: Normal appearance.  Cardiovascular:     Rate and Rhythm: Normal rate.  Pulmonary:     Effort: Pulmonary effort is normal.  Skin:    Comments: Complains of rash and itching but there is no rash present today  Neurological:     General: No focal deficit present.     Mental Status: She is alert and oriented to person, place, and time.    Labs reviewed: Basic Metabolic Panel: Recent Labs    01/16/21 1748 01/18/21 0928 04/17/21 1331 07/15/21 1004 08/05/21 1039  NA 126*   < > 135 136 135  K 4.6   < > 5.1 5.0 4.7  CL 96*   < > 103 103 102  CO2 23   < > '24 24 27   '$ GLUCOSE 115*   < > 99 89 100*  BUN 28*   < > 31* 29* 33*  CREATININE 1.07*   < > 1.08* 1.05* 1.10*  CALCIUM 8.2*   < > 9.1 9.4 9.3  TSH 2.213  --   --   --   --    < > = values in this interval not displayed.   Liver Function Tests: Recent Labs    01/16/21 1748 02/17/21 1001 08/05/21 1039  AST '28 24 28  '$ ALT '26 16 19  '$ ALKPHOS 49 55 82  BILITOT 0.8 0.4 0.5  PROT 6.9 7.5 7.7  ALBUMIN 3.4* 3.8 4.0   No results for input(s): LIPASE, AMYLASE in the last 8760 hours. No results for input(s): AMMONIA in the last 8760 hours. CBC: Recent Labs    02/17/21 1001 04/17/21 1331 05/18/21 1047 08/05/21 1039  WBC 8.6 9.1 8.9 9.2  NEUTROABS 4.8 4.9  --  4.6  HGB 8.7* 10.5* 10.0* 9.7*  HCT 27.4*  28.2* 32.7* 31.7* 29.8*  MCV 92.9 90.6 89.8 91.1  PLT 278 239 251 238   Lipid Panel: No results for input(s): CHOL, HDL, LDLCALC, TRIG, CHOLHDL, LDLDIRECT in the last 8760 hours. TSH: Recent Labs    01/16/21 1748  TSH 2.213   A1C: No results found for: HGBA1C   Assessment/Plan  1. Anemia, unspecified type Related to chronic disease and CKD  2. Stage 3a chronic kidney disease ([place]) Stable for now  3. Essential hypertension Blood pressure elevated today at 166/98.  She takes carvedilol as well as diltiazem  4. Chronic fatigue Multifactorial as with age, hemoglobin 1  5. Hyperuricemia Because of possible allergies seems to not want to take allopurinol.  Ask her to speak with her rheumatologist about other agents which lower uric acid   Alain Honey, MD [place] [phone]

## 2021-08-24 ENCOUNTER — Ambulatory Visit (INDEPENDENT_AMBULATORY_CARE_PROVIDER_SITE_OTHER): Payer: Medicare Other | Admitting: Podiatry

## 2021-08-24 ENCOUNTER — Encounter: Payer: Self-pay | Admitting: Podiatry

## 2021-08-24 DIAGNOSIS — M79674 Pain in right toe(s): Secondary | ICD-10-CM | POA: Diagnosis not present

## 2021-08-24 DIAGNOSIS — M79675 Pain in left toe(s): Secondary | ICD-10-CM | POA: Diagnosis not present

## 2021-08-24 DIAGNOSIS — B351 Tinea unguium: Secondary | ICD-10-CM | POA: Diagnosis not present

## 2021-08-24 DIAGNOSIS — M2031 Hallux varus (acquired), right foot: Secondary | ICD-10-CM

## 2021-08-24 DIAGNOSIS — Q828 Other specified congenital malformations of skin: Secondary | ICD-10-CM

## 2021-08-24 DIAGNOSIS — L608 Other nail disorders: Secondary | ICD-10-CM

## 2021-08-24 NOTE — Progress Notes (Signed)
This patient returns to the office for evaluation and treatment of long thick painful nails .  This patient is unable to trim his own nails since the patient cannot reach his feet.  Patient says the nails are painful walking and wearing his shoes.   She has painful callus on the bottom of her right big toe. She returns for preventive foot care services.  General Appearance  Alert, conversant and in no acute stress.  Vascular  Dorsalis pedis and posterior tibial  pulses are palpable  bilaterally.  Capillary return is within normal limits  bilaterally. Temperature is within normal limits  bilaterally.  Neurologic  Senn-Weinstein monofilament wire test within normal limits  bilaterally. Muscle power within normal limits bilaterally.  Nails Thick disfigured discolored nails with subungual debris  from hallux to fifth toes bilaterally. No evidence of bacterial infection or drainage bilaterally. Pincer fourth toenails  B/l.  Orthopedic  No limitations of motion  feet .  No crepitus or effusions noted.  No bony pathology or digital deformities noted. 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  Skin  normotropic  bilaterally.  No signs of infections or ulcers noted.     symptomatic callus right hallux  Onychomycosis  Pain in toes right foot  Pain in toes left foot  Callus right foot.  Debridement  of nails  1-5  B/L with a nail nipper.  Nails were then filed using a dremel tool with no incidents.      RTC 9 weeks     Maninder Deboer DPM  

## 2021-08-26 ENCOUNTER — Other Ambulatory Visit: Payer: Self-pay | Admitting: Interventional Cardiology

## 2021-08-26 ENCOUNTER — Ambulatory Visit: Payer: Medicare Other | Admitting: Family Medicine

## 2021-08-31 DIAGNOSIS — M9901 Segmental and somatic dysfunction of cervical region: Secondary | ICD-10-CM | POA: Diagnosis not present

## 2021-08-31 DIAGNOSIS — L282 Other prurigo: Secondary | ICD-10-CM | POA: Diagnosis not present

## 2021-08-31 DIAGNOSIS — M9903 Segmental and somatic dysfunction of lumbar region: Secondary | ICD-10-CM | POA: Diagnosis not present

## 2021-08-31 DIAGNOSIS — Z85828 Personal history of other malignant neoplasm of skin: Secondary | ICD-10-CM | POA: Diagnosis not present

## 2021-08-31 DIAGNOSIS — M9902 Segmental and somatic dysfunction of thoracic region: Secondary | ICD-10-CM | POA: Diagnosis not present

## 2021-08-31 DIAGNOSIS — C44729 Squamous cell carcinoma of skin of left lower limb, including hip: Secondary | ICD-10-CM | POA: Diagnosis not present

## 2021-08-31 DIAGNOSIS — D485 Neoplasm of uncertain behavior of skin: Secondary | ICD-10-CM | POA: Diagnosis not present

## 2021-08-31 DIAGNOSIS — L309 Dermatitis, unspecified: Secondary | ICD-10-CM | POA: Diagnosis not present

## 2021-08-31 DIAGNOSIS — M4726 Other spondylosis with radiculopathy, lumbar region: Secondary | ICD-10-CM | POA: Diagnosis not present

## 2021-09-01 ENCOUNTER — Emergency Department (HOSPITAL_BASED_OUTPATIENT_CLINIC_OR_DEPARTMENT_OTHER): Payer: Medicare Other

## 2021-09-01 ENCOUNTER — Emergency Department (HOSPITAL_BASED_OUTPATIENT_CLINIC_OR_DEPARTMENT_OTHER)
Admission: EM | Admit: 2021-09-01 | Discharge: 2021-09-02 | Disposition: A | Discharge status: Home / Self Care | Payer: Medicare Other | Attending: Emergency Medicine | Admitting: Emergency Medicine

## 2021-09-01 ENCOUNTER — Other Ambulatory Visit: Payer: Self-pay

## 2021-09-01 DIAGNOSIS — N189 Chronic kidney disease, unspecified: Secondary | ICD-10-CM | POA: Diagnosis not present

## 2021-09-01 DIAGNOSIS — W010XXA Fall on same level from slipping, tripping and stumbling without subsequent striking against object, initial encounter: Secondary | ICD-10-CM | POA: Insufficient documentation

## 2021-09-01 DIAGNOSIS — S8991XA Unspecified injury of right lower leg, initial encounter: Secondary | ICD-10-CM | POA: Diagnosis present

## 2021-09-01 DIAGNOSIS — S81011A Laceration without foreign body, right knee, initial encounter: Secondary | ICD-10-CM | POA: Diagnosis not present

## 2021-09-01 DIAGNOSIS — Z85828 Personal history of other malignant neoplasm of skin: Secondary | ICD-10-CM | POA: Diagnosis not present

## 2021-09-01 DIAGNOSIS — Z23 Encounter for immunization: Secondary | ICD-10-CM | POA: Insufficient documentation

## 2021-09-01 DIAGNOSIS — T148XXA Other injury of unspecified body region, initial encounter: Secondary | ICD-10-CM | POA: Diagnosis not present

## 2021-09-01 DIAGNOSIS — W19XXXA Unspecified fall, initial encounter: Secondary | ICD-10-CM | POA: Diagnosis not present

## 2021-09-01 DIAGNOSIS — Z7982 Long term (current) use of aspirin: Secondary | ICD-10-CM | POA: Insufficient documentation

## 2021-09-01 DIAGNOSIS — M25561 Pain in right knee: Secondary | ICD-10-CM | POA: Diagnosis not present

## 2021-09-01 DIAGNOSIS — R58 Hemorrhage, not elsewhere classified: Secondary | ICD-10-CM | POA: Diagnosis not present

## 2021-09-01 DIAGNOSIS — I129 Hypertensive chronic kidney disease with stage 1 through stage 4 chronic kidney disease, or unspecified chronic kidney disease: Secondary | ICD-10-CM | POA: Diagnosis not present

## 2021-09-01 DIAGNOSIS — Z96651 Presence of right artificial knee joint: Secondary | ICD-10-CM | POA: Diagnosis not present

## 2021-09-01 MED ORDER — TETANUS-DIPHTH-ACELL PERTUSSIS 5-2.5-18.5 LF-MCG/0.5 IM SUSY
0.5000 mL | PREFILLED_SYRINGE | Freq: Once | INTRAMUSCULAR | Status: AC
  Administered 2021-09-01: 0.5 mL via INTRAMUSCULAR
  Filled 2021-09-01: qty 0.5

## 2021-09-01 NOTE — Discharge Instructions (Signed)
Your history, exam and work-up today are consistent with multiple skin tears after falling and hitting her knee.  When they were dried and cleaned the wounds did not appear significantly deep and the x-ray did not show evidence of bony injury or evidence of knee joint violation.  We suspect they are skin tears that blood and then reopened with knee bending.  We updated your tetanus.  We cleaned and dressed your knee with gauze to prevent bleeding and please use the knee immobilizer to help prevent knee bending and reopening the wound.  Please rest and stay hydrated and follow-up with your primary doctor in the next several days for reassessment.  Watch for signs and symptoms of infection.  If any symptoms change or worsen acutely, please return to the nearest emergency department.

## 2021-09-01 NOTE — ED Triage Notes (Signed)
Pt BIB [facility] from [facility] [place] following a mechanical fall. She was ambulating with her walker tripping on the wheel and losing her footing. C/O right knee pain with 3 large visible lacerations. Mobility intact. No LOC. No other injury.

## 2021-09-01 NOTE — ED Provider Notes (Signed)
[facility] Provider Note   CSN: [id number] Arrival date & time: 09/01/21  2045     History {Add pertinent medical, surgical, social history, OB history to HPI:1} Chief Complaint  Patient presents with  . Fall    [name] is a 88 y.o. female.   Fall     Home Medications Prior to Admission medications   Medication Sig Start Date End Date Taking? Authorizing Provider  Acetaminophen (ACETAMINOPHEN EXTRA STRENGTH) 500 MG capsule 1 capsule as needed    [provider]  allopurinol (ZYLOPRIM) 100 MG tablet 300 mg daily. 04/08/21   [provider]  amoxicillin (AMOXIL) 500 MG capsule Take 2,000 mg by mouth See admin instructions. 1 hour prior to dental appointment 04/10/20   [provider]  aspirin 81 MG tablet Take 81 mg by mouth every evening.    [provider]  B Complex Vitamins (VITAMIN B COMPLEX) TABS 1 tablet    [provider]  betamethasone dipropionate 0.05 % cream Apply topically 2 (two) times daily. Applied to the right ear canal twice daily for 4 to 5 days or until itching resolves. 12/22/20   Rozetta Nunnery, MD  Calcium Carbonate-Vit D-Min (CALCIUM 1200 PO) Take 1 tablet by mouth every evening.    [provider]  carboxymethylcellulose (REFRESH PLUS) 0.5 % SOLN Place 1 drop into both eyes in the morning and at bedtime.    [provider]  carvedilol (COREG) 12.5 MG tablet Take 1 tablet (12.5 mg total) by mouth 2 (two) times daily with a meal. 1/2 in the Am; 1 in the PM Patient taking differently: Take by mouth. 6.25 mg in the morning. 12.5 mg in the evening 01/14/21   Belva Crome, MD  cholecalciferol (VITAMIN D) 1000 UNITS tablet Take 1,000 Units by mouth daily. D3    [provider]  clobetasol cream (TEMOVATE) 7.82 % Apply 1 application topically 2 (two) times daily as needed (itching).  08/08/13   [provider]  Colchicine 0.6 MG CAPS 1  capsule 04/08/21   [provider]  diltiazem (CARDIZEM CD) 240 MG 24 hr capsule TAKE 1 CAPSULE DAILY       *OCEANSIDE MFR* 08/27/21   Belva Crome, MD  estradiol (VIVELLE-DOT) 0.025 MG/24HR Place 1 patch onto the skin 2 (two) times a week. 03/12/21   Nunzio Cobbs, MD  ferrous sulfate 325 (65 FE) MG tablet Take 325 mg by mouth daily. 2 tablets daily 01/24/20   [provider]  meloxicam (MOBIC) 7.5 MG tablet Take 7.5 mg by mouth daily as needed for pain.  03/21/15   [provider]  mupirocin ointment (BACTROBAN) 2 % Apply 1 application topically See admin instructions. Apply to leg after showers 01/13/21   [provider]  omega-3 acid ethyl esters (LOVAZA) 1 g capsule Take 1 g by mouth daily.     [provider]  predniSONE (STERAPRED UNI-PAK 48 TAB) 5 MG (48) TBPK tablet See admin instructions. 08/07/21   [provider]  sodium chloride 1 g tablet Take 1 g by mouth daily. 08/21/19   [provider]  tretinoin (RETIN-A) 0.1 % cream Apply 1 application topically at bedtime as needed (breakouts). 04/29/20   [provider]      Allergies    Cephalexin, Shellfish allergy, Shellfish-derived products, Clindamycin/lincomycin, Lincomycin, Other, Penicillamine, Sugar-protein-starch, Tape, Ciprofloxacin, Sulfa antibiotics, and Sulfites    Review of Systems   Review of Systems  Physical Exam Updated Vital Signs BP (!) 170/89 (BP Location: Left Arm)   Pulse 83   Temp 98.2 F (36.8 C) (Oral)   Resp 20   SpO2 98%  Physical Exam  ED Results / Procedures / Treatments   Labs (all labs ordered are listed, but only abnormal results are displayed) Labs Reviewed - No data to display  EKG None  Radiology No results found.  Procedures Procedures  {Document cardiac monitor, telemetry assessment procedure when appropriate:1}  Medications Ordered in ED Medications - No data to display  ED Course/ Medical Decision  Making/ A&P                           Medical Decision Making ***  {Document critical care time when appropriate:1} {Document review of labs and clinical decision tools ie heart score, Chads2Vasc2 etc:1}  {Document your independent review of radiology images, and any outside records:1} {Document your discussion with family members, caretakers, and with consultants:1} {Document social determinants of health affecting pt's care:1} {Document your decision making why or why not admission, treatments were needed:1} Final Clinical Impression(s) / ED Diagnoses Final diagnoses:  None    Rx / DC Orders ED Discharge Orders     None

## 2021-09-02 DIAGNOSIS — F32A Depression, unspecified: Secondary | ICD-10-CM | POA: Diagnosis not present

## 2021-09-02 DIAGNOSIS — Z7401 Bed confinement status: Secondary | ICD-10-CM | POA: Diagnosis not present

## 2021-09-02 DIAGNOSIS — I10 Essential (primary) hypertension: Secondary | ICD-10-CM | POA: Diagnosis not present

## 2021-09-02 NOTE — ED Notes (Signed)
Discussed pts discharge with HPA/Legal guardian son Alveta Heimlich. Son informed pt visit, findings, and discharge instructions. Informed pt left our facility via [facility]

## 2021-09-02 NOTE — ED Notes (Signed)
Reviewed [facility]/discharge instruction with patient. Time allotted for and all questions answered. Patient is agreeable for d/c and transport back to assisted living facility.

## 2021-09-04 ENCOUNTER — Telehealth (INDEPENDENT_AMBULATORY_CARE_PROVIDER_SITE_OTHER): Payer: Medicare Other | Admitting: Nurse Practitioner

## 2021-09-04 ENCOUNTER — Encounter: Payer: Self-pay | Admitting: Nurse Practitioner

## 2021-09-04 DIAGNOSIS — M25561 Pain in right knee: Secondary | ICD-10-CM

## 2021-09-04 DIAGNOSIS — S81811D Laceration without foreign body, right lower leg, subsequent encounter: Secondary | ICD-10-CM | POA: Diagnosis not present

## 2021-09-06 DIAGNOSIS — W19XXXD Unspecified fall, subsequent encounter: Secondary | ICD-10-CM | POA: Diagnosis not present

## 2021-09-06 DIAGNOSIS — M25561 Pain in right knee: Secondary | ICD-10-CM | POA: Diagnosis not present

## 2021-09-06 DIAGNOSIS — N182 Chronic kidney disease, stage 2 (mild): Secondary | ICD-10-CM | POA: Diagnosis not present

## 2021-09-06 DIAGNOSIS — I712 Thoracic aortic aneurysm, without rupture, unspecified: Secondary | ICD-10-CM | POA: Diagnosis not present

## 2021-09-06 DIAGNOSIS — D631 Anemia in chronic kidney disease: Secondary | ICD-10-CM | POA: Diagnosis not present

## 2021-09-06 DIAGNOSIS — I129 Hypertensive chronic kidney disease with stage 1 through stage 4 chronic kidney disease, or unspecified chronic kidney disease: Secondary | ICD-10-CM | POA: Diagnosis not present

## 2021-09-06 DIAGNOSIS — Z9181 History of falling: Secondary | ICD-10-CM | POA: Diagnosis not present

## 2021-09-06 DIAGNOSIS — F32A Depression, unspecified: Secondary | ICD-10-CM | POA: Diagnosis not present

## 2021-09-06 DIAGNOSIS — I319 Disease of pericardium, unspecified: Secondary | ICD-10-CM | POA: Diagnosis not present

## 2021-09-06 DIAGNOSIS — Z87891 Personal history of nicotine dependence: Secondary | ICD-10-CM | POA: Diagnosis not present

## 2021-09-06 DIAGNOSIS — I351 Nonrheumatic aortic (valve) insufficiency: Secondary | ICD-10-CM | POA: Diagnosis not present

## 2021-09-06 DIAGNOSIS — S81811D Laceration without foreign body, right lower leg, subsequent encounter: Secondary | ICD-10-CM | POA: Diagnosis not present

## 2021-09-06 DIAGNOSIS — Z4800 Encounter for change or removal of nonsurgical wound dressing: Secondary | ICD-10-CM | POA: Diagnosis not present

## 2021-09-08 ENCOUNTER — Other Ambulatory Visit: Payer: Self-pay

## 2021-09-08 ENCOUNTER — Emergency Department (HOSPITAL_COMMUNITY)
Admission: EM | Admit: 2021-09-08 | Discharge: 2021-09-08 | Disposition: A | Discharge status: Skilled Nursing Facility | Payer: Medicare Other | Attending: Emergency Medicine | Admitting: Emergency Medicine

## 2021-09-08 ENCOUNTER — Encounter (HOSPITAL_COMMUNITY): Payer: Self-pay

## 2021-09-08 DIAGNOSIS — N189 Chronic kidney disease, unspecified: Secondary | ICD-10-CM | POA: Diagnosis not present

## 2021-09-08 DIAGNOSIS — S81819D Laceration without foreign body, unspecified lower leg, subsequent encounter: Secondary | ICD-10-CM | POA: Diagnosis not present

## 2021-09-08 DIAGNOSIS — Z4801 Encounter for change or removal of surgical wound dressing: Secondary | ICD-10-CM | POA: Diagnosis not present

## 2021-09-08 DIAGNOSIS — W19XXXA Unspecified fall, initial encounter: Secondary | ICD-10-CM | POA: Diagnosis not present

## 2021-09-08 DIAGNOSIS — Z7982 Long term (current) use of aspirin: Secondary | ICD-10-CM | POA: Insufficient documentation

## 2021-09-08 DIAGNOSIS — I129 Hypertensive chronic kidney disease with stage 1 through stage 4 chronic kidney disease, or unspecified chronic kidney disease: Secondary | ICD-10-CM | POA: Insufficient documentation

## 2021-09-08 DIAGNOSIS — Z5189 Encounter for other specified aftercare: Secondary | ICD-10-CM

## 2021-09-08 DIAGNOSIS — I10 Essential (primary) hypertension: Secondary | ICD-10-CM | POA: Diagnosis not present

## 2021-09-08 DIAGNOSIS — Z85828 Personal history of other malignant neoplasm of skin: Secondary | ICD-10-CM | POA: Diagnosis not present

## 2021-09-08 DIAGNOSIS — Z4800 Encounter for change or removal of nonsurgical wound dressing: Secondary | ICD-10-CM | POA: Diagnosis not present

## 2021-09-08 DIAGNOSIS — R609 Edema, unspecified: Secondary | ICD-10-CM | POA: Diagnosis not present

## 2021-09-08 MED ORDER — DOXYCYCLINE HYCLATE 100 MG PO CAPS
100.0000 mg | ORAL_CAPSULE | Freq: Two times a day (BID) | ORAL | 0 refills | Status: DC
Start: 2021-09-08 — End: 2021-10-06

## 2021-09-10 DIAGNOSIS — S81811D Laceration without foreign body, right lower leg, subsequent encounter: Secondary | ICD-10-CM | POA: Diagnosis not present

## 2021-09-10 DIAGNOSIS — N182 Chronic kidney disease, stage 2 (mild): Secondary | ICD-10-CM | POA: Diagnosis not present

## 2021-09-10 DIAGNOSIS — D631 Anemia in chronic kidney disease: Secondary | ICD-10-CM | POA: Diagnosis not present

## 2021-09-10 DIAGNOSIS — M25561 Pain in right knee: Secondary | ICD-10-CM | POA: Diagnosis not present

## 2021-09-10 DIAGNOSIS — W19XXXD Unspecified fall, subsequent encounter: Secondary | ICD-10-CM | POA: Diagnosis not present

## 2021-09-10 DIAGNOSIS — I129 Hypertensive chronic kidney disease with stage 1 through stage 4 chronic kidney disease, or unspecified chronic kidney disease: Secondary | ICD-10-CM | POA: Diagnosis not present

## 2021-09-21 DIAGNOSIS — M9901 Segmental and somatic dysfunction of cervical region: Secondary | ICD-10-CM | POA: Diagnosis not present

## 2021-09-21 DIAGNOSIS — M9903 Segmental and somatic dysfunction of lumbar region: Secondary | ICD-10-CM | POA: Diagnosis not present

## 2021-09-21 DIAGNOSIS — M4726 Other spondylosis with radiculopathy, lumbar region: Secondary | ICD-10-CM | POA: Diagnosis not present

## 2021-09-21 DIAGNOSIS — M9902 Segmental and somatic dysfunction of thoracic region: Secondary | ICD-10-CM | POA: Diagnosis not present

## 2021-09-28 DIAGNOSIS — E663 Overweight: Secondary | ICD-10-CM | POA: Diagnosis not present

## 2021-09-28 DIAGNOSIS — M064 Inflammatory polyarthropathy: Secondary | ICD-10-CM | POA: Diagnosis not present

## 2021-09-28 DIAGNOSIS — M1991 Primary osteoarthritis, unspecified site: Secondary | ICD-10-CM | POA: Diagnosis not present

## 2021-09-28 DIAGNOSIS — M1009 Idiopathic gout, multiple sites: Secondary | ICD-10-CM | POA: Diagnosis not present

## 2021-09-28 DIAGNOSIS — Z6825 Body mass index (BMI) 25.0-25.9, adult: Secondary | ICD-10-CM | POA: Diagnosis not present

## 2021-09-28 DIAGNOSIS — R21 Rash and other nonspecific skin eruption: Secondary | ICD-10-CM | POA: Diagnosis not present

## 2021-09-28 DIAGNOSIS — R768 Other specified abnormal immunological findings in serum: Secondary | ICD-10-CM | POA: Diagnosis not present

## 2021-09-28 DIAGNOSIS — L409 Psoriasis, unspecified: Secondary | ICD-10-CM | POA: Diagnosis not present

## 2021-10-05 ENCOUNTER — Ambulatory Visit: Payer: Medicare Other | Admitting: Interventional Cardiology

## 2021-10-05 DIAGNOSIS — L300 Nummular dermatitis: Secondary | ICD-10-CM | POA: Diagnosis not present

## 2021-10-05 DIAGNOSIS — D692 Other nonthrombocytopenic purpura: Secondary | ICD-10-CM | POA: Diagnosis not present

## 2021-10-05 DIAGNOSIS — L821 Other seborrheic keratosis: Secondary | ICD-10-CM | POA: Diagnosis not present

## 2021-10-05 DIAGNOSIS — L309 Dermatitis, unspecified: Secondary | ICD-10-CM | POA: Diagnosis not present

## 2021-10-05 DIAGNOSIS — L299 Pruritus, unspecified: Secondary | ICD-10-CM | POA: Diagnosis not present

## 2021-10-05 DIAGNOSIS — Z85828 Personal history of other malignant neoplasm of skin: Secondary | ICD-10-CM | POA: Diagnosis not present

## 2021-10-05 DIAGNOSIS — L245 Irritant contact dermatitis due to other chemical products: Secondary | ICD-10-CM | POA: Diagnosis not present

## 2021-10-06 ENCOUNTER — Encounter: Payer: Self-pay | Admitting: Family Medicine

## 2021-10-06 ENCOUNTER — Ambulatory Visit (INDEPENDENT_AMBULATORY_CARE_PROVIDER_SITE_OTHER): Payer: Medicare Other | Admitting: Family Medicine

## 2021-10-06 VITALS — BP 160/92 | HR 77 | Temp 96.9°F | Ht 62.75 in | Wt 154.6 lb

## 2021-10-06 DIAGNOSIS — I10 Essential (primary) hypertension: Secondary | ICD-10-CM | POA: Diagnosis not present

## 2021-10-06 DIAGNOSIS — N1831 Chronic kidney disease, stage 3a: Secondary | ICD-10-CM | POA: Diagnosis not present

## 2021-10-06 NOTE — Progress Notes (Signed)
Provider:  Alain Honey, MD  Careteam: Patient Care Team: Wardell Honour, MD as PCP - General (Family Medicine) Belva Crome, MD as PCP - Cardiology (Cardiology)  PLACE OF SERVICE:  [place]  Advanced Directive information    Allergies  Allergen Reactions   Cephalexin Other (See Comments)    Pt states this causes eye problems and STROKE-LIKE SYMPTOMS!! Other reaction(s): stroke symptoms Other reaction(s): Unknown   Shellfish Allergy Hives and Rash   Shellfish-Derived Products Hives and Rash    Other reaction(s): hives   Clindamycin/Lincomycin Rash   Lincomycin Rash   Other     Other reaction(s): rash   Penicillamine    Sugar-Protein-Starch Other (See Comments)    Sugary foods cause legs to cramp   Tape Other (See Comments)    Please use paper tape or Coban Wrap; patient's skin tears VERY easily!!   Ciprofloxacin Rash    Other reaction(s): rash   Sulfa Antibiotics Hives and Rash    Other reaction(s): Unknown   Sulfites Hives and Rash    Other reaction(s): swelling Other reaction(s): Unknown    No chief complaint on file.    HPI: Patient is a 88 y.o. female generally very healthy elderly female but today she has multiple symptoms including a pruritic rash.  Dermatologist started her on prednisone.  She is also having a flareup of gout and continues to take allopurinol in the face of flareup.  Also takes colchicine.  There is marked edema in her lower legs.  Prednisone as well as her chronic taking of sodium chloride likely as her bites this edema. There is also a history of anemia with stage IIIb chronic kidney disease.  Review of Systems:  Review of Systems  Constitutional: Negative.   Respiratory: Negative.    Cardiovascular:  Positive for leg swelling.  Musculoskeletal:  Positive for joint pain.  Skin:  Positive for itching and rash.  All other systems reviewed and are negative.   Past Medical History:  Diagnosis Date   Anemia    CHRONIC    Aortic insufficiency    a. 2D echo 08/29/15: EF 55-60%, mild LVH, diastolic dysfunction, elevated LV filling pressure, mild AI, severe LAE, mild RAE, mild TR, dilated descending thoracic aorta just distal to the takeoff of the left subclavian, measuring 4.1 cm, no pericardial effusion.   Arthritis    OA-SOME BACK AND NECK PAIN,  GOES TO CHIROPRACTOR TWICE A MONTH;  HX OF JOINT REPLACEMENTS    Cancer ([place])    SKIN CANCERS REMOVED FROM LEGS   CKD (chronic kidney disease), stage II    Depression    Essential hypertension    Gait abnormality 09/08/2017   GERD (gastroesophageal reflux disease)    Low sodium levels    Myocarditis (HCC)    PSVT (paroxysmal supraventricular tachycardia) (HCC)    Recurrent Pericarditis    RECURRENT    Thoracic aneurysm    a. Followed by [facility] -last seen in 12/2013 w/ plan for f/u MRA and thoracic surgery f/u in 2 yrs, but patient does not wish to follow any longer.   Past Surgical History:  Procedure Laterality Date   APPENDECTOMY     BREAST EXCISIONAL BIOPSY Left 1992   benign   EXCISION/RELEASE BURSA HIP Left 11/24/2012   Procedure: LEFT HIP BURSECTOMY AND TENDON REPAIR ;  Surgeon: Gearlean Alf, MD;  Location: [facility];  Service: Orthopedics;  Laterality: Left;   HIP CLOSED REDUCTION Left 12/19/2012   Procedure: CLOSED  MANIPULATION HIP;  Surgeon: Mauri Pole, MD;  Location: [facility];  Service: Orthopedics;  Laterality: Left;   HIP CLOSED REDUCTION Left 12/05/2013   Procedure: CLOSED MANIPULATION HIP;  Surgeon: Augustin Schooling, MD;  Location: [facility];  Service: Orthopedics;  Laterality: Left;   HIP CLOSED REDUCTION Left 09/22/2015   Procedure: CLOSED REDUCTION HIP;  Surgeon: Melina Schools, MD;  Location: [facility];  Service: Orthopedics;  Laterality: Left;   JOINT REPLACEMENT     LEFT TOTAL HIP REPLACEMENT AND REVISIONS X 2   OOPHORECTOMY     has a partial of one ovary remaingin   PELVIC LAPAROSCOPY     ovarian cyst removal,    REVISION TOTAL HIP ARTHROPLASTY      left   right Achilles Tendon repair  5/13   TOTAL KNEE ARTHROPLASTY     right   TOTAL KNEE ARTHROPLASTY     left   VAGINAL HYSTERECTOMY     with ovarian cyst removal   Social History:   reports that she quit smoking about 58 years ago. Her smoking use included cigarettes. She has never used smokeless tobacco. She reports that she does not currently use drugs. She reports that she does not drink alcohol.  Family History  Problem Relation Age of Onset   Hypertension Mother    Stroke Mother    Heart attack Father 28   Heart failure Father    Heart failure Sister    Aortic aneurysm Sister    Aortic aneurysm Sister    Stroke Sister    Pneumonia Brother    Stroke Brother    ALS Other    Hyperlipidemia Daughter    Aortic aneurysm Son    Breast cancer Neg Hx     Medications: Patient's Medications  New Prescriptions   No medications on file  Previous Medications   ACETAMINOPHEN (ACETAMINOPHEN EXTRA STRENGTH) 500 MG CAPSULE    1 capsule as needed   ALLOPURINOL (ZYLOPRIM) 100 MG TABLET    300 mg daily.   ASPIRIN 81 MG TABLET    Take 81 mg by mouth every evening.   B COMPLEX VITAMINS (VITAMIN B COMPLEX) TABS    1 tablet   BETAMETHASONE DIPROPIONATE 0.05 % CREAM    Apply topically 2 (two) times daily. Applied to the right ear canal twice daily for 4 to 5 days or until itching resolves.   CALCIUM CARBONATE-VIT D-MIN (CALCIUM 1200 PO)    Take 1 tablet by mouth every evening.   CARBOXYMETHYLCELLULOSE (REFRESH PLUS) 0.5 % SOLN    Place 1 drop into both eyes in the morning and at bedtime.   CARVEDILOL (COREG) 12.5 MG TABLET    Take 1 tablet (12.5 mg total) by mouth 2 (two) times daily with a meal. 1/2 in the Am; 1 in the PM   CHOLECALCIFEROL (VITAMIN D) 1000 UNITS TABLET    Take 1,000 Units by mouth daily. D3   CLOBETASOL CREAM (TEMOVATE) 0.05 %    Apply 1 application topically 2 (two) times daily as needed (itching).    COLCHICINE 0.6 MG CAPS    1 capsule   DILTIAZEM (CARDIZEM CD) 240  MG 24 HR CAPSULE    TAKE 1 CAPSULE DAILY       *OCEANSIDE MFR*   DOXYCYCLINE (VIBRAMYCIN) 100 MG CAPSULE    Take 1 capsule (100 mg total) by mouth 2 (two) times daily. One po bid x 7 days   ESTRADIOL (VIVELLE-DOT) 0.025 MG/24HR    Place 1 patch  onto the skin 2 (two) times a week.   FERROUS SULFATE 325 (65 FE) MG TABLET    Take 325 mg by mouth daily. 2 tablets daily   MELOXICAM (MOBIC) 7.5 MG TABLET    Take 7.5 mg by mouth daily as needed for pain.    MUPIROCIN OINTMENT (BACTROBAN) 2 %    Apply 1 application topically See admin instructions. Apply to leg after showers   OMEGA-3 ACID ETHYL ESTERS (LOVAZA) 1 G CAPSULE    Take 1 g by mouth daily.    PREDNISONE (STERAPRED UNI-PAK 48 TAB) 5 MG (48) TBPK TABLET    See admin instructions.   SODIUM CHLORIDE 1 G TABLET    Take 1 g by mouth daily.   TRETINOIN (RETIN-A) 0.1 % CREAM    Apply 1 application topically at bedtime as needed (breakouts).  Modified Medications   No medications on file  Discontinued Medications   No medications on file    Physical Exam:  There were no vitals filed for this visit. There is no height or weight on file to calculate BMI. Wt Readings from Last 3 Encounters:  08/19/21 150 lb 12.8 oz (68.4 kg)  08/05/21 150 lb 8 oz (68.3 kg)  06/10/21 149 lb 3.2 oz (67.7 kg)    Physical Exam Vitals and nursing note reviewed.  Constitutional:      Appearance: Normal appearance.  Cardiovascular:     Rate and Rhythm: Normal rate and regular rhythm.  Pulmonary:     Effort: Pulmonary effort is normal.     Breath sounds: Normal breath sounds.  Musculoskeletal:     Right lower leg: Edema present.     Left lower leg: Edema present.     Comments: 3+ edema lower extremities bilateral  Skin:    Comments: Papular rash primarily on lower legs unknown etiology per dermatologist  Neurological:     General: No focal deficit present.     Mental Status: She is alert and oriented to person, place, and time.     Labs reviewed: Basic  Metabolic Panel: Recent Labs    01/16/21 1748 01/18/21 0928 04/17/21 1331 07/15/21 1004 08/05/21 1039  NA 126*   < > 135 136 135  K 4.6   < > 5.1 5.0 4.7  CL 96*   < > 103 103 102  CO2 23   < > '24 24 27  '$ GLUCOSE 115*   < > 99 89 100*  BUN 28*   < > 31* 29* 33*  CREATININE 1.07*   < > 1.08* 1.05* 1.10*  CALCIUM 8.2*   < > 9.1 9.4 9.3  TSH 2.213  --   --   --   --    < > = values in this interval not displayed.   Liver Function Tests: Recent Labs    01/16/21 1748 02/17/21 1001 08/05/21 1039  AST '28 24 28  '$ ALT '26 16 19  '$ ALKPHOS 49 55 82  BILITOT 0.8 0.4 0.5  PROT 6.9 7.5 7.7  ALBUMIN 3.4* 3.8 4.0   No results for input(s): "LIPASE", "AMYLASE" in the last 8760 hours. No results for input(s): "AMMONIA" in the last 8760 hours. CBC: Recent Labs    02/17/21 1001 04/17/21 1331 05/18/21 1047 08/05/21 1039  WBC 8.6 9.1 8.9 9.2  NEUTROABS 4.8 4.9  --  4.6  HGB 8.7* 10.5* 10.0* 9.7*  HCT 27.4*  28.2* 32.7* 31.7* 29.8*  MCV 92.9 90.6 89.8 91.1  PLT 278 239 251 238  Lipid Panel: No results for input(s): "CHOL", "HDL", "LDLCALC", "TRIG", "CHOLHDL", "LDLDIRECT" in the last 8760 hours. TSH: Recent Labs    01/16/21 1748  TSH 2.213   A1C: No results found for: "HGBA1C"   Assessment/Plan  1. Stage 3a chronic kidney disease (HCC) Creatinine 1.91 with GFR 51  2. Essential hypertension Blood pressure is elevated today 160/92 likely multifactorial.  She does take carvedilol as well as diltiazem have recommended increase carvedilol to 12.5 mg twice daily and monitor blood pressure and heart rate  Alain Honey, MD [facility] [phone]

## 2021-10-06 NOTE — Patient Instructions (Signed)
Increase carvedilol to 1 tab twice a day Take fluid pill in AM

## 2021-10-07 ENCOUNTER — Ambulatory Visit: Payer: Medicare Other | Admitting: Interventional Cardiology

## 2021-10-09 ENCOUNTER — Ambulatory Visit: Payer: Medicare Other | Admitting: Interventional Cardiology

## 2021-10-14 DIAGNOSIS — L282 Other prurigo: Secondary | ICD-10-CM | POA: Diagnosis not present

## 2021-10-14 DIAGNOSIS — L309 Dermatitis, unspecified: Secondary | ICD-10-CM | POA: Diagnosis not present

## 2021-10-14 DIAGNOSIS — Z85828 Personal history of other malignant neoplasm of skin: Secondary | ICD-10-CM | POA: Diagnosis not present

## 2021-10-17 NOTE — Progress Notes (Signed)
Cardiology Office Note:    Date:  10/19/2021   ID:  [name], DOB 02-16-1930, MRN [id number]  PCP:  Wardell Honour, MD  Cardiologist:  Sinclair Grooms, MD   Referring MD: Wardell Honour, MD   Chief Complaint  Patient presents with   Hypertension   Thoracic Aortic Aneurysm    History of Present Illness:    [name] is a 88 y.o. female with a hx of  ascending aortic aneurysm (4.6 cm in diameter) for which she has refused subsequent f/u 2015, hypertension, mild aortic regurgitation, iron deficiency anemia, and history of paroxysmal SVT.   She feels well.  She confirms that she is DNR and wants [place] to redo a form because the 1 that she had at her residence was taken the last time she went to the doctor's office and she was unable to bring it back home with her.  Blood pressure is significantly elevated.  She denies orthopnea, PND, chest pain, dyspnea on exertion.  Past Medical History:  Diagnosis Date   Anemia    CHRONIC   Aortic insufficiency    a. 2D echo 08/29/15: EF 55-60%, mild LVH, diastolic dysfunction, elevated LV filling pressure, mild AI, severe LAE, mild RAE, mild TR, dilated descending thoracic aorta just distal to the takeoff of the left subclavian, measuring 4.1 cm, no pericardial effusion.   Arthritis    OA-SOME BACK AND NECK PAIN,  GOES TO CHIROPRACTOR TWICE A MONTH;  HX OF JOINT REPLACEMENTS    Cancer ([place])    SKIN CANCERS REMOVED FROM LEGS   CKD (chronic kidney disease), stage II    Depression    Essential hypertension    Gait abnormality 09/08/2017   GERD (gastroesophageal reflux disease)    Low sodium levels    Myocarditis (HCC)    PSVT (paroxysmal supraventricular tachycardia) (HCC)    Recurrent Pericarditis    RECURRENT    Thoracic aneurysm    a. Followed by [facility] -last seen in 12/2013 w/ plan for f/u MRA and thoracic surgery f/u in 2 yrs, but patient does not wish to follow any longer.    Past Surgical History:  Procedure  Laterality Date   APPENDECTOMY     BREAST EXCISIONAL BIOPSY Left 1992   benign   EXCISION/RELEASE BURSA HIP Left 11/24/2012   Procedure: LEFT HIP BURSECTOMY AND TENDON REPAIR ;  Surgeon: Gearlean Alf, MD;  [facility];  Service: Orthopedics;  Laterality: Left;   HIP CLOSED REDUCTION Left 12/19/2012   Procedure: CLOSED MANIPULATION HIP;  Surgeon: Mauri Pole, MD;  Location: [facility];  Service: Orthopedics;  Laterality: Left;   HIP CLOSED REDUCTION Left 12/05/2013   Procedure: CLOSED MANIPULATION HIP;  Surgeon: Augustin Schooling, MD;  Location: [facility];  Service: Orthopedics;  Laterality: Left;   HIP CLOSED REDUCTION Left 09/22/2015   Procedure: CLOSED REDUCTION HIP;  Surgeon: Melina Schools, MD;  Location: [facility];  Service: Orthopedics;  Laterality: Left;   JOINT REPLACEMENT     LEFT TOTAL HIP REPLACEMENT AND REVISIONS X 2   OOPHORECTOMY     has a partial of one ovary remaingin   PELVIC LAPAROSCOPY     ovarian cyst removal,    REVISION TOTAL HIP ARTHROPLASTY     left   right Achilles Tendon repair  5/13   TOTAL KNEE ARTHROPLASTY     right   TOTAL KNEE ARTHROPLASTY     left   VAGINAL HYSTERECTOMY  with ovarian cyst removal    Current Medications: Current Meds  Medication Sig   Acetaminophen (ACETAMINOPHEN EXTRA STRENGTH) 500 MG capsule 1 capsule as needed   allopurinol (ZYLOPRIM) 300 MG tablet Take 300 mg by mouth daily.   aspirin 81 MG tablet Take 81 mg by mouth every evening.   B Complex Vitamins (VITAMIN B COMPLEX) TABS 1 tablet   betamethasone dipropionate 0.05 % cream Apply topically 2 (two) times daily. Applied to the right ear canal twice daily for 4 to 5 days or until itching resolves.   Calcium Carbonate-Vit D-Min (CALCIUM 1200 PO) Take 1 tablet by mouth every evening.   carboxymethylcellulose (REFRESH PLUS) 0.5 % SOLN Place 1 drop into both eyes in the morning and at bedtime.   carvedilol (COREG) 12.5 MG tablet Take 1/2 tablet in the morning and 1 tablet in the  evening   cholecalciferol (VITAMIN D) 1000 UNITS tablet Take 1,000 Units by mouth daily. D3   Colchicine 0.6 MG CAPS 1 capsule   diltiazem (CARDIZEM CD) 240 MG 24 hr capsule TAKE 1 CAPSULE DAILY       *OCEANSIDE MFR*   estradiol (VIVELLE-DOT) 0.025 MG/24HR Place 1 patch onto the skin 2 (two) times a week.   ferrous sulfate 325 (65 FE) MG tablet Take 325 mg by mouth daily. 2 tablets daily   Multiple Vitamins-Minerals (SYSTANE ICAPS AREDS2 PO) Take by mouth.   mupirocin ointment (BACTROBAN) 2 % Apply 1 application topically See admin instructions. Apply to leg after showers   omega-3 acid ethyl esters (LOVAZA) 1 g capsule Take 1 g by mouth daily.    predniSONE (STERAPRED UNI-PAK 48 TAB) 5 MG (48) TBPK tablet See admin instructions.   sodium chloride 1 g tablet Take 1 g by mouth daily.   tretinoin (RETIN-A) 0.1 % cream Apply 1 application topically at bedtime as needed (breakouts).     Allergies:   Cephalexin, Shellfish allergy, Shellfish-derived products, Clindamycin/lincomycin, Lincomycin, Other, Penicillamine, Sugar-protein-starch, Tape, Ciprofloxacin, Sulfa antibiotics, and Sulfites   Social History   Socioeconomic History   Marital status: Widowed    Spouse name: Not on file   Number of children: Not on file   Years of education: Not on file   Highest education level: Not on file  Occupational History   Not on file  Tobacco Use   Smoking status: Former    Years: 5.00    Types: Cigarettes    Quit date: 56    Years since quitting: 58.6   Smokeless tobacco: Never   Tobacco comments:    quit age 17  Vaping Use   Vaping Use: Never used  Substance and Sexual Activity   Alcohol use: No    Alcohol/week: 0.0 standard drinks of alcohol   Drug use: Not Currently   Sexual activity: Not Currently    Birth control/protection: Surgical    Comment: HYST-1st intercourse 88 yo-Fewer than 5 partners  Other Topics Concern   Not on file  Social History Narrative   Diet: very good       Caffeine: 1 1/2 cup coffee (usually decaf)      Married, if yes what year: Widow, 1954      Do you live in a house, apartment, assisted living, condo, trailer, ect: [place], independent living      Is it one or more stories: 2nd level. elevate      How many persons live in your home? 1      Pets: No  Highest level or education completed: 14      Current/Past profession: Actuary      Exercise:  Yes                Type and how often: aerobics twice daily         Living Will: Yes   DNR: Yes   POA/HPOA: Yes      Functional Status:   Do you have difficulty bathing or dressing yourself? No   Do you have difficulty preparing food or eating? No   Do you have difficulty managing your medications? No   Do you have difficulty managing your finances? No   Do you have difficulty affording your medications? No   Social Determinants of Radio broadcast assistant Strain: Not on file  Food Insecurity: Not on file  Transportation Needs: Not on file  Physical Activity: Not on file  Stress: Not on file  Social Connections: Not on file     Family History: The patient's family history includes ALS in an other family member; Aortic aneurysm in her sister, sister, and son; Heart attack (age of onset: 66) in her father; Heart failure in her father and sister; Hyperlipidemia in her daughter; Hypertension in her mother; Pneumonia in her brother; Stroke in her brother, mother, and sister. There is no history of Breast cancer.  ROS:   Please see the history of present illness.    She tells me she has renal impairment.  Her most recent creatinine was 1.1 and BUN was 33.  She has been told to stay off nonsteroidal anti-inflammatory agents.  All other systems reviewed and are negative.  EKGs/Labs/Other Studies Reviewed:    The following studies were reviewed today:  2 D Doppler ECHOCARDIOGRAM 2022: IMPRESSIONS   1. Left ventricular ejection fraction, by estimation, is  60 to 65%. The  left ventricle has normal function. The left ventricle has no regional  wall motion abnormalities. There is mild asymmetric left ventricular  hypertrophy of the basal and septal  segments. Left ventricular diastolic parameters were normal.   2. Right ventricular systolic function is normal. The right ventricular  size is normal.   3. The pericardial effusion is posterior to the left ventricle.   4. The mitral valve is abnormal. Mild mitral valve regurgitation. No  evidence of mitral stenosis.   5. Tricuspid valve regurgitation is mild to moderate.   6. The aortic valve is tricuspid. There is mild calcification of the  aortic valve. Aortic valve regurgitation is moderate. Mild aortic valve  sclerosis is present, with no evidence of aortic valve stenosis.   7. Aortic dilatation noted. There is severe dilatation of the ascending  aorta, measuring 46 mm.   8. The inferior vena cava is normal in size with greater than 50%  respiratory variability, suggesting right atrial pressure of 3 mmHg.   EKG:  EKG sinus rhythm, biatrial abnormality, otherwise normal.  No changes noted when compared to 2022.  Recent Labs: 01/16/2021: TSH 2.213 08/05/2021: ALT 19; BUN 33; Creatinine 1.10; Hemoglobin 9.7; Platelets 238; Potassium 4.7; Sodium 135  Recent Lipid Panel No results found for: "CHOL", "TRIG", "HDL", "CHOLHDL", "VLDL", "LDLCALC", "LDLDIRECT"  Physical Exam:    VS:  Ht 5' 2.75" (1.594 m)   BMI 27.60 kg/m     Wt Readings from Last 3 Encounters:  10/06/21 154 lb 9.6 oz (70.1 kg)  08/19/21 150 lb 12.8 oz (68.4 kg)  08/05/21 150 lb 8 oz (68.3 kg)  GEN: Slender and appears younger than stated age. No acute distress HEENT: Normal NECK: No JVD. LYMPHATICS: No lymphadenopathy CARDIAC: 3/6 decrescendo diastolic aortic regurgitation murmur. RRR S4 gallop, or edema. VASCULAR:  Normal Pulses. No bruits. RESPIRATORY:  Clear to auscultation without rales, wheezing or rhonchi   ABDOMEN: Soft, non-tender, non-distended, No pulsatile mass, MUSCULOSKELETAL: No deformity  SKIN: Warm and dry NEUROLOGIC:  Alert and oriented x 3 PSYCHIATRIC:  Normal affect   ASSESSMENT:    1. Nonrheumatic aortic valve insufficiency   2. Essential hypertension   3. Aneurysm of ascending aorta without rupture (HCC)    PLAN:    In order of problems listed above:  Renal insufficiency and therefore ACE/ARB plus diuretic would be somewhat dicey.  She is not desire any follow-up imaging on her aorta or aortic valve as she would not allow any treatment of the medical therapy. Ace/ARB plus diuretic was stress her kidneys.  She has been told that she has CKD.  She is against anything that would aggravate the problem.  Start amlodipine 5 mg/day. We can do better with her blood pressure.  Amlodipine added.  Consider adding ACE/ARB.  She has had a tendency in the past to self adjust her medications based upon her blood pressure recordings.  Conservative management of significant aortic regurgitation and aortic aneurysm.  No further imaging.  DNR.  Monitor blood pressures at home.  Notify us if they remain high.  Follow-up in 1 year with new cardiologist.   Medication Adjustments/Labs and Tests Ordered: Current medicines are reviewed at length with the patient today.  Concerns regarding medicines are outlined above.  No orders of the defined types were placed in this encounter.  No orders of the defined types were placed in this encounter.   There are no Patient Instructions on file for this visit.   Signed, Sinclair Grooms, MD  10/19/2021 2:00 PM    [place]

## 2021-10-19 ENCOUNTER — Ambulatory Visit (INDEPENDENT_AMBULATORY_CARE_PROVIDER_SITE_OTHER): Payer: Medicare Other | Admitting: Interventional Cardiology

## 2021-10-19 ENCOUNTER — Encounter: Payer: Self-pay | Admitting: Interventional Cardiology

## 2021-10-19 VITALS — BP 200/80 | HR 99 | Ht 62.75 in | Wt 148.2 lb

## 2021-10-19 DIAGNOSIS — I351 Nonrheumatic aortic (valve) insufficiency: Secondary | ICD-10-CM

## 2021-10-19 DIAGNOSIS — I10 Essential (primary) hypertension: Secondary | ICD-10-CM | POA: Diagnosis not present

## 2021-10-19 DIAGNOSIS — I7121 Aneurysm of the ascending aorta, without rupture: Secondary | ICD-10-CM

## 2021-10-19 MED ORDER — AMLODIPINE BESYLATE 5 MG PO TABS: 5.0000 mg | ORAL_TABLET | Freq: Every day | ORAL | 3 refills | Status: DC

## 2021-10-19 NOTE — Patient Instructions (Signed)
Medication Instructions:  Your physician has recommended you make the following change in your medication:   1) START Amlodipine '5mg'$  daily  *If you need a refill on your cardiac medications before your next appointment, please call your pharmacy*  Lab Work: NONE  Testing/Procedures: NONE  Follow-Up: At [facility], you and your health needs are our priority.  As part of our continuing mission to provide you with exceptional heart care, we have created designated Provider Care Teams.  These Care Teams include your primary Cardiologist (physician) and Advanced Practice Providers (APPs -  Physician Assistants and Nurse Practitioners) who all work together to provide you with the care you need, when you need it.  Your next appointment:   1 year(s)  The format for your next appointment:   In Person  Provider:   Sinclair Grooms, MD {   Important Information About Sugar

## 2021-10-21 DIAGNOSIS — E871 Hypo-osmolality and hyponatremia: Secondary | ICD-10-CM | POA: Diagnosis not present

## 2021-10-21 DIAGNOSIS — D509 Iron deficiency anemia, unspecified: Secondary | ICD-10-CM | POA: Diagnosis not present

## 2021-10-21 DIAGNOSIS — N1832 Chronic kidney disease, stage 3b: Secondary | ICD-10-CM | POA: Diagnosis not present

## 2021-10-21 DIAGNOSIS — I10 Essential (primary) hypertension: Secondary | ICD-10-CM | POA: Diagnosis not present

## 2021-10-21 DIAGNOSIS — E782 Mixed hyperlipidemia: Secondary | ICD-10-CM | POA: Diagnosis not present

## 2021-10-21 DIAGNOSIS — M1030 Gout due to renal impairment, unspecified site: Secondary | ICD-10-CM | POA: Diagnosis not present

## 2021-10-26 ENCOUNTER — Other Ambulatory Visit: Payer: Self-pay | Admitting: Interventional Cardiology

## 2021-10-26 DIAGNOSIS — M9902 Segmental and somatic dysfunction of thoracic region: Secondary | ICD-10-CM | POA: Diagnosis not present

## 2021-10-26 DIAGNOSIS — M9903 Segmental and somatic dysfunction of lumbar region: Secondary | ICD-10-CM | POA: Diagnosis not present

## 2021-10-26 DIAGNOSIS — M9901 Segmental and somatic dysfunction of cervical region: Secondary | ICD-10-CM | POA: Diagnosis not present

## 2021-10-26 DIAGNOSIS — M4726 Other spondylosis with radiculopathy, lumbar region: Secondary | ICD-10-CM | POA: Diagnosis not present

## 2021-11-02 DIAGNOSIS — M9903 Segmental and somatic dysfunction of lumbar region: Secondary | ICD-10-CM | POA: Diagnosis not present

## 2021-11-02 DIAGNOSIS — M9901 Segmental and somatic dysfunction of cervical region: Secondary | ICD-10-CM | POA: Diagnosis not present

## 2021-11-02 DIAGNOSIS — M4726 Other spondylosis with radiculopathy, lumbar region: Secondary | ICD-10-CM | POA: Diagnosis not present

## 2021-11-02 DIAGNOSIS — M9902 Segmental and somatic dysfunction of thoracic region: Secondary | ICD-10-CM | POA: Diagnosis not present

## 2021-11-09 DIAGNOSIS — M4726 Other spondylosis with radiculopathy, lumbar region: Secondary | ICD-10-CM | POA: Diagnosis not present

## 2021-11-09 DIAGNOSIS — M9903 Segmental and somatic dysfunction of lumbar region: Secondary | ICD-10-CM | POA: Diagnosis not present

## 2021-11-09 DIAGNOSIS — M9902 Segmental and somatic dysfunction of thoracic region: Secondary | ICD-10-CM | POA: Diagnosis not present

## 2021-11-09 DIAGNOSIS — M9901 Segmental and somatic dysfunction of cervical region: Secondary | ICD-10-CM | POA: Diagnosis not present

## 2021-11-11 ENCOUNTER — Ambulatory Visit (INDEPENDENT_AMBULATORY_CARE_PROVIDER_SITE_OTHER): Payer: Medicare Other | Admitting: Podiatry

## 2021-11-11 ENCOUNTER — Encounter: Payer: Self-pay | Admitting: Podiatry

## 2021-11-11 DIAGNOSIS — L308 Other specified dermatitis: Secondary | ICD-10-CM | POA: Diagnosis not present

## 2021-11-11 DIAGNOSIS — M2031 Hallux varus (acquired), right foot: Secondary | ICD-10-CM

## 2021-11-11 DIAGNOSIS — B351 Tinea unguium: Secondary | ICD-10-CM

## 2021-11-11 DIAGNOSIS — Q828 Other specified congenital malformations of skin: Secondary | ICD-10-CM | POA: Diagnosis not present

## 2021-11-11 DIAGNOSIS — L608 Other nail disorders: Secondary | ICD-10-CM

## 2021-11-11 DIAGNOSIS — M79674 Pain in right toe(s): Secondary | ICD-10-CM

## 2021-11-11 DIAGNOSIS — D692 Other nonthrombocytopenic purpura: Secondary | ICD-10-CM | POA: Diagnosis not present

## 2021-11-11 DIAGNOSIS — M79675 Pain in left toe(s): Secondary | ICD-10-CM | POA: Diagnosis not present

## 2021-11-11 DIAGNOSIS — L820 Inflamed seborrheic keratosis: Secondary | ICD-10-CM | POA: Diagnosis not present

## 2021-11-11 DIAGNOSIS — Z85828 Personal history of other malignant neoplasm of skin: Secondary | ICD-10-CM | POA: Diagnosis not present

## 2021-11-11 DIAGNOSIS — L309 Dermatitis, unspecified: Secondary | ICD-10-CM | POA: Diagnosis not present

## 2021-11-11 NOTE — Progress Notes (Signed)

## 2021-11-18 DIAGNOSIS — R300 Dysuria: Secondary | ICD-10-CM | POA: Diagnosis not present

## 2021-11-23 DIAGNOSIS — M4726 Other spondylosis with radiculopathy, lumbar region: Secondary | ICD-10-CM | POA: Diagnosis not present

## 2021-11-23 DIAGNOSIS — M9901 Segmental and somatic dysfunction of cervical region: Secondary | ICD-10-CM | POA: Diagnosis not present

## 2021-11-23 DIAGNOSIS — M9902 Segmental and somatic dysfunction of thoracic region: Secondary | ICD-10-CM | POA: Diagnosis not present

## 2021-11-23 DIAGNOSIS — M9903 Segmental and somatic dysfunction of lumbar region: Secondary | ICD-10-CM | POA: Diagnosis not present

## 2021-11-26 DIAGNOSIS — R2689 Other abnormalities of gait and mobility: Secondary | ICD-10-CM | POA: Diagnosis not present

## 2021-11-26 DIAGNOSIS — R2681 Unsteadiness on feet: Secondary | ICD-10-CM | POA: Diagnosis not present

## 2021-11-26 DIAGNOSIS — Z9181 History of falling: Secondary | ICD-10-CM | POA: Diagnosis not present

## 2021-11-26 DIAGNOSIS — M6281 Muscle weakness (generalized): Secondary | ICD-10-CM | POA: Diagnosis not present

## 2021-11-30 DIAGNOSIS — S0101XA Laceration without foreign body of scalp, initial encounter: Secondary | ICD-10-CM | POA: Insufficient documentation

## 2021-11-30 DIAGNOSIS — Z7982 Long term (current) use of aspirin: Secondary | ICD-10-CM | POA: Insufficient documentation

## 2021-11-30 DIAGNOSIS — W01198A Fall on same level from slipping, tripping and stumbling with subsequent striking against other object, initial encounter: Secondary | ICD-10-CM | POA: Insufficient documentation

## 2021-11-30 DIAGNOSIS — R220 Localized swelling, mass and lump, head: Secondary | ICD-10-CM | POA: Diagnosis not present

## 2021-11-30 DIAGNOSIS — M4802 Spinal stenosis, cervical region: Secondary | ICD-10-CM | POA: Diagnosis not present

## 2021-11-30 DIAGNOSIS — Z79899 Other long term (current) drug therapy: Secondary | ICD-10-CM | POA: Diagnosis not present

## 2021-11-30 DIAGNOSIS — Y92121 Bathroom in nursing home as the place of occurrence of the external cause: Secondary | ICD-10-CM | POA: Diagnosis not present

## 2021-11-30 DIAGNOSIS — S0990XA Unspecified injury of head, initial encounter: Secondary | ICD-10-CM | POA: Diagnosis not present

## 2021-11-30 DIAGNOSIS — I10 Essential (primary) hypertension: Secondary | ICD-10-CM | POA: Diagnosis not present

## 2021-11-30 DIAGNOSIS — R58 Hemorrhage, not elsewhere classified: Secondary | ICD-10-CM | POA: Diagnosis not present

## 2021-11-30 DIAGNOSIS — N390 Urinary tract infection, site not specified: Secondary | ICD-10-CM | POA: Diagnosis not present

## 2021-12-01 ENCOUNTER — Other Ambulatory Visit: Payer: Self-pay

## 2021-12-01 ENCOUNTER — Emergency Department (HOSPITAL_COMMUNITY): Payer: Medicare Other

## 2021-12-01 ENCOUNTER — Emergency Department (HOSPITAL_COMMUNITY)
Admission: EM | Admit: 2021-12-01 | Discharge: 2021-12-01 | Disposition: A | Discharge status: Skilled Nursing Facility | Payer: Medicare Other | Attending: Emergency Medicine | Admitting: Emergency Medicine

## 2021-12-01 ENCOUNTER — Encounter (HOSPITAL_COMMUNITY): Payer: Self-pay | Admitting: Emergency Medicine

## 2021-12-01 DIAGNOSIS — S0990XA Unspecified injury of head, initial encounter: Secondary | ICD-10-CM | POA: Diagnosis not present

## 2021-12-01 DIAGNOSIS — W19XXXA Unspecified fall, initial encounter: Secondary | ICD-10-CM | POA: Diagnosis not present

## 2021-12-01 DIAGNOSIS — Z7401 Bed confinement status: Secondary | ICD-10-CM | POA: Diagnosis not present

## 2021-12-01 DIAGNOSIS — M4802 Spinal stenosis, cervical region: Secondary | ICD-10-CM | POA: Diagnosis not present

## 2021-12-01 DIAGNOSIS — R220 Localized swelling, mass and lump, head: Secondary | ICD-10-CM | POA: Diagnosis not present

## 2021-12-01 DIAGNOSIS — I10 Essential (primary) hypertension: Secondary | ICD-10-CM | POA: Diagnosis not present

## 2021-12-01 DIAGNOSIS — S0101XA Laceration without foreign body of scalp, initial encounter: Secondary | ICD-10-CM | POA: Diagnosis not present

## 2021-12-01 LAB — CBC WITH DIFFERENTIAL/PLATELET
Abs Immature Granulocytes: 0.05 10*3/uL (ref 0.00–0.07)
Basophils Absolute: 0.2 10*3/uL — ABNORMAL HIGH (ref 0.0–0.1)
Basophils Relative: 3 %
Eosinophils Absolute: 0.7 10*3/uL — ABNORMAL HIGH (ref 0.0–0.5)
Eosinophils Relative: 9 %
HCT: 29.7 % — ABNORMAL LOW (ref 36.0–46.0)
Hemoglobin: 9.3 g/dL — ABNORMAL LOW (ref 12.0–15.0)
Immature Granulocytes: 1 %
Lymphocytes Relative: 25 %
Lymphs Abs: 2.1 10*3/uL (ref 0.7–4.0)
MCH: 28.4 pg (ref 26.0–34.0)
MCHC: 31.3 g/dL (ref 30.0–36.0)
MCV: 90.8 fL (ref 80.0–100.0)
Monocytes Absolute: 1 10*3/uL (ref 0.1–1.0)
Monocytes Relative: 12 %
Neutro Abs: 4.2 10*3/uL (ref 1.7–7.7)
Neutrophils Relative %: 50 %
Platelets: 322 10*3/uL (ref 150–400)
RBC: 3.27 MIL/uL — ABNORMAL LOW (ref 3.87–5.11)
RDW: 14.8 % (ref 11.5–15.5)
WBC: 8.3 10*3/uL (ref 4.0–10.5)
nRBC: 0 % (ref 0.0–0.2)

## 2021-12-01 LAB — BASIC METABOLIC PANEL
Anion gap: 10 (ref 5–15)
BUN: 25 mg/dL — ABNORMAL HIGH (ref 8–23)
CO2: 21 mmol/L — ABNORMAL LOW (ref 22–32)
Calcium: 9.3 mg/dL (ref 8.9–10.3)
Chloride: 102 mmol/L (ref 98–111)
Creatinine, Ser: 0.98 mg/dL (ref 0.44–1.00)
GFR, Estimated: 55 mL/min — ABNORMAL LOW (ref >60)
Glucose, Bld: 104 mg/dL — ABNORMAL HIGH (ref 70–99)
Potassium: 4.6 mmol/L (ref 3.5–5.1)
Sodium: 133 mmol/L — ABNORMAL LOW (ref 135–145)

## 2021-12-01 LAB — RAPID URINE DRUG SCREEN, HOSP PERFORMED
Amphetamines: NOT DETECTED
Barbiturates: NOT DETECTED
Benzodiazepines: NOT DETECTED
Cocaine: NOT DETECTED
Opiates: NOT DETECTED
Tetrahydrocannabinol: NOT DETECTED

## 2021-12-01 LAB — URINALYSIS, ROUTINE W REFLEX MICROSCOPIC
Bacteria, UA: NONE SEEN
Bilirubin Urine: NEGATIVE
Glucose, UA: NEGATIVE mg/dL
Ketones, ur: NEGATIVE mg/dL
Leukocytes,Ua: NEGATIVE
Nitrite: NEGATIVE
Protein, ur: NEGATIVE mg/dL
Specific Gravity, Urine: 1.005 (ref 1.005–1.030)
pH: 5 (ref 5.0–8.0)

## 2021-12-01 MED ORDER — BACITRACIN ZINC 500 UNIT/GM EX OINT: TOPICAL_OINTMENT | Freq: Two times a day (BID) | CUTANEOUS | Status: DC

## 2021-12-01 NOTE — ED Notes (Signed)
Pt asked if she could have something to eat that she was starving. This RN spoke with PA Rebekah who stated pt could have whatever she wanted. Pt was given a sandwich and a cup of water. Will continue to monitor.

## 2021-12-01 NOTE — ED Provider Triage Note (Signed)
Emergency Medicine Provider Triage Evaluation Note  Merilyn Baba , a 88 y.o. female  was evaluated in triage.  Pt complains of fall, states she is on her way back from the bathroom and fell, states that she landed on her head, does not believe that she lost conscious, she is noted that she has slight headache, slight neck pain, but no pain in her back chest arms legs or abdomen, states that she is being treated for UTI and states that the medicine has made her loopy she is not on blood thinners..  Review of Systems  Positive: Head and neck pain Negative: Chest pain, shortness of breath  Physical Exam  BP (!) 189/87   Pulse 71   Temp 98.3 F (36.8 C) (Oral)   Resp 16   Ht 5' 2.75" (1.594 m)   Wt 67.2 kg   SpO2 96%   BMI 26.45 kg/m  Gen:   Awake, no distress   Resp:  Normal effort  MSK:   Moves extremities without difficulty  Other:  Cranial nerves II through XII grossly intact no difficulty with word finding function no unilateral weakness present.  Medical Decision Making  Medically screening exam initiated at 12:27 AM.  Appropriate orders placed.  Merilyn Baba was informed that the remainder of the evaluation will be completed by another provider, this initial triage assessment does not replace that evaluation, and the importance of remaining in the ED until their evaluation is complete.  Lab work imaging been ordered will need further work-up.     Marcello Fennel, PA-C 12/01/21 0028

## 2021-12-01 NOTE — Discharge Instructions (Signed)
You were seen here today for your fall.  Your blood work and CT scans were reassuring.  You not have any dangerous injury from your fall today.  You do have a small cut on the back of your head that did not require repair in the ER.  May apply antibiotic limited to this area and use Tylenol as needed for any soreness from your fall.  Follow-up with your primary care doctor to confirm resolution of your urinary tract infection after completion of your antibiotics and return to the ER with any severe symptoms.

## 2021-12-01 NOTE — ED Provider Notes (Signed)
[facility] EMERGENCY DEPARTMENT Provider Note   CSN: [id number] Arrival date & time: 11/30/21  2356     History  Chief Complaint  Patient presents with   [name] is a 88 y.o. female who presents from [place] facility after unwitnessed fall while in the bathroom.  She states that she just is being treated for urinary tract infection and has been feeling unsteady for the last couple of days and did not take her walker with her when she tried to go to the bathroom.  As she was on her way back to her bed she fell and hit the back of her head on the dresser.  No LOC nausea vomiting blurry double vision since that time.  Fall was heard by facility staff who quickly came and helped her from the ground.  She was then transported to the ED.  She does state that she has been feeling more unsteady on her feet since starting the new antibiotic so she cannot time the name of this.  Collateral history obtained from her daughter [name] who states that she completed course of nitrofurantoin but her UTI did not clear and she was started on a course of 3 separate doses of fosfomycin which appears to be the antibiotic that she does not tolerate well.  No fevers chills nausea vomiting or diarrhea recently.  I personally reviewed her medical records.  In addition the pelvis history she has history of aortic insufficiency degenerative disc disease in the back and CKD stage III as well as hypertension.  She has known history of thoracic aortic aneurysm and follows for reevaluation of this in the outpatient setting.  She is not anticoagulated.  HPI     Home Medications Prior to Admission medications   Medication Sig Start Date End Date Taking? Authorizing Provider  Acetaminophen (ACETAMINOPHEN EXTRA STRENGTH) 500 MG capsule 1 capsule as needed    [provider]  allopurinol (ZYLOPRIM) 300 MG tablet Take 300 mg by mouth daily.    [provider]   amLODipine (NORVASC) 5 MG tablet Take 1 tablet (5 mg total) by mouth daily. 10/19/21   Belva Crome, MD  aspirin 81 MG tablet Take 81 mg by mouth every evening.    [provider]  B Complex Vitamins (VITAMIN B COMPLEX) TABS 1 tablet    [provider]  betamethasone dipropionate 0.05 % cream Apply topically 2 (two) times daily. Applied to the right ear canal twice daily for 4 to 5 days or until itching resolves. 12/22/20   Rozetta Nunnery, MD  Calcium Carbonate-Vit D-Min (CALCIUM 1200 PO) Take 1 tablet by mouth every evening.    [provider]  carboxymethylcellulose (REFRESH PLUS) 0.5 % SOLN Place 1 drop into both eyes in the morning and at bedtime.    [provider]  carvedilol (COREG) 12.5 MG tablet Take 1 tablet (12.5 mg total) by mouth 2 (two) times daily with a meal. 10/27/21   Belva Crome, MD  cholecalciferol (VITAMIN D) 1000 UNITS tablet Take 1,000 Units by mouth daily. D3    [provider]  Colchicine 0.6 MG CAPS 1 capsule 04/08/21   [provider]  diltiazem (CARDIZEM CD) 240 MG 24 hr capsule Take 1 capsule (240 mg total) by mouth daily. 10/27/21   Belva Crome, MD  estradiol (VIVELLE-DOT) 0.025 MG/24HR Place 1 patch onto the skin 2 (two) times a week. 03/12/21   [place]on  Raliegh Ip, MD  ferrous sulfate 325 (65 FE) MG tablet Take 325 mg by mouth daily. 2 tablets daily 01/24/20   [provider]  Multiple Vitamins-Minerals (SYSTANE ICAPS AREDS2 PO) Take by mouth.    [provider]  mupirocin ointment (BACTROBAN) 2 % Apply 1 application topically See admin instructions. Apply to leg after showers 01/13/21   [provider]  omega-3 acid ethyl esters (LOVAZA) 1 g capsule Take 1 g by mouth daily.     [provider]  predniSONE (STERAPRED UNI-PAK 48 TAB) 5 MG (48) TBPK tablet See admin instructions. 08/07/21   [provider]  sodium chloride 1 g tablet Take 1 g by mouth daily.  08/21/19   [provider]  tretinoin (RETIN-A) 0.1 % cream Apply 1 application topically at bedtime as needed (breakouts). 04/29/20   [provider]      Allergies    Cephalexin, Shellfish allergy, Shellfish-derived products, Clindamycin/lincomycin, Lincomycin, Other, Penicillamine, Sugar-protein-starch, Tape, Ciprofloxacin, Sulfa antibiotics, and Sulfites    Review of Systems   Review of Systems  Skin:  Positive for wound.  Neurological:  Positive for light-headedness.    Physical Exam Updated Vital Signs BP (!) 169/88   Pulse 81   Temp (!) 97.4 F (36.3 C) (Oral)   Resp 15   Ht 5' 2.75" (1.594 m)   Wt 67.2 kg   SpO2 100%   BMI 26.45 kg/m  Physical Exam Vitals and nursing note reviewed.  Constitutional:      Appearance: She is not ill-appearing or toxic-appearing.  HENT:     Head: No raccoon eyes.      Nose: Nose normal.     Mouth/Throat:     Mouth: Mucous membranes are moist.     Pharynx: Oropharynx is clear. Uvula midline. No oropharyngeal exudate or posterior oropharyngeal erythema.  Eyes:     General: Lids are normal. Vision grossly intact.        Right eye: No discharge.        Left eye: No discharge.     Extraocular Movements: Extraocular movements intact.     Conjunctiva/sclera: Conjunctivae normal.     Pupils: Pupils are equal, round, and reactive to light.  Neck:     Trachea: Trachea and phonation normal.  Cardiovascular:     Rate and Rhythm: Normal rate and regular rhythm.     Pulses: Normal pulses.     Heart sounds: Normal heart sounds. No murmur heard. Pulmonary:     Effort: Pulmonary effort is normal. No respiratory distress.     Breath sounds: Normal breath sounds. No wheezing or rales.  Abdominal:     General: Bowel sounds are normal. There is no distension.     Palpations: Abdomen is soft.     Tenderness: There is no abdominal tenderness. There is no guarding or rebound.  Musculoskeletal:        General: No deformity.      Cervical back: Normal range of motion and neck supple.     Right lower leg: No edema.     Left lower leg: No edema.  Lymphadenopathy:     Cervical: No cervical adenopathy.  Skin:    General: Skin is warm and dry.     Capillary Refill: Capillary refill takes less than 2 seconds.  Neurological:     General: No focal deficit present.     Mental Status: She is alert and oriented to person, place, and time. Mental status is at  baseline.     GCS: GCS eye subscore is 4. GCS verbal subscore is 5. GCS motor subscore is 6.     Gait: Gait is intact.     Comments: Walks with walker at baseline, ambulated to and from the bathroom in the ED without difficulty using walker.  Denied any lightheadedness or sensation of instability.  Psychiatric:        Mood and Affect: Mood normal.     ED Results / Procedures / Treatments   Labs (all labs ordered are listed, but only abnormal results are displayed) Labs Reviewed  CBC WITH DIFFERENTIAL/PLATELET - Abnormal; Notable for the following components:      Result Value   RBC 3.27 (*)    Hemoglobin 9.3 (*)    HCT 29.7 (*)    Eosinophils Absolute 0.7 (*)    Basophils Absolute 0.2 (*)    All other components within normal limits  BASIC METABOLIC PANEL - Abnormal; Notable for the following components:   Sodium 133 (*)    CO2 21 (*)    Glucose, Bld 104 (*)    BUN 25 (*)    GFR, Estimated 55 (*)    All other components within normal limits  URINALYSIS, ROUTINE W REFLEX MICROSCOPIC - Abnormal; Notable for the following components:   Color, Urine STRAW (*)    Hgb urine dipstick SMALL (*)    All other components within normal limits  URINE CULTURE  RAPID URINE DRUG SCREEN, HOSP PERFORMED    EKG None  Radiology CT Head Wo Contrast  Result Date: 12/01/2021 CLINICAL DATA:  Head trauma, minor (Age >= 65y); Neck trauma (Age >= 65y). Fall EXAM: CT HEAD WITHOUT CONTRAST CT CERVICAL SPINE WITHOUT CONTRAST TECHNIQUE: Multidetector CT imaging of the head and  cervical spine was performed following the standard protocol without intravenous contrast. Multiplanar CT image reconstructions of the cervical spine were also generated. RADIATION DOSE REDUCTION: This exam was performed according to the departmental dose-optimization program which includes automated exposure control, adjustment of the mA and/or kV according to patient size and/or use of iterative reconstruction technique. COMPARISON:  01/05/2021, 01/05/2008 FINDINGS: CT HEAD FINDINGS Brain: Mild parenchymal volume loss is present, stable since prior examination, commensurate with the patient's age. There is moderate ventriculomegaly again noted, discordant with the degree of parenchymal volume loss and progressive since remote prior examination of 01/05/2008. With this may represent asymmetric central atrophy and resultant ex vacuo dilation of the ventricles, communicating hydrocephalus could appear similarly. No evidence of acute intracranial hemorrhage or infarct. No abnormal mass effect or midline shift. No abnormal intra or extra-axial mass lesion or fluid collection. Cerebellum is unremarkable. Vascular: No hyperdense vessel or unexpected calcification. Skull: Normal. Negative for fracture or focal lesion. Sinuses/Orbits: No acute finding. Other: There is fluid opacification of several inferior left mastoid air cells without associated osseous erosion. Right mastoid air cells and middle ear cavities are clear. Small right parietal scalp soft tissue swelling noted. CT CERVICAL SPINE FINDINGS Alignment: There is reversal of the normal cervical lordosis at C3-C7 as well as 2 mm anterolisthesis C3-4, C4-5 and C7-T1 and 2 mm retrolisthesis C5-6, all stable since prior examination and likely degenerative in nature. Skull base and vertebrae: Craniocervical alignment is normal. The atlantodental interval is not widened. No acute fracture of the cervical spine. Vertebral body height is preserved. Ankylosis of the facet  joints of C3-C5, bilaterally. Soft tissues and spinal canal: Retrolisthesis and C5-6 contributes to mild central canal stenosis at this level. The  spinal canal is otherwise widely patent. No canal hematoma. No prevertebral soft tissue swelling or paraspinal fluid collections. Disc levels: There is intervertebral disc space narrowing and endplate remodeling throughout the cervical spine, most severe at C4-C6 in keeping with changes of advanced degenerative disc disease. Prevertebral soft tissues are not thickened on sagittal reformats. Multilevel uncovertebral and facet arthrosis results in multilevel mild-to-moderate neuroforaminal narrowing, most severe bilaterally at C5-6 Upper chest: Aneurysm of the thoracic aortic arch is partially visualized measuring at least 3.6 cm in diameter, incompletely evaluated on this examination. Biapical pulmonary infiltrates are identified, not well characterized on this examination but stable since prior examination possibly reflecting biapical scarring Other: None IMPRESSION: 1. No acute intracranial abnormality. No calvarial fracture. Small right parietal scalp soft tissue swelling. 2. Moderate ventriculomegaly, discordant with the degree of parenchymal volume loss, progressive since remote prior examination of 01/05/2008. While this may represent asymmetric central atrophy, communicating hydrocephalus could appear similarly. Correlation with neurologic examination is recommended. 3. No acute fracture or listhesis of the cervical spine. 4. Multilevel degenerative disc and degenerative joint disease resulting in mild central canal stenosis at C5-6 and multilevel neuroforaminal narrowing, most severe bilaterally at C5-6. 5. Aneurysm of the thoracic aortic arch, incompletely evaluated on this examination. Electronically Signed   By: Fidela Salisbury M.D.   On: 12/01/2021 01:44   CT Cervical Spine Wo Contrast  Result Date: 12/01/2021 CLINICAL DATA:  Head trauma, minor (Age >= 65y);  Neck trauma (Age >= 65y). Fall EXAM: CT HEAD WITHOUT CONTRAST CT CERVICAL SPINE WITHOUT CONTRAST TECHNIQUE: Multidetector CT imaging of the head and cervical spine was performed following the standard protocol without intravenous contrast. Multiplanar CT image reconstructions of the cervical spine were also generated. RADIATION DOSE REDUCTION: This exam was performed according to the departmental dose-optimization program which includes automated exposure control, adjustment of the mA and/or kV according to patient size and/or use of iterative reconstruction technique. COMPARISON:  01/05/2021, 01/05/2008 FINDINGS: CT HEAD FINDINGS Brain: Mild parenchymal volume loss is present, stable since prior examination, commensurate with the patient's age. There is moderate ventriculomegaly again noted, discordant with the degree of parenchymal volume loss and progressive since remote prior examination of 01/05/2008. With this may represent asymmetric central atrophy and resultant ex vacuo dilation of the ventricles, communicating hydrocephalus could appear similarly. No evidence of acute intracranial hemorrhage or infarct. No abnormal mass effect or midline shift. No abnormal intra or extra-axial mass lesion or fluid collection. Cerebellum is unremarkable. Vascular: No hyperdense vessel or unexpected calcification. Skull: Normal. Negative for fracture or focal lesion. Sinuses/Orbits: No acute finding. Other: There is fluid opacification of several inferior left mastoid air cells without associated osseous erosion. Right mastoid air cells and middle ear cavities are clear. Small right parietal scalp soft tissue swelling noted. CT CERVICAL SPINE FINDINGS Alignment: There is reversal of the normal cervical lordosis at C3-C7 as well as 2 mm anterolisthesis C3-4, C4-5 and C7-T1 and 2 mm retrolisthesis C5-6, all stable since prior examination and likely degenerative in nature. Skull base and vertebrae: Craniocervical alignment is  normal. The atlantodental interval is not widened. No acute fracture of the cervical spine. Vertebral body height is preserved. Ankylosis of the facet joints of C3-C5, bilaterally. Soft tissues and spinal canal: Retrolisthesis and C5-6 contributes to mild central canal stenosis at this level. The spinal canal is otherwise widely patent. No canal hematoma. No prevertebral soft tissue swelling or paraspinal fluid collections. Disc levels: There is intervertebral disc space narrowing and endplate remodeling throughout  the cervical spine, most severe at C4-C6 in keeping with changes of advanced degenerative disc disease. Prevertebral soft tissues are not thickened on sagittal reformats. Multilevel uncovertebral and facet arthrosis results in multilevel mild-to-moderate neuroforaminal narrowing, most severe bilaterally at C5-6 Upper chest: Aneurysm of the thoracic aortic arch is partially visualized measuring at least 3.6 cm in diameter, incompletely evaluated on this examination. Biapical pulmonary infiltrates are identified, not well characterized on this examination but stable since prior examination possibly reflecting biapical scarring Other: None IMPRESSION: 1. No acute intracranial abnormality. No calvarial fracture. Small right parietal scalp soft tissue swelling. 2. Moderate ventriculomegaly, discordant with the degree of parenchymal volume loss, progressive since remote prior examination of 01/05/2008. While this may represent asymmetric central atrophy, communicating hydrocephalus could appear similarly. Correlation with neurologic examination is recommended. 3. No acute fracture or listhesis of the cervical spine. 4. Multilevel degenerative disc and degenerative joint disease resulting in mild central canal stenosis at C5-6 and multilevel neuroforaminal narrowing, most severe bilaterally at C5-6. 5. Aneurysm of the thoracic aortic arch, incompletely evaluated on this examination. Electronically Signed   By:  Fidela Salisbury M.D.   On: 12/01/2021 01:44    Procedures Procedures    Medications Ordered in ED Medications  bacitracin ointment (has no administration in time range)    ED Course/ Medical Decision Making/ A&P                           Medical Decision Making 88 year old female presents after fall at her facility.  Hypertensive on intake, vital signs otherwise normal.  Cardiopulmonary exam is normal, abdominal exam is benign.  No evidence of deformities in the extremities, no bruising or crepitus to the chest, no bruising or tenderness palpation of the abdomen.  Patient ambulatory in the ED with GCS of 15.  20 laceration of the posterior parietal scalp, hemostatic at this time.  Will not require repair.  Amount and/or Complexity of Data Reviewed Labs: ordered.    Details: Leukocytosis in theCBC with baseline anemia with hemoglobin of 9.3.  BMP with mild hyponatremia of 133, near patient's baseline on sodium supplementation at home.  UA with small hemoglobin but otherwise unremarkable.  Urine culture pending. Radiology:     Details: C-spineCT head.  No cranial abnormality or traumatic injury of the C-spine.  Multilevel degenerative disc disease and C-spine.  Incompletely visualized thoracic aortic arch aneurysm, known to patient.  Risk OTC drugs.   Regarding patient's CT that identified thoracic aortic arch aneurysm, this is known to patient patient has not been experiencing any symptoms of shortness of breath, chest pain, or experiencing asymmetric pulses on exam.  When discussed with the patient, she states she does not want any further investigation into this today as she states it has "been there for years and I would not want anything done with it anyways".  Feel it is reasonable to forego any further investigation to her aneurysm today.  Given reassuring work-up in the ED as well as patient GCS 15 ambulatory in ED without difficulty no further work-up is warranted near this time.   Wound will not require repair.  Patient is safe to be discharged back to her facility.  Patient's legal guardian, her daughter [name] was notified over the phone and is agreeable to this plan.  Gwenlyn Perking and her daughter voiced understanding of her medical evaluation and treatment plan. Each of their questions answered to their expressed satisfaction.  Return precautions were given.  Patient is well-appearing, stable, and was discharged in good condition.  This chart was dictated using voice recognition software, Dragon. Despite the best efforts of this provider to proofread and correct errors, errors may still occur which can change documentation meaning.   Final Clinical Impression(s) / ED Diagnoses Final diagnoses:  Fall, initial encounter    Rx / DC Orders ED Discharge Orders     None         Aura Dials 12/01/21 0532    Fatima Blank, MD 12/01/21 [phone]

## 2021-12-01 NOTE — ED Notes (Signed)
Ptar called 

## 2021-12-01 NOTE — ED Triage Notes (Signed)
Pt from [employer], had a fall coming out of the bathroom, hit head on a dressor.  Denies LOC, Per EMS, "good amount of blood upon arrival, bleeding is controlled."  Pt states has been taking antibiotics for a UTI and "that's why I think I felt."  189/90 P 74 98% RA CBG 111

## 2021-12-02 DIAGNOSIS — R2681 Unsteadiness on feet: Secondary | ICD-10-CM | POA: Diagnosis not present

## 2021-12-02 DIAGNOSIS — Z9181 History of falling: Secondary | ICD-10-CM | POA: Diagnosis not present

## 2021-12-02 DIAGNOSIS — M6281 Muscle weakness (generalized): Secondary | ICD-10-CM | POA: Diagnosis not present

## 2021-12-02 DIAGNOSIS — R2689 Other abnormalities of gait and mobility: Secondary | ICD-10-CM | POA: Diagnosis not present

## 2021-12-03 LAB — URINE CULTURE: Culture: 20000 — AB

## 2021-12-04 ENCOUNTER — Telehealth (HOSPITAL_BASED_OUTPATIENT_CLINIC_OR_DEPARTMENT_OTHER): Payer: Self-pay | Admitting: Emergency Medicine

## 2021-12-04 NOTE — Telephone Encounter (Signed)
Post ED Visit - Positive Culture Follow-up  Culture report reviewed by antimicrobial stewardship pharmacist: [place] Team '[]'$  Elenor Quinones, Pharm.D. '[]'$  Heide Guile, Pharm.D., BCPS AQ-ID '[]'$  Parks Neptune, Pharm.D., BCPS '[]'$  Alycia Rossetti, Pharm.D., BCPS '[]'$  Reader, Pharm.D., BCPS, AAHIVP '[]'$  Legrand Como, Pharm.D., BCPS, AAHIVP '[]'$  Salome Arnt, PharmD, BCPS '[]'$  Johnnette Gourd, PharmD, BCPS '[]'$  Hughes Better, PharmD, BCPS '[x]'$  Luisa Hart, PharmD '[]'$  Laqueta Linden, PharmD, BCPS '[]'$  Albertina Parr, PharmD  [place] Team '[]'$  Leodis Sias, PharmD '[]'$  Lindell Spar, PharmD '[]'$  Royetta Asal, PharmD '[]'$  Graylin Shiver, Rph '[]'$  Rema Fendt) Glennon Mac, PharmD '[]'$  Arlyn Dunning, PharmD '[]'$  Netta Cedars, PharmD '[]'$  Dia Sitter, PharmD '[]'$  Leone Haven, PharmD '[]'$  Gretta Arab, PharmD '[]'$  Theodis Shove, PharmD '[]'$  Peggyann Juba, PharmD '[]'$  Reuel Boom, PharmD   Positive urine culture No further patient follow-up is required at this time.  Sandi Raveling Khaleed Holan 12/04/2021, 3:01 PM

## 2021-12-04 NOTE — Progress Notes (Signed)
ED Antimicrobial Stewardship Positive Culture Follow Up   [name] is an 88 y.o. female who presented to [facility] on 12/01/2021 with a chief complaint of  Chief Complaint  Patient presents with   Fall    Recent Results (from the past 64 hour(s))  Urine Culture     Status: Abnormal   Collection Time: 12/01/21  1:11 AM   Specimen: Urine, Clean Catch  Result Value Ref Range Status   Specimen Description URINE, CLEAN CATCH  Final   Special Requests   Final    NONE Performed at [facility], [address], [place], [place] [address]    Culture 20,000 COLONIES/mL KLEBSIELLA PNEUMONIAE (A)  Final   Report Status 12/03/2021 FINAL  Final   Organism ID, Bacteria KLEBSIELLA PNEUMONIAE (A)  Final      Susceptibility   Klebsiella pneumoniae - MIC*    AMPICILLIN >=32 RESISTANT Resistant     CEFAZOLIN <=4 SENSITIVE Sensitive     CEFEPIME <=0.12 SENSITIVE Sensitive     CEFTRIAXONE <=0.25 SENSITIVE Sensitive     CIPROFLOXACIN <=0.25 SENSITIVE Sensitive     GENTAMICIN <=1 SENSITIVE Sensitive     IMIPENEM <=0.25 SENSITIVE Sensitive     NITROFURANTOIN 64 INTERMEDIATE Intermediate     TRIMETH/SULFA <=20 SENSITIVE Sensitive     AMPICILLIN/SULBACTAM 4 SENSITIVE Sensitive     PIP/TAZO <=4 SENSITIVE Sensitive     * 20,000 COLONIES/mL KLEBSIELLA PNEUMONIAE    Patient discharged originally without antimicrobial agent because patient was treated prior to admission with fosfomycin x 3 doses. No further treatment is needed. Urinalysis and urine culture indicative of urinary tract infection resolving as patient presented without any urinary symptoms.  New antibiotic prescription: None.   ED Provider: Alfredo Batty. Quentin Cornwall, PA-C  Kaleen Mask, PharmD, BCPS 12/04/2021, 10:26 AM Clinical Pharmacist Monday - Friday phone -  [phone] Saturday - Sunday phone - [phone] ur

## 2021-12-09 ENCOUNTER — Ambulatory Visit: Payer: Medicare Other

## 2021-12-09 ENCOUNTER — Ambulatory Visit: Payer: Medicare Other | Admitting: Family Medicine

## 2021-12-10 DIAGNOSIS — Z23 Encounter for immunization: Secondary | ICD-10-CM | POA: Diagnosis not present

## 2021-12-14 ENCOUNTER — Ambulatory Visit: Payer: Medicare Other

## 2021-12-14 DIAGNOSIS — I10 Essential (primary) hypertension: Secondary | ICD-10-CM | POA: Diagnosis not present

## 2021-12-14 DIAGNOSIS — M9903 Segmental and somatic dysfunction of lumbar region: Secondary | ICD-10-CM | POA: Diagnosis not present

## 2021-12-14 DIAGNOSIS — M4726 Other spondylosis with radiculopathy, lumbar region: Secondary | ICD-10-CM | POA: Diagnosis not present

## 2021-12-14 DIAGNOSIS — M9901 Segmental and somatic dysfunction of cervical region: Secondary | ICD-10-CM | POA: Diagnosis not present

## 2021-12-14 DIAGNOSIS — M9902 Segmental and somatic dysfunction of thoracic region: Secondary | ICD-10-CM | POA: Diagnosis not present

## 2021-12-14 DIAGNOSIS — L03119 Cellulitis of unspecified part of limb: Secondary | ICD-10-CM | POA: Diagnosis not present

## 2021-12-15 DIAGNOSIS — R2689 Other abnormalities of gait and mobility: Secondary | ICD-10-CM | POA: Diagnosis not present

## 2021-12-15 DIAGNOSIS — R2681 Unsteadiness on feet: Secondary | ICD-10-CM | POA: Diagnosis not present

## 2021-12-15 DIAGNOSIS — Z9181 History of falling: Secondary | ICD-10-CM | POA: Diagnosis not present

## 2021-12-15 DIAGNOSIS — M6281 Muscle weakness (generalized): Secondary | ICD-10-CM | POA: Diagnosis not present

## 2021-12-21 DIAGNOSIS — M9901 Segmental and somatic dysfunction of cervical region: Secondary | ICD-10-CM | POA: Diagnosis not present

## 2021-12-21 DIAGNOSIS — M4726 Other spondylosis with radiculopathy, lumbar region: Secondary | ICD-10-CM | POA: Diagnosis not present

## 2021-12-21 DIAGNOSIS — M9902 Segmental and somatic dysfunction of thoracic region: Secondary | ICD-10-CM | POA: Diagnosis not present

## 2021-12-21 DIAGNOSIS — M9903 Segmental and somatic dysfunction of lumbar region: Secondary | ICD-10-CM | POA: Diagnosis not present

## 2021-12-24 DIAGNOSIS — L299 Pruritus, unspecified: Secondary | ICD-10-CM | POA: Diagnosis not present

## 2021-12-24 DIAGNOSIS — M7989 Other specified soft tissue disorders: Secondary | ICD-10-CM | POA: Diagnosis not present

## 2022-01-04 DIAGNOSIS — M1009 Idiopathic gout, multiple sites: Secondary | ICD-10-CM | POA: Diagnosis not present

## 2022-01-04 DIAGNOSIS — R768 Other specified abnormal immunological findings in serum: Secondary | ICD-10-CM | POA: Diagnosis not present

## 2022-01-04 DIAGNOSIS — M064 Inflammatory polyarthropathy: Secondary | ICD-10-CM | POA: Diagnosis not present

## 2022-01-04 DIAGNOSIS — L409 Psoriasis, unspecified: Secondary | ICD-10-CM | POA: Diagnosis not present

## 2022-01-04 DIAGNOSIS — Z6825 Body mass index (BMI) 25.0-25.9, adult: Secondary | ICD-10-CM | POA: Diagnosis not present

## 2022-01-04 DIAGNOSIS — M1991 Primary osteoarthritis, unspecified site: Secondary | ICD-10-CM | POA: Diagnosis not present

## 2022-01-04 DIAGNOSIS — E663 Overweight: Secondary | ICD-10-CM | POA: Diagnosis not present

## 2022-01-05 DIAGNOSIS — R2689 Other abnormalities of gait and mobility: Secondary | ICD-10-CM | POA: Diagnosis not present

## 2022-01-05 DIAGNOSIS — M6281 Muscle weakness (generalized): Secondary | ICD-10-CM | POA: Diagnosis not present

## 2022-01-05 DIAGNOSIS — Z9181 History of falling: Secondary | ICD-10-CM | POA: Diagnosis not present

## 2022-01-05 DIAGNOSIS — R2681 Unsteadiness on feet: Secondary | ICD-10-CM | POA: Diagnosis not present

## 2022-01-07 DIAGNOSIS — Z9181 History of falling: Secondary | ICD-10-CM | POA: Diagnosis not present

## 2022-01-07 DIAGNOSIS — M6281 Muscle weakness (generalized): Secondary | ICD-10-CM | POA: Diagnosis not present

## 2022-01-07 DIAGNOSIS — R2689 Other abnormalities of gait and mobility: Secondary | ICD-10-CM | POA: Diagnosis not present

## 2022-01-07 DIAGNOSIS — R2681 Unsteadiness on feet: Secondary | ICD-10-CM | POA: Diagnosis not present

## 2022-01-11 DIAGNOSIS — L299 Pruritus, unspecified: Secondary | ICD-10-CM | POA: Diagnosis not present

## 2022-01-11 DIAGNOSIS — Z85828 Personal history of other malignant neoplasm of skin: Secondary | ICD-10-CM | POA: Diagnosis not present

## 2022-01-11 DIAGNOSIS — L309 Dermatitis, unspecified: Secondary | ICD-10-CM | POA: Diagnosis not present

## 2022-01-12 ENCOUNTER — Ambulatory Visit: Payer: Medicare Other

## 2022-01-12 DIAGNOSIS — R2681 Unsteadiness on feet: Secondary | ICD-10-CM | POA: Diagnosis not present

## 2022-01-12 DIAGNOSIS — Z9181 History of falling: Secondary | ICD-10-CM | POA: Diagnosis not present

## 2022-01-12 DIAGNOSIS — M6281 Muscle weakness (generalized): Secondary | ICD-10-CM | POA: Diagnosis not present

## 2022-01-12 DIAGNOSIS — R2689 Other abnormalities of gait and mobility: Secondary | ICD-10-CM | POA: Diagnosis not present

## 2022-01-13 ENCOUNTER — Ambulatory Visit (INDEPENDENT_AMBULATORY_CARE_PROVIDER_SITE_OTHER): Payer: Medicare Other | Admitting: Podiatry

## 2022-01-13 ENCOUNTER — Encounter: Payer: Self-pay | Admitting: Podiatry

## 2022-01-13 DIAGNOSIS — L84 Corns and callosities: Secondary | ICD-10-CM | POA: Diagnosis not present

## 2022-01-13 DIAGNOSIS — B351 Tinea unguium: Secondary | ICD-10-CM

## 2022-01-13 DIAGNOSIS — L608 Other nail disorders: Secondary | ICD-10-CM | POA: Diagnosis not present

## 2022-01-13 DIAGNOSIS — M79674 Pain in right toe(s): Secondary | ICD-10-CM | POA: Diagnosis not present

## 2022-01-13 DIAGNOSIS — M2031 Hallux varus (acquired), right foot: Secondary | ICD-10-CM | POA: Diagnosis not present

## 2022-01-13 DIAGNOSIS — M79675 Pain in left toe(s): Secondary | ICD-10-CM | POA: Diagnosis not present

## 2022-01-13 NOTE — Progress Notes (Addendum)
This patient returns to the office for evaluation and treatment of long thick painful nails .  This patient is unable to trim his own nails since the patient cannot reach his feet.  Patient says the nails are painful walking and wearing his shoes.   She has painful callus on the bottom of her right big toe. She returns for preventive foot care services.  General Appearance  Alert, conversant and in no acute stress.  Vascular  Dorsalis pedis and posterior tibial  pulses are palpable  bilaterally.  Capillary return is within normal limits  bilaterally. Temperature is within normal limits  bilaterally.  Neurologic  Senn-Weinstein monofilament wire test within normal limits  bilaterally. Muscle power within normal limits bilaterally.  Nails Thick disfigured discolored nails with subungual debris  from hallux to fifth toes bilaterally. No evidence of bacterial infection or drainage bilaterally. Pincer fourth toenails  B/l.  Orthopedic  No limitations of motion  feet .  No crepitus or effusions noted.  No bony pathology or digital deformities noted. 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  Skin  normotropic  bilaterally.  No signs of infections or ulcers noted.     symptomatic callus right hallux.  Clavi second toe left foot.  Onychomycosis  Pain in toes right foot  Pain in toes left foot  Clavi second toe left foot.  Debridement  of nails  1-5  B/L with a nail nipper.  Nails were then filed using a dremel tool with no incidents.   Debride clavi with #15 blade.   RTC 9 weeks     Gardiner Barefoot DPM   Patient evaluated by myself, Dr. Amalia Hailey, for bilateral lower extremity edema with leg swelling.  This has been ongoing for about 2 weeks now according to the patient.  She has never had swelling in her legs before.  Idiopathic onset.  She denies any change in medication.  Calves are soft and supple.  Clinically there is no concern for DVT.  Skin is warm to touch.  After evaluation recommend that she  follows up closely with her PCP for possible diuretics and for review of systems.    Edrick Kins, DPM [facility]  Dr. Edrick Kins, DPM    [address], [place] [address]                Office [phone]  Fax [phone]

## 2022-01-14 DIAGNOSIS — L03115 Cellulitis of right lower limb: Secondary | ICD-10-CM | POA: Diagnosis not present

## 2022-01-14 DIAGNOSIS — R030 Elevated blood-pressure reading, without diagnosis of hypertension: Secondary | ICD-10-CM | POA: Diagnosis not present

## 2022-01-14 DIAGNOSIS — R262 Difficulty in walking, not elsewhere classified: Secondary | ICD-10-CM | POA: Diagnosis not present

## 2022-01-18 ENCOUNTER — Ambulatory Visit: Payer: Medicare Other

## 2022-01-18 DIAGNOSIS — M6281 Muscle weakness (generalized): Secondary | ICD-10-CM | POA: Diagnosis not present

## 2022-01-18 DIAGNOSIS — M9901 Segmental and somatic dysfunction of cervical region: Secondary | ICD-10-CM | POA: Diagnosis not present

## 2022-01-18 DIAGNOSIS — Z9181 History of falling: Secondary | ICD-10-CM | POA: Diagnosis not present

## 2022-01-18 DIAGNOSIS — M9903 Segmental and somatic dysfunction of lumbar region: Secondary | ICD-10-CM | POA: Diagnosis not present

## 2022-01-18 DIAGNOSIS — R2689 Other abnormalities of gait and mobility: Secondary | ICD-10-CM | POA: Diagnosis not present

## 2022-01-18 DIAGNOSIS — R2681 Unsteadiness on feet: Secondary | ICD-10-CM | POA: Diagnosis not present

## 2022-01-18 DIAGNOSIS — M4726 Other spondylosis with radiculopathy, lumbar region: Secondary | ICD-10-CM | POA: Diagnosis not present

## 2022-01-18 DIAGNOSIS — M9902 Segmental and somatic dysfunction of thoracic region: Secondary | ICD-10-CM | POA: Diagnosis not present

## 2022-01-20 DIAGNOSIS — R2689 Other abnormalities of gait and mobility: Secondary | ICD-10-CM | POA: Diagnosis not present

## 2022-01-20 DIAGNOSIS — Z9181 History of falling: Secondary | ICD-10-CM | POA: Diagnosis not present

## 2022-01-20 DIAGNOSIS — R2681 Unsteadiness on feet: Secondary | ICD-10-CM | POA: Diagnosis not present

## 2022-01-20 DIAGNOSIS — M6281 Muscle weakness (generalized): Secondary | ICD-10-CM | POA: Diagnosis not present

## 2022-01-25 DIAGNOSIS — M7989 Other specified soft tissue disorders: Secondary | ICD-10-CM | POA: Diagnosis not present

## 2022-01-25 DIAGNOSIS — R5383 Other fatigue: Secondary | ICD-10-CM | POA: Diagnosis not present

## 2022-01-26 DIAGNOSIS — R2681 Unsteadiness on feet: Secondary | ICD-10-CM | POA: Diagnosis not present

## 2022-01-26 DIAGNOSIS — Z9181 History of falling: Secondary | ICD-10-CM | POA: Diagnosis not present

## 2022-01-26 DIAGNOSIS — M6281 Muscle weakness (generalized): Secondary | ICD-10-CM | POA: Diagnosis not present

## 2022-01-26 DIAGNOSIS — R2689 Other abnormalities of gait and mobility: Secondary | ICD-10-CM | POA: Diagnosis not present

## 2022-01-28 DIAGNOSIS — R2689 Other abnormalities of gait and mobility: Secondary | ICD-10-CM | POA: Diagnosis not present

## 2022-01-28 DIAGNOSIS — R2681 Unsteadiness on feet: Secondary | ICD-10-CM | POA: Diagnosis not present

## 2022-01-28 DIAGNOSIS — Z9181 History of falling: Secondary | ICD-10-CM | POA: Diagnosis not present

## 2022-01-28 DIAGNOSIS — M6281 Muscle weakness (generalized): Secondary | ICD-10-CM | POA: Diagnosis not present

## 2022-01-29 ENCOUNTER — Other Ambulatory Visit: Payer: Self-pay

## 2022-01-29 DIAGNOSIS — D649 Anemia, unspecified: Secondary | ICD-10-CM

## 2022-01-31 DIAGNOSIS — M6281 Muscle weakness (generalized): Secondary | ICD-10-CM | POA: Diagnosis not present

## 2022-01-31 DIAGNOSIS — R2681 Unsteadiness on feet: Secondary | ICD-10-CM | POA: Diagnosis not present

## 2022-01-31 DIAGNOSIS — R2689 Other abnormalities of gait and mobility: Secondary | ICD-10-CM | POA: Diagnosis not present

## 2022-01-31 DIAGNOSIS — Z9181 History of falling: Secondary | ICD-10-CM | POA: Diagnosis not present

## 2022-02-01 ENCOUNTER — Inpatient Hospital Stay: Payer: Medicare Other | Attending: Hematology

## 2022-02-01 ENCOUNTER — Inpatient Hospital Stay (HOSPITAL_BASED_OUTPATIENT_CLINIC_OR_DEPARTMENT_OTHER): Payer: Medicare Other | Admitting: Hematology

## 2022-02-01 ENCOUNTER — Other Ambulatory Visit: Payer: Self-pay

## 2022-02-01 VITALS — BP 175/78 | HR 72 | Temp 97.6°F | Resp 18 | Wt 147.2 lb

## 2022-02-01 DIAGNOSIS — D649 Anemia, unspecified: Secondary | ICD-10-CM | POA: Diagnosis not present

## 2022-02-01 DIAGNOSIS — Z7982 Long term (current) use of aspirin: Secondary | ICD-10-CM | POA: Diagnosis not present

## 2022-02-01 DIAGNOSIS — Z87891 Personal history of nicotine dependence: Secondary | ICD-10-CM | POA: Diagnosis not present

## 2022-02-01 DIAGNOSIS — Z79899 Other long term (current) drug therapy: Secondary | ICD-10-CM | POA: Diagnosis not present

## 2022-02-01 DIAGNOSIS — N182 Chronic kidney disease, stage 2 (mild): Secondary | ICD-10-CM | POA: Insufficient documentation

## 2022-02-01 DIAGNOSIS — E538 Deficiency of other specified B group vitamins: Secondary | ICD-10-CM

## 2022-02-01 DIAGNOSIS — M069 Rheumatoid arthritis, unspecified: Secondary | ICD-10-CM | POA: Diagnosis not present

## 2022-02-01 LAB — CMP (CANCER CENTER ONLY)
ALT: 20 U/L (ref 0–44)
AST: 28 U/L (ref 15–41)
Albumin: 4 g/dL (ref 3.5–5.0)
Alkaline Phosphatase: 71 U/L (ref 38–126)
Anion gap: 8 (ref 5–15)
BUN: 40 mg/dL — ABNORMAL HIGH (ref 8–23)
CO2: 24 mmol/L (ref 22–32)
Calcium: 9.3 mg/dL (ref 8.9–10.3)
Chloride: 100 mmol/L (ref 98–111)
Creatinine: 1.02 mg/dL — ABNORMAL HIGH (ref 0.44–1.00)
GFR, Estimated: 52 mL/min — ABNORMAL LOW (ref >60)
Glucose, Bld: 94 mg/dL (ref 70–99)
Potassium: 4.9 mmol/L (ref 3.5–5.1)
Sodium: 132 mmol/L — ABNORMAL LOW (ref 135–145)
Total Bilirubin: 0.4 mg/dL (ref 0.3–1.2)
Total Protein: 8 g/dL (ref 6.5–8.1)

## 2022-02-01 LAB — CBC WITH DIFFERENTIAL/PLATELET
Abs Immature Granulocytes: 0.06 10*3/uL (ref 0.00–0.07)
Basophils Absolute: 0.2 10*3/uL — ABNORMAL HIGH (ref 0.0–0.1)
Basophils Relative: 2 %
Eosinophils Absolute: 0.6 10*3/uL — ABNORMAL HIGH (ref 0.0–0.5)
Eosinophils Relative: 7 %
HCT: 29 % — ABNORMAL LOW (ref 36.0–46.0)
Hemoglobin: 9.4 g/dL — ABNORMAL LOW (ref 12.0–15.0)
Immature Granulocytes: 1 %
Lymphocytes Relative: 19 %
Lymphs Abs: 1.7 10*3/uL (ref 0.7–4.0)
MCH: 28.5 pg (ref 26.0–34.0)
MCHC: 32.4 g/dL (ref 30.0–36.0)
MCV: 87.9 fL (ref 80.0–100.0)
Monocytes Absolute: 0.8 10*3/uL (ref 0.1–1.0)
Monocytes Relative: 10 %
Neutro Abs: 5.3 10*3/uL (ref 1.7–7.7)
Neutrophils Relative %: 61 %
Platelets: 314 10*3/uL (ref 150–400)
RBC: 3.3 MIL/uL — ABNORMAL LOW (ref 3.87–5.11)
RDW: 15.4 % (ref 11.5–15.5)
WBC: 8.6 10*3/uL (ref 4.0–10.5)
nRBC: 0 % (ref 0.0–0.2)

## 2022-02-01 LAB — IRON AND IRON BINDING CAPACITY (CC-WL,HP ONLY)
Iron: 54 ug/dL (ref 28–170)
Saturation Ratios: 19 % (ref 10.4–31.8)
TIBC: 293 ug/dL (ref 250–450)
UIBC: 239 ug/dL (ref 148–442)

## 2022-02-01 LAB — FERRITIN: Ferritin: 212 ng/mL (ref 11–307)

## 2022-02-01 LAB — TYPE AND SCREEN
ABO/RH(D): AB POS
Antibody Screen: NEGATIVE

## 2022-02-01 LAB — VITAMIN B12: Vitamin B-12: 668 pg/mL (ref 180–914)

## 2022-02-01 NOTE — Progress Notes (Signed)
HEMATOLOGY/ONCOLOGY CLINIC NOTE  Date of Service: 02/01/2022   Patient Care Team: Wardell Honour, MD as PCP - General (Family Medicine) Belva Crome, MD as PCP - Cardiology (Cardiology)  CHIEF COMPLAINTS/PURPOSE OF CONSULTATION:  Continued management of normocytic anemia.  HISTORY OF PRESENTING ILLNESS:  Please see previous notes for details on initial presentation  INTERVAL HISTORY: [name] is a 88 y.o. female here for follow-up of her normocytic anemia.  Patient was last seen by me on 08/05/2021 and complained of low energy, left leg swelling, and itchy rashes on left anterior arm.   She reports she is doing well without any new medical concerns since our last visit. She denies fever, chills, back pain, abdominal pain, new lumps/bumps, and leg swelling.  She is complaint with all of her medications. She is currently taking Ferrous Sulfate 325 mg,po daily and B-Complex and vitamin supplements.   MEDICAL HISTORY:  Past Medical History:  Diagnosis Date   Anemia    CHRONIC   Aortic insufficiency    a. 2D echo 08/29/15: EF 55-60%, mild LVH, diastolic dysfunction, elevated LV filling pressure, mild AI, severe LAE, mild RAE, mild TR, dilated descending thoracic aorta just distal to the takeoff of the left subclavian, measuring 4.1 cm, no pericardial effusion.   Arthritis    OA-SOME BACK AND NECK PAIN,  GOES TO CHIROPRACTOR TWICE A MONTH;  HX OF JOINT REPLACEMENTS    Cancer ([place])    SKIN CANCERS REMOVED FROM LEGS   CKD (chronic kidney disease), stage II    Depression    Essential hypertension    Gait abnormality 09/08/2017   GERD (gastroesophageal reflux disease)    Low sodium levels    Myocarditis (HCC)    PSVT (paroxysmal supraventricular tachycardia)    Recurrent Pericarditis    RECURRENT    Thoracic aneurysm    a. Followed by [facility] -last seen in 12/2013 w/ plan for f/u MRA and thoracic surgery f/u in 2 yrs, but patient does not wish to follow any longer.     SURGICAL HISTORY: Past Surgical History:  Procedure Laterality Date   APPENDECTOMY     BREAST EXCISIONAL BIOPSY Left 1992   benign   EXCISION/RELEASE BURSA HIP Left 11/24/2012   Procedure: LEFT HIP BURSECTOMY AND TENDON REPAIR ;  Surgeon: Gearlean Alf, MD;  Location: [facility];  Service: Orthopedics;  Laterality: Left;   HIP CLOSED REDUCTION Left 12/19/2012   Procedure: CLOSED MANIPULATION HIP;  Surgeon: Mauri Pole, MD;  Location: [facility];  Service: Orthopedics;  Laterality: Left;   HIP CLOSED REDUCTION Left 12/05/2013   Procedure: CLOSED MANIPULATION HIP;  Surgeon: Augustin Schooling, MD;  Location: [facility];  Service: Orthopedics;  Laterality: Left;   HIP CLOSED REDUCTION Left 09/22/2015   Procedure: CLOSED REDUCTION HIP;  Surgeon: Melina Schools, MD;  Location: [facility];  Service: Orthopedics;  Laterality: Left;   JOINT REPLACEMENT     LEFT TOTAL HIP REPLACEMENT AND REVISIONS X 2   OOPHORECTOMY     has a partial of one ovary remaingin   PELVIC LAPAROSCOPY     ovarian cyst removal,    REVISION TOTAL HIP ARTHROPLASTY     left   right Achilles Tendon repair  5/13   TOTAL KNEE ARTHROPLASTY     right   TOTAL KNEE ARTHROPLASTY     left   VAGINAL HYSTERECTOMY     with ovarian cyst removal    SOCIAL HISTORY: Social History   Socioeconomic  History   Marital status: Widowed    Spouse name: Not on file   Number of children: Not on file   Years of education: Not on file   Highest education level: Not on file  Occupational History   Not on file  Tobacco Use   Smoking status: Former    Years: 5.00    Types: Cigarettes    Quit date: 13    Years since quitting: 58.9   Smokeless tobacco: Never   Tobacco comments:    quit age 80  Vaping Use   Vaping Use: Never used  Substance and Sexual Activity   Alcohol use: No    Alcohol/week: 0.0 standard drinks of alcohol   Drug use: Not Currently   Sexual activity: Not Currently    Birth control/protection: Surgical    Comment:  HYST-1st intercourse 88 yo-Fewer than 5 partners  Other Topics Concern   Not on file  Social History Narrative   Diet: very good      Caffeine: 1 1/2 cup coffee (usually decaf)      Married, if yes what year: Widow, 1954      Do you live in a house, apartment, assisted living, condo, trailer, ect: [place], independent living      Is it one or more stories: 2nd level. elevate      How many persons live in your home? 1      Pets: No      Highest level or education completed: 14      Current/Past profession: Actuary      Exercise:  Yes                Type and how often: aerobics twice daily         Living Will: Yes   DNR: Yes   POA/HPOA: Yes      Functional Status:   Do you have difficulty bathing or dressing yourself? No   Do you have difficulty preparing food or eating? No   Do you have difficulty managing your medications? No   Do you have difficulty managing your finances? No   Do you have difficulty affording your medications? No   Social Determinants of Radio broadcast assistant Strain: Not on file  Food Insecurity: Not on file  Transportation Needs: Not on file  Physical Activity: Not on file  Stress: Not on file  Social Connections: Not on file  Intimate Partner Violence: Not on file    FAMILY HISTORY: Family History  Problem Relation Age of Onset   Hypertension Mother    Stroke Mother    Heart attack Father 91   Heart failure Father    Heart failure Sister    Aortic aneurysm Sister    Aortic aneurysm Sister    Stroke Sister    Pneumonia Brother    Stroke Brother    ALS Other    Hyperlipidemia Daughter    Aortic aneurysm Son    Breast cancer Neg Hx     ALLERGIES:  is allergic to cephalexin, shellfish allergy, shellfish-derived products, clindamycin/lincomycin, lincomycin, other, penicillamine, sugar-protein-starch, tape, ciprofloxacin, sulfa antibiotics, and sulfites.  MEDICATIONS:  Current Outpatient Medications   Medication Sig Dispense Refill   Acetaminophen (ACETAMINOPHEN EXTRA STRENGTH) 500 MG capsule 1 capsule as needed     allopurinol (ZYLOPRIM) 300 MG tablet Take 300 mg by mouth daily.     amLODipine (NORVASC) 5 MG tablet Take 1 tablet (5 mg total) by mouth daily. 90 tablet  3   aspirin 81 MG tablet Take 81 mg by mouth every evening.     B Complex Vitamins (VITAMIN B COMPLEX) TABS 1 tablet     betamethasone dipropionate 0.05 % cream Apply topically 2 (two) times daily. Applied to the right ear canal twice daily for 4 to 5 days or until itching resolves. 30 g 0   Calcium Carbonate-Vit D-Min (CALCIUM 1200 PO) Take 1 tablet by mouth every evening.     carboxymethylcellulose (REFRESH PLUS) 0.5 % SOLN Place 1 drop into both eyes in the morning and at bedtime.     carvedilol (COREG) 12.5 MG tablet Take 1 tablet (12.5 mg total) by mouth 2 (two) times daily with a meal. 180 tablet 3   cholecalciferol (VITAMIN D) 1000 UNITS tablet Take 1,000 Units by mouth daily. D3     Colchicine 0.6 MG CAPS 1 capsule     diltiazem (CARDIZEM CD) 240 MG 24 hr capsule Take 1 capsule (240 mg total) by mouth daily. 90 capsule 3   estradiol (VIVELLE-DOT) 0.025 MG/24HR Place 1 patch onto the skin 2 (two) times a week. 24 patch 3   ferrous sulfate 325 (65 FE) MG tablet Take 325 mg by mouth daily. 2 tablets daily     Multiple Vitamins-Minerals (SYSTANE ICAPS AREDS2 PO) Take by mouth.     mupirocin ointment (BACTROBAN) 2 % Apply 1 application topically See admin instructions. Apply to leg after showers     omega-3 acid ethyl esters (LOVAZA) 1 g capsule Take 1 g by mouth daily.      predniSONE (STERAPRED UNI-PAK 48 TAB) 5 MG (48) TBPK tablet See admin instructions.     sodium chloride 1 g tablet Take 1 g by mouth daily.     tretinoin (RETIN-A) 0.1 % cream Apply 1 application topically at bedtime as needed (breakouts).     No current facility-administered medications for this visit.    REVIEW OF SYSTEMS:   .10 Point review of  Systems was done is negative except as noted above.  PHYSICAL EXAMINATION: ECOG PERFORMANCE STATUS: 0 - Asymptomatic  .BP (!) 175/78 Comment: nurse notified  Pulse 72   Temp 97.6 F (36.4 C)   Resp 18   Wt 147 lb 3.2 oz (66.8 kg)   SpO2 96%   BMI 26.28 kg/m   Exam was given in a chair.  NAD GENERAL:alert, in no acute distress and comfortable SKIN: no acute rashes, no significant lesions EYES: conjunctiva are pink and non-injected, sclera anicteric NECK: supple, no JVD LYMPH:  no palpable lymphadenopathy in the cervical, axillary or inguinal regions LUNGS: clear to auscultation b/l with normal respiratory effort HEART: regular rate & rhythm ABDOMEN:  normoactive bowel sounds , non tender, not distended. Extremity: no pedal edema PSYCH: alert & oriented x 3 with fluent speech NEURO: no focal motor/sensory deficits  LABORATORY DATA:   I have reviewed the data as listed  .    Latest Ref Rng & Units 02/01/2022   12:20 PM 12/01/2021   12:31 AM 08/05/2021   10:39 AM  CBC  WBC 4.0 - 10.5 K/uL 8.6  8.3  9.2   Hemoglobin 12.0 - 15.0 g/dL 9.4  9.3  9.7   Hematocrit 36.0 - 46.0 % 29.0  29.7  29.8   Platelets 150 - 400 K/uL 314  322  238    . CBC    Component Value Date/Time   WBC 8.6 02/01/2022 1220   RBC 3.30 (L) 02/01/2022 1220   HGB  9.4 (L) 02/01/2022 1220   HGB 8.7 (L) 02/17/2021 1001   HGB 10.8 (L) 12/26/2007 1539   HCT 29.0 (L) 02/01/2022 1220   HCT 28.2 (L) 02/17/2021 1001   HCT 33.4 (L) 12/26/2007 1539   PLT 314 02/01/2022 1220   PLT 278 02/17/2021 1001   PLT 314 12/26/2007 1539   MCV 87.9 02/01/2022 1220   MCV 88.5 12/26/2007 1539   MCH 28.5 02/01/2022 1220   MCHC 32.4 02/01/2022 1220   RDW 15.4 02/01/2022 1220   RDW 15.3 (H) 12/26/2007 1539   LYMPHSABS 1.7 02/01/2022 1220   LYMPHSABS 2.2 12/26/2007 1539   MONOABS 0.8 02/01/2022 1220   MONOABS 0.8 12/26/2007 1539   EOSABS 0.6 (H) 02/01/2022 1220   EOSABS 0.6 (H) 12/26/2007 1539   BASOSABS 0.2 (H)  02/01/2022 1220   BASOSABS 0.1 12/26/2007 1539     .    Latest Ref Rng & Units 02/01/2022   12:20 PM 12/01/2021   12:31 AM 08/05/2021   10:39 AM  CMP  Glucose 70 - 99 mg/dL 94  104  100   BUN 8 - 23 mg/dL 40  25  33   Creatinine 0.44 - 1.00 mg/dL 1.02  0.98  1.10   Sodium 135 - 145 mmol/L 132  133  135   Potassium 3.5 - 5.1 mmol/L 4.9  4.6  4.7   Chloride 98 - 111 mmol/L 100  102  102   CO2 22 - 32 mmol/L '24  21  27   '$ Calcium 8.9 - 10.3 mg/dL 9.3  9.3  9.3   Total Protein 6.5 - 8.1 g/dL 8.0   7.7   Total Bilirubin 0.3 - 1.2 mg/dL 0.4   0.5   Alkaline Phos 38 - 126 U/L 71   82   AST 15 - 41 U/L 28   28   ALT 0 - 44 U/L 20   19    Component     Latest Ref Rng & Units 03/06/2020  Iron     41 - 142 ug/dL 35 (L)  TIBC     236 - 444 ug/dL 448 (H)  Saturation Ratios     21 - 57 % 8 (L)  UIBC     120 - 384 ug/dL 413 (H)  Folate, Hemolysate     Not Estab. ng/mL 605.0  HCT     34.0 - 46.6 % 29.6 (L)  Folate, RBC     >498 ng/mL 2,044  Erythropoietin     2.6 - 18.5 mIU/mL 14.9  RA Latex Turbid.     <14.0 IU/mL 99.5 (H)  Vitamin B12     180 - 914 pg/mL 841  Ferritin     11 - 307 ng/mL 35  Sed Rate     0 - 22 mm/hr 22   . Lab Results  Component Value Date   IRON 54 02/01/2022   TIBC 293 02/01/2022   IRONPCTSAT 19 02/01/2022   (Iron and TIBC)  Lab Results  Component Value Date   FERRITIN 212 02/01/2022     RADIOGRAPHIC STUDIES: I have personally reviewed the radiological images as listed and agreed with the findings in the report. No results found.  ASSESSMENT & PLAN:   88 yo with   1) Chronic normocytic anemia Due to CKD + iron deficiency + anemia of chronic inflammation from possible RA 2) RA -not currently on treatment  PLAN: -Labs today were reviewed in detail with the patient. CBC showed WBC of  8.6 K, hemoglobin of 9.4 K, and platelets of 314 K. Ferritin 361 with an iron saturation of 24% -no indication for additional IV Iron at this  time. -Optimization of rheumatoid arthritis treatment with primary care physician/rheumatology.  -Continue vitamin B complex 1 capsule p.o. daily.  FOLLOW UP: RTC with Dr Irene Limbo with labs in 6 months  The total time spent in the appointment was 20 minutes* .  All of the patient's questions were answered with apparent satisfaction. The patient knows to call the clinic with any problems, questions or concerns.   Sullivan Lone MD MS AAHIVMS [facility] Hematology/Oncology Physician [facility]  .*Total Encounter Time as defined by the Centers for Medicare and Medicaid Services includes, in addition to the face-to-face time of a patient visit (documented in the note above) non-face-to-face time: obtaining and reviewing outside history, ordering and reviewing medications, tests or procedures, care coordination (communications with other health care professionals or caregivers) and documentation in the medical record.   I, Cleda Mccreedy, am acting as a Education administrator for Sullivan Lone, MD.  .I have reviewed the above documentation for accuracy and completeness, and I agree with the above. Brunetta Genera MD

## 2022-02-07 ENCOUNTER — Encounter: Payer: Self-pay | Admitting: Hematology

## 2022-02-08 DIAGNOSIS — M6281 Muscle weakness (generalized): Secondary | ICD-10-CM | POA: Diagnosis not present

## 2022-02-08 DIAGNOSIS — Z9181 History of falling: Secondary | ICD-10-CM | POA: Diagnosis not present

## 2022-02-08 DIAGNOSIS — M9901 Segmental and somatic dysfunction of cervical region: Secondary | ICD-10-CM | POA: Diagnosis not present

## 2022-02-08 DIAGNOSIS — M9903 Segmental and somatic dysfunction of lumbar region: Secondary | ICD-10-CM | POA: Diagnosis not present

## 2022-02-08 DIAGNOSIS — M4726 Other spondylosis with radiculopathy, lumbar region: Secondary | ICD-10-CM | POA: Diagnosis not present

## 2022-02-08 DIAGNOSIS — R2681 Unsteadiness on feet: Secondary | ICD-10-CM | POA: Diagnosis not present

## 2022-02-08 DIAGNOSIS — R2689 Other abnormalities of gait and mobility: Secondary | ICD-10-CM | POA: Diagnosis not present

## 2022-02-08 DIAGNOSIS — M9902 Segmental and somatic dysfunction of thoracic region: Secondary | ICD-10-CM | POA: Diagnosis not present

## 2022-02-10 DIAGNOSIS — L282 Other prurigo: Secondary | ICD-10-CM | POA: Diagnosis not present

## 2022-02-10 DIAGNOSIS — L309 Dermatitis, unspecified: Secondary | ICD-10-CM | POA: Diagnosis not present

## 2022-02-10 DIAGNOSIS — Z85828 Personal history of other malignant neoplasm of skin: Secondary | ICD-10-CM | POA: Diagnosis not present

## 2022-02-15 DIAGNOSIS — M9901 Segmental and somatic dysfunction of cervical region: Secondary | ICD-10-CM | POA: Diagnosis not present

## 2022-02-15 DIAGNOSIS — M4726 Other spondylosis with radiculopathy, lumbar region: Secondary | ICD-10-CM | POA: Diagnosis not present

## 2022-02-15 DIAGNOSIS — M9903 Segmental and somatic dysfunction of lumbar region: Secondary | ICD-10-CM | POA: Diagnosis not present

## 2022-02-15 DIAGNOSIS — M9902 Segmental and somatic dysfunction of thoracic region: Secondary | ICD-10-CM | POA: Diagnosis not present

## 2022-02-17 ENCOUNTER — Encounter: Payer: Self-pay | Admitting: Podiatry

## 2022-02-17 ENCOUNTER — Ambulatory Visit (INDEPENDENT_AMBULATORY_CARE_PROVIDER_SITE_OTHER): Payer: Medicare Other | Admitting: Podiatry

## 2022-02-17 ENCOUNTER — Ambulatory Visit: Payer: Medicare Other | Admitting: Podiatry

## 2022-02-17 DIAGNOSIS — M2031 Hallux varus (acquired), right foot: Secondary | ICD-10-CM | POA: Diagnosis not present

## 2022-02-17 DIAGNOSIS — M79675 Pain in left toe(s): Secondary | ICD-10-CM | POA: Diagnosis not present

## 2022-02-17 DIAGNOSIS — M79674 Pain in right toe(s): Secondary | ICD-10-CM

## 2022-02-17 DIAGNOSIS — B351 Tinea unguium: Secondary | ICD-10-CM | POA: Diagnosis not present

## 2022-02-17 DIAGNOSIS — L608 Other nail disorders: Secondary | ICD-10-CM

## 2022-02-17 DIAGNOSIS — Q828 Other specified congenital malformations of skin: Secondary | ICD-10-CM | POA: Diagnosis not present

## 2022-02-17 NOTE — Progress Notes (Signed)

## 2022-02-22 DIAGNOSIS — R2681 Unsteadiness on feet: Secondary | ICD-10-CM | POA: Diagnosis not present

## 2022-02-22 DIAGNOSIS — M4726 Other spondylosis with radiculopathy, lumbar region: Secondary | ICD-10-CM | POA: Diagnosis not present

## 2022-02-22 DIAGNOSIS — M9902 Segmental and somatic dysfunction of thoracic region: Secondary | ICD-10-CM | POA: Diagnosis not present

## 2022-02-22 DIAGNOSIS — Z9181 History of falling: Secondary | ICD-10-CM | POA: Diagnosis not present

## 2022-02-22 DIAGNOSIS — M9903 Segmental and somatic dysfunction of lumbar region: Secondary | ICD-10-CM | POA: Diagnosis not present

## 2022-02-22 DIAGNOSIS — M9901 Segmental and somatic dysfunction of cervical region: Secondary | ICD-10-CM | POA: Diagnosis not present

## 2022-02-22 DIAGNOSIS — M6281 Muscle weakness (generalized): Secondary | ICD-10-CM | POA: Diagnosis not present

## 2022-02-22 DIAGNOSIS — R2689 Other abnormalities of gait and mobility: Secondary | ICD-10-CM | POA: Diagnosis not present

## 2022-02-25 DIAGNOSIS — R2689 Other abnormalities of gait and mobility: Secondary | ICD-10-CM | POA: Diagnosis not present

## 2022-02-25 DIAGNOSIS — Z9181 History of falling: Secondary | ICD-10-CM | POA: Diagnosis not present

## 2022-02-25 DIAGNOSIS — R2681 Unsteadiness on feet: Secondary | ICD-10-CM | POA: Diagnosis not present

## 2022-02-25 DIAGNOSIS — M6281 Muscle weakness (generalized): Secondary | ICD-10-CM | POA: Diagnosis not present

## 2022-03-01 DIAGNOSIS — M9901 Segmental and somatic dysfunction of cervical region: Secondary | ICD-10-CM | POA: Diagnosis not present

## 2022-03-01 DIAGNOSIS — M9902 Segmental and somatic dysfunction of thoracic region: Secondary | ICD-10-CM | POA: Diagnosis not present

## 2022-03-01 DIAGNOSIS — M9903 Segmental and somatic dysfunction of lumbar region: Secondary | ICD-10-CM | POA: Diagnosis not present

## 2022-03-01 DIAGNOSIS — M4726 Other spondylosis with radiculopathy, lumbar region: Secondary | ICD-10-CM | POA: Diagnosis not present

## 2022-03-02 DIAGNOSIS — M6281 Muscle weakness (generalized): Secondary | ICD-10-CM | POA: Diagnosis not present

## 2022-03-02 DIAGNOSIS — Z9181 History of falling: Secondary | ICD-10-CM | POA: Diagnosis not present

## 2022-03-02 DIAGNOSIS — R2681 Unsteadiness on feet: Secondary | ICD-10-CM | POA: Diagnosis not present

## 2022-03-02 DIAGNOSIS — R2689 Other abnormalities of gait and mobility: Secondary | ICD-10-CM | POA: Diagnosis not present

## 2022-03-03 ENCOUNTER — Ambulatory Visit: Payer: Medicare Other

## 2022-03-03 DIAGNOSIS — Z85828 Personal history of other malignant neoplasm of skin: Secondary | ICD-10-CM | POA: Diagnosis not present

## 2022-03-03 DIAGNOSIS — L308 Other specified dermatitis: Secondary | ICD-10-CM | POA: Diagnosis not present

## 2022-03-03 DIAGNOSIS — L309 Dermatitis, unspecified: Secondary | ICD-10-CM | POA: Diagnosis not present

## 2022-03-04 DIAGNOSIS — Z9181 History of falling: Secondary | ICD-10-CM | POA: Diagnosis not present

## 2022-03-04 DIAGNOSIS — R2681 Unsteadiness on feet: Secondary | ICD-10-CM | POA: Diagnosis not present

## 2022-03-04 DIAGNOSIS — M6281 Muscle weakness (generalized): Secondary | ICD-10-CM | POA: Diagnosis not present

## 2022-03-04 DIAGNOSIS — R2689 Other abnormalities of gait and mobility: Secondary | ICD-10-CM | POA: Diagnosis not present

## 2022-03-05 ENCOUNTER — Other Ambulatory Visit: Payer: Self-pay

## 2022-03-05 ENCOUNTER — Emergency Department (HOSPITAL_COMMUNITY)
Admission: EM | Admit: 2022-03-05 | Discharge: 2022-03-06 | Disposition: A | Discharge status: Home / Self Care | Payer: Medicare Other | Attending: Emergency Medicine | Admitting: Emergency Medicine

## 2022-03-05 ENCOUNTER — Emergency Department (HOSPITAL_COMMUNITY): Payer: Medicare Other

## 2022-03-05 ENCOUNTER — Encounter (HOSPITAL_COMMUNITY): Payer: Self-pay

## 2022-03-05 DIAGNOSIS — R202 Paresthesia of skin: Secondary | ICD-10-CM | POA: Diagnosis not present

## 2022-03-05 DIAGNOSIS — D649 Anemia, unspecified: Secondary | ICD-10-CM | POA: Diagnosis not present

## 2022-03-05 DIAGNOSIS — R600 Localized edema: Secondary | ICD-10-CM | POA: Diagnosis not present

## 2022-03-05 DIAGNOSIS — I129 Hypertensive chronic kidney disease with stage 1 through stage 4 chronic kidney disease, or unspecified chronic kidney disease: Secondary | ICD-10-CM | POA: Insufficient documentation

## 2022-03-05 DIAGNOSIS — R062 Wheezing: Secondary | ICD-10-CM | POA: Diagnosis not present

## 2022-03-05 DIAGNOSIS — N183 Chronic kidney disease, stage 3 unspecified: Secondary | ICD-10-CM | POA: Insufficient documentation

## 2022-03-05 DIAGNOSIS — G912 (Idiopathic) normal pressure hydrocephalus: Secondary | ICD-10-CM | POA: Diagnosis not present

## 2022-03-05 DIAGNOSIS — G510 Bell's palsy: Secondary | ICD-10-CM | POA: Diagnosis not present

## 2022-03-05 DIAGNOSIS — G319 Degenerative disease of nervous system, unspecified: Secondary | ICD-10-CM | POA: Diagnosis not present

## 2022-03-05 DIAGNOSIS — R200 Anesthesia of skin: Secondary | ICD-10-CM | POA: Diagnosis present

## 2022-03-05 DIAGNOSIS — Z7982 Long term (current) use of aspirin: Secondary | ICD-10-CM | POA: Insufficient documentation

## 2022-03-05 DIAGNOSIS — Z79899 Other long term (current) drug therapy: Secondary | ICD-10-CM | POA: Insufficient documentation

## 2022-03-05 DIAGNOSIS — R609 Edema, unspecified: Secondary | ICD-10-CM | POA: Diagnosis not present

## 2022-03-05 DIAGNOSIS — I10 Essential (primary) hypertension: Secondary | ICD-10-CM | POA: Diagnosis not present

## 2022-03-05 LAB — CBC WITH DIFFERENTIAL/PLATELET
Abs Immature Granulocytes: 0.02 10*3/uL (ref 0.00–0.07)
Basophils Absolute: 0.2 10*3/uL — ABNORMAL HIGH (ref 0.0–0.1)
Basophils Relative: 2 %
Eosinophils Absolute: 0.3 10*3/uL (ref 0.0–0.5)
Eosinophils Relative: 4 %
HCT: 29.3 % — ABNORMAL LOW (ref 36.0–46.0)
Hemoglobin: 9 g/dL — ABNORMAL LOW (ref 12.0–15.0)
Immature Granulocytes: 0 %
Lymphocytes Relative: 21 %
Lymphs Abs: 1.5 10*3/uL (ref 0.7–4.0)
MCH: 27.7 pg (ref 26.0–34.0)
MCHC: 30.7 g/dL (ref 30.0–36.0)
MCV: 90.2 fL (ref 80.0–100.0)
Monocytes Absolute: 0.6 10*3/uL (ref 0.1–1.0)
Monocytes Relative: 9 %
Neutro Abs: 4.7 10*3/uL (ref 1.7–7.7)
Neutrophils Relative %: 64 %
Platelets: 211 10*3/uL (ref 150–400)
RBC: 3.25 MIL/uL — ABNORMAL LOW (ref 3.87–5.11)
RDW: 17.2 % — ABNORMAL HIGH (ref 11.5–15.5)
WBC: 7.3 10*3/uL (ref 4.0–10.5)
nRBC: 0 % (ref 0.0–0.2)

## 2022-03-05 LAB — BASIC METABOLIC PANEL
Anion gap: 10 (ref 5–15)
BUN: 32 mg/dL — ABNORMAL HIGH (ref 8–23)
CO2: 23 mmol/L (ref 22–32)
Calcium: 8.5 mg/dL — ABNORMAL LOW (ref 8.9–10.3)
Chloride: 101 mmol/L (ref 98–111)
Creatinine, Ser: 1.05 mg/dL — ABNORMAL HIGH (ref 0.44–1.00)
GFR, Estimated: 50 mL/min — ABNORMAL LOW (ref >60)
Glucose, Bld: 101 mg/dL — ABNORMAL HIGH (ref 70–99)
Potassium: 4.9 mmol/L (ref 3.5–5.1)
Sodium: 134 mmol/L — ABNORMAL LOW (ref 135–145)

## 2022-03-05 NOTE — ED Provider Notes (Signed)
[facility] EMERGENCY DEPARTMENT Provider Note   CSN: [id number] Arrival date & time: 03/05/22  1221     History {Add pertinent medical, surgical, social history, OB history to HPI:1} Chief Complaint  Patient presents with   Numbness    LARNA CAPELLE is a 88 y.o. female.  The history is provided by the patient and medical records.  LEVERNE TESSLER is a 88 y.o. female who presents to the Emergency Department complaining of numbness.  She presents to the emergency department for evaluation of left facial numbness that was in the region of her upper lip and cheek.  Symptoms started when she woke around 830 this morning.  Symptoms were resolved at time of ED arrival.  She felt like she had Novocain to the area.  No associated pain.  No additional symptoms.  No recent illnesses.  She does have a history of hypertension.  She does use an estrogen patch for hormone replacement.  No tobacco use.  No history of TIA/CVA.     Home Medications Prior to Admission medications   Medication Sig Start Date End Date Taking? Authorizing Provider  Acetaminophen (ACETAMINOPHEN EXTRA STRENGTH) 500 MG capsule 1 capsule as needed    [provider]  allopurinol (ZYLOPRIM) 300 MG tablet Take 300 mg by mouth daily.    [provider]  amLODipine (NORVASC) 5 MG tablet Take 1 tablet (5 mg total) by mouth daily. 10/19/21   Belva Crome, MD  aspirin 81 MG tablet Take 81 mg by mouth every evening.    [provider]  B Complex Vitamins (VITAMIN B COMPLEX) TABS 1 tablet    [provider]  betamethasone dipropionate 0.05 % cream Apply topically 2 (two) times daily. Applied to the right ear canal twice daily for 4 to 5 days or until itching resolves. 12/22/20   Rozetta Nunnery, MD  Calcium Carbonate-Vit D-Min (CALCIUM 1200 PO) Take 1 tablet by mouth every evening.    [provider]  carboxymethylcellulose (REFRESH PLUS) 0.5 % SOLN Place 1  drop into both eyes in the morning and at bedtime.    [provider]  carvedilol (COREG) 12.5 MG tablet Take 1 tablet (12.5 mg total) by mouth 2 (two) times daily with a meal. 10/27/21   Belva Crome, MD  cholecalciferol (VITAMIN D) 1000 UNITS tablet Take 1,000 Units by mouth daily. D3    [provider]  Colchicine 0.6 MG CAPS 1 capsule 04/08/21   [provider]  diltiazem (CARDIZEM CD) 240 MG 24 hr capsule Take 1 capsule (240 mg total) by mouth daily. 10/27/21   Belva Crome, MD  estradiol (VIVELLE-DOT) 0.025 MG/24HR Place 1 patch onto the skin 2 (two) times a week. 03/12/21   Nunzio Cobbs, MD  ferrous sulfate 325 (65 FE) MG tablet Take 325 mg by mouth daily. 2 tablets daily 01/24/20   [provider]  Multiple Vitamins-Minerals (SYSTANE ICAPS AREDS2 PO) Take by mouth.    [provider]  mupirocin ointment (BACTROBAN) 2 % Apply 1 application topically See admin instructions. Apply to leg after showers 01/13/21   [provider]  omega-3 acid ethyl esters (LOVAZA) 1 g capsule Take 1 g by mouth daily.     [provider]  predniSONE (STERAPRED UNI-PAK 48 TAB) 5 MG (48) TBPK tablet See admin instructions. 08/07/21   [provider]  sodium chloride 1 g tablet Take 1 g by mouth daily. 08/21/19  [provider]  tretinoin (RETIN-A) 0.1 % cream Apply 1 application topically at bedtime as needed (breakouts). 04/29/20   [provider]      Allergies    Cephalexin, Shellfish allergy, Shellfish-derived products, Clindamycin/lincomycin, Lincomycin, Other, Penicillamine, Sugar-protein-starch, Tape, Ciprofloxacin, Sulfa antibiotics, and Sulfites    Review of Systems   Review of Systems  Physical Exam Updated Vital Signs BP (!) 198/93   Pulse 79   Temp 97.7 F (36.5 C) (Oral)   Resp 16   Ht 5' 4.5" (1.638 m)   Wt 64.9 kg   SpO2 99%   BMI 24.17 kg/m  Physical Exam  ED Results / Procedures /  Treatments   Labs (all labs ordered are listed, but only abnormal results are displayed) Labs Reviewed  BASIC METABOLIC PANEL - Abnormal; Notable for the following components:      Result Value   Sodium 134 (*)    Glucose, Bld 101 (*)    BUN 32 (*)    Creatinine, Ser 1.05 (*)    Calcium 8.5 (*)    GFR, Estimated 50 (*)    All other components within normal limits  CBC WITH DIFFERENTIAL/PLATELET - Abnormal; Notable for the following components:   RBC 3.25 (*)    Hemoglobin 9.0 (*)    HCT 29.3 (*)    RDW 17.2 (*)    Basophils Absolute 0.2 (*)    All other components within normal limits    EKG None  Radiology CT Head Wo Contrast  Result Date: 03/05/2022 CLINICAL DATA:  Facial paralysis left side.  Improving. EXAM: CT HEAD WITHOUT CONTRAST TECHNIQUE: Contiguous axial images were obtained from the base of the skull through the vertex without intravenous contrast. RADIATION DOSE REDUCTION: This exam was performed according to the departmental dose-optimization program which includes automated exposure control, adjustment of the mA and/or kV according to patient size and/or use of iterative reconstruction technique. COMPARISON:  CT Brain 12/01/21 FINDINGS: Brain: No evidence of acute infarction, hemorrhage, extra-axial collection or mass lesion/mass effect. Unchanged size and shape of the ventricular system with ventriculomegaly is disproportionate the degree of volume loss, particularly at the vertex. There is an acute callosal angle. These findings are unchanged compared to prior exam. There is periventricular hypodensity, favored to represent sequela of moderate chronic microvascular ischemic change. Vascular: No hyperdense vessel or unexpected calcification. Skull: Normal. Negative for fracture or focal lesion. Sinuses/Orbits: No acute finding. Bilateral lens replacement. Trace left mastoid effusion. Other: None. IMPRESSION: 1. No acute intracranial abnormality. 2. Unchanged size and shape  of the ventricular system. This configuration of the ventricular system is nonspecific, but could be seen in the setting of normal pressure hydrocephalus. Electronically Signed   By: Marin Roberts M.D.   On: 03/05/2022 14:04    Procedures Procedures  {Document cardiac monitor, telemetry assessment procedure when appropriate:1}  Medications Ordered in ED Medications - No data to display  ED Course/ Medical Decision Making/ A&P                           Medical Decision Making  ***  {Document critical care time when appropriate:1} {Document review of labs and clinical decision tools ie heart score, Chads2Vasc2 etc:1}  {Document your independent review of radiology images, and any outside records:1} {Document your discussion with family members, caretakers, and with consultants:1} {Document social determinants of health affecting pt's care:1} {Document your decision making why or why not admission, treatments were needed:1} Final  Clinical Impression(s) / ED Diagnoses Final diagnoses:  None    Rx / DC Orders ED Discharge Orders     None

## 2022-03-05 NOTE — ED Triage Notes (Signed)
Pt BIB [facility] from [facility] at [facility] c/o waking up this morning with left sided facial numbness and a swollen lip. Per EMS stroke screen was negative.

## 2022-03-05 NOTE — ED Provider Triage Note (Signed)
Emergency Medicine Provider Triage Evaluation Note  [name] , a 88 y.o. female  was evaluated in triage.  Patient brought in by EMS from [facility] at [facility] complaining of waking up this morning with left side facial numbness and a swollen lip.  She states that her symptoms have improved since this morning.  Denies difficulty swallowing, gait abnormality, unilateral weakness, visual changes or disturbances, headache, recent viral illness.    Review of Systems  Positive: As above Negative: As above  Physical Exam  BP (!) 176/80 (BP Location: Left Arm)   Pulse 68   Temp 97.8 F (36.6 C) (Oral)   Resp 18   Ht 5' 4.5" (1.638 m)   Wt 64.9 kg   SpO2 100%   BMI 24.17 kg/m  Gen:   Awake, no distress, A&Ox3   Resp:  Normal effort  MSK:   Moves extremities without difficulty  Other:  Grip strength equal bilaterally, no arm drift, no slurring of speech, no facial palsy, sensation of face equal bilaterally  Medical Decision Making  Medically screening exam initiated at 12:58 PM.  Appropriate orders placed.  [name] was informed that the remainder of the evaluation will be completed by another provider, this initial triage assessment does not replace that evaluation, and the importance of remaining in the ED until their evaluation is complete.     Theressa Stamps R, [place] 03/05/22 [phone]

## 2022-03-06 ENCOUNTER — Emergency Department (HOSPITAL_COMMUNITY): Payer: Medicare Other

## 2022-03-06 DIAGNOSIS — R202 Paresthesia of skin: Secondary | ICD-10-CM | POA: Diagnosis not present

## 2022-03-06 DIAGNOSIS — G319 Degenerative disease of nervous system, unspecified: Secondary | ICD-10-CM | POA: Diagnosis not present

## 2022-03-06 MED ORDER — CARVEDILOL 12.5 MG PO TABS
12.5000 mg | ORAL_TABLET | Freq: Once | ORAL | Status: AC
  Administered 2022-03-06: 12.5 mg via ORAL
  Filled 2022-03-06: qty 1

## 2022-03-06 MED ORDER — DILTIAZEM HCL ER COATED BEADS 240 MG PO CP24
240.0000 mg | ORAL_CAPSULE | Freq: Once | ORAL | Status: AC
  Administered 2022-03-06: 240 mg via ORAL
  Filled 2022-03-06: qty 1

## 2022-03-06 NOTE — ED Notes (Signed)
Patient to MRI.

## 2022-03-06 NOTE — ED Notes (Signed)
Patient requesting her coreg and cardizem.  MD Ralene Bathe made aware.

## 2022-03-12 ENCOUNTER — Other Ambulatory Visit: Payer: Self-pay | Admitting: Obstetrics and Gynecology

## 2022-03-12 NOTE — Telephone Encounter (Signed)
Last AEX 03/11/21 No next appt.  Dr. Quincy Simmonds advised pt to stop using patch. Pt would like to continue. Appt desk was informed to call to schedule pt

## 2022-03-16 ENCOUNTER — Other Ambulatory Visit: Payer: Self-pay

## 2022-03-16 DIAGNOSIS — Z79899 Other long term (current) drug therapy: Secondary | ICD-10-CM

## 2022-03-16 NOTE — Telephone Encounter (Signed)
FYI. Last AEX-03/11/2021-nothing scheduled. Last mammo-12/08/2020-neg birads 1, scheduled for 04/12/2022.   I called pt to see if she could schedule a 1 yr med check appt and she reported that she just got off the phone with someone to schedule.   She is scheduled for 03/17/2022 @ 1130.

## 2022-03-16 NOTE — Telephone Encounter (Signed)
Pt requesting to receive one patch today since she is out and then full script tomorrow when comes for her appt. OK?

## 2022-03-17 ENCOUNTER — Encounter: Payer: Self-pay | Admitting: Obstetrics and Gynecology

## 2022-03-17 ENCOUNTER — Ambulatory Visit (INDEPENDENT_AMBULATORY_CARE_PROVIDER_SITE_OTHER): Payer: Medicare Other | Admitting: Obstetrics and Gynecology

## 2022-03-17 VITALS — BP 120/74 | HR 66 | Ht 62.0 in | Wt 151.0 lb

## 2022-03-17 DIAGNOSIS — M25552 Pain in left hip: Secondary | ICD-10-CM | POA: Diagnosis not present

## 2022-03-17 DIAGNOSIS — Z5181 Encounter for therapeutic drug level monitoring: Secondary | ICD-10-CM | POA: Diagnosis not present

## 2022-03-17 DIAGNOSIS — Z96642 Presence of left artificial hip joint: Secondary | ICD-10-CM | POA: Diagnosis not present

## 2022-03-17 DIAGNOSIS — Z79899 Other long term (current) drug therapy: Secondary | ICD-10-CM | POA: Diagnosis not present

## 2022-03-17 NOTE — Progress Notes (Signed)
GYNECOLOGY  VISIT   HPI: 89 y.o.   Widowed  Caucasian  female   G2P2 with No LMP recorded. Patient has had a hysterectomy.   here for   rx refill.  Patient is on transdermal estrogen therapy.   Had recent CT scan on 03/06/22 for left facial numbness.   Results showed ventriculomegaly and possible superimposed normal pressure hydrocephalus. Small vessel ischemia noted in white matter.   Developed a skin lesion on her right breast 2 days ago.  She is treating with betamethasone, and it is improving.   She is not on a baby aspirin.  Her cardiologist told her to stop this.   Her mother had a stroke and her sister is bed ridden from a stroke.   PCP - Dr. Koleen Nimrod.   GYNECOLOGIC HISTORY: No LMP recorded. Patient has had a hysterectomy. Contraception:  hysterectomy Menopausal hormone therapy:  estradiol Last mammogram:  12/08/20 Breast Density Category C, BI-RADS CATEGORY 1 Negative Last pap smear:   2012        OB History     Gravida  2   Para  2   Term      Preterm      AB      Living  2      SAB      IAB      Ectopic      Multiple      Live Births                 Patient Active Problem List   Diagnosis Date Noted   Malaise and fatigue 07/15/2021   Primary osteoarthritis 04/20/2021   Inflammatory polyarthropathy ([place]) 04/20/2021   Hyperuricemia 04/20/2021   Fatigue 04/20/2021   Chronic kidney disease, stage 3 unspecified ([place]) 04/20/2021   Adjustment disorder with mixed disturbance of emotions and conduct 01/20/2021   Severe recurrent major depression without psychotic features ([place]) 01/19/2021   Pincer nail deformity 12/01/2020   Iron deficiency anemia 03/24/2020   Clavi 02/19/2020   Lumbar radiculopathy 06/17/2019   Pain due to onychomycosis of toenails of both feet 05/14/2019   Mixed incontinence 03/10/2019   Cervical spondylosis 06/07/2018   DDD (degenerative disc disease), cervical 05/20/2018   History of total knee replacement, left  04/13/2018   History of total knee replacement, right 04/13/2018   History of revision of total replacement of left hip joint 04/13/2018   Gait abnormality 09/08/2017   Chronic cystitis 07/05/2017   Dysuria 07/05/2017   Anemia 09/22/2015   h/o pericarditis    Essential hypertension    Aortic insufficiency    Posterior vitreous detachment, bilateral 06/05/2015   Left wrist pain 05/16/2015   Triquetral chip fracture, left, closed, initial encounter 05/16/2015   Primary osteoarthritis of first carpometacarpal joint of right hand 02/11/2015   Primary osteoarthritis of right wrist 02/11/2015   Arthropathy 11/25/2014   Subluxation 11/25/2014   Hip dislocation, left ([place]) 12/05/2013   Trochanteric bursitis of left hip 11/24/2012   Aneurysm of thoracic aorta (HCC)    Nonexudative age-related macular degeneration 04/22/2011   Posterior capsular opacification, right 04/22/2011   Status post intraocular lens implant 04/22/2011    Past Medical History:  Diagnosis Date   Anemia    CHRONIC   Aortic insufficiency    a. 2D echo 08/29/15: EF 55-60%, mild LVH, diastolic dysfunction, elevated LV filling pressure, mild AI, severe LAE, mild RAE, mild TR, dilated descending thoracic aorta just distal to the takeoff of the left subclavian,  measuring 4.1 cm, no pericardial effusion.   Arthritis    OA-SOME BACK AND NECK PAIN,  GOES TO CHIROPRACTOR TWICE A MONTH;  HX OF JOINT REPLACEMENTS    Cancer ([place])    SKIN CANCERS REMOVED FROM LEGS   CKD (chronic kidney disease), stage II    Depression    Essential hypertension    Gait abnormality 09/08/2017   GERD (gastroesophageal reflux disease)    Low sodium levels    Myocarditis (HCC)    PSVT (paroxysmal supraventricular tachycardia)    Recurrent Pericarditis    RECURRENT    Thoracic aneurysm    a. Followed by [facility] -last seen in 12/2013 w/ plan for f/u MRA and thoracic surgery f/u in 2 yrs, but patient does not wish to follow any longer.    Past  Surgical History:  Procedure Laterality Date   APPENDECTOMY     BREAST EXCISIONAL BIOPSY Left 1992   benign   EXCISION/RELEASE BURSA HIP Left 11/24/2012   Procedure: LEFT HIP BURSECTOMY AND TENDON REPAIR ;  Surgeon: Gearlean Alf, MD;  Location: [facility];  Service: Orthopedics;  Laterality: Left;   HIP CLOSED REDUCTION Left 12/19/2012   Procedure: CLOSED MANIPULATION HIP;  Surgeon: Mauri Pole, MD;  Location: [facility];  Service: Orthopedics;  Laterality: Left;   HIP CLOSED REDUCTION Left 12/05/2013   Procedure: CLOSED MANIPULATION HIP;  Surgeon: Augustin Schooling, MD;  Location: [facility];  Service: Orthopedics;  Laterality: Left;   HIP CLOSED REDUCTION Left 09/22/2015   Procedure: CLOSED REDUCTION HIP;  Surgeon: Melina Schools, MD;  Location: [facility];  Service: Orthopedics;  Laterality: Left;   JOINT REPLACEMENT     LEFT TOTAL HIP REPLACEMENT AND REVISIONS X 2   OOPHORECTOMY     has a partial of one ovary remaingin   PELVIC LAPAROSCOPY     ovarian cyst removal,    REVISION TOTAL HIP ARTHROPLASTY     left   right Achilles Tendon repair  5/13   TOTAL KNEE ARTHROPLASTY     right   TOTAL KNEE ARTHROPLASTY     left   VAGINAL HYSTERECTOMY     with ovarian cyst removal    Current Outpatient Medications  Medication Sig Dispense Refill   Acetaminophen (ACETAMINOPHEN EXTRA STRENGTH) 500 MG capsule 1 capsule as needed     allopurinol (ZYLOPRIM) 300 MG tablet Take 300 mg by mouth daily.     amLODipine (NORVASC) 5 MG tablet Take 1 tablet (5 mg total) by mouth daily. 90 tablet 3   aspirin 81 MG tablet Take 81 mg by mouth every evening.     B Complex Vitamins (VITAMIN B COMPLEX) TABS 1 tablet     betamethasone dipropionate 0.05 % cream Apply topically 2 (two) times daily. Applied to the right ear canal twice daily for 4 to 5 days or until itching resolves. 30 g 0   Calcium Carbonate-Vit D-Min (CALCIUM 1200 PO) Take 1 tablet by mouth every evening.     carboxymethylcellulose (REFRESH PLUS) 0.5 % SOLN  Place 1 drop into both eyes in the morning and at bedtime.     carvedilol (COREG) 12.5 MG tablet Take 1 tablet (12.5 mg total) by mouth 2 (two) times daily with a meal. 180 tablet 3   cholecalciferol (VITAMIN D) 1000 UNITS tablet Take 1,000 Units by mouth daily. D3     Colchicine 0.6 MG CAPS 1 capsule     diltiazem (CARDIZEM CD) 240 MG 24 hr capsule Take 1 capsule (240  mg total) by mouth daily. 90 capsule 3   estradiol (VIVELLE-DOT) 0.025 MG/24HR Place 1 patch onto the skin 2 (two) times a week. 24 patch 3   ferrous sulfate 325 (65 FE) MG tablet Take 325 mg by mouth daily. 2 tablets daily     Multiple Vitamins-Minerals (SYSTANE ICAPS AREDS2 PO) Take by mouth.     mupirocin ointment (BACTROBAN) 2 % Apply 1 application topically See admin instructions. Apply to leg after showers     omega-3 acid ethyl esters (LOVAZA) 1 g capsule Take 1 g by mouth daily.      predniSONE (STERAPRED UNI-PAK 48 TAB) 5 MG (48) TBPK tablet See admin instructions.     sodium chloride 1 g tablet Take 1 g by mouth daily.     tretinoin (RETIN-A) 0.1 % cream Apply 1 application topically at bedtime as needed (breakouts).     No current facility-administered medications for this visit.     ALLERGIES: Cephalexin, Shellfish allergy, Shellfish-derived products, Clindamycin/lincomycin, Lincomycin, Other, Penicillamine, Sugar-protein-starch, Tape, Ciprofloxacin, Sulfa antibiotics, and Sulfites  Family History  Problem Relation Age of Onset   Hypertension Mother    Stroke Mother    Heart attack Father 40   Heart failure Father    Heart failure Sister    Aortic aneurysm Sister    Aortic aneurysm Sister    Stroke Sister    Pneumonia Brother    Stroke Brother    ALS Other    Hyperlipidemia Daughter    Aortic aneurysm Son    Breast cancer Neg Hx     Social History   Socioeconomic History   Marital status: Widowed    Spouse name: Not on file   Number of children: Not on file   Years of education: Not on file    Highest education level: Not on file  Occupational History   Not on file  Tobacco Use   Smoking status: Former    Years: 5.00    Types: Cigarettes    Quit date: 42    Years since quitting: 59.0   Smokeless tobacco: Never   Tobacco comments:    quit age 63  Vaping Use   Vaping Use: Never used  Substance and Sexual Activity   Alcohol use: No    Alcohol/week: 0.0 standard drinks of alcohol   Drug use: Not Currently   Sexual activity: Not Currently    Birth control/protection: Surgical    Comment: HYST-1st intercourse 89 yo-Fewer than 5 partners  Other Topics Concern   Not on file  Social History Narrative   Diet: very good      Caffeine: 1 1/2 cup coffee (usually decaf)      Married, if yes what year: Widow, 1954      Do you live in a house, apartment, assisted living, condo, trailer, ect: [place], independent living      Is it one or more stories: 2nd level. elevate      How many persons live in your home? 1      Pets: No      Highest level or education completed: 14      Current/Past profession: Actuary      Exercise:  Yes                Type and how often: aerobics twice daily         Living Will: Yes   DNR: Yes   POA/HPOA: Yes      Functional Status:  Do you have difficulty bathing or dressing yourself? No   Do you have difficulty preparing food or eating? No   Do you have difficulty managing your medications? No   Do you have difficulty managing your finances? No   Do you have difficulty affording your medications? No   Social Determinants of Radio broadcast assistant Strain: Not on file  Food Insecurity: Not on file  Transportation Needs: Not on file  Physical Activity: Not on file  Stress: Not on file  Social Connections: Not on file  Intimate Partner Violence: Not on file    Review of Systems  All other systems reviewed and are negative.   PHYSICAL EXAMINATION:    BP 120/74 (BP Location: Right Arm, Patient  Position: Sitting, Cuff Size: Normal)   Pulse 66   Ht '5\' 2"'$  (1.575 m)   Wt 151 lb (68.5 kg)   SpO2 100%   BMI 27.62 kg/m     General appearance: alert, cooperative and appears stated age Head: Normocephalic, without obvious abnormality, atraumatic Neck: no adenopathy, supple, symmetrical, trachea midline and thyroid normal to inspection and palpation Lungs: clear to auscultation bilaterally Breasts: normal appearance, no masses or tenderness, No nipple retraction or dimpling, No nipple discharge or bleeding, No axillary or supraclavicular adenopathy Heart: regular rate and rhythm Abdomen: soft, non-tender, no masses,  no organomegaly Extremities:  brawny edema of LEs.  Skin: Skin color, texture, turgor normal. No rashes or lesions Lymph nodes: Cervical, supraclavicular, and axillary nodes normal. No abnormal inguinal nodes palpated Neurologic: Grossly normal  Pelvic: External genitalia:  no lesions              Urethra:  normal appearing urethra with no masses, tenderness or lesions              Bartholins and Skenes: normal                 Vagina: normal appearing vagina with normal color and discharge, no lesions              Cervix: no lesions                Bimanual Exam:  Uterus:  normal size, contour, position, consistency, mobility, non-tender              Adnexa: no mass, fullness, tenderness              Chaperone was present for exam:  Raquel Sarna  ASSESSMENT  Status post hysterectomy. One ovary remains.  On ERT.  Possible TIA. Hx aneurysm, aortic insufficiency, HTN, tachycardia, LE edema.     PLAN   I recommend discontinuation of all estrogen therapy.  We discussed risk of stroke, DVT, and PE with estrogen use.  The patient agrees to this plan.  Start baby ASA daily She will follow up with her PCP regarding her recent paresthesia event. Fu here in 2 years and prn.   An After Visit Summary was printed and given to the patient.  35 min  total time was spent for this  patient encounter, including preparation, face-to-face counseling with the patient, coordination of care, and documentation of the encounter.

## 2022-03-18 ENCOUNTER — Telehealth: Payer: Self-pay

## 2022-03-18 DIAGNOSIS — R2681 Unsteadiness on feet: Secondary | ICD-10-CM | POA: Diagnosis not present

## 2022-03-18 DIAGNOSIS — R2689 Other abnormalities of gait and mobility: Secondary | ICD-10-CM | POA: Diagnosis not present

## 2022-03-18 DIAGNOSIS — Z9181 History of falling: Secondary | ICD-10-CM | POA: Diagnosis not present

## 2022-03-18 DIAGNOSIS — M6281 Muscle weakness (generalized): Secondary | ICD-10-CM | POA: Diagnosis not present

## 2022-03-18 NOTE — Telephone Encounter (Signed)
        Patient  visited [facility] on 03/06/2022  for parasthesia.   Telephone encounter attempt :  1st  A HIPAA compliant voice message was left requesting a return call.  Instructed patient to call back at [phone].   [place] Resource Care Guide   [email or web address]  ?? [phone]   Website: [email or web address]  [email or web address]

## 2022-03-19 ENCOUNTER — Other Ambulatory Visit: Payer: Self-pay | Admitting: Obstetrics and Gynecology

## 2022-03-19 NOTE — Telephone Encounter (Signed)
Pt returned call stating that she apologized that she wasn't thinking clearly when she came in for her appt. However, she reported going down to the lowest dose of the estrogen as of last year and has been doing fine with that. Pt reports now that she has had some time to think about it, even being aware of the risks, she would like to continue on it and would be very appreciative if we could refill. Please advise.

## 2022-03-19 NOTE — Telephone Encounter (Signed)
Pt calling to request we approve this Rx.  Per BS notes from visit on 03/17/2022: "I recommend discontinuation of all estrogen therapy. We discussed risk of stroke, DVT, and PE with estrogen use. The patient agrees to this plan."   Left detailed VM on home machine per DPR.

## 2022-03-22 ENCOUNTER — Telehealth: Payer: Self-pay

## 2022-03-22 NOTE — Telephone Encounter (Signed)
     Patient  visit on 03/06/2022  at [facility] was for parasthesia.  Have you been able to follow up with your primary care physician? Patient stated she has scheduled an appointment.  The patient was or was not able to obtain any needed medicine or equipment. No medication prescribed.  Are there diet recommendations that you are having difficulty following? No  Patient expresses understanding of discharge instructions and education provided has no other needs at this time.    [place] Resource Care Guide   [email or web address]  ?? [phone]   Website: [email or web address]  [email or web address]

## 2022-03-22 NOTE — Telephone Encounter (Signed)
Patient called in voice mail stating she wanted Dr. Quincy Simmonds to put her back on the estrogen patch. She said she has been using if for years and years.  I called patient back and reviewed with her that at her visit Dr. Quincy Simmonds talked with her about stopping the estrogen because it puts her at increased risk of stroke and with her recent history and her family hx she does not want her to take it any longer.  Patient did not seem to recall this and was in agreement at the end of the conversation and voiced understanding as to why Dr. Quincy Simmonds had her d/c it.

## 2022-03-23 DIAGNOSIS — M6281 Muscle weakness (generalized): Secondary | ICD-10-CM | POA: Diagnosis not present

## 2022-03-23 DIAGNOSIS — Z9181 History of falling: Secondary | ICD-10-CM | POA: Diagnosis not present

## 2022-03-23 DIAGNOSIS — R2689 Other abnormalities of gait and mobility: Secondary | ICD-10-CM | POA: Diagnosis not present

## 2022-03-23 DIAGNOSIS — R2681 Unsteadiness on feet: Secondary | ICD-10-CM | POA: Diagnosis not present

## 2022-03-24 ENCOUNTER — Ambulatory Visit: Payer: Medicare Other | Admitting: Podiatry

## 2022-03-25 ENCOUNTER — Telehealth: Payer: Self-pay | Admitting: *Deleted

## 2022-03-25 DIAGNOSIS — M6281 Muscle weakness (generalized): Secondary | ICD-10-CM | POA: Diagnosis not present

## 2022-03-25 DIAGNOSIS — R2681 Unsteadiness on feet: Secondary | ICD-10-CM | POA: Diagnosis not present

## 2022-03-25 DIAGNOSIS — Z9181 History of falling: Secondary | ICD-10-CM | POA: Diagnosis not present

## 2022-03-25 DIAGNOSIS — R2689 Other abnormalities of gait and mobility: Secondary | ICD-10-CM | POA: Diagnosis not present

## 2022-03-25 NOTE — Telephone Encounter (Signed)
Patient left voicemail on Triage line stating she would like Dr. Quincy Simmonds to refill estrogen cream and she will "sign a wavier" and take "full responsibility for what happens to me" because "what a difference it makes."  Attempted to reach patient to discuss, but phone line busy, unable to leave a message.

## 2022-03-25 NOTE — Telephone Encounter (Signed)
Patient has left 2 more messages on Triage line stating she will do whatever it takes to get her hormones as she "feels so bad."

## 2022-03-26 ENCOUNTER — Ambulatory Visit: Payer: Medicare Other | Admitting: Podiatry

## 2022-03-26 NOTE — Telephone Encounter (Signed)
I have reviewed her chart, and agree that she shouldn't be on ERT. I would recommend she f/u with her primary to see if they can help with some of her symptoms. I would also recommend crisis if she is having suicidal thoughts.

## 2022-03-26 NOTE — Telephone Encounter (Signed)
Two additional message received from pt today re: "feeling horrible w/o her patches."   Spoke with pt to inquire what exact sxs she was experiencing before forwarding to provider. I asked if it was hot flashes and/or irritability, just to inquire of specific sxs we could possibly try to help w/ mgmt w/ a treatment w/ less risks. Pt just keep saying "everything, I just felt awful."  Pt reported to me that she had a bottle of 0.05 tablets on hand of estradiol and took one this AM since not hearing from [place] and is starting to feel better.  Pt advised that Dr.Silva denied her last refill request, likely due to her advice of benefits being less than risks involved.  Pt voicing that she does not care, "I would rather die than to continue feeling this way. I will sign a waiver if I have to."   Pt advised that Dr. Quincy Simmonds will return to office on Monday 03/29/2022 and I will contact her back as soon as I have a response.  Pt voiced understanding but inquired if it would be ok if she split the estrogen tablets in half to take until she hears back from [place]. I advised her that I cannot advise her of taking this medication at all due to providers recommendations. She voiced understanding.

## 2022-03-26 NOTE — Telephone Encounter (Signed)
Pt notified and voiced understanding. Reports not having any suicidal/homicidal ideations. She was just expression how being off of the medication made her feel.   Advised her I will still forward her concerns/requests to Dr. Quincy Simmonds. She did have an additional question as far as why is it the the risks now are more than before?   Please advise. Thanks.

## 2022-03-29 NOTE — Telephone Encounter (Signed)
Call returned to patient.  Kimalexis, CMA present for call.   Patient oriented x4. Patient states she is " a mess but feeling better now that she restarted old estradiol tablet". Patient states she is unsure of her symptoms after stopping the estrogen patches or how long she was off of them before symptoms started. States she is aware of the risk and does not care, states she feels better once she restarted old estrogen tablets. Patient denies SI/HI.   I asked the patient if she is aware of recommendations per Dr. Quincy Simmonds and why? Patient states "yes, maybe it will kill me, but I feel much better now. I can't stand how I was feeling". I again reviewed recommendations and how estrogen increases risk for stroke. Strongly encouraged stopping oral estrogen. Patient states she has an appt with her PCP/ Dr. Koleen Nimrod at [place] on Wednesday, will further discuss treatment options.  Patient declines OV to discuss with Dr. Quincy Simmonds. Patient states she is aware of recommendations.  Patient does not want daughter, [name], contacted to further discuss. States [name] lives in [place] and "nothing she can do". I asked the patient if she had any other questions, she declined, stating she was thankful for return call. I again reviewed Dr. Elza Rafter recommendations and ask that she return call to office if any other questions or assistance needed.   Routing to Dr. Quincy Simmonds to review.

## 2022-03-29 NOTE — Telephone Encounter (Signed)
-----  Message -----  From: Nunzio Cobbs, MD  Sent: 03/26/2022   2:36 PM EST  To: Burnice Logan, RN; Netta Corrigan, CMA  Subject: RE: HRT                                        I am sorry that I am not in the office today.    You may schedule an appointment with the patient, her daughter, me and the nursing supervisor and office manager next week if needed.   The patient should be seen by her PCP if she is feeling poorly.   She did call back several days after her office visit with me indicating that she wanted to have a prescription for estrogen.  It appeared to the triage nurse that the patient did not remember that I recommended stopping the medication.   I do not agree with her self-medicating, as the estrogen can increase her risk of stroke and blood clots in the circulatory system.   Josefa Half  ----- Message -----  From: Netta Corrigan, CMA  Sent: 03/26/2022   2:24 PM EST  To: Brook Oletta Lamas, MD; Burnice Logan, RN  Subject: HRT                                            We received a VM from pt's daughter Vara Guardian (on DPR from 02/2021) stating that last year the pt went on a lower dose of the estrogen patch and did fine with it. She then reports that this year, she was convinced that it was too risky for her until she reconsidered and now will not get prescribed the medication at all. She reports that the pt cannot even get anyone to return her call. She then states that her mom was put into a position where she felt she had to resort to taking a pill of an older rx of a higher dosage just to get some relief of sxs. She states that she feels the provider is being irresponsible with her mothers care and unwise to be messing with her medications.

## 2022-03-30 ENCOUNTER — Ambulatory Visit: Payer: Medicare Other | Admitting: Podiatry

## 2022-03-30 DIAGNOSIS — Z9181 History of falling: Secondary | ICD-10-CM | POA: Diagnosis not present

## 2022-03-30 DIAGNOSIS — R2681 Unsteadiness on feet: Secondary | ICD-10-CM | POA: Diagnosis not present

## 2022-03-30 DIAGNOSIS — M6281 Muscle weakness (generalized): Secondary | ICD-10-CM | POA: Diagnosis not present

## 2022-03-30 DIAGNOSIS — R2689 Other abnormalities of gait and mobility: Secondary | ICD-10-CM | POA: Diagnosis not present

## 2022-03-31 DIAGNOSIS — Z0000 Encounter for general adult medical examination without abnormal findings: Secondary | ICD-10-CM | POA: Diagnosis not present

## 2022-03-31 DIAGNOSIS — M25552 Pain in left hip: Secondary | ICD-10-CM | POA: Diagnosis not present

## 2022-03-31 DIAGNOSIS — D509 Iron deficiency anemia, unspecified: Secondary | ICD-10-CM | POA: Diagnosis not present

## 2022-03-31 DIAGNOSIS — K5289 Other specified noninfective gastroenteritis and colitis: Secondary | ICD-10-CM | POA: Diagnosis not present

## 2022-03-31 DIAGNOSIS — E871 Hypo-osmolality and hyponatremia: Secondary | ICD-10-CM | POA: Diagnosis not present

## 2022-03-31 DIAGNOSIS — M1030 Gout due to renal impairment, unspecified site: Secondary | ICD-10-CM | POA: Diagnosis not present

## 2022-03-31 DIAGNOSIS — N1832 Chronic kidney disease, stage 3b: Secondary | ICD-10-CM | POA: Diagnosis not present

## 2022-03-31 DIAGNOSIS — M7989 Other specified soft tissue disorders: Secondary | ICD-10-CM | POA: Diagnosis not present

## 2022-03-31 DIAGNOSIS — I10 Essential (primary) hypertension: Secondary | ICD-10-CM | POA: Diagnosis not present

## 2022-03-31 DIAGNOSIS — R5382 Chronic fatigue, unspecified: Secondary | ICD-10-CM | POA: Diagnosis not present

## 2022-03-31 DIAGNOSIS — M059 Rheumatoid arthritis with rheumatoid factor, unspecified: Secondary | ICD-10-CM | POA: Diagnosis not present

## 2022-04-01 ENCOUNTER — Telehealth: Payer: Self-pay | Admitting: *Deleted

## 2022-04-01 ENCOUNTER — Other Ambulatory Visit: Payer: Self-pay | Admitting: Obstetrics and Gynecology

## 2022-04-01 NOTE — Progress Notes (Signed)
Transdermal estrogen prescription discontinued by provider.

## 2022-04-01 NOTE — Telephone Encounter (Signed)
Patient called triage stating: Another Provider advised her to speak with Dr. Quincy Simmonds  and say that she takes full responsibility of taken the Estradiol.Please call mediation in. I asked the patient would that other Provider refill the mediation she said no. I will forward this message to Glorianne Manchester RN  for recommendations.

## 2022-04-01 NOTE — Telephone Encounter (Signed)
03/31/22 PCP visit in EPIC.  Will wait for Dr. Quincy Simmonds to review.

## 2022-04-05 DIAGNOSIS — M9903 Segmental and somatic dysfunction of lumbar region: Secondary | ICD-10-CM | POA: Diagnosis not present

## 2022-04-05 DIAGNOSIS — M9901 Segmental and somatic dysfunction of cervical region: Secondary | ICD-10-CM | POA: Diagnosis not present

## 2022-04-05 DIAGNOSIS — M9902 Segmental and somatic dysfunction of thoracic region: Secondary | ICD-10-CM | POA: Diagnosis not present

## 2022-04-05 DIAGNOSIS — M4726 Other spondylosis with radiculopathy, lumbar region: Secondary | ICD-10-CM | POA: Diagnosis not present

## 2022-04-05 NOTE — Telephone Encounter (Signed)
I have reviewed the PCP information in Epic.   I am not able to refill estrogen medication for the patient.   She is certainly welcome to another Gynecologist's opinion.

## 2022-04-05 NOTE — Telephone Encounter (Signed)
Spoke with patient.  Advised per Dr. Quincy Simmonds.  Patient states she will look at other GYN options. Will contact the office if she needs assistance with records transfer. Patient verbalizes understanding.   Kimalexis present for call.   Encounter closed.

## 2022-04-07 ENCOUNTER — Inpatient Hospital Stay (HOSPITAL_COMMUNITY)
Admission: EM | Admit: 2022-04-07 | Discharge: 2022-04-12 | DRG: 291 | Disposition: A | Discharge status: Home Health | Payer: Medicare Other | Attending: Internal Medicine | Admitting: Internal Medicine

## 2022-04-07 ENCOUNTER — Encounter: Payer: Self-pay | Admitting: Hematology

## 2022-04-07 ENCOUNTER — Emergency Department (HOSPITAL_COMMUNITY): Payer: Medicare Other

## 2022-04-07 ENCOUNTER — Encounter (HOSPITAL_COMMUNITY): Payer: Self-pay | Admitting: Internal Medicine

## 2022-04-07 DIAGNOSIS — Z79899 Other long term (current) drug therapy: Secondary | ICD-10-CM | POA: Diagnosis not present

## 2022-04-07 DIAGNOSIS — Z66 Do not resuscitate: Secondary | ICD-10-CM | POA: Diagnosis present

## 2022-04-07 DIAGNOSIS — Z85828 Personal history of other malignant neoplasm of skin: Secondary | ICD-10-CM

## 2022-04-07 DIAGNOSIS — N390 Urinary tract infection, site not specified: Secondary | ICD-10-CM | POA: Diagnosis not present

## 2022-04-07 DIAGNOSIS — R21 Rash and other nonspecific skin eruption: Secondary | ICD-10-CM | POA: Diagnosis present

## 2022-04-07 DIAGNOSIS — D638 Anemia in other chronic diseases classified elsewhere: Secondary | ICD-10-CM | POA: Diagnosis present

## 2022-04-07 DIAGNOSIS — Z823 Family history of stroke: Secondary | ICD-10-CM | POA: Diagnosis not present

## 2022-04-07 DIAGNOSIS — I5031 Acute diastolic (congestive) heart failure: Secondary | ICD-10-CM | POA: Diagnosis present

## 2022-04-07 DIAGNOSIS — I712 Thoracic aortic aneurysm, without rupture, unspecified: Secondary | ICD-10-CM | POA: Diagnosis present

## 2022-04-07 DIAGNOSIS — I10 Essential (primary) hypertension: Secondary | ICD-10-CM | POA: Diagnosis present

## 2022-04-07 DIAGNOSIS — Z87891 Personal history of nicotine dependence: Secondary | ICD-10-CM

## 2022-04-07 DIAGNOSIS — Z881 Allergy status to other antibiotic agents status: Secondary | ICD-10-CM | POA: Diagnosis not present

## 2022-04-07 DIAGNOSIS — Z888 Allergy status to other drugs, medicaments and biological substances status: Secondary | ICD-10-CM | POA: Diagnosis not present

## 2022-04-07 DIAGNOSIS — I351 Nonrheumatic aortic (valve) insufficiency: Secondary | ICD-10-CM | POA: Diagnosis not present

## 2022-04-07 DIAGNOSIS — K219 Gastro-esophageal reflux disease without esophagitis: Secondary | ICD-10-CM | POA: Diagnosis present

## 2022-04-07 DIAGNOSIS — F32A Depression, unspecified: Secondary | ICD-10-CM | POA: Diagnosis not present

## 2022-04-07 DIAGNOSIS — Z515 Encounter for palliative care: Secondary | ICD-10-CM | POA: Diagnosis not present

## 2022-04-07 DIAGNOSIS — Z8249 Family history of ischemic heart disease and other diseases of the circulatory system: Secondary | ICD-10-CM | POA: Diagnosis not present

## 2022-04-07 DIAGNOSIS — D649 Anemia, unspecified: Secondary | ICD-10-CM | POA: Diagnosis present

## 2022-04-07 DIAGNOSIS — I110 Hypertensive heart disease with heart failure: Secondary | ICD-10-CM | POA: Diagnosis not present

## 2022-04-07 DIAGNOSIS — I130 Hypertensive heart and chronic kidney disease with heart failure and stage 1 through stage 4 chronic kidney disease, or unspecified chronic kidney disease: Secondary | ICD-10-CM | POA: Diagnosis not present

## 2022-04-07 DIAGNOSIS — N182 Chronic kidney disease, stage 2 (mild): Secondary | ICD-10-CM | POA: Diagnosis present

## 2022-04-07 DIAGNOSIS — M1990 Unspecified osteoarthritis, unspecified site: Secondary | ICD-10-CM | POA: Diagnosis present

## 2022-04-07 DIAGNOSIS — N3020 Other chronic cystitis without hematuria: Secondary | ICD-10-CM | POA: Diagnosis not present

## 2022-04-07 DIAGNOSIS — Z1152 Encounter for screening for COVID-19: Secondary | ICD-10-CM | POA: Diagnosis not present

## 2022-04-07 DIAGNOSIS — I509 Heart failure, unspecified: Secondary | ICD-10-CM | POA: Diagnosis not present

## 2022-04-07 DIAGNOSIS — Z7189 Other specified counseling: Secondary | ICD-10-CM | POA: Diagnosis not present

## 2022-04-07 DIAGNOSIS — Z882 Allergy status to sulfonamides status: Secondary | ICD-10-CM | POA: Diagnosis not present

## 2022-04-07 DIAGNOSIS — R410 Disorientation, unspecified: Secondary | ICD-10-CM | POA: Diagnosis not present

## 2022-04-07 DIAGNOSIS — Z880 Allergy status to penicillin: Secondary | ICD-10-CM | POA: Diagnosis not present

## 2022-04-07 DIAGNOSIS — E871 Hypo-osmolality and hyponatremia: Secondary | ICD-10-CM | POA: Diagnosis present

## 2022-04-07 DIAGNOSIS — D631 Anemia in chronic kidney disease: Secondary | ICD-10-CM | POA: Diagnosis present

## 2022-04-07 DIAGNOSIS — I5032 Chronic diastolic (congestive) heart failure: Secondary | ICD-10-CM | POA: Diagnosis present

## 2022-04-07 DIAGNOSIS — Z87440 Personal history of urinary (tract) infections: Secondary | ICD-10-CM

## 2022-04-07 DIAGNOSIS — R5381 Other malaise: Secondary | ICD-10-CM | POA: Diagnosis present

## 2022-04-07 DIAGNOSIS — R4182 Altered mental status, unspecified: Secondary | ICD-10-CM | POA: Diagnosis not present

## 2022-04-07 DIAGNOSIS — R531 Weakness: Secondary | ICD-10-CM | POA: Diagnosis not present

## 2022-04-07 DIAGNOSIS — I872 Venous insufficiency (chronic) (peripheral): Secondary | ICD-10-CM | POA: Diagnosis not present

## 2022-04-07 DIAGNOSIS — Z96653 Presence of artificial knee joint, bilateral: Secondary | ICD-10-CM | POA: Diagnosis present

## 2022-04-07 DIAGNOSIS — R5383 Other fatigue: Secondary | ICD-10-CM | POA: Diagnosis not present

## 2022-04-07 DIAGNOSIS — D72829 Elevated white blood cell count, unspecified: Secondary | ICD-10-CM | POA: Diagnosis present

## 2022-04-07 DIAGNOSIS — Z7982 Long term (current) use of aspirin: Secondary | ICD-10-CM

## 2022-04-07 DIAGNOSIS — Z96642 Presence of left artificial hip joint: Secondary | ICD-10-CM | POA: Diagnosis present

## 2022-04-07 LAB — COMPREHENSIVE METABOLIC PANEL
ALT: 30 U/L (ref 0–44)
AST: 44 U/L — ABNORMAL HIGH (ref 15–41)
Albumin: 3.1 g/dL — ABNORMAL LOW (ref 3.5–5.0)
Alkaline Phosphatase: 89 U/L (ref 38–126)
Anion gap: 6 (ref 5–15)
BUN: 25 mg/dL — ABNORMAL HIGH (ref 8–23)
CO2: 22 mmol/L (ref 22–32)
Calcium: 8.2 mg/dL — ABNORMAL LOW (ref 8.9–10.3)
Chloride: 102 mmol/L (ref 98–111)
Creatinine, Ser: 0.87 mg/dL (ref 0.44–1.00)
GFR, Estimated: >60 mL/min (ref >60)
Glucose, Bld: 98 mg/dL (ref 70–99)
Potassium: 4.6 mmol/L (ref 3.5–5.1)
Sodium: 130 mmol/L — ABNORMAL LOW (ref 135–145)
Total Bilirubin: 0.4 mg/dL (ref 0.3–1.2)
Total Protein: 7.1 g/dL (ref 6.5–8.1)

## 2022-04-07 LAB — TROPONIN I (HIGH SENSITIVITY)
Troponin I (High Sensitivity): 11 ng/L (ref <18)
Troponin I (High Sensitivity): 14 ng/L (ref <18)

## 2022-04-07 LAB — URINALYSIS, ROUTINE W REFLEX MICROSCOPIC
Bilirubin Urine: NEGATIVE
Glucose, UA: NEGATIVE mg/dL
Ketones, ur: NEGATIVE mg/dL
Leukocytes,Ua: NEGATIVE
Nitrite: NEGATIVE
Protein, ur: 100 mg/dL — AB
Specific Gravity, Urine: 1.015 (ref 1.005–1.030)
pH: 5 (ref 5.0–8.0)

## 2022-04-07 LAB — BRAIN NATRIURETIC PEPTIDE: B Natriuretic Peptide: 413.6 pg/mL — ABNORMAL HIGH (ref 0.0–100.0)

## 2022-04-07 LAB — CBC
HCT: 25.7 % — ABNORMAL LOW (ref 36.0–46.0)
Hemoglobin: 8 g/dL — ABNORMAL LOW (ref 12.0–15.0)
MCH: 26.8 pg (ref 26.0–34.0)
MCHC: 31.1 g/dL (ref 30.0–36.0)
MCV: 86.2 fL (ref 80.0–100.0)
Platelets: 340 10*3/uL (ref 150–400)
RBC: 2.98 MIL/uL — ABNORMAL LOW (ref 3.87–5.11)
RDW: 15.8 % — ABNORMAL HIGH (ref 11.5–15.5)
WBC: 11.5 10*3/uL — ABNORMAL HIGH (ref 4.0–10.5)
nRBC: 0 % (ref 0.0–0.2)

## 2022-04-07 LAB — LACTIC ACID, PLASMA
Lactic Acid, Venous: 0.9 mmol/L (ref 0.5–1.9)
Lactic Acid, Venous: 0.8 mmol/L (ref 0.5–1.9)

## 2022-04-07 LAB — CBG MONITORING, ED: Glucose-Capillary: 144 mg/dL — ABNORMAL HIGH (ref 70–99)

## 2022-04-07 LAB — TSH: TSH: 3.479 u[IU]/mL (ref 0.350–4.500)

## 2022-04-07 MED ORDER — CARVEDILOL 3.125 MG PO TABS
3.1250 mg | ORAL_TABLET | Freq: Two times a day (BID) | ORAL | Status: DC
  Administered 2022-04-08 – 2022-04-10 (×5): 3.125 mg via ORAL
  Filled 2022-04-07 (×5): qty 1

## 2022-04-07 MED ORDER — ONDANSETRON HCL 4 MG/2ML IJ SOLN: 4.0000 mg | Freq: Four times a day (QID) | INTRAMUSCULAR | Status: DC | PRN

## 2022-04-07 MED ORDER — SODIUM CHLORIDE 0.9% FLUSH
3.0000 mL | Freq: Two times a day (BID) | INTRAVENOUS | Status: DC
  Administered 2022-04-07 – 2022-04-11 (×8): 3 mL via INTRAVENOUS

## 2022-04-07 MED ORDER — ZOLPIDEM TARTRATE 5 MG PO TABS
5.0000 mg | ORAL_TABLET | Freq: Every evening | ORAL | Status: DC | PRN
  Administered 2022-04-07: 5 mg via ORAL
  Filled 2022-04-07: qty 1

## 2022-04-07 MED ORDER — SODIUM CHLORIDE 0.9% FLUSH: 3.0000 mL | INTRAVENOUS | Status: DC | PRN

## 2022-04-07 MED ORDER — ENOXAPARIN SODIUM 40 MG/0.4ML IJ SOSY
40.0000 mg | PREFILLED_SYRINGE | INTRAMUSCULAR | Status: DC
  Administered 2022-04-07 – 2022-04-11 (×5): 40 mg via SUBCUTANEOUS
  Filled 2022-04-07 (×5): qty 0.4

## 2022-04-07 MED ORDER — FUROSEMIDE 10 MG/ML IJ SOLN
40.0000 mg | Freq: Once | INTRAMUSCULAR | Status: AC
  Administered 2022-04-07: 40 mg via INTRAVENOUS
  Filled 2022-04-07: qty 4

## 2022-04-07 MED ORDER — POTASSIUM CHLORIDE CRYS ER 10 MEQ PO TBCR
10.0000 meq | EXTENDED_RELEASE_TABLET | Freq: Every day | ORAL | Status: DC
  Administered 2022-04-07 – 2022-04-12 (×6): 10 meq via ORAL
  Filled 2022-04-07 (×6): qty 1

## 2022-04-07 MED ORDER — LOSARTAN POTASSIUM 50 MG PO TABS
25.0000 mg | ORAL_TABLET | Freq: Every day | ORAL | Status: DC
  Administered 2022-04-07 – 2022-04-12 (×6): 25 mg via ORAL
  Filled 2022-04-07 (×6): qty 1

## 2022-04-07 MED ORDER — SODIUM CHLORIDE 0.9 % IV SOLN: 250.0000 mL | INTRAVENOUS | Status: DC | PRN

## 2022-04-07 MED ORDER — ACETAMINOPHEN 325 MG PO TABS: 650.0000 mg | ORAL_TABLET | ORAL | Status: DC | PRN

## 2022-04-07 MED ORDER — FUROSEMIDE 10 MG/ML IJ SOLN
20.0000 mg | Freq: Three times a day (TID) | INTRAMUSCULAR | Status: AC
  Administered 2022-04-07 – 2022-04-08 (×3): 20 mg via INTRAVENOUS
  Filled 2022-04-07 (×3): qty 4

## 2022-04-07 NOTE — ED Provider Notes (Signed)
[place] EMERGENCY DEPARTMENT AT [facility] Provider Note   CSN: [id number] Arrival date & time: 04/07/22  1329     History  Chief Complaint  Patient presents with   Altered Mental Status    [name] is a 89 y.o. female.  Patient is a 89 year old female with a history of recurrent pericarditis, hypertension, thoracic aneurysm, anemia, CKD, aortic insufficiency with preserved EF in 2022 and chronic hyponatremia who is presenting today with complaints of fatigue and weakness.  Patient reports she is not sure how long it has been going on but all she wants to do is sleep.  She has noticed her legs have seemed more swollen than usual but does have chronic swelling in her legs.  Based on notes from the family she just wants to sleep all the time.  She reports she has no energy to get up and do anything.  She denies significant shortness of breath, chest pain or abdominal pain.  She has not had a fever, vomiting or diarrhea.  She does report recently seeing her PCP and being diagnosed with a UTI and started on antibiotics but she does not know which 1 reports it is not helping.  The history is provided by the patient and medical records.  Altered Mental Status      Home Medications Prior to Admission medications   Medication Sig Start Date End Date Taking? Authorizing Provider  Acetaminophen (ACETAMINOPHEN EXTRA STRENGTH) 500 MG capsule 1 capsule as needed    [provider]  allopurinol (ZYLOPRIM) 300 MG tablet Take 300 mg by mouth daily.    [provider]  amLODipine (NORVASC) 5 MG tablet Take 1 tablet (5 mg total) by mouth daily. 10/19/21   Belva Crome, MD  aspirin 81 MG tablet Take 81 mg by mouth every evening.    [provider]  B Complex Vitamins (VITAMIN B COMPLEX) TABS 1 tablet    [provider]  betamethasone dipropionate 0.05 % cream Apply topically 2 (two) times daily. Applied to the right ear canal twice daily for 4  to 5 days or until itching resolves. 12/22/20   Rozetta Nunnery, MD  Calcium Carbonate-Vit D-Min (CALCIUM 1200 PO) Take 1 tablet by mouth every evening.    [provider]  carboxymethylcellulose (REFRESH PLUS) 0.5 % SOLN Place 1 drop into both eyes in the morning and at bedtime.    [provider]  carvedilol (COREG) 12.5 MG tablet Take 1 tablet (12.5 mg total) by mouth 2 (two) times daily with a meal. 10/27/21   Belva Crome, MD  cholecalciferol (VITAMIN D) 1000 UNITS tablet Take 1,000 Units by mouth daily. D3    [provider]  Colchicine 0.6 MG CAPS 1 capsule 04/08/21   [provider]  diltiazem (CARDIZEM CD) 240 MG 24 hr capsule Take 1 capsule (240 mg total) by mouth daily. 10/27/21   Belva Crome, MD  ferrous sulfate 325 (65 FE) MG tablet Take 325 mg by mouth daily. 2 tablets daily 01/24/20   [provider]  Multiple Vitamins-Minerals (SYSTANE ICAPS AREDS2 PO) Take by mouth.    [provider]  mupirocin ointment (BACTROBAN) 2 % Apply 1 application topically See admin instructions. Apply to leg after showers 01/13/21   [provider]  omega-3 acid ethyl esters (LOVAZA) 1 g capsule Take 1 g by mouth daily.     [provider]  predniSONE (STERAPRED UNI-PAK 48 TAB) 5 MG (48) TBPK  tablet See admin instructions. 08/07/21   [provider]  sodium chloride 1 g tablet Take 1 g by mouth daily. 08/21/19   [provider]  tretinoin (RETIN-A) 0.1 % cream Apply 1 application topically at bedtime as needed (breakouts). 04/29/20   [provider]      Allergies    Cephalexin, Shellfish allergy, Shellfish-derived products, Clindamycin/lincomycin, Lincomycin, Clindamycin hcl, Fosfomycin, Levofloxacin, Other, Penicillamine, Sugar-protein-starch, Tape, Ciprofloxacin, Sulfa antibiotics, and Sulfites    Review of Systems   Review of Systems  Skin:        She does report that she itches all the time and  she is constantly scratching but is not significantly different than her baseline    Physical Exam Updated Vital Signs BP (!) 190/77   Pulse 81   Temp 98.7 F (37.1 C) (Oral)   Resp 18   SpO2 91%  Physical Exam Vitals and nursing note reviewed.  Constitutional:      General: She is not in acute distress.    Appearance: She is well-developed.  HENT:     Head: Normocephalic and atraumatic.  Eyes:     Pupils: Pupils are equal, round, and reactive to light.  Cardiovascular:     Rate and Rhythm: Normal rate and regular rhythm.     Heart sounds: Murmur heard.     Systolic murmur is present with a grade of 2/6.     No friction rub.  Pulmonary:     Effort: Pulmonary effort is normal.     Breath sounds: Normal breath sounds. Decreased air movement present. No wheezing or rales.  Abdominal:     General: Bowel sounds are normal. There is no distension.     Palpations: Abdomen is soft.     Tenderness: There is no abdominal tenderness. There is no guarding or rebound.  Musculoskeletal:        General: No tenderness. Normal range of motion.     Right lower leg: Edema present.     Left lower leg: Edema present.     Comments: 2-3+ pitting edema in bilateral lower ext  Skin:    General: Skin is warm and dry.     Findings: Rash present.     Comments: Excoriated skin in the back, chest and lower ext.  Intermittent scattered raised papules with dry skin and excoriation over the lower ext  Neurological:     Mental Status: She is alert and oriented to person, place, and time. Mental status is at baseline.     Cranial Nerves: No cranial nerve deficit.     Comments: Able to sit up without assistance.  5/5 strength in upper and 4/5 in lower ext without drift.    Psychiatric:        Behavior: Behavior normal.     Comments: Awake and cooperative     ED Results / Procedures / Treatments   Labs (all labs ordered are listed, but only abnormal results are displayed) Labs Reviewed  URINALYSIS,  ROUTINE W REFLEX MICROSCOPIC - Abnormal; Notable for the following components:      Result Value   APPearance HAZY (*)    Hgb urine dipstick SMALL (*)    Protein, ur 100 (*)    Bacteria, UA MANY (*)    All other components within normal limits  CBC - Abnormal; Notable for the following components:   WBC 11.5 (*)    RBC 2.98 (*)    Hemoglobin 8.0 (*)    HCT 25.7 (*)  RDW 15.8 (*)    All other components within normal limits  COMPREHENSIVE METABOLIC PANEL - Abnormal; Notable for the following components:   Sodium 130 (*)    BUN 25 (*)    Calcium 8.2 (*)    Albumin 3.1 (*)    AST 44 (*)    All other components within normal limits  BRAIN NATRIURETIC PEPTIDE - Abnormal; Notable for the following components:   B Natriuretic Peptide 413.6 (*)    All other components within normal limits  CBG MONITORING, ED - Abnormal; Notable for the following components:   Glucose-Capillary 144 (*)    All other components within normal limits  LACTIC ACID, PLASMA  TSH  LACTIC ACID, PLASMA  TROPONIN I (HIGH SENSITIVITY)  TROPONIN I (HIGH SENSITIVITY)    EKG EKG Interpretation  Date/Time:  Wednesday April 07 2022 13:42:33 EST Ventricular Rate:  73 PR Interval:  189 QRS Duration: 87 QT Interval:  376 QTC Calculation: 415 R Axis:   96 Text Interpretation: Sinus rhythm Left atrial enlargement Right axis deviation Consider left ventricular hypertrophy Anterior Q waves, possibly due to LVH Artifact in lead(s) I II III aVR aVL aVF V1 V2 No significant change since last tracing Confirmed by Blanchie Dessert [phone]) on 04/07/2022 3:37:28 PM  Radiology CT Head Wo Contrast  Result Date: 04/07/2022 CLINICAL DATA:  Delirium AMS EXAM: CT HEAD WITHOUT CONTRAST TECHNIQUE: Contiguous axial images were obtained from the base of the skull through the vertex without intravenous contrast. RADIATION DOSE REDUCTION: This exam was performed according to the departmental dose-optimization program which includes  automated exposure control, adjustment of the mA and/or kV according to patient size and/or use of iterative reconstruction technique. COMPARISON:  03/05/2022. FINDINGS: Brain: There is periventricular white matter decreased attenuation consistent with small vessel ischemic changes. Ventricles, sulci and cisterns are prominent consistent with age related involutional changes. No acute intracranial hemorrhage, mass effect or shift. Ventricular prominence seems out of proportion to the extent of involutional changes which can be seen with normal pressure hydrocephalus, and this is stable finding. Vascular: No hyperdense vessel or unexpected calcification. Skull: Normal. Negative for fracture or focal lesion. Sinuses/Orbits: No acute finding. IMPRESSION: 1. Atrophy and chronic small vessel ischemic changes. 2. Possible normal pressure hydrocephalus. 3. Otherwise no acute intracranial process identified. Electronically Signed   By: Sammie Bench M.D.   On: 04/07/2022 14:32   DG Chest 2 View  Result Date: 04/07/2022 CLINICAL DATA:  Altered mental status.  Urinary tract infection. EXAM: CHEST - 2 VIEW COMPARISON:  AP chest 01/16/2021, chest two views 11/12/2016 FINDINGS: Cardiac silhouette is at the upper limits of normal size. Mediastinal contours are within normal limits. New moderate to high-grade bilateral interstitial thickening with diffuse bilateral patchy heterogeneous airspace opacities. No definite pleural effusion. No pneumothorax. Moderate to severe multilevel degenerative disc changes of the thoracic spine. IMPRESSION: New moderate to high-grade bilateral interstitial thickening with bilateral patchy heterogeneous airspace opacities diffusely. Findings may represent pulmonary edema or multifocal pneumonia. Electronically Signed   By: Yvonne Kendall M.D.   On: 04/07/2022 14:19    Procedures Procedures    Medications Ordered in ED Medications  furosemide (LASIX) injection 40 mg (has no  administration in time range)    ED Course/ Medical Decision Making/ A&P                             Medical Decision Making Amount and/or Complexity of Data Reviewed Labs:  ordered.  Risk Prescription drug management.   Pt with multiple medical problems and comorbidities and presenting today with a complaint that caries a high risk for morbidity and mortality.  Here today with complaint of severe fatigue and weakness.  Patient denies any pain but has noticed worsening swelling.  She reports recent diagnosis of UTI but is not sure what antibiotic she is on.  Patient has multiple reasons for being tired.  She does have a history of chronic anemia and hyponatremia or thyroid disorder.  Concern for worsening of these versus concern for new heart failure as patient does have a history of moderate aortic stenosis but preserved EF 2 years ago.  Patient is not having fever vomiting, abdominal pain or diarrhea and lower suspicion for pneumonia or acute infection at this time.  I have independently visualized and interpreted pt's images today.  Chest x-ray today concerning for cardiomegaly and bilateral pulmonary edema.  Radiology reports possible multifocal pneumonia but patient is not displaying symptoms classic for pneumonia.  Head CT is negative for acute process but radiology reports possible normal pressure hydrocephalus.  I independently interpreted patient's labs and EKG.  EKG with artifact but no acute changes.  CBC with minimal leukocytosis of 11, hemoglobin of 8.0 which is 1 g lower than prior of 9, normal platelet count, CMP with persistent hyponatremia 130 today, preserved creatinine 0.87, lactic acid and TSH are within normal limits.  BNP is elevated in the 400s and troponin is 11.  UA without significant findings consistent with a UTI.  Patient given IV Lasix.  Will need admission for diuresis and a new echo.  Low suspicion for pneumonia today.  CRITICAL CARE Performed by: Franziska Podgurski Total critical care time: 30 minutes Critical care time was exclusive of separately billable procedures and treating other patients. Critical care was necessary to treat or prevent imminent or life-threatening deterioration. Critical care was time spent personally by me on the following activities: development of treatment plan with patient and/or surrogate as well as nursing, discussions with consultants, evaluation of patient's response to treatment, examination of patient, obtaining history from patient or surrogate, ordering and performing treatments and interventions, ordering and review of laboratory studies, ordering and review of radiographic studies, pulse oximetry and re-evaluation of patient's condition.           Final Clinical Impression(s) / ED Diagnoses Final diagnoses:  Acute congestive heart failure, unspecified heart failure type [facility])    Rx / DC Orders ED Discharge Orders     None         Blanchie Dessert, MD 04/07/22 1859

## 2022-04-07 NOTE — ED Triage Notes (Signed)
Pt arrives via [facility] from [place] for AMS and UTI. Pt recently seen at PCP and diagnosed with UTI. Pt also has chronically low sodium and anemic, but both were near her baseline.

## 2022-04-07 NOTE — ED Provider Triage Note (Signed)
Emergency Medicine Provider Triage Evaluation Note  [name] , a 89 y.o. female  was evaluated in triage.  Pt is here for evaluation of increased confusion.  Patient recently diagnosed with UTI.  She also has chronically low sodium and anemia.  She is a and O x 3 in triage.  She denies having any pain or symptoms right now.  No recent fall.  Review of Systems  Positive: As above Negative: As above  Physical Exam  There were no vitals taken for this visit. Gen:   Awake, no distress   Resp:  Normal effort  MSK:   Moves extremities without difficulty  Other:  Erythematous skin on the lateral aspect of the R thigh  Medical Decision Making  Medically screening exam initiated at 1:50 PM.  Appropriate orders placed.  [name] was informed that the remainder of the evaluation will be completed by another provider, this initial triage assessment does not replace that evaluation, and the importance of remaining in the ED until their evaluation is complete.    Rex Kras, PA 04/08/22 0900

## 2022-04-07 NOTE — Assessment & Plan Note (Signed)
Long h/o normocytic anemia. Last hematology visit 02/01/22 where Hgb = 9.4. She is on iron and B12. Hgb now 8 and a contributor to weakness  Plan Serum Iron level, B12 level  Based on results may benefit from IV iron  For persistent weakness after treating HF may benefit in terms of comfort care from transfusion.

## 2022-04-07 NOTE — Assessment & Plan Note (Addendum)
Patient presenting with generalized weakness and fatigue. CXR reveals opacities favoring pulmonary edema vs multi-focal PNA. She has no fever, minimal elevation WBC, mildly elevated BNP. Echo 2017 revealed DD; [name] 10/15/20 w/ nl EF, no DD noted. 5cm JVD on exam, diffuse rales worse on right.  Plan Med-surg admit  ECHO  Furosemide 20 mg IV q6 x 4 doses  D/c amolodipine  Start coreg  Start losartan  HF protocols.  Procalcitonin to f/o PNA - please consult patient before starting any abx treatment

## 2022-04-07 NOTE — Subjective & Objective (Signed)
[name], a  89 year old female with a history of recurrent pericarditis, hypertension, thoracic aneurysm, anemia, CKD, aortic insufficiency with preserved EF in 2022 and chronic hyponatremia. For several days she has been much more fatigued than usual and reports that she sleeps all the time. She denies having any chest pain, fevers or chills, significant shortness of breath. She has recently been treated for a UTI. Due to her symptoms she presents to [facility] for evaluation.

## 2022-04-07 NOTE — H&P (Signed)
History and Physical    [name]:[id number] DOB: 05-14-1929 DOA: 04/07/2022  DOS: the patient was seen and examined on 04/07/2022  PCP: Kathalene Frames, MD   Patient coming from: Home  I have personally briefly reviewed patient's old medical records in [place]  [name], a  89 year old female with a history of recurrent pericarditis, hypertension, thoracic aneurysm, anemia, CKD, aortic insufficiency with preserved EF in 2022 and chronic hyponatremia. For several days she has been much more fatigued than usual and reports that she sleeps all the time. She denies having any chest pain, fevers or chills, significant shortness of breath. She has recently been treated for a UTI. Due to her symptoms she presents to [facility] for evaluation.   ED Course: T 98.7  173/84  HR 81  RR 17 EDP exam reports heart mm, decrased air movemnt, bilateral LE edema. Lab: Na 130  Cr 0.87  BNP 413.6, Troponin 11 - 14, Lactic acid 0.9, TSH nl, WBC 11.5, Hgb 8. CXR abnormal suggesting multifocal PNA vs pulmonary edema. IN ED patient given 40 mg Lasix IV. [facility] called to admit for further evaluation of HF, weakness.  Review of Systems:  Review of Systems  Constitutional:  Positive for malaise/fatigue. Negative for chills, fever and weight loss.  HENT: Negative.    Eyes: Negative.   Respiratory:  Negative for cough, shortness of breath and wheezing.   Cardiovascular:  Positive for leg swelling. Negative for chest pain, palpitations and orthopnea.  Gastrointestinal: Negative.   Genitourinary: Negative.   Musculoskeletal:  Positive for myalgias.       Feels bad all over  Skin:        Chronic swelling of legs with skin changes  Neurological:  Positive for weakness.  Endo/Heme/Allergies:  Does not bruise/bleed easily.  Psychiatric/Behavioral: Negative.      Past Medical History:  Diagnosis Date   Anemia    CHRONIC   Aortic insufficiency    a. 2D echo 08/29/15: EF 55-60%, mild LVH,  diastolic dysfunction, elevated LV filling pressure, mild AI, severe LAE, mild RAE, mild TR, dilated descending thoracic aorta just distal to the takeoff of the left subclavian, measuring 4.1 cm, no pericardial effusion.   Arthritis    OA-SOME BACK AND NECK PAIN,  GOES TO CHIROPRACTOR TWICE A MONTH;  HX OF JOINT REPLACEMENTS    Cancer ([place])    SKIN CANCERS REMOVED FROM LEGS   CKD (chronic kidney disease), stage II    Depression    Essential hypertension    Gait abnormality 09/08/2017   GERD (gastroesophageal reflux disease)    Low sodium levels    Myocarditis (HCC)    PSVT (paroxysmal supraventricular tachycardia)    Recurrent Pericarditis    RECURRENT    Thoracic aneurysm    a. Followed by [facility] -last seen in 12/2013 w/ plan for f/u MRA and thoracic surgery f/u in 2 yrs, but patient does not wish to follow any longer.    Past Surgical History:  Procedure Laterality Date   APPENDECTOMY     BREAST EXCISIONAL BIOPSY Left 1992   benign   EXCISION/RELEASE BURSA HIP Left 11/24/2012   Procedure: LEFT HIP BURSECTOMY AND TENDON REPAIR ;  Surgeon: Gearlean Alf, MD;  Location: [facility];  Service: Orthopedics;  Laterality: Left;   HIP CLOSED REDUCTION Left 12/19/2012   Procedure: CLOSED MANIPULATION HIP;  Surgeon: Mauri Pole, MD;  Location: [facility];  Service: Orthopedics;  Laterality: Left;   HIP CLOSED  REDUCTION Left 12/05/2013   Procedure: CLOSED MANIPULATION HIP;  Surgeon: Augustin Schooling, MD;  Location: [facility];  Service: Orthopedics;  Laterality: Left;   HIP CLOSED REDUCTION Left 09/22/2015   Procedure: CLOSED REDUCTION HIP;  Surgeon: Melina Schools, MD;  Location: [facility];  Service: Orthopedics;  Laterality: Left;   JOINT REPLACEMENT     LEFT TOTAL HIP REPLACEMENT AND REVISIONS X 2   OOPHORECTOMY     has a partial of one ovary remaingin   PELVIC LAPAROSCOPY     ovarian cyst removal,    REVISION TOTAL HIP ARTHROPLASTY     left   right Achilles Tendon repair  5/13   TOTAL KNEE  ARTHROPLASTY     right   TOTAL KNEE ARTHROPLASTY     left   VAGINAL HYSTERECTOMY     with ovarian cyst removal    Soc Hx - 1st marriage - 25 years, 2nd 14 years, 3rd 23 years. She has one son, one daughter, two grand-daughters. She lives alone at [employer].   reports that she quit smoking about 59 years ago. Her smoking use included cigarettes. She has never used smokeless tobacco. She reports that she does not currently use drugs. She reports that she does not drink alcohol.  Allergies  Allergen Reactions   Cephalexin Other (See Comments)    Pt states this caused eye problems and STROKE-LIKE SYMPTOMS!!   Shellfish-Derived Products Hives and Rash   Clindamycin/Lincomycin Rash   Lincomycin Rash   Clindamycin Hcl Other (See Comments)    "Per pharmacist"   Fosfomycin Other (See Comments)    Disorientation   Levofloxacin Other (See Comments)    Reaction not recalled by the patient   Penicillamine Other (See Comments)    Reaction not recalled by the patient   Sugar-Protein-Starch Other (See Comments)    Sugary foods cause legs to cramp   Tape Other (See Comments)    Please use paper tape or Coban Wrap; patient's skin tears VERY easily!!   Ciprofloxacin Rash   Sulfa Antibiotics Hives and Rash   Sulfites Hives, Swelling and Rash    Family History  Problem Relation Age of Onset   Hypertension Mother    Stroke Mother    Heart attack Father 51   Heart failure Father    Heart failure Sister    Aortic aneurysm Sister    Aortic aneurysm Sister    Stroke Sister    Pneumonia Brother    Stroke Brother    ALS Other    Hyperlipidemia Daughter    Aortic aneurysm Son    Breast cancer Neg Hx     Prior to Admission medications   Medication Sig Start Date End Date Taking? Authorizing Provider  allopurinol (ZYLOPRIM) 100 MG tablet Take 50 mg by mouth in the morning.   Yes [provider]  amLODipine (NORVASC) 5 MG tablet Take 1 tablet (5 mg total) by mouth daily. Patient  taking differently: Take 5 mg by mouth at bedtime. 10/19/21  Yes Belva Crome, MD  amoxicillin (AMOXIL) 500 MG capsule Take 2,000 mg by mouth See admin instructions. Take 2,000 mg by mouth one hour prior to dental procedures   Yes [provider]  aspirin 81 MG tablet Take 81 mg by mouth at bedtime.   Yes [provider]  B Complex Vitamins (VITAMIN B COMPLEX) TABS Take 1 tablet by mouth daily with breakfast.   Yes [provider]  carvedilol (COREG) 12.5 MG tablet Take 1  tablet (12.5 mg total) by mouth 2 (two) times daily with a meal. 10/27/21  Yes Belva Crome, MD  Cholecalciferol (VITAMIN D3) 25 MCG (1000 UT) CAPS Take 1,000 Units by mouth daily.   Yes [provider]  diltiazem (CARDIZEM CD) 240 MG 24 hr capsule Take 1 capsule (240 mg total) by mouth daily. 10/27/21  Yes Belva Crome, MD  FEROSUL 325 (65 Fe) MG tablet Take 650 mg by mouth daily with breakfast.   Yes [provider]  MITIGARE 0.6 MG CAPS Take 0.6 mg by mouth in the morning.   Yes [provider]  Multiple Vitamins-Minerals (SYSTANE ICAPS AREDS2 PO) Take 1 capsule by mouth in the morning and at bedtime.   Yes [provider]  nitrofurantoin, macrocrystal-monohydrate, (MACROBID) 100 MG capsule Take 100 mg by mouth in the morning and at bedtime. 04/03/22 04/10/22 Yes [provider]  NONFORMULARY OR COMPOUNDED ITEM Apply 1 application  topically See admin instructions. Triamcinolone 0.1% cream/Cetaphil compounded cream- Apply to the back, legs, and arms 2 times a day as directed   Yes [provider]  sodium chloride 1 g tablet Take 1 g by mouth daily. 08/21/19  Yes [provider]  TYLENOL 500 MG tablet Take 500-1,000 mg by mouth every 6 (six) hours as needed for mild pain or headache.   Yes [provider]  betamethasone dipropionate 0.05 % cream Apply topically 2 (two) times daily. Applied to the right ear canal twice daily for 4 to 5  days or until itching resolves. Patient not taking: Reported on 04/07/2022 12/22/20   Rozetta Nunnery, MD    Physical Exam: Vitals:   04/07/22 1930 04/07/22 2000 04/07/22 2045 04/07/22 2113  BP: (!) 176/75 (!) 187/74 (!) 177/78   Pulse: 81 83 84   Resp: '18 16 17   '$ Temp:    98.1 F (36.7 C)  TempSrc:    Oral  SpO2: 92% 91% 90%     Physical Exam Vitals and nursing note reviewed.  Constitutional:      Comments: Overweight.  HENT:     Head: Normocephalic and atraumatic.     Mouth/Throat:     Mouth: Mucous membranes are moist.     Pharynx: Oropharynx is clear. No oropharyngeal exudate.     Comments: Good dentitiion Eyes:     General: No scleral icterus.    Extraocular Movements: Extraocular movements intact.     Conjunctiva/sclera: Conjunctivae normal.     Pupils: Pupils are equal, round, and reactive to light.  Neck:     Vascular: No carotid bruit.  Cardiovascular:     Rate and Rhythm: Regular rhythm.     Chest Wall: PMI is not displaced.     Pulses:          Carotid pulses are 2+ on the right side and 2+ on the left side.      Radial pulses are 2+ on the right side and 2+ on the left side.       Femoral pulses are 2+ on the right side and 2+ on the left side.      Popliteal pulses are 2+ on the right side and 2+ on the left side.       Dorsalis pedis pulses are 2+ on the right side and 2+ on the left side.       Posterior tibial pulses are 2+ on the right side and 2+ on the left side.     Heart sounds:  Murmur heard.     Systolic murmur is present with a grade of 2/6.     No diastolic murmur is present.     No gallop.  Pulmonary:     Effort: Pulmonary effort is normal. No accessory muscle usage.     Breath sounds: Decreased air movement present. Examination of the right-middle field reveals rales. Examination of the left-middle field reveals rales. Examination of the right-lower field reveals decreased breath sounds and rales. Examination of the left-lower field  reveals decreased breath sounds and rales. Decreased breath sounds and rales present. No wheezing.     Comments: Decreased breath sounds more on the right. Rales on auscultation anterior chest and bibasilar Musculoskeletal:     Cervical back: Normal range of motion and neck supple.     Right lower leg: 3+ Edema present.     Left lower leg: 3+ Edema present.  Skin:    Coloration: Skin is not jaundiced.     Comments: Bilateral LE with thicken erythematous skin c/w chronic venous stasis dermatitis  Neurological:     General: No focal deficit present.     Mental Status: She is alert and oriented to person, place, and time.     Cranial Nerves: No cranial nerve deficit.     Motor: Weakness present.     Comments: Difficulty with recalling information - I.e.g years married, current medications  Psychiatric:        Mood and Affect: Mood normal.      Labs on Admission: I have personally reviewed following labs and imaging studies  CBC: Recent Labs  Lab 04/07/22 1640  WBC 11.5*  HGB 8.0*  HCT 25.7*  MCV 86.2  PLT 188   Basic Metabolic Panel: Recent Labs  Lab 04/07/22 1640  NA 130*  K 4.6  CL 102  CO2 22  GLUCOSE 98  BUN 25*  CREATININE 0.87  CALCIUM 8.2*   GFR: CrCl cannot be calculated (Unknown ideal weight.). Liver Function Tests: Recent Labs  Lab 04/07/22 1640  AST 44*  ALT 30  ALKPHOS 89  BILITOT 0.4  PROT 7.1  ALBUMIN 3.1*   No results for input(s): "LIPASE", "AMYLASE" in the last 168 hours. No results for input(s): "AMMONIA" in the last 168 hours. Coagulation Profile: No results for input(s): "INR", "PROTIME" in the last 168 hours. Cardiac Enzymes: No results for input(s): "CKTOTAL", "CKMB", "CKMBINDEX", "TROPONINI" in the last 168 hours. BNP (last 3 results) No results for input(s): "PROBNP" in the last 8760 hours. HbA1C: No results for input(s): "HGBA1C" in the last 72 hours. CBG: Recent Labs  Lab 04/07/22 1339  GLUCAP 144*   Lipid Profile: No  results for input(s): "CHOL", "HDL", "LDLCALC", "TRIG", "CHOLHDL", "LDLDIRECT" in the last 72 hours. Thyroid Function Tests: Recent Labs    04/07/22 1640  TSH 3.479   Anemia Panel: No results for input(s): "VITAMINB12", "FOLATE", "FERRITIN", "TIBC", "IRON", "RETICCTPCT" in the last 72 hours. Urine analysis:    Component Value Date/Time   COLORURINE YELLOW 04/07/2022 1348   APPEARANCEUR HAZY (A) 04/07/2022 1348   LABSPEC 1.015 04/07/2022 1348   PHURINE 5.0 04/07/2022 1348   GLUCOSEU NEGATIVE 04/07/2022 1348   HGBUR SMALL (A) 04/07/2022 1348   BILIRUBINUR NEGATIVE 04/07/2022 1348   KETONESUR NEGATIVE 04/07/2022 1348   PROTEINUR 100 (A) 04/07/2022 1348   UROBILINOGEN 0.2 04/17/2021 1314   NITRITE NEGATIVE 04/07/2022 1348   LEUKOCYTESUR NEGATIVE 04/07/2022 1348    Radiological Exams on Admission: I have personally reviewed images CT Head Wo  Contrast  Result Date: 04/07/2022 CLINICAL DATA:  Delirium AMS EXAM: CT HEAD WITHOUT CONTRAST TECHNIQUE: Contiguous axial images were obtained from the base of the skull through the vertex without intravenous contrast. RADIATION DOSE REDUCTION: This exam was performed according to the departmental dose-optimization program which includes automated exposure control, adjustment of the mA and/or kV according to patient size and/or use of iterative reconstruction technique. COMPARISON:  03/05/2022. FINDINGS: Brain: There is periventricular white matter decreased attenuation consistent with small vessel ischemic changes. Ventricles, sulci and cisterns are prominent consistent with age related involutional changes. No acute intracranial hemorrhage, mass effect or shift. Ventricular prominence seems out of proportion to the extent of involutional changes which can be seen with normal pressure hydrocephalus, and this is stable finding. Vascular: No hyperdense vessel or unexpected calcification. Skull: Normal. Negative for fracture or focal lesion. Sinuses/Orbits:  No acute finding. IMPRESSION: 1. Atrophy and chronic small vessel ischemic changes. 2. Possible normal pressure hydrocephalus. 3. Otherwise no acute intracranial process identified. Electronically Signed   By: Sammie Bench M.D.   On: 04/07/2022 14:32   DG Chest 2 View  Result Date: 04/07/2022 CLINICAL DATA:  Altered mental status.  Urinary tract infection. EXAM: CHEST - 2 VIEW COMPARISON:  AP chest 01/16/2021, chest two views 11/12/2016 FINDINGS: Cardiac silhouette is at the upper limits of normal size. Mediastinal contours are within normal limits. New moderate to high-grade bilateral interstitial thickening with diffuse bilateral patchy heterogeneous airspace opacities. No definite pleural effusion. No pneumothorax. Moderate to severe multilevel degenerative disc changes of the thoracic spine. IMPRESSION: New moderate to high-grade bilateral interstitial thickening with bilateral patchy heterogeneous airspace opacities diffusely. Findings may represent pulmonary edema or multifocal pneumonia. Electronically Signed   By: Yvonne Kendall M.D.   On: 04/07/2022 14:19    EKG: I have personally reviewed EKG: sinus rhythm, LVH, LAE, RAD - no change from previous. No acute changes  Assessment/Plan Principal Problem:   Acute diastolic heart failure (HCC) Active Problems:   Chronic venous stasis dermatitis of both lower extremities   Essential hypertension   Aortic insufficiency   Anemia   Chronic cystitis   Advanced care planning/counseling discussion    Assessment and Plan: * Acute diastolic heart failure ([place]) Patient presenting with generalized weakness and fatigue. CXR reveals opacities favoring pulmonary edema vs multi-focal PNA. She has no fever, minimal elevation WBC, mildly elevated BNP. Echo 2017 revealed DD; Lynden Oxford 10/15/20 w/ nl EF, no DD noted. 5cm JVD on exam, diffuse rales worse on right.  Plan Med-surg admit  ECHO  Furosemide 20 mg IV q6 x 4 doses  D/c amolodipine  Start  coreg  Start losartan  HF protocols.  Procalcitonin to f/o PNA - please consult patient before starting any abx treatment   Chronic venous stasis dermatitis of both lower extremities Long term swelling of legs with appearance of chronic venous stasis. No open wounds. Right distal LE warm to touch  Plan Elevate legs.   Advanced care planning/counseling discussion Patient has consistently stated she does not want heroic measures: no CPR, no mechanical ventilation, no ICU care.  Plan DNR/DNI  Needs to complete MOST form  Chronic cystitis Patient recently treated for recurrent UTI - still taking abx - she thinks it is cipro. U/A in ED with many bacteria but only 6-10 WBC/hpf. Patient reports urine specimen was contaminated with stool.   No further treatment  Anemia Long h/o normocytic anemia. Last hematology visit 02/01/22 where Hgb = 9.4. She is on iron and B12.  Hgb now 8 and a contributor to weakness  Plan Serum Iron level, B12 level  Based on results may benefit from IV iron  For persistent weakness after treating HF may benefit in terms of comfort care from transfusion.  Aortic insufficiency Last Cardiology visit with Dr. Daneen Schick August '23 - she does not want any intervention or further testing. Dr. Tamala Julian wanted better BP control and started amlodipine.  Plan ECHO   Essential hypertension BP elevated at admission - 173/84.  Plan D/c amlodipine  Start Coreg  Start losartan.  Close f/u BUN/Cr       DVT prophylaxis: Lovenox Code Status: DNR/DNI(Do NOT Intubate) Family Communication: spoke with son [name] - understands working dx's and tx plan. Supports DNR/DNI decision. Would like patient to be provided MOST form  Disposition Plan: TBD  Consults called: none  Admission status: Inpatient, Telemetry bed   Adella Hare, MD [facility] 04/07/2022, 9:36 PM

## 2022-04-07 NOTE — Assessment & Plan Note (Signed)
Long term swelling of legs with appearance of chronic venous stasis. No open wounds. Right distal LE warm to touch  Plan Elevate legs.

## 2022-04-07 NOTE — Assessment & Plan Note (Signed)
Patient recently treated for recurrent UTI - still taking abx - she thinks it is cipro. U/A in ED with many bacteria but only 6-10 WBC/hpf. Patient reports urine specimen was contaminated with stool.   No further treatment

## 2022-04-07 NOTE — Assessment & Plan Note (Signed)
BP elevated at admission - 173/84.  Plan D/c amlodipine  Start Coreg  Start losartan.  Close f/u BUN/Cr

## 2022-04-07 NOTE — Assessment & Plan Note (Signed)
Last Cardiology visit with Dr. Daneen Schick August '23 - she does not want any intervention or further testing. Dr. Tamala Julian wanted better BP control and started amlodipine.  Plan ECHO

## 2022-04-07 NOTE — Assessment & Plan Note (Signed)
Patient has consistently stated she does not want heroic measures: no CPR, no mechanical ventilation, no ICU care.  Plan DNR/DNI  Needs to complete MOST form

## 2022-04-08 ENCOUNTER — Other Ambulatory Visit: Payer: Self-pay

## 2022-04-08 ENCOUNTER — Encounter (HOSPITAL_COMMUNITY): Payer: Self-pay | Admitting: Internal Medicine

## 2022-04-08 ENCOUNTER — Inpatient Hospital Stay (HOSPITAL_COMMUNITY): Payer: Medicare Other

## 2022-04-08 DIAGNOSIS — D649 Anemia, unspecified: Secondary | ICD-10-CM | POA: Diagnosis not present

## 2022-04-08 DIAGNOSIS — N3020 Other chronic cystitis without hematuria: Secondary | ICD-10-CM

## 2022-04-08 DIAGNOSIS — I10 Essential (primary) hypertension: Secondary | ICD-10-CM

## 2022-04-08 DIAGNOSIS — I872 Venous insufficiency (chronic) (peripheral): Secondary | ICD-10-CM | POA: Diagnosis not present

## 2022-04-08 DIAGNOSIS — I351 Nonrheumatic aortic (valve) insufficiency: Secondary | ICD-10-CM

## 2022-04-08 DIAGNOSIS — Z7189 Other specified counseling: Secondary | ICD-10-CM

## 2022-04-08 DIAGNOSIS — I5031 Acute diastolic (congestive) heart failure: Secondary | ICD-10-CM | POA: Diagnosis not present

## 2022-04-08 LAB — BASIC METABOLIC PANEL
Anion gap: 10 (ref 5–15)
BUN: 23 mg/dL (ref 8–23)
CO2: 26 mmol/L (ref 22–32)
Calcium: 8.4 mg/dL — ABNORMAL LOW (ref 8.9–10.3)
Chloride: 96 mmol/L — ABNORMAL LOW (ref 98–111)
Creatinine, Ser: 0.92 mg/dL (ref 0.44–1.00)
GFR, Estimated: 59 mL/min — ABNORMAL LOW (ref >60)
Glucose, Bld: 92 mg/dL (ref 70–99)
Potassium: 3.8 mmol/L (ref 3.5–5.1)
Sodium: 132 mmol/L — ABNORMAL LOW (ref 135–145)

## 2022-04-08 LAB — IRON: Iron: 17 ug/dL — ABNORMAL LOW (ref 28–170)

## 2022-04-08 LAB — RESP PANEL BY RT-PCR (RSV, FLU A&B, COVID)  RVPGX2
Influenza A by PCR: NEGATIVE
Influenza B by PCR: NEGATIVE
Resp Syncytial Virus by PCR: NEGATIVE
SARS Coronavirus 2 by RT PCR: NEGATIVE

## 2022-04-08 LAB — PROCALCITONIN: Procalcitonin: 0.3 ng/mL

## 2022-04-08 MED ORDER — SODIUM CHLORIDE 0.9 % IV SOLN
125.0000 mg | Freq: Once | INTRAVENOUS | Status: AC
  Administered 2022-04-08: 125 mg via INTRAVENOUS
  Filled 2022-04-08: qty 10

## 2022-04-08 NOTE — Hospital Course (Signed)
Ms. Ackman, a  89 year old female with a past medical history of recurrent pericarditis, hypertension, thoracic aneurysm, anemia, CKD, aortic insufficiency with preserved EF in 2022 and chronic hyponatremia presented to hospital with increasing fatigue and weakness.  She was recently treated for UTI.  In the ED patient was slightly hypertensive with bilateral lower extremity edema.  COVID influenza and RSV was negative.  Labs showed mild hyponatremia with sodium of 130 with elevated BNP at 03/19/2011.  WBC was slightly elevated at 11.5 with hemoglobin of 8.0.  Chest x-ray was abnormal showing multifocal pneumonia versus pulmonary edema.  Patient was given 40 mg of Lasix and was admitted hospital for further evaluation of possible heart failure/generalized weakness and fatigue.  Assessment and plan.  Acute diastolic heart failure (HCC)  CXR reveals opacities favoring pulmonary edema vs multi-focal PNA.  Patient without fever but with mild leukocytosis and elevated BNP.  2D echocardiogram reviewed from 10/15/2020 with normal EF.  Patient did have crackles and elevated JVD on presentation so likely diastolic heart failure at this time.  Continue IV Lasix.  Discontinue amlodipine.  Has been started on Coreg and losartan CHF protocol has been initiated.  Closely monitor creatinine levels on diuretics.  TSH of 3.4.  Check procalcitonin pending.  Chronic venous stasis dermatitis of both lower extremities Continue supportive care.  On diuretics at this time.  Mild hyponatremia.  Present on admission.  Likely secondary to hypervolemic hyponatremia.  Will monitor closely with diuresis.   Advanced care planning/counseling discussion    DNR/DNI.       Needs to complete MOST form   History of chronic cystitis Patient recently treated for recurrent UTI - still taking abx - she thinks it is cipro. U/A in ED with many bacteria but only 6-10 WBC/hpf.   Anemia Long history of normocytic anemia.  Patient follows up  with hematology as outpatient and last visit in November 2023 with hemoglobin of 9.4.  Patient continues on iron and vitamin B12.  MMA pending.  Iron pending.   Aortic insufficiency Last Cardiology visit with Dr. Daneen Schick August '23 - she does not want any intervention or further testing. Dr. Tamala Julian wanted better BP control and started amlodipine.  2D echocardiogram has been ordered.    Essential hypertension Blood pressure was elevated on presentation.  Has been started on Coreg and losartan.  Blood pressure slightly better today.  Will continue to monitor.

## 2022-04-08 NOTE — ED Notes (Signed)
ED TO INPATIENT HANDOFF REPORT  ED Nurse Name and Phone #:  Clarise Cruz RN [phone]  S Name/Age/Gender [name] 89 y.o. female Room/Bed: WA11/WA11  Code Status   Code Status: DNR  Home/SNF/Other  Patient oriented to: self, place, time, and situation Is this baseline? Yes   Triage Complete: Triage complete  Chief Complaint Acute diastolic heart failure ([place]) [I50.31]  Triage Note Pt arrives via [facility] from [facility] for AMS and UTI. Pt recently seen at PCP and diagnosed with UTI. Pt also has chronically low sodium and anemic, but both were near her baseline.    Allergies Allergies  Allergen Reactions   Cephalexin Other (See Comments)    Pt states this caused eye problems and STROKE-LIKE SYMPTOMS!!   Shellfish-Derived Products Hives and Rash   Clindamycin/Lincomycin Rash   Lincomycin Rash   Clindamycin Hcl Other (See Comments)    "Per pharmacist"   Fosfomycin Other (See Comments)    Disorientation   Levofloxacin Other (See Comments)    Reaction not recalled by the patient   Penicillamine Other (See Comments)    Reaction not recalled by the patient   Sugar-Protein-Starch Other (See Comments)    Sugary foods cause legs to cramp   Tape Other (See Comments)    Please use paper tape or Coban Wrap; patient's skin tears VERY easily!!   Ciprofloxacin Rash   Sulfa Antibiotics Hives and Rash   Sulfites Hives, Swelling and Rash    Level of Care/Admitting Diagnosis ED Disposition     ED Disposition  Admit   Condition  --   [place]: [facility] [[id number]]  Level of Care: Med-Surg [16]  May admit patient to [facility] or [facility] if equivalent level of care is available:: Yes  Covid Evaluation: Asymptomatic - no recent exposure (last 10 days) testing not required  Diagnosis: Acute diastolic heart failure ([place]) [428.31.ICD-9-CM]  Admitting Physician: Neena Rhymes [[id number]]  Attending Physician: Neena Rhymes [[id number]]   Certification:: I certify this patient will need inpatient services for at least 2 midnights  Estimated Length of Stay: 3          B Medical/Surgery History Past Medical History:  Diagnosis Date   Anemia    CHRONIC   Aortic insufficiency    a. 2D echo 08/29/15: EF 55-60%, mild LVH, diastolic dysfunction, elevated LV filling pressure, mild AI, severe LAE, mild RAE, mild TR, dilated descending thoracic aorta just distal to the takeoff of the left subclavian, measuring 4.1 cm, no pericardial effusion.   Arthritis    OA-SOME BACK AND NECK PAIN,  GOES TO CHIROPRACTOR TWICE A MONTH;  HX OF JOINT REPLACEMENTS    Cancer ([place])    SKIN CANCERS REMOVED FROM LEGS   CKD (chronic kidney disease), stage II    Depression    Essential hypertension    Gait abnormality 09/08/2017   GERD (gastroesophageal reflux disease)    Low sodium levels    Myocarditis (H[facility])    PSVT (paroxysmal supraventricular tachycardia)    Recurrent Pericarditis    RECURRENT    Thoracic aneurysm    a. Followed by [facility] -last seen in 12/2013 w/ plan for f/u MRA and thoracic surgery f/u in 2 yrs, but patient does not wish to follow any longer.   Past Surgical History:  Procedure Laterality Date   APPENDECTOMY     BREAST EXCISIONAL BIOPSY Left 1992   benign   EXCISION/RELEASE BURSA HIP Left 11/24/2012   Procedure:  LEFT HIP BURSECTOMY AND TENDON REPAIR ;  Surgeon: Gearlean Alf, MD;  Location: [facility];  Service: Orthopedics;  Laterality: Left;   HIP CLOSED REDUCTION Left 12/19/2012   Procedure: CLOSED MANIPULATION HIP;  Surgeon: Mauri Pole, MD;  Location: [facility];  Service: Orthopedics;  Laterality: Left;   HIP CLOSED REDUCTION Left 12/05/2013   Procedure: CLOSED MANIPULATION HIP;  Surgeon: Augustin Schooling, MD;  Location: [facility];  Service: Orthopedics;  Laterality: Left;   HIP CLOSED REDUCTION Left 09/22/2015   Procedure: CLOSED REDUCTION HIP;  Surgeon: Melina Schools, MD;  Location: [facility];  Service: Orthopedics;   Laterality: Left;   JOINT REPLACEMENT     LEFT TOTAL HIP REPLACEMENT AND REVISIONS X 2   OOPHORECTOMY     has a partial of one ovary remaingin   PELVIC LAPAROSCOPY     ovarian cyst removal,    REVISION TOTAL HIP ARTHROPLASTY     left   right Achilles Tendon repair  5/13   TOTAL KNEE ARTHROPLASTY     right   TOTAL KNEE ARTHROPLASTY     left   VAGINAL HYSTERECTOMY     with ovarian cyst removal     A IV Location/Drains/Wounds Patient Lines/Drains/Airways Status     Active Line/Drains/Airways     Name Placement date Placement time Site Days   Peripheral IV 04/07/22 20 G Anterior;Left Forearm 04/07/22  1644  Forearm  1            Intake/Output Last 24 hours No intake or output data in the 24 hours ending 04/08/22 1559  Labs/Imaging Results for orders placed or performed during the hospital encounter of 04/07/22 (from the past 48 hour(s))  CBG monitoring, ED     Status: Abnormal   Collection Time: 04/07/22  1:39 PM  Result Value Ref Range   Glucose-Capillary 144 (H) 70 - 99 mg/dL    Comment: Glucose reference range applies only to samples taken after fasting for at least 8 hours.   Comment 1 Notify RN   Urinalysis, Routine w reflex microscopic -Urine, Clean Catch     Status: Abnormal   Collection Time: 04/07/22  1:48 PM  Result Value Ref Range   Color, Urine YELLOW YELLOW   APPearance HAZY (A) CLEAR   Specific Gravity, Urine 1.015 1.005 - 1.030   pH 5.0 5.0 - 8.0   Glucose, UA NEGATIVE NEGATIVE mg/dL   Hgb urine dipstick SMALL (A) NEGATIVE   Bilirubin Urine NEGATIVE NEGATIVE   Ketones, ur NEGATIVE NEGATIVE mg/dL   Protein, ur 100 (A) NEGATIVE mg/dL   Nitrite NEGATIVE NEGATIVE   Leukocytes,Ua NEGATIVE NEGATIVE   RBC / HPF 6-10 0 - 5 RBC/hpf   WBC, UA 6-10 0 - 5 WBC/hpf   Bacteria, UA MANY (A) NONE SEEN   Squamous Epithelial / HPF 0-5 0 - 5 /HPF   Mucus PRESENT    Hyaline Casts, UA PRESENT     Comment: Performed at [facility], [place]  [address], [place], [place] [address]  CBC     Status: Abnormal   Collection Time: 04/07/22  4:40 PM  Result Value Ref Range   WBC 11.5 (H) 4.0 - 10.5 K/uL   RBC 2.98 (L) 3.87 - 5.11 MIL/uL   Hemoglobin 8.0 (L) 12.0 - 15.0 g/dL   HCT 25.7 (L) 36.0 - 46.0 %   MCV 86.2 80.0 - 100.0 fL   MCH 26.8 26.0 - 34.0 pg   MCHC 31.1 30.0 - 36.0  g/dL   RDW 15.8 (H) 11.5 - 15.5 %   Platelets 340 150 - 400 K/uL   nRBC 0.0 0.0 - 0.2 %    Comment: Performed at [facility], [place] [address] [place], [place] [address]  Comprehensive metabolic panel     Status: Abnormal   Collection Time: 04/07/22  4:40 PM  Result Value Ref Range   Sodium 130 (L) 135 - 145 mmol/L   Potassium 4.6 3.5 - 5.1 mmol/L   Chloride 102 98 - 111 mmol/L   CO2 22 22 - 32 mmol/L   Glucose, Bld 98 70 - 99 mg/dL    Comment: Glucose reference range applies only to samples taken after fasting for at least 8 hours.   BUN 25 (H) 8 - 23 mg/dL   Creatinine, Ser 0.87 0.44 - 1.00 mg/dL   Calcium 8.2 (L) 8.9 - 10.3 mg/dL   Total Protein 7.1 6.5 - 8.1 g/dL   Albumin 3.1 (L) 3.5 - 5.0 g/dL   AST 44 (H) 15 - 41 U/L   ALT 30 0 - 44 U/L   Alkaline Phosphatase 89 38 - 126 U/L   Total Bilirubin 0.4 0.3 - 1.2 mg/dL   GFR, Estimated >60 >60 mL/min    Comment: (NOTE) Calculated using the CKD-EPI Creatinine Equation (2021)    Anion gap 6 5 - 15    Comment: Performed at [facility], [place] [address], [place], [place] [address]  Lactic acid, plasma     Status: None   Collection Time: 04/07/22  4:40 PM  Result Value Ref Range   Lactic Acid, Venous 0.9 0.5 - 1.9 mmol/L    Comment: Performed at [facility], [place] [address], [place], [place] [address]  TSH     Status: None   Collection Time: 04/07/22  4:40 PM  Result Value Ref Range   TSH 3.479 0.350 - 4.500 uIU/mL    Comment: Performed by a 3rd Generation assay with a functional sensitivity of <=0.01 uIU/mL. Performed at [facility], [place] [address], [place], [place] [address]   Brain natriuretic peptide     Status: Abnormal   Collection Time: 04/07/22  4:40 PM  Result Value Ref Range   B Natriuretic Peptide 413.6 (H) 0.0 - 100.0 pg/mL    Comment: Performed at [facility], [place] [address], [place],  [address]  Troponin I (High Sensitivity)     Status: None   Collection Time: 04/07/22  4:40 PM  Result Value Ref Range   Troponin I (High Sensitivity) 11 <18 ng/L    Comment: (NOTE) Elevated high sensitivity troponin I (hsTnI) values and significant  changes across serial measurements may suggest ACS but many other  chronic and acute conditions are known to elevate hsTnI results.  Refer to the "Links" section for chest pain algorithms and additional  guidance. Performed at [facility], [place] [address] [place], [place] [address]   Lactic acid, plasma     Status: None   Collection Time: 04/07/22  6:50 PM  Result Value Ref Range   Lactic Acid, Venous 0.8 0.5 - 1.9 mmol/L    Comment: Performed at [facility], [place] [address] [place], [place] [address]  Troponin I (High Sensitivity)     Status: None   Collection Time: 04/07/22  6:50 PM  Result Value Ref Range   Troponin I (High Sensitivity) 14 <18 ng/L    Comment: (NOTE) Elevated  high sensitivity troponin I (hsTnI) values and significant  changes across serial measurements may suggest ACS but many other  chronic and acute conditions are known to elevate hsTnI results.  Refer to the "Links" section for chest pain algorithms and additional  guidance. Performed at [facility], [place] [address] [place], [place] [address]   Resp panel by RT-PCR (RSV, Flu A&B, Covid) Anterior Nasal Swab     Status: None   Collection Time: 04/07/22  9:06 PM   Specimen: Anterior Nasal Swab  Result Value Ref Range   SARS Coronavirus 2 by RT PCR NEGATIVE NEGATIVE    Comment:  (NOTE) SARS-CoV-2 target nucleic acids are NOT DETECTED.  The SARS-CoV-2 RNA is generally detectable in upper respiratory specimens during the acute phase of infection. The lowest concentration of SARS-CoV-2 viral copies this assay can detect is 138 copies/mL. A negative result does not preclude SARS-Cov-2 infection and should not be used as the sole basis for treatment or other patient management decisions. A negative result may occur with  improper specimen collection/handling, submission of specimen other than nasopharyngeal swab, presence of viral mutation(s) within the areas targeted by this assay, and inadequate number of viral copies(<138 copies/mL). A negative result must be combined with clinical observations, patient history, and epidemiological information. The expected result is Negative.  Fact Sheet for Patients:  EntrepreneurPulse.com.au  Fact Sheet for Healthcare Providers:  IncredibleEmployment.be  This test is no t yet approved or cleared by the [place] FDA and  has been authorized for detection and/or diagnosis of SARS-CoV-2 by FDA under an Emergency Use Authorization (EUA). This EUA will remain  in effect (meaning this test can be used) for the duration of the COVID-19 declaration under Section 564(b)(1) of the Act, 21 U.S.C.section 360bbb-3(b)(1), unless the authorization is terminated  or revoked sooner.       Influenza A by PCR NEGATIVE NEGATIVE   Influenza B by PCR NEGATIVE NEGATIVE    Comment: (NOTE) The Xpert Xpress SARS-CoV-2/FLU/RSV plus assay is intended as an aid in the diagnosis of influenza from Nasopharyngeal swab specimens and should not be used as a sole basis for treatment. Nasal washings and aspirates are unacceptable for Xpert Xpress SARS-CoV-2/FLU/RSV testing.  Fact Sheet for Patients: EntrepreneurPulse.com.au  Fact Sheet for Healthcare  Providers: IncredibleEmployment.be  This test is not yet approved or cleared by the [place] FDA and has been authorized for detection and/or diagnosis of SARS-CoV-2 by FDA under an Emergency Use Authorization (EUA). This EUA will remain in effect (meaning this test can be used) for the duration of the COVID-19 declaration under Section 564(b)(1) of the Act, 21 U.S.C. section 360bbb-3(b)(1), unless the authorization is terminated or revoked.     Resp Syncytial Virus by PCR NEGATIVE NEGATIVE    Comment: (NOTE) Fact Sheet for Patients: EntrepreneurPulse.com.au  Fact Sheet for Healthcare Providers: IncredibleEmployment.be  This test is not yet approved or cleared by the [place] FDA and has been authorized for detection and/or diagnosis of SARS-CoV-2 by FDA under an Emergency Use Authorization (EUA). This EUA will remain in effect (meaning this test can be used) for the duration of the COVID-19 declaration under Section 564(b)(1) of the Act, 21 U.S.C. section 360bbb-3(b)(1), unless the authorization is terminated or revoked.  Performed at [facility], [place] [address] [place], [place] [address]   Basic metabolic panel     Status: Abnormal   Collection Time: 04/08/22  6:16 AM  Result Value Ref Range   Sodium  132 (L) 135 - 145 mmol/L   Potassium 3.8 3.5 - 5.1 mmol/L   Chloride 96 (L) 98 - 111 mmol/L   CO2 26 22 - 32 mmol/L   Glucose, Bld 92 70 - 99 mg/dL    Comment: Glucose reference range applies only to samples taken after fasting for at least 8 hours.   BUN 23 8 - 23 mg/dL   Creatinine, Ser 0.92 0.44 - 1.00 mg/dL   Calcium 8.4 (L) 8.9 - 10.3 mg/dL   GFR, Estimated 59 (L) >60 mL/min    Comment: (NOTE) Calculated using the CKD-EPI Creatinine Equation (2021)    Anion gap 10 5 - 15    Comment: Performed at [facility], [place] [address] [place], [place] [address]   CT Head  Wo Contrast  Result Date: 04/07/2022 CLINICAL DATA:  [place] AMS EXAM: CT HEAD WITHOUT CONTRAST TECHNIQUE: Contiguous axial images were obtained from the base of the skull through the vertex without intravenous contrast. RADIATION DOSE REDUCTION: This exam was performed according to the departmental dose-optimization program which includes automated exposure control, adjustment of the mA and/or kV according to patient size and/or use of iterative reconstruction technique. COMPARISON:  03/05/2022. FINDINGS: Brain: There is periventricular white matter decreased attenuation consistent with small vessel ischemic changes. Ventricles, sulci and cisterns are prominent consistent with age related involutional changes. No acute intracranial hemorrhage, mass effect or shift. Ventricular prominence seems out of proportion to the extent of involutional changes which can be seen with normal pressure hydrocephalus, and this is stable finding. Vascular: No hyperdense vessel or unexpected calcification. Skull: Normal. Negative for fracture or focal lesion. Sinuses/Orbits: No acute finding. IMPRESSION: 1. Atrophy and chronic small vessel ischemic changes. 2. Possible normal pressure hydrocephalus. 3. Otherwise no acute intracranial process identified. Electronically Signed   By: Sammie Bench M.D.   On: 04/07/2022 14:32   DG Chest 2 View  Result Date: 04/07/2022 CLINICAL DATA:  Altered mental status.  Urinary tract infection. EXAM: CHEST - 2 VIEW COMPARISON:  AP chest 01/16/2021, chest two views 11/12/2016 FINDINGS: Cardiac silhouette is at the upper limits of normal size. Mediastinal contours are within normal limits. New moderate to high-grade bilateral interstitial thickening with diffuse bilateral patchy heterogeneous airspace opacities. No definite pleural effusion. No pneumothorax. Moderate to severe multilevel degenerative disc changes of the thoracic spine. IMPRESSION: New moderate to high-grade bilateral  interstitial thickening with bilateral patchy heterogeneous airspace opacities diffusely. Findings may represent pulmonary edema or multifocal pneumonia. Electronically Signed   By: Yvonne Kendall M.D.   On: 04/07/2022 14:19    Pending Labs Unresulted Labs (From admission, onward)     Start     Ordered   04/14/22 0500  Creatinine, serum  (enoxaparin (LOVENOX)    CrCl >/= 30 ml/min)  Weekly,   R     Comments: while on enoxaparin therapy    04/07/22 2106   04/08/22 6213  Basic metabolic panel  Daily,   R     Comments: As Scheduled for 5 days    04/07/22 2106   04/07/22 2106  Procalcitonin - Baseline  ONCE - URGENT,   URGENT        04/07/22 2106   04/07/22 2103  Iron  Add-on,   AD        04/07/22 2106   04/07/22 2103  Methylmalonic acid, serum  Add-on,   AD        04/07/22 2106  Vitals/Pain Today's Vitals   04/08/22 1156 04/08/22 1445 04/08/22 1500 04/08/22 1537  BP:  114/62 125/65   Pulse:  86 87   Resp:  18    Temp: 98.1 F (36.7 C)   98.1 F (36.7 C)  TempSrc: Oral   Oral  SpO2:  95% 94%   PainSc:        Isolation Precautions No active isolations  Medications Medications  sodium chloride flush (NS) 0.9 % injection 3 mL (3 mLs Intravenous Not Given 04/08/22 1229)  sodium chloride flush (NS) 0.9 % injection 3 mL (has no administration in time range)  0.9 %  sodium chloride infusion (has no administration in time range)  acetaminophen (TYLENOL) tablet 650 mg (has no administration in time range)  ondansetron (ZOFRAN) injection 4 mg (has no administration in time range)  enoxaparin (LOVENOX) injection 40 mg (40 mg Subcutaneous Given 04/07/22 2230)  potassium chloride (KLOR-CON M) CR tablet 10 mEq (10 mEq Oral Given 04/08/22 1057)  losartan (COZAAR) tablet 25 mg (25 mg Oral Given 04/08/22 1056)  carvedilol (COREG) tablet 3.125 mg (3.125 mg Oral Given 04/08/22 0824)  zolpidem (AMBIEN) tablet 5 mg (5 mg Oral Given 04/07/22 2229)  ferric gluconate (FERRLECIT) 125 mg  in sodium chloride 0.9 % 100 mL IVPB (has no administration in time range)  furosemide (LASIX) injection 40 mg (40 mg Intravenous Given 04/07/22 2102)  furosemide (LASIX) injection 20 mg (20 mg Intravenous Given 04/08/22 1424)    Mobility walks with device     Focused Assessments    R Recommendations: See Admitting Provider Note  Report given to:   Additional Notes:

## 2022-04-08 NOTE — Progress Notes (Signed)
PROGRESS NOTE    [name]  BTD:[id number] DOB: 08-27-1929 DOA: 04/07/2022 PCP: Kathalene Frames, MD    Brief Narrative:  [name], a  89 year old female with a past medical history of recurrent pericarditis, hypertension, thoracic aneurysm, anemia, CKD, aortic insufficiency with preserved EF in 2022 and chronic hyponatremia presented to hospital with increasing fatigue and weakness.  She was recently treated for UTI.  In the ED patient was slightly hypertensive with bilateral lower extremity edema.  COVID influenza and RSV was negative.  Labs showed mild hyponatremia with sodium of 130 with elevated BNP at 03/19/2011.  WBC was slightly elevated at 11.5 with hemoglobin of 8.0.  Chest x-ray was abnormal showing multifocal pneumonia versus pulmonary edema.  Patient was given 40 mg of Lasix and was admitted hospital for further evaluation of possible heart failure/generalized weakness and fatigue.  Assessment and plan.  Acute diastolic heart failure (HCC)  CXR reveals opacities favoring pulmonary edema vs multi-focal PNA.  Patient without fever but with mild leukocytosis and elevated BNP being more in favor of pulmonary congestion/CHF.  2D echocardiogram reviewed from 10/15/2020 with normal EF.  Patient did have crackles and elevated JVD on presentation so likely diastolic heart failure at this time.  Continue IV Lasix.  Discontinue amlodipine.  Has been started on Coreg and losartan. CHF protocol has been initiated.  Closely monitor creatinine levels on diuretics.  TSH of 3.4.  Check procalcitonin pending.  Denies previous history of congestive heart failure.  Check new 2D echocardiogram.  Chronic venous stasis dermatitis of both lower extremities Continue supportive care.  On diuretics at this time.  Mild hyponatremia.  Present on admission.  Likely secondary to hypervolemic hyponatremia.  Will monitor closely with diuresis. Level today.   Advanced care planning/counseling  discussion    DNR/DNI.       Needs to complete MOST form, will get palliative care consultation.   History of chronic cystitis Patient recently treated for recurrent UTI . U/A in ED with many bacteria but only 6-10 WBC/hpf.  Asymptomatic at this time.  Was recently treated with ciprofloxacin.   Anemia Long history of normocytic anemia.  Patient follows up with hematology as outpatient and last visit in November 2023 with hemoglobin of 9.4.  Patient continues on iron and vitamin B12.  MMA pending.  Iron pending increasing fatigue weakness and possible congestive heart failure will transfuse iron today.   Aortic insufficiency Last Cardiology visit with Dr. Daneen Schick August '23 - she does not want any intervention or further testing. Dr. Tamala Julian wanted better BP control and started amlodipine.  2D echocardiogram has been ordered.  Off amlodipine.  Will check 2D echocardiogram.    Essential hypertension Blood pressure was elevated on presentation.  Has been started on Coreg and losartan.  Blood pressure slightly better today.  Will continue to monitor.          DVT prophylaxis: enoxaparin (LOVENOX) injection 40 mg Start: 04/07/22 2200   Code Status:     Code Status: DNR  Disposition: Currently living at the [facility] independent living facility.  Likely disposition in 1 to 2 days.  Status is: Inpatient Remains inpatient appropriate because: Congestive heart failure, anemia,   Family Communication: None at bedside.  Patient has son and daughter out of state.  Consultants:  None  Procedures:  None yet  Antimicrobials:  None  Anti-infectives (From admission, onward)    None       Subjective: Today, patient was seen and examined at  bedside.  Complains of fatigue and tiredness.  Denies any chest pain, dizziness, lightheadedness or increasing shortness of breath.  No nausea vomiting or diarrhea.  Denies any blood loss.  Objective: Vitals:   04/08/22 1015 04/08/22 1030  04/08/22 1130 04/08/22 1156  BP:   (!) 140/61   Pulse: 81 100 79   Resp:   18   Temp:    98.1 F (36.7 C)  TempSrc:    Oral  SpO2: 100% 91% 98%    No intake or output data in the 24 hours ending 04/08/22 1308 There were no vitals filed for this visit.  Physical Examination: There is no height or weight on file to calculate BMI.  General:  Average built, not in obvious distress, alert awake and Communicative, HENT: Mild pallor noted oral mucosa is moist.  Chest:  Clear breath sounds.  Diminished breath sounds bilaterally. No crackles or wheezes.  CVS: S1 &S2 heard. No murmur.  Regular rate and rhythm. Abdomen: Soft, nontender, nondistended.  Bowel sounds are heard.   Extremities: No cyanosis, clubbing or edema.  Peripheral pulses are palpable. Psych: Alert, awake and oriented, normal mood CNS:  No cranial nerve deficits.  Power equal in all extremities.   Skin: Warm and dry.  No rashes noted.  Data Reviewed:   CBC: Recent Labs  Lab 04/07/22 1640  WBC 11.5*  HGB 8.0*  HCT 25.7*  MCV 86.2  PLT 267    Basic Metabolic Panel: Recent Labs  Lab 04/07/22 1640 04/08/22 0616  NA 130* 132*  K 4.6 3.8  CL 102 96*  CO2 22 26  GLUCOSE 98 92  BUN 25* 23  CREATININE 0.87 0.92  CALCIUM 8.2* 8.4*    Liver Function Tests: Recent Labs  Lab 04/07/22 1640  AST 44*  ALT 30  ALKPHOS 89  BILITOT 0.4  PROT 7.1  ALBUMIN 3.1*     Radiology Studies: CT Head Wo Contrast  Result Date: 04/07/2022 CLINICAL DATA:  [place] AMS EXAM: CT HEAD WITHOUT CONTRAST TECHNIQUE: Contiguous axial images were obtained from the base of the skull through the vertex without intravenous contrast. RADIATION DOSE REDUCTION: This exam was performed according to the departmental dose-optimization program which includes automated exposure control, adjustment of the mA and/or kV according to patient size and/or use of iterative reconstruction technique. COMPARISON:  03/05/2022. FINDINGS: Brain: There is  periventricular white matter decreased attenuation consistent with small vessel ischemic changes. Ventricles, sulci and cisterns are prominent consistent with age related involutional changes. No acute intracranial hemorrhage, mass effect or shift. Ventricular prominence seems out of proportion to the extent of involutional changes which can be seen with normal pressure hydrocephalus, and this is stable finding. Vascular: No hyperdense vessel or unexpected calcification. Skull: Normal. Negative for fracture or focal lesion. Sinuses/Orbits: No acute finding. IMPRESSION: 1. Atrophy and chronic small vessel ischemic changes. 2. Possible normal pressure hydrocephalus. 3. Otherwise no acute intracranial process identified. Electronically Signed   By: Sammie Bench M.D.   On: 04/07/2022 14:32   DG Chest 2 View  Result Date: 04/07/2022 CLINICAL DATA:  Altered mental status.  Urinary tract infection. EXAM: CHEST - 2 VIEW COMPARISON:  AP chest 01/16/2021, chest two views 11/12/2016 FINDINGS: Cardiac silhouette is at the upper limits of normal size. Mediastinal contours are within normal limits. New moderate to high-grade bilateral interstitial thickening with diffuse bilateral patchy heterogeneous airspace opacities. No definite pleural effusion. No pneumothorax. Moderate to severe multilevel degenerative disc changes of the thoracic spine. IMPRESSION: New  moderate to high-grade bilateral interstitial thickening with bilateral patchy heterogeneous airspace opacities diffusely. Findings may represent pulmonary edema or multifocal pneumonia. Electronically Signed   By: Yvonne Kendall M.D.   On: 04/07/2022 14:19      LOS: 1 day    Flora Lipps, MD [facility] Available via Epic secure chat 7am-7pm After these hours, please refer to coverage provider listed on [email or web address] 04/08/2022, 1:08 PM

## 2022-04-09 ENCOUNTER — Inpatient Hospital Stay (HOSPITAL_COMMUNITY): Payer: Medicare Other

## 2022-04-09 ENCOUNTER — Encounter: Payer: Self-pay | Admitting: Hematology

## 2022-04-09 DIAGNOSIS — I5031 Acute diastolic (congestive) heart failure: Secondary | ICD-10-CM | POA: Diagnosis not present

## 2022-04-09 DIAGNOSIS — I872 Venous insufficiency (chronic) (peripheral): Secondary | ICD-10-CM | POA: Diagnosis not present

## 2022-04-09 DIAGNOSIS — Z66 Do not resuscitate: Secondary | ICD-10-CM

## 2022-04-09 DIAGNOSIS — Z515 Encounter for palliative care: Secondary | ICD-10-CM | POA: Diagnosis not present

## 2022-04-09 DIAGNOSIS — D649 Anemia, unspecified: Secondary | ICD-10-CM | POA: Diagnosis not present

## 2022-04-09 DIAGNOSIS — Z7189 Other specified counseling: Secondary | ICD-10-CM | POA: Diagnosis not present

## 2022-04-09 LAB — ECHOCARDIOGRAM COMPLETE
Area-P 1/2: 4.21 cm2
Calc EF: 50.3 %
Height: 62 in
P 1/2 time: 320 msec
S' Lateral: 3.7 cm
Single Plane A2C EF: 45.6 %
Single Plane A4C EF: 54.6 %
Weight: 2240 oz

## 2022-04-09 LAB — BASIC METABOLIC PANEL
Anion gap: 11 (ref 5–15)
BUN: 34 mg/dL — ABNORMAL HIGH (ref 8–23)
CO2: 28 mmol/L (ref 22–32)
Calcium: 8.2 mg/dL — ABNORMAL LOW (ref 8.9–10.3)
Chloride: 93 mmol/L — ABNORMAL LOW (ref 98–111)
Creatinine, Ser: 1.16 mg/dL — ABNORMAL HIGH (ref 0.44–1.00)
GFR, Estimated: 45 mL/min — ABNORMAL LOW (ref >60)
Glucose, Bld: 97 mg/dL (ref 70–99)
Potassium: 3.6 mmol/L (ref 3.5–5.1)
Sodium: 132 mmol/L — ABNORMAL LOW (ref 135–145)

## 2022-04-09 MED ORDER — HYDROCORTISONE 0.5 % EX CREA
TOPICAL_CREAM | Freq: Four times a day (QID) | CUTANEOUS | Status: DC | PRN
  Filled 2022-04-09 (×2): qty 28.35

## 2022-04-09 MED ORDER — FUROSEMIDE 10 MG/ML IJ SOLN
20.0000 mg | Freq: Once | INTRAMUSCULAR | Status: AC
  Administered 2022-04-10: 20 mg via INTRAVENOUS
  Filled 2022-04-09: qty 2

## 2022-04-09 NOTE — Progress Notes (Signed)
Heart Failure Navigator Progress Note  Assessed for Heart & Vascular TOC clinic readiness.  Patient EF 60-65 %, Palliative care consult. .   Navigator available for reassessment of patient.   Earnestine Leys, BSN, Clinical cytogeneticist Only

## 2022-04-09 NOTE — Consult Note (Signed)
Palliative Care Consult Note                                  Date: 04/09/2022   Patient Name: [name]  DOB: [birth year]  MRN: [id number]  Age / Sex: 89 y.o., female  PCP: Kathalene Frames, MD Referring Physician: Flora Lipps, MD  Reason for Consultation: Establishing goals of care  HPI/Patient Profile: 89 y.o. female  with past medical history of recurrent pericarditis, thoracic aneurysm, aortic insufficiency with preserved EF, CKD, chronic hyponatremia, anemia, and hypertension who presented to the ED on 04/07/2022 with progressive fatigue and weakness.  In the ED, patient was slightly hypertensive with bilateral lower extremity edema.  Chest x-ray was abnormal showing pulmonary edema versus multifocal pneumonia.  Patient was given IV Lasix and admitted to [facility] service for evaluation of possible acute heart failure.  Past Medical History:  Diagnosis Date   Anemia    CHRONIC   Aortic insufficiency    a. 2D echo 08/29/15: EF 55-60%, mild LVH, diastolic dysfunction, elevated LV filling pressure, mild AI, severe LAE, mild RAE, mild TR, dilated descending thoracic aorta just distal to the takeoff of the left subclavian, measuring 4.1 cm, no pericardial effusion.   Arthritis    OA-SOME BACK AND NECK PAIN,  GOES TO CHIROPRACTOR TWICE A MONTH;  HX OF JOINT REPLACEMENTS    Cancer ([place])    SKIN CANCERS REMOVED FROM LEGS   CKD (chronic kidney disease), stage II    Depression    Essential hypertension    Gait abnormality 09/08/2017   GERD (gastroesophageal reflux disease)    Low sodium levels    Myocarditis (HCC)    PSVT (paroxysmal supraventricular tachycardia)    Recurrent Pericarditis    RECURRENT    Thoracic aneurysm    a. Followed by [facility] -last seen in 12/2013 w/ plan for f/u MRA and thoracic surgery f/u in 2 yrs, but patient does not wish to follow any longer.    Subjective:   I have reviewed medical records including  progress notes, labs and imaging, received report from the team, and met with patient at bedside to discuss diagnosis, prognosis, GOC, EOL wishes, disposition, and options.  She is anxious to go home and does not like being in the hospital.  I introduced Palliative Medicine as specialized medical care for people living with serious illness. It focuses on providing relief from the symptoms and stress of a serious illness.   Brief life review was discussed.  [place] is originally from [place].  She relocated to [place] many years ago for her husband's job.  He passed away 3 years ago.  She has 1 son Alveta Heimlich who lives in [place] and 1 daughter [name] who lives in [place].  Patient resides at [facility], [place].  She is completely independent with ADLs.  She attends exercise classes every morning and walks around the block in the afternoon.  We discussed patient's current illness and what it means in the larger context of her ongoing co-morbidities. Current clinical status was reviewed. Natural disease trajectory of *** was discussed.  Created space and opportunity for patient and family to explore thoughts and feelings regarding current medical situation.  Values and goals of care important to patient and family were attempted to be elicited.  [place] states "I'm old. When it's my time just let me go. I would go now if I could".  She is clear that she does not want any artificial interventions to prolong her life.  A discussion was had today regarding advanced directives. Concepts specific to code status, artifical feeding and hydration, continued IV antibiotics and rehospitalization was had.    I completed a MOST form today. The patient outlined her wishes for the following treatment decisions:  Cardiopulmonary Resuscitation: Do Not Attempt Resuscitation (DNR/No CPR)  Medical Interventions: Comfort Measures: Keep clean, warm, and dry. Use medication by any route, positioning, wound  care, and other measures to relieve pain and suffering. Use oxygen, suction and manual treatment of airway obstruction as needed for comfort. Do not transfer to the hospital unless comfort needs cannot be met in current location.  Antibiotics: No antibiotics (use other measures to relieve symptoms)  IV Fluids: No IV fluids (provide other measures to ensure comfort)  Feeding Tube: No feeding tube     Questions and concerns addressed. Patient/family encouraged to call with questions or concerns.     Life Review: ***  Functional Status: ***  Patient/Family Understanding of Illness: ***  Patient Values: ***  Goals: ***  Additional Discussion: ***  Review of Systems  Constitutional:  Positive for fatigue.  Cardiovascular:  Positive for leg swelling.    Objective:   Primary Diagnoses: Present on Admission:  Essential hypertension  Aortic insufficiency  Anemia  Chronic cystitis  Acute diastolic heart failure (HCC)  Chronic venous stasis dermatitis of both lower extremities   Physical Exam  Vital Signs:  BP (!) 157/79 (BP Location: Left Arm)   Pulse 85   Temp 98.4 F (36.9 C) (Oral)   Resp 14   Ht '5\' 2"'$  (1.575 m)   Wt 63.5 kg   SpO2 95%   BMI 25.61 kg/m   Palliative Assessment/Data: ***     Assessment & Plan:   SUMMARY OF RECOMMENDATIONS   DNR/DNI   Primary Decision Maker: {Primary Decision UUEKC:00349}  Code Status/Advance Care Planning: {Palliative Code status:23503}  Symptom Management:  ***  Prognosis:  {Palliative Care Prognosis:23504}  Discharge Planning:  {Palliative dispostion:23505}   Discussed with: ***    Thank you for allowing [place] to participate in the care of [place]   Time Total: ***  Greater than 50%  of this time was spent counseling and coordinating care related to the above assessment and plan.  Signed by: Elie Confer, NP Palliative Medicine Team  Team Phone # [phone]  For individual  providers, please see AMION

## 2022-04-09 NOTE — Progress Notes (Signed)
PROGRESS NOTE    [name]  JOA:[id number] DOB: [birth year] DOA: 04/07/2022 PCP: Kathalene Frames, MD    Brief Narrative:  [name], a  89 year old female with a past medical history of recurrent pericarditis, hypertension, thoracic aneurysm, anemia, CKD, aortic insufficiency with preserved EF in 2022 and chronic hyponatremia presented to hospital with increasing fatigue and weakness.  She was recently treated for UTI.  In the ED, patient was slightly hypertensive with bilateral lower extremity edema.  COVID influenza and RSV was negative.  Labs showed mild hyponatremia with sodium of 130 with elevated BNP at 03/19/2011.  WBC was slightly elevated at 11.5 with hemoglobin of 8.0.  Chest x-ray was abnormal showing multifocal pneumonia versus pulmonary edema.  Patient was given 40 mg of Lasix and was admitted hospital for further evaluation of possible heart failure/generalized weakness and fatigue.  Assessment and plan.  Acute diastolic heart failure (HCC)  CXR revealed opacities favoring pulmonary edema vs multi-focal PNA.  Patient without fever but with mild leukocytosis and elevated BNP being more in favor of pulmonary congestion/CHF.  2D echocardiogram reviewed from 10/15/2020 with normal EF.  Patient did have crackles and elevated JVD on presentation so likely diastolic heart failure at this time.  Patient received IV Lasix during hospitalization.  Discontinued amlodipine. Patient has been started on Coreg and losartan. Denies previous history of congestive heart failure. Pending 2 D echo. Procal<0.3  Chronic venous stasis dermatitis of both lower extremities Continue supportive care.  On diuretics at this time.  Mild hyponatremia.  Present on admission.  Likely secondary to hypervolemic hyponatremia.  Improved some.   Advanced care planning/counseling discussion    DNR/DNI.    Consulted palliative care   History of chronic cystitis Patient recently treated for recurrent UTI .  Urinalysis in ED with many bacteria but only 6-10 WBC/hpf.  Asymptomatic at this time. Patient was recently treated with ciprofloxacin.   Anemia Long history of normocytic anemia.  Patient follows up with hematology as outpatient and last visit in November 2023 with hemoglobin of 9.4.  Patient continues on iron and vitamin B12.  MMA pending.  Iron low at 17 and received a dose of ferriclet '125mg'$ .  Has been seen by physical therapy who recommend home PT on discharge.  Aortic insufficiency Last Cardiology visit with Dr. Daneen Schick August '23 - she does not want any intervention or further testing. Dr. Tamala Julian wanted better BP control and started amlodipine.  2D echocardiogram has been ordered.  Off amlodipine.  2D echocardiogram pending from today.    Essential hypertension Blood pressure was elevated on presentation.  Has been started on Coreg and losartan.  Blood pressure slightly better today.  Will continue to monitor.          DVT prophylaxis: enoxaparin (LOVENOX) injection 40 mg Start: 04/07/22 2200   Code Status:     Code Status: DNR  Disposition:  Currently at [facility] facility.  Likely disposition on 04/10/2022.  Status is: Inpatient  Remains inpatient appropriate because: Congestive heart failure, anemia,   Family Communication:  None at bedside.  Patient has son and daughter out of state.  Patient's son and daughter-in-law wishing her to go to go to higher level of care if possible.  TOC on board  Consultants:  None  Procedures:  None yet  Antimicrobials:  None  Anti-infectives (From admission, onward)    None       Subjective: Today, patient was seen and examined at bedside.  Still  complains of mild fatigue and weakness but little better than yesterday.  No nausea vomiting fever chills or rigor.    Objective: Vitals:   04/09/22 0139 04/09/22 0522 04/09/22 1008 04/09/22 1313  BP: (!) 155/66 (!) 167/75  (!) 157/79  Pulse: 88 90 94 85  Resp:  '18 18  14  '$ Temp: 98.4 F (36.9 C) (!) 97.5 F (36.4 C)  98.4 F (36.9 C)  TempSrc: Oral Oral  Oral  SpO2: 92% 95% 96% 95%  Weight:      Height:        Intake/Output Summary (Last 24 hours) at 04/09/2022 1606 Last data filed at 04/09/2022 0316 Gross per 24 hour  Intake 110 ml  Output --  Net 110 ml   Filed Weights   04/08/22 1806  Weight: 63.5 kg    Physical Examination: Body mass index is 25.61 kg/m.   General:  Average built, not in obvious distress, alert awake, Communicative, HENT: Mild pallor noted, oral mucosa is moist.  Chest:    Diminished breath sounds bilaterally. No crackles or wheezes.  CVS: S1 &S2 heard. No murmur.  Regular rate and rhythm. Abdomen: Soft, nontender, nondistended.  Bowel sounds are heard.   Extremities: No cyanosis, clubbing with bilateral venous stasis. Peripheral pulses are palpable. Psych: Alert, awake and oriented, normal mood CNS:  No cranial nerve deficits.  Power equal in all extremities.   Skin: Warm and dry.  No rashes noted.  Data Reviewed:   CBC: Recent Labs  Lab 04/07/22 1640  WBC 11.5*  HGB 8.0*  HCT 25.7*  MCV 86.2  PLT 340     Basic Metabolic Panel: Recent Labs  Lab 04/07/22 1640 04/08/22 0616 04/09/22 0554  NA 130* 132* 132*  K 4.6 3.8 3.6  CL 102 96* 93*  CO2 '22 26 28  '$ GLUCOSE 98 92 97  BUN 25* 23 34*  CREATININE 0.87 0.92 1.16*  CALCIUM 8.2* 8.4* 8.2*     Liver Function Tests: Recent Labs  Lab 04/07/22 1640  AST 44*  ALT 30  ALKPHOS 89  BILITOT 0.4  PROT 7.1  ALBUMIN 3.1*      Radiology Studies: ECHOCARDIOGRAM COMPLETE  Result Date: 04/09/2022    ECHOCARDIOGRAM REPORT   Patient Name:   [name] Date of Exam: 04/09/2022 Medical Rec #:  [id number]         Height:       62.0 in Accession #:    [id number]        Weight:       140.0 lb Date of Birth:  [birth year]        BSA:          1.643 m Patient Age:    26 years          BP:           167/75 mmHg Patient Gender: F                 HR:            98 bpm. Exam Location:  Inpatient Procedure: 2D Echo, Cardiac Doppler and Color Doppler Indications:    CHF-Acute Diastolic Y18.56  History:        Patient has prior history of Echocardiogram examinations, most                 recent 10/15/2020. AI; Risk Factors:Hypertension and GERD.  Sonographer:    Bernadene Person RDCS Referring Phys: Startup  1. Left ventricular ejection fraction, by estimation, is 60 to 65%. The left ventricle has normal function. The left ventricle has no regional wall motion abnormalities. Left ventricular diastolic parameters are consistent with Grade II diastolic dysfunction (pseudonormalization). Elevated left ventricular end-diastolic pressure.  2. Right ventricular systolic function is normal. The right ventricular size is normal. There is normal pulmonary artery systolic pressure.  3. Left atrial size was severely dilated.  4. The mitral valve is abnormal. Trivial mitral valve regurgitation. No evidence of mitral stenosis.  5. The aortic valve is tricuspid. There is mild calcification of the aortic valve. There is mild thickening of the aortic valve. Aortic valve regurgitation is mild to moderate. Aortic valve sclerosis is present, with no evidence of aortic valve stenosis.  6. Aortic dilatation noted. There is mild dilatation of the aortic root, measuring 38 mm. There is moderate dilatation of the ascending aorta, measuring 41 mm.  7. The inferior vena cava is normal in size with greater than 50% respiratory variability, suggesting right atrial pressure of 3 mmHg. FINDINGS  Left Ventricle: Left ventricular ejection fraction, by estimation, is 60 to 65%. The left ventricle has normal function. The left ventricle has no regional wall motion abnormalities. The left ventricular internal cavity size was normal in size. There is  no left ventricular hypertrophy. Left ventricular diastolic parameters are consistent with Grade II diastolic dysfunction  (pseudonormalization). Elevated left ventricular end-diastolic pressure. Right Ventricle: The right ventricular size is normal. No increase in right ventricular wall thickness. Right ventricular systolic function is normal. There is normal pulmonary artery systolic pressure. The tricuspid regurgitant velocity is 2.19 m/s, and  with an assumed right atrial pressure of 8 mmHg, the estimated right ventricular systolic pressure is 59.5 mmHg. Left Atrium: Left atrial size was severely dilated. Right Atrium: Right atrial size was normal in size. Pericardium: There is no evidence of pericardial effusion. Mitral Valve: The mitral valve is abnormal. There is mild thickening of the mitral valve leaflet(s). There is mild calcification of the mitral valve leaflet(s). Trivial mitral valve regurgitation. No evidence of mitral valve stenosis. Tricuspid Valve: The tricuspid valve is normal in structure. Tricuspid valve regurgitation is mild . No evidence of tricuspid stenosis. Aortic Valve: The aortic valve is tricuspid. There is mild calcification of the aortic valve. There is mild thickening of the aortic valve. Aortic valve regurgitation is mild to moderate. Aortic regurgitation PHT measures 320 msec. Aortic valve sclerosis  is present, with no evidence of aortic valve stenosis. Pulmonic Valve: The pulmonic valve was normal in structure. Pulmonic valve regurgitation is mild. No evidence of pulmonic stenosis. Aorta: Aortic dilatation noted. There is mild dilatation of the aortic root, measuring 38 mm. There is moderate dilatation of the ascending aorta, measuring 41 mm. Venous: The inferior vena cava is normal in size with greater than 50% respiratory variability, suggesting right atrial pressure of 3 mmHg. IAS/Shunts: No atrial level shunt detected by color flow Doppler.  LEFT VENTRICLE PLAX 2D LVIDd:         4.70 cm      Diastology LVIDs:         3.70 cm      LV e' medial:    6.09 cm/s LV PW:         0.90 cm      LV E/e'  medial:  20.0 LV IVS:        0.90 cm      LV e' lateral:   8.27 cm/s LVOT diam:  2.10 cm      LV E/e' lateral: 14.8 LV SV:         88 LV SV Index:   54 LVOT Area:     3.46 cm  LV Volumes (MOD) LV vol d, MOD A2C: 96.3 ml LV vol d, MOD A4C: 107.0 ml LV vol s, MOD A2C: 52.4 ml LV vol s, MOD A4C: 48.6 ml LV SV MOD A2C:     43.9 ml LV SV MOD A4C:     107.0 ml LV SV MOD BP:      51.5 ml RIGHT VENTRICLE RV S prime:     18.20 cm/s TAPSE (M-mode): 2.7 cm LEFT ATRIUM              Index        RIGHT ATRIUM           Index LA diam:        5.30 cm  3.23 cm/m   RA Area:     17.20 cm LA Vol (A2C):   111.0 ml 67.57 ml/m  RA Volume:   40.40 ml  24.59 ml/m LA Vol (A4C):   96.7 ml  58.86 ml/m LA Biplane Vol: 110.0 ml 66.96 ml/m  AORTIC VALVE             PULMONIC VALVE LVOT Vmax:   161.00 cm/s PR End Diast Vel: 3.93 msec LVOT Vmean:  94.800 cm/s LVOT VTI:    0.255 m AI PHT:      320 msec  AORTA Ao Root diam: 3.80 cm Ao Asc diam:  4.10 cm MITRAL VALVE                TRICUSPID VALVE MV Area (PHT): 4.21 cm     TR Peak grad:   19.2 mmHg MV Decel Time: 180 msec     TR Vmax:        219.00 cm/s MV E velocity: 122.00 cm/s MV A velocity: 139.00 cm/s  SHUNTS MV E/A ratio:  0.88         Systemic VTI:  0.26 m                             Systemic Diam: 2.10 cm Jenkins Rouge MD Electronically signed by Jenkins Rouge MD Signature Date/Time: 04/09/2022/4:04:15 PM    Final       LOS: 2 days    Flora Lipps, MD [facility] Available via Epic secure chat 7am-7pm After these hours, please refer to coverage provider listed on [email or web address] 04/09/2022, 4:06 PM

## 2022-04-09 NOTE — Evaluation (Signed)
Physical Therapy Evaluation Patient Details Name: [name] MRN: [id number] DOB: Sep 03, 1929 Today's Date: 04/09/2022  History of Present Illness  [name], a  89 year old female with a history of recurrent pericarditis, hypertension, thoracic aneurysm, anemia, CKD, aortic insufficiency with preserved EF in 2022 and chronic hyponatremia, left hip closed reduction. For several days she has been much more fatigued than usual and reports that she sleeps all the time.  Clinical Impression  Pt presents with decreased activity tolerance due to the above diagnosis. Pt lives alone in an [facility] living apt in [place]. Pt's PLF is mod Indep with RW. Pt is currently mod. Indep-min guard assist with mobility. Pt does have decreased endurance and LE weakness. I educated her on LE there ex and continued use of RW. Recommend D/C back to ALF with meals brought to her room for a few days and a HHPT referral to focus on strengthening and improving mobility and independence. Pt will continue to benefit from acute skilled PT to maximize mobility and independence until d/c to next venue of care.      Recommendations for follow up therapy are one component of a multi-disciplinary discharge planning process, led by the attending physician.  Recommendations may be updated based on patient status, additional functional criteria and insurance authorization.  Follow Up Recommendations Home health PT      Assistance Recommended at Discharge PRN  Patient can return home with the following  Help with stairs or ramp for entrance;Assist for transportation;Assistance with cooking/housework    Equipment Recommendations None recommended by PT  Recommendations for Other Services       Functional Status Assessment Patient has had a recent decline in their functional status and demonstrates the ability to make significant improvements in function in a reasonable and predictable amount of time.     Precautions /  Restrictions Precautions Precautions: Fall Restrictions Weight Bearing Restrictions: No      Mobility  Bed Mobility Overal bed mobility: Modified Independent                  Transfers Overall transfer level: Modified independent Equipment used: Rolling walker (2 wheels)               General transfer comment: increased time and effort, use of B UE    Ambulation/Gait Ambulation/Gait assistance: Min guard Gait Distance (Feet): 120 Feet Assistive device: Rolling walker (2 wheels) Gait Pattern/deviations: Decreased stride length, Steppage, Decreased dorsiflexion - left Gait velocity: decreased     General Gait Details: Increased left hip flexion during swing phase secondary to foot drop dec DF, Hammer toes, increased arch and ankle weakness contributing to dec balance and need for [employer] Mobility    Modified Rankin (Stroke Patients Only)       Balance Overall balance assessment: Mild deficits observed, not formally tested                                           Pertinent Vitals/Pain Pain Assessment Pain Assessment: No/denies pain    Home Living Family/patient expects to be discharged to:: Assisted living                 Home Equipment: Conservation officer, nature (2 wheels)      Prior Function Prior Level of Function : Independent/Modified Independent  Mobility Comments: mod indep with RW in her apt, walked to dinning hall       Hand Dominance        Extremity/Trunk Assessment   Upper Extremity Assessment Upper Extremity Assessment: Defer to OT evaluation    Lower Extremity Assessment Lower Extremity Assessment: Generalized weakness       Communication   Communication: No difficulties  Cognition Arousal/Alertness: Awake/alert Behavior During Therapy: WFL for tasks assessed/performed Overall Cognitive Status: Within Functional Limits for tasks assessed                                           General Comments General comments (skin integrity, edema, etc.): B LE red skin, itchy, flakey, increased edema    Exercises Total Joint Exercises Ankle Circles/Pumps: AROM, Strengthening, Both, Seated Heel Slides: AROM, Strengthening, Both, 10 reps, Seated Hip ABduction/ADduction: AROM, Strengthening, Both, 10 reps, Seated Straight Leg Raises: AROM, Strengthening, Both, 10 reps, Seated Long Arc Quad: AROM, Strengthening, Both, 10 reps, Seated Other Exercises Other Exercises: Bil Ankle DF, PF, IV,EV sitting x 10 Other Exercises: Bil ankle AROM heel/toe raises sitting X10   Assessment/Plan    PT Assessment Patient needs continued PT services  PT Problem List Decreased strength;Decreased balance;Decreased range of motion;Decreased mobility;Decreased activity tolerance       PT Treatment Interventions DME instruction;Functional mobility training;Balance training;Patient/family education;Gait training;Therapeutic activities;Therapeutic exercise    PT Goals (Current goals can be found in the Care Plan section)  Acute Rehab PT Goals Patient Stated Goal: To return home PT Goal Formulation: With patient Time For Goal Achievement: 04/22/22 Potential to Achieve Goals: Good    Frequency Min 3X/week     Co-evaluation               AM-PAC PT "6 Clicks" Mobility  Outcome Measure Help needed turning from your back to your side while in a flat bed without using bedrails?: A Little Help needed moving from lying on your back to sitting on the side of a flat bed without using bedrails?: A Little Help needed moving to and from a bed to a chair (including a wheelchair)?: A Little Help needed standing up from a chair using your arms (e.g., wheelchair or bedside chair)?: A Little Help needed to walk in hospital room?: A Little Help needed climbing 3-5 steps with a railing? : A Little 6 Click Score: 18    End of Session Equipment Utilized  During Treatment: Gait belt Activity Tolerance: Patient limited by fatigue Patient left: in chair;with call bell/phone within reach Nurse Communication: Mobility status PT Visit Diagnosis: Muscle weakness (generalized) (M62.81);Difficulty in walking, not elsewhere classified (R26.2)    Time: 1829-9371 PT Time Calculation (min) (ACUTE ONLY): 41 min   Charges:   PT Evaluation $PT Eval Moderate Complexity: 1 Mod PT Treatments $Therapeutic Exercise: 8-22 mins         Lelon Mast 04/09/2022, 10:20 AM

## 2022-04-09 NOTE — Progress Notes (Signed)
Echocardiogram 2D Echocardiogram has been performed.  [name] 04/09/2022, 3:55 PM

## 2022-04-10 DIAGNOSIS — Z7189 Other specified counseling: Secondary | ICD-10-CM | POA: Diagnosis not present

## 2022-04-10 DIAGNOSIS — I872 Venous insufficiency (chronic) (peripheral): Secondary | ICD-10-CM | POA: Diagnosis not present

## 2022-04-10 DIAGNOSIS — D649 Anemia, unspecified: Secondary | ICD-10-CM | POA: Diagnosis not present

## 2022-04-10 DIAGNOSIS — I5031 Acute diastolic (congestive) heart failure: Secondary | ICD-10-CM | POA: Diagnosis not present

## 2022-04-10 LAB — CBC
HCT: 28.4 % — ABNORMAL LOW (ref 36.0–46.0)
Hemoglobin: 9.2 g/dL — ABNORMAL LOW (ref 12.0–15.0)
MCH: 27.3 pg (ref 26.0–34.0)
MCHC: 32.4 g/dL (ref 30.0–36.0)
MCV: 84.3 fL (ref 80.0–100.0)
Platelets: 408 10*3/uL — ABNORMAL HIGH (ref 150–400)
RBC: 3.37 MIL/uL — ABNORMAL LOW (ref 3.87–5.11)
RDW: 15.5 % (ref 11.5–15.5)
WBC: 11.3 10*3/uL — ABNORMAL HIGH (ref 4.0–10.5)
nRBC: 0 % (ref 0.0–0.2)

## 2022-04-10 LAB — BASIC METABOLIC PANEL
Anion gap: 13 (ref 5–15)
BUN: 30 mg/dL — ABNORMAL HIGH (ref 8–23)
CO2: 26 mmol/L (ref 22–32)
Calcium: 8.7 mg/dL — ABNORMAL LOW (ref 8.9–10.3)
Chloride: 94 mmol/L — ABNORMAL LOW (ref 98–111)
Creatinine, Ser: 1.01 mg/dL — ABNORMAL HIGH (ref 0.44–1.00)
GFR, Estimated: 53 mL/min — ABNORMAL LOW (ref >60)
Glucose, Bld: 104 mg/dL — ABNORMAL HIGH (ref 70–99)
Potassium: 4 mmol/L (ref 3.5–5.1)
Sodium: 133 mmol/L — ABNORMAL LOW (ref 135–145)

## 2022-04-10 LAB — MAGNESIUM: Magnesium: 2.1 mg/dL (ref 1.7–2.4)

## 2022-04-10 MED ORDER — DILTIAZEM HCL ER COATED BEADS 240 MG PO CP24
240.0000 mg | ORAL_CAPSULE | Freq: Every day | ORAL | Status: DC
  Administered 2022-04-10 – 2022-04-12 (×3): 240 mg via ORAL
  Filled 2022-04-10 (×3): qty 1

## 2022-04-10 MED ORDER — CARVEDILOL 6.25 MG PO TABS
6.2500 mg | ORAL_TABLET | Freq: Two times a day (BID) | ORAL | Status: DC
  Administered 2022-04-10 – 2022-04-12 (×4): 6.25 mg via ORAL
  Filled 2022-04-10 (×4): qty 1

## 2022-04-10 NOTE — Progress Notes (Addendum)
PROGRESS NOTE    [name]  YHC:[id number] DOB: 13-Aug-1929 DOA: 04/07/2022 PCP: Kathalene Frames, MD    Brief Narrative:  [name], a  89 year old female with a past medical history of recurrent pericarditis, hypertension, thoracic aneurysm, anemia, CKD, aortic insufficiency with preserved EF in 2022 and chronic hyponatremia presented to the hospital with increasing fatigue and weakness.  She was recently treated for UTI.  In the ED, patient was slightly hypertensive with bilateral lower extremity edema.  COVID influenza and RSV was negative.  Labs showed mild hyponatremia with sodium of 130 with elevated BNP at 03/19/2011.  WBC was slightly elevated at 11.5 with hemoglobin of 8.0.  Chest x-ray was abnormal showing multifocal pneumonia versus pulmonary edema.  Patient was given 40 mg of Lasix and was admitted hospital for further evaluation of possible heart failure/generalized weakness and fatigue.  Assessment and plan.  Acute diastolic heart failure (HCC)  CXR revealed opacities favoring pulmonary edema vs multi-focal PNA.  Patient without fever but with mild leukocytosis and elevated BNP being more in favor of pulmonary congestion/CHF.  2D echocardiogram reviewed from 10/15/2020 with normal EF.  Patient did have crackles and elevated JVD on presentation so likely diastolic heart failure at this time.  Patient received IV Lasix during hospitalization.  Discontinued amlodipine. Patient has been started on Coreg and losartan. Denies previous history of congestive heart failure.  Repeat 2D echocardiogram from 04/09/2022 showed LV ejection fraction of 60 to 65% with grade 2 diastolic dysfunction.  Procal<0.3, will consider intermittent Lasix.  Patient will receive 20 mg of IV Lasix today.  Chronic venous stasis dermatitis of both lower extremities Continue supportive care.  Could consider oral diuretic every other day on discharge.  Mild hyponatremia.  Present on admission.  Likely  secondary to hypervolemic hyponatremia.  Sodium level today at 133.   Advanced care planning/counseling discussion    DNR/DNI.    Consulted palliative care   History of chronic cystitis Patient recently treated for recurrent UTI . Urinalysis in ED with many bacteria but only 6-10 WBC/hpf.  Asymptomatic at this time. Patient was recently treated with ciprofloxacin.  WBC at 11.3.  Afebrile.   Anemia Long history of normocytic anemia.  Patient follows up with hematology as outpatient and last visit in November 2023 with hemoglobin of 9.2.  Hemoglobin today at 9.2.  [name] at baseline.  Patient continues on iron and vitamin B12.  MMA pending.  Iron low at 17 and received a dose of IV ferriclet '125mg'$ .  Has been seen by physical therapy who recommend home PT on discharge.  Aortic insufficiency Last Cardiology visit with Dr. Daneen Schick August '23 - she does not want any intervention or further testing. Dr. Tamala Julian wanted better BP control and started amlodipine.  2D echocardiogram has been ordered.  Off amlodipine.  2D echocardiogram from 04/09/2022 with mild to moderate aortic valve regurgitation with sclerosis and ascending aortic dilatation at 41 mm.  Essential hypertension Accelerated.  Has been started on Coreg and losartan.  Will increase dose of Coreg.  On Coreg 12.5 twice daily at home.  Will increase to 6.25 twice daily for now.  Will discontinue amlodipine as was on Cardizem as well..  Will give 1 dose of IV Lasix today.  Will continue to monitor.  Will restart Cardizem from home.  Deconditioning, debility.  Family concerned about her safety at independent living facility.  Patient states that she is unable to do a lot of things now and wishes to have  more help.  Physical therapy has seen the patient.  Communicated with TOC.  Patient might benefit from a higher level of care-short-term skilled nursing facility.    DVT prophylaxis: enoxaparin (LOVENOX) injection 40 mg Start: 04/07/22  2200   Code Status:     Code Status: DNR  Disposition:  Currently at [facility] facility.  Will likely need higher level of care-skilled nursing facility.  Likely disposition in 2 to 3 days.  Status is: Inpatient  Remains inpatient appropriate because: Congestive heart failure, anemia, deconditioning debility   Family Communication:  Spoke with the patient's son and daughter-in-law on 04/09/2022 wishing her to go to go to higher level of care if possible.  TOC on board  Consultants:  None  Procedures:  2D echocardiogram  Antimicrobials:  None  Anti-infectives (From admission, onward)    None       Subjective: Today, patient was seen and examined at bedside.  Feels better.  Was able to lie and sleep better.  Denies any nausea vomiting chest pain but he still has some weakness.    Objective: Vitals:   04/09/22 1313 04/09/22 2025 04/10/22 0531 04/10/22 0847  BP: (!) 157/79 (!) 179/81 (!) 166/81 (!) 170/85  Pulse: 85 85 80 88  Resp: '14 18 18 18  '$ Temp: 98.4 F (36.9 C) 98.6 F (37 C) 98.3 F (36.8 C) 98 F (36.7 C)  TempSrc: Oral Oral Oral Oral  SpO2: 95% 95% 96% 98%  Weight:      Height:        Intake/Output Summary (Last 24 hours) at 04/10/2022 1022 Last data filed at 04/10/2022 0929 Gross per 24 hour  Intake 3 ml  Output --  Net 3 ml   Filed Weights   04/08/22 1806  Weight: 63.5 kg    Physical Examination: Body mass index is 25.61 kg/m.   General: Elderly female, not in obvious distress, alert awake and Communicative.   HENT: Mild pallor noted, oral mucosa is moist.  Chest:   No crackles or wheezes.  Diminished breath sounds noted.  CVS: S1 &S2 heard. No murmur.  Regular rate and rhythm. Abdomen: Soft, nontender, nondistended.  Bowel sounds are heard.   Extremities: Bilateral lower extremity with erythema edema and signs of venous stasis.   Psych: Alert, awake and oriented, normal mood CNS:  No cranial nerve deficits.  Power equal in  all extremities.   Skin: Warm and dry.  Bilateral lower extremity with edema erythema and venous stasis.  Data Reviewed:   CBC: Recent Labs  Lab 04/07/22 1640 04/10/22 0736  WBC 11.5* 11.3*  HGB 8.0* 9.2*  HCT 25.7* 28.4*  MCV 86.2 84.3  PLT 340 408*    Basic Metabolic Panel: Recent Labs  Lab 04/07/22 1640 04/08/22 0616 04/09/22 0554 04/10/22 0736  NA 130* 132* 132* 133*  K 4.6 3.8 3.6 4.0  CL 102 96* 93* 94*  CO2 '22 26 28 26  '$ GLUCOSE 98 92 97 104*  BUN 25* 23 34* 30*  CREATININE 0.87 0.92 1.16* 1.01*  CALCIUM 8.2* 8.4* 8.2* 8.7*  MG  --   --   --  2.1    Liver Function Tests: Recent Labs  Lab 04/07/22 1640  AST 44*  ALT 30  ALKPHOS 89  BILITOT 0.4  PROT 7.1  ALBUMIN 3.1*     Radiology Studies: ECHOCARDIOGRAM COMPLETE  Result Date: 04/09/2022    ECHOCARDIOGRAM REPORT   Patient Name:   [name] Date of Exam: 04/09/2022  Medical Rec #:  [id number]         Height:       62.0 in Accession #:    [id number]        Weight:       140.0 lb Date of Birth:  [birth year]        BSA:          1.643 m Patient Age:    58 years          BP:           167/75 mmHg Patient Gender: F                 HR:           98 bpm. Exam Location:  Inpatient Procedure: 2D Echo, Cardiac Doppler and Color Doppler Indications:    CHF-Acute Diastolic U54.27  History:        Patient has prior history of Echocardiogram examinations, most                 recent 10/15/2020. AI; Risk Factors:Hypertension and GERD.  Sonographer:    Bernadene Person RDCS Referring Phys: [place]  1. Left ventricular ejection fraction, by estimation, is 60 to 65%. The left ventricle has normal function. The left ventricle has no regional wall motion abnormalities. Left ventricular diastolic parameters are consistent with Grade II diastolic dysfunction (pseudonormalization). Elevated left ventricular end-diastolic pressure.  2. Right ventricular systolic function is normal. The right ventricular size  is normal. There is normal pulmonary artery systolic pressure.  3. Left atrial size was severely dilated.  4. The mitral valve is abnormal. Trivial mitral valve regurgitation. No evidence of mitral stenosis.  5. The aortic valve is tricuspid. There is mild calcification of the aortic valve. There is mild thickening of the aortic valve. Aortic valve regurgitation is mild to moderate. Aortic valve sclerosis is present, with no evidence of aortic valve stenosis.  6. Aortic dilatation noted. There is mild dilatation of the aortic root, measuring 38 mm. There is moderate dilatation of the ascending aorta, measuring 41 mm.  7. The inferior vena cava is normal in size with greater than 50% respiratory variability, suggesting right atrial pressure of 3 mmHg. FINDINGS  Left Ventricle: Left ventricular ejection fraction, by estimation, is 60 to 65%. The left ventricle has normal function. The left ventricle has no regional wall motion abnormalities. The left ventricular internal cavity size was normal in size. There is  no left ventricular hypertrophy. Left ventricular diastolic parameters are consistent with Grade II diastolic dysfunction (pseudonormalization). Elevated left ventricular end-diastolic pressure. Right Ventricle: The right ventricular size is normal. No increase in right ventricular wall thickness. Right ventricular systolic function is normal. There is normal pulmonary artery systolic pressure. The tricuspid regurgitant velocity is 2.19 m/s, and  with an assumed right atrial pressure of 8 mmHg, the estimated right ventricular systolic pressure is 06.2 mmHg. Left Atrium: Left atrial size was severely dilated. Right Atrium: Right atrial size was normal in size. Pericardium: There is no evidence of pericardial effusion. Mitral Valve: The mitral valve is abnormal. There is mild thickening of the mitral valve leaflet(s). There is mild calcification of the mitral valve leaflet(s). Trivial mitral valve regurgitation.  No evidence of mitral valve stenosis. Tricuspid Valve: The tricuspid valve is normal in structure. Tricuspid valve regurgitation is mild . No evidence of tricuspid stenosis. Aortic Valve: The aortic valve is tricuspid. There is mild calcification of the aortic valve. There  is mild thickening of the aortic valve. Aortic valve regurgitation is mild to moderate. Aortic regurgitation PHT measures 320 msec. Aortic valve sclerosis  is present, with no evidence of aortic valve stenosis. Pulmonic Valve: The pulmonic valve was normal in structure. Pulmonic valve regurgitation is mild. No evidence of pulmonic stenosis. Aorta: Aortic dilatation noted. There is mild dilatation of the aortic root, measuring 38 mm. There is moderate dilatation of the ascending aorta, measuring 41 mm. Venous: The inferior vena cava is normal in size with greater than 50% respiratory variability, suggesting right atrial pressure of 3 mmHg. IAS/Shunts: No atrial level shunt detected by color flow Doppler.  LEFT VENTRICLE PLAX 2D LVIDd:         4.70 cm      Diastology LVIDs:         3.70 cm      LV e' medial:    6.09 cm/s LV PW:         0.90 cm      LV E/e' medial:  20.0 LV IVS:        0.90 cm      LV e' lateral:   8.27 cm/s LVOT diam:     2.10 cm      LV E/e' lateral: 14.8 LV SV:         88 LV SV Index:   54 LVOT Area:     3.46 cm  LV Volumes (MOD) LV vol d, MOD A2C: 96.3 ml LV vol d, MOD A4C: 107.0 ml LV vol s, MOD A2C: 52.4 ml LV vol s, MOD A4C: 48.6 ml LV SV MOD A2C:     43.9 ml LV SV MOD A4C:     107.0 ml LV SV MOD BP:      51.5 ml RIGHT VENTRICLE RV S prime:     18.20 cm/s TAPSE (M-mode): 2.7 cm LEFT ATRIUM              Index        RIGHT ATRIUM           Index LA diam:        5.30 cm  3.23 cm/m   RA Area:     17.20 cm LA Vol (A2C):   111.0 ml 67.57 ml/m  RA Volume:   40.40 ml  24.59 ml/m LA Vol (A4C):   96.7 ml  58.86 ml/m LA Biplane Vol: 110.0 ml 66.96 ml/m  AORTIC VALVE             PULMONIC VALVE LVOT Vmax:   161.00 cm/s PR End Diast  Vel: 3.93 msec LVOT Vmean:  94.800 cm/s LVOT VTI:    0.255 m AI PHT:      320 msec  AORTA Ao Root diam: 3.80 cm Ao Asc diam:  4.10 cm MITRAL VALVE                TRICUSPID VALVE MV Area (PHT): 4.21 cm     TR Peak grad:   19.2 mmHg MV Decel Time: 180 msec     TR Vmax:        219.00 cm/s MV E velocity: 122.00 cm/s MV A velocity: 139.00 cm/s  SHUNTS MV E/A ratio:  0.88         Systemic VTI:  0.26 m                             Systemic Diam: 2.10 cm Jenkins Rouge  MD Electronically signed by Jenkins Rouge MD Signature Date/Time: 04/09/2022/4:04:15 PM    Final       LOS: 3 days    Flora Lipps, MD [facility] Available via Epic secure chat 7am-7pm After these hours, please refer to coverage provider listed on [email or web address] 04/10/2022, 10:22 AM

## 2022-04-10 NOTE — TOC Initial Note (Signed)
Transition of Care [facility]) - Initial/Assessment Note    Patient Details  Name: [name] MRN: [id number] Date of Birth: 1929/09/18  [facility]) CM/SW Contact:    Illene Regulus, LCSW Phone Number: 04/10/2022, 10:35 AM  Clinical Narrative:                 CSW spoke with pt's son regarding a consult for SNF placement. Pt's son stated his concerns about his mother discharging back home with no assistance. CSW informed pt's son about PT recommendations for [facility] services. Pt's son stated he would like his mother to return home with [facility] services in place. CSW attempted to contact Rollene Fare with legacy to arrange [facility] services no answer left a HIPAA complaint VM requesting a return call. TOC to follow   Expected Discharge Plan: [place] Barriers to Discharge: Continued Medical Work up   Patient Goals and CMS Choice Patient states their goals for this hospitalization and ongoing recovery are:: return home with Home Health services CMS Medicare.gov Compare Post Acute Care list provided to:: Patient Represenative (must comment) Choice offered to / list presented to : Adult Children      Expected Discharge Plan and Services       Living arrangements for the past 2 months: [place]                                      Prior Living Arrangements/Services Living arrangements for the past 2 months: [place] Lives with:: Self Patient language and need for interpreter reviewed:: Yes Do you feel safe going back to the place where you live?: Yes      Need for Family Participation in Patient Care: Yes (Comment) Care giver support system in place?: Yes (comment)   Criminal Activity/Legal Involvement Pertinent to Current Situation/Hospitalization: No - Comment as needed  Activities of Daily Living Home Assistive Devices/Equipment: Walker (specify type) ADL Screening (condition at time of admission) Patient's  cognitive ability adequate to safely complete daily activities?: Yes Is the patient deaf or have difficulty hearing?: No Does the patient have difficulty seeing, even when wearing glasses/contacts?: No Does the patient have difficulty concentrating, remembering, or making decisions?: No Patient able to express need for assistance with ADLs?: Yes Does the patient have difficulty dressing or bathing?: No Independently performs ADLs?: Yes (appropriate for developmental age) Does the patient have difficulty walking or climbing stairs?: Yes Weakness of Legs: Both Weakness of Arms/Hands: None  Permission Sought/Granted   Permission granted to share information with : Yes, Verbal Permission Granted           Permission granted to share info w Contact Information: [name] (Son) [phone] [facility] Phone)  Emotional Assessment Appearance:: Appears stated age     Orientation: : Oriented to Self, Oriented to Place, Oriented to  Time, Oriented to Situation Alcohol / Substance Use: Not Applicable Psych Involvement: No (comment)  Admission diagnosis:  Acute diastolic heart failure (HCC) [I50.31] Acute congestive heart failure, unspecified heart failure type [facility]) [I50.9] Patient Active Problem List   Diagnosis Date Noted   Acute diastolic heart failure ([place]) 04/07/2022   Advanced care planning/counseling discussion 04/07/2022   Chronic venous stasis dermatitis of both lower extremities 04/07/2022   Malaise and fatigue 07/15/2021   Primary osteoarthritis 04/20/2021   Inflammatory polyarthropathy ([place]) 04/20/2021   Hyperuricemia 04/20/2021   Fatigue 04/20/2021   Chronic  kidney disease, stage 3 unspecified ([place]) 04/20/2021   Adjustment disorder with mixed disturbance of emotions and conduct 01/20/2021   Severe recurrent major depression without psychotic features ([place]) 01/19/2021   Pincer nail deformity 12/01/2020   Iron deficiency anemia 03/24/2020   [place] 02/19/2020   Lumbar  radiculopathy 06/17/2019   Pain due to onychomycosis of toenails of both feet 05/14/2019   Mixed incontinence 03/10/2019   Cervical spondylosis 06/07/2018   DDD (degenerative disc disease), cervical 05/20/2018   History of total knee replacement, left 04/13/2018   History of total knee replacement, right 04/13/2018   History of revision of total replacement of left hip joint 04/13/2018   Gait abnormality 09/08/2017   Chronic cystitis 07/05/2017   Dysuria 07/05/2017   Anemia 09/22/2015   h/o pericarditis    Essential hypertension    Aortic insufficiency    Posterior vitreous detachment, bilateral 06/05/2015   Left wrist pain 05/16/2015   Triquetral chip fracture, left, closed, initial encounter 05/16/2015   Primary osteoarthritis of first carpometacarpal joint of right hand 02/11/2015   Primary osteoarthritis of right wrist 02/11/2015   Arthropathy 11/25/2014   Subluxation 11/25/2014   Hip dislocation, left ([place]) 12/05/2013   Trochanteric bursitis of left hip 11/24/2012   Aneurysm of thoracic aorta (HCC)    Nonexudative age-related macular degeneration 04/22/2011   Posterior capsular opacification, right 04/22/2011   Status post intraocular lens implant 04/22/2011   PCP:  Kathalene Frames, MD Pharmacy:   [place], [place] - [address] [place] [address] Phone: [phone] Fax: [phone]     Social Determinants of Health (SDOH) Social History: SDOH Screenings   Food Insecurity: No Food Insecurity (04/08/2022)  Housing: Low Risk  (04/08/2022)  Transportation Needs: No Transportation Needs (04/08/2022)  Utilities: Not At Risk (04/08/2022)  Alcohol Screen: Low Risk  (01/19/2021)  Depression (PHQ2-9): Low Risk  (06/10/2021)  Tobacco Use: Medium Risk (04/08/2022)   SDOH Interventions: Housing Interventions: Patient Refused   Readmission Risk Interventions     No data to display

## 2022-04-11 DIAGNOSIS — D649 Anemia, unspecified: Secondary | ICD-10-CM | POA: Diagnosis not present

## 2022-04-11 DIAGNOSIS — I872 Venous insufficiency (chronic) (peripheral): Secondary | ICD-10-CM | POA: Diagnosis not present

## 2022-04-11 DIAGNOSIS — I5031 Acute diastolic (congestive) heart failure: Secondary | ICD-10-CM | POA: Diagnosis not present

## 2022-04-11 DIAGNOSIS — Z7189 Other specified counseling: Secondary | ICD-10-CM | POA: Diagnosis not present

## 2022-04-11 LAB — BASIC METABOLIC PANEL
Anion gap: 11 (ref 5–15)
BUN: 33 mg/dL — ABNORMAL HIGH (ref 8–23)
CO2: 26 mmol/L (ref 22–32)
Calcium: 8.4 mg/dL — ABNORMAL LOW (ref 8.9–10.3)
Chloride: 93 mmol/L — ABNORMAL LOW (ref 98–111)
Creatinine, Ser: 0.97 mg/dL (ref 0.44–1.00)
GFR, Estimated: 56 mL/min — ABNORMAL LOW (ref >60)
Glucose, Bld: 98 mg/dL (ref 70–99)
Potassium: 4 mmol/L (ref 3.5–5.1)
Sodium: 130 mmol/L — ABNORMAL LOW (ref 135–145)

## 2022-04-11 LAB — CBC
HCT: 25.2 % — ABNORMAL LOW (ref 36.0–46.0)
Hemoglobin: 8.2 g/dL — ABNORMAL LOW (ref 12.0–15.0)
MCH: 27.6 pg (ref 26.0–34.0)
MCHC: 32.5 g/dL (ref 30.0–36.0)
MCV: 84.8 fL (ref 80.0–100.0)
Platelets: 413 10*3/uL — ABNORMAL HIGH (ref 150–400)
RBC: 2.97 MIL/uL — ABNORMAL LOW (ref 3.87–5.11)
RDW: 15.5 % (ref 11.5–15.5)
WBC: 10.8 10*3/uL — ABNORMAL HIGH (ref 4.0–10.5)
nRBC: 0 % (ref 0.0–0.2)

## 2022-04-11 LAB — MAGNESIUM: Magnesium: 2.2 mg/dL (ref 1.7–2.4)

## 2022-04-11 MED ORDER — FUROSEMIDE 20 MG PO TABS: 20.0000 mg | ORAL_TABLET | ORAL | 3 refills | Status: DC

## 2022-04-11 MED ORDER — POTASSIUM CHLORIDE CRYS ER 10 MEQ PO TBCR: 10.0000 meq | EXTENDED_RELEASE_TABLET | Freq: Every day | ORAL | 0 refills | Status: DC

## 2022-04-11 MED ORDER — LOSARTAN POTASSIUM 25 MG PO TABS: 25.0000 mg | ORAL_TABLET | Freq: Every day | ORAL | 2 refills | Status: DC

## 2022-04-11 MED ORDER — CARVEDILOL 3.125 MG PO TABS: 3.1250 mg | ORAL_TABLET | Freq: Two times a day (BID) | ORAL | 2 refills | Status: DC

## 2022-04-11 NOTE — TOC Progression Note (Addendum)
Transition of Care [facility]) - Progression Note    Patient Details  Name: [name] MRN: [id number] Date of Birth: [birth year]  Transition of Care [facility]) CM/SW [place], RN Phone Number: 04/11/2022, 12:41 PM  Clinical Narrative:   This RNCM spoke with patient's son [name] regarding current discharge plan. Patient's son reports the plan was to discharge tomorrow 04/12/22. Patient's son chose [facility],  the home health agency onsite for [name]. Son reports he lives in [place] which is 2.5 hours away and he plans to transport patient home at discharge.  This RNCM spoke with [facility] representative who will have RN call me back, awaiting call back. Marland Kitchen   TOC will continue to follow. - 2:25pm this RNCM spoke with representative with [facility] who provided fax # [phone] for clinicals. This RNCM will fax [facility] orders as well requested the nurse to give a call back.   -2:52p This RNCM received a call back from [place] the nurse with [facility] who reports they do not have the staffing to accept the patient today, however will be available tomorrow. Once patient is discharged the following will need to be faxed to: [phone], facesheet, d/c summary, [place] orders.Notified MD, RN  TOC will continue to follow   Expected Discharge Plan: [place] Barriers to Discharge: Continued Medical Work up  Expected Discharge Plan and [place] arrangements for the past 2 months: [place] Expected Discharge Date: 04/11/22                                     Social Determinants of Health (SDOH) Interventions SDOH Screenings   Food Insecurity: No Food Insecurity (04/08/2022)  Housing: Low Risk  (04/08/2022)  Transportation Needs: No Transportation Needs (04/08/2022)  Utilities: Not At Risk (04/08/2022)  Alcohol Screen: Low Risk  (01/19/2021)  Depression (PHQ2-9): Low Risk   (06/10/2021)  Tobacco Use: Medium Risk (04/08/2022)    Readmission Risk Interventions     No data to display

## 2022-04-11 NOTE — Discharge Summary (Incomplete)
Physician Discharge Summary  [name] RKY:[id number] DOB: 1930/03/13 DOA: 04/07/2022  PCP: Kathalene Frames, MD  Admit date: 04/07/2022 Discharge date: 04/12/2022  Admitted From: Home  Discharge disposition: Independent living facility with home health  Recommendations for Outpatient Follow-Up:   Follow up with your primary care provider in one week.  Check CBC, BMP, magnesium in the next visit  Discharge Diagnosis:   Principal Problem:   Acute diastolic heart failure (HCC) Active Problems:   Chronic venous stasis dermatitis of both lower extremities   Essential hypertension   Aortic insufficiency   Anemia   Chronic cystitis   Advanced care planning/counseling discussion  Discharge Condition: Improved.  Diet recommendation: Low sodium, heart healthy.  Fluid restriction 1500 MLS per day.  Wound care: None.  Code status: DNR   History of Present Illness:   [name], a  89 year old female with a past medical history of recurrent pericarditis, hypertension, thoracic aneurysm, anemia, CKD, aortic insufficiency with preserved EF in 2022 and chronic hyponatremia presented to the hospital with increasing fatigue and weakness.  She was recently treated for UTI.  In the ED, patient was slightly hypertensive with bilateral lower extremity edema.  COVID influenza and RSV was negative.  Labs showed mild hyponatremia with sodium of 130 with elevated BNP at 03/19/2011.  WBC was slightly elevated at 11.5 with hemoglobin of 8.0.  Chest x-ray was abnormal showing multifocal pneumonia versus pulmonary edema.  Patient was given 40 mg of Lasix and was admitted hospital for further evaluation of possible heart failure/generalized weakness and fatigue.   Hospital Course:   Following conditions were addressed during hospitalization as listed below,  Acute diastolic heart failure ([place])  CXR revealed opacities favoring pulmonary edema vs multi-focal PNA.  Patient without fever but  with mild leukocytosis and elevated BNP being more in favor of pulmonary congestion/CHF.  2D echocardiogram reviewed from 10/15/2020 with normal EF.  Patient did have crackles and elevated JVD on presentation so likely diastolic heart failure at this time.  Patient received IV Lasix during hospitalization.  Discontinued amlodipine. Patient has been started on Coreg and losartan. Denies previous history of congestive heart failure.  Repeat 2D echocardiogram from 04/09/2022 showed LV ejection fraction of 60 to 65% with grade 2 diastolic dysfunction.  Procal<0.3.  Patient will be prescribed low-dose oral Lasix alternate day on discharge.  Fluid restriction 1500 MLS per day.  Daily weights.   Chronic venous stasis dermatitis of both lower extremities Continue supportive care.  Could consider oral diuretic every other day on discharge.   Mild hyponatremia.  Present on admission.  Likely secondary to hypervolemic hyponatremia.  Sodium level today at 131.  Monitor BMP periodically while on diuretics.    Advanced care planning/counseling discussion    DNR/DNI.    Palliative care was consulted and MOST form was filled in.   History of chronic cystitis Patient recently treated for recurrent UTI . Urinalysis in ED with many bacteria but only 6-10 WBC/hpf.  Patient was recently treated with ciprofloxacin.  WBC at 11.7.  Afebrile.  Asymptomatic at this time.  No need for further treatment.   Anemia Long history of normocytic anemia.  Patient follows up with hematology as outpatient and last visit in November 2023 with hemoglobin of 9.2.  Hemoglobin today at 8.7 likely at baseline.   Patient continues on iron and vitamin B12.  MMA pending.  Iron low at 17 and received a dose of IV ferriclet '125mg'$ .  Has been seen by physical  therapy who recommend home PT on discharge.   Aortic insufficiency Last Cardiology visit with Dr. Daneen Schick August '23 - she does not want any intervention or further testing. Dr. Tamala Julian wanted  better BP control and started amlodipine.  2D echocardiogram has been ordered.  Off amlodipine.  2D echocardiogram from 04/09/2022 with mild to moderate aortic valve regurgitation with sclerosis and ascending aortic dilatation at 41 mm.   Essential hypertension Continue Coreg, losartan and Cardizem.  Discontinue amlodipine from home.  Losartan was added during hospitalization.   Deconditioning, debility.  Patient will be at independent living facility with home health.  Disposition.  At this time, patient is stable for disposition to independent living facility with home health.  Medical Consultants:   None.  Procedures:    None                             Subjective:   Today, patient seen and examined at bedside.  Feels better.  Denies any dyspnea dizziness lightheadedness.  Feels stronger.  Was able to go to the bathroom and come back.                                                Discharge Exam:   Vitals:   04/12/22 0550 04/12/22 0820  BP: (!) 161/69 (!) 162/99  Pulse: 67 86  Resp:    Temp:    SpO2:     Vitals:   04/12/22 0525 04/12/22 0529 04/12/22 0550 04/12/22 0820  BP: (!) 175/72  (!) 161/69 (!) 162/99  Pulse: 69  67 86  Resp: 16     Temp: 97.6 F (36.4 C)     TempSrc: Oral     SpO2: 98%     Weight:  63.8 kg    Height:       General: Alert awake, not in obvious distress, elderly female, alert awake and oriented HENT: pupils equally reacting to light, mild pallor noted.  Oral mucosa is moist.  Chest:  Clear breath sounds.  Diminished breath sounds bilaterally. No crackles or wheezes.  CVS: S1 &S2 heard. No murmur.  Regular rate and rhythm. Abdomen: Soft, nontender, nondistended.  Bowel sounds are heard.   Extremities: No cyanosis, clubbing with bilateral lower extremity venous insufficiency changes.  Psych: Alert, awake and oriented, normal mood CNS:  No cranial nerve deficits.  Moves all extremities. Skin: Warm and dry.  Lateral lower extremity erythematous  changes from venous insufficiency.  The results of significant diagnostics from this hospitalization (including imaging, microbiology, ancillary and laboratory) are listed below for reference.     Diagnostic Studies:   CT Head Wo Contrast  Result Date: 04/07/2022 CLINICAL DATA:  [place] AMS EXAM: CT HEAD WITHOUT CONTRAST TECHNIQUE: Contiguous axial images were obtained from the base of the skull through the vertex without intravenous contrast. RADIATION DOSE REDUCTION: This exam was performed according to the departmental dose-optimization program which includes automated exposure control, adjustment of the mA and/or kV according to patient size and/or use of iterative reconstruction technique. COMPARISON:  03/05/2022. FINDINGS: Brain: There is periventricular white matter decreased attenuation consistent with small vessel ischemic changes. Ventricles, sulci and cisterns are prominent consistent with age related involutional changes. No acute intracranial hemorrhage, mass effect or shift. Ventricular prominence seems out of proportion to the extent of  involutional changes which can be seen with normal pressure hydrocephalus, and this is stable finding. Vascular: No hyperdense vessel or unexpected calcification. Skull: Normal. Negative for fracture or focal lesion. Sinuses/Orbits: No acute finding. IMPRESSION: 1. Atrophy and chronic small vessel ischemic changes. 2. Possible normal pressure hydrocephalus. 3. Otherwise no acute intracranial process identified. Electronically Signed   By: Sammie Bench M.D.   On: 04/07/2022 14:32   DG Chest 2 View  Result Date: 04/07/2022 CLINICAL DATA:  Altered mental status.  Urinary tract infection. EXAM: CHEST - 2 VIEW COMPARISON:  AP chest 01/16/2021, chest two views 11/12/2016 FINDINGS: Cardiac silhouette is at the upper limits of normal size. Mediastinal contours are within normal limits. New moderate to high-grade bilateral interstitial thickening with diffuse  bilateral patchy heterogeneous airspace opacities. No definite pleural effusion. No pneumothorax. Moderate to severe multilevel degenerative disc changes of the thoracic spine. IMPRESSION: New moderate to high-grade bilateral interstitial thickening with bilateral patchy heterogeneous airspace opacities diffusely. Findings may represent pulmonary edema or multifocal pneumonia. Electronically Signed   By: Yvonne Kendall M.D.   On: 04/07/2022 14:19     Labs:   Basic Metabolic Panel: Recent Labs  Lab 04/08/22 0616 04/09/22 0554 04/10/22 0736 04/11/22 0637 04/12/22 0533  NA 132* 132* 133* 130* 131*  K 3.8 3.6 4.0 4.0 4.1  CL 96* 93* 94* 93* 95*  CO2 '26 28 26 26 24  '$ GLUCOSE 92 97 104* 98 98  BUN 23 34* 30* 33* 36*  CREATININE 0.92 1.16* 1.01* 0.97 1.08*  CALCIUM 8.4* 8.2* 8.7* 8.4* 8.5*  MG  --   --  2.1 2.2 2.3   GFR Estimated Creatinine Clearance: 30.4 mL/min (A) (by C-G formula based on SCr of 1.08 mg/dL (H)). Liver Function Tests: Recent Labs  Lab 04/07/22 1640  AST 44*  ALT 30  ALKPHOS 89  BILITOT 0.4  PROT 7.1  ALBUMIN 3.1*   No results for input(s): "LIPASE", "AMYLASE" in the last 168 hours. No results for input(s): "AMMONIA" in the last 168 hours. Coagulation profile No results for input(s): "INR", "PROTIME" in the last 168 hours.  CBC: Recent Labs  Lab 04/07/22 1640 04/10/22 0736 04/11/22 0637 04/12/22 0533  WBC 11.5* 11.3* 10.8* 11.7*  HGB 8.0* 9.2* 8.2* 8.7*  HCT 25.7* 28.4* 25.2* 27.3*  MCV 86.2 84.3 84.8 84.5  PLT 340 408* 413* 457*   Cardiac Enzymes: No results for input(s): "CKTOTAL", "CKMB", "CKMBINDEX", "TROPONINI" in the last 168 hours. BNP: Invalid input(s): "POCBNP" CBG: Recent Labs  Lab 04/07/22 1339  GLUCAP 144*   D-Dimer No results for input(s): "DDIMER" in the last 72 hours. Hgb A1c No results for input(s): "HGBA1C" in the last 72 hours. Lipid Profile No results for input(s): "CHOL", "HDL", "LDLCALC", "TRIG", "CHOLHDL",  "LDLDIRECT" in the last 72 hours. Thyroid function studies No results for input(s): "TSH", "T4TOTAL", "T3FREE", "THYROIDAB" in the last 72 hours.  Invalid input(s): "FREET3" Anemia work up No results for input(s): "VITAMINB12", "FOLATE", "FERRITIN", "TIBC", "IRON", "RETICCTPCT" in the last 72 hours.  Microbiology Recent Results (from the past 240 hour(s))  Resp panel by RT-PCR (RSV, Flu A&B, Covid) Anterior Nasal Swab     Status: None   Collection Time: 04/07/22  9:06 PM   Specimen: Anterior Nasal Swab  Result Value Ref Range Status   SARS Coronavirus 2 by RT PCR NEGATIVE NEGATIVE Final    Comment: (NOTE) SARS-CoV-2 target nucleic acids are NOT DETECTED.  The SARS-CoV-2 RNA is generally detectable in upper respiratory specimens during  the acute phase of infection. The lowest concentration of SARS-CoV-2 viral copies this assay can detect is 138 copies/mL. A negative result does not preclude SARS-Cov-2 infection and should not be used as the sole basis for treatment or other patient management decisions. A negative result may occur with  improper specimen collection/handling, submission of specimen other than nasopharyngeal swab, presence of viral mutation(s) within the areas targeted by this assay, and inadequate number of viral copies(<138 copies/mL). A negative result must be combined with clinical observations, patient history, and epidemiological information. The expected result is Negative.  Fact Sheet for Patients:  EntrepreneurPulse.com.au  Fact Sheet for Healthcare Providers:  IncredibleEmployment.be  This test is no t yet approved or cleared by the [place] FDA and  has been authorized for detection and/or diagnosis of SARS-CoV-2 by FDA under an Emergency Use Authorization (EUA). This EUA will remain  in effect (meaning this test can be used) for the duration of the COVID-19 declaration under Section 564(b)(1) of the Act,  21 U.S.C.section 360bbb-3(b)(1), unless the authorization is terminated  or revoked sooner.       Influenza A by PCR NEGATIVE NEGATIVE Final   Influenza B by PCR NEGATIVE NEGATIVE Final    Comment: (NOTE) The Xpert Xpress SARS-CoV-2/FLU/RSV plus assay is intended as an aid in the diagnosis of influenza from Nasopharyngeal swab specimens and should not be used as a sole basis for treatment. Nasal washings and aspirates are unacceptable for Xpert Xpress SARS-CoV-2/FLU/RSV testing.  Fact Sheet for Patients: EntrepreneurPulse.com.au  Fact Sheet for Healthcare Providers: IncredibleEmployment.be  This test is not yet approved or cleared by the [place] FDA and has been authorized for detection and/or diagnosis of SARS-CoV-2 by FDA under an Emergency Use Authorization (EUA). This EUA will remain in effect (meaning this test can be used) for the duration of the COVID-19 declaration under Section 564(b)(1) of the Act, 21 U.S.C. section 360bbb-3(b)(1), unless the authorization is terminated or revoked.     Resp Syncytial Virus by PCR NEGATIVE NEGATIVE Final    Comment: (NOTE) Fact Sheet for Patients: EntrepreneurPulse.com.au  Fact Sheet for Healthcare Providers: IncredibleEmployment.be  This test is not yet approved or cleared by the [place] FDA and has been authorized for detection and/or diagnosis of SARS-CoV-2 by FDA under an Emergency Use Authorization (EUA). This EUA will remain in effect (meaning this test can be used) for the duration of the COVID-19 declaration under Section 564(b)(1) of the Act, 21 U.S.C. section 360bbb-3(b)(1), unless the authorization is terminated or revoked.  Performed at [facility], [place] [address] [place], [place] [address]      Discharge Instructions:   Discharge Instructions     (HEART FAILURE PATIENTS) Call MD:  Anytime you have any of  the following symptoms: 1) 3 pound weight gain in 24 hours or 5 pounds in 1 week 2) shortness of breath, with or without a dry hacking cough 3) swelling in the hands, feet or stomach 4) if you have to sleep on extra pillows at night in order to breathe.   Complete by: As directed    Avoid straining   Complete by: As directed    Diet - low sodium heart healthy   Complete by: As directed    Fluid restriction 1500 MLS per day   Discharge instructions   Complete by: As directed    Follow-up with your primary care provider in 1 week.  Check blood work in the next visit.  Take medications as prescribed.  No overexertion.  Continue physical therapy at home.   Heart Failure patients record your daily weight using the same scale at the same time of day   Complete by: As directed    Increase activity slowly   Complete by: As directed    STOP any activity that causes chest pain, shortness of breath, dizziness, sweating, or exessive weakness   Complete by: As directed       Allergies as of 04/12/2022       Reactions   Cephalexin Other (See Comments)   Pt states this caused eye problems and STROKE-LIKE SYMPTOMS!!   Shellfish-derived Products Hives, Rash   Clindamycin/lincomycin Rash   Lincomycin Rash   Clindamycin Hcl Other (See Comments)   "Per pharmacist"   Fosfomycin Other (See Comments)   Disorientation   Levofloxacin Other (See Comments)   Reaction not recalled by the patient   Penicillamine Other (See Comments)   Reaction not recalled by the patient   Sugar-protein-starch Other (See Comments)   Sugary foods cause legs to cramp   Tape Other (See Comments)   Please use paper tape or Coban Wrap; patient's skin tears VERY easily!!   Ciprofloxacin Rash   Sulfa Antibiotics Hives, Rash   Sulfites Hives, Swelling, Rash        Medication List     STOP taking these medications    nitrofurantoin (macrocrystal-monohydrate) 100 MG capsule Commonly known as: MACROBID       TAKE  these medications    allopurinol 100 MG tablet Commonly known as: ZYLOPRIM Take 50 mg by mouth in the morning.   amoxicillin 500 MG capsule Commonly known as: AMOXIL Take 2,000 mg by mouth See admin instructions. Take 2,000 mg by mouth one hour prior to dental procedures   aspirin 81 MG tablet Take 81 mg by mouth at bedtime.   betamethasone dipropionate 0.05 % cream Apply topically 2 (two) times daily. Applied to the right ear canal twice daily for 4 to 5 days or until itching resolves.   carvedilol 12.5 MG tablet Commonly known as: COREG Take 1 tablet (12.5 mg total) by mouth 2 (two) times daily with a meal.   diltiazem 240 MG 24 hr capsule Commonly known as: CARDIZEM CD Take 1 capsule (240 mg total) by mouth daily.   FeroSul 325 (65 FE) MG tablet Generic drug: ferrous sulfate Take 650 mg by mouth daily with breakfast.   furosemide 20 MG tablet Commonly known as: Lasix Take 1 tablet (20 mg total) by mouth every other day.   losartan 25 MG tablet Commonly known as: COZAAR Take 1 tablet (25 mg total) by mouth daily.   Mitigare 0.6 MG Caps Generic drug: Colchicine Take 0.6 mg by mouth in the morning.   NONFORMULARY OR COMPOUNDED ITEM Apply 1 application  topically See admin instructions. Triamcinolone 0.1% cream/Cetaphil compounded cream- Apply to the back, legs, and arms 2 times a day as directed   potassium chloride 10 MEQ tablet Commonly known as: KLOR-CON M Take 1 tablet (10 mEq total) by mouth daily for 15 days.   sodium chloride 1 g tablet Take 1 g by mouth daily.   SYSTANE ICAPS AREDS2 PO Take 1 capsule by mouth in the morning and at bedtime.   TYLENOL 500 MG tablet Generic drug: acetaminophen Take 500-1,000 mg by mouth every 6 (six) hours as needed for mild pain or headache.   Vitamin B Complex Tabs Take 1 tablet by mouth daily with breakfast.   Vitamin D3 25 MCG (  1000 UT) Caps Take 1,000 Units by mouth daily.        Follow-up Information      Kathalene Frames, MD Follow up in 1 week(s).   Specialty: Internal Medicine Contact information: [address], [place] [address] [phone]                  Time coordinating discharge: 39 minutes  Signed:  Lydiah Pong  [facility] 04/12/2022, 11:47 AM

## 2022-04-11 NOTE — Progress Notes (Signed)
PROGRESS NOTE    SHAMEKA AGGARWAL  DVV:[id number] DOB: 1929-10-08 DOA: 04/07/2022 PCP: Kathalene Frames, MD    Brief Narrative:  Ms. Hickmon, a  89 year old female with a past medical history of recurrent pericarditis, hypertension, thoracic aneurysm, anemia, CKD, aortic insufficiency with preserved EF in 2022 and chronic hyponatremia presented to the hospital with increasing fatigue and weakness.  She was recently treated for UTI.  In the ED, patient was slightly hypertensive with bilateral lower extremity edema.  COVID influenza and RSV was negative.  Labs showed mild hyponatremia with sodium of 130 with elevated BNP at 03/19/2011.  WBC was slightly elevated at 11.5 with hemoglobin of 8.0.  Chest x-ray was abnormal showing multifocal pneumonia versus pulmonary edema.  Patient was given 40 mg of Lasix and was admitted hospital for further evaluation of possible heart failure/generalized weakness and fatigue.  Assessment and plan.  Acute diastolic heart failure (HCC)  CXR revealed opacities favoring pulmonary edema vs multi-focal PNA.  Patient without fever but with mild leukocytosis and elevated BNP being more in favor of pulmonary congestion/CHF.  2D echocardiogram reviewed from 10/15/2020 with normal EF.  Patient did have crackles and elevated JVD on presentation so likely diastolic heart failure at this time.  Patient received IV Lasix during hospitalization.  Discontinued amlodipine. Patient has been started on Coreg and losartan. Denies previous history of congestive heart failure.  Repeat 2D echocardiogram from 04/09/2022 showed LV ejection fraction of 60 to 65% with grade 2 diastolic dysfunction.  Procal<0.3, will consider low-dose oral Lasix on discharge.  Chronic venous stasis dermatitis of both lower extremities Continue supportive care.  Could consider oral diuretic every other day on discharge.  Mild hyponatremia.  Present on admission.  Likely secondary to hypervolemic hyponatremia.   Sodium level today at 130   Advanced care planning/counseling discussion    DNR/DNI.    Consulted palliative care   History of chronic cystitis Patient recently treated for recurrent UTI . Urinalysis in ED with many bacteria but only 6-10 WBC/hpf.  Asymptomatic at this time. Patient was recently treated with ciprofloxacin.  WBC at 13.3.  Afebrile.   Anemia Long history of normocytic anemia.  Patient follows up with hematology as outpatient and last visit in November 2023 with hemoglobin of 9.2.  Hemoglobin today at 9.2.  [name] at baseline.  Patient continues on iron and vitamin B12.  MMA pending.  Iron low at 17 and received a dose of IV ferriclet '125mg'$ .  Has been seen by physical therapy who recommend home PT on discharge.  Patient is from assisted living facility.  Aortic insufficiency Last Cardiology visit with Dr. Daneen Schick August '23 - she does not want any intervention or further testing. Dr. Tamala Julian wanted better BP control and started amlodipine.  2D echocardiogram has been ordered.  Off amlodipine.  2D echocardiogram from 04/09/2022 with mild to moderate aortic valve regurgitation with sclerosis and ascending aortic dilatation at 41 mm.  Essential hypertension Continue losartan.  On Coreg 6.125 twice daily.  On Coreg 12.5 twice daily at home.  Has been started on Cardizem but amlodipine has been discontinued..  Blood pressure better today  Deconditioning, debility.  Spoke with the patient's son on the phone.  Wishes assisted living facility with home health.    DVT prophylaxis: enoxaparin (LOVENOX) injection 40 mg Start: 04/07/22 2200   Code Status:     Code Status: DNR  Disposition:  Currently at [facility] facility.  Family now wishes home health on discharge.  Plan for disposition on 04/13/2023.  Status is: Inpatient  Remains inpatient appropriate because: Congestive heart failure, anemia, deconditioning debility   Family Communication:  Spoke with the  patient's son on the phone on 04/11/2022 and updated him about the potential disposition home 1/29.  Consultants:  None  Procedures:  2D echocardiogram  Antimicrobials:  None  Anti-infectives (From admission, onward)    None       Subjective: Today, patient was seen and examined at bedside.  Denies interval complaints.  Denies any dizziness lightheadedness nausea vomiting fever chills or rigor.  Was able to rest okay.  Still complains of mild fatigue.  Objective: Vitals:   04/10/22 0847 04/10/22 1328 04/11/22 0418 04/11/22 1353  BP: (!) 170/85 (!) 141/68 (!) 148/68 116/66  Pulse: 88 78 66 65  Resp: '18 20 16 18  '$ Temp: 98 F (36.7 C)  97.9 F (36.6 C) 97.6 F (36.4 C)  TempSrc: Oral  Oral Oral  SpO2: 98% 98% 96% 100%  Weight:   62.9 kg   Height:        Intake/Output Summary (Last 24 hours) at 04/11/2022 1402 Last data filed at 04/11/2022 1302 Gross per 24 hour  Intake 600 ml  Output --  Net 600 ml    Filed Weights   04/08/22 1806 04/11/22 0418  Weight: 63.5 kg 62.9 kg    Physical Examination: Body mass index is 25.36 kg/m.   General: Alert awake and Communicative.  Not in obvious distress, elderly female.   HENT: Mild pallor noted, oral mucosa is moist.  Chest:    Diminished breath sounds noted.   CVS: S1 &S2 heard. No murmur.  Regular rate and rhythm. Abdomen: Soft, nontender, nondistended.  Bowel sounds are heard.   Extremities: Bilateral lower extremity edema erythema with venous stasis. Psych: Alert, awake and oriented, normal mood CNS:  No cranial nerve deficits.  Power equal in all extremities.   Skin: Warm and dry.  Bilateral lower extremity with edema, erythema and venous stasis.  Data Reviewed:   CBC: Recent Labs  Lab 04/07/22 1640 04/10/22 0736 04/11/22 0637  WBC 11.5* 11.3* 10.8*  HGB 8.0* 9.2* 8.2*  HCT 25.7* 28.4* 25.2*  MCV 86.2 84.3 84.8  PLT 340 408* 413*     Basic Metabolic Panel: Recent Labs  Lab 04/07/22 1640  04/08/22 0616 04/09/22 0554 04/10/22 0736 04/11/22 0637  NA 130* 132* 132* 133* 130*  K 4.6 3.8 3.6 4.0 4.0  CL 102 96* 93* 94* 93*  CO2 '22 26 28 26 26  '$ GLUCOSE 98 92 97 104* 98  BUN 25* 23 34* 30* 33*  CREATININE 0.87 0.92 1.16* 1.01* 0.97  CALCIUM 8.2* 8.4* 8.2* 8.7* 8.4*  MG  --   --   --  2.1 2.2     Liver Function Tests: Recent Labs  Lab 04/07/22 1640  AST 44*  ALT 30  ALKPHOS 89  BILITOT 0.4  PROT 7.1  ALBUMIN 3.1*      Radiology Studies: ECHOCARDIOGRAM COMPLETE  Result Date: 04/09/2022    ECHOCARDIOGRAM REPORT   Patient Name:   ANNELISA RYBACK Date of Exam: 04/09/2022 Medical Rec #:  [id number]         Height:       62.0 in Accession #:    [id number]        Weight:       140.0 lb Date of Birth:  [birth year]        BSA:  1.643 m Patient Age:    85 years          BP:           167/75 mmHg Patient Gender: F                 HR:           98 bpm. Exam Location:  Inpatient Procedure: 2D Echo, Cardiac Doppler and Color Doppler Indications:    CHF-Acute Diastolic Z16.96  History:        Patient has prior history of Echocardiogram examinations, most                 recent 10/15/2020. AI; Risk Factors:Hypertension and GERD.  Sonographer:    Bernadene Person RDCS Referring Phys: [place]  1. Left ventricular ejection fraction, by estimation, is 60 to 65%. The left ventricle has normal function. The left ventricle has no regional wall motion abnormalities. Left ventricular diastolic parameters are consistent with Grade II diastolic dysfunction (pseudonormalization). Elevated left ventricular end-diastolic pressure.  2. Right ventricular systolic function is normal. The right ventricular size is normal. There is normal pulmonary artery systolic pressure.  3. Left atrial size was severely dilated.  4. The mitral valve is abnormal. Trivial mitral valve regurgitation. No evidence of mitral stenosis.  5. The aortic valve is tricuspid. There is mild calcification  of the aortic valve. There is mild thickening of the aortic valve. Aortic valve regurgitation is mild to moderate. Aortic valve sclerosis is present, with no evidence of aortic valve stenosis.  6. Aortic dilatation noted. There is mild dilatation of the aortic root, measuring 38 mm. There is moderate dilatation of the ascending aorta, measuring 41 mm.  7. The inferior vena cava is normal in size with greater than 50% respiratory variability, suggesting right atrial pressure of 3 mmHg. FINDINGS  Left Ventricle: Left ventricular ejection fraction, by estimation, is 60 to 65%. The left ventricle has normal function. The left ventricle has no regional wall motion abnormalities. The left ventricular internal cavity size was normal in size. There is  no left ventricular hypertrophy. Left ventricular diastolic parameters are consistent with Grade II diastolic dysfunction (pseudonormalization). Elevated left ventricular end-diastolic pressure. Right Ventricle: The right ventricular size is normal. No increase in right ventricular wall thickness. Right ventricular systolic function is normal. There is normal pulmonary artery systolic pressure. The tricuspid regurgitant velocity is 2.19 m/s, and  with an assumed right atrial pressure of 8 mmHg, the estimated right ventricular systolic pressure is 78.9 mmHg. Left Atrium: Left atrial size was severely dilated. Right Atrium: Right atrial size was normal in size. Pericardium: There is no evidence of pericardial effusion. Mitral Valve: The mitral valve is abnormal. There is mild thickening of the mitral valve leaflet(s). There is mild calcification of the mitral valve leaflet(s). Trivial mitral valve regurgitation. No evidence of mitral valve stenosis. Tricuspid Valve: The tricuspid valve is normal in structure. Tricuspid valve regurgitation is mild . No evidence of tricuspid stenosis. Aortic Valve: The aortic valve is tricuspid. There is mild calcification of the aortic valve.  There is mild thickening of the aortic valve. Aortic valve regurgitation is mild to moderate. Aortic regurgitation PHT measures 320 msec. Aortic valve sclerosis  is present, with no evidence of aortic valve stenosis. Pulmonic Valve: The pulmonic valve was normal in structure. Pulmonic valve regurgitation is mild. No evidence of pulmonic stenosis. Aorta: Aortic dilatation noted. There is mild dilatation of the aortic root, measuring 38  mm. There is moderate dilatation of the ascending aorta, measuring 41 mm. Venous: The inferior vena cava is normal in size with greater than 50% respiratory variability, suggesting right atrial pressure of 3 mmHg. IAS/Shunts: No atrial level shunt detected by color flow Doppler.  LEFT VENTRICLE PLAX 2D LVIDd:         4.70 cm      Diastology LVIDs:         3.70 cm      LV e' medial:    6.09 cm/s LV PW:         0.90 cm      LV E/e' medial:  20.0 LV IVS:        0.90 cm      LV e' lateral:   8.27 cm/s LVOT diam:     2.10 cm      LV E/e' lateral: 14.8 LV SV:         88 LV SV Index:   54 LVOT Area:     3.46 cm  LV Volumes (MOD) LV vol d, MOD A2C: 96.3 ml LV vol d, MOD A4C: 107.0 ml LV vol s, MOD A2C: 52.4 ml LV vol s, MOD A4C: 48.6 ml LV SV MOD A2C:     43.9 ml LV SV MOD A4C:     107.0 ml LV SV MOD BP:      51.5 ml RIGHT VENTRICLE RV S prime:     18.20 cm/s TAPSE (M-mode): 2.7 cm LEFT ATRIUM              Index        RIGHT ATRIUM           Index LA diam:        5.30 cm  3.23 cm/m   RA Area:     17.20 cm LA Vol (A2C):   111.0 ml 67.57 ml/m  RA Volume:   40.40 ml  24.59 ml/m LA Vol (A4C):   96.7 ml  58.86 ml/m LA Biplane Vol: 110.0 ml 66.96 ml/m  AORTIC VALVE             PULMONIC VALVE LVOT Vmax:   161.00 cm/s PR End Diast Vel: 3.93 msec LVOT Vmean:  94.800 cm/s LVOT VTI:    0.255 m AI PHT:      320 msec  AORTA Ao Root diam: 3.80 cm Ao Asc diam:  4.10 cm MITRAL VALVE                TRICUSPID VALVE MV Area (PHT): 4.21 cm     TR Peak grad:   19.2 mmHg MV Decel Time: 180 msec     TR Vmax:         219.00 cm/s MV E velocity: 122.00 cm/s MV A velocity: 139.00 cm/s  SHUNTS MV E/A ratio:  0.88         Systemic VTI:  0.26 m                             Systemic Diam: 2.10 cm Jenkins Rouge MD Electronically signed by Jenkins Rouge MD Signature Date/Time: 04/09/2022/4:04:15 PM    Final       LOS: 4 days    Flora Lipps, MD [facility] Available via Epic secure chat 7am-7pm After these hours, please refer to coverage provider listed on [email or web address] 04/11/2022, 2:02 PM

## 2022-04-12 ENCOUNTER — Ambulatory Visit: Payer: Medicare Other

## 2022-04-12 ENCOUNTER — Encounter: Payer: Self-pay | Admitting: Hematology

## 2022-04-12 LAB — CBC
HCT: 27.3 % — ABNORMAL LOW (ref 36.0–46.0)
Hemoglobin: 8.7 g/dL — ABNORMAL LOW (ref 12.0–15.0)
MCH: 26.9 pg (ref 26.0–34.0)
MCHC: 31.9 g/dL (ref 30.0–36.0)
MCV: 84.5 fL (ref 80.0–100.0)
Platelets: 457 10*3/uL — ABNORMAL HIGH (ref 150–400)
RBC: 3.23 MIL/uL — ABNORMAL LOW (ref 3.87–5.11)
RDW: 15.5 % (ref 11.5–15.5)
WBC: 11.7 10*3/uL — ABNORMAL HIGH (ref 4.0–10.5)
nRBC: 0 % (ref 0.0–0.2)

## 2022-04-12 LAB — BASIC METABOLIC PANEL
Anion gap: 12 (ref 5–15)
BUN: 36 mg/dL — ABNORMAL HIGH (ref 8–23)
CO2: 24 mmol/L (ref 22–32)
Calcium: 8.5 mg/dL — ABNORMAL LOW (ref 8.9–10.3)
Chloride: 95 mmol/L — ABNORMAL LOW (ref 98–111)
Creatinine, Ser: 1.08 mg/dL — ABNORMAL HIGH (ref 0.44–1.00)
GFR, Estimated: 49 mL/min — ABNORMAL LOW (ref >60)
Glucose, Bld: 98 mg/dL (ref 70–99)
Potassium: 4.1 mmol/L (ref 3.5–5.1)
Sodium: 131 mmol/L — ABNORMAL LOW (ref 135–145)

## 2022-04-12 LAB — MAGNESIUM: Magnesium: 2.3 mg/dL (ref 1.7–2.4)

## 2022-04-12 MED ORDER — CARVEDILOL 12.5 MG PO TABS: 12.5000 mg | ORAL_TABLET | Freq: Two times a day (BID) | ORAL | 2 refills | Status: DC

## 2022-04-12 NOTE — Plan of Care (Signed)

## 2022-04-12 NOTE — Care Management Important Message (Signed)
Important Message  Patient Details IM Letter given. Name: [name] MRN: [id number] Date of Birth: January 19, 1930   Medicare Important Message Given:  Yes     Kerin Salen 04/12/2022, 10:51 AM

## 2022-04-14 LAB — METHYLMALONIC ACID, SERUM: Methylmalonic Acid, Quantitative: 392 nmol/L — ABNORMAL HIGH (ref 0–378)

## 2022-04-19 DIAGNOSIS — R2689 Other abnormalities of gait and mobility: Secondary | ICD-10-CM | POA: Diagnosis not present

## 2022-04-19 DIAGNOSIS — R2681 Unsteadiness on feet: Secondary | ICD-10-CM | POA: Diagnosis not present

## 2022-04-19 DIAGNOSIS — M6281 Muscle weakness (generalized): Secondary | ICD-10-CM | POA: Diagnosis not present

## 2022-04-22 DIAGNOSIS — M6281 Muscle weakness (generalized): Secondary | ICD-10-CM | POA: Diagnosis not present

## 2022-04-22 DIAGNOSIS — D631 Anemia in chronic kidney disease: Secondary | ICD-10-CM | POA: Diagnosis not present

## 2022-04-22 DIAGNOSIS — D638 Anemia in other chronic diseases classified elsewhere: Secondary | ICD-10-CM | POA: Diagnosis not present

## 2022-04-22 DIAGNOSIS — R2689 Other abnormalities of gait and mobility: Secondary | ICD-10-CM | POA: Diagnosis not present

## 2022-04-22 DIAGNOSIS — I5031 Acute diastolic (congestive) heart failure: Secondary | ICD-10-CM | POA: Diagnosis not present

## 2022-04-22 DIAGNOSIS — M79604 Pain in right leg: Secondary | ICD-10-CM | POA: Diagnosis not present

## 2022-04-22 DIAGNOSIS — R2681 Unsteadiness on feet: Secondary | ICD-10-CM | POA: Diagnosis not present

## 2022-04-22 DIAGNOSIS — I10 Essential (primary) hypertension: Secondary | ICD-10-CM | POA: Diagnosis not present

## 2022-04-22 DIAGNOSIS — M79605 Pain in left leg: Secondary | ICD-10-CM | POA: Diagnosis not present

## 2022-04-23 DIAGNOSIS — Z96642 Presence of left artificial hip joint: Secondary | ICD-10-CM | POA: Diagnosis not present

## 2022-04-26 ENCOUNTER — Ambulatory Visit (INDEPENDENT_AMBULATORY_CARE_PROVIDER_SITE_OTHER): Payer: Medicare Other | Admitting: Podiatry

## 2022-04-26 ENCOUNTER — Encounter: Payer: Self-pay | Admitting: Podiatry

## 2022-04-26 VITALS — BP 137/70 | HR 80

## 2022-04-26 DIAGNOSIS — M9902 Segmental and somatic dysfunction of thoracic region: Secondary | ICD-10-CM | POA: Diagnosis not present

## 2022-04-26 DIAGNOSIS — M9901 Segmental and somatic dysfunction of cervical region: Secondary | ICD-10-CM | POA: Diagnosis not present

## 2022-04-26 DIAGNOSIS — M9903 Segmental and somatic dysfunction of lumbar region: Secondary | ICD-10-CM | POA: Diagnosis not present

## 2022-04-26 DIAGNOSIS — B351 Tinea unguium: Secondary | ICD-10-CM | POA: Diagnosis not present

## 2022-04-26 DIAGNOSIS — M79675 Pain in left toe(s): Secondary | ICD-10-CM | POA: Diagnosis not present

## 2022-04-26 DIAGNOSIS — Q828 Other specified congenital malformations of skin: Secondary | ICD-10-CM | POA: Diagnosis not present

## 2022-04-26 DIAGNOSIS — M79674 Pain in right toe(s): Secondary | ICD-10-CM

## 2022-04-26 DIAGNOSIS — M4726 Other spondylosis with radiculopathy, lumbar region: Secondary | ICD-10-CM | POA: Diagnosis not present

## 2022-04-26 NOTE — Progress Notes (Signed)
This patient returns to the office for evaluation and treatment of long thick painful nails .  This patient is unable to trim his own nails since the patient cannot reach his feet.  Patient says the nails are painful walking and wearing his shoes.   She has painful callus on the bottom of her right big toe. She returns for preventive foot care services.  General Appearance  Alert, conversant and in no acute stress.  Vascular  Dorsalis pedis and posterior tibial  pulses are palpable  bilaterally.  Capillary return is within normal limits  bilaterally. Temperature is within normal limits  bilaterally.  Neurologic  Senn-Weinstein monofilament wire test within normal limits  bilaterally. Muscle power within normal limits bilaterally.  Nails Thick disfigured discolored nails with subungual debris  from hallux to fifth toes bilaterally. No evidence of bacterial infection or drainage bilaterally. Pincer fourth toenails  B/l.  Orthopedic  No limitations of motion  feet .  No crepitus or effusions noted.  No bony pathology or digital deformities noted. 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  Skin  normotropic  bilaterally.  No signs of infections or ulcers noted.     symptomatic callus right hallux  Onychomycosis  Pain in toes right foot  Pain in toes left foot  Callus right foot.  Debridement  of nails  1-5  B/L with a nail nipper.  Nails were then filed using a dremel tool with no incidents.      RTC 9 weeks     Gardiner Barefoot DPM

## 2022-04-27 DIAGNOSIS — R2689 Other abnormalities of gait and mobility: Secondary | ICD-10-CM | POA: Diagnosis not present

## 2022-04-27 DIAGNOSIS — M6281 Muscle weakness (generalized): Secondary | ICD-10-CM | POA: Diagnosis not present

## 2022-04-27 DIAGNOSIS — R2681 Unsteadiness on feet: Secondary | ICD-10-CM | POA: Diagnosis not present

## 2022-04-28 DIAGNOSIS — L282 Other prurigo: Secondary | ICD-10-CM | POA: Diagnosis not present

## 2022-04-28 DIAGNOSIS — L309 Dermatitis, unspecified: Secondary | ICD-10-CM | POA: Diagnosis not present

## 2022-04-28 DIAGNOSIS — Z85828 Personal history of other malignant neoplasm of skin: Secondary | ICD-10-CM | POA: Diagnosis not present

## 2022-04-29 DIAGNOSIS — R2681 Unsteadiness on feet: Secondary | ICD-10-CM | POA: Diagnosis not present

## 2022-04-29 DIAGNOSIS — R2689 Other abnormalities of gait and mobility: Secondary | ICD-10-CM | POA: Diagnosis not present

## 2022-04-29 DIAGNOSIS — M6281 Muscle weakness (generalized): Secondary | ICD-10-CM | POA: Diagnosis not present

## 2022-04-30 DIAGNOSIS — R2681 Unsteadiness on feet: Secondary | ICD-10-CM | POA: Diagnosis not present

## 2022-04-30 DIAGNOSIS — M6281 Muscle weakness (generalized): Secondary | ICD-10-CM | POA: Diagnosis not present

## 2022-04-30 DIAGNOSIS — R2689 Other abnormalities of gait and mobility: Secondary | ICD-10-CM | POA: Diagnosis not present

## 2022-05-03 DIAGNOSIS — M9903 Segmental and somatic dysfunction of lumbar region: Secondary | ICD-10-CM | POA: Diagnosis not present

## 2022-05-03 DIAGNOSIS — M9901 Segmental and somatic dysfunction of cervical region: Secondary | ICD-10-CM | POA: Diagnosis not present

## 2022-05-03 DIAGNOSIS — M9902 Segmental and somatic dysfunction of thoracic region: Secondary | ICD-10-CM | POA: Diagnosis not present

## 2022-05-03 DIAGNOSIS — M4726 Other spondylosis with radiculopathy, lumbar region: Secondary | ICD-10-CM | POA: Diagnosis not present

## 2022-05-04 DIAGNOSIS — R2689 Other abnormalities of gait and mobility: Secondary | ICD-10-CM | POA: Diagnosis not present

## 2022-05-04 DIAGNOSIS — R2681 Unsteadiness on feet: Secondary | ICD-10-CM | POA: Diagnosis not present

## 2022-05-04 DIAGNOSIS — M6281 Muscle weakness (generalized): Secondary | ICD-10-CM | POA: Diagnosis not present

## 2022-05-06 DIAGNOSIS — M6281 Muscle weakness (generalized): Secondary | ICD-10-CM | POA: Diagnosis not present

## 2022-05-06 DIAGNOSIS — R2689 Other abnormalities of gait and mobility: Secondary | ICD-10-CM | POA: Diagnosis not present

## 2022-05-06 DIAGNOSIS — R2681 Unsteadiness on feet: Secondary | ICD-10-CM | POA: Diagnosis not present

## 2022-05-10 DIAGNOSIS — M6281 Muscle weakness (generalized): Secondary | ICD-10-CM | POA: Diagnosis not present

## 2022-05-10 DIAGNOSIS — M4726 Other spondylosis with radiculopathy, lumbar region: Secondary | ICD-10-CM | POA: Diagnosis not present

## 2022-05-10 DIAGNOSIS — R2689 Other abnormalities of gait and mobility: Secondary | ICD-10-CM | POA: Diagnosis not present

## 2022-05-10 DIAGNOSIS — M9901 Segmental and somatic dysfunction of cervical region: Secondary | ICD-10-CM | POA: Diagnosis not present

## 2022-05-10 DIAGNOSIS — M9902 Segmental and somatic dysfunction of thoracic region: Secondary | ICD-10-CM | POA: Diagnosis not present

## 2022-05-10 DIAGNOSIS — R2681 Unsteadiness on feet: Secondary | ICD-10-CM | POA: Diagnosis not present

## 2022-05-10 DIAGNOSIS — M9903 Segmental and somatic dysfunction of lumbar region: Secondary | ICD-10-CM | POA: Diagnosis not present

## 2022-05-12 ENCOUNTER — Ambulatory Visit
Admission: RE | Admit: 2022-05-12 | Discharge: 2022-05-12 | Disposition: A | Discharge status: Home / Self Care | Payer: Medicare Other | Source: Ambulatory Visit | Attending: Internal Medicine | Admitting: Internal Medicine

## 2022-05-12 ENCOUNTER — Other Ambulatory Visit: Payer: Self-pay | Admitting: Internal Medicine

## 2022-05-12 DIAGNOSIS — R918 Other nonspecific abnormal finding of lung field: Secondary | ICD-10-CM

## 2022-05-12 DIAGNOSIS — I771 Stricture of artery: Secondary | ICD-10-CM | POA: Diagnosis not present

## 2022-05-12 DIAGNOSIS — I5031 Acute diastolic (congestive) heart failure: Secondary | ICD-10-CM | POA: Diagnosis not present

## 2022-05-12 DIAGNOSIS — D638 Anemia in other chronic diseases classified elsewhere: Secondary | ICD-10-CM | POA: Diagnosis not present

## 2022-05-12 DIAGNOSIS — R700 Elevated erythrocyte sedimentation rate: Secondary | ICD-10-CM | POA: Diagnosis not present

## 2022-05-12 DIAGNOSIS — M47814 Spondylosis without myelopathy or radiculopathy, thoracic region: Secondary | ICD-10-CM | POA: Diagnosis not present

## 2022-05-12 DIAGNOSIS — M1030 Gout due to renal impairment, unspecified site: Secondary | ICD-10-CM | POA: Diagnosis not present

## 2022-05-12 DIAGNOSIS — I10 Essential (primary) hypertension: Secondary | ICD-10-CM | POA: Diagnosis not present

## 2022-05-12 DIAGNOSIS — M79605 Pain in left leg: Secondary | ICD-10-CM | POA: Diagnosis not present

## 2022-05-12 DIAGNOSIS — M19011 Primary osteoarthritis, right shoulder: Secondary | ICD-10-CM | POA: Diagnosis not present

## 2022-05-12 DIAGNOSIS — M79604 Pain in right leg: Secondary | ICD-10-CM | POA: Diagnosis not present

## 2022-05-12 DIAGNOSIS — M419 Scoliosis, unspecified: Secondary | ICD-10-CM | POA: Diagnosis not present

## 2022-05-13 DIAGNOSIS — R2681 Unsteadiness on feet: Secondary | ICD-10-CM | POA: Diagnosis not present

## 2022-05-13 DIAGNOSIS — M6281 Muscle weakness (generalized): Secondary | ICD-10-CM | POA: Diagnosis not present

## 2022-05-13 DIAGNOSIS — R2689 Other abnormalities of gait and mobility: Secondary | ICD-10-CM | POA: Diagnosis not present

## 2022-05-14 ENCOUNTER — Encounter: Payer: Self-pay | Admitting: Family Medicine

## 2022-05-17 DIAGNOSIS — R488 Other symbolic dysfunctions: Secondary | ICD-10-CM | POA: Diagnosis not present

## 2022-05-17 DIAGNOSIS — R2689 Other abnormalities of gait and mobility: Secondary | ICD-10-CM | POA: Diagnosis not present

## 2022-05-17 DIAGNOSIS — R2681 Unsteadiness on feet: Secondary | ICD-10-CM | POA: Diagnosis not present

## 2022-05-17 DIAGNOSIS — M6281 Muscle weakness (generalized): Secondary | ICD-10-CM | POA: Diagnosis not present

## 2022-05-19 ENCOUNTER — Ambulatory Visit: Payer: Medicare Other

## 2022-05-19 DIAGNOSIS — R2689 Other abnormalities of gait and mobility: Secondary | ICD-10-CM | POA: Diagnosis not present

## 2022-05-19 DIAGNOSIS — M6281 Muscle weakness (generalized): Secondary | ICD-10-CM | POA: Diagnosis not present

## 2022-05-19 DIAGNOSIS — R2681 Unsteadiness on feet: Secondary | ICD-10-CM | POA: Diagnosis not present

## 2022-05-19 DIAGNOSIS — R488 Other symbolic dysfunctions: Secondary | ICD-10-CM | POA: Diagnosis not present

## 2022-05-24 DIAGNOSIS — M9901 Segmental and somatic dysfunction of cervical region: Secondary | ICD-10-CM | POA: Diagnosis not present

## 2022-05-24 DIAGNOSIS — M9903 Segmental and somatic dysfunction of lumbar region: Secondary | ICD-10-CM | POA: Diagnosis not present

## 2022-05-24 DIAGNOSIS — R2689 Other abnormalities of gait and mobility: Secondary | ICD-10-CM | POA: Diagnosis not present

## 2022-05-24 DIAGNOSIS — M9902 Segmental and somatic dysfunction of thoracic region: Secondary | ICD-10-CM | POA: Diagnosis not present

## 2022-05-24 DIAGNOSIS — R488 Other symbolic dysfunctions: Secondary | ICD-10-CM | POA: Diagnosis not present

## 2022-05-24 DIAGNOSIS — R2681 Unsteadiness on feet: Secondary | ICD-10-CM | POA: Diagnosis not present

## 2022-05-24 DIAGNOSIS — M4726 Other spondylosis with radiculopathy, lumbar region: Secondary | ICD-10-CM | POA: Diagnosis not present

## 2022-05-24 DIAGNOSIS — M6281 Muscle weakness (generalized): Secondary | ICD-10-CM | POA: Diagnosis not present

## 2022-05-26 DIAGNOSIS — D485 Neoplasm of uncertain behavior of skin: Secondary | ICD-10-CM | POA: Diagnosis not present

## 2022-05-26 DIAGNOSIS — C44729 Squamous cell carcinoma of skin of left lower limb, including hip: Secondary | ICD-10-CM | POA: Diagnosis not present

## 2022-05-26 DIAGNOSIS — Z85828 Personal history of other malignant neoplasm of skin: Secondary | ICD-10-CM | POA: Diagnosis not present

## 2022-05-26 DIAGNOSIS — L309 Dermatitis, unspecified: Secondary | ICD-10-CM | POA: Diagnosis not present

## 2022-05-27 DIAGNOSIS — R488 Other symbolic dysfunctions: Secondary | ICD-10-CM | POA: Diagnosis not present

## 2022-05-27 DIAGNOSIS — M6281 Muscle weakness (generalized): Secondary | ICD-10-CM | POA: Diagnosis not present

## 2022-05-27 DIAGNOSIS — R2689 Other abnormalities of gait and mobility: Secondary | ICD-10-CM | POA: Diagnosis not present

## 2022-05-27 DIAGNOSIS — R2681 Unsteadiness on feet: Secondary | ICD-10-CM | POA: Diagnosis not present

## 2022-05-31 DIAGNOSIS — M4726 Other spondylosis with radiculopathy, lumbar region: Secondary | ICD-10-CM | POA: Diagnosis not present

## 2022-05-31 DIAGNOSIS — M9902 Segmental and somatic dysfunction of thoracic region: Secondary | ICD-10-CM | POA: Diagnosis not present

## 2022-05-31 DIAGNOSIS — M9901 Segmental and somatic dysfunction of cervical region: Secondary | ICD-10-CM | POA: Diagnosis not present

## 2022-05-31 DIAGNOSIS — M9903 Segmental and somatic dysfunction of lumbar region: Secondary | ICD-10-CM | POA: Diagnosis not present

## 2022-06-01 DIAGNOSIS — R2689 Other abnormalities of gait and mobility: Secondary | ICD-10-CM | POA: Diagnosis not present

## 2022-06-01 DIAGNOSIS — R2681 Unsteadiness on feet: Secondary | ICD-10-CM | POA: Diagnosis not present

## 2022-06-01 DIAGNOSIS — M6281 Muscle weakness (generalized): Secondary | ICD-10-CM | POA: Diagnosis not present

## 2022-06-01 DIAGNOSIS — R488 Other symbolic dysfunctions: Secondary | ICD-10-CM | POA: Diagnosis not present

## 2022-06-03 DIAGNOSIS — M6281 Muscle weakness (generalized): Secondary | ICD-10-CM | POA: Diagnosis not present

## 2022-06-03 DIAGNOSIS — R2689 Other abnormalities of gait and mobility: Secondary | ICD-10-CM | POA: Diagnosis not present

## 2022-06-03 DIAGNOSIS — R2681 Unsteadiness on feet: Secondary | ICD-10-CM | POA: Diagnosis not present

## 2022-06-03 DIAGNOSIS — R488 Other symbolic dysfunctions: Secondary | ICD-10-CM | POA: Diagnosis not present

## 2022-06-04 DIAGNOSIS — E871 Hypo-osmolality and hyponatremia: Secondary | ICD-10-CM | POA: Diagnosis not present

## 2022-06-04 DIAGNOSIS — D638 Anemia in other chronic diseases classified elsewhere: Secondary | ICD-10-CM | POA: Diagnosis not present

## 2022-06-04 DIAGNOSIS — I351 Nonrheumatic aortic (valve) insufficiency: Secondary | ICD-10-CM | POA: Diagnosis not present

## 2022-06-04 DIAGNOSIS — R5382 Chronic fatigue, unspecified: Secondary | ICD-10-CM | POA: Diagnosis not present

## 2022-06-07 DIAGNOSIS — M4726 Other spondylosis with radiculopathy, lumbar region: Secondary | ICD-10-CM | POA: Diagnosis not present

## 2022-06-07 DIAGNOSIS — M9901 Segmental and somatic dysfunction of cervical region: Secondary | ICD-10-CM | POA: Diagnosis not present

## 2022-06-07 DIAGNOSIS — M9903 Segmental and somatic dysfunction of lumbar region: Secondary | ICD-10-CM | POA: Diagnosis not present

## 2022-06-07 DIAGNOSIS — M9902 Segmental and somatic dysfunction of thoracic region: Secondary | ICD-10-CM | POA: Diagnosis not present

## 2022-06-09 ENCOUNTER — Other Ambulatory Visit: Payer: Self-pay | Admitting: Internal Medicine

## 2022-06-09 DIAGNOSIS — R0609 Other forms of dyspnea: Secondary | ICD-10-CM | POA: Diagnosis not present

## 2022-06-09 DIAGNOSIS — E871 Hypo-osmolality and hyponatremia: Secondary | ICD-10-CM | POA: Diagnosis not present

## 2022-06-09 DIAGNOSIS — R0602 Shortness of breath: Secondary | ICD-10-CM

## 2022-06-09 DIAGNOSIS — R0902 Hypoxemia: Secondary | ICD-10-CM | POA: Diagnosis not present

## 2022-06-09 DIAGNOSIS — R700 Elevated erythrocyte sedimentation rate: Secondary | ICD-10-CM | POA: Diagnosis not present

## 2022-06-09 DIAGNOSIS — J849 Interstitial pulmonary disease, unspecified: Secondary | ICD-10-CM | POA: Diagnosis not present

## 2022-06-09 DIAGNOSIS — R5382 Chronic fatigue, unspecified: Secondary | ICD-10-CM | POA: Diagnosis not present

## 2022-06-09 DIAGNOSIS — D638 Anemia in other chronic diseases classified elsewhere: Secondary | ICD-10-CM | POA: Diagnosis not present

## 2022-06-09 DIAGNOSIS — Z8739 Personal history of other diseases of the musculoskeletal system and connective tissue: Secondary | ICD-10-CM | POA: Diagnosis not present

## 2022-06-10 DIAGNOSIS — M6281 Muscle weakness (generalized): Secondary | ICD-10-CM | POA: Diagnosis not present

## 2022-06-10 DIAGNOSIS — R2681 Unsteadiness on feet: Secondary | ICD-10-CM | POA: Diagnosis not present

## 2022-06-10 DIAGNOSIS — R488 Other symbolic dysfunctions: Secondary | ICD-10-CM | POA: Diagnosis not present

## 2022-06-10 DIAGNOSIS — R2689 Other abnormalities of gait and mobility: Secondary | ICD-10-CM | POA: Diagnosis not present

## 2022-06-12 ENCOUNTER — Other Ambulatory Visit: Payer: Self-pay

## 2022-06-12 ENCOUNTER — Emergency Department (HOSPITAL_COMMUNITY): Payer: Medicare Other

## 2022-06-12 ENCOUNTER — Encounter (HOSPITAL_COMMUNITY): Payer: Self-pay

## 2022-06-12 ENCOUNTER — Inpatient Hospital Stay (HOSPITAL_COMMUNITY)
Admission: EM | Admit: 2022-06-12 | Discharge: 2022-06-14 | DRG: 194 | Disposition: A | Discharge status: Hospice | Payer: Medicare Other | Attending: Internal Medicine | Admitting: Internal Medicine

## 2022-06-12 DIAGNOSIS — E871 Hypo-osmolality and hyponatremia: Secondary | ICD-10-CM | POA: Diagnosis present

## 2022-06-12 DIAGNOSIS — J849 Interstitial pulmonary disease, unspecified: Secondary | ICD-10-CM | POA: Diagnosis present

## 2022-06-12 DIAGNOSIS — J13 Pneumonia due to Streptococcus pneumoniae: Secondary | ICD-10-CM | POA: Diagnosis not present

## 2022-06-12 DIAGNOSIS — Z79899 Other long term (current) drug therapy: Secondary | ICD-10-CM | POA: Diagnosis not present

## 2022-06-12 DIAGNOSIS — Z8249 Family history of ischemic heart disease and other diseases of the circulatory system: Secondary | ICD-10-CM

## 2022-06-12 DIAGNOSIS — Z66 Do not resuscitate: Secondary | ICD-10-CM | POA: Diagnosis present

## 2022-06-12 DIAGNOSIS — Z888 Allergy status to other drugs, medicaments and biological substances status: Secondary | ICD-10-CM

## 2022-06-12 DIAGNOSIS — Z91018 Allergy to other foods: Secondary | ICD-10-CM | POA: Diagnosis not present

## 2022-06-12 DIAGNOSIS — N1831 Chronic kidney disease, stage 3a: Secondary | ICD-10-CM | POA: Diagnosis present

## 2022-06-12 DIAGNOSIS — I110 Hypertensive heart disease with heart failure: Secondary | ICD-10-CM | POA: Diagnosis not present

## 2022-06-12 DIAGNOSIS — Z87440 Personal history of urinary (tract) infections: Secondary | ICD-10-CM | POA: Diagnosis not present

## 2022-06-12 DIAGNOSIS — Z515 Encounter for palliative care: Secondary | ICD-10-CM

## 2022-06-12 DIAGNOSIS — I351 Nonrheumatic aortic (valve) insufficiency: Secondary | ICD-10-CM | POA: Diagnosis not present

## 2022-06-12 DIAGNOSIS — J9690 Respiratory failure, unspecified, unspecified whether with hypoxia or hypercapnia: Secondary | ICD-10-CM | POA: Diagnosis not present

## 2022-06-12 DIAGNOSIS — R0902 Hypoxemia: Secondary | ICD-10-CM | POA: Diagnosis present

## 2022-06-12 DIAGNOSIS — I5032 Chronic diastolic (congestive) heart failure: Secondary | ICD-10-CM | POA: Diagnosis present

## 2022-06-12 DIAGNOSIS — I509 Heart failure, unspecified: Principal | ICD-10-CM

## 2022-06-12 DIAGNOSIS — J159 Unspecified bacterial pneumonia: Secondary | ICD-10-CM | POA: Diagnosis not present

## 2022-06-12 DIAGNOSIS — Z87891 Personal history of nicotine dependence: Secondary | ICD-10-CM | POA: Diagnosis not present

## 2022-06-12 DIAGNOSIS — N1832 Chronic kidney disease, stage 3b: Secondary | ICD-10-CM | POA: Diagnosis not present

## 2022-06-12 DIAGNOSIS — I319 Disease of pericardium, unspecified: Secondary | ICD-10-CM | POA: Diagnosis not present

## 2022-06-12 DIAGNOSIS — J8410 Pulmonary fibrosis, unspecified: Secondary | ICD-10-CM | POA: Diagnosis not present

## 2022-06-12 DIAGNOSIS — Z96653 Presence of artificial knee joint, bilateral: Secondary | ICD-10-CM | POA: Diagnosis present

## 2022-06-12 DIAGNOSIS — D631 Anemia in chronic kidney disease: Secondary | ICD-10-CM | POA: Diagnosis not present

## 2022-06-12 DIAGNOSIS — J189 Pneumonia, unspecified organism: Secondary | ICD-10-CM | POA: Diagnosis not present

## 2022-06-12 DIAGNOSIS — Z881 Allergy status to other antibiotic agents status: Secondary | ICD-10-CM | POA: Diagnosis not present

## 2022-06-12 DIAGNOSIS — H3530 Unspecified macular degeneration: Secondary | ICD-10-CM | POA: Diagnosis not present

## 2022-06-12 DIAGNOSIS — I2510 Atherosclerotic heart disease of native coronary artery without angina pectoris: Secondary | ICD-10-CM | POA: Diagnosis not present

## 2022-06-12 DIAGNOSIS — Z7982 Long term (current) use of aspirin: Secondary | ICD-10-CM

## 2022-06-12 DIAGNOSIS — I712 Thoracic aortic aneurysm, without rupture, unspecified: Secondary | ICD-10-CM | POA: Diagnosis present

## 2022-06-12 DIAGNOSIS — I872 Venous insufficiency (chronic) (peripheral): Secondary | ICD-10-CM | POA: Diagnosis not present

## 2022-06-12 DIAGNOSIS — M1990 Unspecified osteoarthritis, unspecified site: Secondary | ICD-10-CM | POA: Diagnosis not present

## 2022-06-12 DIAGNOSIS — Z1152 Encounter for screening for COVID-19: Secondary | ICD-10-CM | POA: Diagnosis not present

## 2022-06-12 DIAGNOSIS — Z823 Family history of stroke: Secondary | ICD-10-CM

## 2022-06-12 DIAGNOSIS — R0602 Shortness of breath: Secondary | ICD-10-CM | POA: Diagnosis not present

## 2022-06-12 DIAGNOSIS — I130 Hypertensive heart and chronic kidney disease with heart failure and stage 1 through stage 4 chronic kidney disease, or unspecified chronic kidney disease: Secondary | ICD-10-CM | POA: Diagnosis present

## 2022-06-12 DIAGNOSIS — M109 Gout, unspecified: Secondary | ICD-10-CM | POA: Diagnosis not present

## 2022-06-12 DIAGNOSIS — Z85828 Personal history of other malignant neoplasm of skin: Secondary | ICD-10-CM

## 2022-06-12 DIAGNOSIS — Z91013 Allergy to seafood: Secondary | ICD-10-CM

## 2022-06-12 DIAGNOSIS — Z882 Allergy status to sulfonamides status: Secondary | ICD-10-CM | POA: Diagnosis not present

## 2022-06-12 DIAGNOSIS — J479 Bronchiectasis, uncomplicated: Secondary | ICD-10-CM | POA: Diagnosis not present

## 2022-06-12 DIAGNOSIS — Z96642 Presence of left artificial hip joint: Secondary | ICD-10-CM | POA: Diagnosis present

## 2022-06-12 DIAGNOSIS — Z90710 Acquired absence of both cervix and uterus: Secondary | ICD-10-CM

## 2022-06-12 DIAGNOSIS — Z7189 Other specified counseling: Secondary | ICD-10-CM | POA: Diagnosis not present

## 2022-06-12 DIAGNOSIS — J84112 Idiopathic pulmonary fibrosis: Secondary | ICD-10-CM | POA: Diagnosis not present

## 2022-06-12 DIAGNOSIS — F32A Depression, unspecified: Secondary | ICD-10-CM | POA: Diagnosis not present

## 2022-06-12 LAB — CBC WITH DIFFERENTIAL/PLATELET
Abs Immature Granulocytes: 0.06 10*3/uL (ref 0.00–0.07)
Basophils Absolute: 0.1 10*3/uL (ref 0.0–0.1)
Basophils Relative: 1 %
Eosinophils Absolute: 0.4 10*3/uL (ref 0.0–0.5)
Eosinophils Relative: 6 %
HCT: 40.9 % (ref 36.0–46.0)
Hemoglobin: 12.9 g/dL (ref 12.0–15.0)
Immature Granulocytes: 1 %
Lymphocytes Relative: 12 %
Lymphs Abs: 0.8 10*3/uL (ref 0.7–4.0)
MCH: 26.6 pg (ref 26.0–34.0)
MCHC: 31.5 g/dL (ref 30.0–36.0)
MCV: 84.3 fL (ref 80.0–100.0)
Monocytes Absolute: 1 10*3/uL (ref 0.1–1.0)
Monocytes Relative: 16 %
Neutro Abs: 4.1 10*3/uL (ref 1.7–7.7)
Neutrophils Relative %: 64 %
Platelets: 293 10*3/uL (ref 150–400)
RBC: 4.85 MIL/uL (ref 3.87–5.11)
RDW: 15.9 % — ABNORMAL HIGH (ref 11.5–15.5)
WBC: 6.3 10*3/uL (ref 4.0–10.5)
nRBC: 0 % (ref 0.0–0.2)

## 2022-06-12 LAB — SARS CORONAVIRUS 2 BY RT PCR: SARS Coronavirus 2 by RT PCR: NEGATIVE

## 2022-06-12 LAB — COMPREHENSIVE METABOLIC PANEL
ALT: 19 U/L (ref 0–44)
AST: 30 U/L (ref 15–41)
Albumin: 3 g/dL — ABNORMAL LOW (ref 3.5–5.0)
Alkaline Phosphatase: 76 U/L (ref 38–126)
Anion gap: 12 (ref 5–15)
BUN: 36 mg/dL — ABNORMAL HIGH (ref 8–23)
CO2: 23 mmol/L (ref 22–32)
Calcium: 9.1 mg/dL (ref 8.9–10.3)
Chloride: 95 mmol/L — ABNORMAL LOW (ref 98–111)
Creatinine, Ser: 1.19 mg/dL — ABNORMAL HIGH (ref 0.44–1.00)
GFR, Estimated: 43 mL/min — ABNORMAL LOW (ref >60)
Glucose, Bld: 107 mg/dL — ABNORMAL HIGH (ref 70–99)
Potassium: 4.7 mmol/L (ref 3.5–5.1)
Sodium: 130 mmol/L — ABNORMAL LOW (ref 135–145)
Total Bilirubin: 0.3 mg/dL (ref 0.3–1.2)
Total Protein: 7.3 g/dL (ref 6.5–8.1)

## 2022-06-12 LAB — MAGNESIUM: Magnesium: 2.2 mg/dL (ref 1.7–2.4)

## 2022-06-12 LAB — TROPONIN I (HIGH SENSITIVITY)
Troponin I (High Sensitivity): 11 ng/L (ref <18)
Troponin I (High Sensitivity): 8 ng/L (ref <18)

## 2022-06-12 LAB — BRAIN NATRIURETIC PEPTIDE: B Natriuretic Peptide: 195.3 pg/mL — ABNORMAL HIGH (ref 0.0–100.0)

## 2022-06-12 MED ORDER — HEPARIN SODIUM (PORCINE) 5000 UNIT/ML IJ SOLN
5000.0000 [IU] | Freq: Three times a day (TID) | INTRAMUSCULAR | Status: DC
  Administered 2022-06-13 – 2022-06-14 (×2): 5000 [IU] via SUBCUTANEOUS
  Filled 2022-06-12 (×2): qty 1

## 2022-06-12 MED ORDER — DOXYCYCLINE HYCLATE 100 MG PO TABS
100.0000 mg | ORAL_TABLET | Freq: Once | ORAL | Status: AC
  Administered 2022-06-12: 100 mg via ORAL
  Filled 2022-06-12: qty 1

## 2022-06-12 MED ORDER — ONDANSETRON HCL 4 MG PO TABS: 4.0000 mg | ORAL_TABLET | Freq: Four times a day (QID) | ORAL | Status: DC | PRN

## 2022-06-12 MED ORDER — FUROSEMIDE 10 MG/ML IJ SOLN
20.0000 mg | Freq: Once | INTRAMUSCULAR | Status: AC
  Administered 2022-06-12: 20 mg via INTRAVENOUS
  Filled 2022-06-12: qty 2

## 2022-06-12 MED ORDER — ALBUTEROL SULFATE (2.5 MG/3ML) 0.083% IN NEBU: 2.5000 mg | INHALATION_SOLUTION | RESPIRATORY_TRACT | Status: DC | PRN

## 2022-06-12 MED ORDER — ONDANSETRON HCL 4 MG/2ML IJ SOLN: 4.0000 mg | Freq: Four times a day (QID) | INTRAMUSCULAR | Status: DC | PRN

## 2022-06-12 MED ORDER — ACETAMINOPHEN 650 MG RE SUPP: 650.0000 mg | Freq: Four times a day (QID) | RECTAL | Status: DC | PRN

## 2022-06-12 MED ORDER — ACETAMINOPHEN 325 MG PO TABS: 650.0000 mg | ORAL_TABLET | Freq: Four times a day (QID) | ORAL | Status: DC | PRN

## 2022-06-12 NOTE — H&P (Incomplete)
History and Physical    [name] [id number] DOB: 12-05-1929 DOA: 06/12/2022  PCP: Kathalene Frames, MD  Patient coming from: home  I have personally briefly reviewed patient's old medical records in [place]  Chief Complaint: sob x   HPI: [name] is a 89 y.o. female with medical history significant of recurrent pericarditis, hypertension, thoracic aneurysm, anemia, CKD, aortic insufficiency with preserved EF in 2022 and chronic hyponatremia who presents to ED BIB EMS due to progressive sob  over the last week with worsening symptoms over the last day.Per patient she was seen by pcp around 3-4 days ago and was prescribed home O2 but has not gotten home O2 as of yet.  In any event she noted associated mild productive cough , but denies fever/chills/ n/v/ abdominal or chest pain.  Of note in the field per EMS patient was hypoxic to 84 % she was place don 2L with resolution of hypoxemia.   IN ED  Vitals: Afeb,  bp 117/78, hr 78, rr 22, sat 98%  EKG: sinus rhythm, LAE Labs: Wbc 6.3, hgb 12.9, plt 293 ,  Bnp 195 ( prior 413) Na 130( at baseline) k 4.7, cr 1.19 close to base 0.9 CE 11 Cxr: opacity in rul ( pna )  1. Asymmetric increased opacification within the periphery of the right upper lobe and left midlung suspicious for superimposed pneumonia. 2. Chronic lung disease.  Of note patient note to have desat with ambulation in ED   CTPA pending   Tx doxcycline, lasix 20     Review of Systems: As per HPI otherwise 10 point review of systems negative.   Past Medical History:  Diagnosis Date   Anemia    CHRONIC   Aortic insufficiency    a. 2D echo 08/29/15: EF 55-60%, mild LVH, diastolic dysfunction, elevated LV filling pressure, mild AI, severe LAE, mild RAE, mild TR, dilated descending thoracic aorta just distal to the takeoff of the left subclavian, measuring 4.1 cm, no pericardial effusion.   Arthritis    OA-SOME BACK AND NECK PAIN,  GOES  TO CHIROPRACTOR TWICE A MONTH;  HX OF JOINT REPLACEMENTS    Cancer ([place])    SKIN CANCERS REMOVED FROM LEGS   CKD (chronic kidney disease), stage II    Depression    Essential hypertension    Gait abnormality 09/08/2017   GERD (gastroesophageal reflux disease)    Low sodium levels    Myocarditis (HCC)    PSVT (paroxysmal supraventricular tachycardia)    Recurrent Pericarditis    RECURRENT    Thoracic aneurysm    a. Followed by [facility] -last seen in 12/2013 w/ plan for f/u MRA and thoracic surgery f/u in 2 yrs, but patient does not wish to follow any longer.    Past Surgical History:  Procedure Laterality Date   APPENDECTOMY     BREAST EXCISIONAL BIOPSY Left 1992   benign   EXCISION/RELEASE BURSA HIP Left 11/24/2012   Procedure: LEFT HIP BURSECTOMY AND TENDON REPAIR ;  Surgeon: Gearlean Alf, MD;  Location: [facility];  Service: Orthopedics;  Laterality: Left;   HIP CLOSED REDUCTION Left 12/19/2012   Procedure: CLOSED MANIPULATION HIP;  Surgeon: Mauri Pole, MD;  Location: [facility];  Service: Orthopedics;  Laterality: Left;   HIP CLOSED REDUCTION Left 12/05/2013   Procedure: CLOSED MANIPULATION HIP;  Surgeon: Augustin Schooling, MD;  Location: [facility];  Service: Orthopedics;  Laterality: Left;   HIP CLOSED REDUCTION Left 09/22/2015  Procedure: CLOSED REDUCTION HIP;  Surgeon: Melina Schools, MD;  Location: [facility];  Service: Orthopedics;  Laterality: Left;   JOINT REPLACEMENT     LEFT TOTAL HIP REPLACEMENT AND REVISIONS X 2   OOPHORECTOMY     has a partial of one ovary remaingin   PELVIC LAPAROSCOPY     ovarian cyst removal,    REVISION TOTAL HIP ARTHROPLASTY     left   right Achilles Tendon repair  5/13   TOTAL KNEE ARTHROPLASTY     right   TOTAL KNEE ARTHROPLASTY     left   VAGINAL HYSTERECTOMY     with ovarian cyst removal     reports that she quit smoking about 59 years ago. Her smoking use included cigarettes. She has never used smokeless tobacco. She reports that she does not  currently use drugs. She reports that she does not drink alcohol.  Allergies  Allergen Reactions   Cephalexin Other (See Comments)    Pt states this caused eye problems and STROKE-LIKE SYMPTOMS!!   Shellfish-Derived Products Hives and Rash   Clindamycin/Lincomycin Rash   Lincomycin Rash   Clindamycin Hcl Other (See Comments)    "Per pharmacist"   Fosfomycin Other (See Comments)    Disorientation   Levofloxacin Other (See Comments)    Reaction not recalled by the patient   Penicillamine Other (See Comments)    Reaction not recalled by the patient   Sugar-Protein-Starch Other (See Comments)    Sugary foods cause legs to cramp   Tape Other (See Comments)    Please use paper tape or Coban Wrap; patient's skin tears VERY easily!!   Ciprofloxacin Rash   Sulfa Antibiotics Hives and Rash   Sulfites Hives, Swelling and Rash    Family History  Problem Relation Age of Onset   Hypertension Mother    Stroke Mother    Heart attack Father 3   Heart failure Father    Heart failure Sister    Aortic aneurysm Sister    Aortic aneurysm Sister    Stroke Sister    Pneumonia Brother    Stroke Brother    ALS Other    Hyperlipidemia Daughter    Aortic aneurysm Son    Breast cancer Neg Hx     Prior to Admission medications   Medication Sig Start Date End Date Taking? Authorizing Provider  allopurinol (ZYLOPRIM) 100 MG tablet Take 50 mg by mouth in the morning.    [provider]  amoxicillin (AMOXIL) 500 MG capsule Take 2,000 mg by mouth See admin instructions. Take 2,000 mg by mouth one hour prior to dental procedures    [provider]  aspirin 81 MG tablet Take 81 mg by mouth at bedtime.    [provider]  B Complex Vitamins (VITAMIN B COMPLEX) TABS Take 1 tablet by mouth daily with breakfast.    [provider]  betamethasone dipropionate 0.05 % cream Apply topically 2 (two) times daily. Applied to the right ear canal twice daily for 4 to 5 days or  until itching resolves. Patient not taking: Reported on 04/07/2022 12/22/20   Rozetta Nunnery, MD  carvedilol (COREG) 12.5 MG tablet Take 1 tablet (12.5 mg total) by mouth 2 (two) times daily with a meal. 04/12/22 07/11/22  Pokhrel, Corrie Mckusick, MD  Cholecalciferol (VITAMIN D3) 25 MCG (1000 UT) CAPS Take 1,000 Units by mouth daily.    [provider]  diltiazem (CARDIZEM CD) 240 MG 24 hr capsule Take 1 capsule (240  mg total) by mouth daily. 10/27/21   Belva Crome, MD  FEROSUL 325 (65 Fe) MG tablet Take 650 mg by mouth daily with breakfast.    [provider]  furosemide (LASIX) 20 MG tablet Take 1 tablet (20 mg total) by mouth every other day. 04/11/22 04/11/23  Pokhrel, Corrie Mckusick, MD  losartan (COZAAR) 25 MG tablet Take 1 tablet (25 mg total) by mouth daily. 04/11/22 07/10/22  Pokhrel, Laxman, MD  MITIGARE 0.6 MG CAPS Take 0.6 mg by mouth in the morning.    [provider]  Multiple Vitamins-Minerals (SYSTANE ICAPS AREDS2 PO) Take 1 capsule by mouth in the morning and at bedtime.    [provider]  NONFORMULARY OR COMPOUNDED ITEM Apply 1 application  topically See admin instructions. Triamcinolone 0.1% cream/Cetaphil compounded cream- Apply to the back, legs, and arms 2 times a day as directed    [provider]  potassium chloride (KLOR-CON M) 10 MEQ tablet Take 1 tablet (10 mEq total) by mouth daily for 15 days. 04/11/22 04/26/22  Pokhrel, Corrie Mckusick, MD  sodium chloride 1 g tablet Take 1 g by mouth daily. 08/21/19   [provider]  TYLENOL 500 MG tablet Take 500-1,000 mg by mouth every 6 (six) hours as needed for mild pain or headache.    [provider]    Physical Exam: Vitals:   06/12/22 2045 06/12/22 2115 06/12/22 2130 06/12/22 2145  BP: (!) 144/68 (!) 159/77 (!) 153/78 (!) 167/80  Pulse: 77 75 78 78  Resp: 20 19 20  (!) 25  Temp:      TempSrc:      SpO2: 100% 100% 100% 100%  Weight:      Height:        Constitutional: NAD, calm,  comfortable Vitals:   06/12/22 2045 06/12/22 2115 06/12/22 2130 06/12/22 2145  BP: (!) 144/68 (!) 159/77 (!) 153/78 (!) 167/80  Pulse: 77 75 78 78  Resp: 20 19 20  (!) 25  Temp:      TempSrc:      SpO2: 100% 100% 100% 100%  Weight:      Height:       Eyes: PERRL, lids and conjunctivae normal ENMT: Mucous membranes are moist. Posterior pharynx clear of any exudate or lesions.Normal dentition.  Neck: normal, supple, no masses, no thyromegaly Respiratory: clear to auscultation bilaterally, no wheezing, no crackles. Normal respiratory effort. No accessory muscle use.  Cardiovascular: Regular rate and rhythm, no murmurs / rubs / gallops. No extremity edema. 2+ pedal pulses. No carotid bruits.  Abdomen: no tenderness, no masses palpated. No hepatosplenomegaly. Bowel sounds positive.  Musculoskeletal: no clubbing / cyanosis. No joint deformity upper and lower extremities. Good ROM, no contractures. Normal muscle tone.  Skin: no rashes, lesions, ulcers. No induration Neurologic: CN 2-12 grossly intact. Sensation intact, DTR normal. Strength 5/5 in all 4.  Psychiatric: Normal judgment and insight. Alert and oriented x 3. Normal mood.    Labs on Admission: I have personally reviewed following labs and imaging studies  CBC: Recent Labs  Lab 06/12/22 1556  WBC 6.3  NEUTROABS 4.1  HGB 12.9  HCT 40.9  MCV 84.3  PLT 0000000   Basic Metabolic Panel: Recent Labs  Lab 06/12/22 1556  NA 130*  K 4.7  CL 95*  CO2 23  GLUCOSE 107*  BUN 36*  CREATININE 1.19*  CALCIUM 9.1  MG 2.2   GFR: Estimated Creatinine Clearance: 26 mL/min (A) (by C-G formula based on SCr of 1.19 mg/dL (  H)). Liver Function Tests: Recent Labs  Lab 06/12/22 1556  AST 30  ALT 19  ALKPHOS 76  BILITOT 0.3  PROT 7.3  ALBUMIN 3.0*   No results for input(s): "LIPASE", "AMYLASE" in the last 168 hours. No results for input(s): "AMMONIA" in the last 168 hours. Coagulation Profile: No results for input(s): "INR",  "PROTIME" in the last 168 hours. Cardiac Enzymes: No results for input(s): "CKTOTAL", "CKMB", "CKMBINDEX", "TROPONINI" in the last 168 hours. BNP (last 3 results) No results for input(s): "PROBNP" in the last 8760 hours. HbA1C: No results for input(s): "HGBA1C" in the last 72 hours. CBG: No results for input(s): "GLUCAP" in the last 168 hours. Lipid Profile: No results for input(s): "CHOL", "HDL", "LDLCALC", "TRIG", "CHOLHDL", "LDLDIRECT" in the last 72 hours. Thyroid Function Tests: No results for input(s): "TSH", "T4TOTAL", "FREET4", "T3FREE", "THYROIDAB" in the last 72 hours. Anemia Panel: No results for input(s): "VITAMINB12", "FOLATE", "FERRITIN", "TIBC", "IRON", "RETICCTPCT" in the last 72 hours. Urine analysis:    Component Value Date/Time   COLORURINE YELLOW 04/07/2022 1348   APPEARANCEUR HAZY (A) 04/07/2022 1348   LABSPEC 1.015 04/07/2022 1348   PHURINE 5.0 04/07/2022 1348   GLUCOSEU NEGATIVE 04/07/2022 1348   HGBUR SMALL (A) 04/07/2022 1348   BILIRUBINUR NEGATIVE 04/07/2022 1348   KETONESUR NEGATIVE 04/07/2022 1348   PROTEINUR 100 (A) 04/07/2022 1348   UROBILINOGEN 0.2 04/17/2021 1314   NITRITE NEGATIVE 04/07/2022 1348   LEUKOCYTESUR NEGATIVE 04/07/2022 1348    Radiological Exams on Admission: DG Chest Portable 1 View  Result Date: 06/12/2022 CLINICAL DATA:  Short of breath. EXAM: PORTABLE CHEST 1 VIEW COMPARISON:  05/12/2018 for FINDINGS: Stable cardiomediastinal contours. No pleural fluid or interstitial edema. Signs of chronic lung disease is again noted with mild diffuse increased reticular interstitial markings. Asymmetric increased opacification within the periphery of the right upper lobe and the left midlung noted suspicious for superimposed pneumonia. Visualized osseous structures appear intact. IMPRESSION: 1. Asymmetric increased opacification within the periphery of the right upper lobe and left midlung suspicious for superimposed pneumonia. 2. Chronic lung  disease. Electronically Signed   By: Kerby Moors M.D.   On: 06/12/2022 16:58    EKG: Independently reviewed. See above   Assessment/Plan  CAP with acute hypoxic respiratory failure  -patient with sob /cough Opacities on cxr -continue  iv abx  -pulmonary toilet  -urine ag, sputum, f/u on culture data   -respiratory panel pending  -ct chest pending   CHFpef  -appears euvolemic  -bnp less than prior appears at dry baseline  - s/p lasix iv in ed  -resume oral home diuretic , prn iv dosing as needed  -strict I/o daily weight  -resume carvedilol /losartan once med rec completed   Essential HTN, uncontrolled  -resume cardizem , carvedilol, losartan   CKDIIIa -at baseline  -continue to monitor   Thoracic aneurysm -no active issues    Chronic hyponatremia  -stable NA  base 130  DVT prophylaxis: heparin Code Status: DNR Family Communication:  Disposition Plan: patient  expected to be admitted greater than 2 midnights  Consults called: n/a Admission status: cardiac tele    Clance Boll MD [facility]   If 7PM-7AM, please contact night-coverage [email or web address] Password [email or web address]  06/12/2022, 10:09 PM

## 2022-06-12 NOTE — ED Notes (Signed)
Patient has ambulating pulse ox ordered, she did not perform well, and was extraordinarily unsteady and weak which she said she never is. The provider was made aware and the decision will be admitted. Patient insisted on going to bathroom over a bed pain or bedside commode. So she was assisted in a wheel chair.

## 2022-06-12 NOTE — ED Triage Notes (Signed)
Pt BIB[facility]EM[facility] for SOB. Started last night worsen today. Wednesday supposed to be on oxygen and never got delivered and has not been on any since. Dyspnea on exertion. Pt speaking in full sentences with no apparent distress.   Hx CHF  Rales in all quandrants  154/72 2LPM - 84% RA initially   20 LAC

## 2022-06-12 NOTE — ED Notes (Signed)
Patient refused lab at this time. Said she is done with all of this and is ready to go. Wants to speak to the doctor. They will be made aware.

## 2022-06-12 NOTE — ED Notes (Signed)
Pt keeps saying she does not want to take any of her medications anymore and "go peacefully" and wants to talk to the doctor about her care here. She states "I am 89 I have lived my life."

## 2022-06-12 NOTE — ED Provider Notes (Signed)
[facility] Provider Note   CSN: [id number] Arrival date & time: 06/12/22  1540     History  Chief Complaint  Patient presents with   Shortness of [name] is a 89 y.o. female.  Patient is a 89 year old female with a past medical history of CHF, hypertension, CKD presenting to the emergency department with shortness of breath.  The patient states that she has had increasing shortness of breath over the last few days and it acutely worsened today.  She states that she saw her primary doctor who ordered her home oxygen but she states that she has not yet received it to start it.  She denies any associated chest pain.  She denies any fevers.  She states that she does have a nonproductive cough.  She states that she has mild swelling in her legs.  The history is provided by the patient.  Shortness of Breath      Home Medications Prior to Admission medications   Medication Sig Start Date End Date Taking? Authorizing Provider  allopurinol (ZYLOPRIM) 100 MG tablet Take 50 mg by mouth in the morning.    [provider]  amoxicillin (AMOXIL) 500 MG capsule Take 2,000 mg by mouth See admin instructions. Take 2,000 mg by mouth one hour prior to dental procedures    [provider]  aspirin 81 MG tablet Take 81 mg by mouth at bedtime.    [provider]  B Complex Vitamins (VITAMIN B COMPLEX) TABS Take 1 tablet by mouth daily with breakfast.    [provider]  betamethasone dipropionate 0.05 % cream Apply topically 2 (two) times daily. Applied to the right ear canal twice daily for 4 to 5 days or until itching resolves. Patient not taking: Reported on 04/07/2022 12/22/20   Rozetta Nunnery, MD  carvedilol (COREG) 12.5 MG tablet Take 1 tablet (12.5 mg total) by mouth 2 (two) times daily with a meal. 04/12/22 07/11/22  Pokhrel, Corrie Mckusick, MD  Cholecalciferol (VITAMIN D3) 25 MCG (1000 UT) CAPS Take  1,000 Units by mouth daily.    [provider]  diltiazem (CARDIZEM CD) 240 MG 24 hr capsule Take 1 capsule (240 mg total) by mouth daily. 10/27/21   Belva Crome, MD  FEROSUL 325 (65 Fe) MG tablet Take 650 mg by mouth daily with breakfast.    [provider]  furosemide (LASIX) 20 MG tablet Take 1 tablet (20 mg total) by mouth every other day. 04/11/22 04/11/23  Pokhrel, Corrie Mckusick, MD  losartan (COZAAR) 25 MG tablet Take 1 tablet (25 mg total) by mouth daily. 04/11/22 07/10/22  Pokhrel, Laxman, MD  MITIGARE 0.6 MG CAPS Take 0.6 mg by mouth in the morning.    [provider]  Multiple Vitamins-Minerals (SYSTANE ICAPS AREDS2 PO) Take 1 capsule by mouth in the morning and at bedtime.    [provider]  NONFORMULARY OR COMPOUNDED ITEM Apply 1 application  topically See admin instructions. Triamcinolone 0.1% cream/Cetaphil compounded cream- Apply to the back, legs, and arms 2 times a day as directed    [provider]  potassium chloride (KLOR-CON M) 10 MEQ tablet Take 1 tablet (10 mEq total) by mouth daily for 15 days. 04/11/22 04/26/22  Pokhrel, Corrie Mckusick, MD  sodium chloride 1 g tablet Take 1 g by mouth daily. 08/21/19   [provider]  TYLENOL 500 MG tablet Take 500-1,000 mg by mouth every 6 (six) hours as  needed for mild pain or headache.    [provider]      Allergies    Cephalexin, Shellfish-derived products, Clindamycin/lincomycin, Lincomycin, Clindamycin hcl, Fosfomycin, Levofloxacin, Penicillamine, Sugar-protein-starch, Tape, Ciprofloxacin, Sulfa antibiotics, and Sulfites    Review of Systems   Review of Systems  Respiratory:  Positive for shortness of breath.     Physical Exam Updated Vital Signs BP (!) 153/78   Pulse 78   Temp 98.8 F (37.1 C) (Oral)   Resp 20   Ht 5\' 3"  (1.6 m)   Wt 58.1 kg   SpO2 100%   BMI 22.67 kg/m  Physical Exam Vitals and nursing note reviewed.  Constitutional:      General: She is not in acute  distress.    Appearance: She is well-developed.  HENT:     Head: Normocephalic and atraumatic.     Mouth/Throat:     Mouth: Mucous membranes are moist.     Pharynx: Oropharynx is clear.  Eyes:     Extraocular Movements: Extraocular movements intact.  Neck:     Vascular: JVD (~2-3 cm above clavicle) present.  Cardiovascular:     Rate and Rhythm: Normal rate and regular rhythm.     Pulses: Normal pulses.     Heart sounds: Normal heart sounds.  Pulmonary:     Effort: Pulmonary effort is normal.     Breath sounds: Rhonchi (diffuse) present.  Abdominal:     Palpations: Abdomen is soft.  Musculoskeletal:        General: Normal range of motion.     Cervical back: Normal range of motion and neck supple.     Right lower leg: Edema (1+) present.     Left lower leg: Edema (1+) present.  Skin:    General: Skin is warm and dry.  Neurological:     General: No focal deficit present.     Mental Status: She is alert and oriented to person, place, and time.  Psychiatric:        Mood and Affect: Mood normal.        Behavior: Behavior normal.     ED Results / Procedures / Treatments   Labs (all labs ordered are listed, but only abnormal results are displayed) Labs Reviewed  BRAIN NATRIURETIC PEPTIDE - Abnormal; Notable for the following components:      Result Value   B Natriuretic Peptide 195.3 (*)    All other components within normal limits  COMPREHENSIVE METABOLIC PANEL - Abnormal; Notable for the following components:   Sodium 130 (*)    Chloride 95 (*)    Glucose, Bld 107 (*)    BUN 36 (*)    Creatinine, Ser 1.19 (*)    Albumin 3.0 (*)    GFR, Estimated 43 (*)    All other components within normal limits  CBC WITH DIFFERENTIAL/PLATELET - Abnormal; Notable for the following components:   RDW 15.9 (*)    All other components within normal limits  SARS CORONAVIRUS 2 BY RT PCR  MAGNESIUM  TROPONIN I (HIGH SENSITIVITY)  TROPONIN I (HIGH SENSITIVITY)    EKG EKG  Interpretation  Date/Time:  Saturday June 12 2022 15:49:14 EDT Ventricular Rate:  80 PR Interval:  197 QRS Duration: 79 QT Interval:  354 QTC Calculation: 409 R Axis:   59 Text Interpretation: Sinus rhythm Left atrial enlargement Anterior infarct, old No significant change since last tracing Confirmed by Leanord Asal ([id number]) on 06/12/2022 4:02:07 PM  Radiology DG Chest Portable 1 View  Result Date: 06/12/2022 CLINICAL DATA:  Short of breath. EXAM: PORTABLE CHEST 1 VIEW COMPARISON:  05/12/2018 for FINDINGS: Stable cardiomediastinal contours. No pleural fluid or interstitial edema. Signs of chronic lung disease is again noted with mild diffuse increased reticular interstitial markings. Asymmetric increased opacification within the periphery of the right upper lobe and the left midlung noted suspicious for superimposed pneumonia. Visualized osseous structures appear intact. IMPRESSION: 1. Asymmetric increased opacification within the periphery of the right upper lobe and left midlung suspicious for superimposed pneumonia. 2. Chronic lung disease. Electronically Signed   By: Kerby Moors M.D.   On: 06/12/2022 16:58    Procedures Procedures    Medications Ordered in ED Medications  furosemide (LASIX) injection 20 mg (20 mg Intravenous Given 06/12/22 1925)  doxycycline (VIBRA-TABS) tablet 100 mg (100 mg Oral Given 06/12/22 1925)    ED Course/ Medical Decision Making/ A&P Clinical Course as of 06/12/22 2203  Sat Jun 12, 2022  1847 BNP mildly elevated, CXR concerning for possible pneumonia. She will be given a dose of IV lasix and antibiotics. [VK]  1954 Patient attempted ambulation and de-satted to 87% and became very short of breath and dizzy before making it to the door. She will be admitted for her shortness of breath  [VK]  2201 Patient signed out to hospitalist who additionally recommended CTPE and COVID testing and will admit the patient. [VK]    Clinical Course User Index [VK]  Kemper Durie, DO                             Medical Decision Making This patient presents to the ED with chief complaint(s) of SOB with pertinent past medical history of CHF, HTN, CKD which further complicates the presenting complaint. The complaint involves an extensive differential diagnosis and also carries with it a high risk of complications and morbidity.    The differential diagnosis includes CHF exacerbation, ACS, arrhythmia, anemia, pulmonary edema, pleural effusion, pneumonia, pneumothorax, viral syndrome  Additional history obtained: Additional history obtained from N/A Records reviewed previous admission documents and [place]  ED Course and Reassessment: On patient's arrival to the emergency department she was satting well on room air no acute respiratory distress.  She does have bilateral crackles on exam with some mild JVD but does not appear floridly volume overloaded.  Patient will have labs including BNP, chest x-ray and EKG performed and she will be closely reassessed.  Independent labs interpretation:  The following labs were independently interpreted: Mildly elevated BNP, labs otherwise within normal range  Independent visualization of imaging: - I independently visualized the following imaging with scope of interpretation limited to determining acute life threatening conditions related to emergency care: Chest x-ray, which revealed interstitial edema versus pneumonia  Consultation: - Consulted or discussed management/test interpretation w/ external professional: Hospitalists  Consideration for admission or further workup: Patient requires admission for her shortness of breath and hypoxia Social Determinants of health: N/A    Amount and/or Complexity of Data Reviewed Labs: ordered. Radiology: ordered.  Risk Prescription drug management. Decision regarding hospitalization.          Final Clinical Impression(s) / ED  Diagnoses Final diagnoses:  Acute on chronic congestive heart failure, unspecified heart failure type ([place])  Community acquired pneumonia, unspecified laterality  Hypoxia    Rx / DC Orders ED Discharge Orders     None         Kemper Durie, DO  06/12/22 2203  

## 2022-06-12 NOTE — ED Notes (Signed)
ED TO INPATIENT HANDOFF REPORT  ED Nurse Name and Phone #: Inda Merlin [phone]   S Name/Age/Gender [name] 89 y.o. female Room/Bed: 023C/023C  Code Status   Code Status: DNR  Home/SNF/Other Home Patient oriented to: self, place, time, and situation Is this baseline? Yes   Triage Complete: Triage complete  Chief Complaint CAP (community acquired pneumonia) [J18.9]  Triage Note Pt BIBGEMS for SOB. Started last night worsen today. Wednesday supposed to be on oxygen and never got delivered and has not been on any since. Dyspnea on exertion. Pt speaking in full sentences with no apparent distress.   Hx CHF  Rales in all quandrants  154/72 2LPM - 84% RA initially   20 LAC   Allergies Allergies  Allergen Reactions   Cephalexin Other (See Comments)    Pt states this caused eye problems and STROKE-LIKE SYMPTOMS!!   Shellfish-Derived Products Hives and Rash   Clindamycin/Lincomycin Rash   Lincomycin Rash   Clindamycin Hcl Other (See Comments)    "Per pharmacist"   Fosfomycin Other (See Comments)    Disorientation   Levofloxacin Other (See Comments)    Reaction not recalled by the patient   Penicillamine Other (See Comments)    Reaction not recalled by the patient   Sugar-Protein-Starch Other (See Comments)    Sugary foods cause legs to cramp   Tape Other (See Comments)    Please use paper tape or Coban Wrap; patient's skin tears VERY easily!!   Ciprofloxacin Rash   Sulfa Antibiotics Hives and Rash   Sulfites Hives, Swelling and Rash    Level of Care/Admitting Diagnosis ED Disposition     ED Disposition  Admit   Condition  --   [place]: [facility] [[id number]]  Level of Care: Telemetry Cardiac [[id number]]  May admit patient to [facility] or [facility] if equivalent level of care is available:: No  Covid Evaluation: Symptomatic Person Under Investigation (PUI) or recent exposure (last 10 days) *Testing Required*   Diagnosis: CAP (community acquired pneumonia) [id number]  Admitting Physician: Clance Boll [id number]  Attending Physician: Clance Boll [id number]  Certification:: I certify this patient is being admitted for an inpatient-only procedure  Estimated Length of Stay: 3          B Medical/Surgery History Past Medical History:  Diagnosis Date   Anemia    CHRONIC   Aortic insufficiency    a. 2D echo 08/29/15: EF 55-60%, mild LVH, diastolic dysfunction, elevated LV filling pressure, mild AI, severe LAE, mild RAE, mild TR, dilated descending thoracic aorta just distal to the takeoff of the left subclavian, measuring 4.1 cm, no pericardial effusion.   Arthritis    OA-SOME BACK AND NECK PAIN,  GOES TO CHIROPRACTOR TWICE A MONTH;  HX OF JOINT REPLACEMENTS    Cancer ([facility] [place])    SKIN CANCERS REMOVED FROM LEGS   CKD (chronic kidney disease), stage II    Depression    Essential hypertension    Gait abnormality 09/08/2017   GERD (gastroesophageal reflux disease)    Low sodium levels    Myocarditis ([facility])    PSVT (paroxysmal supraventricular tachycardia)    Recurrent Pericarditis    RECURRENT    Thoracic aneurysm    a. Followed by [facility] -last seen in 12/2013 w/ plan for f/u MRA and thoracic surgery f/u in 2 yrs, but patient does not wish to follow any longer.   Past Surgical History:  Procedure Laterality Date   APPENDECTOMY  BREAST EXCISIONAL BIOPSY Left 1992   benign   EXCISION/RELEASE BURSA HIP Left 11/24/2012   Procedure: LEFT HIP BURSECTOMY AND TENDON REPAIR ;  Surgeon: Gearlean Alf, MD;  Location: [facility];  Service: Orthopedics;  Laterality: Left;   HIP CLOSED REDUCTION Left 12/19/2012   Procedure: CLOSED MANIPULATION HIP;  Surgeon: Mauri Pole, MD;  Location: [facility];  Service: Orthopedics;  Laterality: Left;   HIP CLOSED REDUCTION Left 12/05/2013   Procedure: CLOSED MANIPULATION HIP;  Surgeon: Augustin Schooling, MD;  Location: [facility];  Service: Orthopedics;  Laterality:  Left;   HIP CLOSED REDUCTION Left 09/22/2015   Procedure: CLOSED REDUCTION HIP;  Surgeon: Melina Schools, MD;  Location: [facility];  Service: Orthopedics;  Laterality: Left;   JOINT REPLACEMENT     LEFT TOTAL HIP REPLACEMENT AND REVISIONS X 2   OOPHORECTOMY     has a partial of one ovary remaingin   PELVIC LAPAROSCOPY     ovarian cyst removal,    REVISION TOTAL HIP ARTHROPLASTY     left   right Achilles Tendon repair  5/13   TOTAL KNEE ARTHROPLASTY     right   TOTAL KNEE ARTHROPLASTY     left   VAGINAL HYSTERECTOMY     with ovarian cyst removal     A IV Location/Drains/Wounds Patient Lines/Drains/Airways Status     Active Line/Drains/Airways     Name Placement date Placement time Site Days   Peripheral IV 06/12/22 20 G Anterior;Left;Proximal Forearm 06/12/22  --  Forearm  less than 1            Intake/Output Last 24 hours No intake or output data in the 24 hours ending 06/12/22 2313  Labs/Imaging Results for orders placed or performed during the hospital encounter of 06/12/22 (from the past 48 hour(s))  Magnesium     Status: None   Collection Time: 06/12/22  3:56 PM  Result Value Ref Range   Magnesium 2.2 1.7 - 2.4 mg/dL    Comment: Performed at [facility], [place] [address], [place], [place] [address]  Brain natriuretic peptide (order ONLY if patient c/o SOB)     Status: Abnormal   Collection Time: 06/12/22  3:56 PM  Result Value Ref Range   B Natriuretic Peptide 195.3 (H) 0.0 - 100.0 pg/mL    Comment: Performed at [facility] [address] [place], [place] [address]  Comprehensive metabolic panel     Status: Abnormal   Collection Time: 06/12/22  3:56 PM  Result Value Ref Range   Sodium 130 (L) 135 - 145 mmol/L   Potassium 4.7 3.5 - 5.1 mmol/L   Chloride 95 (L) 98 - 111 mmol/L   CO2 23 22 - 32 mmol/L   Glucose, Bld 107 (H) 70 - 99 mg/dL    Comment: Glucose reference range applies only to samples taken after fasting for at least 8 hours.   BUN 36  (H) 8 - 23 mg/dL   Creatinine, Ser 1.19 (H) 0.44 - 1.00 mg/dL   Calcium 9.1 8.9 - 10.3 mg/dL   Total Protein 7.3 6.5 - 8.1 g/dL   Albumin 3.0 (L) 3.5 - 5.0 g/dL   AST 30 15 - 41 U/L   ALT 19 0 - 44 U/L   Alkaline Phosphatase 76 38 - 126 U/L   Total Bilirubin 0.3 0.3 - 1.2 mg/dL   GFR, Estimated 43 (L) >60 mL/min    Comment: (NOTE) Calculated using the CKD-EPI Creatinine Equation (  2021)    Anion gap 12 5 - 15    Comment: Performed at [facility], [place] [address], [place], [place] [address]  CBC with Differential/Platelet     Status: Abnormal   Collection Time: 06/12/22  3:56 PM  Result Value Ref Range   WBC 6.3 4.0 - 10.5 K/uL   RBC 4.85 3.87 - 5.11 MIL/uL   Hemoglobin 12.9 12.0 - 15.0 g/dL   HCT 40.9 36.0 - 46.0 %   MCV 84.3 80.0 - 100.0 fL   MCH 26.6 26.0 - 34.0 pg   MCHC 31.5 30.0 - 36.0 g/dL   RDW 15.9 (H) 11.5 - 15.5 %   Platelets 293 150 - 400 K/uL   nRBC 0.0 0.0 - 0.2 %   Neutrophils Relative % 64 %   Neutro Abs 4.1 1.7 - 7.7 K/uL   Lymphocytes Relative 12 %   Lymphs Abs 0.8 0.7 - 4.0 K/uL   Monocytes Relative 16 %   Monocytes Absolute 1.0 0.1 - 1.0 K/uL   Eosinophils Relative 6 %   Eosinophils Absolute 0.4 0.0 - 0.5 K/uL   Basophils Relative 1 %   Basophils Absolute 0.1 0.0 - 0.1 K/uL   Immature Granulocytes 1 %   Abs Immature Granulocytes 0.06 0.00 - 0.07 K/uL    Comment: Performed at [facility] [address], [place], [place] [address]  Troponin I (High Sensitivity)     Status: None   Collection Time: 06/12/22  3:56 PM  Result Value Ref Range   Troponin I (High Sensitivity) 11 <18 ng/L    Comment: (NOTE) Elevated high sensitivity troponin I (hsTnI) values and significant  changes across serial measurements may suggest ACS but many other  chronic and acute conditions are known to elevate hsTnI results.  Refer to the "Links" section for chest pain algorithms and additional  guidance. Performed at [facility], [place] [address]  [place], [place] [address]   Troponin I (High Sensitivity)     Status: None   Collection Time: 06/12/22  6:43 PM  Result Value Ref Range   Troponin I (High Sensitivity) 8 <18 ng/L    Comment: (NOTE) Elevated high sensitivity troponin I (hsTnI) values and significant  changes across serial measurements may suggest ACS but many other  chronic and acute conditions are known to elevate hsTnI results.  Refer to the "Links" section for chest pain algorithms and additional  guidance. Performed at [facility], [place] [address] [place], [place] [address]    DG Chest Portable 1 View  Result Date: 06/12/2022 CLINICAL DATA:  Short of breath. EXAM: PORTABLE CHEST 1 VIEW COMPARISON:  05/12/2018 for FINDINGS: Stable cardiomediastinal contours. No pleural fluid or interstitial edema. Signs of chronic lung disease is again noted with mild diffuse increased reticular interstitial markings. Asymmetric increased opacification within the periphery of the right upper lobe and the left midlung noted suspicious for superimposed pneumonia. Visualized osseous structures appear intact. IMPRESSION: 1. Asymmetric increased opacification within the periphery of the right upper lobe and left midlung suspicious for superimposed pneumonia. 2. Chronic lung disease. Electronically Signed   By: Kerby Moors M.D.   On: 06/12/2022 16:58    Pending Labs Unresulted Labs (From admission, onward)     Start     Ordered   06/13/22 0500  Comprehensive metabolic panel  Tomorrow morning,   R        06/12/22 2257   06/13/22 0500  CBC  Tomorrow  morning,   R        06/12/22 2257   06/12/22 2257  Procalcitonin  Once,   R       References:    Procalcitonin Lower Respiratory Tract Infection AND Sepsis Procalcitonin Algorithm   06/12/22 2257   06/12/22 2257  C-reactive protein  Once,   R        06/12/22 2257   06/12/22 2257  Lactic acid, plasma  STAT Now then every 3 hours,   R (with STAT occurrences)      06/12/22 2257   06/12/22  2257  TSH  Once,   R        06/12/22 2257   06/12/22 2257  Expectorated Sputum Assessment w Gram Stain, Rflx to Resp Cult  Once,   R        06/12/22 2257   06/12/22 2257  Hemoglobin A1c  Once,   R        06/12/22 2257   06/12/22 2255  CBC  (heparin)  Once,   R       Comments: Baseline for heparin therapy IF NOT ALREADY DRAWN.  Notify MD if PLT < 100 K.    06/12/22 2257   06/12/22 2255  Creatinine, serum  (heparin)  Once,   R       Comments: Baseline for heparin therapy IF NOT ALREADY DRAWN.    06/12/22 2257   06/12/22 2254  Culture, blood (routine x 2) Call MD if unable to obtain prior to antibiotics being given  (COPD / Pneumonia / Cellulitis / Lower Extremity Wound)  BLOOD CULTURE X 2,   R (with TIMED occurrences)     Comments: If blood cultures drawn in Emergency Department - Do not draw and cancel order    06/12/22 2257   06/12/22 2254  Expectorated Sputum Assessment w Gram Stain, Rflx to Resp Cult  (COPD / Pneumonia / Cellulitis / Lower Extremity Wound)  Once,   R        06/12/22 2257   06/12/22 2254  Legionella Pneumophila Serogp 1 Ur Ag  (COPD / Pneumonia / Cellulitis / Lower Extremity Wound)  Once,   R        06/12/22 2257   06/12/22 2254  Strep pneumoniae urinary antigen  (COPD / Pneumonia / Cellulitis / Lower Extremity Wound)  Once,   R        06/12/22 2257   06/12/22 2202  SARS Coronavirus 2 by RT PCR (hospital order, performed in [facility] hospital lab) *cepheid single result test* Anterior Nasal Swab  (SARS Coronavirus 2 by RT PCR (hospital order, performed in [facility] hospital lab) *cepheid single result test*)  Once,   URGENT        06/12/22 2201            Vitals/Pain Today's Vitals   06/12/22 2115 06/12/22 2130 06/12/22 2145 06/12/22 2230  BP: (!) 159/77 (!) 153/78 (!) 167/80 (!) 165/77  Pulse: 75 78 78 76  Resp: 19 20 (!) 25 (!) 22  Temp:      TempSrc:      SpO2: 100% 100% 100% 100%  Weight:      Height:      PainSc:        Isolation  Precautions Airborne and Contact precautions  Medications Medications  heparin injection 5,000 Units (5,000 Units Subcutaneous Not Given 06/12/22 2311)  acetaminophen (TYLENOL) tablet 650 mg (has no administration in time range)    Or  acetaminophen (TYLENOL) suppository 650 mg (has no administration in time range)  ondansetron (ZOFRAN) tablet 4 mg (has no administration in time range)    Or  ondansetron (ZOFRAN) injection 4 mg (has no administration in time range)  albuterol (PROVENTIL) (2.5 MG/3ML) 0.083% nebulizer solution 2.5 mg (has no administration in time range)  furosemide (LASIX) injection 20 mg (20 mg Intravenous Given 06/12/22 1925)  doxycycline (VIBRA-TABS) tablet 100 mg (100 mg Oral Given 06/12/22 1925)    Mobility walks with device     Focused Assessments Cardiac Assessment Handoff:  Cardiac Rhythm: Normal sinus rhythm Lab Results  Component Value Date   CKTOTAL 46 01/14/2008   CKMB 1.5 01/14/2008   TROPONINI 0.01        NO INDICATION OF MYOCARDIAL INJURY. 01/14/2008   Lab Results  Component Value Date   DDIMER 1.14 (H) 03/20/2011   Does the Patient currently have chest pain?  No   R Recommendations: See Admitting Provider Note  Report given to:   Additional Notes: Patient states she "has lived enough" and does not want to continue with care. Provider made aware to talk with her. She is very sweet and kind and is fully alert and oriented. She has a slew of labs that I tried to draw but she refused at this time. Also refused the heparin shot.

## 2022-06-12 NOTE — ED Notes (Signed)
Patient provided with meal and drink, okay per MD.

## 2022-06-13 ENCOUNTER — Inpatient Hospital Stay (HOSPITAL_COMMUNITY): Payer: Medicare Other

## 2022-06-13 DIAGNOSIS — Z515 Encounter for palliative care: Secondary | ICD-10-CM

## 2022-06-13 DIAGNOSIS — Z7189 Other specified counseling: Secondary | ICD-10-CM | POA: Diagnosis not present

## 2022-06-13 DIAGNOSIS — J189 Pneumonia, unspecified organism: Secondary | ICD-10-CM | POA: Diagnosis not present

## 2022-06-13 DIAGNOSIS — J13 Pneumonia due to Streptococcus pneumoniae: Secondary | ICD-10-CM

## 2022-06-13 MED ORDER — DIPHENHYDRAMINE HCL 25 MG PO CAPS
25.0000 mg | ORAL_CAPSULE | ORAL | Status: DC | PRN
  Administered 2022-06-13 (×2): 25 mg via ORAL
  Filled 2022-06-13 (×2): qty 1

## 2022-06-13 MED ORDER — AMOXICILLIN-POT CLAVULANATE 875-125 MG PO TABS: 1.0000 | ORAL_TABLET | Freq: Two times a day (BID) | ORAL | Status: DC

## 2022-06-13 MED ORDER — TRIAMCINOLONE ACETONIDE 0.1 % EX CREA
TOPICAL_CREAM | Freq: Three times a day (TID) | CUTANEOUS | Status: DC
  Administered 2022-06-13: 1 via TOPICAL
  Filled 2022-06-13: qty 15

## 2022-06-13 MED ORDER — AMOXICILLIN-POT CLAVULANATE 500-125 MG PO TABS
1.0000 | ORAL_TABLET | Freq: Two times a day (BID) | ORAL | Status: DC
  Administered 2022-06-13 – 2022-06-14 (×2): 1 via ORAL
  Filled 2022-06-13 (×3): qty 1

## 2022-06-13 NOTE — Progress Notes (Signed)
PROGRESS NOTE    [name]  [id number] DOB: November 29, 1929 DOA: 06/12/2022 PCP: Kathalene Frames, MD    Brief Narrative:  89 year old female with history of recurrent pericarditis, hypertension, anemia, CKD, aortic insufficiency, chronic hyponatremia who is recently suffering from progressive shortness of breath, she does have diagnosis of interstitial lung disease.  She went to PCP 2 days ago and was prescribed oxygen but she has not had a chance to get it yet.  She was brought to the emergency room with 84% on room air, resolved with 2 L of oxygen.  Chest x-ray shows chronic lung disease, possible right upper lobe pneumonia. Patient does not want hospitalization, she does not want medications.  She was not safe to discharge so admitted to the hospital.   Assessment & Plan:   Community-acquired pneumonia with acute hypoxemic respiratory failure, patient with history of interstitial lung disease: Antibiotics to treat bacterial pneumonia, patient declined IV antibiotics.  She is agreeable to start Augmentin.  Will treat with 5 days of therapy. Chest physiotherapy, incentive spirometry, deep breathing exercises, sputum induction, mucolytic's. Patient declined further investigations. Supplemental oxygen to keep saturations more than 90%. See goal of care discussion below.  Chronic congestive heart failure: Euvolemic.  Coreg, losartan. Blood pressures are well-controlled on Cardizem, carvedilol and losartan. Kidney functions are at about baseline.  She has CKD stage IIIa. Sodium is at about baseline.  Goal of care: Patient insist on not wanting to be hospitalized and not wanting to do blood work as well as more testing.  She is interested in going back to her independent living place and peacefully dying.  Patient is alert oriented, very clear in her conversation and she had communicated this with her family.  Patient lives alone. Multiple care conference with patient, patient's  son on the phone, case management and hospice provider. Patient agreed to take oral antibiotics today. Patient agreed to get CT scan of the chest as requested by her son to evaluate for progression of chronic lung disease.  Will order high-resolution CT scan. Patient has clear documentation with MOST form with her that states no antibiotics and comfort care. Patient is not actively dying now, however she does have progressive debility and lung disease.  After careful discussion and shared decision making patient will be going home with home hospice care by [facility] after arrangements are made at home and children are available to help her at home.   DVT prophylaxis: heparin injection 5,000 Units Start: 06/12/22 2300   Code Status: DNR/DNI Family Communication: Son on the phone Disposition Plan: Status is: Inpatient Remains inpatient appropriate because: Unsafe discharge     Consultants:  Palliative care  Procedures:  None  Antimicrobials:  Augmentin 3/31   Subjective: Patient seen and examined in the morning rounds.  "Dr., I hope you are clear about my wishes, I do not need to stay in the hospital, I do not need to be started on any new medications.  Arrange oxygen and I will go home".  After discussion and multiple conversations he agreed to arrange home hospice before discharge.  This will happen tomorrow morning.  Objective: Vitals:   06/13/22 0000 06/13/22 0054 06/13/22 0413 06/13/22 0738  BP: (!) 167/82  (!) 167/91 (!) 142/67  Pulse: 78 76  90  Resp: 18  18 20   Temp:  97.7 F (36.5 C) (!) 97.4 F (36.3 C) 98.1 F (36.7 C)  TempSrc:  Oral Oral Oral  SpO2: 100%  100% 99%  Weight:  58.9 kg 58.9 kg   Height:        Intake/Output Summary (Last 24 hours) at 06/13/2022 1349 Last data filed at 06/13/2022 1300 Gross per 24 hour  Intake 537 ml  Output 500 ml  Net 37 ml   Filed Weights   06/12/22 1546 06/13/22 0054 06/13/22 0413  Weight: 58.1 kg 58.9 kg 58.9 kg     Examination:  General exam: Appears calm and comfortable  Alert awake and oriented.  Not in any distress.  On 2 L oxygen. Respiratory system: Fine crackles at bases.  Not in any distress.  On minimal oxygen. Cardiovascular system: S1 & S2 heard, RRR.  Gastrointestinal system: Soft.  Nontender.   Central nervous system: Alert and oriented. No focal neurological deficits. Extremities: Symmetric 5 x 5 power.   Data Reviewed: I have personally reviewed following labs and imaging studies  CBC: Recent Labs  Lab 06/12/22 1556  WBC 6.3  NEUTROABS 4.1  HGB 12.9  HCT 40.9  MCV 84.3  PLT 0000000   Basic Metabolic Panel: Recent Labs  Lab 06/12/22 1556  NA 130*  K 4.7  CL 95*  CO2 23  GLUCOSE 107*  BUN 36*  CREATININE 1.19*  CALCIUM 9.1  MG 2.2   GFR: Estimated Creatinine Clearance: 26 mL/min (A) (by C-G formula based on SCr of 1.19 mg/dL (H)). Liver Function Tests: Recent Labs  Lab 06/12/22 1556  AST 30  ALT 19  ALKPHOS 76  BILITOT 0.3  PROT 7.3  ALBUMIN 3.0*   No results for input(s): "LIPASE", "AMYLASE" in the last 168 hours. No results for input(s): "AMMONIA" in the last 168 hours. Coagulation Profile: No results for input(s): "INR", "PROTIME" in the last 168 hours. Cardiac Enzymes: No results for input(s): "CKTOTAL", "CKMB", "CKMBINDEX", "TROPONINI" in the last 168 hours. BNP (last 3 results) No results for input(s): "PROBNP" in the last 8760 hours. HbA1C: No results for input(s): "HGBA1C" in the last 72 hours. CBG: No results for input(s): "GLUCAP" in the last 168 hours. Lipid Profile: No results for input(s): "CHOL", "HDL", "LDLCALC", "TRIG", "CHOLHDL", "LDLDIRECT" in the last 72 hours. Thyroid Function Tests: No results for input(s): "TSH", "T4TOTAL", "FREET4", "T3FREE", "THYROIDAB" in the last 72 hours. Anemia Panel: No results for input(s): "VITAMINB12", "FOLATE", "FERRITIN", "TIBC", "IRON", "RETICCTPCT" in the last 72 hours. Sepsis Labs: No  results for input(s): "PROCALCITON", "LATICACIDVEN" in the last 168 hours.  Recent Results (from the past 240 hour(s))  SARS Coronavirus 2 by RT PCR (hospital order, performed in [facility] hospital lab) *cepheid single result test* Anterior Nasal Swab     Status: None   Collection Time: 06/12/22 10:56 PM   Specimen: Anterior Nasal Swab  Result Value Ref Range Status   SARS Coronavirus 2 by RT PCR NEGATIVE NEGATIVE Final    Comment: Performed at [facility], [place] [address], [place], [place] [address]         Radiology Studies: DG Chest Portable 1 View  Result Date: 06/12/2022 CLINICAL DATA:  Short of breath. EXAM: PORTABLE CHEST 1 VIEW COMPARISON:  05/12/2018 for FINDINGS: Stable cardiomediastinal contours. No pleural fluid or interstitial edema. Signs of chronic lung disease is again noted with mild diffuse increased reticular interstitial markings. Asymmetric increased opacification within the periphery of the right upper lobe and the left midlung noted suspicious for superimposed pneumonia. Visualized osseous structures appear intact. IMPRESSION: 1. Asymmetric increased opacification within the periphery of the right upper lobe and left midlung suspicious for superimposed  pneumonia. 2. Chronic lung disease. Electronically Signed   By: Kerby Moors M.D.   On: 06/12/2022 16:58        Scheduled Meds:  amoxicillin-clavulanate  1 tablet Oral BID   heparin  5,000 Units Subcutaneous Q8H   triamcinolone cream   Topical TID   Continuous Infusions:   LOS: 1 day    Time spent: 50 minutes    Barb Merino, MD [facility] Pager [phone]

## 2022-06-13 NOTE — Plan of Care (Signed)

## 2022-06-13 NOTE — Consult Note (Signed)
Consultation Note Date: 06/13/2022   Patient Name: [name]  DOB: Mar 26, 1929  MRN: [id number]  Age / Sex: 89 y.o., female  PCP: Kathalene Frames, MD Referring Physician: Barb Merino, MD  Reason for Consultation: Establishing goals of care  HPI/Patient Profile: 88 y.o. female  with past medical history of recurrent pericarditis, hypertension, thoracic aneurysm, anemia, CKD 3a, aortic insufficiency with preserved EF, chronic hyponatremia admitted on 06/12/2022 with shortness of breath with pneumonia.   Clinical Assessment and Goals of Care: Consult received and chart review completed. Reviewed previous palliative care consultation in January and completed MOST form with desire for comfort measures. I spoke with [name] about her goals of care. She is very clear "I am 89 years old and I am ready to go." She is clear in her desire for quality of life but tells me that she does not desire any measures to prolong her life. We reviewed her wishes and telemetry discontinued at her request. She agrees to complete CT scan to know if there is anything that we can provide to add to her quality of life and relief of symptoms. After full discussion she agrees with pill antibiotics with the hopes this will improve her shortness of breath and help her feel better. She does not wish to return to the hospital. I discussed with [name] and [facility] hospice liaison, [name], at bedside. [name] has already received referral for hospice at home and [name] very much agrees with hospice supporting her in her home. [name] says that her husband had hospice care at home ~4 years ago so she knows what they do and believes that they can help her. I left her to speak further with [name] about help at home.   All questions/concerns addressed. Emotional support provided.   Primary Decision Maker PATIENT    SUMMARY OF  RECOMMENDATIONS   - DNR and MOST already completed and no changes - Return home with oxygen and hospice support  Code Status/Advance Care Planning: DNR   Symptom Management:  Per attending.   Prognosis:  < 6 months  Discharge Planning: Home with Hospice      Primary Diagnoses: Present on Admission:  CAP (community acquired pneumonia)   I have reviewed the medical record, interviewed the patient and family, and examined the patient. The following aspects are pertinent.  Past Medical History:  Diagnosis Date   Anemia    CHRONIC   Aortic insufficiency    a. 2D echo 08/29/15: EF 55-60%, mild LVH, diastolic dysfunction, elevated LV filling pressure, mild AI, severe LAE, mild RAE, mild TR, dilated descending thoracic aorta just distal to the takeoff of the left subclavian, measuring 4.1 cm, no pericardial effusion.   Arthritis    OA-SOME BACK AND NECK PAIN,  GOES TO CHIROPRACTOR TWICE A MONTH;  HX OF JOINT REPLACEMENTS    Cancer ([place])    SKIN CANCERS REMOVED FROM LEGS   CKD (chronic kidney disease), stage II    Depression    Essential hypertension  Gait abnormality 09/08/2017   GERD (gastroesophageal reflux disease)    Low sodium levels    Myocarditis (HCC)    PSVT (paroxysmal supraventricular tachycardia)    Recurrent Pericarditis    RECURRENT    Thoracic aneurysm    a. Followed by [facility] -last seen in 12/2013 w/ plan for f/u MRA and thoracic surgery f/u in 2 yrs, but patient does not wish to follow any longer.   Social History   Socioeconomic History   Marital status: Widowed    Spouse name: Not on file   Number of children: Not on file   Years of education: Not on file   Highest education level: Not on file  Occupational History   Not on file  Tobacco Use   Smoking status: Former    Years: 5    Types: Cigarettes    Quit date: 4    Years since quitting: 59.2   Smokeless tobacco: Never   Tobacco comments:    quit age 24  Vaping Use   Vaping Use: Never  used  Substance and Sexual Activity   Alcohol use: No    Alcohol/week: 0.0 standard drinks of alcohol   Drug use: Not Currently   Sexual activity: Not Currently    Birth control/protection: Surgical    Comment: HYST-1st intercourse 89 yo-Fewer than 5 partners  Other Topics Concern   Not on file  Social History Narrative   Diet: very good      Caffeine: 1 1/2 cup coffee (usually decaf)      Married, if yes what year: Widow, 1954      Do you live in a house, apartment, assisted living, condo, trailer, ect: [place], independent living      Is it one or more stories: 2nd level. elevate      How many persons live in your home? 1      Pets: No      Highest level or education completed: 14      Current/Past profession: Actuary      Exercise:  Yes                Type and how often: aerobics twice daily         Living Will: Yes   DNR: Yes   POA/HPOA: Yes      Functional Status:   Do you have difficulty bathing or dressing yourself? No   Do you have difficulty preparing food or eating? No   Do you have difficulty managing your medications? No   Do you have difficulty managing your finances? No   Do you have difficulty affording your medications? No   Social Determinants of Health   Financial Resource Strain: Not on file  Food Insecurity: No Food Insecurity (04/08/2022)   Hunger Vital Sign    Worried About Running Out of Food in the Last Year: Never true    Ran Out of Food in the Last Year: Never true  Transportation Needs: No Transportation Needs (04/08/2022)   PRAPARE - Hydrologist (Medical): No    Lack of Transportation (Non-Medical): No  Physical Activity: Not on file  Stress: Not on file  Social Connections: Not on file   Family History  Problem Relation Age of Onset   Hypertension Mother    Stroke Mother    Heart attack Father 4   Heart failure Father    Heart failure Sister    Aortic aneurysm Sister  Aortic aneurysm Sister    Stroke Sister    Pneumonia Brother    Stroke Brother    ALS Other    Hyperlipidemia Daughter    Aortic aneurysm Son    Breast cancer Neg Hx    Scheduled Meds:  amoxicillin-clavulanate  1 tablet Oral BID   heparin  5,000 Units Subcutaneous Q8H   triamcinolone cream   Topical TID   Continuous Infusions: PRN Meds:.acetaminophen **OR** acetaminophen, albuterol, diphenhydrAMINE, ondansetron **OR** ondansetron (ZOFRAN) IV Allergies  Allergen Reactions   Cephalexin Other (See Comments)    Pt states this caused eye problems and STROKE-LIKE SYMPTOMS!!   Shellfish-Derived Products Hives and Rash   Clindamycin/Lincomycin Rash   Lincomycin Rash   Clindamycin Hcl Other (See Comments)    "Per pharmacist"   Fosfomycin Other (See Comments)    Disorientation   Levofloxacin Other (See Comments)    Reaction not recalled by the patient   Penicillamine Other (See Comments)    Reaction not recalled by the patient   Sugar-Protein-Starch Other (See Comments)    Sugary foods cause legs to cramp   Tape Other (See Comments)    Please use paper tape or Coban Wrap; patient's skin tears VERY easily!!   Ciprofloxacin Rash   Sulfa Antibiotics Hives and Rash   Sulfites Hives, Swelling and Rash   Review of Systems  Constitutional:  Positive for activity change, appetite change and fatigue.  Respiratory:  Positive for shortness of breath.   Neurological:  Positive for weakness.    Physical Exam Vitals and nursing note reviewed.  Constitutional:      Appearance: Normal appearance.     Comments: Elderly, frail  Cardiovascular:     Rate and Rhythm: Normal rate.  Pulmonary:     Effort: No tachypnea, accessory muscle usage or respiratory distress.     Comments: Short of breath with activity Abdominal:     Palpations: Abdomen is soft.  Neurological:     Mental Status: She is alert.     Vital Signs: BP (!) 142/67 (BP Location: Left Arm)   Pulse 90   Temp 98.1 F (36.7  C) (Oral)   Resp 20   Ht 5\' 3"  (1.6 m)   Wt 58.9 kg   SpO2 99%   BMI 22.99 kg/m  Pain Scale: 0-10   Pain Score: 0-No pain   SpO2: SpO2: 99 % O2 Device:SpO2: 99 % O2 Flow Rate: .O2 Flow Rate (L/min): 3 L/min  IO: Intake/output summary:  Intake/Output Summary (Last 24 hours) at 06/13/2022 1033 Last data filed at 06/13/2022 0800 Gross per 24 hour  Intake 360 ml  Output 500 ml  Net -140 ml    LBM: Last BM Date : 06/12/22 Baseline Weight: Weight: 58.1 kg Most recent weight: Weight: 58.9 kg     Palliative Assessment/Data:     Time In: 1030  Time Total: 75 min  Greater than 50%  of this time was spent counseling and coordinating care related to the above assessment and plan.  Signed by: Vinie Sill, NP Palliative Medicine Team Pager # [phone] (M-F 8a-5p) Team Phone # [phone] (Nights/Weekends)

## 2022-06-13 NOTE — Progress Notes (Signed)
[facility]) Hospital Liaison  Received request from Carles Collet, RN with [facility], for hospice services at home after discharge. Met with Ms. Kinman in room and spoke with son Alveta Heimlich, by phone, to initiate education related to hospice philosophy, services, and team approach to care. Patient/family verbalized understanding of information given. Per discussion, the plan is for discharge home by private vehicle with son, Monday April 1 at 1000 am.  Ms. Plaster has a walker and a wheelchair in the home. O2 setup and an additional small portable tank have been ordered for delivery to the home this afternoon. The address has been verified and is correct in the chart.  Please send completed and signed DNR with patient/family.  Please provide prescriptions to ensure ongoing symptom management and comfort.  [facility] information and contact numbers given to Ms. Boney.  Please call with any hospice related questions or concerns.  Thank you, Margaretmary Eddy, BSN, RN [facility] Liaison [phone]

## 2022-06-13 NOTE — TOC Initial Note (Addendum)
Transition of Care [facility]) - Initial/Assessment Note    Patient Details  Name: [name] MRN: [id number] Date of Birth: Nov 06, 1929  [facility]) CM/SW Contact:    Carles Collet, RN Phone Number: 06/13/2022, 12:14 PM  Clinical Narrative:                  Patient from [place].  Patient interested in home hospice care. MD spoke w son, who would like referral sent to [facility].  Included Margaretmary Eddy, [facility] on chat, she acknowledged referral. [facility] reviewing. Patient will need oxygen set up with [facility] prior to DC home.   Oxygen will be delivered to the home today, son will pick up tank before he comes to the hospital tomorrow to pick her up. He will take her home by car.   [facility] will see at home tomorrow morning. They would like DC by 10am  Expected Discharge Plan: Home w Hospice Care Barriers to Discharge: Continued Medical Work up   Patient Goals and CMS Choice Patient states their goals for this hospitalization and ongoing recovery are:: Back to [facility] with hospice care CMS Medicare.gov Compare Post Acute Care list provided to:: Patient Choice offered to / list presented to : Patient, Adult Children      Expected Discharge Plan and Services   Discharge Planning Services: CM Consult   Living arrangements for the past 2 months: [place]: Hospice and [place] Date [facility] Agency Contacted: 06/13/22 Time [place]: 1214 Representative spoke with at [place]: Earnestine Mealing  Prior Living Arrangements/Services Living arrangements for the past 2 months: [place] Lives with:: Self                   Activities of Daily Living      Permission Sought/Granted                  Emotional Assessment              Admission diagnosis:  Hypoxia [R09.02] CAP (community acquired pneumonia) [J18.9] Community acquired pneumonia,  unspecified laterality [J18.9] Acute on chronic congestive heart failure, unspecified heart failure type [I50.9] Patient Active Problem List   Diagnosis Date Noted   CAP (community acquired pneumonia) [id number]   Acute diastolic heart failure ([place]) 04/07/2022   Advanced care planning/counseling discussion 04/07/2022   Chronic venous stasis dermatitis of both lower extremities 04/07/2022   Malaise and fatigue 07/15/2021   Primary osteoarthritis 04/20/2021   Inflammatory polyarthropathy ([place]) 04/20/2021   Hyperuricemia 04/20/2021   Fatigue 04/20/2021   Chronic kidney disease, stage 3 unspecified ([place]) 04/20/2021   Adjustment disorder with mixed disturbance of emotions and conduct 01/20/2021   Severe recurrent major depression without psychotic features ([place]) 01/19/2021   Pincer nail deformity 12/01/2020   Iron deficiency anemia 03/24/2020   Clavi 02/19/2020   Lumbar radiculopathy 06/17/2019   Pain due to onychomycosis of toenails of both feet 05/14/2019   Mixed incontinence 03/10/2019   Cervical spondylosis 06/07/2018   DDD (degenerative disc disease), cervical 05/20/2018   History of total knee replacement, left 04/13/2018   History of total knee replacement, right 04/13/2018   History of revision of total replacement of left hip joint 04/13/2018   Gait abnormality 09/08/2017   Chronic cystitis 07/05/2017   Dysuria  07/05/2017   Anemia 09/22/2015   h/o pericarditis    Essential hypertension    Aortic insufficiency    Posterior vitreous detachment, bilateral 06/05/2015   Left wrist pain 05/16/2015   Triquetral chip fracture, left, closed, initial encounter 05/16/2015   Primary osteoarthritis of first carpometacarpal joint of right hand 02/11/2015   Primary osteoarthritis of right wrist 02/11/2015   Arthropathy 11/25/2014   Subluxation 11/25/2014   Hip dislocation, left ([place]) 12/05/2013   Trochanteric bursitis of left hip 11/24/2012   Aneurysm of thoracic aorta (HCC)     Nonexudative age-related macular degeneration 04/22/2011   Posterior capsular opacification, right 04/22/2011   Status post intraocular lens implant 04/22/2011   PCP:  Kathalene Frames, MD Pharmacy:   [place], [place] - [address] [place] [address] Phone: [phone] Fax: [phone]     Social Determinants of Health (SDOH) Social History: SDOH Screenings   Food Insecurity: No Food Insecurity (04/08/2022)  Housing: Low Risk  (04/08/2022)  Transportation Needs: No Transportation Needs (04/08/2022)  Utilities: Not At Risk (04/08/2022)  Alcohol Screen: Low Risk  (01/19/2021)  Depression (PHQ2-9): Low Risk  (06/10/2021)  Tobacco Use: Medium Risk (06/12/2022)   SDOH Interventions:     Readmission Risk Interventions     No data to display

## 2022-06-13 NOTE — Progress Notes (Signed)
SATURATION QUALIFICATIONS: (This note is used to comply with regulatory documentation for home oxygen)    Patient Saturations on 2 Liters of oxygen while Ambulating = 90%  Please briefly explain why patient needs home oxygen: Patient desat while standing with 2L [place] with symptoms of shortness of breath and complaints of "passing out". RN assisted patient back to bed.

## 2022-06-14 ENCOUNTER — Other Ambulatory Visit (HOSPITAL_COMMUNITY): Payer: Self-pay

## 2022-06-14 DIAGNOSIS — J849 Interstitial pulmonary disease, unspecified: Secondary | ICD-10-CM | POA: Diagnosis not present

## 2022-06-14 DIAGNOSIS — J13 Pneumonia due to Streptococcus pneumoniae: Secondary | ICD-10-CM | POA: Diagnosis not present

## 2022-06-14 MED ORDER — ALBUTEROL SULFATE (2.5 MG/3ML) 0.083% IN NEBU: 2.5000 mg | INHALATION_SOLUTION | RESPIRATORY_TRACT | 0 refills | Status: DC | PRN

## 2022-06-14 MED ORDER — LORAZEPAM 0.5 MG PO TABS: 0.5000 mg | ORAL_TABLET | ORAL | 0 refills | Status: DC | PRN

## 2022-06-14 MED ORDER — AMOXICILLIN-POT CLAVULANATE 500-125 MG PO TABS: 1.0000 | ORAL_TABLET | Freq: Two times a day (BID) | ORAL | 0 refills | Status: AC

## 2022-06-14 MED ORDER — MORPHINE SULFATE (CONCENTRATE) 20 MG/ML PO SOLN: 10.0000 mg | ORAL | 0 refills | Status: DC | PRN

## 2022-06-14 MED ORDER — TRIAMCINOLONE ACETONIDE 0.1 % EX CREA: TOPICAL_CREAM | Freq: Three times a day (TID) | CUTANEOUS | 0 refills | Status: DC

## 2022-06-14 NOTE — Progress Notes (Signed)
[name] to be D/C'd home with hospice per MD order. Discussed with the patient and son and all questions fully answered. Oxygen has been delivered to patient's house per son, and portable tank brought for transport home. Skin clean and dry, scattered redness and bruising.  IV catheter discontinued intact by Fanny Skates, NT. Site without signs and symptoms of complications. Dressing and pressure applied.  An After Visit Summary was printed and given to the patient.  Patient escorted via [place], and D/C home via private auto.  [name]  06/14/2022

## 2022-06-14 NOTE — TOC Transition Note (Signed)
Transition of Care [facility]) - CM/SW Discharge Note   Patient Details  Name: [name] MRN: [id number] Date of Birth: 1929-03-19  [facility]) CM/SW Contact:  Zenon Mayo, RN Phone Number: 06/14/2022, 10:14 AM   Clinical Narrative:    Patient is for dc home with Hospice today per previous NCM note, son will transport home with oxygen.  NCM notified Misty with [facility], she states they have a RN to go out at 1 pm for visit at patient home.   Final next level of care: Home w Hospice Care Barriers to Discharge: Continued Medical Work up   Patient Goals and CMS Choice CMS Medicare.gov Compare Post Acute Care list provided to:: Patient Choice offered to / list presented to : Patient, Adult Children  Discharge Placement                         Discharge Plan and Services Additional resources added to the After Visit Summary for     Discharge Planning Services: CM Consult                        [facility] Agency: Hospice and [place] Date [facility] Agency Contacted: 06/13/22 Time [place]: 1214 Representative spoke with at [place]: [place] Determinants of Health (National) Interventions SDOH Screenings   Food Insecurity: No Food Insecurity (04/08/2022)  Housing: Low Risk  (04/08/2022)  Transportation Needs: No Transportation Needs (04/08/2022)  Utilities: Not At Risk (04/08/2022)  Alcohol Screen: Low Risk  (01/19/2021)  Depression (PHQ2-9): Low Risk  (06/10/2021)  Tobacco Use: Medium Risk (06/12/2022)     Readmission Risk Interventions     No data to display

## 2022-06-14 NOTE — Discharge Summary (Signed)
Physician Discharge Summary  [name] [id number] DOB: 12-06-1929 DOA: 06/12/2022  PCP: Kathalene Frames, MD  Admit date: 06/12/2022 Discharge date: 06/14/2022  Admitted From: Home Disposition: Home to independent living with home hospice.  Recommendations for Outpatient Follow-up:  As per outpatient hospice provider.  Home Health: N/A Equipment/Devices: Oxygen  Discharge Condition: Fair CODE STATUS: DNR Diet recommendation: Regular diet  Discharge summary: 89 year old female with history of recurrent pericarditis, hypertension, anemia, CKD stage 3a, aortic insufficiency, chronic hyponatremia who is recently suffering from progressive shortness of breath. She does have diagnosis of interstitial lung disease.  She went to PCP 2 days ago and was prescribed oxygen but she has not had a chance to get it yet.  She was brought to the emergency room with 84% on room air, resolved with 2 L of oxygen.  Chest x-ray shows chronic lung disease, possible right upper lobe pneumonia. Patient does not want hospitalization, she does not want medications.  She was not safe to discharge so admitted to the hospital. Currently stabilized.  Going home with home hospice program.    Assessment & plan of care:   Community-acquired pneumonia with acute hypoxemic respiratory failure, patient with history of interstitial lung disease: Fairly stable.  Her symptomatology is likely due to advanced interstitial lung disease.  CT scan confirms advanced interstitial lung disease.  Patient desires no further treatment. Discharging home with Augmentin for 5 days.  Supplemental oxygen to keep saturations more than 90%.  Bronchodilator therapy and hospice support at home.  Chronic congestive heart failure: Euvolemic.  Coreg, losartan. Blood pressures are well-controlled on Cardizem, carvedilol and losartan.  Kidney functions are at about baseline.  She has CKD stage IIIa.  Sodium is at about baseline.   She is on sodium tablets.   Goal of care: Patient insist on not wanting to be hospitalized and not wanting to do blood work as well as more testing.  She is interested in going back to her independent living place and peacefully dying.  After multiple conferences with patient, family and palliative care team.  Patient decided to go home with home hospice support system.   Family will provide personal care.   Patient is not actively dying now, however she does have progressive debility and lung disease. Patient was prescribed a short course of morphine and Ativan for symptom control as needed to be used with hospice supervision.    Discharge Diagnoses:  Principal Problem:   CAP (community acquired pneumonia)    Discharge Instructions  Discharge Instructions     Diet - low sodium heart healthy   Complete by: As directed    Increase activity slowly   Complete by: As directed       Allergies as of 06/14/2022       Reactions   Cephalexin Other (See Comments)   Pt states this caused eye problems and STROKE-LIKE SYMPTOMS!!   Shellfish-derived Products Hives, Rash   Clindamycin/lincomycin Rash   Lincomycin Rash   Clindamycin Hcl Other (See Comments)   "Per pharmacist"   Fosfomycin Other (See Comments)   Disorientation   Levofloxacin Other (See Comments)   Reaction not recalled by the patient   Penicillamine Other (See Comments)   Reaction not recalled by the patient   Sugar-protein-starch Other (See Comments)   Sugary foods cause legs to cramp   Tape Other (See Comments)   Please use paper tape or Coban Wrap; patient's skin tears VERY easily!!   Ciprofloxacin Rash   Sulfa Antibiotics  Hives, Rash   Sulfites Hives, Swelling, Rash        Medication List     STOP taking these medications    betamethasone dipropionate 0.05 % cream       TAKE these medications    albuterol (2.5 MG/3ML) 0.083% nebulizer solution Commonly known as: PROVENTIL Take 3 mLs (2.5 mg total)  by nebulization every 4 (four) hours as needed for wheezing or shortness of breath.   allopurinol 100 MG tablet Commonly known as: ZYLOPRIM Take 50 mg by mouth in the morning.   amoxicillin 500 MG capsule Commonly known as: AMOXIL Take 2,000 mg by mouth See admin instructions. Take 2,000 mg by mouth one hour prior to dental procedures   amoxicillin-clavulanate 500-125 MG tablet Commonly known as: AUGMENTIN Take 1 tablet by mouth 2 (two) times daily for 5 days.   aspirin 81 MG tablet Take 81 mg by mouth at bedtime.   carvedilol 12.5 MG tablet Commonly known as: COREG Take 1 tablet (12.5 mg total) by mouth 2 (two) times daily with a meal.   diltiazem 240 MG 24 hr capsule Commonly known as: CARDIZEM CD Take 1 capsule (240 mg total) by mouth daily.   FeroSul 325 (65 FE) MG tablet Generic drug: ferrous sulfate Take 650 mg by mouth daily with breakfast.   furosemide 20 MG tablet Commonly known as: Lasix Take 1 tablet (20 mg total) by mouth every other day.   LORazepam 0.5 MG tablet Commonly known as: ATIVAN Take 1 tablet (0.5 mg total) by mouth every 4 (four) hours as needed for anxiety. May crush, mix with water and give sublingually if needed.   losartan 25 MG tablet Commonly known as: COZAAR Take 1 tablet (25 mg total) by mouth daily.   Mitigare 0.6 MG Caps Generic drug: Colchicine Take 0.6 mg by mouth in the morning.   morphine 20 MG/ML concentrated solution Commonly known as: ROXANOL Take 0.5 mLs (10 mg total) by mouth every 4 (four) hours as needed for severe pain, shortness of breath or anxiety. May give sublingually if needed.   NONFORMULARY OR COMPOUNDED ITEM Apply 1 application  topically See admin instructions. Triamcinolone 0.1% cream/Cetaphil compounded cream- Apply to the back, legs, and arms 2 times a day as directed   potassium chloride 10 MEQ tablet Commonly known as: KLOR-CON M Take 1 tablet (10 mEq total) by mouth daily for 15 days.   sodium  chloride 1 g tablet Take 1 g by mouth daily.   SYSTANE ICAPS AREDS2 PO Take 1 capsule by mouth in the morning and at bedtime.   triamcinolone cream 0.1 % Commonly known as: KENALOG Apply topically 3 (three) times daily.   TYLENOL 500 MG tablet Generic drug: acetaminophen Take 500-1,000 mg by mouth every 6 (six) hours as needed for mild pain or headache.   Vitamin B Complex Tabs Take 1 tablet by mouth daily with breakfast.   Vitamin D3 25 MCG (1000 UT) capsule Take 1,000 Units by mouth daily.        Follow-up Information     Kathalene Frames, MD Follow up.   Specialty: Internal Medicine Why: Please follow up in a week. Contact information: [address], [address] [place] [address] [phone]                Allergies  Allergen Reactions   Cephalexin Other (See Comments)    Pt states this caused eye problems and STROKE-LIKE SYMPTOMS!!   Shellfish-Derived Products Hives and Rash  Clindamycin/Lincomycin Rash   Lincomycin Rash   Clindamycin Hcl Other (See Comments)    "Per pharmacist"   Fosfomycin Other (See Comments)    Disorientation   Levofloxacin Other (See Comments)    Reaction not recalled by the patient   Penicillamine Other (See Comments)    Reaction not recalled by the patient   Sugar-Protein-Starch Other (See Comments)    Sugary foods cause legs to cramp   Tape Other (See Comments)    Please use paper tape or Coban Wrap; patient's skin tears VERY easily!!   Ciprofloxacin Rash   Sulfa Antibiotics Hives and Rash   Sulfites Hives, Swelling and Rash    Consultations: Palliative care   Procedures/Studies: CT Chest High Resolution  Result Date: 06/13/2022 CLINICAL DATA:  Diffuse interstitial lung disease EXAM: CT CHEST WITHOUT CONTRAST TECHNIQUE: Multidetector CT imaging of the chest was performed following the standard protocol without intravenous contrast. High resolution imaging of the lungs, as well as inspiratory and  expiratory imaging, was performed. RADIATION DOSE REDUCTION: This exam was performed according to the departmental dose-optimization program which includes automated exposure control, adjustment of the mA and/or kV according to patient size and/or use of iterative reconstruction technique. COMPARISON:  01/14/2008 FINDINGS: Cardiovascular: Aortic atherosclerosis. Unchanged enlargement of the tubular ascending thoracic aorta measuring up to 4.5 x 4.4 cm. Mild cardiomegaly. No pericardial effusion. Mediastinum/Nodes: No enlarged mediastinal, hilar, or axillary lymph nodes. Moderate hiatal hernia. Thyroid gland, trachea, and esophagus demonstrate no significant findings. Lungs/Pleura: Moderate pulmonary fibrosis in a pattern without clear apical to basal gradient, featuring irregular peripheral interstitial opacity, septal thickening, associated ground-glass, traction bronchiectasis, and small areas of subpleural bronchiectasis. There is diffuse mosaic attenuation of the airspaces with lobular air trapping on expiratory phase imaging. Fibrotic findings are almost completely new compared to prior examination dated 2009. No pleural effusion or pneumothorax. Upper Abdomen: No acute abnormality. Musculoskeletal: No chest wall abnormality. No acute osseous findings. IMPRESSION: 1. Moderate pulmonary fibrosis in a pattern without clear apical to basal gradient, featuring irregular peripheral interstitial opacity, septal thickening, associated ground-glass, traction bronchiectasis, and small areas of subpleural bronchiectasis. There is diffuse mosaic attenuation of the airspaces with lobular air trapping on expiratory phase imaging. Fibrotic findings are almost completely new compared to prior examination dated 2009. 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 [place], Iss 5, [phone], Nov 13 2016. 2. Ascending thoracic aortic aneurysm. Recommend semi-annual imaging followup by CTA or MRA and referral to cardiothoracic surgery if not already obtained, if clinically appropriate given advanced patient age. This recommendation follows 2010 ACCF/AHA/AATS/ACR/ASA/SCA/SCAI/SIR/STS/SVM Guidelines for the Diagnosis and Management of Patients With Thoracic Aortic Disease. Circulation. 2010; [id number]. Aortic aneurysm NOS (ICD10-I71.9) 3. Hiatal hernia. Aortic Atherosclerosis (ICD10-I70.0). Electronically Signed   By: Delanna Ahmadi M.D.   On: 06/13/2022 15:42   DG Chest Portable 1 View  Result Date: 06/12/2022 CLINICAL DATA:  Short of breath. EXAM: PORTABLE CHEST 1 VIEW COMPARISON:  05/12/2018 for FINDINGS: Stable cardiomediastinal contours. No pleural fluid or interstitial edema. Signs of chronic lung disease is again noted with mild diffuse increased reticular interstitial markings. Asymmetric increased opacification within the periphery of the right upper lobe and the left midlung noted suspicious for superimposed pneumonia. Visualized osseous structures appear intact. IMPRESSION: 1. Asymmetric increased opacification within the periphery of the right upper  lobe and left midlung suspicious for superimposed pneumonia. 2. Chronic lung disease. Electronically Signed   By: Kerby Moors M.D.   On: 06/12/2022 16:58   (Echo, Carotid, EGD, Colonoscopy, ERCP)    Subjective: Patient seen and examined.  She looks comfortable.  Today she was wondering why she is in the hospital.  She has developed some confusion and impulsiveness.  Does not remember conversation that she had with [place].  She tells me that she needs to go home at 10 AM and meet with hospice provider in the afternoon.   Discharge Exam: Vitals:   06/14/22 0416 06/14/22 0728  BP: (!) 150/73 136/74  Pulse: 87 88   Resp: 16 18  Temp: 98 F (36.7 C) 98 F (36.7 C)  SpO2: 99% 99%   Vitals:   06/13/22 2350 06/14/22 0411 06/14/22 0416 06/14/22 0728  BP: (!) 157/84 (!) 150/73 (!) 150/73 136/74  Pulse: 96 91 87 88  Resp:   16 18  Temp: 97.8 F (36.6 C) 97.9 F (36.6 C) 98 F (36.7 C) 98 F (36.7 C)  TempSrc: Oral Oral Oral Oral  SpO2: 97% 98% 99% 99%  Weight: 62.3 kg     Height:        General: Pt is alert, awake, not in acute distress Frail and debilitated. Patient is alert and awake.  She is oriented to herself and somehow to her situation.  She is oriented to time.  Slightly impulsive and anxious today. Cardiovascular: RRR, S1/S2 +, no rubs, no gallops Respiratory: Does have some expiratory crackles. Abdominal: Soft, NT, ND, bowel sounds + Extremities: no edema, no cyanosis    The results of significant diagnostics from this hospitalization (including imaging, microbiology, ancillary and laboratory) are listed below for reference.     Microbiology: Recent Results (from the past 240 hour(s))  SARS Coronavirus 2 by RT PCR (hospital order, performed in [facility] hospital lab) *cepheid single result test* Anterior Nasal Swab     Status: None   Collection Time: 06/12/22 10:56 PM   Specimen: Anterior Nasal Swab  Result Value Ref Range Status   SARS Coronavirus 2 by RT PCR NEGATIVE NEGATIVE Final    Comment: Performed at [facility], [place] [address], [place], [place] [address]     Labs: BNP (last 3 results) Recent Labs    04/07/22 1640 06/12/22 1556  BNP 413.6* [id number]*   Basic Metabolic Panel: Recent Labs  Lab 06/12/22 1556  NA 130*  K 4.7  CL 95*  CO2 23  GLUCOSE 107*  BUN 36*  CREATININE 1.19*  CALCIUM 9.1  MG 2.2   Liver Function Tests: Recent Labs  Lab 06/12/22 1556  AST 30  ALT 19  ALKPHOS 76  BILITOT 0.3  PROT 7.3  ALBUMIN 3.0*   No results for input(s): "LIPASE", "AMYLASE" in the last 168 hours. No results for input(s): "AMMONIA" in the last  168 hours. CBC: Recent Labs  Lab 06/12/22 1556  WBC 6.3  NEUTROABS 4.1  HGB 12.9  HCT 40.9  MCV 84.3  PLT 293   Cardiac Enzymes: No results for input(s): "CKTOTAL", "CKMB", "CKMBINDEX", "TROPONINI" in the last 168 hours. BNP: Invalid input(s): "POCBNP" CBG: No results for input(s): "GLUCAP" in the last 168 hours. D-Dimer No results for input(s): "DDIMER" in the last 72 hours. Hgb A1c No results for input(s): "HGBA1C" in the last 72 hours. Lipid Profile No results for input(s): "CHOL", "HDL", "LDLCALC", "TRIG", "CHOLHDL", "LDLDIRECT" in the last 72 hours. Thyroid  function studies No results for input(s): "TSH", "T4TOTAL", "T3FREE", "THYROIDAB" in the last 72 hours.  Invalid input(s): "FREET3" Anemia work up No results for input(s): "VITAMINB12", "FOLATE", "FERRITIN", "TIBC", "IRON", "RETICCTPCT" in the last 72 hours. Urinalysis    Component Value Date/Time   COLORURINE YELLOW 04/07/2022 1348   APPEARANCEUR HAZY (A) 04/07/2022 1348   LABSPEC 1.015 04/07/2022 1348   PHURINE 5.0 04/07/2022 1348   GLUCOSEU NEGATIVE 04/07/2022 1348   HGBUR SMALL (A) 04/07/2022 1348   BILIRUBINUR NEGATIVE 04/07/2022 1348   KETONESUR NEGATIVE 04/07/2022 1348   PROTEINUR 100 (A) 04/07/2022 1348   UROBILINOGEN 0.2 04/17/2021 1314   NITRITE NEGATIVE 04/07/2022 1348   LEUKOCYTESUR NEGATIVE 04/07/2022 1348   Sepsis Labs Recent Labs  Lab 06/12/22 1556  WBC 6.3   Microbiology Recent Results (from the past 240 hour(s))  SARS Coronavirus 2 by RT PCR (hospital order, performed in [place] hospital lab) *cepheid single result test* Anterior Nasal Swab     Status: None   Collection Time: 06/12/22 10:56 PM   Specimen: Anterior Nasal Swab  Result Value Ref Range Status   SARS Coronavirus 2 by RT PCR NEGATIVE NEGATIVE Final    Comment: Performed at [facility], [place] [address], [place], [place] [address]     Time coordinating discharge:  35 minutes  SIGNED:   Barb Merino,  MD  [facility] 06/14/2022, 1:02 PM

## 2022-06-30 ENCOUNTER — Encounter: Payer: Self-pay | Admitting: Podiatry

## 2022-06-30 ENCOUNTER — Ambulatory Visit (INDEPENDENT_AMBULATORY_CARE_PROVIDER_SITE_OTHER): Payer: Medicare Other | Admitting: Podiatry

## 2022-06-30 DIAGNOSIS — M79674 Pain in right toe(s): Secondary | ICD-10-CM | POA: Diagnosis not present

## 2022-06-30 DIAGNOSIS — M79675 Pain in left toe(s): Secondary | ICD-10-CM

## 2022-06-30 DIAGNOSIS — B351 Tinea unguium: Secondary | ICD-10-CM

## 2022-06-30 DIAGNOSIS — Q828 Other specified congenital malformations of skin: Secondary | ICD-10-CM

## 2022-06-30 NOTE — Progress Notes (Signed)

## 2022-07-05 DIAGNOSIS — M9901 Segmental and somatic dysfunction of cervical region: Secondary | ICD-10-CM | POA: Diagnosis not present

## 2022-07-05 DIAGNOSIS — M9902 Segmental and somatic dysfunction of thoracic region: Secondary | ICD-10-CM | POA: Diagnosis not present

## 2022-07-05 DIAGNOSIS — M9903 Segmental and somatic dysfunction of lumbar region: Secondary | ICD-10-CM | POA: Diagnosis not present

## 2022-07-05 DIAGNOSIS — M4726 Other spondylosis with radiculopathy, lumbar region: Secondary | ICD-10-CM | POA: Diagnosis not present

## 2022-07-14 ENCOUNTER — Telehealth: Payer: Self-pay | Admitting: Hematology

## 2022-07-14 ENCOUNTER — Encounter: Payer: Self-pay | Admitting: Pulmonary Disease

## 2022-07-14 ENCOUNTER — Ambulatory Visit (INDEPENDENT_AMBULATORY_CARE_PROVIDER_SITE_OTHER): Payer: Medicare Other | Admitting: Pulmonary Disease

## 2022-07-14 VITALS — BP 132/76 | HR 66 | Wt 131.5 lb

## 2022-07-14 DIAGNOSIS — I2510 Atherosclerotic heart disease of native coronary artery without angina pectoris: Secondary | ICD-10-CM | POA: Diagnosis not present

## 2022-07-14 DIAGNOSIS — M1990 Unspecified osteoarthritis, unspecified site: Secondary | ICD-10-CM | POA: Diagnosis not present

## 2022-07-14 DIAGNOSIS — H3530 Unspecified macular degeneration: Secondary | ICD-10-CM | POA: Diagnosis not present

## 2022-07-14 DIAGNOSIS — M109 Gout, unspecified: Secondary | ICD-10-CM | POA: Diagnosis not present

## 2022-07-14 DIAGNOSIS — J84112 Idiopathic pulmonary fibrosis: Secondary | ICD-10-CM | POA: Diagnosis not present

## 2022-07-14 DIAGNOSIS — J9690 Respiratory failure, unspecified, unspecified whether with hypoxia or hypercapnia: Secondary | ICD-10-CM | POA: Diagnosis not present

## 2022-07-14 DIAGNOSIS — I130 Hypertensive heart and chronic kidney disease with heart failure and stage 1 through stage 4 chronic kidney disease, or unspecified chronic kidney disease: Secondary | ICD-10-CM | POA: Diagnosis not present

## 2022-07-14 DIAGNOSIS — I509 Heart failure, unspecified: Secondary | ICD-10-CM | POA: Diagnosis not present

## 2022-07-14 DIAGNOSIS — N1832 Chronic kidney disease, stage 3b: Secondary | ICD-10-CM | POA: Diagnosis not present

## 2022-07-14 DIAGNOSIS — Z87440 Personal history of urinary (tract) infections: Secondary | ICD-10-CM | POA: Diagnosis not present

## 2022-07-14 DIAGNOSIS — E871 Hypo-osmolality and hyponatremia: Secondary | ICD-10-CM | POA: Diagnosis not present

## 2022-07-14 DIAGNOSIS — J849 Interstitial pulmonary disease, unspecified: Secondary | ICD-10-CM | POA: Diagnosis not present

## 2022-07-14 DIAGNOSIS — I319 Disease of pericardium, unspecified: Secondary | ICD-10-CM | POA: Diagnosis not present

## 2022-07-14 DIAGNOSIS — J189 Pneumonia, unspecified organism: Secondary | ICD-10-CM | POA: Diagnosis not present

## 2022-07-14 DIAGNOSIS — I712 Thoracic aortic aneurysm, without rupture, unspecified: Secondary | ICD-10-CM | POA: Diagnosis not present

## 2022-07-14 DIAGNOSIS — I872 Venous insufficiency (chronic) (peripheral): Secondary | ICD-10-CM | POA: Diagnosis not present

## 2022-07-14 DIAGNOSIS — F32A Depression, unspecified: Secondary | ICD-10-CM | POA: Diagnosis not present

## 2022-07-14 DIAGNOSIS — D631 Anemia in chronic kidney disease: Secondary | ICD-10-CM | POA: Diagnosis not present

## 2022-07-14 DIAGNOSIS — Z87891 Personal history of nicotine dependence: Secondary | ICD-10-CM | POA: Diagnosis not present

## 2022-07-14 NOTE — Patient Instructions (Signed)
Nice to meet you today  You have scarring in the lung or fibrosis.  This likely has worsened over time, presently since 2018 on my review of serial CT scans and chest x-rays.  I think the need for oxygen related some other acute process with RV volume overload or pulmonary edema from congestive heart failure or viral pneumonia.  It seems like these processes have improved and you no longer need oxygen.  Continue to check your oxygen, goal is 88% at rest and with exertion.  If you find your oxygen drops below 88% with exertion I encourage you to wear oxygen with exertion, any time you do exert yourself.  Return to clinic as needed

## 2022-07-14 NOTE — Progress Notes (Signed)
@Patient  ID: [name], female    DOB: Sep 01, 1929, 89 y.o.   MRN: [id number]  Chief Complaint  Patient presents with   Follow-up    Referring provider: Emilio Aspen, *  HPI:   89 y.o. woman whom we are seeing in consultation for evaluation of chronic hypoxemic respiratory failure and abnormal CT images of the chest.  Recent discharge summary x 2 reviewed.  Patient has a history of congestive heart failure.  Hospitalized 03/2022.  Elevated BNP.  Short of breath.  Was diuresed.  Improved symptomatically.  Presented to the hospital 05/2022 with similar concerns.  Was noted to be hypoxemic.  Chest x-ray showed worsened course nodular interstitial and alveolar filling pattern on my review and interpretation compared to 03/2022 and certainly much more prominent interstitial changes compared to 01/2021.  CT high-resolution was subsequently obtained.  This demonstrated diffuse interlobular septal thickening primarily in the periphery but present really all this throughout the lung with minimal frank honeycombing and scattered peribronchovascular groundglass opacities concerning for atypical/viral infection versus pulmonary edema.  BNP was elevated.  She received antibiotics and IV Lasix.  Discharged on Lasix.  Discharged on oxygen.  Discharge to hospice.  With time, her oxygen needs have decreased.  No longer needs at rest.  Reportedly ambulates and does not drop below 90 to 92%.  Discussed at length that likely she had a secondary illness, CHF versus viral pneumonia that is improved with time and Lasix therapy.  This likely accounts for her improvement in oxygen.  Reviewed CT scans dating back 2022 and 2018 cervical spine x 2 as well as abdomen and pelvis x 1 that demonstrates interlobular septal thickening signs of ILD.  Suspect is worsened over time but fortunately it appears that after getting a more acute illness, she no longer needs oxygen.    Questionaires / Pulmonary Flowsheets:    ACT:      No data to display          MMRC:     No data to display          Epworth:      No data to display          Tests:   FENO:  No results found for: "NITRICOXIDE"  PFT:     No data to display          WALK:      No data to display          Imaging: No results found. Personally reviewed and as per EMR discussion this note Lab Results: Personally reviewed and as per EMR CBC    Component Value Date/Time   WBC 6.3 06/12/2022 1556   RBC 4.85 06/12/2022 1556   HGB 12.9 06/12/2022 1556   HGB 8.7 (L) 02/17/2021 1001   HGB 10.8 (L) 12/26/2007 1539   HCT 40.9 06/12/2022 1556   HCT 28.2 (L) 02/17/2021 1001   HCT 33.4 (L) 12/26/2007 1539   PLT 293 06/12/2022 1556   PLT 278 02/17/2021 1001   PLT 314 12/26/2007 1539   MCV 84.3 06/12/2022 1556   MCV 88.5 12/26/2007 1539   MCH 26.6 06/12/2022 1556   MCHC 31.5 06/12/2022 1556   RDW 15.9 (H) 06/12/2022 1556   RDW 15.3 (H) 12/26/2007 1539   LYMPHSABS 0.8 06/12/2022 1556   LYMPHSABS 2.2 12/26/2007 1539   MONOABS 1.0 06/12/2022 1556   MONOABS 0.8 12/26/2007 1539   EOSABS 0.4 06/12/2022 1556   EOSABS 0.6 (H) 12/26/2007 1539  BASOSABS 0.1 06/12/2022 1556   BASOSABS 0.1 12/26/2007 1539    BMET    Component Value Date/Time   NA 130 (L) 06/12/2022 1556   NA 136 08/25/2020 1330   K 4.7 06/12/2022 1556   CL 95 (L) 06/12/2022 1556   CO2 23 06/12/2022 1556   GLUCOSE 107 (H) 06/12/2022 1556   BUN 36 (H) 06/12/2022 1556   BUN 38 (H) 08/25/2020 1330   CREATININE 1.19 (H) 06/12/2022 1556   CREATININE 1.02 (H) 02/01/2022 1220   CREATININE 1.05 (H) 07/15/2021 1004   CALCIUM 9.1 06/12/2022 1556   GFRNONAA 43 (L) 06/12/2022 1556   GFRNONAA 52 (L) 02/01/2022 1220   GFRAA >60 11/12/2016 1751    BNP    Component Value Date/Time   BNP 195.3 (H) 06/12/2022 1556    ProBNP No results found for: "PROBNP"  Specialty Problems       Pulmonary Problems   CAP (community acquired pneumonia)     Allergies  Allergen Reactions   Cephalexin Other (See Comments)    Pt states this caused eye problems and STROKE-LIKE SYMPTOMS!!   Shellfish-Derived Products Hives and Rash   Clindamycin/Lincomycin Rash   Lincomycin Rash   Clindamycin Hcl Other (See Comments)    "Per pharmacist"   Fosfomycin Other (See Comments)    Disorientation   Levofloxacin Other (See Comments)    Reaction not recalled by the patient   Penicillamine Other (See Comments)    Reaction not recalled by the patient   Sugar-Protein-Starch Other (See Comments)    Sugary foods cause legs to cramp   Tape Other (See Comments)    Please use paper tape or Coban Wrap; patient's skin tears VERY easily!!   Ciprofloxacin Rash   Sulfa Antibiotics Hives and Rash   Sulfites Hives, Swelling and Rash    Immunization History  Administered Date(s) Administered   Influenza Split 01/04/2009, 12/08/2009, 11/30/2010, 12/05/2011, 11/20/2012, 12/09/2018, 11/12/2019, 12/11/2020   Influenza, High Dose Seasonal PF 12/10/2013, 11/26/2016, 11/18/2017, 11/25/2018   Influenza,inj,quad, With Preservative 11/13/2017   Moderna Sars-Covid-2 Vaccination 04/13/2019, 05/03/2019, 11/14/2019   Pneumococcal Conjugate-13 09/17/2013   Pneumococcal Polysaccharide-23 08/29/2006   Td 08/28/2004, 01/19/2012   Tdap 02/02/2012, 09/01/2021   Zoster, Live 08/29/2006    Past Medical History:  Diagnosis Date   Anemia    CHRONIC   Aortic insufficiency    a. 2D echo 08/29/15: EF 55-60%, mild LVH, diastolic dysfunction, elevated LV filling pressure, mild AI, severe LAE, mild RAE, mild TR, dilated descending thoracic aorta just distal to the takeoff of the left subclavian, measuring 4.1 cm, no pericardial effusion.   Arthritis    OA-SOME BACK AND NECK PAIN,  GOES TO CHIROPRACTOR TWICE A MONTH;  HX OF JOINT REPLACEMENTS    Cancer (HCC)    SKIN CANCERS REMOVED FROM LEGS   CKD (chronic kidney disease), stage II    Depression    Essential hypertension     Gait abnormality 09/08/2017   GERD (gastroesophageal reflux disease)    Low sodium levels    Myocarditis (HCC)    PSVT (paroxysmal supraventricular tachycardia)    Recurrent Pericarditis    RECURRENT    Thoracic aneurysm    a. Followed by [facility] -last seen in 12/2013 w/ plan for f/u MRA and thoracic surgery f/u in 2 yrs, but patient does not wish to follow any longer.    Tobacco History: Social History   Tobacco Use  Smoking Status Former   Years: 5   Types: Cigarettes  Quit date: 80   Years since quitting: 59.3  Smokeless Tobacco Never  Tobacco Comments   quit age 59   Counseling given: Not Answered Tobacco comments: quit age 66   Continue to not smoke  Outpatient Encounter Medications as of 07/14/2022  Medication Sig   allopurinol (ZYLOPRIM) 100 MG tablet Take 50 mg by mouth in the morning.   amoxicillin (AMOXIL) 500 MG capsule Take 2,000 mg by mouth See admin instructions. Take 2,000 mg by mouth one hour prior to dental procedures   B Complex Vitamins (VITAMIN B COMPLEX) TABS Take 1 tablet by mouth daily with breakfast.   Cholecalciferol (VITAMIN D3) 25 MCG (1000 UT) CAPS Take 1,000 Units by mouth daily.   diltiazem (CARDIZEM CD) 240 MG 24 hr capsule Take 1 capsule (240 mg total) by mouth daily.   FEROSUL 325 (65 Fe) MG tablet Take 650 mg by mouth daily with breakfast.   furosemide (LASIX) 20 MG tablet Take 1 tablet (20 mg total) by mouth every other day.   hydrOXYzine (ATARAX) 25 MG tablet Take 25 mg by mouth 3 (three) times daily.   LORazepam (ATIVAN) 0.5 MG tablet Take 1 tablet (0.5 mg total) by mouth every 4 (four) hours as needed for anxiety. May crush, mix with water and give sublingually if needed.   MITIGARE 0.6 MG CAPS Take 0.6 mg by mouth in the morning.   Multiple Vitamins-Minerals (SYSTANE ICAPS AREDS2 PO) Take 1 capsule by mouth in the morning and at bedtime.   NONFORMULARY OR COMPOUNDED ITEM Apply 1 application  topically See admin instructions.  Triamcinolone 0.1% cream/Cetaphil compounded cream- Apply to the back, legs, and arms 2 times a day as directed   sodium chloride 1 g tablet Take 1 g by mouth daily.   triamcinolone cream (KENALOG) 0.1 % Apply topically 3 (three) times daily.   TYLENOL 500 MG tablet Take 500-1,000 mg by mouth every 6 (six) hours as needed for mild pain or headache.   albuterol (PROVENTIL) (2.5 MG/3ML) 0.083% nebulizer solution Take 3 mLs (2.5 mg total) by nebulization every 4 (four) hours as needed for wheezing or shortness of breath. (Patient not taking: Reported on 07/14/2022)   aspirin 81 MG tablet Take 81 mg by mouth at bedtime. (Patient not taking: Reported on 07/14/2022)   carvedilol (COREG) 12.5 MG tablet Take 1 tablet (12.5 mg total) by mouth 2 (two) times daily with a meal.   losartan (COZAAR) 25 MG tablet Take 1 tablet (25 mg total) by mouth daily.   morphine (ROXANOL) 20 MG/ML concentrated solution Take 0.5 mLs (10 mg total) by mouth every 4 (four) hours as needed for severe pain, shortness of breath or anxiety. May give sublingually if needed. (Patient not taking: Reported on 07/14/2022)   potassium chloride (KLOR-CON M) 10 MEQ tablet Take 1 tablet (10 mEq total) by mouth daily for 15 days.   No facility-administered encounter medications on file as of 07/14/2022.     Review of Systems  Review of Systems  No chest pain with exertion.  No orthopnea or PND.  Comprehensive review of systems otherwise negative. Physical Exam  BP 132/76 (BP Location: Left Arm, Cuff Size: Normal)   Pulse 66   Wt 131 lb 8 oz (59.6 kg)   SpO2 96%   BMI 23.29 kg/m   Wt Readings from Last 5 Encounters:  07/14/22 131 lb 8 oz (59.6 kg)  06/13/22 137 lb 5.6 oz (62.3 kg)  04/12/22 140 lb 10.5 oz (63.8 kg)  03/17/22  151 lb (68.5 kg)  03/05/22 143 lb (64.9 kg)    BMI Readings from Last 5 Encounters:  07/14/22 23.29 kg/m  06/13/22 24.33 kg/m  04/12/22 25.73 kg/m  03/17/22 27.62 kg/m  03/05/22 24.17 kg/m      Physical Exam General: Sitting in chair, no acute distress Eyes: EOMI, no icterus Neck: Supple, no JVP Pulmonary: Diffuse inspiratory rhonchi and coarse crackles most prominent in the left base and left side but present throughout, normal work of breathing on room air Cardiovascular warm, no edema Abdomen: Nondistended, bowel pale present MSK: No synovitis, no joint effusion Neuro: Normal gait, no weakness Psych: Normal mood, full affect   Assessment & Plan:   Acute hypoxemic respiratory failure: Suspect acute illness, CHF or volume overload versus viral pneumonia given scattered groundglass opacities, peribronchovascular nature and elevated BNP raises most likely concern for pulmonary edema.  Improved with time of Lasix therapy.  Encouraged to continue to check oxygen, goal 88% at rest and with exertion.  If able to maintain this at rest and with exertion okay to stay off oxygen.  ILD: Diffuse peripheral interlobular septal thickening presently since 2019 on review of serial cross-sectional imaging.  Unclear pattern of my eye, does not appear consistent with UIP, possible NSIP.  She has a history of RA, potentially connective tissue disease related.  Given her advanced age and chronicity of disease, had frank discussion about immunosuppressants and antifibrotic's.  After shared decision-making, we decided to not pursue these therapies at this time.  Return if symptoms worsen or fail to improve.   Karren Burly, MD 07/14/2022   This appointment required 60 minutes of patient care (this includes precharting, chart review, review of results, face-to-face care, etc.).

## 2022-07-28 ENCOUNTER — Ambulatory Visit: Payer: Medicare Other | Admitting: Physician Assistant

## 2022-07-28 NOTE — Progress Notes (Deleted)
  Cardiology Office Note:    Date:  07/28/2022  ID:  [name], DOB 1929-05-11, MRN [id number] PCP: Emilio Aspen, MD  [facility] Providers Cardiologist:  Lesleigh Noe, MD (Inactive) { Click to update primary MD,subspecialty MD or APP then REFRESH:1}    {Click to Open Review  :1}      Patient Profile:   Ascending thoracic aortic aneurysm Aortic insufficiency  Paroxysmal Supraventricular Tachycardia  Hx of recurrent pericarditis  (HFpEF) heart failure with preserved ejection fraction  Admitted 03/2022 TTE 04/09/2022: EF 60-65, no RWMA, GR 2 DD, normal RVSF, normal PASP, severe LAE, trivial MR, mild-moderate AI, AV sclerosis, dilated aortic root (38 mm), dilated ascending aorta (41 mm), RAP 3 ILD (Interstitial Lung Disease)  Hypertension  Chronic kidney disease  Anemia Chronic hyponatremia DNR       History of Present Illness:   [name] is a 89 y.o. female who returns for f/u of CHF, AI, thoracic aortic aneurysm. She was last seen by Dr. Katrinka Blazing in 10/2021. She was admitted in 03/2022 with acute (HFpEF) heart failure with preserved ejection fraction. She was admitted again 3/30-4/1 with acute hypoxic respiratory failure due to community acquired pneumonia. She is DNR. She desired no further treatment other than antibiotics, O2, bronchodilators. She was discharged home with Hospice. ***  ROS ***    Studies Reviewed:    EKG:  ***  *** Risk Assessment/Calculations:   {Does this patient have ATRIAL FIBRILLATION?:[id number]} No BP recorded.  {Refresh Note OR Click here to enter BP  :1}***       Physical Exam:   VS:  There were no vitals taken for this visit.   Wt Readings from Last 3 Encounters:  07/14/22 131 lb 8 oz (59.6 kg)  06/13/22 137 lb 5.6 oz (62.3 kg)  04/12/22 140 lb 10.5 oz (63.8 kg)    Physical Exam***    ASSESSMENT AND PLAN:   No problem-specific Assessment & Plan notes found for this encounter. ***    {Are you ordering a CV  Procedure (e.g. stress test, cath, DCCV, TEE, etc)?   Press F2        :[id number]}  Dispo:  No follow-ups on file.  Signed, Tereso Newcomer, PA-C

## 2022-07-29 NOTE — Progress Notes (Deleted)
Cardiology Office Note:    Date:  07/29/2022   ID:  MALI WALP, DOB 03-27-1929, MRN [id number]  PCP:  Emilio Aspen, MD   [facility] Providers Cardiologist:  Lesleigh Noe, MD (Inactive) { Click to update primary MD,subspecialty MD or APP then REFRESH:1}    Referring MD: Emilio Aspen, *   Chief Complaint: ***  History of Present Illness:    [name] is a *** 89 y.o. female with a hx of ascending aortic aneurysm, HTN, CKD, mild aortic regurgitation, iron deficiency anemia, paroxysmal SVT, and aortic atherosclerosis.   Longtime patient of Dr. Katrinka Blazing. Per review of his records, she had previously refused subsequent follow-up of ascending aortic aneurysm in 2015 with measurement at that time 4.6 cm.  She reported she is a DNR.   Last cardiology clinic visit was 10/19/2021 with Dr. Katrinka Blazing.  Amlodipine was added for elevated blood pressure.  She did not want to consider taking ACE/ARB due to history of CKD. Dr. Katrinka Blazing encouraged better BP control. He noted history of self-adjusting medications in the past.  She underwent CT chest high-resolution 06/13/2022 for follow-up of pulmonary fibrosis. Ascending thoracic aorta measuring 4.5 x 4.4 cm, stable in comparison to previous CT, mild cardiomegaly, no pericardial effusion, aortic atherosclerosis noted  Today, she is here   Past Medical History:  Diagnosis Date   Anemia    CHRONIC   Aortic insufficiency    a. 2D echo 08/29/15: EF 55-60%, mild LVH, diastolic dysfunction, elevated LV filling pressure, mild AI, severe LAE, mild RAE, mild TR, dilated descending thoracic aorta just distal to the takeoff of the left subclavian, measuring 4.1 cm, no pericardial effusion.   Arthritis    OA-SOME BACK AND NECK PAIN,  GOES TO CHIROPRACTOR TWICE A MONTH;  HX OF JOINT REPLACEMENTS    Cancer (HCC)    SKIN CANCERS REMOVED FROM LEGS   CKD (chronic kidney disease), stage II    Depression    Essential hypertension     Gait abnormality 09/08/2017   GERD (gastroesophageal reflux disease)    Low sodium levels    Myocarditis (HCC)    PSVT (paroxysmal supraventricular tachycardia)    Recurrent Pericarditis    RECURRENT    Thoracic aneurysm    a. Followed by [facility] -last seen in 12/2013 w/ plan for f/u MRA and thoracic surgery f/u in 2 yrs, but patient does not wish to follow any longer.    Past Surgical History:  Procedure Laterality Date   APPENDECTOMY     BREAST EXCISIONAL BIOPSY Left 1992   benign   EXCISION/RELEASE BURSA HIP Left 11/24/2012   Procedure: LEFT HIP BURSECTOMY AND TENDON REPAIR ;  Surgeon: Loanne Drilling, MD;  Location: [facility];  Service: Orthopedics;  Laterality: Left;   HIP CLOSED REDUCTION Left 12/19/2012   Procedure: CLOSED MANIPULATION HIP;  Surgeon: Shelda Pal, MD;  Location: [facility];  Service: Orthopedics;  Laterality: Left;   HIP CLOSED REDUCTION Left 12/05/2013   Procedure: CLOSED MANIPULATION HIP;  Surgeon: Verlee Rossetti, MD;  Location: [facility];  Service: Orthopedics;  Laterality: Left;   HIP CLOSED REDUCTION Left 09/22/2015   Procedure: CLOSED REDUCTION HIP;  Surgeon: Venita Lick, MD;  Location: [facility];  Service: Orthopedics;  Laterality: Left;   JOINT REPLACEMENT     LEFT TOTAL HIP REPLACEMENT AND REVISIONS X 2   OOPHORECTOMY     has a partial of one ovary remaingin   PELVIC LAPAROSCOPY  ovarian cyst removal,    REVISION TOTAL HIP ARTHROPLASTY     left   right Achilles Tendon repair  5/13   TOTAL KNEE ARTHROPLASTY     right   TOTAL KNEE ARTHROPLASTY     left   VAGINAL HYSTERECTOMY     with ovarian cyst removal    Current Medications: No outpatient medications have been marked as taking for the 08/02/22 encounter (Appointment) with Lissa Hoard Zachary George, NP.     Allergies:   Cephalexin, Shellfish-derived products, Clindamycin/lincomycin, Lincomycin, Clindamycin hcl, Fosfomycin, Levofloxacin, Penicillamine, Sugar-protein-starch, Tape, Ciprofloxacin, Sulfa  antibiotics, and Sulfites   Social History   Socioeconomic History   Marital status: Widowed    Spouse name: Not on file   Number of children: Not on file   Years of education: Not on file   Highest education level: Not on file  Occupational History   Not on file  Tobacco Use   Smoking status: Former    Years: 5    Types: Cigarettes    Quit date: 42    Years since quitting: 59.4   Smokeless tobacco: Never   Tobacco comments:    quit age 32  Vaping Use   Vaping Use: Never used  Substance and Sexual Activity   Alcohol use: No    Alcohol/week: 0.0 standard drinks of alcohol   Drug use: Not Currently   Sexual activity: Not Currently    Birth control/protection: Surgical    Comment: HYST-1st intercourse 89 yo-Fewer than 5 partners  Other Topics Concern   Not on file  Social History Narrative   Diet: very good      Caffeine: 1 1/2 cup coffee (usually decaf)      Married, if yes what year: Widow, 1954      Do you live in a house, apartment, assisted living, condo, trailer, ect: [place], independent living      Is it one or more stories: 2nd level. elevate      How many persons live in your home? 1      Pets: No      Highest level or education completed: 14      Current/Past profession: Health and safety inspector      Exercise:  Yes                Type and how often: aerobics twice daily         Living Will: Yes   DNR: Yes   POA/HPOA: Yes      Functional Status:   Do you have difficulty bathing or dressing yourself? No   Do you have difficulty preparing food or eating? No   Do you have difficulty managing your medications? No   Do you have difficulty managing your finances? No   Do you have difficulty affording your medications? No   Social Determinants of Health   Financial Resource Strain: Not on file  Food Insecurity: No Food Insecurity (04/08/2022)   Hunger Vital Sign    Worried About Running Out of Food in the Last Year: Never true    Ran Out of  Food in the Last Year: Never true  Transportation Needs: No Transportation Needs (04/08/2022)   PRAPARE - Administrator, Civil Service (Medical): No    Lack of Transportation (Non-Medical): No  Physical Activity: Not on file  Stress: Not on file  Social Connections: Not on file     Family History: The patient's ***family history includes ALS in an other family  member; Aortic aneurysm in her sister, sister, and son; Heart attack (age of onset: 9) in her father; Heart failure in her father and sister; Hyperlipidemia in her daughter; Hypertension in her mother; Pneumonia in her brother; Stroke in her brother, mother, and sister. There is no history of Breast cancer.  ROS:   Please see the history of present illness.    *** All other systems reviewed and are negative.  Labs/Other Studies Reviewed:    The following studies were reviewed today: ***  Recent Labs: 04/07/2022: TSH 3.479 06/12/2022: ALT 19; B Natriuretic Peptide 195.3; BUN 36; Creatinine, Ser 1.19; Hemoglobin 12.9; Magnesium 2.2; Platelets 293; Potassium 4.7; Sodium 130  Recent Lipid Panel No results found for: "CHOL", "TRIG", "HDL", "CHOLHDL", "VLDL", "LDLCALC", "LDLDIRECT"   Risk Assessment/Calculations:   {Does this patient have ATRIAL FIBRILLATION?[phone]}       Physical Exam:    VS:  There were no vitals taken for this visit.    Wt Readings from Last 3 Encounters:  07/14/22 131 lb 8 oz (59.6 kg)  06/13/22 137 lb 5.6 oz (62.3 kg)  04/12/22 140 lb 10.5 oz (63.8 kg)     GEN: *** Well nourished, well developed in no acute distress HEENT: Normal NECK: No JVD; No carotid bruits CARDIAC: ***RRR, no murmurs, rubs, gallops RESPIRATORY:  Clear to auscultation without rales, wheezing or rhonchi  ABDOMEN: Soft, non-tender, non-distended MUSCULOSKELETAL:  No edema; No deformity. *** pedal pulses, ***bilaterally SKIN: Warm and dry NEUROLOGIC:  Alert and oriented x 3 PSYCHIATRIC:  Normal affect   EKG:   EKG is *** ordered today.  The ekg ordered today demonstrates ***  No BP recorded.  {Refresh Note OR Click here to enter BP  :1}***    Diagnoses:    No diagnosis found. Assessment and Plan:     Thoracic aortic aneurysm: Noted to be 4.5 x 4.4 cm and stable in comparison to previous measurement on CT chest 06/13/22  Aortic valve insufficiency: Mild calcification of the aortic valve, regurgitation is mild to moderate, no evidence of aortic valve stenosis on echo 03/2022  SVT:  Hypertension:   {Are you ordering a CV Procedure (e.g. stress test, cath, DCCV, TEE, etc)?   Press F2        :[id number]}   Disposition:  Medication Adjustments/Labs and Tests Ordered: Current medicines are reviewed at length with the patient today.  Concerns regarding medicines are outlined above.  No orders of the defined types were placed in this encounter.  No orders of the defined types were placed in this encounter.   There are no Patient Instructions on file for this visit.   Signed, Levi Aland, NP  07/29/2022 12:37 PM    [facility]

## 2022-07-30 ENCOUNTER — Other Ambulatory Visit: Payer: Self-pay | Admitting: *Deleted

## 2022-07-30 DIAGNOSIS — D649 Anemia, unspecified: Secondary | ICD-10-CM

## 2022-07-30 DIAGNOSIS — D509 Iron deficiency anemia, unspecified: Secondary | ICD-10-CM

## 2022-08-02 ENCOUNTER — Inpatient Hospital Stay: Payer: Medicare Other | Attending: Hematology

## 2022-08-02 ENCOUNTER — Inpatient Hospital Stay (HOSPITAL_BASED_OUTPATIENT_CLINIC_OR_DEPARTMENT_OTHER): Payer: Medicare Other | Admitting: Hematology

## 2022-08-02 ENCOUNTER — Ambulatory Visit: Payer: Medicare Other | Admitting: Nurse Practitioner

## 2022-08-02 ENCOUNTER — Other Ambulatory Visit: Payer: Self-pay

## 2022-08-02 VITALS — HR 81 | Temp 98.1°F | Resp 20 | Ht 63.0 in | Wt 133.9 lb

## 2022-08-02 DIAGNOSIS — Z8249 Family history of ischemic heart disease and other diseases of the circulatory system: Secondary | ICD-10-CM | POA: Diagnosis not present

## 2022-08-02 DIAGNOSIS — D649 Anemia, unspecified: Secondary | ICD-10-CM

## 2022-08-02 DIAGNOSIS — Z823 Family history of stroke: Secondary | ICD-10-CM | POA: Insufficient documentation

## 2022-08-02 DIAGNOSIS — Z8349 Family history of other endocrine, nutritional and metabolic diseases: Secondary | ICD-10-CM | POA: Diagnosis not present

## 2022-08-02 DIAGNOSIS — D509 Iron deficiency anemia, unspecified: Secondary | ICD-10-CM

## 2022-08-02 DIAGNOSIS — I509 Heart failure, unspecified: Secondary | ICD-10-CM | POA: Insufficient documentation

## 2022-08-02 DIAGNOSIS — M069 Rheumatoid arthritis, unspecified: Secondary | ICD-10-CM | POA: Insufficient documentation

## 2022-08-02 DIAGNOSIS — Z87891 Personal history of nicotine dependence: Secondary | ICD-10-CM | POA: Insufficient documentation

## 2022-08-02 DIAGNOSIS — N182 Chronic kidney disease, stage 2 (mild): Secondary | ICD-10-CM | POA: Diagnosis not present

## 2022-08-02 DIAGNOSIS — Z79899 Other long term (current) drug therapy: Secondary | ICD-10-CM | POA: Insufficient documentation

## 2022-08-02 LAB — CBC WITH DIFFERENTIAL (CANCER CENTER ONLY)
Abs Immature Granulocytes: 0.04 10*3/uL (ref 0.00–0.07)
Basophils Absolute: 0.2 10*3/uL — ABNORMAL HIGH (ref 0.0–0.1)
Basophils Relative: 2 %
Eosinophils Absolute: 0.7 10*3/uL — ABNORMAL HIGH (ref 0.0–0.5)
Eosinophils Relative: 7 %
HCT: 29.3 % — ABNORMAL LOW (ref 36.0–46.0)
Hemoglobin: 9.3 g/dL — ABNORMAL LOW (ref 12.0–15.0)
Immature Granulocytes: 0 %
Lymphocytes Relative: 19 %
Lymphs Abs: 1.9 10*3/uL (ref 0.7–4.0)
MCH: 27.4 pg (ref 26.0–34.0)
MCHC: 31.7 g/dL (ref 30.0–36.0)
MCV: 86.2 fL (ref 80.0–100.0)
Monocytes Absolute: 1 10*3/uL (ref 0.1–1.0)
Monocytes Relative: 10 %
Neutro Abs: 6 10*3/uL (ref 1.7–7.7)
Neutrophils Relative %: 62 %
Platelet Count: 278 10*3/uL (ref 150–400)
RBC: 3.4 MIL/uL — ABNORMAL LOW (ref 3.87–5.11)
RDW: 17.6 % — ABNORMAL HIGH (ref 11.5–15.5)
WBC Count: 9.8 10*3/uL (ref 4.0–10.5)
nRBC: 0 % (ref 0.0–0.2)

## 2022-08-02 LAB — IRON AND IRON BINDING CAPACITY (CC-WL,HP ONLY)
Iron: 40 ug/dL (ref 28–170)
Saturation Ratios: 13 % (ref 10.4–31.8)
TIBC: 305 ug/dL (ref 250–450)
UIBC: 265 ug/dL (ref 148–442)

## 2022-08-02 LAB — CMP (CANCER CENTER ONLY)
ALT: 19 U/L (ref 0–44)
AST: 26 U/L (ref 15–41)
Albumin: 3.8 g/dL (ref 3.5–5.0)
Alkaline Phosphatase: 71 U/L (ref 38–126)
Anion gap: 8 (ref 5–15)
BUN: 44 mg/dL — ABNORMAL HIGH (ref 8–23)
CO2: 26 mmol/L (ref 22–32)
Calcium: 9.3 mg/dL (ref 8.9–10.3)
Chloride: 100 mmol/L (ref 98–111)
Creatinine: 1.29 mg/dL — ABNORMAL HIGH (ref 0.44–1.00)
GFR, Estimated: 39 mL/min — ABNORMAL LOW (ref >60)
Glucose, Bld: 136 mg/dL — ABNORMAL HIGH (ref 70–99)
Potassium: 5.1 mmol/L (ref 3.5–5.1)
Sodium: 134 mmol/L — ABNORMAL LOW (ref 135–145)
Total Bilirubin: 0.3 mg/dL (ref 0.3–1.2)
Total Protein: 7.6 g/dL (ref 6.5–8.1)

## 2022-08-02 LAB — SAMPLE TO BLOOD BANK

## 2022-08-02 LAB — VITAMIN B12: Vitamin B-12: 667 pg/mL (ref 180–914)

## 2022-08-02 LAB — FERRITIN: Ferritin: 217 ng/mL (ref 11–307)

## 2022-08-02 NOTE — Progress Notes (Signed)
HEMATOLOGY/ONCOLOGY CLINIC NOTE  Date of Service: 08/02/2022   Patient Care Team: Emilio Aspen, MD as PCP - General (Internal Medicine) Lyn Records, MD (Inactive) as PCP - Cardiology (Cardiology)  CHIEF COMPLAINTS/PURPOSE OF CONSULTATION:  Continued management of normocytic anemia.  HISTORY OF PRESENTING ILLNESS:  Please see previous notes for details on initial presentation  INTERVAL HISTORY: [name] is a 89 y.o. female here for follow-up of her normocytic anemia.  Patient was last seen by me on 02/01/2022 and she was doing well overall.   Patient reports she has been doing fairly well since our last visit. She notes she has been admitted to the ED 1-2 times since our last visit due to pneumonia and heart failure. She was in hospice after her last ED visit. However, she is not in hospice anymore. She is doing better after her last hospitalization.   She denies fever, chills, night sweats, infection issues, abdominal pain, chest pain, back pain, shortness of breath, appetite loss, or leg swelling. She does complain of fatigue/tiredness throughout the day.   She regularly takes iron supplement and B-Complex supplement without any new or severe toxicities.   MEDICAL HISTORY:  Past Medical History:  Diagnosis Date   Anemia    CHRONIC   Aortic insufficiency    a. 2D echo 08/29/15: EF 55-60%, mild LVH, diastolic dysfunction, elevated LV filling pressure, mild AI, severe LAE, mild RAE, mild TR, dilated descending thoracic aorta just distal to the takeoff of the left subclavian, measuring 4.1 cm, no pericardial effusion.   Arthritis    OA-SOME BACK AND NECK PAIN,  GOES TO CHIROPRACTOR TWICE A MONTH;  HX OF JOINT REPLACEMENTS    Cancer (HCC)    SKIN CANCERS REMOVED FROM LEGS   CKD (chronic kidney disease), stage II    Depression    Essential hypertension    Gait abnormality 09/08/2017   GERD (gastroesophageal reflux disease)    Low sodium levels     Myocarditis (HCC)    PSVT (paroxysmal supraventricular tachycardia)    Recurrent Pericarditis    RECURRENT    Thoracic aneurysm    a. Followed by [facility] -last seen in 12/2013 w/ plan for f/u MRA and thoracic surgery f/u in 2 yrs, but patient does not wish to follow any longer.    SURGICAL HISTORY: Past Surgical History:  Procedure Laterality Date   APPENDECTOMY     BREAST EXCISIONAL BIOPSY Left 1992   benign   EXCISION/RELEASE BURSA HIP Left 11/24/2012   Procedure: LEFT HIP BURSECTOMY AND TENDON REPAIR ;  Surgeon: Loanne Drilling, MD;  Location: [facility];  Service: Orthopedics;  Laterality: Left;   HIP CLOSED REDUCTION Left 12/19/2012   Procedure: CLOSED MANIPULATION HIP;  Surgeon: Shelda Pal, MD;  Location: [facility];  Service: Orthopedics;  Laterality: Left;   HIP CLOSED REDUCTION Left 12/05/2013   Procedure: CLOSED MANIPULATION HIP;  Surgeon: Verlee Rossetti, MD;  Location: [facility];  Service: Orthopedics;  Laterality: Left;   HIP CLOSED REDUCTION Left 09/22/2015   Procedure: CLOSED REDUCTION HIP;  Surgeon: Venita Lick, MD;  Location: [facility];  Service: Orthopedics;  Laterality: Left;   JOINT REPLACEMENT     LEFT TOTAL HIP REPLACEMENT AND REVISIONS X 2   OOPHORECTOMY     has a partial of one ovary remaingin   PELVIC LAPAROSCOPY     ovarian cyst removal,    REVISION TOTAL HIP ARTHROPLASTY     left   right Achilles  Tendon repair  5/13   TOTAL KNEE ARTHROPLASTY     right   TOTAL KNEE ARTHROPLASTY     left   VAGINAL HYSTERECTOMY     with ovarian cyst removal    SOCIAL HISTORY: Social History   Socioeconomic History   Marital status: Widowed    Spouse name: Not on file   Number of children: Not on file   Years of education: Not on file   Highest education level: Not on file  Occupational History   Not on file  Tobacco Use   Smoking status: Former    Years: 5    Types: Cigarettes    Quit date: 82    Years since quitting: 59.4   Smokeless tobacco: Never   Tobacco  comments:    quit age 60  Vaping Use   Vaping Use: Never used  Substance and Sexual Activity   Alcohol use: No    Alcohol/week: 0.0 standard drinks of alcohol   Drug use: Not Currently   Sexual activity: Not Currently    Birth control/protection: Surgical    Comment: HYST-1st intercourse 89 yo-Fewer than 5 partners  Other Topics Concern   Not on file  Social History Narrative   Diet: very good      Caffeine: 1 1/2 cup coffee (usually decaf)      Married, if yes what year: Widow, 1954      Do you live in a house, apartment, assisted living, condo, trailer, ect: [place], independent living      Is it one or more stories: 2nd level. elevate      How many persons live in your home? 1      Pets: No      Highest level or education completed: 14      Current/Past profession: Health and safety inspector      Exercise:  Yes                Type and how often: aerobics twice daily         Living Will: Yes   DNR: Yes   POA/HPOA: Yes      Functional Status:   Do you have difficulty bathing or dressing yourself? No   Do you have difficulty preparing food or eating? No   Do you have difficulty managing your medications? No   Do you have difficulty managing your finances? No   Do you have difficulty affording your medications? No   Social Determinants of Health   Financial Resource Strain: Not on file  Food Insecurity: No Food Insecurity (04/08/2022)   Hunger Vital Sign    Worried About Running Out of Food in the Last Year: Never true    Ran Out of Food in the Last Year: Never true  Transportation Needs: No Transportation Needs (04/08/2022)   PRAPARE - Administrator, Civil Service (Medical): No    Lack of Transportation (Non-Medical): No  Physical Activity: Not on file  Stress: Not on file  Social Connections: Not on file  Intimate Partner Violence: Not At Risk (04/08/2022)   Humiliation, Afraid, Rape, and Kick questionnaire    Fear of Current or Ex-Partner:  No    Emotionally Abused: No    Physically Abused: No    Sexually Abused: No    FAMILY HISTORY: Family History  Problem Relation Age of Onset   Hypertension Mother    Stroke Mother    Heart attack Father 2   Heart failure Father  Heart failure Sister    Aortic aneurysm Sister    Aortic aneurysm Sister    Stroke Sister    Pneumonia Brother    Stroke Brother    ALS Other    Hyperlipidemia Daughter    Aortic aneurysm Son    Breast cancer Neg Hx     ALLERGIES:  is allergic to cephalexin, shellfish-derived products, clindamycin/lincomycin, lincomycin, clindamycin hcl, fosfomycin, levofloxacin, penicillamine, sugar-protein-starch, tape, ciprofloxacin, sulfa antibiotics, and sulfites.  MEDICATIONS:  Current Outpatient Medications  Medication Sig Dispense Refill   albuterol (PROVENTIL) (2.5 MG/3ML) 0.083% nebulizer solution Take 3 mLs (2.5 mg total) by nebulization every 4 (four) hours as needed for wheezing or shortness of breath. (Patient not taking: Reported on 07/14/2022) 126 mL 0   allopurinol (ZYLOPRIM) 100 MG tablet Take 50 mg by mouth in the morning.     amoxicillin (AMOXIL) 500 MG capsule Take 2,000 mg by mouth See admin instructions. Take 2,000 mg by mouth one hour prior to dental procedures     aspirin 81 MG tablet Take 81 mg by mouth at bedtime. (Patient not taking: Reported on 07/14/2022)     B Complex Vitamins (VITAMIN B COMPLEX) TABS Take 1 tablet by mouth daily with breakfast.     carvedilol (COREG) 12.5 MG tablet Take 1 tablet (12.5 mg total) by mouth 2 (two) times daily with a meal.  2   Cholecalciferol (VITAMIN D3) 25 MCG (1000 UT) CAPS Take 1,000 Units by mouth daily.     diltiazem (CARDIZEM CD) 240 MG 24 hr capsule Take 1 capsule (240 mg total) by mouth daily. 90 capsule 3   FEROSUL 325 (65 Fe) MG tablet Take 650 mg by mouth daily with breakfast.     furosemide (LASIX) 20 MG tablet Take 1 tablet (20 mg total) by mouth every other day. 45 tablet 3   hydrOXYzine  (ATARAX) 25 MG tablet Take 25 mg by mouth 3 (three) times daily.     LORazepam (ATIVAN) 0.5 MG tablet Take 1 tablet (0.5 mg total) by mouth every 4 (four) hours as needed for anxiety. May crush, mix with water and give sublingually if needed. 42 tablet 0   losartan (COZAAR) 25 MG tablet Take 1 tablet (25 mg total) by mouth daily. 30 tablet 2   MITIGARE 0.6 MG CAPS Take 0.6 mg by mouth in the morning.     morphine (ROXANOL) 20 MG/ML concentrated solution Take 0.5 mLs (10 mg total) by mouth every 4 (four) hours as needed for severe pain, shortness of breath or anxiety. May give sublingually if needed. (Patient not taking: Reported on 07/14/2022) 30 mL 0   Multiple Vitamins-Minerals (SYSTANE ICAPS AREDS2 PO) Take 1 capsule by mouth in the morning and at bedtime.     NONFORMULARY OR COMPOUNDED ITEM Apply 1 application  topically See admin instructions. Triamcinolone 0.1% cream/Cetaphil compounded cream- Apply to the back, legs, and arms 2 times a day as directed     potassium chloride (KLOR-CON M) 10 MEQ tablet Take 1 tablet (10 mEq total) by mouth daily for 15 days. 15 tablet 0   sodium chloride 1 g tablet Take 1 g by mouth daily.     triamcinolone cream (KENALOG) 0.1 % Apply topically 3 (three) times daily. 30 g 0   TYLENOL 500 MG tablet Take 500-1,000 mg by mouth every 6 (six) hours as needed for mild pain or headache.     No current facility-administered medications for this visit.    REVIEW OF SYSTEMS:   .  10 Point review of Systems was done is negative except as noted above.  PHYSICAL EXAMINATION: ECOG PERFORMANCE STATUS: 0 - Asymptomatic  .Pulse 81   Temp 98.1 F (36.7 C) (Temporal)   Resp 20   Ht 5\' 3"  (1.6 m)   Wt 133 lb 14.4 oz (60.7 kg)   SpO2 99%   BMI 23.72 kg/m   Exam was given in a chair.  NAD GENERAL:alert, in no acute distress and comfortable SKIN: no acute rashes, no significant lesions EYES: conjunctiva are pink and non-injected, sclera anicteric NECK: supple, no  JVD LYMPH:  no palpable lymphadenopathy in the cervical, axillary or inguinal regions LUNGS: clear to auscultation b/l with normal respiratory effort HEART: regular rate & rhythm ABDOMEN:  normoactive bowel sounds , non tender, not distended. Extremity: no pedal edema PSYCH: alert & oriented x 3 with fluent speech NEURO: no focal motor/sensory deficits  LABORATORY DATA:   I have reviewed the data as listed  .    Latest Ref Rng & Units 08/02/2022    1:21 PM 06/12/2022    3:56 PM 04/12/2022    5:33 AM  CBC  WBC 4.0 - 10.5 K/uL 9.8  6.3  11.7   Hemoglobin 12.0 - 15.0 g/dL 9.3  16.1  8.7   Hematocrit 36.0 - 46.0 % 29.3  40.9  27.3   Platelets 150 - 400 K/uL 278  293  457    . CBC    Component Value Date/Time   WBC 9.8 08/02/2022 1321   WBC 6.3 06/12/2022 1556   RBC 3.40 (L) 08/02/2022 1321   HGB 9.3 (L) 08/02/2022 1321   HGB 10.8 (L) 12/26/2007 1539   HCT 29.3 (L) 08/02/2022 1321   HCT 28.2 (L) 02/17/2021 1001   HCT 33.4 (L) 12/26/2007 1539   PLT 278 08/02/2022 1321   PLT 314 12/26/2007 1539   MCV 86.2 08/02/2022 1321   MCV 88.5 12/26/2007 1539   MCH 27.4 08/02/2022 1321   MCHC 31.7 08/02/2022 1321   RDW 17.6 (H) 08/02/2022 1321   RDW 15.3 (H) 12/26/2007 1539   LYMPHSABS 1.9 08/02/2022 1321   LYMPHSABS 2.2 12/26/2007 1539   MONOABS 1.0 08/02/2022 1321   MONOABS 0.8 12/26/2007 1539   EOSABS 0.7 (H) 08/02/2022 1321   EOSABS 0.6 (H) 12/26/2007 1539   BASOSABS 0.2 (H) 08/02/2022 1321   BASOSABS 0.1 12/26/2007 1539     .    Latest Ref Rng & Units 08/02/2022    1:21 PM 06/12/2022    3:56 PM 04/12/2022    5:33 AM  CMP  Glucose 70 - 99 mg/dL 096  045  98   BUN 8 - 23 mg/dL 44  36  36   Creatinine 0.44 - 1.00 mg/dL 4.09  8.11  9.14   Sodium 135 - 145 mmol/L 134  130  131   Potassium 3.5 - 5.1 mmol/L 5.1  4.7  4.1   Chloride 98 - 111 mmol/L 100  95  95   CO2 22 - 32 mmol/L 26  23  24    Calcium 8.9 - 10.3 mg/dL 9.3  9.1  8.5   Total Protein 6.5 - 8.1 g/dL 7.6  7.3     Total Bilirubin 0.3 - 1.2 mg/dL 0.3  0.3    Alkaline Phos 38 - 126 U/L 71  76    AST 15 - 41 U/L 26  30    ALT 0 - 44 U/L 19  19     Component  Latest Ref Rng & Units 03/06/2020  Iron     41 - 142 ug/dL 35 (L)  TIBC     161 - 444 ug/dL 096 (H)  Saturation Ratios     21 - 57 % 8 (L)  UIBC     120 - 384 ug/dL 045 (H)  Folate, Hemolysate     Not Estab. ng/mL 605.0  HCT     34.0 - 46.6 % 29.6 (L)  Folate, RBC     >498 ng/mL 2,044  Erythropoietin     2.6 - 18.5 mIU/mL 14.9  RA Latex Turbid.     <14.0 IU/mL 99.5 (H)  Vitamin B12     180 - 914 pg/mL 841  Ferritin     11 - 307 ng/mL 35  Sed Rate     0 - 22 mm/hr 22   . Lab Results  Component Value Date   IRON 40 08/02/2022   TIBC 305 08/02/2022   IRONPCTSAT 13 08/02/2022   (Iron and TIBC)  Lab Results  Component Value Date   FERRITIN 217 08/02/2022     RADIOGRAPHIC STUDIES: I have personally reviewed the radiological images as listed and agreed with the findings in the report. No results found.  ASSESSMENT & PLAN:   89 yo with   1) Chronic normocytic anemia Due to CKD + iron deficiency + anemia of chronic inflammation from possible RA 2) RA -not currently on treatment  PLAN: -Discussed lab results from today, 08/02/2022, with the patient. CBC shows decreased hemoglobin at 9.3 g/dL and decreased hematocrit at 29.3%. CMP is stable  Iron labs show ferritin of 217 and iron saturation of 13% - no indication for IV iron at this time. -Discussed the option of ESA's injection once a month if iron levels are stable and patient still gets anemic wth hgb<9 -Answered all of patient's questions.  -Continue vitamin B complex 1 capsule p.o. daily.   FOLLOW UP: RTC with Dr Candise Che with labs in 3 months  The total time spent in the appointment was 20 minutes* .  All of the patient's questions were answered with apparent satisfaction. The patient knows to call the clinic with any problems, questions or  concerns.   Wyvonnia Lora MD MS AAHIVMS [facility] Hematology/Oncology Physician [facility]  .*Total Encounter Time as defined by the Centers for Medicare and Medicaid Services includes, in addition to the face-to-face time of a patient visit (documented in the note above) non-face-to-face time: obtaining and reviewing outside history, ordering and reviewing medications, tests or procedures, care coordination (communications with other health care professionals or caregivers) and documentation in the medical record.   I, Ok Edwards, am acting as a Neurosurgeon for Wyvonnia Lora, MD.  .I have reviewed the above documentation for accuracy and completeness, and I agree with the above. Johney Maine MD

## 2022-08-04 ENCOUNTER — Encounter: Payer: Self-pay | Admitting: Hematology

## 2022-08-08 ENCOUNTER — Encounter: Payer: Self-pay | Admitting: Hematology

## 2022-08-10 ENCOUNTER — Telehealth: Payer: Self-pay | Admitting: Hematology

## 2022-08-10 DIAGNOSIS — R296 Repeated falls: Secondary | ICD-10-CM | POA: Diagnosis not present

## 2022-08-10 DIAGNOSIS — M6259 Muscle wasting and atrophy, not elsewhere classified, multiple sites: Secondary | ICD-10-CM | POA: Diagnosis not present

## 2022-08-10 DIAGNOSIS — R2689 Other abnormalities of gait and mobility: Secondary | ICD-10-CM | POA: Diagnosis not present

## 2022-08-11 ENCOUNTER — Encounter: Payer: Self-pay | Admitting: Hematology

## 2022-08-12 DIAGNOSIS — R296 Repeated falls: Secondary | ICD-10-CM | POA: Diagnosis not present

## 2022-08-12 DIAGNOSIS — R2689 Other abnormalities of gait and mobility: Secondary | ICD-10-CM | POA: Diagnosis not present

## 2022-08-12 DIAGNOSIS — M6259 Muscle wasting and atrophy, not elsewhere classified, multiple sites: Secondary | ICD-10-CM | POA: Diagnosis not present

## 2022-08-16 DIAGNOSIS — M4726 Other spondylosis with radiculopathy, lumbar region: Secondary | ICD-10-CM | POA: Diagnosis not present

## 2022-08-16 DIAGNOSIS — M9902 Segmental and somatic dysfunction of thoracic region: Secondary | ICD-10-CM | POA: Diagnosis not present

## 2022-08-16 DIAGNOSIS — M9903 Segmental and somatic dysfunction of lumbar region: Secondary | ICD-10-CM | POA: Diagnosis not present

## 2022-08-16 DIAGNOSIS — M9901 Segmental and somatic dysfunction of cervical region: Secondary | ICD-10-CM | POA: Diagnosis not present

## 2022-08-17 DIAGNOSIS — R2689 Other abnormalities of gait and mobility: Secondary | ICD-10-CM | POA: Diagnosis not present

## 2022-08-17 DIAGNOSIS — M6259 Muscle wasting and atrophy, not elsewhere classified, multiple sites: Secondary | ICD-10-CM | POA: Diagnosis not present

## 2022-08-17 DIAGNOSIS — R296 Repeated falls: Secondary | ICD-10-CM | POA: Diagnosis not present

## 2022-08-18 DIAGNOSIS — Z79899 Other long term (current) drug therapy: Secondary | ICD-10-CM | POA: Diagnosis not present

## 2022-08-18 DIAGNOSIS — M1991 Primary osteoarthritis, unspecified site: Secondary | ICD-10-CM | POA: Diagnosis not present

## 2022-08-18 DIAGNOSIS — L409 Psoriasis, unspecified: Secondary | ICD-10-CM | POA: Diagnosis not present

## 2022-08-18 DIAGNOSIS — Z6823 Body mass index (BMI) 23.0-23.9, adult: Secondary | ICD-10-CM | POA: Diagnosis not present

## 2022-08-18 DIAGNOSIS — M1009 Idiopathic gout, multiple sites: Secondary | ICD-10-CM | POA: Diagnosis not present

## 2022-08-18 DIAGNOSIS — R768 Other specified abnormal immunological findings in serum: Secondary | ICD-10-CM | POA: Diagnosis not present

## 2022-08-19 DIAGNOSIS — M6259 Muscle wasting and atrophy, not elsewhere classified, multiple sites: Secondary | ICD-10-CM | POA: Diagnosis not present

## 2022-08-19 DIAGNOSIS — R296 Repeated falls: Secondary | ICD-10-CM | POA: Diagnosis not present

## 2022-08-19 DIAGNOSIS — R2689 Other abnormalities of gait and mobility: Secondary | ICD-10-CM | POA: Diagnosis not present

## 2022-08-23 ENCOUNTER — Ambulatory Visit
Admission: RE | Admit: 2022-08-23 | Discharge: 2022-08-23 | Disposition: A | Discharge status: Home / Self Care | Payer: Medicare Other | Source: Ambulatory Visit | Attending: Physician Assistant | Admitting: Physician Assistant

## 2022-08-23 ENCOUNTER — Other Ambulatory Visit: Payer: Self-pay | Admitting: Physician Assistant

## 2022-08-23 DIAGNOSIS — R0989 Other specified symptoms and signs involving the circulatory and respiratory systems: Secondary | ICD-10-CM

## 2022-08-23 DIAGNOSIS — R5383 Other fatigue: Secondary | ICD-10-CM | POA: Diagnosis not present

## 2022-08-23 DIAGNOSIS — D509 Iron deficiency anemia, unspecified: Secondary | ICD-10-CM | POA: Diagnosis not present

## 2022-08-23 DIAGNOSIS — R0602 Shortness of breath: Secondary | ICD-10-CM | POA: Diagnosis not present

## 2022-08-23 DIAGNOSIS — R1030 Lower abdominal pain, unspecified: Secondary | ICD-10-CM | POA: Diagnosis not present

## 2022-08-26 DIAGNOSIS — R296 Repeated falls: Secondary | ICD-10-CM | POA: Diagnosis not present

## 2022-08-26 DIAGNOSIS — R2689 Other abnormalities of gait and mobility: Secondary | ICD-10-CM | POA: Diagnosis not present

## 2022-08-26 DIAGNOSIS — M6259 Muscle wasting and atrophy, not elsewhere classified, multiple sites: Secondary | ICD-10-CM | POA: Diagnosis not present

## 2022-08-31 DIAGNOSIS — R296 Repeated falls: Secondary | ICD-10-CM | POA: Diagnosis not present

## 2022-08-31 DIAGNOSIS — M6259 Muscle wasting and atrophy, not elsewhere classified, multiple sites: Secondary | ICD-10-CM | POA: Diagnosis not present

## 2022-08-31 DIAGNOSIS — R2689 Other abnormalities of gait and mobility: Secondary | ICD-10-CM | POA: Diagnosis not present

## 2022-09-02 DIAGNOSIS — M6259 Muscle wasting and atrophy, not elsewhere classified, multiple sites: Secondary | ICD-10-CM | POA: Diagnosis not present

## 2022-09-02 DIAGNOSIS — R2689 Other abnormalities of gait and mobility: Secondary | ICD-10-CM | POA: Diagnosis not present

## 2022-09-02 DIAGNOSIS — R296 Repeated falls: Secondary | ICD-10-CM | POA: Diagnosis not present

## 2022-09-06 ENCOUNTER — Telehealth: Payer: Self-pay | Admitting: Hematology

## 2022-09-08 ENCOUNTER — Ambulatory Visit (INDEPENDENT_AMBULATORY_CARE_PROVIDER_SITE_OTHER): Payer: Medicare Other | Admitting: Podiatry

## 2022-09-08 ENCOUNTER — Encounter: Payer: Self-pay | Admitting: Podiatry

## 2022-09-08 DIAGNOSIS — M79675 Pain in left toe(s): Secondary | ICD-10-CM | POA: Diagnosis not present

## 2022-09-08 DIAGNOSIS — B351 Tinea unguium: Secondary | ICD-10-CM

## 2022-09-08 DIAGNOSIS — L309 Dermatitis, unspecified: Secondary | ICD-10-CM | POA: Diagnosis not present

## 2022-09-08 DIAGNOSIS — L282 Other prurigo: Secondary | ICD-10-CM | POA: Diagnosis not present

## 2022-09-08 DIAGNOSIS — Q828 Other specified congenital malformations of skin: Secondary | ICD-10-CM

## 2022-09-08 DIAGNOSIS — M79674 Pain in right toe(s): Secondary | ICD-10-CM | POA: Diagnosis not present

## 2022-09-08 DIAGNOSIS — N1831 Chronic kidney disease, stage 3a: Secondary | ICD-10-CM | POA: Diagnosis not present

## 2022-09-08 DIAGNOSIS — Z85828 Personal history of other malignant neoplasm of skin: Secondary | ICD-10-CM | POA: Diagnosis not present

## 2022-09-08 DIAGNOSIS — L280 Lichen simplex chronicus: Secondary | ICD-10-CM | POA: Diagnosis not present

## 2022-09-08 NOTE — Progress Notes (Signed)

## 2022-09-09 DIAGNOSIS — M6259 Muscle wasting and atrophy, not elsewhere classified, multiple sites: Secondary | ICD-10-CM | POA: Diagnosis not present

## 2022-09-09 DIAGNOSIS — R2689 Other abnormalities of gait and mobility: Secondary | ICD-10-CM | POA: Diagnosis not present

## 2022-09-09 DIAGNOSIS — R296 Repeated falls: Secondary | ICD-10-CM | POA: Diagnosis not present

## 2022-09-10 ENCOUNTER — Emergency Department (HOSPITAL_COMMUNITY)
Admission: EM | Admit: 2022-09-10 | Discharge: 2022-09-10 | Disposition: A | Discharge status: Home / Self Care | Payer: Medicare Other | Attending: Emergency Medicine | Admitting: Emergency Medicine

## 2022-09-10 ENCOUNTER — Encounter (HOSPITAL_COMMUNITY): Payer: Self-pay

## 2022-09-10 ENCOUNTER — Emergency Department (HOSPITAL_COMMUNITY): Payer: Medicare Other

## 2022-09-10 ENCOUNTER — Other Ambulatory Visit: Payer: Self-pay

## 2022-09-10 DIAGNOSIS — W1811XA Fall from or off toilet without subsequent striking against object, initial encounter: Secondary | ICD-10-CM | POA: Diagnosis not present

## 2022-09-10 DIAGNOSIS — I10 Essential (primary) hypertension: Secondary | ICD-10-CM | POA: Diagnosis not present

## 2022-09-10 DIAGNOSIS — R29898 Other symptoms and signs involving the musculoskeletal system: Secondary | ICD-10-CM | POA: Diagnosis not present

## 2022-09-10 DIAGNOSIS — S73005A Unspecified dislocation of left hip, initial encounter: Secondary | ICD-10-CM | POA: Insufficient documentation

## 2022-09-10 DIAGNOSIS — T84021A Dislocation of internal left hip prosthesis, initial encounter: Secondary | ICD-10-CM | POA: Diagnosis not present

## 2022-09-10 DIAGNOSIS — I959 Hypotension, unspecified: Secondary | ICD-10-CM | POA: Diagnosis not present

## 2022-09-10 DIAGNOSIS — Y92002 Bathroom of unspecified non-institutional (private) residence single-family (private) house as the place of occurrence of the external cause: Secondary | ICD-10-CM | POA: Insufficient documentation

## 2022-09-10 DIAGNOSIS — Z471 Aftercare following joint replacement surgery: Secondary | ICD-10-CM | POA: Diagnosis not present

## 2022-09-10 DIAGNOSIS — Z7401 Bed confinement status: Secondary | ICD-10-CM | POA: Diagnosis not present

## 2022-09-10 DIAGNOSIS — R58 Hemorrhage, not elsewhere classified: Secondary | ICD-10-CM | POA: Diagnosis not present

## 2022-09-10 DIAGNOSIS — Z7982 Long term (current) use of aspirin: Secondary | ICD-10-CM | POA: Diagnosis not present

## 2022-09-10 DIAGNOSIS — Z96642 Presence of left artificial hip joint: Secondary | ICD-10-CM | POA: Diagnosis not present

## 2022-09-10 DIAGNOSIS — M25552 Pain in left hip: Secondary | ICD-10-CM | POA: Diagnosis not present

## 2022-09-10 DIAGNOSIS — S79912A Unspecified injury of left hip, initial encounter: Secondary | ICD-10-CM | POA: Diagnosis present

## 2022-09-10 MED ORDER — PROPOFOL 10 MG/ML IV BOLUS
1.0000 mg/kg | Freq: Once | INTRAVENOUS | Status: DC
  Filled 2022-09-10: qty 20

## 2022-09-10 MED ORDER — DIPHENHYDRAMINE HCL 50 MG/ML IJ SOLN
25.0000 mg | Freq: Once | INTRAMUSCULAR | Status: AC
  Administered 2022-09-10: 25 mg via INTRAVENOUS
  Filled 2022-09-10: qty 1

## 2022-09-10 MED ORDER — PROPOFOL 10 MG/ML IV BOLUS
INTRAVENOUS | Status: AC | PRN
  Administered 2022-09-10: 60 mg via INTRAVENOUS
  Administered 2022-09-10: 30 mg via INTRAVENOUS
  Administered 2022-09-10: 20 mg via INTRAVENOUS
  Administered 2022-09-10: 50 mg via INTRAVENOUS

## 2022-09-10 MED ORDER — FENTANYL CITRATE PF 50 MCG/ML IJ SOSY
50.0000 ug | PREFILLED_SYRINGE | Freq: Once | INTRAMUSCULAR | Status: AC
  Administered 2022-09-10: 50 ug via INTRAVENOUS
  Filled 2022-09-10: qty 1

## 2022-09-10 NOTE — ED Notes (Signed)
[facility] called  

## 2022-09-10 NOTE — ED Provider Notes (Signed)
Hip reduction performed by me per Dr. Brandy Hale request  .Ortho Injury Treatment  Date/Time: 09/10/2022 12:29 PM  Performed by: Fayrene Helper, PA-C Authorized by: Fayrene Helper, PA-C   Consent:    Consent obtained:  Written   Consent given by:  Patient   Risks discussed:  Recurrent dislocation   Alternatives discussed:  ReferralInjury location: hip Location details: left hip Injury type: dislocation Dislocation type: anterior Spontaneous dislocation: yes Prosthesis: yes Pre-procedure neurovascular assessment: neurovascularly intact Pre-procedure distal perfusion: normal Pre-procedure neurological function: normal Pre-procedure range of motion: reduced  Anesthesia: Local anesthesia used: no  Patient sedated: Yes. Refer to sedation procedure documentation for details of sedation. Manipulation performed: yes Reduction method: traction and counter traction Reduction successful: yes X-ray confirmed reduction: yes Immobilization: knee immobilizer. Post-procedure neurovascular assessment: post-procedure neurovascularly intact Post-procedure distal perfusion: normal Post-procedure neurological function: normal Post-procedure range of motion: improved       Fayrene Helper, PA-C 09/10/22 1230    Derwood Kaplan, MD 09/10/22 1409

## 2022-09-10 NOTE — Discharge Instructions (Addendum)
Your hip has been reduced in the emergency room.  Please follow-up with your orthopedic doctor for repeat evaluation.  Keep your leg in the immobilizer.  Take Tylenol for pain control.

## 2022-09-10 NOTE — ED Triage Notes (Signed)
Pt is coming from [facility]. Pt was sitting on the toilet when she felt a pop in her left hip. Hx of hip replacement and dislocation. EMS noted small laceration on right hip, denies fall. Given of Fentanyl by EMS, last given at 0940.

## 2022-09-10 NOTE — ED Provider Notes (Signed)
[place] EMERGENCY DEPARTMENT AT [facility] Provider Note   CSN: [id number] Arrival date & time: 09/10/22  4132     History  Chief Complaint  Patient presents with   Hip Pain         [name] is a 89 y.o. female.  HPI    89 year old female comes in with chief complaint of hip dislocation.  Patient resides at independent living facility.  She has history of previous hip replacement surgery and also hip dislocation on the same side.  Patient indicates that she was sitting on the commode when the hip has popped out.  She is having significant pain.  EMS gave her 200 mcg of fentanyl.  N.p.o. since last night.  Home Medications Prior to Admission medications   Medication Sig Start Date End Date Taking? Authorizing Provider  albuterol (PROVENTIL) (2.5 MG/3ML) 0.083% nebulizer solution Take 3 mLs (2.5 mg total) by nebulization every 4 (four) hours as needed for wheezing or shortness of breath. Patient not taking: Reported on 07/14/2022 06/14/22   Dorcas Carrow, MD  allopurinol (ZYLOPRIM) 100 MG tablet Take 50 mg by mouth in the morning.    [provider]  amoxicillin (AMOXIL) 500 MG capsule Take 2,000 mg by mouth See admin instructions. Take 2,000 mg by mouth one hour prior to dental procedures    [provider]  aspirin 81 MG tablet Take 81 mg by mouth at bedtime. Patient not taking: Reported on 07/14/2022    [provider]  B Complex Vitamins (VITAMIN B COMPLEX) TABS Take 1 tablet by mouth daily with breakfast.    [provider]  carvedilol (COREG) 12.5 MG tablet Take 1 tablet (12.5 mg total) by mouth 2 (two) times daily with a meal. 04/12/22 07/11/22  Pokhrel, Laxman, MD  Cholecalciferol (VITAMIN D3) 25 MCG (1000 UT) CAPS Take 1,000 Units by mouth daily.    [provider]  diltiazem (CARDIZEM CD) 240 MG 24 hr capsule Take 1 capsule (240 mg total) by mouth daily. 10/27/21   Lyn Records, MD  FEROSUL 325 (65 Fe) MG  tablet Take 650 mg by mouth daily with breakfast.    [provider]  furosemide (LASIX) 20 MG tablet Take 1 tablet (20 mg total) by mouth every other day. 04/11/22 04/11/23  Pokhrel, Rebekah Chesterfield, MD  hydrOXYzine (ATARAX) 25 MG tablet Take 25 mg by mouth 3 (three) times daily. 07/07/22   [provider]  LORazepam (ATIVAN) 0.5 MG tablet Take 1 tablet (0.5 mg total) by mouth every 4 (four) hours as needed for anxiety. May crush, mix with water and give sublingually if needed. 06/14/22   Dorcas Carrow, MD  losartan (COZAAR) 25 MG tablet Take 1 tablet (25 mg total) by mouth daily. 04/11/22 07/10/22  Pokhrel, Laxman, MD  MITIGARE 0.6 MG CAPS Take 0.6 mg by mouth in the morning.    [provider]  morphine (ROXANOL) 20 MG/ML concentrated solution Take 0.5 mLs (10 mg total) by mouth every 4 (four) hours as needed for severe pain, shortness of breath or anxiety. May give sublingually if needed. Patient not taking: Reported on 07/14/2022 06/14/22   Dorcas Carrow, MD  Multiple Vitamins-Minerals (SYSTANE ICAPS AREDS2 PO) Take 1 capsule by mouth in the morning and at bedtime.    [provider]  NONFORMULARY OR COMPOUNDED ITEM Apply 1 application  topically See admin instructions. Triamcinolone 0.1% cream/Cetaphil compounded cream- Apply to the back, legs, and arms 2 times a day as directed  [provider]  potassium chloride (KLOR-CON M) 10 MEQ tablet Take 1 tablet (10 mEq total) by mouth daily for 15 days. 04/11/22 04/26/22  Pokhrel, Rebekah Chesterfield, MD  sodium chloride 1 g tablet Take 1 g by mouth daily. 08/21/19   [provider]  triamcinolone cream (KENALOG) 0.1 % Apply topically 3 (three) times daily. 06/14/22   Dorcas Carrow, MD  TYLENOL 500 MG tablet Take 500-1,000 mg by mouth every 6 (six) hours as needed for mild pain or headache.    [provider]      Allergies    Cephalexin, Shellfish-derived products, Clindamycin/lincomycin, Lincomycin, Clindamycin hcl,  Fosfomycin, Levofloxacin, Penicillamine, Sugar-protein-starch, Tape, Ciprofloxacin, Sulfa antibiotics, and Sulfites    Review of Systems   Review of Systems  All other systems reviewed and are negative.   Physical Exam Updated Vital Signs BP (!) 141/83   Pulse 88   Temp 97.8 F (36.6 C) (Oral)   Resp 14   Wt 60 kg   SpO2 100%   BMI 23.43 kg/m  Physical Exam Vitals and nursing note reviewed.  Constitutional:      Appearance: She is well-developed.  HENT:     Head: Atraumatic.  Cardiovascular:     Rate and Rhythm: Normal rate.  Pulmonary:     Effort: Pulmonary effort is normal.  Musculoskeletal:        General: Swelling, tenderness and deformity present.     Cervical back: Normal range of motion.     Comments: Internal rotation of the L hip, tenderness to palpation  Skin:    General: Skin is warm and dry.  Neurological:     Mental Status: She is alert and oriented to person, place, and time.     ED Results / Procedures / Treatments   Labs (all labs ordered are listed, but only abnormal results are displayed) Labs Reviewed - No data to display  EKG None  Radiology DG Hip Unilat W or Wo Pelvis 2-3 Views Left  Result Date: 09/10/2022 CLINICAL DATA:  Post reduction of left hip dislocation. EXAM: DG HIP (WITH OR WITHOUT PELVIS) 2-3V LEFT COMPARISON:  Radiographs earlier the same date and 01/22/2017. FINDINGS: Interval reduction of the dislocated left total hip arthroplasty. No evidence of acute fracture. Stable mild right hip degenerative changes. Stable lumbar spondylosis associated with a convex right scoliosis. No significant soft tissue abnormalities are identified. IMPRESSION: Interval reduction of the dislocated left total hip arthroplasty. No evidence of acute fracture. Electronically Signed   By: Carey Bullocks M.D.   On: 09/10/2022 12:48   DG Hip Unilat W or Wo Pelvis 2-3 Views Left  Result Date: 09/10/2022 CLINICAL DATA:  Left hip dislocation. EXAM: DG HIP  (WITH OR WITHOUT PELVIS) 2-3V LEFT COMPARISON:  None Available. FINDINGS: Status post left total hip arthroplasty. There is superior dislocation of the femoral component. No definite fracture is noted IMPRESSION: Superior dislocation of left total hip arthroplasty. Electronically Signed   By: Lupita Raider M.D.   On: 09/10/2022 11:30    Procedures Reduction of dislocation  Date/Time: 09/10/2022 2:28 PM  Performed by: Derwood Kaplan, MD Authorized by: Derwood Kaplan, MD  Consent: Written consent obtained. Risks and benefits: risks, benefits and alternatives were discussed Consent given by: patient Patient understanding: patient states understanding of the procedure being performed Patient consent: the patient's understanding of the procedure matches consent given Procedure consent: procedure consent matches procedure scheduled Relevant documents: relevant documents present and verified Test results: test results available and properly  labeled Site marked: the operative site was marked Imaging studies: imaging studies available Patient identity confirmed: arm band Time out: Immediately prior to procedure a "time out" was called to verify the correct patient, procedure, equipment, support staff and site/side marked as required. Local anesthesia used: no  Anesthesia: Local anesthesia used: no  Sedation: Patient sedated: yes Sedatives: propofol Sedation start date/time: 09/10/2022 11:40 AM Sedation end date/time: 09/10/2022 12:10 PM Vitals: Vital signs were monitored during sedation.  Patient tolerance: patient tolerated the procedure well with no immediate complications Comments: Failed attempt at reduction   .Sedation  Date/Time: 09/10/2022 12:20 PM  Performed by: Derwood Kaplan, MD Authorized by: Derwood Kaplan, MD   Consent:    Consent obtained:  Written   Consent given by:  Patient   Risks discussed:  Allergic reaction, prolonged hypoxia resulting in organ damage,  prolonged sedation necessitating reversal, dysrhythmia, respiratory compromise necessitating ventilatory assistance and intubation, inadequate sedation, nausea and vomiting Universal protocol:    Procedure explained and questions answered to patient or proxy's satisfaction: yes     Relevant documents present and verified: yes     Test results available: yes     Imaging studies available: yes     Required blood products, implants, devices, and special equipment available: yes     Site/side marked: yes     Immediately prior to procedure, a time out was called: yes     Patient identity confirmed:  Arm band Indications:    Procedure performed:  Dislocation reduction   Procedure necessitating sedation performed by:  Physician performing sedation Pre-sedation assessment:    Time since last food or drink:  > 12 hours   ASA classification: class 3 - patient with severe systemic disease     Mouth opening:  3 or more finger widths   Thyromental distance:  3 finger widths   Mallampati score:  III - soft palate, base of uvula visible   Neck mobility: normal     Pre-sedation assessments completed and reviewed: airway patency, mental status, nausea/vomiting and respiratory function     Pre-sedation assessments completed and reviewed: pre-procedure cardiovascular function not reviewed     Pre-sedation assessment completed:  09/10/2022 11:32 AM Immediate pre-procedure details:    Reassessment: Patient reassessed immediately prior to procedure     Reviewed: vital signs, relevant labs/tests and NPO status     Verified: bag valve mask available, emergency equipment available, intubation equipment available, IV patency confirmed and oxygen available   Procedure details (see MAR for exact dosages):    Preoxygenation:  Nasal cannula   Sedation:  Propofol   Intended level of sedation: deep   Intra-procedure monitoring:  Blood pressure monitoring, continuous capnometry, frequent LOC assessments, continuous  pulse oximetry, frequent vital sign checks and cardiac monitor   Intra-procedure events: none     Total Provider sedation time (minutes):  12 Post-procedure details:    Post-sedation assessment completed:  09/10/2022 2:33 PM   Attendance: Constant attendance by certified staff until patient recovered     Recovery: Patient returned to pre-procedure baseline     Post-sedation assessments completed and reviewed: airway patency, cardiovascular function, mental status, pain level and respiratory function     Procedure completion:  Tolerated well, no immediate complications Comments:     Repeat attempt at hip reduction was made and we succeeded. 60 mg of propofol given for the second attempt.     Medications Ordered in ED Medications  propofol (DIPRIVAN) 10 mg/mL bolus/IV push 60 mg (has no  administration in time range)  fentaNYL (SUBLIMAZE) injection 50 mcg (50 mcg Intravenous Given 09/10/22 1047)  propofol (DIPRIVAN) 10 mg/mL bolus/IV push (60 mg Intravenous Given 09/10/22 1218)  diphenhydrAMINE (BENADRYL) injection 25 mg (25 mg Intravenous Given 09/10/22 1359)    ED Course/ Medical Decision Making/ A&P                             Medical Decision Making Amount and/or Complexity of Data Reviewed Radiology: ordered.  Risk Prescription drug management.   89 year old female comes in with chief complaint of hip pain.  She is complaining of pain to the left hip, which is internally rotated and shortened.  She has previous history of hip dislocation.  I have reviewed patient's previous workup including 2018 visit when she had hip reduction done in the ER.  X-ray of the hip ordered, it was independently interpreted.  There is no evidence of fracture, dislocation noted.  On attempt #1, we were not able to successfully reduce the hip.  She received propofol 50 followed by 20, 20, 30 mg at that time.  It was evident that we needed to change the strategy, provide sustained traction with  improved countertraction and focal manipulation of the hip socket.  That techniques exceeded.  I recruited our APP to proceed with primary traction, while I manipulated the hip proximally.  Patient reassessed afterwards.  Neurovascularly intact.  AOx3.  Stable for discharge.  She will follow-up with Dr. Merlyn Albert.  Final Clinical Impression(s) / ED Diagnoses Final diagnoses:  Dislocation of left hip, initial encounter [facility])    Rx / DC Orders ED Discharge Orders     None         Derwood Kaplan, MD 09/10/22 1436

## 2022-09-13 DIAGNOSIS — M6259 Muscle wasting and atrophy, not elsewhere classified, multiple sites: Secondary | ICD-10-CM | POA: Diagnosis not present

## 2022-09-13 DIAGNOSIS — R296 Repeated falls: Secondary | ICD-10-CM | POA: Diagnosis not present

## 2022-09-13 DIAGNOSIS — R2689 Other abnormalities of gait and mobility: Secondary | ICD-10-CM | POA: Diagnosis not present

## 2022-09-15 DIAGNOSIS — R531 Weakness: Secondary | ICD-10-CM | POA: Diagnosis not present

## 2022-09-15 DIAGNOSIS — Z515 Encounter for palliative care: Secondary | ICD-10-CM | POA: Diagnosis not present

## 2022-09-15 DIAGNOSIS — I5020 Unspecified systolic (congestive) heart failure: Secondary | ICD-10-CM | POA: Diagnosis not present

## 2022-09-15 DIAGNOSIS — R269 Unspecified abnormalities of gait and mobility: Secondary | ICD-10-CM | POA: Diagnosis not present

## 2022-09-17 DIAGNOSIS — E871 Hypo-osmolality and hyponatremia: Secondary | ICD-10-CM | POA: Diagnosis not present

## 2022-09-17 DIAGNOSIS — D638 Anemia in other chronic diseases classified elsewhere: Secondary | ICD-10-CM | POA: Diagnosis not present

## 2022-09-17 DIAGNOSIS — J849 Interstitial pulmonary disease, unspecified: Secondary | ICD-10-CM | POA: Diagnosis not present

## 2022-09-17 DIAGNOSIS — R0609 Other forms of dyspnea: Secondary | ICD-10-CM | POA: Diagnosis not present

## 2022-09-17 DIAGNOSIS — Z8739 Personal history of other diseases of the musculoskeletal system and connective tissue: Secondary | ICD-10-CM | POA: Diagnosis not present

## 2022-09-17 DIAGNOSIS — D509 Iron deficiency anemia, unspecified: Secondary | ICD-10-CM | POA: Diagnosis not present

## 2022-09-17 DIAGNOSIS — R5382 Chronic fatigue, unspecified: Secondary | ICD-10-CM | POA: Diagnosis not present

## 2022-09-20 DIAGNOSIS — M4726 Other spondylosis with radiculopathy, lumbar region: Secondary | ICD-10-CM | POA: Diagnosis not present

## 2022-09-20 DIAGNOSIS — M9901 Segmental and somatic dysfunction of cervical region: Secondary | ICD-10-CM | POA: Diagnosis not present

## 2022-09-20 DIAGNOSIS — M9903 Segmental and somatic dysfunction of lumbar region: Secondary | ICD-10-CM | POA: Diagnosis not present

## 2022-09-20 DIAGNOSIS — M9902 Segmental and somatic dysfunction of thoracic region: Secondary | ICD-10-CM | POA: Diagnosis not present

## 2022-09-22 DIAGNOSIS — Z96642 Presence of left artificial hip joint: Secondary | ICD-10-CM | POA: Diagnosis not present

## 2022-10-11 DIAGNOSIS — M9902 Segmental and somatic dysfunction of thoracic region: Secondary | ICD-10-CM | POA: Diagnosis not present

## 2022-10-11 DIAGNOSIS — R296 Repeated falls: Secondary | ICD-10-CM | POA: Diagnosis not present

## 2022-10-11 DIAGNOSIS — M4726 Other spondylosis with radiculopathy, lumbar region: Secondary | ICD-10-CM | POA: Diagnosis not present

## 2022-10-11 DIAGNOSIS — M6259 Muscle wasting and atrophy, not elsewhere classified, multiple sites: Secondary | ICD-10-CM | POA: Diagnosis not present

## 2022-10-11 DIAGNOSIS — R2689 Other abnormalities of gait and mobility: Secondary | ICD-10-CM | POA: Diagnosis not present

## 2022-10-11 DIAGNOSIS — M9901 Segmental and somatic dysfunction of cervical region: Secondary | ICD-10-CM | POA: Diagnosis not present

## 2022-10-11 DIAGNOSIS — M9903 Segmental and somatic dysfunction of lumbar region: Secondary | ICD-10-CM | POA: Diagnosis not present

## 2022-10-18 ENCOUNTER — Other Ambulatory Visit: Payer: Medicare Other

## 2022-10-18 ENCOUNTER — Ambulatory Visit: Payer: Medicare Other | Admitting: Hematology

## 2022-10-18 DIAGNOSIS — L2081 Atopic neurodermatitis: Secondary | ICD-10-CM | POA: Diagnosis not present

## 2022-10-18 DIAGNOSIS — L2089 Other atopic dermatitis: Secondary | ICD-10-CM | POA: Diagnosis not present

## 2022-10-18 DIAGNOSIS — L309 Dermatitis, unspecified: Secondary | ICD-10-CM | POA: Diagnosis not present

## 2022-10-18 DIAGNOSIS — L280 Lichen simplex chronicus: Secondary | ICD-10-CM | POA: Diagnosis not present

## 2022-10-18 DIAGNOSIS — Z85828 Personal history of other malignant neoplasm of skin: Secondary | ICD-10-CM | POA: Diagnosis not present

## 2022-10-19 DIAGNOSIS — M6259 Muscle wasting and atrophy, not elsewhere classified, multiple sites: Secondary | ICD-10-CM | POA: Diagnosis not present

## 2022-10-19 DIAGNOSIS — R2689 Other abnormalities of gait and mobility: Secondary | ICD-10-CM | POA: Diagnosis not present

## 2022-10-19 DIAGNOSIS — R296 Repeated falls: Secondary | ICD-10-CM | POA: Diagnosis not present

## 2022-10-19 NOTE — Progress Notes (Unsigned)
Cardiology Office Note:    Date:  10/20/2022  ID:  [name], DOB [birth year], MRN [id number] PCP: Emilio Aspen, MD  [facility] Providers Cardiologist:  Christell Constant, MD Cardiology APP:  Beatrice Lecher, PA-C       Patient Profile:      Aortic insufficiency TTE 04/09/2022: EF 60-65, no RWMA, GR 2 DD, normal RV SF, normal PASP, severe LAE, trivial MR, mild-moderate AI, AV sclerosis, mild dilation of aortic root (38 mm), moderate dilation of ascending aorta (41 mm), RAP 3, RVSP 27.2 (HFpEF) heart failure with preserved ejection fraction  Admx 03/2022, 05/2022 >> prompted CT scan that dx ILD Thoracic aorta aneurysm Patient has declined further surveillance since 2015 TTE 03/2022: 41 mm Noncontrast CT 06/13/2022: Ascending thoracic aorta 4.5 x 4.4 cm Interstitial lung disease Pulm: Dr. Judeth Horn Hypertension Chronic kidney disease Supraventricular tachycardia History of pericarditis Iron deficiency anemia Chronic hyponatremia DNR           Discussed the use of AI scribe software for clinical note transcription with the patient, who gave verbal consent to proceed.  History of Present Illness   A 89 year old patient with a history of aortic insufficiency, thoracic aortic aneurysm, interstitial lung disease, supraventricular tachycardia, and heart failure with preserved ejection fraction presents for a routine follow-up. The patient was last seen by Dr. Katrinka Blazing in August 2023. She was admitted twice for heart failure in January and March 2024. During these admissions, imaging revealed scarring and evidence of pulmonary fibrosis. The patient was evaluated by pulmonology and conservative management was recommended. The patient's aortic insufficiency was classified as mild to moderate on an echocardiogram in January 2024. The patient also has a thoracic aortic aneurysm, which was measured at 4.5 cm on a CT scan in March 2024, but she has declined further  surveillance. The patient reports feeling okay and denies any recent issues with fluid buildup or swelling in the legs. She does report occasional shortness of breath, but is able to walk without stopping and does not require additional pillows to breathe comfortably. The patient denies any chest discomfort or pain.      ROS: See HPI    Studies Reviewed:       Risk Assessment/Calculations:             Physical Exam:   VS:  BP 120/60   Pulse 82   Ht 5' 4.5" (1.638 m)   Wt 130 lb 9.6 oz (59.2 kg)   SpO2 92%   BMI 22.07 kg/m    Wt Readings from Last 3 Encounters:  10/20/22 130 lb 9.6 oz (59.2 kg)  09/10/22 132 lb 4.4 oz (60 kg)  08/02/22 133 lb 14.4 oz (60.7 kg)    GEN: Well nourished, well developed in no acute distress  NECK: No HJR CARDIAC: RRR, 2/6 diastolic murmur heard best along LLSB RESPIRATORY:  diffuse crackles noted, no wheezing ABDOMEN:  soft EXTREMITIES:  trace R leg edema, 1-2+ L leg edema     Assessment and Plan:  Chronic heart failure with preserved ejection fraction (HFpEF) (HCC) NYHA II-IIb. Stable volume status. Episodes of heart failure in January and March 2024 in the context of interstitial lung disease and pneumonia. No current signs of fluid overload. -Continue Furosemide 20mg  every other day. -Check Basic Metabolic Panel (BMP) today. -Continue daily weight monitoring at home.  Aortic insufficiency Mild to moderate AI on echocardiogram in January 2024. Stable. -No further testing at this time.  Aneurysm of thoracic aorta (  HCC) Patient declined further surveillance. Aorta size stable at 4.5cm in March 2024 by non-contrast CT. -No further surveillance per patient's preference.  Essential hypertension Controlled. Uncertainty about Carvedilol use. Her medications are managed by her son. She thinks it may have been stopped. Review of her chart indicates it was not stopped. Would continue current Rx as she is taking given controlled BP.  -Continue  Carvedilol 12.5mg  twice daily, Cardizem 240mg  daily, Losartan 25mg  daily, and Furosemide 20mg  every other day. -Verify Carvedilol use with patient's son. I have asked that she have him notify our office if she is taking or not.  Chronic kidney disease, stage 3 unspecified (HCC) Recent creatinine fairly stable.  -Check BMET today.  ILD (interstitial lung disease) (HCC) Conservative management was previously recommended by pulmonology. She is DNR. -Follow up with pulmonology as directed.         Dispo:  Return in about 6 months (around 04/22/2023) for Routine Follow Up w/ Dr. Izora Ribas, or Tereso Newcomer, PA-C.  Signed, Tereso Newcomer, PA-C

## 2022-10-20 ENCOUNTER — Ambulatory Visit: Payer: Medicare Other | Attending: Physician Assistant | Admitting: Physician Assistant

## 2022-10-20 ENCOUNTER — Encounter: Payer: Self-pay | Admitting: Physician Assistant

## 2022-10-20 VITALS — BP 120/60 | HR 82 | Ht 64.5 in | Wt 130.6 lb

## 2022-10-20 DIAGNOSIS — I7121 Aneurysm of the ascending aorta, without rupture: Secondary | ICD-10-CM

## 2022-10-20 DIAGNOSIS — I10 Essential (primary) hypertension: Secondary | ICD-10-CM

## 2022-10-20 DIAGNOSIS — I5032 Chronic diastolic (congestive) heart failure: Secondary | ICD-10-CM

## 2022-10-20 DIAGNOSIS — I351 Nonrheumatic aortic (valve) insufficiency: Secondary | ICD-10-CM | POA: Diagnosis not present

## 2022-10-20 DIAGNOSIS — J849 Interstitial pulmonary disease, unspecified: Secondary | ICD-10-CM | POA: Diagnosis not present

## 2022-10-20 DIAGNOSIS — L2089 Other atopic dermatitis: Secondary | ICD-10-CM | POA: Diagnosis not present

## 2022-10-20 DIAGNOSIS — L2081 Atopic neurodermatitis: Secondary | ICD-10-CM | POA: Diagnosis not present

## 2022-10-20 DIAGNOSIS — N1831 Chronic kidney disease, stage 3a: Secondary | ICD-10-CM | POA: Diagnosis not present

## 2022-10-20 NOTE — Assessment & Plan Note (Signed)
Conservative management was previously recommended by pulmonology. She is DNR. -Follow up with pulmonology as directed.

## 2022-10-20 NOTE — Assessment & Plan Note (Signed)
NYHA II-IIb. Stable volume status. Episodes of heart failure in January and March 2024 in the context of interstitial lung disease and pneumonia. No current signs of fluid overload. -Continue Furosemide 20mg  every other day. -Check Basic Metabolic Panel (BMP) today. -Continue daily weight monitoring at home.

## 2022-10-20 NOTE — Assessment & Plan Note (Signed)
Patient declined further surveillance. Aorta size stable at 4.5cm in March 2024 by non-contrast CT. -No further surveillance per patient's preference.

## 2022-10-20 NOTE — Assessment & Plan Note (Signed)
Controlled. Uncertainty about Carvedilol use. Her medications are managed by her son. She thinks it may have been stopped. Review of her chart indicates it was not stopped. Would continue current Rx as she is taking given controlled BP.  -Continue Carvedilol 12.5mg  twice daily, Cardizem 240mg  daily, Losartan 25mg  daily, and Furosemide 20mg  every other day. -Verify Carvedilol use with patient's son. I have asked that she have him notify our office if she is taking or not.

## 2022-10-20 NOTE — Assessment & Plan Note (Signed)
Mild to moderate AI on echocardiogram in January 2024. Stable. -No further testing at this time.

## 2022-10-20 NOTE — Patient Instructions (Addendum)
Medication Instructions:   PLEASE MAKE SURE YOU HAVE YOUR SON GO THROUGH MEDICINES AND UPDATE [place] ON ANY CHANGES   ALSO TO LOOK AT COREG (CARVEDILOL) DOSAGE  AND MESSAGE IN MYCHART CURRENTLY TAKING .  *If you need a refill on your cardiac medications before your next appointment, please call your pharmacy*   Lab Work: BMET TODAY    If you have labs (blood work) drawn today and your tests are completely normal, you will receive your results only by: MyChart Message (if you have MyChart) OR A paper copy in the mail If you have any lab test that is abnormal or we need to change your treatment, we will call you to review the results.   Testing/Procedures: NONE ORDERED  TODAY    Follow-Up: At [facility] you and your health needs are our priority.  As part of our continuing mission to provide you with exceptional heart care, we have created designated Provider Care Teams.  These Care Teams include your primary Cardiologist (physician) and Advanced Practice Providers (APPs -  Physician Assistants and Nurse Practitioners) who all work together to provide you with the care you need, when you need it.  We recommend signing up for the patient portal called "MyChart".  Sign up information is provided on this After Visit Summary.  MyChart is used to connect with patients for Virtual Visits (Telemedicine).  Patients are able to view lab/test results, encounter notes, upcoming appointments, etc.  Non-urgent messages can be sent to your provider as well.   To learn more about what you can do with MyChart, go to [email or web address].    Your next appointment:   6 month(s)  Provider:  Tereso Newcomer PA-C      Other Instructions  PLEASE MAKE SURE YOU CALL CLINIC IF WEIGHT GAIN OF 3 LBS IN A DAY OR 5 LBS IN A WEEK

## 2022-10-20 NOTE — Assessment & Plan Note (Signed)
Recent creatinine fairly stable.  -Check BMET today.

## 2022-10-21 ENCOUNTER — Ambulatory Visit: Payer: Medicare Other | Admitting: Hematology

## 2022-10-21 ENCOUNTER — Other Ambulatory Visit: Payer: Medicare Other

## 2022-10-21 DIAGNOSIS — R296 Repeated falls: Secondary | ICD-10-CM | POA: Diagnosis not present

## 2022-10-21 DIAGNOSIS — M6259 Muscle wasting and atrophy, not elsewhere classified, multiple sites: Secondary | ICD-10-CM | POA: Diagnosis not present

## 2022-10-21 DIAGNOSIS — R2689 Other abnormalities of gait and mobility: Secondary | ICD-10-CM | POA: Diagnosis not present

## 2022-10-22 ENCOUNTER — Other Ambulatory Visit: Payer: Self-pay

## 2022-10-22 DIAGNOSIS — D509 Iron deficiency anemia, unspecified: Secondary | ICD-10-CM

## 2022-10-25 ENCOUNTER — Other Ambulatory Visit: Payer: Self-pay

## 2022-10-25 ENCOUNTER — Inpatient Hospital Stay: Payer: Medicare Other | Attending: Hematology

## 2022-10-25 ENCOUNTER — Inpatient Hospital Stay (HOSPITAL_BASED_OUTPATIENT_CLINIC_OR_DEPARTMENT_OTHER): Payer: Medicare Other | Admitting: Hematology

## 2022-10-25 VITALS — BP 149/81 | HR 83 | Temp 97.4°F | Resp 18 | Wt 133.1 lb

## 2022-10-25 DIAGNOSIS — M069 Rheumatoid arthritis, unspecified: Secondary | ICD-10-CM | POA: Diagnosis not present

## 2022-10-25 DIAGNOSIS — D509 Iron deficiency anemia, unspecified: Secondary | ICD-10-CM | POA: Insufficient documentation

## 2022-10-25 DIAGNOSIS — N182 Chronic kidney disease, stage 2 (mild): Secondary | ICD-10-CM | POA: Diagnosis not present

## 2022-10-25 DIAGNOSIS — D649 Anemia, unspecified: Secondary | ICD-10-CM | POA: Diagnosis not present

## 2022-10-25 DIAGNOSIS — D631 Anemia in chronic kidney disease: Secondary | ICD-10-CM | POA: Insufficient documentation

## 2022-10-25 DIAGNOSIS — Z79899 Other long term (current) drug therapy: Secondary | ICD-10-CM | POA: Insufficient documentation

## 2022-10-25 LAB — CBC WITH DIFFERENTIAL (CANCER CENTER ONLY)
Abs Immature Granulocytes: 0.05 10*3/uL (ref 0.00–0.07)
Basophils Absolute: 0.1 10*3/uL (ref 0.0–0.1)
Basophils Relative: 2 %
Eosinophils Absolute: 0.6 10*3/uL — ABNORMAL HIGH (ref 0.0–0.5)
Eosinophils Relative: 7 %
HCT: 25.5 % — ABNORMAL LOW (ref 36.0–46.0)
Hemoglobin: 8.6 g/dL — ABNORMAL LOW (ref 12.0–15.0)
Immature Granulocytes: 1 %
Lymphocytes Relative: 16 %
Lymphs Abs: 1.4 10*3/uL (ref 0.7–4.0)
MCH: 28.9 pg (ref 26.0–34.0)
MCHC: 33.7 g/dL (ref 30.0–36.0)
MCV: 85.6 fL (ref 80.0–100.0)
Monocytes Absolute: 0.9 10*3/uL (ref 0.1–1.0)
Monocytes Relative: 10 %
Neutro Abs: 5.6 10*3/uL (ref 1.7–7.7)
Neutrophils Relative %: 64 %
Platelet Count: 296 10*3/uL (ref 150–400)
RBC: 2.98 MIL/uL — ABNORMAL LOW (ref 3.87–5.11)
RDW: 15.6 % — ABNORMAL HIGH (ref 11.5–15.5)
WBC Count: 8.7 10*3/uL (ref 4.0–10.5)
nRBC: 0 % (ref 0.0–0.2)

## 2022-10-25 LAB — FERRITIN: Ferritin: 117 ng/mL (ref 11–307)

## 2022-10-25 LAB — CMP (CANCER CENTER ONLY)
ALT: 16 U/L (ref 0–44)
AST: 25 U/L (ref 15–41)
Albumin: 3.8 g/dL (ref 3.5–5.0)
Alkaline Phosphatase: 79 U/L (ref 38–126)
Anion gap: 8 (ref 5–15)
BUN: 50 mg/dL — ABNORMAL HIGH (ref 8–23)
CO2: 23 mmol/L (ref 22–32)
Calcium: 9.1 mg/dL (ref 8.9–10.3)
Chloride: 101 mmol/L (ref 98–111)
Creatinine: 1.29 mg/dL — ABNORMAL HIGH (ref 0.44–1.00)
GFR, Estimated: 39 mL/min — ABNORMAL LOW (ref >60)
Glucose, Bld: 108 mg/dL — ABNORMAL HIGH (ref 70–99)
Potassium: 4.5 mmol/L (ref 3.5–5.1)
Sodium: 132 mmol/L — ABNORMAL LOW (ref 135–145)
Total Bilirubin: 0.3 mg/dL (ref 0.3–1.2)
Total Protein: 8.1 g/dL (ref 6.5–8.1)

## 2022-10-25 LAB — IRON AND IRON BINDING CAPACITY (CC-WL,HP ONLY)
Iron: 36 ug/dL (ref 28–170)
Saturation Ratios: 12 % (ref 10.4–31.8)
TIBC: 301 ug/dL (ref 250–450)
UIBC: 265 ug/dL (ref 148–442)

## 2022-10-25 LAB — VITAMIN B12: Vitamin B-12: 560 pg/mL (ref 180–914)

## 2022-10-25 MED ORDER — TRIAMCINOLONE ACETONIDE 0.1 % EX CREA: TOPICAL_CREAM | Freq: Three times a day (TID) | CUTANEOUS | 0 refills | Status: AC

## 2022-10-25 NOTE — Progress Notes (Signed)
HEMATOLOGY/ONCOLOGY CLINIC NOTE  Date of Service: 10/25/2022   Patient Care Team: Emilio Aspen, MD as PCP - General (Internal Medicine) Christell Constant, MD as PCP - Cardiology (Cardiology) Kennon Rounds as Physician Assistant (Cardiology)  CHIEF COMPLAINTS/PURPOSE OF CONSULTATION:  Continued management of normocytic anemia.  HISTORY OF PRESENTING ILLNESS:  Please see previous notes for details on initial presentation  INTERVAL HISTORY: [name] is a 89 y.o. female here for follow-up of her normocytic anemia.  Patient was last seen by me on 08/02/2022 and she complained of fatigue/tiredness.   Patient notes she has been doing well overall without any new or severe medical concerns. She denies any new infection issues, fever, chills, night sweats, headaches, abnormal bleeding, hematuria, light-headedness, pagophagia, chest pain, abdominal pain. She does complain of mild left leg swelling.   Patient complains of skin rashes near her right shoulder/neck area. She notes that she will get in touch with her dermatologist regarding her skin rash. She follows-up with her dermatologist every 3 months.   Patient notes that her arthritis is stable and is currently not getting any treatment for arthritis.   She currently lives in a retirement housing.   MEDICAL HISTORY:  Past Medical History:  Diagnosis Date   Anemia    CHRONIC   Aortic insufficiency    a. 2D echo 08/29/15: EF 55-60%, mild LVH, diastolic dysfunction, elevated LV filling pressure, mild AI, severe LAE, mild RAE, mild TR, dilated descending thoracic aorta just distal to the takeoff of the left subclavian, measuring 4.1 cm, no pericardial effusion.   Arthritis    OA-SOME BACK AND NECK PAIN,  GOES TO CHIROPRACTOR TWICE A MONTH;  HX OF JOINT REPLACEMENTS    Cancer (HCC)    SKIN CANCERS REMOVED FROM LEGS   CKD (chronic kidney disease), stage II    Depression    Essential hypertension     Gait abnormality 09/08/2017   GERD (gastroesophageal reflux disease)    Low sodium levels    Myocarditis (HCC)    PSVT (paroxysmal supraventricular tachycardia)    Recurrent Pericarditis    RECURRENT    Thoracic aneurysm    a. Followed by [facility] -last seen in 12/2013 w/ plan for f/u MRA and thoracic surgery f/u in 2 yrs, but patient does not wish to follow any longer.    SURGICAL HISTORY: Past Surgical History:  Procedure Laterality Date   APPENDECTOMY     BREAST EXCISIONAL BIOPSY Left 1992   benign   EXCISION/RELEASE BURSA HIP Left 11/24/2012   Procedure: LEFT HIP BURSECTOMY AND TENDON REPAIR ;  Surgeon: Loanne Drilling, MD;  Location: [facility];  Service: Orthopedics;  Laterality: Left;   HIP CLOSED REDUCTION Left 12/19/2012   Procedure: CLOSED MANIPULATION HIP;  Surgeon: Shelda Pal, MD;  Location: [facility];  Service: Orthopedics;  Laterality: Left;   HIP CLOSED REDUCTION Left 12/05/2013   Procedure: CLOSED MANIPULATION HIP;  Surgeon: Verlee Rossetti, MD;  Location: [facility];  Service: Orthopedics;  Laterality: Left;   HIP CLOSED REDUCTION Left 09/22/2015   Procedure: CLOSED REDUCTION HIP;  Surgeon: Venita Lick, MD;  Location: [facility];  Service: Orthopedics;  Laterality: Left;   JOINT REPLACEMENT     LEFT TOTAL HIP REPLACEMENT AND REVISIONS X 2   OOPHORECTOMY     has a partial of one ovary remaingin   PELVIC LAPAROSCOPY     ovarian cyst removal,    REVISION TOTAL HIP ARTHROPLASTY  left   right Achilles Tendon repair  5/13   TOTAL KNEE ARTHROPLASTY     right   TOTAL KNEE ARTHROPLASTY     left   VAGINAL HYSTERECTOMY     with ovarian cyst removal    SOCIAL HISTORY: Social History   Socioeconomic History   Marital status: Widowed    Spouse name: Not on file   Number of children: Not on file   Years of education: Not on file   Highest education level: Not on file  Occupational History   Not on file  Tobacco Use   Smoking status: Former    Current packs/day: 0.00     Types: Cigarettes    Start date: 82    Quit date: 55    Years since quitting: 59.6   Smokeless tobacco: Never   Tobacco comments:    quit age 13  Vaping Use   Vaping status: Never Used  Substance and Sexual Activity   Alcohol use: No    Alcohol/week: 0.0 standard drinks of alcohol   Drug use: Not Currently   Sexual activity: Not Currently    Birth control/protection: Surgical    Comment: HYST-1st intercourse 89 yo-Fewer than 5 partners  Other Topics Concern   Not on file  Social History Narrative   Diet: very good      Caffeine: 1 1/2 cup coffee (usually decaf)      Married, if yes what year: Widow, 1954      Do you live in a house, apartment, assisted living, condo, trailer, ect: [place], independent living      Is it one or more stories: 2nd level. elevate      How many persons live in your home? 1      Pets: No      Highest level or education completed: 14      Current/Past profession: Health and safety inspector      Exercise:  Yes                Type and how often: aerobics twice daily         Living Will: Yes   DNR: Yes   POA/HPOA: Yes      Functional Status:   Do you have difficulty bathing or dressing yourself? No   Do you have difficulty preparing food or eating? No   Do you have difficulty managing your medications? No   Do you have difficulty managing your finances? No   Do you have difficulty affording your medications? No   Social Determinants of Health   Financial Resource Strain: Not on file  Food Insecurity: No Food Insecurity (04/08/2022)   Hunger Vital Sign    Worried About Running Out of Food in the Last Year: Never true    Ran Out of Food in the Last Year: Never true  Transportation Needs: No Transportation Needs (04/08/2022)   PRAPARE - Administrator, Civil Service (Medical): No    Lack of Transportation (Non-Medical): No  Physical Activity: Not on file  Stress: Not on file  Social Connections: Not on file  Intimate  Partner Violence: Not At Risk (04/08/2022)   Humiliation, Afraid, Rape, and Kick questionnaire    Fear of Current or Ex-Partner: No    Emotionally Abused: No    Physically Abused: No    Sexually Abused: No    FAMILY HISTORY: Family History  Problem Relation Age of Onset   Hypertension Mother    Stroke Mother  Heart attack Father 47   Heart failure Father    Heart failure Sister    Aortic aneurysm Sister    Aortic aneurysm Sister    Stroke Sister    Pneumonia Brother    Stroke Brother    ALS Other    Hyperlipidemia Daughter    Aortic aneurysm Son    Breast cancer Neg Hx     ALLERGIES:  is allergic to cephalexin, shellfish-derived products, clindamycin/lincomycin, lincomycin, clindamycin hcl, fosfomycin, levofloxacin, penicillamine, sugar-protein-starch, tape, ciprofloxacin, sulfa antibiotics, and sulfites.  MEDICATIONS:  Current Outpatient Medications  Medication Sig Dispense Refill   amoxicillin (AMOXIL) 500 MG capsule Take 2,000 mg by mouth See admin instructions. Take 2,000 mg by mouth one hour prior to dental procedures     B Complex Vitamins (VITAMIN B COMPLEX) TABS Take 1 tablet by mouth daily with breakfast.     carvedilol (COREG) 12.5 MG tablet Take 3.125 mg by mouth 2 (two) times daily with a meal.     Cholecalciferol (VITAMIN D3) 25 MCG (1000 UT) CAPS Take 1,000 Units by mouth daily.     diltiazem (CARDIZEM CD) 240 MG 24 hr capsule Take 1 capsule (240 mg total) by mouth daily. 90 capsule 3   FEROSUL 325 (65 Fe) MG tablet Take 650 mg by mouth daily with breakfast.     furosemide (LASIX) 20 MG tablet Take 1 tablet (20 mg total) by mouth every other day. 45 tablet 3   losartan (COZAAR) 25 MG tablet Take 1 tablet (25 mg total) by mouth daily. 30 tablet 2   MITIGARE 0.6 MG CAPS Take 0.6 mg by mouth in the morning.     Multiple Vitamins-Minerals (SYSTANE ICAPS AREDS2 PO) Take 1 capsule by mouth in the morning and at bedtime.     NONFORMULARY OR COMPOUNDED ITEM Apply 1  application  topically See admin instructions. Triamcinolone 0.1% cream/Cetaphil compounded cream- Apply to the back, legs, and arms 2 times a day as directed     sodium chloride 1 g tablet Take 1 g by mouth daily.     triamcinolone cream (KENALOG) 0.1 % Apply topically 3 (three) times daily. 30 g 0   TYLENOL 500 MG tablet Take 500-1,000 mg by mouth every 6 (six) hours as needed for mild pain or headache.     No current facility-administered medications for this visit.    REVIEW OF SYSTEMS:   .10 Point review of Systems was done is negative except as noted above.  PHYSICAL EXAMINATION: ECOG PERFORMANCE STATUS: 0 - Asymptomatic  .BP (!) 149/81   Pulse 83   Temp (!) 97.4 F (36.3 C)   Resp 18   Wt 133 lb 1.6 oz (60.4 kg)   SpO2 96%   BMI 22.49 kg/m   Exam was given in a chair.  NAD GENERAL:alert, in no acute distress and comfortable SKIN: no acute rashes, no significant lesions EYES: conjunctiva are pink and non-injected, sclera anicteric NECK: supple, no JVD LYMPH:  no palpable lymphadenopathy in the cervical, axillary or inguinal regions LUNGS: clear to auscultation b/l with normal respiratory effort HEART: regular rate & rhythm ABDOMEN:  normoactive bowel sounds , non tender, not distended. Extremity: no pedal edema PSYCH: alert & oriented x 3 with fluent speech NEURO: no focal motor/sensory deficits  LABORATORY DATA:   I have reviewed the data as listed  .    Latest Ref Rng & Units 08/02/2022    1:21 PM 06/12/2022    3:56 PM 04/12/2022  5:33 AM  CBC  WBC 4.0 - 10.5 K/uL 9.8  6.3  11.7   Hemoglobin 12.0 - 15.0 g/dL 9.3  86.5  8.7   Hematocrit 36.0 - 46.0 % 29.3  40.9  27.3   Platelets 150 - 400 K/uL 278  293  457    . CBC    Component Value Date/Time   WBC 9.8 08/02/2022 1321   WBC 6.3 06/12/2022 1556   RBC 3.40 (L) 08/02/2022 1321   HGB 9.3 (L) 08/02/2022 1321   HGB 10.8 (L) 12/26/2007 1539   HCT 29.3 (L) 08/02/2022 1321   HCT 28.2 (L) 02/17/2021 1001    HCT 33.4 (L) 12/26/2007 1539   PLT 278 08/02/2022 1321   PLT 314 12/26/2007 1539   MCV 86.2 08/02/2022 1321   MCV 88.5 12/26/2007 1539   MCH 27.4 08/02/2022 1321   MCHC 31.7 08/02/2022 1321   RDW 17.6 (H) 08/02/2022 1321   RDW 15.3 (H) 12/26/2007 1539   LYMPHSABS 1.9 08/02/2022 1321   LYMPHSABS 2.2 12/26/2007 1539   MONOABS 1.0 08/02/2022 1321   MONOABS 0.8 12/26/2007 1539   EOSABS 0.7 (H) 08/02/2022 1321   EOSABS 0.6 (H) 12/26/2007 1539   BASOSABS 0.2 (H) 08/02/2022 1321   BASOSABS 0.1 12/26/2007 1539     .    Latest Ref Rng & Units 10/20/2022   11:25 AM 08/02/2022    1:21 PM 06/12/2022    3:56 PM  CMP  Glucose 70 - 99 mg/dL 98  784  696   BUN 10 - 36 mg/dL 29  44  36   Creatinine 0.57 - 1.00 mg/dL 2.95  2.84  1.32   Sodium 134 - 144 mmol/L 136  134  130   Potassium 3.5 - 5.2 mmol/L 5.1  5.1  4.7   Chloride 96 - 106 mmol/L 101  100  95   CO2 20 - 29 mmol/L 23  26  23    Calcium 8.7 - 10.3 mg/dL 9.7  9.3  9.1   Total Protein 6.5 - 8.1 g/dL  7.6  7.3   Total Bilirubin 0.3 - 1.2 mg/dL  0.3  0.3   Alkaline Phos 38 - 126 U/L  71  76   AST 15 - 41 U/L  26  30   ALT 0 - 44 U/L  19  19    Component     Latest Ref Rng & Units 03/06/2020  Iron     41 - 142 ug/dL 35 (L)  TIBC     440 - 444 ug/dL 102 (H)  Saturation Ratios     21 - 57 % 8 (L)  UIBC     120 - 384 ug/dL 725 (H)  Folate, Hemolysate     Not Estab. ng/mL 605.0  HCT     34.0 - 46.6 % 29.6 (L)  Folate, RBC     >498 ng/mL 2,044  Erythropoietin     2.6 - 18.5 mIU/mL 14.9  RA Latex Turbid.     <14.0 IU/mL 99.5 (H)  Vitamin B12     180 - 914 pg/mL 841  Ferritin     11 - 307 ng/mL 35  Sed Rate     0 - 22 mm/hr 22   . Lab Results  Component Value Date   IRON 40 08/02/2022   TIBC 305 08/02/2022   IRONPCTSAT 13 08/02/2022   (Iron and TIBC)  Lab Results  Component Value Date   FERRITIN 217 08/02/2022  RADIOGRAPHIC STUDIES: I have personally reviewed the radiological images as listed and  agreed with the findings in the report. No results found.  ASSESSMENT & PLAN:   89 yo with   1) Chronic normocytic anemia Due to CKD + iron deficiency + anemia of chronic inflammation from possible RA 2) RA -not currently on treatment  PLAN: -Discussed lab results from today, 10/25/2022, with the patient. CBC shows decreased hemoglobin at 8.6 g/dL and decreased hematocrit at 25.5%. CMP shows elevated Bun at 50 and elevated creatinine at 1.29 and slightly decreased sodium level at 132. Iron labs are pending.  -Discussed to drink at least 2 L of water everyday.  -Discussed that it iron labs show iron deficiency then we will proceed with IV iron. If iron labs does not show iron deficiency, then we will most likely proceed with monthly erythropoietin shots. Patient agrees with the plan.  -Will prescribe Kenalog ointment for skin rashes.    FOLLOW UP: RTC with Dr Candise Che with labs in 3 months   The total time spent in the appointment was 20 minutes* .  All of the patient's questions were answered with apparent satisfaction. The patient knows to call the clinic with any problems, questions or concerns.   Wyvonnia Lora MD MS AAHIVMS [facility] Hematology/Oncology Physician [facility]  .*Total Encounter Time as defined by the Centers for Medicare and Medicaid Services includes, in addition to the face-to-face time of a patient visit (documented in the note above) non-face-to-face time: obtaining and reviewing outside history, ordering and reviewing medications, tests or procedures, care coordination (communications with other health care professionals or caregivers) and documentation in the medical record.   I, Ok Edwards, acting as a Neurosurgeon for Wyvonnia Lora, MD., have documented all relevant documentation on the behalf of Wyvonnia Lora, MD, as directed by  Wyvonnia Lora, MD while in the presence of Wyvonnia Lora, MD.  .I have reviewed the above documentation for accuracy and completeness,  and I agree with the above. Johney Maine MD

## 2022-10-26 DIAGNOSIS — R2689 Other abnormalities of gait and mobility: Secondary | ICD-10-CM | POA: Diagnosis not present

## 2022-10-26 DIAGNOSIS — R296 Repeated falls: Secondary | ICD-10-CM | POA: Diagnosis not present

## 2022-10-26 DIAGNOSIS — M6259 Muscle wasting and atrophy, not elsewhere classified, multiple sites: Secondary | ICD-10-CM | POA: Diagnosis not present

## 2022-10-27 ENCOUNTER — Ambulatory Visit: Payer: Medicare Other | Admitting: Physician Assistant

## 2022-10-27 DIAGNOSIS — L2089 Other atopic dermatitis: Secondary | ICD-10-CM | POA: Diagnosis not present

## 2022-10-27 DIAGNOSIS — L2081 Atopic neurodermatitis: Secondary | ICD-10-CM | POA: Diagnosis not present

## 2022-10-27 DIAGNOSIS — L309 Dermatitis, unspecified: Secondary | ICD-10-CM | POA: Diagnosis not present

## 2022-10-27 DIAGNOSIS — L308 Other specified dermatitis: Secondary | ICD-10-CM | POA: Diagnosis not present

## 2022-10-27 DIAGNOSIS — Z85828 Personal history of other malignant neoplasm of skin: Secondary | ICD-10-CM | POA: Diagnosis not present

## 2022-10-27 DIAGNOSIS — L299 Pruritus, unspecified: Secondary | ICD-10-CM | POA: Diagnosis not present

## 2022-10-28 ENCOUNTER — Other Ambulatory Visit: Payer: Self-pay | Admitting: Physician Assistant

## 2022-10-28 DIAGNOSIS — I10 Essential (primary) hypertension: Secondary | ICD-10-CM

## 2022-10-28 DIAGNOSIS — R2689 Other abnormalities of gait and mobility: Secondary | ICD-10-CM | POA: Diagnosis not present

## 2022-10-28 DIAGNOSIS — R296 Repeated falls: Secondary | ICD-10-CM | POA: Diagnosis not present

## 2022-10-28 DIAGNOSIS — M6259 Muscle wasting and atrophy, not elsewhere classified, multiple sites: Secondary | ICD-10-CM | POA: Diagnosis not present

## 2022-10-29 DIAGNOSIS — L2089 Other atopic dermatitis: Secondary | ICD-10-CM | POA: Diagnosis not present

## 2022-10-29 DIAGNOSIS — L2081 Atopic neurodermatitis: Secondary | ICD-10-CM | POA: Diagnosis not present

## 2022-10-31 ENCOUNTER — Encounter: Payer: Self-pay | Admitting: Hematology

## 2022-11-01 DIAGNOSIS — L2089 Other atopic dermatitis: Secondary | ICD-10-CM | POA: Diagnosis not present

## 2022-11-01 DIAGNOSIS — L2081 Atopic neurodermatitis: Secondary | ICD-10-CM | POA: Diagnosis not present

## 2022-11-03 DIAGNOSIS — M25511 Pain in right shoulder: Secondary | ICD-10-CM | POA: Diagnosis not present

## 2022-11-04 DIAGNOSIS — M6259 Muscle wasting and atrophy, not elsewhere classified, multiple sites: Secondary | ICD-10-CM | POA: Diagnosis not present

## 2022-11-04 DIAGNOSIS — R2689 Other abnormalities of gait and mobility: Secondary | ICD-10-CM | POA: Diagnosis not present

## 2022-11-04 DIAGNOSIS — R296 Repeated falls: Secondary | ICD-10-CM | POA: Diagnosis not present

## 2022-11-08 DIAGNOSIS — R2689 Other abnormalities of gait and mobility: Secondary | ICD-10-CM | POA: Diagnosis not present

## 2022-11-08 DIAGNOSIS — M6259 Muscle wasting and atrophy, not elsewhere classified, multiple sites: Secondary | ICD-10-CM | POA: Diagnosis not present

## 2022-11-08 DIAGNOSIS — R296 Repeated falls: Secondary | ICD-10-CM | POA: Diagnosis not present

## 2022-11-09 ENCOUNTER — Encounter: Payer: Self-pay | Admitting: Podiatry

## 2022-11-09 ENCOUNTER — Ambulatory Visit: Payer: Medicare Other | Admitting: Podiatry

## 2022-11-09 DIAGNOSIS — L608 Other nail disorders: Secondary | ICD-10-CM

## 2022-11-09 DIAGNOSIS — M79674 Pain in right toe(s): Secondary | ICD-10-CM | POA: Diagnosis not present

## 2022-11-09 DIAGNOSIS — B351 Tinea unguium: Secondary | ICD-10-CM | POA: Diagnosis not present

## 2022-11-09 DIAGNOSIS — M79675 Pain in left toe(s): Secondary | ICD-10-CM

## 2022-11-09 NOTE — Progress Notes (Signed)
This patient returns to the office for evaluation and treatment of long thick painful nails .  This patient is unable to trim his own nails since the patient cannot reach his feet.  Patient says the nails are painful walking and wearing his shoes.   She has painful callus on the bottom of her right big toe. She returns for preventive foot care services.  General Appearance  Alert, conversant and in no acute stress.  Vascular  Dorsalis pedis and posterior tibial  pulses are palpable  bilaterally.  Capillary return is within normal limits  bilaterally. Temperature is within normal limits  bilaterally.  Neurologic  Senn-Weinstein monofilament wire test within normal limits  bilaterally. Muscle power within normal limits bilaterally.  Nails Thick disfigured discolored nails with subungual debris  from hallux to fifth toes bilaterally. No evidence of bacterial infection or drainage bilaterally. Pincer fourth toenails  B/l.  Orthopedic  No limitations of motion  feet .  No crepitus or effusions noted.  No bony pathology or digital deformities noted. 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  Skin  normotropic  bilaterally.  No signs of infections or ulcers noted.       Onychomycosis  Pain in toes right foot  Pain in toes left foot    Debridement  of nails  1-5  B/L with a nail nipper.  Nails were then filed using a dremel tool with no incidents.      RTC 9 weeks     Helane Gunther DPM

## 2022-11-10 DIAGNOSIS — R296 Repeated falls: Secondary | ICD-10-CM | POA: Diagnosis not present

## 2022-11-10 DIAGNOSIS — M6259 Muscle wasting and atrophy, not elsewhere classified, multiple sites: Secondary | ICD-10-CM | POA: Diagnosis not present

## 2022-11-10 DIAGNOSIS — R2689 Other abnormalities of gait and mobility: Secondary | ICD-10-CM | POA: Diagnosis not present

## 2022-11-17 DIAGNOSIS — M6259 Muscle wasting and atrophy, not elsewhere classified, multiple sites: Secondary | ICD-10-CM | POA: Diagnosis not present

## 2022-11-17 DIAGNOSIS — R296 Repeated falls: Secondary | ICD-10-CM | POA: Diagnosis not present

## 2022-11-17 DIAGNOSIS — R2689 Other abnormalities of gait and mobility: Secondary | ICD-10-CM | POA: Diagnosis not present

## 2022-11-22 DIAGNOSIS — L2089 Other atopic dermatitis: Secondary | ICD-10-CM | POA: Diagnosis not present

## 2022-11-22 DIAGNOSIS — L2081 Atopic neurodermatitis: Secondary | ICD-10-CM | POA: Diagnosis not present

## 2022-11-25 DIAGNOSIS — H43813 Vitreous degeneration, bilateral: Secondary | ICD-10-CM | POA: Diagnosis not present

## 2022-11-25 DIAGNOSIS — H26491 Other secondary cataract, right eye: Secondary | ICD-10-CM | POA: Diagnosis not present

## 2022-11-25 DIAGNOSIS — H353132 Nonexudative age-related macular degeneration, bilateral, intermediate dry stage: Secondary | ICD-10-CM | POA: Diagnosis not present

## 2022-11-25 DIAGNOSIS — L2089 Other atopic dermatitis: Secondary | ICD-10-CM | POA: Diagnosis not present

## 2022-11-25 DIAGNOSIS — Z961 Presence of intraocular lens: Secondary | ICD-10-CM | POA: Diagnosis not present

## 2022-11-29 DIAGNOSIS — L2089 Other atopic dermatitis: Secondary | ICD-10-CM | POA: Diagnosis not present

## 2022-11-30 DIAGNOSIS — M19011 Primary osteoarthritis, right shoulder: Secondary | ICD-10-CM | POA: Diagnosis not present

## 2022-12-01 DIAGNOSIS — L2089 Other atopic dermatitis: Secondary | ICD-10-CM | POA: Diagnosis not present

## 2022-12-01 DIAGNOSIS — L2081 Atopic neurodermatitis: Secondary | ICD-10-CM | POA: Diagnosis not present

## 2022-12-05 ENCOUNTER — Other Ambulatory Visit: Payer: Self-pay

## 2022-12-05 ENCOUNTER — Emergency Department (EMERGENCY_DEPARTMENT_HOSPITAL): Payer: Medicare Other | Admitting: Certified Registered"

## 2022-12-05 ENCOUNTER — Emergency Department (HOSPITAL_COMMUNITY): Payer: Medicare Other

## 2022-12-05 ENCOUNTER — Encounter (HOSPITAL_COMMUNITY)
Admission: EM | Disposition: A | Discharge status: Home / Self Care | Payer: Self-pay | Source: Home / Self Care | Attending: Emergency Medicine

## 2022-12-05 ENCOUNTER — Ambulatory Visit (HOSPITAL_COMMUNITY)
Admission: EM | Admit: 2022-12-05 | Discharge: 2022-12-05 | Disposition: A | Discharge status: Home / Self Care | Payer: Medicare Other | Source: Home / Self Care | Attending: Emergency Medicine | Admitting: Emergency Medicine

## 2022-12-05 ENCOUNTER — Encounter (HOSPITAL_COMMUNITY): Payer: Self-pay

## 2022-12-05 ENCOUNTER — Emergency Department (HOSPITAL_COMMUNITY): Payer: Medicare Other | Admitting: Certified Registered"

## 2022-12-05 DIAGNOSIS — S79919A Unspecified injury of unspecified hip, initial encounter: Secondary | ICD-10-CM | POA: Diagnosis not present

## 2022-12-05 DIAGNOSIS — Z96652 Presence of left artificial knee joint: Secondary | ICD-10-CM | POA: Diagnosis not present

## 2022-12-05 DIAGNOSIS — Z85828 Personal history of other malignant neoplasm of skin: Secondary | ICD-10-CM | POA: Diagnosis not present

## 2022-12-05 DIAGNOSIS — Z91048 Other nonmedicinal substance allergy status: Secondary | ICD-10-CM | POA: Diagnosis not present

## 2022-12-05 DIAGNOSIS — D72829 Elevated white blood cell count, unspecified: Secondary | ICD-10-CM | POA: Diagnosis not present

## 2022-12-05 DIAGNOSIS — J8410 Pulmonary fibrosis, unspecified: Secondary | ICD-10-CM | POA: Diagnosis not present

## 2022-12-05 DIAGNOSIS — Z8249 Family history of ischemic heart disease and other diseases of the circulatory system: Secondary | ICD-10-CM | POA: Diagnosis not present

## 2022-12-05 DIAGNOSIS — T50995A Adverse effect of other drugs, medicaments and biological substances, initial encounter: Secondary | ICD-10-CM | POA: Diagnosis present

## 2022-12-05 DIAGNOSIS — F332 Major depressive disorder, recurrent severe without psychotic features: Secondary | ICD-10-CM | POA: Diagnosis not present

## 2022-12-05 DIAGNOSIS — E785 Hyperlipidemia, unspecified: Secondary | ICD-10-CM

## 2022-12-05 DIAGNOSIS — M47812 Spondylosis without myelopathy or radiculopathy, cervical region: Secondary | ICD-10-CM | POA: Diagnosis not present

## 2022-12-05 DIAGNOSIS — Z96653 Presence of artificial knee joint, bilateral: Secondary | ICD-10-CM | POA: Diagnosis present

## 2022-12-05 DIAGNOSIS — Z880 Allergy status to penicillin: Secondary | ICD-10-CM | POA: Diagnosis not present

## 2022-12-05 DIAGNOSIS — M5030 Other cervical disc degeneration, unspecified cervical region: Secondary | ICD-10-CM | POA: Diagnosis not present

## 2022-12-05 DIAGNOSIS — T84021A Dislocation of internal left hip prosthesis, initial encounter: Secondary | ICD-10-CM | POA: Diagnosis not present

## 2022-12-05 DIAGNOSIS — Z9102 Food additives allergy status: Secondary | ICD-10-CM | POA: Diagnosis not present

## 2022-12-05 DIAGNOSIS — F4325 Adjustment disorder with mixed disturbance of emotions and conduct: Secondary | ICD-10-CM | POA: Diagnosis not present

## 2022-12-05 DIAGNOSIS — M6281 Muscle weakness (generalized): Secondary | ICD-10-CM | POA: Diagnosis not present

## 2022-12-05 DIAGNOSIS — Z91013 Allergy to seafood: Secondary | ICD-10-CM | POA: Diagnosis not present

## 2022-12-05 DIAGNOSIS — Z23 Encounter for immunization: Secondary | ICD-10-CM | POA: Diagnosis not present

## 2022-12-05 DIAGNOSIS — T84020S Dislocation of internal right hip prosthesis, sequela: Secondary | ICD-10-CM | POA: Diagnosis not present

## 2022-12-05 DIAGNOSIS — R531 Weakness: Secondary | ICD-10-CM | POA: Diagnosis not present

## 2022-12-05 DIAGNOSIS — N182 Chronic kidney disease, stage 2 (mild): Secondary | ICD-10-CM | POA: Diagnosis present

## 2022-12-05 DIAGNOSIS — M1990 Unspecified osteoarthritis, unspecified site: Secondary | ICD-10-CM | POA: Diagnosis not present

## 2022-12-05 DIAGNOSIS — Z96642 Presence of left artificial hip joint: Secondary | ICD-10-CM | POA: Diagnosis not present

## 2022-12-05 DIAGNOSIS — M79605 Pain in left leg: Secondary | ICD-10-CM | POA: Diagnosis not present

## 2022-12-05 DIAGNOSIS — M5416 Radiculopathy, lumbar region: Secondary | ICD-10-CM | POA: Diagnosis not present

## 2022-12-05 DIAGNOSIS — E559 Vitamin D deficiency, unspecified: Secondary | ICD-10-CM | POA: Diagnosis not present

## 2022-12-05 DIAGNOSIS — Z881 Allergy status to other antibiotic agents status: Secondary | ICD-10-CM | POA: Diagnosis not present

## 2022-12-05 DIAGNOSIS — Z87891 Personal history of nicotine dependence: Secondary | ICD-10-CM | POA: Diagnosis not present

## 2022-12-05 DIAGNOSIS — X58XXXA Exposure to other specified factors, initial encounter: Secondary | ICD-10-CM | POA: Insufficient documentation

## 2022-12-05 DIAGNOSIS — E871 Hypo-osmolality and hyponatremia: Secondary | ICD-10-CM | POA: Diagnosis not present

## 2022-12-05 DIAGNOSIS — G9340 Encephalopathy, unspecified: Secondary | ICD-10-CM | POA: Diagnosis not present

## 2022-12-05 DIAGNOSIS — S73006A Unspecified dislocation of unspecified hip, initial encounter: Secondary | ICD-10-CM | POA: Diagnosis not present

## 2022-12-05 DIAGNOSIS — Z7401 Bed confinement status: Secondary | ICD-10-CM | POA: Diagnosis not present

## 2022-12-05 DIAGNOSIS — S73005A Unspecified dislocation of left hip, initial encounter: Secondary | ICD-10-CM | POA: Diagnosis not present

## 2022-12-05 DIAGNOSIS — E860 Dehydration: Secondary | ICD-10-CM | POA: Diagnosis not present

## 2022-12-05 DIAGNOSIS — I130 Hypertensive heart and chronic kidney disease with heart failure and stage 1 through stage 4 chronic kidney disease, or unspecified chronic kidney disease: Secondary | ICD-10-CM | POA: Diagnosis not present

## 2022-12-05 DIAGNOSIS — I10 Essential (primary) hypertension: Secondary | ICD-10-CM

## 2022-12-05 DIAGNOSIS — N183 Chronic kidney disease, stage 3 unspecified: Secondary | ICD-10-CM | POA: Diagnosis not present

## 2022-12-05 DIAGNOSIS — N19 Unspecified kidney failure: Secondary | ICD-10-CM | POA: Diagnosis not present

## 2022-12-05 DIAGNOSIS — I5032 Chronic diastolic (congestive) heart failure: Secondary | ICD-10-CM | POA: Diagnosis not present

## 2022-12-05 DIAGNOSIS — G9341 Metabolic encephalopathy: Secondary | ICD-10-CM | POA: Diagnosis present

## 2022-12-05 DIAGNOSIS — D509 Iron deficiency anemia, unspecified: Secondary | ICD-10-CM | POA: Diagnosis not present

## 2022-12-05 DIAGNOSIS — Y792 Prosthetic and other implants, materials and accessory orthopedic devices associated with adverse incidents: Secondary | ICD-10-CM | POA: Insufficient documentation

## 2022-12-05 DIAGNOSIS — M79604 Pain in right leg: Secondary | ICD-10-CM | POA: Diagnosis not present

## 2022-12-05 DIAGNOSIS — M064 Inflammatory polyarthropathy: Secondary | ICD-10-CM | POA: Diagnosis not present

## 2022-12-05 DIAGNOSIS — I351 Nonrheumatic aortic (valve) insufficiency: Secondary | ICD-10-CM | POA: Diagnosis present

## 2022-12-05 DIAGNOSIS — R4182 Altered mental status, unspecified: Secondary | ICD-10-CM | POA: Diagnosis not present

## 2022-12-05 DIAGNOSIS — I6782 Cerebral ischemia: Secondary | ICD-10-CM | POA: Diagnosis not present

## 2022-12-05 DIAGNOSIS — R609 Edema, unspecified: Secondary | ICD-10-CM | POA: Diagnosis not present

## 2022-12-05 DIAGNOSIS — R410 Disorientation, unspecified: Secondary | ICD-10-CM | POA: Diagnosis not present

## 2022-12-05 DIAGNOSIS — J849 Interstitial pulmonary disease, unspecified: Secondary | ICD-10-CM | POA: Diagnosis not present

## 2022-12-05 DIAGNOSIS — Z882 Allergy status to sulfonamides status: Secondary | ICD-10-CM | POA: Diagnosis not present

## 2022-12-05 DIAGNOSIS — I129 Hypertensive chronic kidney disease with stage 1 through stage 4 chronic kidney disease, or unspecified chronic kidney disease: Secondary | ICD-10-CM | POA: Insufficient documentation

## 2022-12-05 DIAGNOSIS — D631 Anemia in chronic kidney disease: Secondary | ICD-10-CM | POA: Diagnosis present

## 2022-12-05 DIAGNOSIS — Z471 Aftercare following joint replacement surgery: Secondary | ICD-10-CM | POA: Diagnosis not present

## 2022-12-05 DIAGNOSIS — Z66 Do not resuscitate: Secondary | ICD-10-CM | POA: Diagnosis present

## 2022-12-05 DIAGNOSIS — D638 Anemia in other chronic diseases classified elsewhere: Secondary | ICD-10-CM | POA: Diagnosis not present

## 2022-12-05 DIAGNOSIS — L2489 Irritant contact dermatitis due to other agents: Secondary | ICD-10-CM | POA: Diagnosis not present

## 2022-12-05 DIAGNOSIS — M6282 Rhabdomyolysis: Secondary | ICD-10-CM | POA: Diagnosis present

## 2022-12-05 DIAGNOSIS — T148XXA Other injury of unspecified body region, initial encounter: Secondary | ICD-10-CM | POA: Diagnosis not present

## 2022-12-05 HISTORY — PX: HIP CLOSED REDUCTION: SHX983

## 2022-12-05 LAB — CBC WITH DIFFERENTIAL/PLATELET
Abs Immature Granulocytes: 0.07 10*3/uL (ref 0.00–0.07)
Basophils Absolute: 0.2 10*3/uL — ABNORMAL HIGH (ref 0.0–0.1)
Basophils Relative: 2 %
Eosinophils Absolute: 0.6 10*3/uL — ABNORMAL HIGH (ref 0.0–0.5)
Eosinophils Relative: 6 %
HCT: 27.8 % — ABNORMAL LOW (ref 36.0–46.0)
Hemoglobin: 8.9 g/dL — ABNORMAL LOW (ref 12.0–15.0)
Immature Granulocytes: 1 %
Lymphocytes Relative: 14 %
Lymphs Abs: 1.4 10*3/uL (ref 0.7–4.0)
MCH: 27.6 pg (ref 26.0–34.0)
MCHC: 32 g/dL (ref 30.0–36.0)
MCV: 86.3 fL (ref 80.0–100.0)
Monocytes Absolute: 1.2 10*3/uL — ABNORMAL HIGH (ref 0.1–1.0)
Monocytes Relative: 12 %
Neutro Abs: 6.9 10*3/uL (ref 1.7–7.7)
Neutrophils Relative %: 65 %
Platelets: 330 10*3/uL (ref 150–400)
RBC: 3.22 MIL/uL — ABNORMAL LOW (ref 3.87–5.11)
RDW: 15.7 % — ABNORMAL HIGH (ref 11.5–15.5)
WBC: 10.4 10*3/uL (ref 4.0–10.5)
nRBC: 0 % (ref 0.0–0.2)

## 2022-12-05 LAB — BASIC METABOLIC PANEL
Anion gap: 9 (ref 5–15)
BUN: 47 mg/dL — ABNORMAL HIGH (ref 8–23)
CO2: 21 mmol/L — ABNORMAL LOW (ref 22–32)
Calcium: 8.5 mg/dL — ABNORMAL LOW (ref 8.9–10.3)
Chloride: 104 mmol/L (ref 98–111)
Creatinine, Ser: 0.97 mg/dL (ref 0.44–1.00)
GFR, Estimated: 56 mL/min — ABNORMAL LOW (ref >60)
Glucose, Bld: 105 mg/dL — ABNORMAL HIGH (ref 70–99)
Potassium: 3.9 mmol/L (ref 3.5–5.1)
Sodium: 134 mmol/L — ABNORMAL LOW (ref 135–145)

## 2022-12-05 SURGERY — CLOSED MANIPULATION, JOINT, HIP
Anesthesia: Monitor Anesthesia Care | Site: Hip | Laterality: Left

## 2022-12-05 MED ORDER — PROPOFOL 10 MG/ML IV BOLUS
INTRAVENOUS | Status: AC
  Filled 2022-12-05: qty 20

## 2022-12-05 MED ORDER — HYDROMORPHONE HCL 1 MG/ML IJ SOLN
0.5000 mg | Freq: Once | INTRAMUSCULAR | Status: AC
  Administered 2022-12-05: 0.5 mg via INTRAVENOUS
  Filled 2022-12-05: qty 1

## 2022-12-05 MED ORDER — KETAMINE HCL 50 MG/5ML IJ SOSY
0.5000 mg/kg | PREFILLED_SYRINGE | Freq: Once | INTRAMUSCULAR | Status: AC
  Administered 2022-12-05: 50 mg via INTRAVENOUS
  Filled 2022-12-05: qty 5

## 2022-12-05 MED ORDER — DROPERIDOL 2.5 MG/ML IJ SOLN: 0.6250 mg | Freq: Once | INTRAMUSCULAR | Status: DC | PRN

## 2022-12-05 MED ORDER — PROPOFOL 10 MG/ML IV BOLUS
INTRAVENOUS | Status: DC | PRN
  Administered 2022-12-05: 20 mg via INTRAVENOUS
  Administered 2022-12-05: 100 mg via INTRAVENOUS

## 2022-12-05 MED ORDER — KETAMINE HCL 10 MG/ML IJ SOLN
INTRAMUSCULAR | Status: AC | PRN
  Administered 2022-12-05: 20 mg via INTRAVENOUS

## 2022-12-05 MED ORDER — PROPOFOL 10 MG/ML IV BOLUS
INTRAVENOUS | Status: AC | PRN
  Administered 2022-12-05: 50 mg via INTRAVENOUS
  Administered 2022-12-05: 30 mg via INTRAVENOUS
  Administered 2022-12-05 (×2): 50 mg via INTRAVENOUS

## 2022-12-05 MED ORDER — ONDANSETRON HCL 4 MG/2ML IJ SOLN
INTRAMUSCULAR | Status: DC | PRN
  Administered 2022-12-05: 4 mg via INTRAVENOUS

## 2022-12-05 MED ORDER — LORAZEPAM 2 MG/ML IJ SOLN
0.5000 mg | Freq: Once | INTRAMUSCULAR | Status: AC
  Administered 2022-12-05: 0.5 mg via INTRAVENOUS
  Filled 2022-12-05: qty 1

## 2022-12-05 MED ORDER — HYDROMORPHONE HCL 1 MG/ML IJ SOLN
1.0000 mg | Freq: Once | INTRAMUSCULAR | Status: AC
  Administered 2022-12-05: 1 mg via INTRAVENOUS
  Filled 2022-12-05: qty 1

## 2022-12-05 MED ORDER — PROPOFOL 10 MG/ML IV BOLUS
0.5000 mg/kg | Freq: Once | INTRAVENOUS | Status: AC
  Administered 2022-12-05: 160 mg via INTRAVENOUS
  Filled 2022-12-05: qty 20

## 2022-12-05 MED ORDER — SUCCINYLCHOLINE CHLORIDE 200 MG/10ML IV SOSY
PREFILLED_SYRINGE | INTRAVENOUS | Status: DC | PRN
  Administered 2022-12-05: 60 mg via INTRAVENOUS

## 2022-12-05 MED ORDER — LACTATED RINGERS IV SOLN
INTRAVENOUS | Status: DC | PRN
Start: 2022-12-05 — End: 2022-12-05

## 2022-12-05 MED ORDER — FENTANYL CITRATE PF 50 MCG/ML IJ SOSY: 25.0000 ug | PREFILLED_SYRINGE | INTRAMUSCULAR | Status: DC | PRN

## 2022-12-05 MED ORDER — KETAMINE HCL 10 MG/ML IJ SOLN
INTRAMUSCULAR | Status: AC | PRN
  Administered 2022-12-05: 30 mg via INTRAVENOUS

## 2022-12-05 SURGICAL SUPPLY — 19 items
BAG COUNTER SPONGE SURGICOUNT (BAG) IMPLANT
BAG SPNG CNTER NS LX DISP (BAG)
BNDG ADH 1X3 SHEER STRL LF (GAUZE/BANDAGES/DRESSINGS) IMPLANT
BNDG ADH THN 3X1 STRL LF (GAUZE/BANDAGES/DRESSINGS)
COVER SURGICAL LIGHT HANDLE (MISCELLANEOUS) ×1 IMPLANT
GAUZE SPONGE 4X4 12PLY STRL (GAUZE/BANDAGES/DRESSINGS) IMPLANT
GLOVE BIO SURGEON STRL SZ 6.5 (GLOVE) ×2 IMPLANT
GLOVE BIO SURGEON STRL SZ8 (GLOVE) ×1 IMPLANT
GLOVE BIOGEL PI IND STRL 7.0 (GLOVE) ×2 IMPLANT
GLOVE BIOGEL PI IND STRL 8 (GLOVE) ×1 IMPLANT
GLOVE SURG SS PI 7.0 STRL IVOR (GLOVE) ×1 IMPLANT
GOWN STRL REUS W/ TWL LRG LVL3 (GOWN DISPOSABLE) ×3 IMPLANT
GOWN STRL REUS W/TWL LRG LVL3 (GOWN DISPOSABLE) ×3
MANIFOLD NEPTUNE II (INSTRUMENTS) ×1 IMPLANT
NDL SAFETY ECLIP 18X1.5 (MISCELLANEOUS) IMPLANT
PADDING CAST COTTON 6X4 STRL (CAST SUPPLIES) ×2 IMPLANT
PROTECTOR NERVE ULNAR (MISCELLANEOUS) ×1 IMPLANT
SYR CONTROL 10ML LL (SYRINGE) IMPLANT
TOWEL OR 17X26 10 PK STRL BLUE (TOWEL DISPOSABLE) ×2 IMPLANT

## 2022-12-05 NOTE — Anesthesia Postprocedure Evaluation (Signed)
Anesthesia Post Note  Patient: [name]  Procedure(s) Performed: CLOSED MANIPULATION HIP (Left: Hip)     Patient location during evaluation: PACU Anesthesia Type: General Level of consciousness: awake and alert Pain management: pain level controlled Vital Signs Assessment: post-procedure vital signs reviewed and stable Respiratory status: spontaneous breathing, nonlabored ventilation, respiratory function stable and patient connected to nasal cannula oxygen Cardiovascular status: blood pressure returned to baseline and stable Postop Assessment: no apparent nausea or vomiting Anesthetic complications: no   No notable events documented.  Last Vitals:  Vitals:   12/05/22 0815 12/05/22 0830  BP: 131/61 132/69  Pulse: 67 64  Resp: 16 12  Temp:  (!) 36.1 C  SpO2: 99% 100%    Last Pain:  Vitals:   12/05/22 0830  TempSrc:   PainSc: Asleep                 Gibsonia Nation

## 2022-12-05 NOTE — ED Notes (Signed)
Pt prep prior transferring her  to PACU pt bracelet on her right arm, unable to take it off.

## 2022-12-05 NOTE — H&P (View-Only) (Signed)
Case discussed with the ED physician.  Unfortunately, to attempts at closed reduction in the ER were unsuccessful.  Will need to take her up to the OR for adequate relaxation and close reduction maneuver there.  Please keep NPO.

## 2022-12-05 NOTE — ED Notes (Signed)
Close reduction of left hip done by Glynn Octave MD at bedside with sedation.

## 2022-12-05 NOTE — Brief Op Note (Signed)
12/05/2022  8:04 AM  PATIENT:  Daryel Gerald  89 y.o. female  PRE-OPERATIVE DIAGNOSIS:  left prosthetic hip dislocation  POST-OPERATIVE DIAGNOSIS:  left prosthetic hip dislocation  PROCEDURE:  Procedure(s): CLOSED MANIPULATION HIP (Left)  SURGEON:  Surgeons and Role:    Ollen Gross, MD - Primary  PHYSICIAN ASSISTANT:   ASSISTANTS: Arther Abbott, PA-C   ANESTHESIA:   general  EBL:  None  BLOOD ADMINISTERED:none  DRAINS: none   LOCAL MEDICATIONS USED:  NONE  COUNTS:  YES  TOURNIQUET:  * No tourniquets in log *  DICTATION: .Other Dictation: Dictation Number [id number]  PLAN OF CARE: Discharge to home after PACU  PATIENT DISPOSITION:  PACU - hemodynamically stable.

## 2022-12-05 NOTE — Transfer of Care (Signed)
Immediate Anesthesia Transfer of Care Note  Patient: Daryel Gerald  Procedure(s) Performed: CLOSED MANIPULATION HIP (Left: Hip)  Patient Location: PACU  Anesthesia Type:General  Level of Consciousness: awake, alert , and oriented  Airway & Oxygen Therapy: Patient Spontanous Breathing and Patient connected to face mask oxygen  Post-op Assessment: Report given to RN and Post -op Vital signs reviewed and stable  Post vital signs: Reviewed and stable  Last Vitals:  Vitals Value Taken Time  BP 102/59 12/05/22 0806  Temp    Pulse 58 12/05/22 0808  Resp 7 12/05/22 0808  SpO2 100 % 12/05/22 0808  Vitals shown include unfiled device data.  Last Pain:  Vitals:   12/05/22 0602  TempSrc:   PainSc: 10-Worst pain ever         Complications: No notable events documented.

## 2022-12-05 NOTE — Anesthesia Procedure Notes (Signed)
Date/Time: 12/05/2022 7:40 AM  Performed by: Marquita Lias D, CRNAOxygen Delivery Method: Circle system utilized Preoxygenation: Pre-oxygenation with 100% oxygen Induction Type: IV induction Ventilation: Mask ventilation without difficulty

## 2022-12-05 NOTE — ED Triage Notes (Signed)
Pt BIB EMS from [facility] assisted living for a left hip dislocation. Pt's hip dislocated after sitting in the chair and bending over. Pt was given 75 mcg of fentanyl for pain.  18g IV right forearm.

## 2022-12-05 NOTE — H&P (Signed)
[name] is an 89 y.o. female.   Chief Complaint: Left hip dislocation HPI: 89 yo female with complex history regarding left hip who was tending to her left foot last night and bent over dislocating the hip. She has had multiple hip[ surgeries in the past and multiple dislocations. Attempted reduction in the ED was unsuccessful and she presents to the OR now for closed reduction under general anesthesia  Past Medical History:  Diagnosis Date   Anemia    CHRONIC   Aortic insufficiency    a. 2D echo 08/29/15: EF 55-60%, mild LVH, diastolic dysfunction, elevated LV filling pressure, mild AI, severe LAE, mild RAE, mild TR, dilated descending thoracic aorta just distal to the takeoff of the left subclavian, measuring 4.1 cm, no pericardial effusion.   Arthritis    OA-SOME BACK AND NECK PAIN,  GOES TO CHIROPRACTOR TWICE A MONTH;  HX OF JOINT REPLACEMENTS    Cancer (HCC)    SKIN CANCERS REMOVED FROM LEGS   CKD (chronic kidney disease), stage II    Depression    Essential hypertension    Gait abnormality 09/08/2017   GERD (gastroesophageal reflux disease)    Low sodium levels    Myocarditis (HCC)    PSVT (paroxysmal supraventricular tachycardia)    Recurrent Pericarditis    RECURRENT    Thoracic aneurysm    a. Followed by [facility] -last seen in 12/2013 w/ plan for f/u MRA and thoracic surgery f/u in 2 yrs, but patient does not wish to follow any longer.    Past Surgical History:  Procedure Laterality Date   APPENDECTOMY     BREAST EXCISIONAL BIOPSY Left 1992   benign   EXCISION/RELEASE BURSA HIP Left 11/24/2012   Procedure: LEFT HIP BURSECTOMY AND TENDON REPAIR ;  Surgeon: Loanne Drilling, MD;  Location: [facility];  Service: Orthopedics;  Laterality: Left;   HIP CLOSED REDUCTION Left 12/19/2012   Procedure: CLOSED MANIPULATION HIP;  Surgeon: Shelda Pal, MD;  Location: [facility];  Service: Orthopedics;  Laterality: Left;   HIP CLOSED REDUCTION Left 12/05/2013   Procedure: CLOSED  MANIPULATION HIP;  Surgeon: Verlee Rossetti, MD;  Location: [facility];  Service: Orthopedics;  Laterality: Left;   HIP CLOSED REDUCTION Left 09/22/2015   Procedure: CLOSED REDUCTION HIP;  Surgeon: Venita Lick, MD;  Location: [facility];  Service: Orthopedics;  Laterality: Left;   JOINT REPLACEMENT     LEFT TOTAL HIP REPLACEMENT AND REVISIONS X 2   OOPHORECTOMY     has a partial of one ovary remaingin   PELVIC LAPAROSCOPY     ovarian cyst removal,    REVISION TOTAL HIP ARTHROPLASTY     left   right Achilles Tendon repair  5/13   TOTAL KNEE ARTHROPLASTY     right   TOTAL KNEE ARTHROPLASTY     left   VAGINAL HYSTERECTOMY     with ovarian cyst removal    Family History  Problem Relation Age of Onset   Hypertension Mother    Stroke Mother    Heart attack Father 35   Heart failure Father    Heart failure Sister    Aortic aneurysm Sister    Aortic aneurysm Sister    Stroke Sister    Pneumonia Brother    Stroke Brother    ALS Other    Hyperlipidemia Daughter    Aortic aneurysm Son    Breast cancer Neg Hx    Social History:  reports that she quit smoking  about 59 years ago. Her smoking use included cigarettes. She started smoking about 64 years ago. She has never used smokeless tobacco. She reports that she does not currently use drugs. She reports that she does not drink alcohol.  Allergies:  Allergies  Allergen Reactions   Cephalexin Other (See Comments)    Pt states this caused eye problems and STROKE-LIKE SYMPTOMS!!   Shellfish-Derived Products Hives and Rash   Clindamycin/Lincomycin Rash   Lincomycin Rash   Clindamycin Hcl Other (See Comments)    "Per pharmacist"   Fosfomycin Other (See Comments)    Disorientation   Levofloxacin Other (See Comments)    Reaction not recalled by the patient   Penicillamine Other (See Comments)    Reaction not recalled by the patient   Sugar-Protein-Starch Other (See Comments)    Sugary foods cause legs to cramp   Tape Other (See Comments)     Please use paper tape or Coban Wrap; patient's skin tears VERY easily!!   Ciprofloxacin Rash   Sulfa Antibiotics Hives and Rash   Sulfites Hives, Swelling and Rash    Medications Prior to Admission  Medication Sig Dispense Refill   allopurinol (ZYLOPRIM) 100 MG tablet Take 50 mg by mouth daily.     B Complex Vitamins (VITAMIN B COMPLEX) TABS Take 1 tablet by mouth daily with breakfast.     carvedilol (COREG) 12.5 MG tablet Take 12.5 mg by mouth 2 (two) times daily with a meal.     Cholecalciferol (VITAMIN D3) 25 MCG (1000 UT) CAPS Take 1,000 Units by mouth daily.     diltiazem (CARDIZEM CD) 240 MG 24 hr capsule Take 1 capsule (240 mg total) by mouth daily. 90 capsule 3   FEROSUL 325 (65 Fe) MG tablet Take 650 mg by mouth daily with breakfast.     furosemide (LASIX) 20 MG tablet Take 1 tablet (20 mg total) by mouth every other day. 45 tablet 3   losartan (COZAAR) 25 MG tablet Take 1 tablet (25 mg total) by mouth daily. 30 tablet 2   MITIGARE 0.6 MG CAPS Take 0.6 mg by mouth in the morning.     Multiple Vitamins-Minerals (SYSTANE ICAPS AREDS2 PO) Take 1 capsule by mouth in the morning and at bedtime.     sodium chloride 1 g tablet Take 1 g by mouth daily.     triamcinolone cream (KENALOG) 0.1 % Apply topically 3 (three) times daily. To areas of psoariatic rash. Do not apply to face. 30 g 0   TYLENOL 500 MG tablet Take 500-1,000 mg by mouth every 6 (six) hours as needed for mild pain or headache.     amoxicillin (AMOXIL) 500 MG capsule Take 2,000 mg by mouth See admin instructions. Take 2,000 mg by mouth one hour prior to dental procedures     carvedilol (COREG) 3.125 MG tablet TAKE ONE TABLET TWICE DAILY (Patient not taking: Reported on 12/05/2022) 180 tablet 3    Results for orders placed or performed during the hospital encounter of 12/05/22 (from the past 48 hour(s))  CBC with Differential     Status: Abnormal   Collection Time: 12/05/22  3:00 AM  Result Value Ref Range   WBC 10.4  4.0 - 10.5 K/uL   RBC 3.22 (L) 3.87 - 5.11 MIL/uL   Hemoglobin 8.9 (L) 12.0 - 15.0 g/dL   HCT 32.4 (L) 40.1 - 02.7 %   MCV 86.3 80.0 - 100.0 fL   MCH 27.6 26.0 - 34.0 pg   MCHC  32.0 30.0 - 36.0 g/dL   RDW 25.9 (H) 56.3 - 87.5 %   Platelets 330 150 - 400 K/uL   nRBC 0.0 0.0 - 0.2 %   Neutrophils Relative % 65 %   Neutro Abs 6.9 1.7 - 7.7 K/uL   Lymphocytes Relative 14 %   Lymphs Abs 1.4 0.7 - 4.0 K/uL   Monocytes Relative 12 %   Monocytes Absolute 1.2 (H) 0.1 - 1.0 K/uL   Eosinophils Relative 6 %   Eosinophils Absolute 0.6 (H) 0.0 - 0.5 K/uL   Basophils Relative 2 %   Basophils Absolute 0.2 (H) 0.0 - 0.1 K/uL   Immature Granulocytes 1 %   Abs Immature Granulocytes 0.07 0.00 - 0.07 K/uL    Comment: Performed at [facility], [address], [place], [place] [address]  Basic metabolic panel     Status: Abnormal   Collection Time: 12/05/22  3:00 AM  Result Value Ref Range   Sodium 134 (L) 135 - 145 mmol/L   Potassium 3.9 3.5 - 5.1 mmol/L   Chloride 104 98 - 111 mmol/L   CO2 21 (L) 22 - 32 mmol/L   Glucose, Bld 105 (H) 70 - 99 mg/dL    Comment: Glucose reference range applies only to samples taken after fasting for at least 8 hours.   BUN 47 (H) 8 - 23 mg/dL   Creatinine, Ser 9.51 0.44 - 1.00 mg/dL   Calcium 8.5 (L) 8.9 - 10.3 mg/dL   GFR, Estimated 56 (L) >60 mL/min    Comment: (NOTE) Calculated using the CKD-EPI Creatinine Equation (2021)    Anion gap 9 5 - 15    Comment: Performed at [facility], [address] [place], [place] [address]   DG Femur Min 2 Views Left  Result Date: 12/05/2022 CLINICAL DATA:  89 year old female with history of dislocation of the left prosthetic fifth status post reduction. EXAM: LEFT FEMUR 2 VIEWS COMPARISON:  Left hip radiograph 12/05/2022. FINDINGS: Postoperative changes of left hip total arthroplasty and left knee total arthroplasty are noted. No periprosthetic fractures are identified. Femoral shaft  appears intact. Femoral head is now dislocated inferior, medial and anteriorly relative to the prosthetic acetabular cup. IMPRESSION: 1. Persistent dislocation of the left prosthetic femoral head relative to the prosthetic acetabular cup, as above. 2. No other acute abnormality of the left femur. Electronically Signed   By: Trudie Reed M.D.   On: 12/05/2022 06:01   DG Pelvis 1-2 Views  Result Date: 12/05/2022 CLINICAL DATA:  89 year old female with history of left hip dislocation status post closed reduction. EXAM: PELVIS - 1-2 VIEW COMPARISON:  12/05/2022 at 3:39 a.m. FINDINGS: Prosthetic femoral head remains dislocated, now inferomedially on this single view examination. IMPRESSION: 1. Persistent now inferomedial dislocation of the prosthetic left femoral head relative to the prosthetic acetabulum. Electronically Signed   By: Trudie Reed M.D.   On: 12/05/2022 05:59   DG Hip Unilat W or Wo Pelvis 2-3 Views Left  Result Date: 12/05/2022 CLINICAL DATA:  89 year old female with history of left hip dislocation after sitting in a chair. EXAM: DG HIP (WITH OR WITHOUT PELVIS) 2-3V LEFT COMPARISON:  09/10/2022. FINDINGS: Patient is status post left hip arthroplasty. No periprosthetic fracture identified. The prosthetic femoral head is dislocated superiorly and laterally relative to the acetabulum, with proximal migration of the femur. Bony pelvic ring appears intact, as do the visualized portions of the right proximal femur. IMPRESSION: 1. Status post left hip arthroplasty with  dislocation of the prosthetic femoral head relative to the prosthetic acetabular cup, as above. Electronically Signed   By: Trudie Reed M.D.   On: 12/05/2022 05:05    Review of Systems  Blood pressure (!) 145/76, pulse 79, temperature (!) 97.5 F (36.4 C), resp. rate 18, height 5' 4.5" (1.638 m), weight 60.4 kg, SpO2 96%. Physical Exam  Physical Examination: General appearance - alert, well appearing, and in no  distress Mental status - alert, oriented to person, place, and time Chest - clear to auscultation, no wheezes, rales or rhonchi, symmetric air entry Heart - normal rate, regular rhythm, normal S1, S2, no murmurs, rubs, clicks or gallops Abdomen - soft, nontender, nondistended, no masses or organomegaly Left leg shortened and internally rotated. Sensation and motor intact   Assessment/Plan Left hip dislocation- Plan closed reduction and discharge home after reduction  Ollen Gross, MD 12/05/2022, 7:24 AM

## 2022-12-05 NOTE — Anesthesia Preprocedure Evaluation (Signed)
Anesthesia Evaluation  Patient identified by MRN, date of birth, ID band Patient awake    Reviewed: Allergy & Precautions, H&P , NPO status , Patient's Chart, lab work & pertinent test results  Airway Mallampati: II  TM Distance: >3 FB Neck ROM: Full    Dental no notable dental hx.    Pulmonary former smoker ILD   Pulmonary exam normal breath sounds clear to auscultation       Cardiovascular hypertension, Normal cardiovascular exam Rhythm:Regular Rate:Normal  TTE: 2024 Moderate AI   Neuro/Psych  PSYCHIATRIC DISORDERS  Depression    negative neurological ROS     GI/Hepatic Neg liver ROS,GERD  ,,  Endo/Other  negative endocrine ROS    Renal/GU Renal disease  negative genitourinary   Musculoskeletal  (+) Arthritis ,    Abdominal   Peds negative pediatric ROS (+)  Hematology  (+) Blood dyscrasia, anemia   Anesthesia Other Findings   Reproductive/Obstetrics negative OB ROS                              Anesthesia Physical Anesthesia Plan  ASA: 3  Anesthesia Plan: MAC   Post-op Pain Management:    Induction: Intravenous  PONV Risk Score and Plan: Ondansetron, TIVA and Treatment may vary due to age or medical condition  Airway Management Planned: Natural Airway  Additional Equipment:   Intra-op Plan:   Post-operative Plan: Extubation in OR  Informed Consent: I have reviewed the patients History and Physical, chart, labs and discussed the procedure including the risks, benefits and alternatives for the proposed anesthesia with the patient or authorized representative who has indicated his/her understanding and acceptance.     Dental advisory given  Plan Discussed with: CRNA  Anesthesia Plan Comments:          Anesthesia Quick Evaluation

## 2022-12-05 NOTE — Op Note (Signed)
NAME: [name], [name] MEDICAL RECORD NO: [id number] ACCOUNT NO: [id number] DATE OF BIRTH: [birth year] FACILITY: [facility] LOCATION: [facility] PHYSICIAN: Gus Rankin. Baden Betsch, MD  Operative Report   DATE OF PROCEDURE: 12/05/2022  PREOPERATIVE DIAGNOSIS:  Left hip prosthetic dislocation.  POSTOPERATIVE DIAGNOSIS:  Left hip prosthetic dislocation.  PROCEDURE:  Closed reduction of left hip dislocation.  SURGEON:  Gus Rankin. Larose Batres, MD  ASSISTANT:  Arther Abbott, PA-C.  ANESTHESIA:  General.  COMPLICATIONS:  None.  CONDITION:  Stable to recovery.  BRIEF CLINICAL NOTE:  The patient is a 89 year old female with complex history regards to her left hip.  She had multiple surgeries and dislocations.  She was tending to her left foot and bent over too far and sustained a posterior dislocation last  night.  Attempted closed reduction in the Emergency Department, was unsuccessful.  She presents now to the operating room for closed reduction under general anesthesia.  DESCRIPTION OF PROCEDURE:  After successful administration of general anesthetic, I applied traction to the leg and was not able to get much movement in it. Under C-arm, it was noted that the hip was essentially behind the obturator foramen.  I had to  internally rotate her and adduct her to be able to get it out from apposition and then with 90-90 traction was able to reduce the hip with an audible reduction.  I then placed it through range of motion with flexion to 90, rotation to 30 in each  direction and then internal and then abduction and adduction and the hip felt reduced.  It was confirmed to be reduced under fluoroscopic guidance.  Note that in the Emergency Department she had sustained significant skin tears on her medial thigh and calf.  They were dressed, but I took the bandage off and we placed tegaderms over the skin tears and then reapplied the sterile dressing and then  placed her into a knee immobilizer and she was  awakened and transported to recovery in stable condition.  Plan will be to follow up in the office in 2 weeks.  She will have posterior hip precautions.    PAA D: 12/05/2022 8:07:25 am T: 12/05/2022 8:59:00 am  JOB: [id number]/ [id number]

## 2022-12-05 NOTE — Progress Notes (Signed)
Case discussed with the ED physician.  Unfortunately, to attempts at closed reduction in the ER were unsuccessful.  Will need to take her up to the OR for adequate relaxation and close reduction maneuver there.  Please keep NPO.

## 2022-12-05 NOTE — ED Provider Notes (Signed)
[place] EMERGENCY DEPARTMENT AT [facility] Provider Note   CSN: [id number] Arrival date & time: 12/05/22  2952     History  Chief Complaint  Patient presents with   Dislocation    [name] is a 89 y.o. female.  Patient presents with concern for left hip dislocation.  States she was sitting in a chair and bent forward and felt her left hip pop out.  Did not fall to the ground.  Not hit her head.  Pain to left hip.  Has had this dislocation several times previously.  Last surgery was several years ago.  Denies any blood thinner use.  Denies any head, neck, back, chest or abdominal pain.  The history is provided by the patient and the EMS personnel.       Home Medications Prior to Admission medications   Medication Sig Start Date End Date Taking? Authorizing Provider  amoxicillin (AMOXIL) 500 MG capsule Take 2,000 mg by mouth See admin instructions. Take 2,000 mg by mouth one hour prior to dental procedures    [provider]  B Complex Vitamins (VITAMIN B COMPLEX) TABS Take 1 tablet by mouth daily with breakfast.    [provider]  carvedilol (COREG) 12.5 MG tablet Take 3.125 mg by mouth 2 (two) times daily with a meal.    [provider]  carvedilol (COREG) 3.125 MG tablet TAKE ONE TABLET TWICE DAILY 10/29/22   Tereso Newcomer T, PA-C  Cholecalciferol (VITAMIN D3) 25 MCG (1000 UT) CAPS Take 1,000 Units by mouth daily.    [provider]  diltiazem (CARDIZEM CD) 240 MG 24 hr capsule Take 1 capsule (240 mg total) by mouth daily. 10/27/21   Lyn Records, MD  FEROSUL 325 (65 Fe) MG tablet Take 650 mg by mouth daily with breakfast.    [provider]  furosemide (LASIX) 20 MG tablet Take 1 tablet (20 mg total) by mouth every other day. 04/11/22 04/11/23  Pokhrel, Rebekah Chesterfield, MD  losartan (COZAAR) 25 MG tablet Take 1 tablet (25 mg total) by mouth daily. 04/11/22 10/20/22  Pokhrel, Laxman, MD  MITIGARE 0.6 MG CAPS Take 0.6 mg by  mouth in the morning.    [provider]  Multiple Vitamins-Minerals (SYSTANE ICAPS AREDS2 PO) Take 1 capsule by mouth in the morning and at bedtime.    [provider]  NONFORMULARY OR COMPOUNDED ITEM Apply 1 application  topically See admin instructions. Triamcinolone 0.1% cream/Cetaphil compounded cream- Apply to the back, legs, and arms 2 times a day as directed    [provider]  sodium chloride 1 g tablet Take 1 g by mouth daily. 08/21/19   [provider]  triamcinolone cream (KENALOG) 0.1 % Apply topically 3 (three) times daily. To areas of psoariatic rash. Do not apply to face. 10/25/22   Johney Maine, MD  TYLENOL 500 MG tablet Take 500-1,000 mg by mouth every 6 (six) hours as needed for mild pain or headache.    [provider]      Allergies    Cephalexin, Shellfish-derived products, Clindamycin/lincomycin, Lincomycin, Clindamycin hcl, Fosfomycin, Levofloxacin, Penicillamine, Sugar-protein-starch, Tape, Ciprofloxacin, Sulfa antibiotics, and Sulfites    Review of Systems   Review of Systems  Constitutional:  Negative for activity change, appetite change and fever.  HENT:  Negative for congestion and rhinorrhea.   Respiratory:  Negative for cough, chest tightness and shortness of breath.   Cardiovascular:  Negative for chest pain.  Gastrointestinal:  Negative for  abdominal pain, nausea and vomiting.  Genitourinary:  Negative for dysuria and hematuria.  Musculoskeletal:  Positive for arthralgias and myalgias.  Skin:  Negative for rash.  Neurological:  Negative for weakness and headaches.   all other systems are negative except as noted in the HPI and PMH.    Physical Exam Updated Vital Signs BP (!) 179/86   Pulse 91   Temp 97.9 F (36.6 C) (Oral)   Resp (!) 26   Ht 5' 4.5" (1.638 m)   Wt 60.4 kg   SpO2 97%   BMI 22.50 kg/m  Physical Exam Vitals and nursing note reviewed.  Constitutional:      General: She is not in  acute distress.    Appearance: She is well-developed.  HENT:     Head: Normocephalic and atraumatic.     Mouth/Throat:     Pharynx: No oropharyngeal exudate.  Eyes:     Conjunctiva/sclera: Conjunctivae normal.     Pupils: Pupils are equal, round, and reactive to light.  Neck:     Comments: No meningismus. Cardiovascular:     Rate and Rhythm: Normal rate and regular rhythm.     Heart sounds: Normal heart sounds. No murmur heard. Pulmonary:     Effort: Pulmonary effort is normal. No respiratory distress.     Breath sounds: Normal breath sounds.  Abdominal:     Palpations: Abdomen is soft.     Tenderness: There is no abdominal tenderness. There is no guarding or rebound.  Musculoskeletal:        General: Swelling, tenderness and deformity present.     Cervical back: Normal range of motion and neck supple.     Comments: Left hip is shortened and externally rotated.  Intact DP and PT pulse.  Able to wiggle toes.  Right thigh has small skin tear from EMS manipulation.  Skin:    General: Skin is warm.  Neurological:     Mental Status: She is alert and oriented to person, place, and time.     Cranial Nerves: No cranial nerve deficit.     Motor: No abnormal muscle tone.     Coordination: Coordination normal.     Comments:  5/5 strength throughout. CN 2-12 intact.Equal grip strength.   Psychiatric:        Behavior: Behavior normal.     ED Results / Procedures / Treatments   Labs (all labs ordered are listed, but only abnormal results are displayed) Labs Reviewed  CBC WITH DIFFERENTIAL/PLATELET - Abnormal; Notable for the following components:      Result Value   RBC 3.22 (*)    Hemoglobin 8.9 (*)    HCT 27.8 (*)    RDW 15.7 (*)    Monocytes Absolute 1.2 (*)    Eosinophils Absolute 0.6 (*)    Basophils Absolute 0.2 (*)    All other components within normal limits  BASIC METABOLIC PANEL - Abnormal; Notable for the following components:   Sodium 134 (*)    CO2 21 (*)     Glucose, Bld 105 (*)    BUN 47 (*)    Calcium 8.5 (*)    GFR, Estimated 56 (*)    All other components within normal limits    EKG None  Radiology DG Femur Min 2 Views Left  Result Date: 12/05/2022 CLINICAL DATA:  89 year old female with history of dislocation of the left prosthetic fifth status post reduction. EXAM: LEFT FEMUR 2 VIEWS COMPARISON:  Left hip radiograph 12/05/2022. FINDINGS: Postoperative changes  of left hip total arthroplasty and left knee total arthroplasty are noted. No periprosthetic fractures are identified. Femoral shaft appears intact. Femoral head is now dislocated inferior, medial and anteriorly relative to the prosthetic acetabular cup. IMPRESSION: 1. Persistent dislocation of the left prosthetic femoral head relative to the prosthetic acetabular cup, as above. 2. No other acute abnormality of the left femur. Electronically Signed   By: Trudie Reed M.D.   On: 12/05/2022 06:01   DG Pelvis 1-2 Views  Result Date: 12/05/2022 CLINICAL DATA:  89 year old female with history of left hip dislocation status post closed reduction. EXAM: PELVIS - 1-2 VIEW COMPARISON:  12/05/2022 at 3:39 a.m. FINDINGS: Prosthetic femoral head remains dislocated, now inferomedially on this single view examination. IMPRESSION: 1. Persistent now inferomedial dislocation of the prosthetic left femoral head relative to the prosthetic acetabulum. Electronically Signed   By: Trudie Reed M.D.   On: 12/05/2022 05:59   DG Hip Unilat W or Wo Pelvis 2-3 Views Left  Result Date: 12/05/2022 CLINICAL DATA:  89 year old female with history of left hip dislocation after sitting in a chair. EXAM: DG HIP (WITH OR WITHOUT PELVIS) 2-3V LEFT COMPARISON:  09/10/2022. FINDINGS: Patient is status post left hip arthroplasty. No periprosthetic fracture identified. The prosthetic femoral head is dislocated superiorly and laterally relative to the acetabulum, with proximal migration of the femur. Bony pelvic ring  appears intact, as do the visualized portions of the right proximal femur. IMPRESSION: 1. Status post left hip arthroplasty with dislocation of the prosthetic femoral head relative to the prosthetic acetabular cup, as above. Electronically Signed   By: Trudie Reed M.D.   On: 12/05/2022 05:05    Procedures .Sedation  Date/Time: 12/05/2022 4:31 AM  Performed by: Glynn Octave, MD Authorized by: Glynn Octave, MD   Consent:    Consent given by:  Patient   Risks discussed:  Allergic reaction, inadequate sedation, nausea, respiratory compromise necessitating ventilatory assistance and intubation, prolonged sedation necessitating reversal, prolonged hypoxia resulting in organ damage, dysrhythmia and vomiting   Alternatives discussed:  Analgesia without sedation Universal protocol:    Procedure explained and questions answered to patient or proxy's satisfaction: yes     Relevant documents present and verified: yes     Test results available: yes     Imaging studies available: yes     Required blood products, implants, devices, and special equipment available: yes     Site/side marked: yes     Immediately prior to procedure, a time out was called: yes     Patient identity confirmed:  Anonymous protocol, patient vented/unresponsive Indications:    Procedure performed:  Dislocation reduction Pre-sedation assessment:    Time since last food or drink:  6   ASA classification: class 2 - patient with mild systemic disease     Mouth opening:  3 or more finger widths   Thyromental distance:  4 finger widths   Mallampati score:  I - soft palate, uvula, fauces, pillars visible   Neck mobility: normal     Pre-sedation assessments completed and reviewed: airway patency, cardiovascular function, hydration status, mental status, nausea/vomiting, pain level, respiratory function and temperature     Pre-sedation assessment completed:  12/05/2022 4:00 AM Immediate pre-procedure details:     Reassessment: Patient reassessed immediately prior to procedure     Reviewed: vital signs, relevant labs/tests and NPO status     Verified: bag valve mask available, emergency equipment available, intubation equipment available, IV patency confirmed, oxygen available, reversal medications available and  suction available   Procedure details (see MAR for exact dosages):    Preoxygenation:  Nasal cannula   Sedation:  Ketamine and propofol   Intended level of sedation: deep   Analgesia:  Hydromorphone   Intra-procedure monitoring:  Blood pressure monitoring, continuous capnometry, frequent LOC assessments, frequent vital sign checks, continuous pulse oximetry and cardiac monitor   Intra-procedure events: none     Total Provider sedation time (minutes):  25 Post-procedure details:    Post-sedation assessment completed:  12/05/2022 4:32 AM   Attendance: Constant attendance by certified staff until patient recovered     Recovery: Patient returned to pre-procedure baseline     Post-sedation assessments completed and reviewed: airway patency, cardiovascular function, hydration status, mental status, nausea/vomiting, pain level, respiratory function and temperature     Patient is stable for discharge or admission: yes     Procedure completion:  Tolerated well, no immediate complications Reduction of dislocation  Date/Time: 12/05/2022 4:32 AM  Performed by: Glynn Octave, MD Authorized by: Glynn Octave, MD  Consent: Written consent obtained. Risks and benefits: risks, benefits and alternatives were discussed Consent given by: patient Patient understanding: patient states understanding of the procedure being performed Patient consent: the patient's understanding of the procedure matches consent given Procedure consent: procedure consent matches procedure scheduled Relevant documents: relevant documents present and verified Test results: test results available and properly labeled Site marked:  the operative site was marked Imaging studies: imaging studies available Required items: required blood products, implants, devices, and special equipment available Patient identity confirmed: verbally with patient Time out: Immediately prior to procedure a "time out" was called to verify the correct patient, procedure, equipment, support staff and site/side marked as required. Preparation: Patient was prepped and draped in the usual sterile fashion. Local anesthesia used: no  Anesthesia: Local anesthesia used: no  Sedation: Patient sedated: yes Sedation type: moderate (conscious) sedation Sedatives: propofol and ketamine Analgesia: hydromorphone Sedation start date/time: 12/05/2022 4:00 AM Sedation end date/time: 12/05/2022 4:25 AM Vitals: Vital signs were monitored during sedation.  Patient tolerance: patient tolerated the procedure well with no immediate complications       Medications Ordered in ED Medications  propofol (DIPRIVAN) 10 mg/mL bolus/IV push 30.2 mg (has no administration in time range)  ketamine 50 mg in normal saline 5 mL (10 mg/mL) syringe (has no administration in time range)  HYDROmorphone (DILAUDID) injection 0.5 mg (has no administration in time range)    ED Course/ Medical Decision Making/ A&P                                 Medical Decision Making Amount and/or Complexity of Data Reviewed Independent Historian: EMS Labs: ordered. Decision-making details documented in ED Course. Radiology: ordered and independent interpretation performed. Decision-making details documented in ED Course. ECG/medicine tests: ordered and independent interpretation performed. Decision-making details documented in ED Course.  Risk Prescription drug management. Decision regarding hospitalization.   Left hip dislocation.  No head injury or trauma.  Vital signs stable.  Neurovascularly intact.  Patient with left superior hip dislocation.  Patient consented for  sedation and reduction as above.  Sedation performed.  Hip was very difficult to reduce.  Multiple attempts were made with multiple techniques including traction and countertraction.  Patient's skin is very thin and she did sustain some skin tears with manipulation of her leg. Skin tears were cleaned and repaired with Steri-Strips.  Postreduction x-ray shows that dislocation has  moved from lateral to medial.  Still dislocated.  Remains neurovascularly intact.  Intact distal pulses.  Discussed with Dr. Aundria Rud who is covering for Dr. Lequita Halt.  He will take patient to the OR for closed reduction.  Patient updated.  Additional pain medication given.        Final Clinical Impression(s) / ED Diagnoses Final diagnoses:  Dislocation of left hip, initial encounter [facility])    Rx / DC Orders ED Discharge Orders     None         Janeal Abadi, Jeannett Senior, MD 12/05/22 [phone]

## 2022-12-05 NOTE — Interval H&P Note (Signed)
History and Physical Interval Note:  12/05/2022 7:24 AM  [name]  has presented today for surgery, with the diagnosis of left prosthetic hip dislocation.  The various methods of treatment have been discussed with the patient and family. After consideration of risks, benefits and other options for treatment, the patient has consented to  Procedure(s): CLOSED MANIPULATION HIP (Left) as a surgical intervention.  The patient's history has been reviewed, patient examined, no change in status, stable for surgery.  I have reviewed the patient's chart and labs.  Questions were answered to the patient's satisfaction.     Homero Fellers Kenzie Flakes

## 2022-12-05 NOTE — Discharge Instructions (Signed)
   Dr. Ollen Gross Total Joint Specialist [facility] [address] [place], [place] [address] [phone]  POSTERIOR TOTAL HIP PRECAUTIONS  PRECAUTIONS (6 WEEKS FOLLOWING SURGERY) Do not bend your hip past a 90 degree angle Do not cross your legs. Don't twist your hip inwards- keep knees and toes pointed upwards    Maintain knee immobilizer at all times  Follow-up in our office in 2 weeks  Leave dressings on the left lower leg until you are seen in our office

## 2022-12-06 ENCOUNTER — Emergency Department (HOSPITAL_COMMUNITY): Payer: Medicare Other

## 2022-12-06 ENCOUNTER — Inpatient Hospital Stay (HOSPITAL_COMMUNITY)
Admission: EM | Admit: 2022-12-06 | Discharge: 2022-12-10 | DRG: 640 | Disposition: A | Discharge status: Skilled Nursing Facility | Payer: Medicare Other | Source: Skilled Nursing Facility | Attending: Internal Medicine | Admitting: Internal Medicine

## 2022-12-06 ENCOUNTER — Other Ambulatory Visit: Payer: Self-pay

## 2022-12-06 ENCOUNTER — Encounter (HOSPITAL_COMMUNITY): Payer: Self-pay | Admitting: Orthopedic Surgery

## 2022-12-06 DIAGNOSIS — I10 Essential (primary) hypertension: Secondary | ICD-10-CM | POA: Diagnosis present

## 2022-12-06 DIAGNOSIS — I6782 Cerebral ischemia: Secondary | ICD-10-CM | POA: Diagnosis not present

## 2022-12-06 DIAGNOSIS — Z96642 Presence of left artificial hip joint: Secondary | ICD-10-CM | POA: Diagnosis present

## 2022-12-06 DIAGNOSIS — T148XXA Other injury of unspecified body region, initial encounter: Secondary | ICD-10-CM | POA: Diagnosis present

## 2022-12-06 DIAGNOSIS — T50995A Adverse effect of other drugs, medicaments and biological substances, initial encounter: Secondary | ICD-10-CM | POA: Diagnosis present

## 2022-12-06 DIAGNOSIS — Z83438 Family history of other disorder of lipoprotein metabolism and other lipidemia: Secondary | ICD-10-CM

## 2022-12-06 DIAGNOSIS — J8410 Pulmonary fibrosis, unspecified: Secondary | ICD-10-CM | POA: Diagnosis not present

## 2022-12-06 DIAGNOSIS — Z9102 Food additives allergy status: Secondary | ICD-10-CM

## 2022-12-06 DIAGNOSIS — M79604 Pain in right leg: Secondary | ICD-10-CM | POA: Diagnosis not present

## 2022-12-06 DIAGNOSIS — Z96653 Presence of artificial knee joint, bilateral: Secondary | ICD-10-CM | POA: Diagnosis present

## 2022-12-06 DIAGNOSIS — E871 Hypo-osmolality and hyponatremia: Secondary | ICD-10-CM | POA: Diagnosis present

## 2022-12-06 DIAGNOSIS — R609 Edema, unspecified: Secondary | ICD-10-CM | POA: Diagnosis not present

## 2022-12-06 DIAGNOSIS — Z8489 Family history of other specified conditions: Secondary | ICD-10-CM

## 2022-12-06 DIAGNOSIS — Z832 Family history of diseases of the blood and blood-forming organs and certain disorders involving the immune mechanism: Secondary | ICD-10-CM

## 2022-12-06 DIAGNOSIS — I130 Hypertensive heart and chronic kidney disease with heart failure and stage 1 through stage 4 chronic kidney disease, or unspecified chronic kidney disease: Secondary | ICD-10-CM | POA: Diagnosis present

## 2022-12-06 DIAGNOSIS — Z91048 Other nonmedicinal substance allergy status: Secondary | ICD-10-CM

## 2022-12-06 DIAGNOSIS — Z79899 Other long term (current) drug therapy: Secondary | ICD-10-CM

## 2022-12-06 DIAGNOSIS — Z8249 Family history of ischemic heart disease and other diseases of the circulatory system: Secondary | ICD-10-CM

## 2022-12-06 DIAGNOSIS — Z85828 Personal history of other malignant neoplasm of skin: Secondary | ICD-10-CM

## 2022-12-06 DIAGNOSIS — N19 Unspecified kidney failure: Secondary | ICD-10-CM | POA: Diagnosis present

## 2022-12-06 DIAGNOSIS — D72829 Elevated white blood cell count, unspecified: Secondary | ICD-10-CM | POA: Diagnosis present

## 2022-12-06 DIAGNOSIS — E860 Dehydration: Principal | ICD-10-CM | POA: Diagnosis present

## 2022-12-06 DIAGNOSIS — Z87891 Personal history of nicotine dependence: Secondary | ICD-10-CM

## 2022-12-06 DIAGNOSIS — R531 Weakness: Secondary | ICD-10-CM

## 2022-12-06 DIAGNOSIS — Z882 Allergy status to sulfonamides status: Secondary | ICD-10-CM

## 2022-12-06 DIAGNOSIS — G9340 Encephalopathy, unspecified: Secondary | ICD-10-CM | POA: Diagnosis present

## 2022-12-06 DIAGNOSIS — M6282 Rhabdomyolysis: Secondary | ICD-10-CM | POA: Diagnosis present

## 2022-12-06 DIAGNOSIS — R4182 Altered mental status, unspecified: Secondary | ICD-10-CM | POA: Diagnosis not present

## 2022-12-06 DIAGNOSIS — D638 Anemia in other chronic diseases classified elsewhere: Secondary | ICD-10-CM | POA: Diagnosis present

## 2022-12-06 DIAGNOSIS — L2489 Irritant contact dermatitis due to other agents: Secondary | ICD-10-CM | POA: Diagnosis not present

## 2022-12-06 DIAGNOSIS — Z751 Person awaiting admission to adequate facility elsewhere: Secondary | ICD-10-CM

## 2022-12-06 DIAGNOSIS — I5032 Chronic diastolic (congestive) heart failure: Secondary | ICD-10-CM | POA: Diagnosis present

## 2022-12-06 DIAGNOSIS — Z66 Do not resuscitate: Secondary | ICD-10-CM | POA: Diagnosis present

## 2022-12-06 DIAGNOSIS — R410 Disorientation, unspecified: Principal | ICD-10-CM

## 2022-12-06 DIAGNOSIS — M79605 Pain in left leg: Secondary | ICD-10-CM | POA: Diagnosis not present

## 2022-12-06 DIAGNOSIS — T84021A Dislocation of internal left hip prosthesis, initial encounter: Secondary | ICD-10-CM | POA: Diagnosis present

## 2022-12-06 DIAGNOSIS — I351 Nonrheumatic aortic (valve) insufficiency: Secondary | ICD-10-CM | POA: Diagnosis present

## 2022-12-06 DIAGNOSIS — D631 Anemia in chronic kidney disease: Secondary | ICD-10-CM | POA: Diagnosis present

## 2022-12-06 DIAGNOSIS — Z91013 Allergy to seafood: Secondary | ICD-10-CM

## 2022-12-06 DIAGNOSIS — Z23 Encounter for immunization: Secondary | ICD-10-CM

## 2022-12-06 DIAGNOSIS — Z823 Family history of stroke: Secondary | ICD-10-CM

## 2022-12-06 DIAGNOSIS — Z881 Allergy status to other antibiotic agents status: Secondary | ICD-10-CM

## 2022-12-06 DIAGNOSIS — N182 Chronic kidney disease, stage 2 (mild): Secondary | ICD-10-CM | POA: Diagnosis present

## 2022-12-06 DIAGNOSIS — G9341 Metabolic encephalopathy: Secondary | ICD-10-CM | POA: Diagnosis present

## 2022-12-06 DIAGNOSIS — Z880 Allergy status to penicillin: Secondary | ICD-10-CM

## 2022-12-06 LAB — URINALYSIS, ROUTINE W REFLEX MICROSCOPIC
Bacteria, UA: NONE SEEN
Bilirubin Urine: NEGATIVE
Glucose, UA: NEGATIVE mg/dL
Hgb urine dipstick: NEGATIVE
Ketones, ur: NEGATIVE mg/dL
Nitrite: NEGATIVE
Protein, ur: 30 mg/dL — AB
Specific Gravity, Urine: 1.019 (ref 1.005–1.030)
pH: 5 (ref 5.0–8.0)

## 2022-12-06 LAB — BASIC METABOLIC PANEL
Anion gap: 9 (ref 5–15)
BUN: 35 mg/dL — ABNORMAL HIGH (ref 8–23)
CO2: 22 mmol/L (ref 22–32)
Calcium: 8.4 mg/dL — ABNORMAL LOW (ref 8.9–10.3)
Chloride: 102 mmol/L (ref 98–111)
Creatinine, Ser: 1.21 mg/dL — ABNORMAL HIGH (ref 0.44–1.00)
GFR, Estimated: 43 mL/min — ABNORMAL LOW (ref >60)
Glucose, Bld: 106 mg/dL — ABNORMAL HIGH (ref 70–99)
Potassium: 4 mmol/L (ref 3.5–5.1)
Sodium: 133 mmol/L — ABNORMAL LOW (ref 135–145)

## 2022-12-06 LAB — CBC WITH DIFFERENTIAL/PLATELET
Abs Immature Granulocytes: 0.08 10*3/uL — ABNORMAL HIGH (ref 0.00–0.07)
Basophils Absolute: 0.1 10*3/uL (ref 0.0–0.1)
Basophils Relative: 1 %
Eosinophils Absolute: 0.2 10*3/uL (ref 0.0–0.5)
Eosinophils Relative: 2 %
HCT: 24.8 % — ABNORMAL LOW (ref 36.0–46.0)
Hemoglobin: 7.8 g/dL — ABNORMAL LOW (ref 12.0–15.0)
Immature Granulocytes: 1 %
Lymphocytes Relative: 15 %
Lymphs Abs: 1.6 10*3/uL (ref 0.7–4.0)
MCH: 27.3 pg (ref 26.0–34.0)
MCHC: 31.5 g/dL (ref 30.0–36.0)
MCV: 86.7 fL (ref 80.0–100.0)
Monocytes Absolute: 1.5 10*3/uL — ABNORMAL HIGH (ref 0.1–1.0)
Monocytes Relative: 13 %
Neutro Abs: 7.7 10*3/uL (ref 1.7–7.7)
Neutrophils Relative %: 68 %
Platelets: 281 10*3/uL (ref 150–400)
RBC: 2.86 MIL/uL — ABNORMAL LOW (ref 3.87–5.11)
RDW: 15.8 % — ABNORMAL HIGH (ref 11.5–15.5)
WBC: 11.3 10*3/uL — ABNORMAL HIGH (ref 4.0–10.5)
nRBC: 0 % (ref 0.0–0.2)

## 2022-12-06 NOTE — ED Notes (Signed)
When the patient was asked why she was here, she responded that she didn't know and to call her son to find out.

## 2022-12-06 NOTE — ED Triage Notes (Signed)
Patient present from [facility] due to bilateral leg weeping and skin irritation. She was discharged from the hospital yesterday. She recently had a left knee replacement.

## 2022-12-06 NOTE — ED Provider Notes (Signed)
[place] EMERGENCY DEPARTMENT AT [facility] Provider Note   CSN: [id number] Arrival date & time: 12/06/22  1910     History {Add pertinent medical, surgical, social history, OB history to HPI:1} Chief Complaint  Patient presents with   Leg Pain    [name] is a 89 y.o. female.   Leg Pain This is a 89 year old female history of CKD, hypertension, depression, GERD, right hip dislocation presenting for leg wound.  Patient states she is not sure why she is here.  She states she lives in [place] and was discharged yesterday after she had a left hip replacement.  She states she generally does not feel well since being in the hospital for her hip relocation but otherwise has been okay.  She thinks the wounds are from where they had to relocate her hip.  She is not sure why the home brought her here.  Per chart review she was seen in the ED yesterday and has significant skin tears from where they tried to relocate her hip.  Tegaderms were applied.  Patient thinks this is why she has the skin tear.  She is not any fevers or chills.  She is mild pain to her left leg.     Home Medications Prior to Admission medications   Medication Sig Start Date End Date Taking? Authorizing Provider  allopurinol (ZYLOPRIM) 100 MG tablet Take 50 mg by mouth daily.    [provider]  amoxicillin (AMOXIL) 500 MG capsule Take 2,000 mg by mouth See admin instructions. Take 2,000 mg by mouth one hour prior to dental procedures    [provider]  B Complex Vitamins (VITAMIN B COMPLEX) TABS Take 1 tablet by mouth daily with breakfast.    [provider]  carvedilol (COREG) 12.5 MG tablet Take 12.5 mg by mouth 2 (two) times daily with a meal.    [provider]  carvedilol (COREG) 3.125 MG tablet TAKE ONE TABLET TWICE DAILY Patient not taking: Reported on 12/05/2022 10/29/22   Tereso Newcomer T, PA-C  Cholecalciferol (VITAMIN D3) 25 MCG (1000 UT) CAPS Take  1,000 Units by mouth daily.    [provider]  diltiazem (CARDIZEM CD) 240 MG 24 hr capsule Take 1 capsule (240 mg total) by mouth daily. 10/27/21   Lyn Records, MD  FEROSUL 325 (65 Fe) MG tablet Take 650 mg by mouth daily with breakfast.    [provider]  furosemide (LASIX) 20 MG tablet Take 1 tablet (20 mg total) by mouth every other day. 04/11/22 04/11/23  Pokhrel, Rebekah Chesterfield, MD  losartan (COZAAR) 25 MG tablet Take 1 tablet (25 mg total) by mouth daily. 04/11/22 12/05/22  Pokhrel, Laxman, MD  MITIGARE 0.6 MG CAPS Take 0.6 mg by mouth in the morning.    [provider]  Multiple Vitamins-Minerals (SYSTANE ICAPS AREDS2 PO) Take 1 capsule by mouth in the morning and at bedtime.    [provider]  sodium chloride 1 g tablet Take 1 g by mouth daily. 08/21/19   [provider]  triamcinolone cream (KENALOG) 0.1 % Apply topically 3 (three) times daily. To areas of psoariatic rash. Do not apply to face. 10/25/22   Johney Maine, MD  TYLENOL 500 MG tablet Take 500-1,000 mg by mouth every 6 (six) hours as needed for mild pain or headache.    [provider]      Allergies    Cephalexin, Shellfish-derived products, Clindamycin/lincomycin, Lincomycin, Clindamycin hcl, Fosfomycin, Levofloxacin,  Penicillamine, Sugar-protein-starch, Tape, Ciprofloxacin, Sulfa antibiotics, and Sulfites    Review of Systems   Review of Systems  Physical Exam Updated Vital Signs BP (!) 157/83   Pulse 91   SpO2 98%  Physical Exam      ED Results / Procedures / Treatments   Labs (all labs ordered are listed, but only abnormal results are displayed) Labs Reviewed - No data to display  EKG None  Radiology DG C-Arm 1-60 Min-No Report  Result Date: 12/05/2022 Fluoroscopy was utilized by the requesting physician.  No radiographic interpretation.   DG Femur Min 2 Views Left  Result Date: 12/05/2022 CLINICAL DATA:  89 year old female with history of  dislocation of the left prosthetic fifth status post reduction. EXAM: LEFT FEMUR 2 VIEWS COMPARISON:  Left hip radiograph 12/05/2022. FINDINGS: Postoperative changes of left hip total arthroplasty and left knee total arthroplasty are noted. No periprosthetic fractures are identified. Femoral shaft appears intact. Femoral head is now dislocated inferior, medial and anteriorly relative to the prosthetic acetabular cup. IMPRESSION: 1. Persistent dislocation of the left prosthetic femoral head relative to the prosthetic acetabular cup, as above. 2. No other acute abnormality of the left femur. Electronically Signed   By: Trudie Reed M.D.   On: 12/05/2022 06:01   DG Pelvis 1-2 Views  Result Date: 12/05/2022 CLINICAL DATA:  89 year old female with history of left hip dislocation status post closed reduction. EXAM: PELVIS - 1-2 VIEW COMPARISON:  12/05/2022 at 3:39 a.m. FINDINGS: Prosthetic femoral head remains dislocated, now inferomedially on this single view examination. IMPRESSION: 1. Persistent now inferomedial dislocation of the prosthetic left femoral head relative to the prosthetic acetabulum. Electronically Signed   By: Trudie Reed M.D.   On: 12/05/2022 05:59   DG Hip Unilat W or Wo Pelvis 2-3 Views Left  Result Date: 12/05/2022 CLINICAL DATA:  89 year old female with history of left hip dislocation after sitting in a chair. EXAM: DG HIP (WITH OR WITHOUT PELVIS) 2-3V LEFT COMPARISON:  09/10/2022. FINDINGS: Patient is status post left hip arthroplasty. No periprosthetic fracture identified. The prosthetic femoral head is dislocated superiorly and laterally relative to the acetabulum, with proximal migration of the femur. Bony pelvic ring appears intact, as do the visualized portions of the right proximal femur. IMPRESSION: 1. Status post left hip arthroplasty with dislocation of the prosthetic femoral head relative to the prosthetic acetabular cup, as above. Electronically Signed   By: Trudie Reed M.D.   On: 12/05/2022 05:05    Procedures Procedures  {Document cardiac monitor, telemetry assessment procedure when appropriate:1}  Medications Ordered in ED Medications - No data to display  ED Course/ Medical Decision Making/ A&P   {   Click here for ABCD2, HEART and other calculatorsREFRESH Note before signing :1}                              Medical Decision Making Amount and/or Complexity of Data Reviewed Labs: ordered.   ***  {Document critical care time when appropriate:1} {Document review of labs and clinical decision tools ie heart score, Chads2Vasc2 etc:1}  {Document your independent review of radiology images, and any outside records:1} {Document your discussion with family members, caretakers, and with consultants:1} {Document social determinants of health affecting pt's care:1} {Document your decision making why or why not admission, treatments were needed:1} Final Clinical Impression(s) / ED Diagnoses Final diagnoses:  None    Rx / DC Orders ED Discharge Orders  None

## 2022-12-07 ENCOUNTER — Encounter (HOSPITAL_COMMUNITY): Payer: Self-pay | Admitting: Internal Medicine

## 2022-12-07 DIAGNOSIS — Z8249 Family history of ischemic heart disease and other diseases of the circulatory system: Secondary | ICD-10-CM | POA: Diagnosis not present

## 2022-12-07 DIAGNOSIS — I351 Nonrheumatic aortic (valve) insufficiency: Secondary | ICD-10-CM | POA: Diagnosis present

## 2022-12-07 DIAGNOSIS — Z96653 Presence of artificial knee joint, bilateral: Secondary | ICD-10-CM | POA: Diagnosis present

## 2022-12-07 DIAGNOSIS — Z91048 Other nonmedicinal substance allergy status: Secondary | ICD-10-CM | POA: Diagnosis not present

## 2022-12-07 DIAGNOSIS — M064 Inflammatory polyarthropathy: Secondary | ICD-10-CM | POA: Diagnosis not present

## 2022-12-07 DIAGNOSIS — E871 Hypo-osmolality and hyponatremia: Secondary | ICD-10-CM | POA: Diagnosis present

## 2022-12-07 DIAGNOSIS — M6281 Muscle weakness (generalized): Secondary | ICD-10-CM | POA: Diagnosis not present

## 2022-12-07 DIAGNOSIS — G9340 Encephalopathy, unspecified: Secondary | ICD-10-CM | POA: Diagnosis present

## 2022-12-07 DIAGNOSIS — R531 Weakness: Secondary | ICD-10-CM | POA: Diagnosis not present

## 2022-12-07 DIAGNOSIS — M47812 Spondylosis without myelopathy or radiculopathy, cervical region: Secondary | ICD-10-CM | POA: Diagnosis not present

## 2022-12-07 DIAGNOSIS — D638 Anemia in other chronic diseases classified elsewhere: Secondary | ICD-10-CM

## 2022-12-07 DIAGNOSIS — N182 Chronic kidney disease, stage 2 (mild): Secondary | ICD-10-CM | POA: Diagnosis present

## 2022-12-07 DIAGNOSIS — E860 Dehydration: Secondary | ICD-10-CM | POA: Diagnosis present

## 2022-12-07 DIAGNOSIS — Z91013 Allergy to seafood: Secondary | ICD-10-CM | POA: Diagnosis not present

## 2022-12-07 DIAGNOSIS — G9341 Metabolic encephalopathy: Secondary | ICD-10-CM | POA: Diagnosis present

## 2022-12-07 DIAGNOSIS — T148XXA Other injury of unspecified body region, initial encounter: Secondary | ICD-10-CM | POA: Diagnosis not present

## 2022-12-07 DIAGNOSIS — M1990 Unspecified osteoarthritis, unspecified site: Secondary | ICD-10-CM | POA: Diagnosis not present

## 2022-12-07 DIAGNOSIS — Z882 Allergy status to sulfonamides status: Secondary | ICD-10-CM | POA: Diagnosis not present

## 2022-12-07 DIAGNOSIS — J849 Interstitial pulmonary disease, unspecified: Secondary | ICD-10-CM | POA: Diagnosis not present

## 2022-12-07 DIAGNOSIS — I5032 Chronic diastolic (congestive) heart failure: Secondary | ICD-10-CM | POA: Diagnosis present

## 2022-12-07 DIAGNOSIS — N19 Unspecified kidney failure: Secondary | ICD-10-CM | POA: Diagnosis present

## 2022-12-07 DIAGNOSIS — I130 Hypertensive heart and chronic kidney disease with heart failure and stage 1 through stage 4 chronic kidney disease, or unspecified chronic kidney disease: Secondary | ICD-10-CM | POA: Diagnosis present

## 2022-12-07 DIAGNOSIS — M6282 Rhabdomyolysis: Secondary | ICD-10-CM | POA: Diagnosis present

## 2022-12-07 DIAGNOSIS — D72829 Elevated white blood cell count, unspecified: Secondary | ICD-10-CM | POA: Diagnosis present

## 2022-12-07 DIAGNOSIS — T84020S Dislocation of internal right hip prosthesis, sequela: Secondary | ICD-10-CM | POA: Diagnosis not present

## 2022-12-07 DIAGNOSIS — Z9102 Food additives allergy status: Secondary | ICD-10-CM | POA: Diagnosis not present

## 2022-12-07 DIAGNOSIS — F4325 Adjustment disorder with mixed disturbance of emotions and conduct: Secondary | ICD-10-CM | POA: Diagnosis not present

## 2022-12-07 DIAGNOSIS — F332 Major depressive disorder, recurrent severe without psychotic features: Secondary | ICD-10-CM | POA: Diagnosis not present

## 2022-12-07 DIAGNOSIS — M5030 Other cervical disc degeneration, unspecified cervical region: Secondary | ICD-10-CM | POA: Diagnosis not present

## 2022-12-07 DIAGNOSIS — I10 Essential (primary) hypertension: Secondary | ICD-10-CM | POA: Diagnosis not present

## 2022-12-07 DIAGNOSIS — Z85828 Personal history of other malignant neoplasm of skin: Secondary | ICD-10-CM | POA: Diagnosis not present

## 2022-12-07 DIAGNOSIS — D631 Anemia in chronic kidney disease: Secondary | ICD-10-CM | POA: Diagnosis present

## 2022-12-07 DIAGNOSIS — Z881 Allergy status to other antibiotic agents status: Secondary | ICD-10-CM | POA: Diagnosis not present

## 2022-12-07 DIAGNOSIS — M5416 Radiculopathy, lumbar region: Secondary | ICD-10-CM | POA: Diagnosis not present

## 2022-12-07 DIAGNOSIS — D509 Iron deficiency anemia, unspecified: Secondary | ICD-10-CM | POA: Diagnosis not present

## 2022-12-07 DIAGNOSIS — Z23 Encounter for immunization: Secondary | ICD-10-CM | POA: Diagnosis present

## 2022-12-07 DIAGNOSIS — Z66 Do not resuscitate: Secondary | ICD-10-CM | POA: Diagnosis present

## 2022-12-07 DIAGNOSIS — Z880 Allergy status to penicillin: Secondary | ICD-10-CM | POA: Diagnosis not present

## 2022-12-07 DIAGNOSIS — T84021A Dislocation of internal left hip prosthesis, initial encounter: Secondary | ICD-10-CM | POA: Diagnosis present

## 2022-12-07 DIAGNOSIS — Z7401 Bed confinement status: Secondary | ICD-10-CM | POA: Diagnosis not present

## 2022-12-07 DIAGNOSIS — T50995A Adverse effect of other drugs, medicaments and biological substances, initial encounter: Secondary | ICD-10-CM | POA: Diagnosis present

## 2022-12-07 DIAGNOSIS — Z87891 Personal history of nicotine dependence: Secondary | ICD-10-CM | POA: Diagnosis not present

## 2022-12-07 DIAGNOSIS — E559 Vitamin D deficiency, unspecified: Secondary | ICD-10-CM | POA: Diagnosis not present

## 2022-12-07 LAB — VITAMIN B12: Vitamin B-12: 1081 pg/mL — ABNORMAL HIGH (ref 180–914)

## 2022-12-07 LAB — BLOOD GAS, VENOUS
Acid-Base Excess: 0.1 mmol/L (ref 0.0–2.0)
Bicarbonate: 23.6 mmol/L (ref 20.0–28.0)
O2 Saturation: 99.1 %
Patient temperature: 36.6
pCO2, Ven: 33 mmHg — ABNORMAL LOW (ref 44–60)
pH, Ven: 7.46 — ABNORMAL HIGH (ref 7.25–7.43)
pO2, Ven: 69 mmHg — ABNORMAL HIGH (ref 32–45)

## 2022-12-07 LAB — COMPREHENSIVE METABOLIC PANEL
ALT: 19 U/L (ref 0–44)
AST: 30 U/L (ref 15–41)
Albumin: 2.6 g/dL — ABNORMAL LOW (ref 3.5–5.0)
Alkaline Phosphatase: 59 U/L (ref 38–126)
Anion gap: 10 (ref 5–15)
BUN: 37 mg/dL — ABNORMAL HIGH (ref 8–23)
CO2: 20 mmol/L — ABNORMAL LOW (ref 22–32)
Calcium: 8.4 mg/dL — ABNORMAL LOW (ref 8.9–10.3)
Chloride: 103 mmol/L (ref 98–111)
Creatinine, Ser: 1.13 mg/dL — ABNORMAL HIGH (ref 0.44–1.00)
GFR, Estimated: 46 mL/min — ABNORMAL LOW (ref >60)
Glucose, Bld: 91 mg/dL (ref 70–99)
Potassium: 4 mmol/L (ref 3.5–5.1)
Sodium: 133 mmol/L — ABNORMAL LOW (ref 135–145)
Total Bilirubin: 0.5 mg/dL (ref 0.3–1.2)
Total Protein: 6 g/dL — ABNORMAL LOW (ref 6.5–8.1)

## 2022-12-07 LAB — CBC WITH DIFFERENTIAL/PLATELET
Abs Immature Granulocytes: 0.04 10*3/uL (ref 0.00–0.07)
Basophils Absolute: 0.1 10*3/uL (ref 0.0–0.1)
Basophils Relative: 1 %
Eosinophils Absolute: 0.6 10*3/uL — ABNORMAL HIGH (ref 0.0–0.5)
Eosinophils Relative: 6 %
HCT: 24.3 % — ABNORMAL LOW (ref 36.0–46.0)
Hemoglobin: 7.6 g/dL — ABNORMAL LOW (ref 12.0–15.0)
Immature Granulocytes: 0 %
Lymphocytes Relative: 16 %
Lymphs Abs: 1.6 10*3/uL (ref 0.7–4.0)
MCH: 26.8 pg (ref 26.0–34.0)
MCHC: 31.3 g/dL (ref 30.0–36.0)
MCV: 85.6 fL (ref 80.0–100.0)
Monocytes Absolute: 1.5 10*3/uL — ABNORMAL HIGH (ref 0.1–1.0)
Monocytes Relative: 15 %
Neutro Abs: 6.3 10*3/uL (ref 1.7–7.7)
Neutrophils Relative %: 62 %
Platelets: 277 10*3/uL (ref 150–400)
RBC: 2.84 MIL/uL — ABNORMAL LOW (ref 3.87–5.11)
RDW: 15.4 % (ref 11.5–15.5)
WBC: 10.1 10*3/uL (ref 4.0–10.5)
nRBC: 0 % (ref 0.0–0.2)

## 2022-12-07 LAB — PROTIME-INR
INR: 1.2 (ref 0.8–1.2)
Prothrombin Time: 15.8 seconds — ABNORMAL HIGH (ref 11.4–15.2)

## 2022-12-07 LAB — CK: Total CK: 327 U/L — ABNORMAL HIGH (ref 38–234)

## 2022-12-07 LAB — MAGNESIUM: Magnesium: 2.2 mg/dL (ref 1.7–2.4)

## 2022-12-07 LAB — TSH: TSH: 1.768 u[IU]/mL (ref 0.350–4.500)

## 2022-12-07 LAB — AMMONIA: Ammonia: 12 umol/L (ref 9–35)

## 2022-12-07 MED ORDER — HYDROCERIN EX CREA
TOPICAL_CREAM | Freq: Two times a day (BID) | CUTANEOUS | Status: DC
  Administered 2022-12-08 – 2022-12-09 (×2): 1 via TOPICAL
  Filled 2022-12-07: qty 113

## 2022-12-07 MED ORDER — ACETAMINOPHEN 325 MG PO TABS
650.0000 mg | ORAL_TABLET | Freq: Four times a day (QID) | ORAL | Status: DC | PRN
  Administered 2022-12-08 (×2): 650 mg via ORAL
  Filled 2022-12-07 (×2): qty 2

## 2022-12-07 MED ORDER — LACTATED RINGERS IV SOLN: INTRAVENOUS | Status: AC

## 2022-12-07 MED ORDER — ACETAMINOPHEN 650 MG RE SUPP: 650.0000 mg | Freq: Four times a day (QID) | RECTAL | Status: DC | PRN

## 2022-12-07 MED ORDER — DILTIAZEM HCL ER COATED BEADS 240 MG PO CP24
240.0000 mg | ORAL_CAPSULE | Freq: Every day | ORAL | Status: DC
  Administered 2022-12-07 – 2022-12-10 (×4): 240 mg via ORAL
  Filled 2022-12-07 (×4): qty 1

## 2022-12-07 MED ORDER — CARVEDILOL 12.5 MG PO TABS
12.5000 mg | ORAL_TABLET | Freq: Two times a day (BID) | ORAL | Status: DC
  Administered 2022-12-07 – 2022-12-10 (×6): 12.5 mg via ORAL
  Filled 2022-12-07 (×6): qty 1

## 2022-12-07 MED ORDER — INFLUENZA VAC A&B SURF ANT ADJ 0.5 ML IM SUSY
0.5000 mL | PREFILLED_SYRINGE | INTRAMUSCULAR | Status: AC
  Administered 2022-12-09: 0.5 mL via INTRAMUSCULAR
  Filled 2022-12-07: qty 0.5

## 2022-12-07 MED ORDER — SODIUM CHLORIDE 0.9 % IV SOLN: INTRAVENOUS | Status: DC

## 2022-12-07 MED ORDER — ONDANSETRON HCL 4 MG/2ML IJ SOLN: 4.0000 mg | Freq: Four times a day (QID) | INTRAMUSCULAR | Status: DC | PRN

## 2022-12-07 MED ORDER — MELATONIN 3 MG PO TABS: 3.0000 mg | ORAL_TABLET | Freq: Every evening | ORAL | Status: DC | PRN

## 2022-12-07 MED ORDER — FERROUS SULFATE 325 (65 FE) MG PO TABS
650.0000 mg | ORAL_TABLET | Freq: Every day | ORAL | Status: DC
  Administered 2022-12-07 – 2022-12-10 (×4): 650 mg via ORAL
  Filled 2022-12-07 (×4): qty 2

## 2022-12-07 NOTE — ED Notes (Signed)
ED TO INPATIENT HANDOFF REPORT  ED Nurse Name and Phone #: Linus Orn Name/Age/Gender [name] 89 y.o. female Room/Bed: WA01/WA01  Code Status   Code Status: Limited: Do not attempt resuscitation (DNR) -DNR-LIMITED -Do Not Intubate/DNI   Home/SNF/Other Home Patient oriented to: self, place, and time Is this baseline? Yes   Triage Complete: Triage complete  Chief Complaint Acute encephalopathy [G93.40]  Triage Note Patient present from [facility] due to bilateral leg weeping and skin irritation. She was discharged from the hospital yesterday. She recently had a left knee replacement.      Allergies Allergies  Allergen Reactions   Cephalexin Other (See Comments)    Pt states this caused eye problems and STROKE-LIKE SYMPTOMS!!   Shellfish-Derived Products Hives and Rash   Clindamycin/Lincomycin Rash   Lincomycin Rash   Clindamycin Hcl Other (See Comments)    "Per pharmacist"   Fosfomycin Other (See Comments)    Disorientation   Levofloxacin Other (See Comments)    Reaction not recalled by the patient   Penicillamine Other (See Comments)    Reaction not recalled by the patient   Sugar-Protein-Starch Other (See Comments)    Sugary foods cause legs to cramp   Tape Other (See Comments)    Please use paper tape or Coban Wrap; patient's skin tears VERY easily!!   Ciprofloxacin Rash   Sulfa Antibiotics Hives and Rash   Sulfites Hives, Swelling and Rash    Level of Care/Admitting Diagnosis ED Disposition     ED Disposition  Admit   Condition  --   Comment  Hospital Area: [facility] [[id number]]  Level of Care: Telemetry [5]  Admit to tele based on following criteria: Monitor for Ischemic changes  May admit patient to [facility] or [facility] if equivalent level of care is available:: No  Covid Evaluation: Asymptomatic - no recent exposure (last 10 days) testing not required  Diagnosis: Acute encephalopathy [784696]  Admitting  Physician: Angie Fava [[id number]]  Attending Physician: Angie Fava [[id number]]  Certification:: I certify this patient will need inpatient services for at least 2 midnights  Expected Medical Readiness: 12/09/2022          B Medical/Surgery History Past Medical History:  Diagnosis Date   Anemia    CHRONIC   Aortic insufficiency    a. 2D echo 08/29/15: EF 55-60%, mild LVH, diastolic dysfunction, elevated LV filling pressure, mild AI, severe LAE, mild RAE, mild TR, dilated descending thoracic aorta just distal to the takeoff of the left subclavian, measuring 4.1 cm, no pericardial effusion.   Arthritis    OA-SOME BACK AND NECK PAIN,  GOES TO CHIROPRACTOR TWICE A MONTH;  HX OF JOINT REPLACEMENTS    Cancer (HCC)    SKIN CANCERS REMOVED FROM LEGS   CKD (chronic kidney disease), stage II    Depression    Essential hypertension    Gait abnormality 09/08/2017   GERD (gastroesophageal reflux disease)    Low sodium levels    Myocarditis (HCC)    PSVT (paroxysmal supraventricular tachycardia)    Recurrent Pericarditis    RECURRENT    Thoracic aneurysm    a. Followed by [facility] -last seen in 12/2013 w/ plan for f/u MRA and thoracic surgery f/u in 2 yrs, but patient does not wish to follow any longer.   Past Surgical History:  Procedure Laterality Date   APPENDECTOMY     BREAST EXCISIONAL BIOPSY Left 1992   benign   EXCISION/RELEASE BURSA  HIP Left 11/24/2012   Procedure: LEFT HIP BURSECTOMY AND TENDON REPAIR ;  Surgeon: Loanne Drilling, MD;  Location: [facility];  Service: Orthopedics;  Laterality: Left;   HIP CLOSED REDUCTION Left 12/19/2012   Procedure: CLOSED MANIPULATION HIP;  Surgeon: Shelda Pal, MD;  Location: [facility];  Service: Orthopedics;  Laterality: Left;   HIP CLOSED REDUCTION Left 12/05/2013   Procedure: CLOSED MANIPULATION HIP;  Surgeon: Verlee Rossetti, MD;  Location: [facility];  Service: Orthopedics;  Laterality: Left;   HIP CLOSED REDUCTION Left 09/22/2015    Procedure: CLOSED REDUCTION HIP;  Surgeon: Venita Lick, MD;  Location: [facility];  Service: Orthopedics;  Laterality: Left;   HIP CLOSED REDUCTION Left 12/05/2022   Procedure: CLOSED MANIPULATION HIP;  Surgeon: Ollen Gross, MD;  Location: [facility];  Service: Orthopedics;  Laterality: Left;   JOINT REPLACEMENT     LEFT TOTAL HIP REPLACEMENT AND REVISIONS X 2   OOPHORECTOMY     has a partial of one ovary remaingin   PELVIC LAPAROSCOPY     ovarian cyst removal,    REVISION TOTAL HIP ARTHROPLASTY     left   right Achilles Tendon repair  5/13   TOTAL KNEE ARTHROPLASTY     right   TOTAL KNEE ARTHROPLASTY     left   VAGINAL HYSTERECTOMY     with ovarian cyst removal     A IV Location/Drains/Wounds Patient Lines/Drains/Airways Status     Active Line/Drains/Airways     None            Intake/Output Last 24 hours No intake or output data in the 24 hours ending 12/07/22 0247  Labs/Imaging Results for orders placed or performed during the hospital encounter of 12/06/22 (from the past 48 hour(s))  CBC with Differential     Status: Abnormal   Collection Time: 12/06/22  9:27 PM  Result Value Ref Range   WBC 11.3 (H) 4.0 - 10.5 K/uL   RBC 2.86 (L) 3.87 - 5.11 MIL/uL   Hemoglobin 7.8 (L) 12.0 - 15.0 g/dL   HCT 57.8 (L) 46.9 - 62.9 %   MCV 86.7 80.0 - 100.0 fL   MCH 27.3 26.0 - 34.0 pg   MCHC 31.5 30.0 - 36.0 g/dL   RDW 52.8 (H) 41.3 - 24.4 %   Platelets 281 150 - 400 K/uL   nRBC 0.0 0.0 - 0.2 %   Neutrophils Relative % 68 %   Neutro Abs 7.7 1.7 - 7.7 K/uL   Lymphocytes Relative 15 %   Lymphs Abs 1.6 0.7 - 4.0 K/uL   Monocytes Relative 13 %   Monocytes Absolute 1.5 (H) 0.1 - 1.0 K/uL   Eosinophils Relative 2 %   Eosinophils Absolute 0.2 0.0 - 0.5 K/uL   Basophils Relative 1 %   Basophils Absolute 0.1 0.0 - 0.1 K/uL   Immature Granulocytes 1 %   Abs Immature Granulocytes 0.08 (H) 0.00 - 0.07 K/uL    Comment: Performed at [facility], [address], [place], [place] [address]  Basic metabolic panel     Status: Abnormal   Collection Time: 12/06/22  9:27 PM  Result Value Ref Range   Sodium 133 (L) 135 - 145 mmol/L   Potassium 4.0 3.5 - 5.1 mmol/L   Chloride 102 98 - 111 mmol/L   CO2 22 22 - 32 mmol/L   Glucose, Bld 106 (H) 70 - 99 mg/dL    Comment: Glucose reference range applies only to  samples taken after fasting for at least 8 hours.   BUN 35 (H) 8 - 23 mg/dL   Creatinine, Ser 1.61 (H) 0.44 - 1.00 mg/dL   Calcium 8.4 (L) 8.9 - 10.3 mg/dL   GFR, Estimated 43 (L) >60 mL/min    Comment: (NOTE) Calculated using the CKD-EPI Creatinine Equation (2021)    Anion gap 9 5 - 15    Comment: Performed at [facility], [address], [place], [place] [address]  Urinalysis, Routine w reflex microscopic -Urine, Clean Catch     Status: Abnormal   Collection Time: 12/06/22 10:32 PM  Result Value Ref Range   Color, Urine YELLOW YELLOW   APPearance HAZY (A) CLEAR   Specific Gravity, Urine 1.019 1.005 - 1.030   pH 5.0 5.0 - 8.0   Glucose, UA NEGATIVE NEGATIVE mg/dL   Hgb urine dipstick NEGATIVE NEGATIVE   Bilirubin Urine NEGATIVE NEGATIVE   Ketones, ur NEGATIVE NEGATIVE mg/dL   Protein, ur 30 (A) NEGATIVE mg/dL   Nitrite NEGATIVE NEGATIVE   Leukocytes,Ua SMALL (A) NEGATIVE   RBC / HPF 0-5 0 - 5 RBC/hpf   WBC, UA 6-10 0 - 5 WBC/hpf   Bacteria, UA NONE SEEN NONE SEEN   Squamous Epithelial / HPF 0-5 0 - 5 /HPF   Mucus PRESENT     Comment: Performed at [facility], [address] [place], [place] [address]   CT Head Wo Contrast  Result Date: 12/07/2022 CLINICAL DATA:  Mental status change EXAM: CT HEAD WITHOUT CONTRAST TECHNIQUE: Contiguous axial images were obtained from the base of the skull through the vertex without intravenous contrast. RADIATION DOSE REDUCTION: This exam was performed according to the departmental dose-optimization program which includes automated exposure control, adjustment of  the mA and/or kV according to patient size and/or use of iterative reconstruction technique. COMPARISON:  CT brain 04/07/2022, MRI 03/06/2022 FINDINGS: Brain: No acute territorial infarction, hemorrhage or intracranial mass. Chronic small vessel ischemic changes of the white matter and atrophy. Similar ventricular enlargement somewhat out of proportion to atrophy. Vascular: No hyperdense vessels.  Carotid vascular calcification Skull: Normal. Negative for fracture or focal lesion. Sinuses/Orbits: No acute finding. Other: None IMPRESSION: 1. No CT evidence for acute intracranial abnormality. 2. Chronic small vessel ischemic changes of the white matter and atrophy. 3. Similar ventricular enlargement somewhat out of proportion to atrophy. Findings could be due to normal pressure hydrocephalus superimposed on atrophy and chronic microvascular disease, this finding is unchanged Electronically Signed   By: Jasmine Pang M.D.   On: 12/07/2022 00:20   DG CHEST PORT 1 VIEW  Result Date: 12/07/2022 CLINICAL DATA:  Altered mental status EXAM: PORTABLE CHEST 1 VIEW COMPARISON:  08/23/2022, CT 06/13/2022 FINDINGS: Underlying chronic interstitial lung disease with fibrosis and ground-glass density. No acute superimposed airspace disease. Stable cardiomediastinal silhouette. No pneumothorax IMPRESSION: Chronic interstitial lung disease without definitive acute superimposed airspace disease. Electronically Signed   By: Jasmine Pang M.D.   On: 12/07/2022 00:03   DG C-Arm 1-60 Min-No Report  Result Date: 12/05/2022 Fluoroscopy was utilized by the requesting physician.  No radiographic interpretation.   DG Femur Min 2 Views Left  Result Date: 12/05/2022 CLINICAL DATA:  89 year old female with history of dislocation of the left prosthetic fifth status post reduction. EXAM: LEFT FEMUR 2 VIEWS COMPARISON:  Left hip radiograph 12/05/2022. FINDINGS: Postoperative changes of left hip total arthroplasty and left knee total  arthroplasty are noted. No periprosthetic fractures are identified. Femoral shaft appears  intact. Femoral head is now dislocated inferior, medial and anteriorly relative to the prosthetic acetabular cup. IMPRESSION: 1. Persistent dislocation of the left prosthetic femoral head relative to the prosthetic acetabular cup, as above. 2. No other acute abnormality of the left femur. Electronically Signed   By: Trudie Reed M.D.   On: 12/05/2022 06:01   DG Pelvis 1-2 Views  Result Date: 12/05/2022 CLINICAL DATA:  89 year old female with history of left hip dislocation status post closed reduction. EXAM: PELVIS - 1-2 VIEW COMPARISON:  12/05/2022 at 3:39 a.m. FINDINGS: Prosthetic femoral head remains dislocated, now inferomedially on this single view examination. IMPRESSION: 1. Persistent now inferomedial dislocation of the prosthetic left femoral head relative to the prosthetic acetabulum. Electronically Signed   By: Trudie Reed M.D.   On: 12/05/2022 05:59   DG Hip Unilat W or Wo Pelvis 2-3 Views Left  Result Date: 12/05/2022 CLINICAL DATA:  89 year old female with history of left hip dislocation after sitting in a chair. EXAM: DG HIP (WITH OR WITHOUT PELVIS) 2-3V LEFT COMPARISON:  09/10/2022. FINDINGS: Patient is status post left hip arthroplasty. No periprosthetic fracture identified. The prosthetic femoral head is dislocated superiorly and laterally relative to the acetabulum, with proximal migration of the femur. Bony pelvic ring appears intact, as do the visualized portions of the right proximal femur. IMPRESSION: 1. Status post left hip arthroplasty with dislocation of the prosthetic femoral head relative to the prosthetic acetabular cup, as above. Electronically Signed   By: Trudie Reed M.D.   On: 12/05/2022 05:05    Pending Labs Unresulted Labs (From admission, onward)     Start     Ordered   12/07/22 0500  CBC with Differential/Platelet  Tomorrow morning,   R        12/07/22 0047    12/07/22 0500  Comprehensive metabolic panel  Tomorrow morning,   R        12/07/22 0047   12/07/22 0500  Magnesium  Tomorrow morning,   R        12/07/22 0047   12/07/22 0048  Magnesium  Add-on,   AD        12/07/22 0047            Vitals/Pain Today's Vitals   12/06/22 2145 12/06/22 2221 12/06/22 2230 12/07/22 0100  BP: (!) 162/83  139/72   Pulse: (!) 106 (!) 107    Temp:    97.8 F (36.6 C)  TempSrc:    Oral  SpO2: 98% 98%    PainSc:        Isolation Precautions No active isolations  Medications Medications  acetaminophen (TYLENOL) tablet 650 mg (has no administration in time range)    Or  acetaminophen (TYLENOL) suppository 650 mg (has no administration in time range)  melatonin tablet 3 mg (has no administration in time range)  ondansetron (ZOFRAN) injection 4 mg (has no administration in time range)    Mobility walks with device     Focused Assessments Wound/skin assessment   R Recommendations: See Admitting Provider Note  Report given to:   Additional Notes: aaox3 alert to self, place and time some confusion with situation, walks with device at facility.

## 2022-12-07 NOTE — [place] FL2 (Signed)
[place] MEDICAID FL2 LEVEL OF CARE FORM     IDENTIFICATION  Patient Name: [name] Birthdate: 11-21-1929 Sex: female Admission Date (Current Location): 12/06/2022  [facility] and [place] Number:  Producer, television/film/video and Address:  [facility],  [address] [place], [place] [address]      Provider Number: [id number]  Attending Physician Name and Address:  Lorin Glass, MD  Relative Name and Phone Number:  Hiram Comber [phone]  [phone]  WOLBERT(Daughter in law),GWYN Other [phone]    [facility] Daughter [phone]    Current Level of Care: Hospital Recommended Level of Care: Skilled Nursing Facility Prior Approval Number:    Date Approved/Denied:   PASRR Number: [id number]  Discharge Plan: SNF    Current Diagnoses: Patient Active Problem List   Diagnosis Date Noted   Acute encephalopathy 12/07/2022   Dehydration 12/07/2022   Acute prerenal azotemia 12/07/2022   Leukocytosis 12/07/2022   Chronic hyponatremia 12/07/2022   ILD (interstitial lung disease) (HCC) 10/20/2022   CAP (community acquired pneumonia) 06/12/2022   Chronic heart failure with preserved ejection fraction (HFpEF) (HCC) 04/07/2022   Advanced care planning/counseling discussion 04/07/2022   Chronic venous stasis dermatitis of both lower extremities 04/07/2022   Malaise and fatigue 07/15/2021   Primary osteoarthritis 04/20/2021   Inflammatory polyarthropathy (HCC) 04/20/2021   Hyperuricemia 04/20/2021   Generalized weakness 04/20/2021   Chronic kidney disease, stage 3 unspecified (HCC) 04/20/2021   Adjustment disorder with mixed disturbance of emotions and conduct 01/20/2021   Severe recurrent major depression without psychotic features (HCC) 01/19/2021   Pincer nail deformity 12/01/2020   Iron deficiency anemia 03/24/2020   Clavi 02/19/2020   Lumbar radiculopathy 06/17/2019   Pain due to onychomycosis of toenails of both feet 05/14/2019   Mixed incontinence  03/10/2019   Cervical spondylosis 06/07/2018   DDD (degenerative disc disease), cervical 05/20/2018   History of total knee replacement, left 04/13/2018   History of total knee replacement, right 04/13/2018   History of revision of total replacement of left hip joint 04/13/2018   Gait abnormality 09/08/2017   Chronic cystitis 07/05/2017   Dysuria 07/05/2017   Anemia of chronic disease 09/22/2015   h/o pericarditis    Essential hypertension    Aortic insufficiency    Posterior vitreous detachment, bilateral 06/05/2015   Left wrist pain 05/16/2015   Triquetral chip fracture, left, closed, initial encounter 05/16/2015   Primary osteoarthritis of first carpometacarpal joint of right hand 02/11/2015   Primary osteoarthritis of right wrist 02/11/2015   Arthropathy 11/25/2014   Multiple skin tears 11/25/2014   Hip dislocation, left (HCC) 12/05/2013   Trochanteric bursitis of left hip 11/24/2012   Aneurysm of thoracic aorta (HCC)    Nonexudative age-related macular degeneration 04/22/2011   Posterior capsular opacification, right 04/22/2011   Status post intraocular lens implant 04/22/2011    Orientation RESPIRATION BLADDER Height & Weight     Self, Time, Situation, Place  Normal Continent Weight: 134 lb 7.7 oz (61 kg) Height:     BEHAVIORAL SYMPTOMS/MOOD NEUROLOGICAL BOWEL NUTRITION STATUS      Continent Diet  AMBULATORY STATUS COMMUNICATION OF NEEDS Skin   Limited Assist Verbally Surgical wounds                       Personal Care Assistance Level of Assistance  Bathing, Feeding, Dressing Bathing Assistance: Limited assistance Feeding assistance: Independent Dressing Assistance: Limited assistance     Functional Limitations Info  Sight, Hearing, Speech Sight Info: Adequate  Hearing Info: Adequate Speech Info: Adequate    SPECIAL CARE FACTORS FREQUENCY  PT (By licensed PT), OT (By licensed OT)     PT Frequency: Minimum 5x a week OT Frequency: Minimum 5x a week             Contractures Contractures Info: Not present    Additional Factors Info  Code Status, Allergies Code Status Info: DNR Allergies Info: Cephalexin  Shellfish-derived Products  Clindamycin/lincomycin  Lincomycin  Clindamycin Hcl  Fosfomycin  Levofloxacin  Penicillamine  Sugar-protein-starch  Tape  Ciprofloxacin  Sulfa Antibiotics  Sulfites           Current Medications (12/07/2022):  This is the current hospital active medication list Current Facility-Administered Medications  Medication Dose Route Frequency Provider Last Rate Last Admin   0.9 %  sodium chloride infusion   Intravenous Continuous Dahal, Binaya, MD 75 mL/hr at 12/07/22 1536 New Bag at 12/07/22 1536   acetaminophen (TYLENOL) tablet 650 mg  650 mg Oral Q6H PRN Howerter, Justin B, DO       Or   acetaminophen (TYLENOL) suppository 650 mg  650 mg Rectal Q6H PRN Howerter, Justin B, DO       carvedilol (COREG) tablet 12.5 mg  12.5 mg Oral BID WC Dahal, Binaya, MD       diltiazem (CARDIZEM CD) 24 hr capsule 240 mg  240 mg Oral Daily Howerter, Justin B, DO   240 mg at 12/07/22 0947   ferrous sulfate tablet 650 mg  650 mg Oral Q breakfast Howerter, Justin B, DO   650 mg at 12/07/22 0730   hydrocerin (EUCERIN) cream   Topical BID Dahal, Melina Schools, MD   Given at 12/07/22 1546   melatonin tablet 3 mg  3 mg Oral QHS PRN Howerter, Justin B, DO       ondansetron (ZOFRAN) injection 4 mg  4 mg Intravenous Q6H PRN Howerter, Justin B, DO         Discharge Medications: Please see discharge summary for a list of discharge medications.  Relevant Imaging Results:  Relevant Lab Results:   Additional Information SSN [id number]  Darleene Cleaver, LCSW

## 2022-12-07 NOTE — Progress Notes (Signed)
PROGRESS NOTE  [name]  DOB: 1929/08/27  PCP: Emilio Aspen, MD JYN:[id number]  DOA: 12/06/2022  LOS: 0 days  Hospital Day: 2  Brief narrative: [name] is a 89 y.o. female with PMH significant for HTN, PSVT, CKD, chronic anemia, chronic hyponatremia, aortic insufficiency, thoracic aneurysm, GERD, arthritis, depression, h/o multiple hip surgeries and dislocations in the past.  He lives at [facility] assisted living facility. 9/22, patient was brought to the ED for left hip dislocation that he suffered while bending over. Reduction was attempted but unsuccessful in the ED.  She was taken to the OR and underwent closed reduction by Dr. Lequita Halt.  She was subsequent discharged back to ALF the same day. In the subsequent 24 hours, family noted patient was more confused, weak there is also noted multiple skin tears in her lower extremities suspected to have sustained during the reduction attempts 9/23, patient was brought to the ED again.  In the ED, patient was afebrile, blood pressure in 150s Labs showed WBC count of 11.3, hemoglobin 7.8, BN/creatinine 35/1.1, sodium 133  Urinalysis showed hazy yellow urine with small leukocytes CT head did not show any acute intracranial normality, showed chronic small vessel ischemic changes Admitted to [facility]  Subjective: Patient was seen and examined this afternoon. Sitting up in recliner.  Not in distress. Son at bedside. Awake, alert.  Personal, her mental status gradually better. Chart reviewed Repeat labs this morning showed WBC count 10, hemoglobin 7.6, CK elevated to 327, BUN/creatinine 37/1.13, sodium 133 Seen by PT.  SNF recommended  Assessment and plan: Acute metabolic encephalopathy Brought into ED for confusion, disorientation In the setting of dehydration, use of pain meds and sedatives for dislocated hip reduction No evidence of infection Mental status is gradually improving.  Bilateral lower extremity  wound/skin tears Seems to have sustained during hip reduction attempts  No evidence of infection. Continue local wound care Recent Labs  Lab 12/05/22 0300 12/06/22 2127 12/07/22 0522  WBC 10.4 11.3* 10.1   Dehydration Rhabdomyolysis Creatinine and CPK mildly elevated Continue IV hydration.  Continue monitor labs Recent Labs  Lab 12/07/22 0522  CKTOTAL 327*    Recent Labs    04/10/22 0736 04/11/22 0637 04/12/22 0533 06/12/22 1556 08/02/22 1321 10/20/22 1125 10/25/22 1421 12/05/22 0300 12/06/22 2127 12/07/22 0522  BUN 30* 33* 36* 36* 44* 29 50* 47* 35* 37*  CREATININE 1.01* 0.97 1.08* 1.19* 1.29* 1.07* 1.29* 0.97 1.21* 1.13*   Generalized weakness Recent left hip dislocation S/p closed reduction in OR PT eval obtained.  SNF recommended.  Essential hypertension PTA meds- Coreg, diltiazem, losartan and Lasix every other day Currently on Cardizem and carvedilol  Keep Lasix and losartan on hold.  Acute on chronic anemia  Hemoglobin trend as below.  Baseline hemoglobin close to 9.   Hemoglobin at 7.6 this morning.  No evidence of bleeding.  Likely dilutional.  Continue to monitor  Recent Labs    02/01/22 1220 03/05/22 1304 04/08/22 1720 04/10/22 0736 08/02/22 1321 10/25/22 1421 12/05/22 0300 12/06/22 2127 12/07/22 0522  HGB 9.4*   < >  --    < > 9.3* 8.6* 8.9* 7.8* 7.6*  MCV 87.9   < >  --    < > 86.2 85.6 86.3 86.7 85.6  VITAMINB12 668  --   --   --  667 560  --   --  1,081*  FERRITIN 212  --   --   --  217 117  --   --   --  TIBC 293  --   --   --  305 301  --   --   --   IRON 54  --  17*  --  40 36  --   --   --    < > = values in this interval not displayed.   Chronic hyponatremia Sodium level chronically between 130 and 135 Recent Labs  Lab 12/05/22 0300 12/06/22 2127 12/07/22 0522  NA 134* 133* 133*    Goals of care   Code Status: Limited: Do not attempt resuscitation (DNR) -DNR-LIMITED -Do Not Intubate/DNI      DVT prophylaxis:  SCDs  Start: 12/07/22 0047   Antimicrobials: None Fluid: Continue NS at 75 mL/h Consultants: None Family Communication: Son Mr. [name] at bedside  Status: Inpatient Level of care:  Telemetry   Patient is from: Assisted living facility Needs to continue in-hospital care: Monitor renal function, CK level, hemoglobin Anticipated d/c to: SNF      Diet:  Diet Order             Diet regular Room service appropriate? Yes; Fluid consistency: Thin  Diet effective now                   Scheduled Meds:  carvedilol  12.5 mg Oral BID WC   diltiazem  240 mg Oral Daily   ferrous sulfate  650 mg Oral Q breakfast   hydrocerin   Topical BID    PRN meds: acetaminophen **OR** acetaminophen, melatonin, ondansetron (ZOFRAN) IV   Infusions:   sodium chloride       Antimicrobials: Anti-infectives (From admission, onward)    None       Objective: Vitals:   12/07/22 0620 12/07/22 1146  BP: (!) 146/89 (!) 148/80  Pulse: (!) 107 85  Resp: 16 18  Temp: 98.1 F (36.7 C) 97.7 F (36.5 C)  SpO2: 98% 100%    Intake/Output Summary (Last 24 hours) at 12/07/2022 1432 Last data filed at 12/07/2022 0700 Gross per 24 hour  Intake 485.02 ml  Output 150 ml  Net 335.02 ml   Filed Weights   12/07/22 0500  Weight: 61 kg   Weight change:  Body mass index is 22.73 kg/m.   Physical Exam: General exam: Pleasant, Skin: No rashes, lesions or ulcers. HEENT: Atraumatic, normocephalic, no obvious bleeding Lungs: Clear to auscultate bilaterally CVS: Regular rate and rhythm, no murmur GI/Abd soft, nontender, nondistended, bowel sound present CNS: Alert, awake, slow to respond, oriented to place and person Psychiatry: Mood appropriate Extremities: Bilateral trace pedal edema with stasis changes, overall dry skin with healing ecchymosis.  Data Review: I have personally reviewed the laboratory data and studies available.  F/u labs ordered Unresulted Labs (From admission, onward)      Start     Ordered   12/08/22 0500  CBC with Differential/Platelet  Tomorrow morning,   R        12/07/22 1431   12/08/22 0500  Basic metabolic panel  Tomorrow morning,   R        12/07/22 1431   12/08/22 0500  CK  Tomorrow morning,   R        12/07/22 1431            Total time spent in review of labs and imaging, patient evaluation, formulation of plan, documentation and communication with family: 55 minutes  Signed, Lorin Glass, MD [facility] 12/07/2022

## 2022-12-07 NOTE — Evaluation (Signed)
Occupational Therapy Evaluation Patient Details Name: [name] MRN: [id number] DOB: 1929-10-24 Today's Date: 12/07/2022   History of Present Illness 89 yo female presents to therapy following hospital admission on 12/06/2022 due to new onset of confusion and generalized weakness. Pt recently hospitalized due to L hip dislocation secondary to trunk and hip flexion during ADLs unable to reduce in ED requiring closed reduction in OR and pt d/c to [facility] on 12/05/2022. Pt found to have acute encephalopathy, dehydration, mild leukocytosis and generalized weakness with B LE skin tears attributed to most recent L hip reduction. 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   Clinical Impression   Patient is currently requiring assistance with ADLs including up to Total assist with Lower body ADLs, up to minimal assist with Upper body ADLs,  as well as Minimal assist and significantly increased time and cues with functional transfers to [facility] with RW.   Current level of function is below patient's typical baseline.  During this evaluation, patient was limited by generalized weakness, impaired activity tolerance, memory and cognitive deficits with inability to recall more than 1/3 posterior hip precautions directly after educated on them and with handout available, and BLE pain, esp to reduced LT hip, all of which has the potential to impact patient's safety and independence during functional mobility, as well as performance for ADLs.   Patient lives at [facility] which she reports is the Independent wing. Has baseline assistance from staff with house cleaning, meals and transportation. Pt reports being Mod I with ADLs, however pt is a poor historian at this time and no family present to clarify.  Patient demonstrates fair rehab potential, but concerned re: memory  deficits with posterior hip precautions and h/o multiple hip dislocations.  Pt should benefit from continued skilled occupational therapy services while in acute care to maximize safety, independence and quality of life at home.  Continued occupational therapy services at a skilled nursing facility are recommended.  ?        If plan is discharge home, recommend the following: Supervision due to cognitive status;Direct supervision/assist for medications management;Direct supervision/assist for financial management;Assist for transportation;Assistance with cooking/housework;A little help with walking and/or transfers;A lot of help with bathing/dressing/bathroom    Functional Status Assessment  Patient has had a recent decline in their functional status and demonstrates the ability to make significant improvements in function in a reasonable and predictable amount of time.  Equipment Recommendations   (Unsure if pt could safely use a h ip kit due to memory impairments.)    Recommendations for Other Services       Precautions / Restrictions Precautions Precautions: Fall;Posterior Hip Precaution Booklet Issued: Yes (comment) Precaution Comments: Educated pt in depth on posterior hip precautions with handout provided.  After education, pt able to recall 1/3 after increased time. Restrictions Weight Bearing Restrictions: No      Mobility Bed Mobility               General bed mobility comments: Pt received in recliner.    Transfers                          Balance Overall balance assessment: Needs assistance Sitting-balance support: Feet supported, No upper extremity supported Sitting balance-Leahy Scale: Good Sitting balance - Comments: Sat edge of recliner forgrooming without LOB.   Standing  balance support: Bilateral upper extremity supported, During functional activity, Reliant on assistive device for balance Standing balance-Leahy Scale: Poor                              ADL either performed or assessed with clinical judgement   ADL Overall ADL's : Needs assistance/impaired Eating/Feeding: Set up;Cueing for sequencing;Sitting   Grooming: Sitting;Minimal assistance;Oral care;Cueing for sequencing Grooming Details (indicate cue type and reason): Pt dropping things. Rather scattered with requests for grooming items. Needs cues to focus on task at hand. Upper Body Bathing: Minimal assistance;Sitting   Lower Body Bathing: Maximal assistance;Bed level   Upper Body Dressing : Minimal assistance;Cueing for sequencing;Cueing for compensatory techniques;Sitting   Lower Body Dressing: Total assistance;Cueing for sequencing;Cueing for compensatory techniques Lower Body Dressing Details (indicate cue type and reason): Pt unable to retain posterior hip precautions.  Needs Total Assist for safety.                     Vision   Vision Assessment?: No apparent visual deficits     Perception         Praxis         Pertinent Vitals/Pain Pain Assessment Pain Assessment: Faces Faces Pain Scale: Hurts even more Pain Location: B LEs L > R due to wounds and recent open L hip reduction. LT LE when lowering recliner legs to floor. Pain Descriptors / Indicators: Aching, Constant, Discomfort Pain Intervention(s): Limited activity within patient's tolerance, Monitored during session, Repositioned (Supported LLE manually while lowering recliner support. Pt reported this helped.)     Extremity/Trunk Assessment Upper Extremity Assessment Upper Extremity Assessment:  (Bil scapular winging. RT>LT) RUE Deficits / Details: "Bad shoulder" with limited ROM.  Elbow to grip: WFL with hand deformities from arthritis. Elbow->Grip: 3+/5 RUE Sensation: WNL LUE Deficits / Details: Shoulder and Elbow to grip: WFL with hand deformities from arthritis. Elbow->Grip: 3+/5 LUE Sensation: WNL   Lower Extremity Assessment Lower Extremity Assessment: LLE  deficits/detail LLE Deficits / Details: LLE edema noted, MMT NT due to recent open L hip reduction, reports of pain and limited L hip  and knee flexion with mobility tasks LLE Sensation: WNL   Cervical / Trunk Assessment Cervical / Trunk Assessment: Kyphotic   Communication Communication Communication: No apparent difficulties Cueing Techniques: Verbal cues;Visual cues;Gestural cues   Cognition Arousal: Alert   Overall Cognitive Status: No family/caregiver present to determine baseline cognitive functioning Area of Impairment: Orientation, Memory, Attention, Safety/judgement, Awareness                 Orientation Level: Disoriented to, Situation Current Attention Level: Focused Memory: Decreased recall of precautions, Decreased short-term memory   Safety/Judgement: Decreased awareness of safety, Decreased awareness of deficits Awareness: Intellectual         General Comments  BLE wounds, redness/rash. Back red and rashy as well. Pt reports itching.    Exercises     Shoulder Instructions      Home Living Family/patient expects to be discharged to:: Private residence Living Arrangements: Alone Available Help at Discharge: Family Type of Home: Apartment Home Access: Level entry     Home Layout: One level     Bathroom Shower/Tub: Producer, television/film/video: Handicapped height Bathroom Accessibility: Yes   Home Equipment: Agricultural consultant (2 wheels);Rollator (4 wheels);Grab bars - toilet;Grab bars - tub/shower;Shower seat - built Retail banker  Prior Functioning/Environment Prior Level of Function : Needs assist       Physical Assist : ADLs (physical)   ADLs (physical): IADLs Mobility Comments: mod I with rollator for mobility in apartment and facility, mod I for ADLs, self care tasks ADLs Comments: [facility] provides meals, housekeeping, and transportation. Pt does own laundry.        OT Problem  List: Decreased cognition;Decreased strength;Decreased range of motion;Decreased safety awareness;Decreased knowledge of use of DME or AE;Decreased activity tolerance;Impaired UE functional use;Pain;Impaired balance (sitting and/or standing);Decreased knowledge of precautions      OT Treatment/Interventions:      OT Goals(Current goals can be found in the care plan section) Acute Rehab OT Goals Patient Stated Goal: "I couldn't say" OT Goal Formulation: Patient unable to participate in goal setting Time For Goal Achievement: 12/21/22 Potential to Achieve Goals: Fair ADL Goals Pt Will Perform Grooming: standing;with supervision Pt Will Perform Lower Body Bathing: with min assist;with adaptive equipment;sitting/lateral leans Pt Will Perform Lower Body Dressing: with adaptive equipment;with contact guard assist;sitting/lateral leans;sit to/from stand Pt Will Transfer to Toilet: ambulating;with supervision Pt Will Perform Toileting - Clothing Manipulation and hygiene: with adaptive equipment;sitting/lateral leans;sit to/from stand;with contact guard assist Pt/caregiver will Perform Home Exercise Program: Increased strength;Both right and left upper extremity;With Supervision  OT Frequency:      Co-evaluation              AM-PAC OT "6 Clicks" Daily Activity     Outcome Measure Help from another person eating meals?: A Little Help from another person taking care of personal grooming?: A Little Help from another person toileting, which includes using toliet, bedpan, or urinal?: A Lot Help from another person bathing (including washing, rinsing, drying)?: A Lot Help from another person to put on and taking off regular upper body clothing?: A Little Help from another person to put on and taking off regular lower body clothing?: Total 6 Click Score: 14   End of Session Equipment Utilized During Treatment: Gait belt;Rolling walker (2 wheels) Nurse Communication: Mobility status;Other  (comment) (Pt position post OT)  Activity Tolerance: Patient limited by pain Patient left: in chair;with call bell/phone within reach  OT Visit Diagnosis: Pain;Muscle weakness (generalized) (M62.81);Unsteadiness on feet (R26.81) (Wounds) Pain - Right/Left: Left Pain - part of body: Leg;Hip                Time: 1120-1220 OT Time Calculation (min): 60 min Charges:  OT General Charges $OT Visit: 1 Visit OT Evaluation $OT Eval Low Complexity: 1 Low OT Treatments $Self Care/Home Management : 23-37 mins $Therapeutic Activity: 8-22 mins  Victorino Dike, OT Acute Rehab Services Office: [phone] 12/07/2022  Theodoro Clock 12/07/2022, 12:33 PM

## 2022-12-07 NOTE — H&P (Signed)
History and Physical      [name] DGL:[id number] DOB: 28-Jun-1929 DOA: 12/06/2022; DOS: 12/07/2022  PCP: Emilio Aspen, MD  Patient coming from: Independent living facility  I have personally briefly reviewed patient's old medical records in [facility]  Chief Complaint: confusion  HPI: [name] is a 89 y.o. female with medical history significant for essential hypertension, anemia of chronic disease associated baseline hemoglobin 8-10, chronic hyponatremia with baseline serum sodium range 1 30-1 34, who is admitted to [facility] on 12/06/2022 with acute encephalopathy after presenting from independent living facility to [facility] ED for evaluation of confusion.   In the setting of the patient's altered mental status, the following history is provided by the patient's son in addition to my discussions with the EDP and via chart review.  2 days ago the patient had presented to [facility], ED with acute left hip dislocation.  Attempts to reduce left hip in the ED were unsuccessful, prompting orthopedic surgery to be consulted and the patient was taken to the operating room for reduction under anesthesia which was subsequently successful.  She managed with noted to have multiple skin tears on her bilateral lower extremities in the setting of the above attempts at left hip reduction.  This was managed with Tegaderm, and the patient was subsequently discharged back to her independent living facility on 12/05/2022.  Over the last day, the patient's son is noted the patient to be more confused relative to her baseline mental status.  Patient has also been exhibiting evidence of generalized weakness without overt evidence of acute focal weakness.  In the setting of these findings, the patient was brought to [facility] emergency department this evening for further evaluation management thereof.     ED Course:  Vital signs in the ED were notable for the following:  Afebrile; heart rates in the 80s to low 100s; systolic blood pressures in the 130s to 150s; respiratory rate 20-22, oxygen saturation 98 to 99% on room air.  Labs were notable for the following: BMP was notable for the following: Sodium 133 compared to 134 on 12/04/2022, BUN 35, creatinine 1.21 compared to 0.97 on 12/04/2022, BUN to creatinine ratio 28.9, glucose 106.  CBC notable for white blood cell count 11,300 relative to 10,400 on 12/04/2022, hemoglobin 7.8 associated normocytic/normochromic properties and relative demonstration prior hemoglobin value of 8.9 on 12/04/2022, platelet count 281.  Urinalysis notable for 6-10 white blood cells and nitrite negative finding.  Per my interpretation, EKG in ED demonstrated the following: No EKG performed today.  Imaging in the ED, per corresponding formal radiology read, was notable for the following: 1 view chest x-ray showed no evidence of acute cardiopulmonary process, including no evidence of infiltrate, edema, effusion, or pneumothorax.  Noncontrast CT head showed no evidence of acute intracranial process, Cleen evidence intracranial urgent evidence of acute infarct.  While in the ED, the following were administered: None  Subsequently, the patient was admitted for further evaluation management of presenting acute encephalopathy, with presentation also notable for clinical evidence of dehydration, generalized weakness, as well as the presence of skin tears on the bilateral lower extremities and labs notable for mild leukocytosis.    Review of Systems: As per HPI otherwise 10 point review of systems negative.   Past Medical History:  Diagnosis Date   Anemia    CHRONIC   Aortic insufficiency    a. 2D echo 08/29/15: EF 55-60%, mild LVH, diastolic dysfunction, elevated LV filling pressure,  mild AI, severe LAE, mild RAE, mild TR, dilated descending thoracic aorta just distal to the takeoff of the left subclavian, measuring 4.1 cm, no pericardial  effusion.   Arthritis    OA-SOME BACK AND NECK PAIN,  GOES TO CHIROPRACTOR TWICE A MONTH;  HX OF JOINT REPLACEMENTS    Cancer (HCC)    SKIN CANCERS REMOVED FROM LEGS   CKD (chronic kidney disease), stage II    Depression    Essential hypertension    Gait abnormality 09/08/2017   GERD (gastroesophageal reflux disease)    Low sodium levels    Myocarditis (HCC)    PSVT (paroxysmal supraventricular tachycardia)    Recurrent Pericarditis    RECURRENT    Thoracic aneurysm    a. Followed by [facility] -last seen in 12/2013 w/ plan for f/u MRA and thoracic surgery f/u in 2 yrs, but patient does not wish to follow any longer.    Past Surgical History:  Procedure Laterality Date   APPENDECTOMY     BREAST EXCISIONAL BIOPSY Left 1992   benign   EXCISION/RELEASE BURSA HIP Left 11/24/2012   Procedure: LEFT HIP BURSECTOMY AND TENDON REPAIR ;  Surgeon: Loanne Drilling, MD;  Location: [facility];  Service: Orthopedics;  Laterality: Left;   HIP CLOSED REDUCTION Left 12/19/2012   Procedure: CLOSED MANIPULATION HIP;  Surgeon: Shelda Pal, MD;  Location: [facility];  Service: Orthopedics;  Laterality: Left;   HIP CLOSED REDUCTION Left 12/05/2013   Procedure: CLOSED MANIPULATION HIP;  Surgeon: Verlee Rossetti, MD;  Location: [facility];  Service: Orthopedics;  Laterality: Left;   HIP CLOSED REDUCTION Left 09/22/2015   Procedure: CLOSED REDUCTION HIP;  Surgeon: Venita Lick, MD;  Location: [facility];  Service: Orthopedics;  Laterality: Left;   HIP CLOSED REDUCTION Left 12/05/2022   Procedure: CLOSED MANIPULATION HIP;  Surgeon: Ollen Gross, MD;  Location: [facility];  Service: Orthopedics;  Laterality: Left;   JOINT REPLACEMENT     LEFT TOTAL HIP REPLACEMENT AND REVISIONS X 2   OOPHORECTOMY     has a partial of one ovary remaingin   PELVIC LAPAROSCOPY     ovarian cyst removal,    REVISION TOTAL HIP ARTHROPLASTY     left   right Achilles Tendon repair  5/13   TOTAL KNEE ARTHROPLASTY     right   TOTAL KNEE ARTHROPLASTY      left   VAGINAL HYSTERECTOMY     with ovarian cyst removal    Social History:  reports that she quit smoking about 59 years ago. Her smoking use included cigarettes. She started smoking about 64 years ago. She has never used smokeless tobacco. She reports that she does not currently use drugs. She reports that she does not drink alcohol.   Allergies  Allergen Reactions   Cephalexin Other (See Comments)    Pt states this caused eye problems and STROKE-LIKE SYMPTOMS!!   Shellfish-Derived Products Hives and Rash   Clindamycin/Lincomycin Rash   Lincomycin Rash   Clindamycin Hcl Other (See Comments)    "Per pharmacist"   Fosfomycin Other (See Comments)    Disorientation   Levofloxacin Other (See Comments)    Reaction not recalled by the patient   Penicillamine Other (See Comments)    Reaction not recalled by the patient   Sugar-Protein-Starch Other (See Comments)    Sugary foods cause legs to cramp   Tape Other (See Comments)    Please use paper tape or Coban Wrap; patient's skin tears VERY easily!!  Ciprofloxacin Rash   Sulfa Antibiotics Hives and Rash   Sulfites Hives, Swelling and Rash    Family History  Problem Relation Age of Onset   Hypertension Mother    Stroke Mother    Heart attack Father 9   Heart failure Father    Heart failure Sister    Aortic aneurysm Sister    Aortic aneurysm Sister    Stroke Sister    Pneumonia Brother    Stroke Brother    ALS Other    Hyperlipidemia Daughter    Aortic aneurysm Son    Breast cancer Neg Hx     Family history reviewed and not pertinent    Prior to Admission medications   Medication Sig Start Date End Date Taking? Authorizing Provider  allopurinol (ZYLOPRIM) 100 MG tablet Take 50 mg by mouth daily.    [provider]  amoxicillin (AMOXIL) 500 MG capsule Take 2,000 mg by mouth See admin instructions. Take 2,000 mg by mouth one hour prior to dental procedures    [provider]  B Complex Vitamins  (VITAMIN B COMPLEX) TABS Take 1 tablet by mouth daily with breakfast.    [provider]  carvedilol (COREG) 12.5 MG tablet Take 12.5 mg by mouth 2 (two) times daily with a meal.    [provider]  carvedilol (COREG) 3.125 MG tablet TAKE ONE TABLET TWICE DAILY Patient not taking: Reported on 12/05/2022 10/29/22   Tereso Newcomer T, PA-C  Cholecalciferol (VITAMIN D3) 25 MCG (1000 UT) CAPS Take 1,000 Units by mouth daily.    [provider]  diltiazem (CARDIZEM CD) 240 MG 24 hr capsule Take 1 capsule (240 mg total) by mouth daily. 10/27/21   Lyn Records, MD  FEROSUL 325 (65 Fe) MG tablet Take 650 mg by mouth daily with breakfast.    [provider]  furosemide (LASIX) 20 MG tablet Take 1 tablet (20 mg total) by mouth every other day. 04/11/22 04/11/23  Pokhrel, Rebekah Chesterfield, MD  losartan (COZAAR) 25 MG tablet Take 1 tablet (25 mg total) by mouth daily. 04/11/22 12/05/22  Pokhrel, Laxman, MD  MITIGARE 0.6 MG CAPS Take 0.6 mg by mouth in the morning.    [provider]  Multiple Vitamins-Minerals (SYSTANE ICAPS AREDS2 PO) Take 1 capsule by mouth in the morning and at bedtime.    [provider]  sodium chloride 1 g tablet Take 1 g by mouth daily. 08/21/19   [provider]  triamcinolone cream (KENALOG) 0.1 % Apply topically 3 (three) times daily. To areas of psoariatic rash. Do not apply to face. 10/25/22   Johney Maine, MD  TYLENOL 500 MG tablet Take 500-1,000 mg by mouth every 6 (six) hours as needed for mild pain or headache.    [provider]     Objective    Physical Exam: Vitals:   12/06/22 2130 12/06/22 2145 12/06/22 2221 12/06/22 2230  BP: (!) 157/74 (!) 162/83  139/72  Pulse: 90 (!) 106 (!) 107   Temp:      TempSrc:      SpO2: 99% 98% 98%     General: appears to be stated age; confused  skin: warm, multiple skin tears associated with the bilateral lower extremities, with some associated ecchymosis Head:   AT/[place] Mouth:  Oral mucosa membranes appear moist, normal dentition Neck: supple; trachea midline Heart:  RRR; did not appreciate any M/R/G Lungs: CTAB, did not appreciate any wheezes, rales, or rhonchi Abdomen: + BS; soft,  ND, NT Vascular: 2+ pedal pulses b/l; 2+ radial pulses b/l Extremities: no muscle wasting; bilateral lower extremity skin tears, ecchymosis noted, as further detailed above Neuro: strength and sensation intact in upper and lower extremities b/l     Labs on Admission: I have personally reviewed following labs and imaging studies  CBC: Recent Labs  Lab 12/05/22 0300 12/06/22 2127  WBC 10.4 11.3*  NEUTROABS 6.9 7.7  HGB 8.9* 7.8*  HCT 27.8* 24.8*  MCV 86.3 86.7  PLT 330 281   Basic Metabolic Panel: Recent Labs  Lab 12/05/22 0300 12/06/22 2127  NA 134* 133*  K 3.9 4.0  CL 104 102  CO2 21* 22  GLUCOSE 105* 106*  BUN 47* 35*  CREATININE 0.97 1.21*  CALCIUM 8.5* 8.4*   GFR: Estimated Creatinine Clearance: 27.3 mL/min (A) (by C-G formula based on SCr of 1.21 mg/dL (H)). Liver Function Tests: No results for input(s): "AST", "ALT", "ALKPHOS", "BILITOT", "PROT", "ALBUMIN" in the last 168 hours. No results for input(s): "LIPASE", "AMYLASE" in the last 168 hours. No results for input(s): "AMMONIA" in the last 168 hours. Coagulation Profile: No results for input(s): "INR", "PROTIME" in the last 168 hours. Cardiac Enzymes: No results for input(s): "CKTOTAL", "CKMB", "CKMBINDEX", "TROPONINI" in the last 168 hours. BNP (last 3 results) No results for input(s): "PROBNP" in the last 8760 hours. HbA1C: No results for input(s): "HGBA1C" in the last 72 hours. CBG: No results for input(s): "GLUCAP" in the last 168 hours. Lipid Profile: No results for input(s): "CHOL", "HDL", "LDLCALC", "TRIG", "CHOLHDL", "LDLDIRECT" in the last 72 hours. Thyroid Function Tests: No results for input(s): "TSH", "T4TOTAL", "FREET4", "T3FREE", "THYROIDAB" in the last 72  hours. Anemia Panel: No results for input(s): "VITAMINB12", "FOLATE", "FERRITIN", "TIBC", "IRON", "RETICCTPCT" in the last 72 hours. Urine analysis:    Component Value Date/Time   COLORURINE YELLOW 12/06/2022 2232   APPEARANCEUR HAZY (A) 12/06/2022 2232   LABSPEC 1.019 12/06/2022 2232   PHURINE 5.0 12/06/2022 2232   GLUCOSEU NEGATIVE 12/06/2022 2232   HGBUR NEGATIVE 12/06/2022 2232   BILIRUBINUR NEGATIVE 12/06/2022 2232   KETONESUR NEGATIVE 12/06/2022 2232   PROTEINUR 30 (A) 12/06/2022 2232   UROBILINOGEN 0.2 04/17/2021 1314   NITRITE NEGATIVE 12/06/2022 2232   LEUKOCYTESUR SMALL (A) 12/06/2022 2232    Radiological Exams on Admission: CT Head Wo Contrast  Result Date: 12/07/2022 CLINICAL DATA:  Mental status change EXAM: CT HEAD WITHOUT CONTRAST TECHNIQUE: Contiguous axial images were obtained from the base of the skull through the vertex without intravenous contrast. RADIATION DOSE REDUCTION: This exam was performed according to the departmental dose-optimization program which includes automated exposure control, adjustment of the mA and/or kV according to patient size and/or use of iterative reconstruction technique. COMPARISON:  CT brain 04/07/2022, MRI 03/06/2022 FINDINGS: Brain: No acute territorial infarction, hemorrhage or intracranial mass. Chronic small vessel ischemic changes of the white matter and atrophy. Similar ventricular enlargement somewhat out of proportion to atrophy. Vascular: No hyperdense vessels.  Carotid vascular calcification Skull: Normal. Negative for fracture or focal lesion. Sinuses/Orbits: No acute finding. Other: None IMPRESSION: 1. No CT evidence for acute intracranial abnormality. 2. Chronic small vessel ischemic changes of the white matter and atrophy. 3. Similar ventricular enlargement somewhat out of proportion to atrophy. Findings could be due to normal pressure hydrocephalus superimposed on atrophy and chronic microvascular disease, this finding is  unchanged Electronically Signed   By: Jasmine Pang M.D.   On: 12/07/2022 00:20   DG CHEST PORT 1 VIEW  Result  Date: 12/07/2022 CLINICAL DATA:  Altered mental status EXAM: PORTABLE CHEST 1 VIEW COMPARISON:  08/23/2022, CT 06/13/2022 FINDINGS: Underlying chronic interstitial lung disease with fibrosis and ground-glass density. No acute superimposed airspace disease. Stable cardiomediastinal silhouette. No pneumothorax IMPRESSION: Chronic interstitial lung disease without definitive acute superimposed airspace disease. Electronically Signed   By: Jasmine Pang M.D.   On: 12/07/2022 00:03   DG C-Arm 1-60 Min-No Report  Result Date: 12/05/2022 Fluoroscopy was utilized by the requesting physician.  No radiographic interpretation.   DG Femur Min 2 Views Left  Result Date: 12/05/2022 CLINICAL DATA:  89 year old female with history of dislocation of the left prosthetic fifth status post reduction. EXAM: LEFT FEMUR 2 VIEWS COMPARISON:  Left hip radiograph 12/05/2022. FINDINGS: Postoperative changes of left hip total arthroplasty and left knee total arthroplasty are noted. No periprosthetic fractures are identified. Femoral shaft appears intact. Femoral head is now dislocated inferior, medial and anteriorly relative to the prosthetic acetabular cup. IMPRESSION: 1. Persistent dislocation of the left prosthetic femoral head relative to the prosthetic acetabular cup, as above. 2. No other acute abnormality of the left femur. Electronically Signed   By: Trudie Reed M.D.   On: 12/05/2022 06:01   DG Pelvis 1-2 Views  Result Date: 12/05/2022 CLINICAL DATA:  89 year old female with history of left hip dislocation status post closed reduction. EXAM: PELVIS - 1-2 VIEW COMPARISON:  12/05/2022 at 3:39 a.m. FINDINGS: Prosthetic femoral head remains dislocated, now inferomedially on this single view examination. IMPRESSION: 1. Persistent now inferomedial dislocation of the prosthetic left femoral head relative to the  prosthetic acetabulum. Electronically Signed   By: Trudie Reed M.D.   On: 12/05/2022 05:59   DG Hip Unilat W or Wo Pelvis 2-3 Views Left  Result Date: 12/05/2022 CLINICAL DATA:  89 year old female with history of left hip dislocation after sitting in a chair. EXAM: DG HIP (WITH OR WITHOUT PELVIS) 2-3V LEFT COMPARISON:  09/10/2022. FINDINGS: Patient is status post left hip arthroplasty. No periprosthetic fracture identified. The prosthetic femoral head is dislocated superiorly and laterally relative to the acetabulum, with proximal migration of the femur. Bony pelvic ring appears intact, as do the visualized portions of the right proximal femur. IMPRESSION: 1. Status post left hip arthroplasty with dislocation of the prosthetic femoral head relative to the prosthetic acetabular cup, as above. Electronically Signed   By: Trudie Reed M.D.   On: 12/05/2022 05:05      Assessment/Plan    Principal Problem:   Acute encephalopathy Active Problems:   Essential hypertension   Anemia of chronic disease   Multiple skin tears   Generalized weakness   Dehydration   Acute prerenal azotemia   Leukocytosis   Chronic hyponatremia     #) Acute encephalopathy: 1 day of confusion relative to baseline mental status.  Etiology not entirely clear at this time, although differential includes potential metabolic contributions from clinical evidence of dehydration, interval worsening in renal function that does not meet criteria for acute kidney injury.  No overt evidence of underlying infectious process at this time, including UA that was inconsistent with UTI, while chest x-ray shows no evidence of acute cardiopulmonary process, including no evidence of infiltrate.  Will the patient does have multiple skin tears on her bilateral lower extremities, these do not appear infected at this time, will closely monitor ensuing trend of such as outlined below.  In the context of a known history of chronic  hyponatremia, her presenting serum sodium appears to be consistent with baseline  range.  No obvious contributory pharmacologic factors, aside from potential residual anesthesia stemming from left hip reduction under general anesthesia with interval decline in renal function. No overt acute focal neurologic deficits to suggest a contribution from an underlying acute CVA, while also noting that today CT head showed no evidence of acute intracranial process. Seizures are also felt to be less likely. Will check VBG to evaluate for any contribution from hypercapnic encephalopathy noting that the patient is a former smoker.   Plan: fall precautions. Delirium precautions. Repeat CMP/CBC in the AM. Check Mg level. check TSH, B12 level. Check vbg.  Check CPK level.  Gentle IV fluids.  Check ammonia.  Wound care consult for bilateral lower extremity skin tears.                #) Bilateral lower extremity wound/skin tears: As documented above, related to process of recent reduction of left hip dislocation, as further detailed above, without clinical evidence to suggest associated infection at this time.  Of note, in the absence of open cell count greater than 12,000 and in the absence of objective fever, surgical treatment not met for sepsis at this time.  Plan: Wound care consult has been placed.  Check INR.  Prn acetaminophen.  Repeat CBC in the morning.                 #) Dehydration: Clinical suspicion for such, including the appearance of dry oral mucous membranes as well as laboratory findings notable for acute prerenal azotemia. Appears to be in the setting of   diminished oral intake over the last day.  No e/o associated hypotension.   Plan: Monitor strict I's and O's.  Daily weights.  CMP in the morning. IVF's in form of lactated Ringer's at 50 cc/h x 8 hours.  Hold next dose of Lasix.Marland Kitchen                     #) Leukocytosis: Presenting CBC reflects mildly  elevated white cell count of 11,300, which does not meet quantitative threshold for inclusion in SIRS criteria (> 12,000). Suspect an element of hemoconcentration in the setting of clinical evidence of dehydration, as well as the possibility of reactive/inflammatory contribution in the context of recent left hip dislocation and ensuing skin tears, without evidence of associated infection. Appears hemodynamically stable.  Therefore, will refrain from initiation of antibiotics at this time.  Plan: Repeat CBC with diff in the morning.  Monitor strict I's and O's, daily weights.  Gentle IV fluids, as above.  Wound care relating to her bilateral lower extremity skin tears, as above.                  #) Generalized weakness: 1 day duration of generalized weakness, in the absence of any evidence of acute focal neurologic deficits, including no evidence of acute focal weakness to suggest acute CVA, also noting CT head that showed no evidence of acute intracranial process.  Suspect contribution from physiologic stress stemming from presenting dehydration.   Will further eval for any additional contributions from endocrine/metabolic sources, as detailed below.   Plan: work-up and management of presenting dehydration, as described above. PT/OT consults ordered for the AM. Fall precautions. CMP/CBC in the AM. Check TSH, serum Mg level. Check CPK level.Marland Kitchen                  #) Essential Hypertension: documented h/o such, with outpatient antihypertensive regimen including Coreg, diltiazem, losartan.  SBP's  in the ED today: 130s 150s mmHg. will refrain from simultaneous resumption of both of her outpatient heart rate control medications.  Additionally, in the setting of interval increase in serum creatinine that does not meet threshold for AKI, will hold home losartan for now as well.  Plan: Close monitoring of subsequent BP via routine VS. holding losartan and Coreg for now.  Resume  diltiazem.                #) Anemia of chronic disease: Documented history of such, a/w with baseline hgb range 8-10, with presenting hgb consistent with this range, in the absence of any overt evidence of active bleed, with the exception of some mild ecchymosis, superficial dried blood noted to be associated with her skin tears on the bilateral lower extremities, as further detailed above.  She is noted to be on daily oral iron supplementation at home.   Plan: Repeat CBC in the morning.  Check INR.  Continue outpatient daily oral iron supplementation.                 #) Chronic hypoosmolar hyponatremia: Documented history of such, with baseline serum sodium range of 1 30-1 34 noted since September 2023, with presenting serum sodium level consistent with this baseline range.  Plan: Monitor strict I's and O's and daily weights.  Repeat CMP in the morning.       DVT prophylaxis: SCD's   Code Status: DNR/DNI, consistent with code status documentation from two most rec hospitalizationsent  Family Communication: none Disposition Plan: Per Rounding Team Consults called: none;  Admission status: Inpatient    I SPENT GREATER THAN 75  MINUTES IN CLINICAL CARE TIME/MEDICAL DECISION-MAKING IN COMPLETING THIS ADMISSION.     Chaney Born Doral Ventrella DO [facility] From 7PM - 7AM   12/07/2022, 12:48 AM

## 2022-12-07 NOTE — Plan of Care (Signed)
Problem: Safety: Goal: Ability to remain free from injury will improve Outcome: Progressing   Problem: Safety: Goal: Ability to remain free from injury will improve Outcome: Progressing

## 2022-12-07 NOTE — TOC Initial Note (Addendum)
Transition of Care [facility]) - Initial/Assessment Note    Patient Details  Name: [name] MRN: [id number] Date of Birth: Dec 12, 1929  Transition of Care [facility]) CM/SW Contact:    Darleene Cleaver, LCSW Phone Number: 12/07/2022, 11:52 AM  Clinical Narrative:                  CSW attempted to see patient, but she was using the bathroom.  CSW spoke to patient's son Thurmond Butts to complete assessment for patient.  Patient is alert and oriented x4, and lives at [place].  Patient has caregivers that stay with her for a few hours a day.  Patient is not currently receiving HH services.  Patient is normally independent with walker.  Meals  are provided for her by dining services at [facility].  Patient is normally able to complete most tasks on her own.  Per patient's son, she would prefer to return home with home health if possible, but if she needs to go to a SNF, patient may be agreeable for a few days.  Per patient's son, she was in the ED a couple of days ago, went home and was brought right back yesterday.  Patient was seen by ortho who were able to fix patient's artificial hip dislocation.  Patient is currently being seen by [facility] nurse for dressing changes.  Patient's son would like to be updated by physician, CSW notified attending physician.  PT and OT has been ordered, CSW awaiting recommendations from PT and OT.  TOC to continue to follow patient's progress throughout discharge planning.  1:45pm CSW received phone call from patient's son Thurmond Butts who asked if PT had seen patient yet.  CSW reviewed chart and they did see her and recommended SNF placement.  CSW met with patient and her son to discuss rehab at [facility] and explain the process.  CSW informed patient and son that she is under the [facility]  Medicare Waiver program, which means patient is eligible to go to selected SNFs without having a qualifying stay at the hospital.  CSW provided list of facilities that patient may qualify for.   Patient's son asked if a referral could be made for [facility] [place], since it is only a couple of miles from where son lives.  CSW informed him that patient would have to have a qualifying stay in order to be accepted at the facility and they will have to agree to accept her.  CSW informed him that Medicare will only pay for transport up to 50 miles.  Patient's son expressed understanding and said that he would like to transport her if the facility is able to accept patient.  CSW contacted Christene Lye the admissions worker at [facility] at [phone], and she requested the patient's facesheet, H and P, MD note, FL2, and therapy notes to be faxed to her at [phone], CSW faxed requested documents.  3:00pm CSW spoke to patient and son again, and she is requesting [facility] if [facility] is not able to accept patient.  CSW contacted Whitney at [facility], [phone], and she said she does not have any beds today, but to have TOC call on Wednesday around 10:30 to see if a bed has opened up.  TOC to reach out to [facility], and also provide other bed offers if patient is not able to go to [facility].  TOC to continue to follow patient's progress throughout discharge planning.  Expected Discharge Plan: Skilled Nursing Facility Barriers to Discharge: Continued Medical  Work up   Patient Goals and CMS Choice Patient states their goals for this hospitalization and ongoing recovery are:: To return back to [facility] or going to SNF for short term rehab. CMS Medicare.gov Compare Post Acute Care list provided to:: Patient Represenative (must comment) Choice offered to / list presented to : Adult Children [facility]oco ownership interest in [facility].provided to:: Adult Children    Expected Discharge Plan and Services In-house Referral: Clinical Social Work   Post Acute Care Choice: Home Health, Skilled Nursing  Facility Living arrangements for the past 2 months: Independent Living Facility                                      Prior Living Arrangements/Services Living arrangements for the past 2 months: Independent Living Facility Lives with:: Self Patient language and need for interpreter reviewed:: Yes Do you feel safe going back to the place where you live?: Yes      Need for Family Participation in Patient Care: Yes (Comment) Care giver support system in place?: Yes (comment) Current home services: Homehealth aide, DME Criminal Activity/Legal Involvement Pertinent to Current Situation/Hospitalization: No - Comment as needed  Activities of Daily Living Home Assistive Devices/Equipment: None ADL Screening (condition at time of admission) Does the patient have a NEW difficulty with bathing/dressing/toileting/self-feeding that is expected to last >3 days?: Yes (Initiates electronic notice to provider for possible OT consult) (left hip repair 12/05/2022) Does the patient have a NEW difficulty with getting in/out of bed, walking, or climbing stairs that is expected to last >3 days?: Yes (Initiates electronic notice to provider for possible PT consult) Does the patient have a NEW difficulty with communication that is expected to last >3 days?: No Is the patient deaf or have difficulty hearing?: No Does the patient have difficulty seeing, even when wearing glasses/contacts?: No Does the patient have difficulty concentrating, remembering, or making decisions?: No  Permission Sought/Granted Permission sought to share information with : Case Manager, Magazine features editor, Family Supports Permission granted to share information with : Yes, Release of Information Signed, Yes, Verbal Permission Granted  Share Information with NAME: Hiram Comber [phone]  [phone]  WOLBERT(Daughter in law),GWYN Other [phone]    [facility] Daughter [phone]  Permission granted to  share info w AGENCY: SNF admissions  Permission granted to share info w Relationship: SNF admissions and [facility] agencies     Emotional Assessment Appearance:: Appears stated age Attitude/Demeanor/Rapport: Engaged Affect (typically observed): Accepting, Appropriate, Calm, Pleasant, Stable Orientation: : Oriented to Self, Oriented to Place, Oriented to  Time, Oriented to Situation Alcohol / Substance Use: Not Applicable Psych Involvement: No (comment)  Admission diagnosis:  Acute encephalopathy [G93.40] Patient Active Problem List   Diagnosis Date Noted   Acute encephalopathy 12/07/2022   Dehydration 12/07/2022   Acute prerenal azotemia 12/07/2022   Leukocytosis 12/07/2022   Chronic hyponatremia 12/07/2022   ILD (interstitial lung disease) (HCC) 10/20/2022   CAP (community acquired pneumonia) 06/12/2022   Chronic heart failure with preserved ejection fraction (HFpEF) (HCC) 04/07/2022   Advanced care planning/counseling discussion 04/07/2022   Chronic venous stasis dermatitis of both lower extremities 04/07/2022   Malaise and fatigue 07/15/2021   Primary osteoarthritis 04/20/2021   Inflammatory polyarthropathy (HCC) 04/20/2021   Hyperuricemia 04/20/2021   Generalized weakness 04/20/2021   Chronic kidney disease, stage 3 unspecified (HCC) 04/20/2021   Adjustment disorder with mixed disturbance of  emotions and conduct 01/20/2021   Severe recurrent major depression without psychotic features (HCC) 01/19/2021   Pincer nail deformity 12/01/2020   Iron deficiency anemia 03/24/2020   Clavi 02/19/2020   Lumbar radiculopathy 06/17/2019   Pain due to onychomycosis of toenails of both feet 05/14/2019   Mixed incontinence 03/10/2019   Cervical spondylosis 06/07/2018   DDD (degenerative disc disease), cervical 05/20/2018   History of total knee replacement, left 04/13/2018   History of total knee replacement, right 04/13/2018   History of revision of total replacement of left hip joint  04/13/2018   Gait abnormality 09/08/2017   Chronic cystitis 07/05/2017   Dysuria 07/05/2017   Anemia of chronic disease 09/22/2015   h/o pericarditis    Essential hypertension    Aortic insufficiency    Posterior vitreous detachment, bilateral 06/05/2015   Left wrist pain 05/16/2015   Triquetral chip fracture, left, closed, initial encounter 05/16/2015   Primary osteoarthritis of first carpometacarpal joint of right hand 02/11/2015   Primary osteoarthritis of right wrist 02/11/2015   Arthropathy 11/25/2014   Multiple skin tears 11/25/2014   Hip dislocation, left (HCC) 12/05/2013   Trochanteric bursitis of left hip 11/24/2012   Aneurysm of thoracic aorta (HCC)    Nonexudative age-related macular degeneration 04/22/2011   Posterior capsular opacification, right 04/22/2011   Status post intraocular lens implant 04/22/2011   PCP:  Emilio Aspen, MD Pharmacy:   [facility] - [place], [place] - [address] [place] [address] Phone: [phone] Fax: [phone]     Social Determinants of Health (SDOH) Social History: SDOH Screenings   Food Insecurity: No Food Insecurity (12/07/2022)  Housing: Patient Declined (12/07/2022)  Transportation Needs: No Transportation Needs (12/07/2022)  Utilities: Not At Risk (12/07/2022)  Alcohol Screen: Low Risk  (01/19/2021)  Depression (PHQ2-9): Low Risk  (06/10/2021)  Tobacco Use: Medium Risk (12/07/2022)   SDOH Interventions:     Readmission Risk Interventions     No data to display

## 2022-12-07 NOTE — Consult Note (Signed)
WOC Nurse Consult Note: patient denies falls or trauma; states L hip popped out of place; Left upper and lower leg with multiple skin tears  Reason for Consult: skin tears L leg  Wound type: 1.  Partial thickness skin loss r/t trauma  2.  Full thickness skin loss ? R/t trauma  Pressure Injury POA: NA not pressure related  Measurement: 1.  Partial thickness skin loss L upper thigh 6 cm x 3 cm area with flap covering; 4 cm x 4 cm with intact flap  2.  Full thickness skin loss L medial leg (starting at knee and proceeding distal) 13 cm x 6 cm partial skin flap intact  Wound bed: 100% pink moist  Drainage (amount, consistency, odor) serosanguinous from L medial lower leg; upper L leg wounds dry  Periwound: ecchymosis  Dressing procedure/placement/frequency:  Clean skin tears to L upper medial thigh and lower medial leg with NS, apply vaseline gauze [place] #[phone]) to wound beds every other day, cover with Telfa nonstick dressing, ABD pad and Kerlix roll gauze.  IF DRESSINGS STUCK TO WOUND SOAK WITH NS FOR ATRAUMATIC REMOVAL.   Apply Eucerin to intact skin of feet and legs 2 times daily.    POC discussed with patient and bedside nurse.  WOC nurse performed wound care to Left leg at this visit.    WOC team will not follow at this time. Re-consult if further needs arise.   Thank you,    Priscella Mann MSN, RN-BC, [employer] [phone]

## 2022-12-07 NOTE — Evaluation (Signed)
Physical Therapy Evaluation Patient Details Name: [name] MRN: [id number] DOB: 09-29-1929 Today's Date: 12/07/2022  History of Present Illness  89 yo female presents to therapy following hospital admission on 12/06/2022 due to new onset of confusion and generalized weakness. Pt recently hospitalized due to L hip dislocation secondary to trunk and hip flexion during ADLs unable to reduce in ED requiring closed reduction in OR and pt d/c to [facility] on 12/05/2022. Pt found to have acute encephalopathy, dehydration, mild leukocytosis and generalized weakness with B LE skin tears attributed to most recent L hip reduction. 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  Clinical Impression     Pt admitted with above diagnosis.  Pt currently with functional limitations due to the deficits listed below (see PT Problem List). Pt in bed when PT arrived. Pt reported need to use the bathroom. Pt required mod A for supine to sit with cues and attempts to limit shearing forces on B LE secondary to wounds as well as maintain L posterior hip precautions. . Pt required min A for sit to stand  from EOB and increased time with cues and pt limiting L LE WB, hip and knee flexion with L LE in slight abduction with wide BOS with multiple standing bouts with pt repeating it hurts to navigate 15 feet to the bathroom with RW. Pt unable to sit on standard commode, PT placed elevated commode seat and pt required increased time, several attempts and specific cues for LLE placement. Once seated on commode pt reported need to void bowels. PT provided cues for pt to avoid straining and pt indicated she was unable to complete bowel movement, PT made several attempts to redirect pt and assist with hygiene tasks in order to sit in recliner. Pt declined reporting she needed to sit and get it out, PT  made pt aware she may be constipated and nursing staff made aware and arrived to assist with redirection process during that time pt indicated her L LE hurt even worse and ed provided on discomfort associated with prolonged sitting on commode > 20 mins increasing pt pain and weakness. Nurse assisted pt with sit to stand  from elevated commode seat and peri care and provided pt ed on need for stool softener and increased fluids to facilitate bowel movement. During the course of amb from commode to recliner placed outside pts room pt began to insist on standing to brush her teeth. Nurse and PT indicated pt can perform task while seated in recliner. Once pt seated in recliner pt indicated that she really needed to sit down. Pt left seated in recliner all needs in place and staff aware of pt request for assist/set up with oral hygrine. Pt  appears to be unaware of posterior hip precautions and will require reinforcement for recall and observation with functional mobility tasks. Patient will benefit from continued follow up therapy, <3 hours/day. Pt will benefit from acute skilled PT to increase their independence and safety with mobility to allow discharge.         If plan is discharge home, recommend the following: A little help with walking and/or transfers;A little help with bathing/dressing/bathroom;Assistance with cooking/housework;Assist for transportation;Help with stairs or ramp for entrance;Supervision due to cognitive status   Can travel by private vehicle        Equipment Recommendations None recommended by PT  Recommendations for  Other Services       Functional Status Assessment Patient has had a recent decline in their functional status and demonstrates the ability to make significant improvements in function in a reasonable and predictable amount of time.     Precautions / Restrictions Precautions Precautions: Fall;Posterior Hip Precaution Booklet Issued: Yes (comment) Precaution  Comments: Educated pt in depth on posterior hip precautions with handout provided.  After education, pt able to recall 1/3 after increased time. Restrictions Weight Bearing Restrictions: No      Mobility  Bed Mobility Overal bed mobility: Needs Assistance Bed Mobility: Supine to Sit     Supine to sit: Mod assist, HOB elevated, Used rails     General bed mobility comments: increased assist to limit shearing forces on LE wounds    Transfers Overall transfer level: Needs assistance Equipment used: Rolling walker (2 wheels) Transfers: Sit to/from Stand Sit to Stand: Min assist           General transfer comment: min A and cues with pt indicating that her L LE hurts with WB    Ambulation/Gait Ambulation/Gait assistance: Contact guard assist Gait Distance (Feet): 15 Feet Assistive device: Rolling walker (2 wheels) Gait Pattern/deviations: Step-to pattern, Decreased stance time - left, Shuffle, Trunk flexed Gait velocity: decreased     General Gait Details: pt limiting LLE WB, knee and hip flexion with L LE abducted when walking, and reporting it hurts  Stairs            Wheelchair Mobility     Tilt Bed    Modified Rankin (Stroke Patients Only)       Balance Overall balance assessment: Needs assistance Sitting-balance support: Feet supported, No upper extremity supported Sitting balance-Leahy Scale: Good Sitting balance - Comments: Sat edge of recliner forgrooming without LOB.   Standing balance support: Bilateral upper extremity supported, During functional activity, Reliant on assistive device for balance Standing balance-Leahy Scale: Poor                               Pertinent Vitals/Pain Pain Assessment Pain Assessment: Faces Faces Pain Scale: Hurts even more Pain Location: B LEs L > R due to wounds and recent open L hip reduction. LT LE when lowering recliner legs to floor. Pain Descriptors / Indicators: Aching, Constant,  Discomfort Pain Intervention(s): Limited activity within patient's tolerance, Monitored during session, Repositioned    Home Living Family/patient expects to be discharged to:: Private residence Living Arrangements: Alone Available Help at Discharge: Family Type of Home: Apartment Home Access: Level entry       Home Layout: One level Home Equipment: Agricultural consultant (2 wheels);Rollator (4 wheels);Grab bars - toilet;Grab bars - tub/shower;Shower seat - built Scientist, clinical (histocompatibility and immunogenetics)      Prior Function Prior Level of Function : Needs assist       Physical Assist : ADLs (physical)   ADLs (physical): IADLs Mobility Comments: mod I with rollator for mobility in apartment and facility, mod I for ADLs, self care tasks ADLs Comments: [facility] provides meals, housekeeping, and transportation. Pt does own laundry.     Extremity/Trunk Assessment   Upper Extremity Assessment Upper Extremity Assessment:  (Bil scapular winging. RT>LT) RUE Deficits / Details: "Bad shoulder" with limited ROM.  Elbow to grip: WFL with hand deformities from arthritis. Elbow->Grip: 3+/5 RUE Sensation: WNL LUE Deficits / Details: Shoulder and Elbow to grip: WFL with hand deformities from arthritis. Elbow->Grip: 3+/5 LUE Sensation: WNL  Lower Extremity Assessment Lower Extremity Assessment: LLE deficits/detail LLE Deficits / Details: LLE edema noted, MMT NT due to recent open L hip reduction, reports of pain and limited L hip  and knee flexion with mobility tasks LLE Sensation: WNL    Cervical / Trunk Assessment Cervical / Trunk Assessment: Kyphotic  Communication   Communication Communication: No apparent difficulties Cueing Techniques: Verbal cues;Visual cues;Gestural cues  Cognition Arousal: Alert Behavior During Therapy: WFL for tasks assessed/performed Overall Cognitive Status: No family/caregiver present to determine baseline cognitive functioning Area of Impairment: Orientation, Memory,  Attention, Safety/judgement, Awareness                 Orientation Level: Disoriented to, Situation Current Attention Level: Focused Memory: Decreased recall of precautions, Decreased short-term memory   Safety/Judgement: Decreased awareness of safety, Decreased awareness of deficits Awareness: Intellectual   General Comments: pt is very difficult to redirect and exhibits poor self awareness of deficits        General Comments General comments (skin integrity, edema, etc.): B LE wounds and L LE edema    Exercises     Assessment/Plan    PT Assessment Patient needs continued PT services  PT Problem List Decreased strength;Decreased range of motion;Decreased activity tolerance;Decreased balance;Decreased mobility;Decreased coordination;Pain       PT Treatment Interventions DME instruction;Gait training;Functional mobility training;Therapeutic activities;Therapeutic exercise;Balance training;Neuromuscular re-education;Patient/family education    PT Goals (Current goals can be found in the Care Plan section)  Acute Rehab PT Goals PT Goal Formulation: Patient unable to participate in goal setting    Frequency Min 1X/week     Co-evaluation               AM-PAC PT "6 Clicks" Mobility  Outcome Measure Help needed turning from your back to your side while in a flat bed without using bedrails?: A Little Help needed moving from lying on your back to sitting on the side of a flat bed without using bedrails?: A Little Help needed moving to and from a bed to a chair (including a wheelchair)?: A Little Help needed standing up from a chair using your arms (e.g., wheelchair or bedside chair)?: A Little Help needed to walk in hospital room?: A Little Help needed climbing 3-5 steps with a railing? : Total 6 Click Score: 16    End of Session Equipment Utilized During Treatment: Gait belt Activity Tolerance: Patient limited by pain   Nurse Communication: Mobility  status;Other (comment) (pt having difficulty voiding bowels) PT Visit Diagnosis: Unsteadiness on feet (R26.81);Other abnormalities of gait and mobility (R26.89);Muscle weakness (generalized) (M62.81);Difficulty in walking, not elsewhere classified (R26.2);Pain Pain - Right/Left: Left Pain - part of body: Leg;Hip    Time: 1610-9604 PT Time Calculation (min) (ACUTE ONLY): 53 min   Charges:   PT Evaluation $PT Eval Low Complexity: 1 Low PT Treatments $Gait Training: 8-22 mins $Therapeutic Activity: 23-37 mins PT General Charges $$ ACUTE PT VISIT: 1 Visit         Johnny Bridge, PT Acute Rehab   Jacqualyn Posey 12/07/2022, 1:24 PM

## 2022-12-08 DIAGNOSIS — I10 Essential (primary) hypertension: Secondary | ICD-10-CM | POA: Diagnosis not present

## 2022-12-08 DIAGNOSIS — T148XXA Other injury of unspecified body region, initial encounter: Secondary | ICD-10-CM | POA: Diagnosis not present

## 2022-12-08 DIAGNOSIS — G9340 Encephalopathy, unspecified: Secondary | ICD-10-CM | POA: Diagnosis not present

## 2022-12-08 DIAGNOSIS — E860 Dehydration: Secondary | ICD-10-CM | POA: Diagnosis not present

## 2022-12-08 LAB — BASIC METABOLIC PANEL
Anion gap: 10 (ref 5–15)
BUN: 30 mg/dL — ABNORMAL HIGH (ref 8–23)
CO2: 20 mmol/L — ABNORMAL LOW (ref 22–32)
Calcium: 8.4 mg/dL — ABNORMAL LOW (ref 8.9–10.3)
Chloride: 102 mmol/L (ref 98–111)
Creatinine, Ser: 0.96 mg/dL (ref 0.44–1.00)
GFR, Estimated: 56 mL/min — ABNORMAL LOW (ref >60)
Glucose, Bld: 170 mg/dL — ABNORMAL HIGH (ref 70–99)
Potassium: 4.1 mmol/L (ref 3.5–5.1)
Sodium: 132 mmol/L — ABNORMAL LOW (ref 135–145)

## 2022-12-08 LAB — CBC WITH DIFFERENTIAL/PLATELET
Abs Immature Granulocytes: 0.1 10*3/uL — ABNORMAL HIGH (ref 0.00–0.07)
Basophils Absolute: 0.1 10*3/uL (ref 0.0–0.1)
Basophils Relative: 1 %
Eosinophils Absolute: 0.5 10*3/uL (ref 0.0–0.5)
Eosinophils Relative: 6 %
HCT: 25.7 % — ABNORMAL LOW (ref 36.0–46.0)
Hemoglobin: 7.9 g/dL — ABNORMAL LOW (ref 12.0–15.0)
Immature Granulocytes: 1 %
Lymphocytes Relative: 13 %
Lymphs Abs: 1.2 10*3/uL (ref 0.7–4.0)
MCH: 27.6 pg (ref 26.0–34.0)
MCHC: 30.7 g/dL (ref 30.0–36.0)
MCV: 89.9 fL (ref 80.0–100.0)
Monocytes Absolute: 1.1 10*3/uL — ABNORMAL HIGH (ref 0.1–1.0)
Monocytes Relative: 12 %
Neutro Abs: 6.2 10*3/uL (ref 1.7–7.7)
Neutrophils Relative %: 67 %
Platelets: 293 10*3/uL (ref 150–400)
RBC: 2.86 MIL/uL — ABNORMAL LOW (ref 3.87–5.11)
RDW: 15.7 % — ABNORMAL HIGH (ref 11.5–15.5)
WBC: 9.2 10*3/uL (ref 4.0–10.5)
nRBC: 0 % (ref 0.0–0.2)

## 2022-12-08 LAB — GLUCOSE, CAPILLARY: Glucose-Capillary: 103 mg/dL — ABNORMAL HIGH (ref 70–99)

## 2022-12-08 LAB — CK: Total CK: 185 U/L (ref 38–234)

## 2022-12-08 NOTE — Progress Notes (Signed)
PROGRESS NOTE    [name]  GEX:[id number] DOB: 06-Feb-1930 DOA: 12/06/2022 PCP: Darlene Aspen, MD   Chief Complaint  Patient presents with   Leg Pain    Brief Narrative:  [name] is a 89 y.o. female with PMH significant for HTN, PSVT, CKD, chronic anemia, chronic hyponatremia, aortic insufficiency, thoracic aneurysm, GERD, arthritis, depression, h/o multiple hip surgeries and dislocations in the past.  He lives at [facility] assisted living facility. 9/22, patient was brought to the ED for left hip dislocation that he suffered while bending over. Reduction was attempted but unsuccessful in the ED.  She was taken to the OR and underwent closed reduction by Dr. Lequita Halt.  She was subsequent discharged back to ALF the same day. In the subsequent 24 hours, family noted patient was more confused, weak there is also noted multiple skin tears in her lower extremities suspected to have sustained during the reduction attempts 9/23, patient was brought to the ED again.   In the ED, patient was afebrile, blood pressure in 150s Labs showed WBC count of 11.3, hemoglobin 7.8, BN/creatinine 35/1.1, sodium 133  Urinalysis showed hazy yellow urine with small leukocytes CT head did not show any acute intracranial normality, showed chronic small vessel ischemic changes Admitted to [facility]   Assessment & Plan:   Principal Problem:   Acute encephalopathy Active Problems:   Essential hypertension   Anemia of chronic disease   Multiple skin tears   Generalized weakness   Dehydration   Acute prerenal azotemia   Leukocytosis   Chronic hyponatremia  #1 acute metabolic encephalopathy -For likely secondary to dehydration and concern for possible sepsis to tubes for dislocated hip reduction recently. -Patient with no signs or symptoms of infection. -TSH within normal limits at 1.768, vitamin B12 at 1081. -Patient with clinical improvement with hydration likely close to  baseline.  2.  Dehydration/rhabdomyolysis -Improving with hydration. -IV fluids.  3.  Bilateral lower extremity wounds/skin tears -Noted to have been sustained during hip reduction attempts. -No evidence of infection noted. -Continue current local wound care.  4.  Generalized weakness/recent left hip dislocation/status post closed reduction in OR -Patient has been PT/ OT who are recommending SNF placement. -Awaiting SNF placement.  5.  Hypertension -Patient noted to have been on Coreg, diltiazem, losartan, and Lasix every other day prior to admission. -BP currently well-controlled on current regimen of Coreg, Cardizem. -Continue to hold Lasix, Cozaar.  6.  Acute on chronic anemia -Baseline hemoglobin close to approximately 9. -Patient with no overt bleeding.  Likely dilutional. -Hemoglobin currently stable at 7.9 today. -Follow H&H.  7.  Chronic hyponatremia -Sodium levels chronically 130 - 135. -Currently at baseline at 132.      DVT prophylaxis: SCDs Code Status: DNR Family Communication: Updated patient.  No family at bedside. Disposition: Patient currently medically stable.  Skilled nursing facility when bed available.  Status is: Inpatient Remains inpatient appropriate because: Unsafe dispo patient.  Severity of illness.   Consultants:  Wound care RN: Priscella Mann 12/07/2022  Procedures:  CT head 12/06/2022 Chest x-ray 12/06/2022   Antimicrobials:  Anti-infectives (From admission, onward)    None         Subjective: Patient sitting up in recliner.  Alert and oriented to self place and time.  Denies any chest pain or shortness of breath.  No abdominal pain.  No dysuria.  Overall feels well.  Objective: Vitals:   12/07/22 1528 12/07/22 1847 12/07/22 1958 12/08/22 0500  BP: 128/69 Marland Kitchen)  98/53 (!) 113/58   Pulse: 77 79 78   Resp: 17 17 (!) 24   Temp: 98.1 F (36.7 C) 98 F (36.7 C) 98.4 F (36.9 C)   TempSrc: Oral Oral Oral   SpO2: 100% 97% 95%    Weight:    61.5 kg    Intake/Output Summary (Last 24 hours) at 12/08/2022 1101 Last data filed at 12/08/2022 0900 Gross per 24 hour  Intake 2324.28 ml  Output --  Net 2324.28 ml   Filed Weights   12/07/22 0500 12/08/22 0500  Weight: 61 kg 61.5 kg    Examination:  General exam: Appears calm and comfortable.  NAD. Respiratory system: Clear to auscultation.  No wheezes, no crackles, no rhonchi.  Fair air movement.  Speaking in full sentences.  Respiratory effort normal. Cardiovascular system: S1 & S2 heard, RRR. No JVD, murmurs, rubs, gallops or clicks. No pedal pitting edema. Gastrointestinal system: Abdomen is nondistended, soft and nontender. No organomegaly or masses felt. Normal bowel sounds heard. Central nervous system: Alert and oriented. No focal neurological deficits. Extremities: Right lower extremity bandaged.  Symmetric 5 x 5 power. Skin: Multiple skin tears noted on lower extremity. Psychiatry: Judgement and insight appear normal. Mood & affect appropriate.     Data Reviewed: I have personally reviewed following labs and imaging studies  CBC: Recent Labs  Lab 12/05/22 0300 12/06/22 2127 12/07/22 0522 12/08/22 1029  WBC 10.4 11.3* 10.1 9.2  NEUTROABS 6.9 7.7 6.3 6.2  HGB 8.9* 7.8* 7.6* 7.9*  HCT 27.8* 24.8* 24.3* 25.7*  MCV 86.3 86.7 85.6 89.9  PLT 330 281 277 293    Basic Metabolic Panel: Recent Labs  Lab 12/05/22 0300 12/06/22 2127 12/07/22 0522  NA 134* 133* 133*  K 3.9 4.0 4.0  CL 104 102 103  CO2 21* 22 20*  GLUCOSE 105* 106* 91  BUN 47* 35* 37*  CREATININE 0.97 1.21* 1.13*  CALCIUM 8.5* 8.4* 8.4*  MG  --   --  2.2    GFR: Estimated Creatinine Clearance: 29.2 mL/min (A) (by C-G formula based on SCr of 1.13 mg/dL (H)).  Liver Function Tests: Recent Labs  Lab 12/07/22 0522  AST 30  ALT 19  ALKPHOS 59  BILITOT 0.5  PROT 6.0*  ALBUMIN 2.6*    CBG: Recent Labs  Lab 12/08/22 0720  GLUCAP 103*     No results found for this  or any previous visit (from the past 240 hour(s)).       Radiology Studies: CT Head Wo Contrast  Result Date: 12/07/2022 CLINICAL DATA:  Mental status change EXAM: CT HEAD WITHOUT CONTRAST TECHNIQUE: Contiguous axial images were obtained from the base of the skull through the vertex without intravenous contrast. RADIATION DOSE REDUCTION: This exam was performed according to the departmental dose-optimization program which includes automated exposure control, adjustment of the mA and/or kV according to patient size and/or use of iterative reconstruction technique. COMPARISON:  CT brain 04/07/2022, MRI 03/06/2022 FINDINGS: Brain: No acute territorial infarction, hemorrhage or intracranial mass. Chronic small vessel ischemic changes of the white matter and atrophy. Similar ventricular enlargement somewhat out of proportion to atrophy. Vascular: No hyperdense vessels.  Carotid vascular calcification Skull: Normal. Negative for fracture or focal lesion. Sinuses/Orbits: No acute finding. Other: None IMPRESSION: 1. No CT evidence for acute intracranial abnormality. 2. Chronic small vessel ischemic changes of the white matter and atrophy. 3. Similar ventricular enlargement somewhat out of proportion to atrophy. Findings could be due to normal pressure hydrocephalus superimposed  on atrophy and chronic microvascular disease, this finding is unchanged Electronically Signed   By: Jasmine Pang M.D.   On: 12/07/2022 00:20   DG CHEST PORT 1 VIEW  Result Date: 12/07/2022 CLINICAL DATA:  Altered mental status EXAM: PORTABLE CHEST 1 VIEW COMPARISON:  08/23/2022, CT 06/13/2022 FINDINGS: Underlying chronic interstitial lung disease with fibrosis and ground-Wall density. No acute superimposed airspace disease. Stable cardiomediastinal silhouette. No pneumothorax IMPRESSION: Chronic interstitial lung disease without definitive acute superimposed airspace disease. Electronically Signed   By: Jasmine Pang M.D.   On:  12/07/2022 00:03        Scheduled Meds:  carvedilol  12.5 mg Oral BID WC   diltiazem  240 mg Oral Daily   ferrous sulfate  650 mg Oral Q breakfast   hydrocerin   Topical BID   influenza vaccine adjuvanted  0.5 mL Intramuscular Tomorrow-1000   Continuous Infusions:  sodium chloride 75 mL/hr at 12/07/22 1536     LOS: 1 day    Time spent: 35 minutes    Ramiro Harvest, MD [facility]   To contact the attending provider between 7A-7P or the covering provider during after hours 7P-7A, please log into the web site [email or web address] and access using universal [place] password for that web site. If you do not have the password, please call the hospital operator.  12/08/2022, 11:01 AM

## 2022-12-08 NOTE — Progress Notes (Signed)
Assumed care of patient,, agree with off going RN assessment

## 2022-12-08 NOTE — Plan of Care (Signed)
Problem: Education: Goal: Knowledge of General Education information will improve Description: Including pain rating scale, medication(s)/side effects and non-pharmacologic comfort measures Outcome: Progressing   Problem: Health Behavior/Discharge Planning: Goal: Ability to manage health-related needs will improve Outcome: Progressing   Problem: Activity: Goal: Risk for activity intolerance will decrease Outcome: Progressing   Problem: Nutrition: Goal: Adequate nutrition will be maintained Outcome: Progressing   Problem: Coping: Goal: Level of anxiety will decrease Outcome: Progressing   Problem: Elimination: Goal: Will not experience complications related to bowel motility Outcome: Progressing Goal: Will not experience complications related to urinary retention Outcome: Progressing   Problem: Pain Managment: Goal: General experience of comfort will improve Outcome: Progressing

## 2022-12-08 NOTE — Plan of Care (Signed)
  Problem: Safety: Goal: Ability to remain free from injury will improve Outcome: Progressing   

## 2022-12-08 NOTE — TOC Progression Note (Addendum)
Transition of Care [facility]) - Progression Note    Patient Details  Name: [name] MRN: [id number] Date of Birth: July 05, 1929  [facility]) CM/SW Contact  Jarek Longton, Olegario Messier, RN Phone Number: 12/08/2022, 11:15 AM  Clinical Narrative:   corrected tel# for [facility] tel#[phone]-rep Lyndsay Camden-no beds available.Will contact [facility] rep Whitney for f/u if beds available. -11:51a-left vm w/rep Whitney [facility]-has bed available tomorrow-cost of $43/dy informed [name](son) also provided him with [facility] rep Whitney tel# to ask more specific questions.MD updated.    Expected Discharge Plan: Skilled Nursing Facility Barriers to Discharge: Continued Medical Work up  Expected Discharge Plan and Services In-house Referral: Clinical Social Work   Post Acute Care Choice: Home Health, Skilled Nursing Facility Living arrangements for the past 2 months: Independent Living Facility                                       Social Determinants of Health (SDOH) Interventions SDOH Screenings   Food Insecurity: No Food Insecurity (12/07/2022)  Housing: Patient Declined (12/07/2022)  Transportation Needs: No Transportation Needs (12/07/2022)  Utilities: Not At Risk (12/07/2022)  Alcohol Screen: Low Risk  (01/19/2021)  Depression (PHQ2-9): Low Risk  (06/10/2021)  Tobacco Use: Medium Risk (12/07/2022)    Readmission Risk Interventions     No data to display

## 2022-12-08 NOTE — Plan of Care (Signed)
  Problem: Activity: Goal: Risk for activity intolerance will decrease Outcome: Progressing   

## 2022-12-09 DIAGNOSIS — I10 Essential (primary) hypertension: Secondary | ICD-10-CM | POA: Diagnosis not present

## 2022-12-09 DIAGNOSIS — T148XXA Other injury of unspecified body region, initial encounter: Secondary | ICD-10-CM | POA: Diagnosis not present

## 2022-12-09 DIAGNOSIS — E860 Dehydration: Secondary | ICD-10-CM | POA: Diagnosis not present

## 2022-12-09 DIAGNOSIS — G9340 Encephalopathy, unspecified: Secondary | ICD-10-CM | POA: Diagnosis not present

## 2022-12-09 DIAGNOSIS — I5032 Chronic diastolic (congestive) heart failure: Secondary | ICD-10-CM

## 2022-12-09 LAB — TYPE AND SCREEN
ABO/RH(D): AB POS
Antibody Screen: NEGATIVE
Unit division: 0
Unit division: 0

## 2022-12-09 LAB — CBC
HCT: 23.1 % — ABNORMAL LOW (ref 36.0–46.0)
Hemoglobin: 7.1 g/dL — ABNORMAL LOW (ref 12.0–15.0)
MCH: 27.8 pg (ref 26.0–34.0)
MCHC: 30.7 g/dL (ref 30.0–36.0)
MCV: 90.6 fL (ref 80.0–100.0)
Platelets: 274 10*3/uL (ref 150–400)
RBC: 2.55 MIL/uL — ABNORMAL LOW (ref 3.87–5.11)
RDW: 15.7 % — ABNORMAL HIGH (ref 11.5–15.5)
WBC: 8.2 10*3/uL (ref 4.0–10.5)
nRBC: 0 % (ref 0.0–0.2)

## 2022-12-09 LAB — BASIC METABOLIC PANEL
Anion gap: 7 (ref 5–15)
BUN: 36 mg/dL — ABNORMAL HIGH (ref 8–23)
CO2: 19 mmol/L — ABNORMAL LOW (ref 22–32)
Calcium: 8 mg/dL — ABNORMAL LOW (ref 8.9–10.3)
Chloride: 105 mmol/L (ref 98–111)
Creatinine, Ser: 0.96 mg/dL (ref 0.44–1.00)
GFR, Estimated: 56 mL/min — ABNORMAL LOW (ref >60)
Glucose, Bld: 95 mg/dL (ref 70–99)
Potassium: 4.1 mmol/L (ref 3.5–5.1)
Sodium: 131 mmol/L — ABNORMAL LOW (ref 135–145)

## 2022-12-09 LAB — BPAM RBC
Blood Product Expiration Date: 202410252359
Blood Product Expiration Date: 202410252359
ISSUE DATE / TIME: 202409261250
ISSUE DATE / TIME: 202409261707
Unit Type and Rh: 6200
Unit Type and Rh: 6200

## 2022-12-09 LAB — PREPARE RBC (CROSSMATCH)

## 2022-12-09 MED ORDER — SODIUM CHLORIDE 0.9% IV SOLUTION: Freq: Once | INTRAVENOUS | Status: AC

## 2022-12-09 MED ORDER — DIPHENHYDRAMINE HCL 25 MG PO CAPS
25.0000 mg | ORAL_CAPSULE | Freq: Once | ORAL | Status: AC
  Administered 2022-12-09: 25 mg via ORAL
  Filled 2022-12-09: qty 1

## 2022-12-09 MED ORDER — ACETAMINOPHEN 325 MG PO TABS
650.0000 mg | ORAL_TABLET | Freq: Once | ORAL | Status: DC
  Filled 2022-12-09: qty 2

## 2022-12-09 MED ORDER — FUROSEMIDE 10 MG/ML IJ SOLN
20.0000 mg | Freq: Once | INTRAMUSCULAR | Status: AC
  Administered 2022-12-09: 20 mg via INTRAVENOUS
  Filled 2022-12-09: qty 2

## 2022-12-09 NOTE — Plan of Care (Signed)

## 2022-12-09 NOTE — Progress Notes (Signed)
PT Cancellation Note  Patient Details Name: [name] MRN: [id number] DOB: [birth year]   Cancelled Treatment:     HgB 7.1 pt currently receiving blood transfusion.  PT staff to attempt to see another day as schedule permits,     Felecia Shelling  PTA Acute  Rehabilitation Services Office M-F          [phone]

## 2022-12-09 NOTE — Plan of Care (Signed)
  Problem: Education: Goal: Knowledge of General Education information will improve Description: Including pain rating scale, medication(s)/side effects and non-pharmacologic comfort measures Outcome: Progressing   Problem: Health Behavior/Discharge Planning: Goal: Ability to manage health-related needs will improve Outcome: Progressing   Problem: Clinical Measurements: Goal: Will remain free from infection Outcome: Progressing   Problem: Activity: Goal: Risk for activity intolerance will decrease Outcome: Progressing   Problem: Nutrition: Goal: Adequate nutrition will be maintained Outcome: Progressing   Problem: Coping: Goal: Level of anxiety will decrease Outcome: Progressing   Problem: Elimination: Goal: Will not experience complications related to urinary retention Outcome: Progressing

## 2022-12-09 NOTE — Progress Notes (Addendum)
PROGRESS NOTE    [name]  ZOX:[id number] DOB: Jul 20, 1929 DOA: 12/06/2022 PCP: Emilio Aspen, MD   Chief Complaint  Patient presents with   Leg Pain    Brief Narrative:  [name] is a 89 y.o. female with PMH significant for HTN, PSVT, CKD, chronic anemia, chronic hyponatremia, aortic insufficiency, thoracic aneurysm, GERD, arthritis, depression, h/o multiple hip surgeries and dislocations in the past.  He lives at [facility] assisted living facility. 9/22, patient was brought to the ED for left hip dislocation that he suffered while bending over. Reduction was attempted but unsuccessful in the ED.  She was taken to the OR and underwent closed reduction by Dr. Lequita Halt.  She was subsequent discharged back to ALF the same day. In the subsequent 24 hours, family noted patient was more confused, weak there is also noted multiple skin tears in her lower extremities suspected to have sustained during the reduction attempts 9/23, patient was brought to the ED again.   In the ED, patient was afebrile, blood pressure in 150s Labs showed WBC count of 11.3, hemoglobin 7.8, BN/creatinine 35/1.1, sodium 133  Urinalysis showed hazy yellow urine with small leukocytes CT head did not show any acute intracranial normality, showed chronic small vessel ischemic changes Admitted to [facility]   Assessment & Plan:   Principal Problem:   Acute encephalopathy Active Problems:   Essential hypertension   Anemia of chronic disease   Multiple skin tears   Generalized weakness   Dehydration   Acute prerenal azotemia   Leukocytosis   Chronic hyponatremia   Chronic diastolic CHF (congestive heart failure) (HCC)  #1 acute metabolic encephalopathy -For likely secondary to dehydration and concern for possible use of pain medications and sedatives for dislocated hip reduction recently. -Patient with no signs or symptoms of infection. -TSH within normal limits at 1.768, vitamin B12 at  1081. -Clinical improvement with hydration and likely close to baseline.   2.  Dehydration/rhabdomyolysis -Improved with hydration. -Saline lock IV fluids.   3.  Bilateral lower extremity wounds/skin tears -Noted to have been sustained during hip reduction attempts. -No evidence of infection noted. -Continue current local wound care.  4.  Generalized weakness/recent left hip dislocation/status post closed reduction in OR -Patient has been PT/ OT who are recommending SNF placement. -Hopeful to be discharged to SNF tomorrow 12/10/2022.   5.  Hypertension -Patient noted to have been on Coreg, diltiazem, losartan, and Lasix every other day prior to admission. -BP currently well-controlled on current regimen of Coreg, Cardizem. -Continue to hold Lasix, Cozaar. -Will likely resume home regimen Lasix on discharge.  6.  Acute on chronic anemia -Chronic anemia felt secondary to CKD plus iron deficiency plus anemia of chronic inflammation from possible RA. -Anemia being followed in outpatient setting by hematology. -Patient with no overt bleeding.  Hemoglobin has trickle down currently at 7.1 from 8.9 on admission. -Patient with recent orthopedic maneuvers/closed reduction in the OR, patient noted to have multiple ecchymotic areas with partial thickness skin loss due to trauma and some full-thickness skin loss on the left lower extremity. -Transfused 2 units PRBCs. -Follow H&H. -Transfusion threshold hemoglobin < 8.  -Will need outpatient follow-up with hematology postdischarge.  7.  Chronic hyponatremia -Sodium levels chronically 130 - 135. -Currently at baseline at 131.  8.  Chronic diastolic CHF -Currently euvolemic on exam, presentation was more on the dry side and hydrated gently with IV fluids. -Discontinue IV fluids. -Lasix held on presentation and likely will resume on  discharge. -Patient to be transfused 2 units PRBCs will give Lasix in between units and after the second  unit. -Monitor closely for signs of volume overload. -Likely resume home regimen Lasix on discharge.      DVT prophylaxis: SCDs Code Status: DNR Family Communication: Updated patient.  Updated son at bedside.   Disposition: To SNF hopefully tomorrow posttransfusion of PRBCs.   Status is: Inpatient Remains inpatient appropriate because: Unsafe dispo patient.  Severity of illness.   Consultants:  Wound care RN: Priscella Mann 12/07/2022  Procedures:  CT head 12/06/2022 Chest x-ray 12/06/2022   Antimicrobials:  Anti-infectives (From admission, onward)    None         Subjective: Sitting up in bed eating oatmeal.  Denies any chest pain or shortness of breath.  No abdominal pain.  Denies any overt bleeding.  He is in agreement for transfusion of PRBCs due to low hemoglobin.   Objective: Vitals:   12/08/22 0500 12/08/22 1258 12/09/22 0500 12/09/22 0906  BP:  (!) 116/59  (!) 143/73  Pulse:  (!) 56  84  Resp:    18  Temp:  (!) 97.5 F (36.4 C)    TempSrc:  Oral    SpO2:  100%  99%  Weight: 61.5 kg  65.5 kg     Intake/Output Summary (Last 24 hours) at 12/09/2022 1038 Last data filed at 12/08/2022 1700 Gross per 24 hour  Intake 576.33 ml  Output --  Net 576.33 ml   Filed Weights   12/07/22 0500 12/08/22 0500 12/09/22 0500  Weight: 61 kg 61.5 kg 65.5 kg    Examination:  General exam: NAD Respiratory system: CTAB.  No wheezes, no crackles, no rhonchi.  Fair air movement.  Speaking in full sentences.  Normal respiratory effort.  Cardiovascular system: RRR no murmurs rubs or gallops.  No JVD.  No pitting lower extremity edema.  Gastrointestinal system: Abdomen is soft, nontender, nondistended, positive bowel sounds.  No rebound.  No guarding.  Central nervous system: Alert and oriented. No focal neurological deficits. Extremities: Left lower extremity bandaged.  Symmetric 5 x 5 power. Skin: Multiple skin tears noted on lower extremity. Psychiatry: Judgement and  insight appear normal. Mood & affect appropriate.         Data Reviewed: I have personally reviewed following labs and imaging studies  CBC: Recent Labs  Lab 12/05/22 0300 12/06/22 2127 12/07/22 0522 12/08/22 1029 12/09/22 0443  WBC 10.4 11.3* 10.1 9.2 8.2  NEUTROABS 6.9 7.7 6.3 6.2  --   HGB 8.9* 7.8* 7.6* 7.9* 7.1*  HCT 27.8* 24.8* 24.3* 25.7* 23.1*  MCV 86.3 86.7 85.6 89.9 90.6  PLT 330 281 277 293 274    Basic Metabolic Panel: Recent Labs  Lab 12/05/22 0300 12/06/22 2127 12/07/22 0522 12/08/22 1029 12/09/22 0443  NA 134* 133* 133* 132* 131*  K 3.9 4.0 4.0 4.1 4.1  CL 104 102 103 102 105  CO2 21* 22 20* 20* 19*  GLUCOSE 105* 106* 91 170* 95  BUN 47* 35* 37* 30* 36*  CREATININE 0.97 1.21* 1.13* 0.96 0.96  CALCIUM 8.5* 8.4* 8.4* 8.4* 8.0*  MG  --   --  2.2  --   --     GFR: Estimated Creatinine Clearance: 34.4 mL/min (by C-G formula based on SCr of 0.96 mg/dL).  Liver Function Tests: Recent Labs  Lab 12/07/22 0522  AST 30  ALT 19  ALKPHOS 59  BILITOT 0.5  PROT 6.0*  ALBUMIN 2.6*  CBG: Recent Labs  Lab 12/08/22 0720  GLUCAP 103*     No results found for this or any previous visit (from the past 240 hour(s)).       Radiology Studies: No results found.      Scheduled Meds:  sodium chloride   Intravenous Once   acetaminophen  650 mg Oral Once   carvedilol  12.5 mg Oral BID WC   diltiazem  240 mg Oral Daily   ferrous sulfate  650 mg Oral Q breakfast   furosemide  20 mg Intravenous Once   furosemide  20 mg Intravenous Once   hydrocerin   Topical BID   influenza vaccine adjuvanted  0.5 mL Intramuscular Tomorrow-1000   Continuous Infusions:     LOS: 2 days    Time spent: 40 minutes    Ramiro Harvest, MD [facility]   To contact the attending provider between 7A-7P or the covering provider during after hours 7P-7A, please log into the web site [email or web address] and access using universal [place] password for  that web site. If you do not have the password, please call the hospital operator.  12/09/2022, 10:38 AM

## 2022-12-09 NOTE — TOC Transition Note (Signed)
Transition of Care [facility]) - CM/SW Discharge Note   Patient Details  Name: [name] MRN: [id number] Date of Birth: [birth year]  Transition of Care [facility]) CM/SW Contact:  Lanier Clam, RN Phone Number: 12/09/2022, 10:28 AM   Clinical Narrative: [name] accepted has bed available d/c summary by 2p if d/c today.DNR.MD updated.      Final next level of care: Skilled Nursing Facility Barriers to Discharge: Continued Medical Work up   Patient Goals and CMS Choice CMS Medicare.gov Compare Post Acute Care list provided to:: Patient Represenative (must comment) Choice offered to / list presented to : Adult Children  Discharge Placement                         Discharge Plan and Services Additional resources added to the After Visit Summary for   In-house Referral: Clinical Social Work   Post Acute Care Choice: Home Health, Skilled Nursing Facility                               Social Determinants of Health (SDOH) Interventions SDOH Screenings   Food Insecurity: No Food Insecurity (12/07/2022)  Housing: Patient Declined (12/07/2022)  Transportation Needs: No Transportation Needs (12/07/2022)  Utilities: Not At Risk (12/07/2022)  Alcohol Screen: Low Risk  (01/19/2021)  Depression (PHQ2-9): Low Risk  (06/10/2021)  Tobacco Use: Medium Risk (12/07/2022)     Readmission Risk Interventions     No data to display

## 2022-12-10 DIAGNOSIS — F332 Major depressive disorder, recurrent severe without psychotic features: Secondary | ICD-10-CM | POA: Diagnosis not present

## 2022-12-10 DIAGNOSIS — E559 Vitamin D deficiency, unspecified: Secondary | ICD-10-CM | POA: Diagnosis not present

## 2022-12-10 DIAGNOSIS — E860 Dehydration: Secondary | ICD-10-CM | POA: Diagnosis not present

## 2022-12-10 DIAGNOSIS — M5030 Other cervical disc degeneration, unspecified cervical region: Secondary | ICD-10-CM | POA: Diagnosis not present

## 2022-12-10 DIAGNOSIS — R531 Weakness: Secondary | ICD-10-CM | POA: Diagnosis not present

## 2022-12-10 DIAGNOSIS — T84020S Dislocation of internal right hip prosthesis, sequela: Secondary | ICD-10-CM | POA: Diagnosis not present

## 2022-12-10 DIAGNOSIS — D638 Anemia in other chronic diseases classified elsewhere: Secondary | ICD-10-CM | POA: Diagnosis not present

## 2022-12-10 DIAGNOSIS — D509 Iron deficiency anemia, unspecified: Secondary | ICD-10-CM | POA: Diagnosis not present

## 2022-12-10 DIAGNOSIS — G9340 Encephalopathy, unspecified: Secondary | ICD-10-CM | POA: Diagnosis not present

## 2022-12-10 DIAGNOSIS — D72829 Elevated white blood cell count, unspecified: Secondary | ICD-10-CM | POA: Diagnosis not present

## 2022-12-10 DIAGNOSIS — N182 Chronic kidney disease, stage 2 (mild): Secondary | ICD-10-CM | POA: Diagnosis not present

## 2022-12-10 DIAGNOSIS — M5416 Radiculopathy, lumbar region: Secondary | ICD-10-CM | POA: Diagnosis not present

## 2022-12-10 DIAGNOSIS — F4325 Adjustment disorder with mixed disturbance of emotions and conduct: Secondary | ICD-10-CM | POA: Diagnosis not present

## 2022-12-10 DIAGNOSIS — R799 Abnormal finding of blood chemistry, unspecified: Secondary | ICD-10-CM | POA: Diagnosis not present

## 2022-12-10 DIAGNOSIS — Z7401 Bed confinement status: Secondary | ICD-10-CM | POA: Diagnosis not present

## 2022-12-10 DIAGNOSIS — M064 Inflammatory polyarthropathy: Secondary | ICD-10-CM | POA: Diagnosis not present

## 2022-12-10 DIAGNOSIS — T148XXA Other injury of unspecified body region, initial encounter: Secondary | ICD-10-CM | POA: Diagnosis not present

## 2022-12-10 DIAGNOSIS — Z23 Encounter for immunization: Secondary | ICD-10-CM | POA: Diagnosis not present

## 2022-12-10 DIAGNOSIS — J849 Interstitial pulmonary disease, unspecified: Secondary | ICD-10-CM | POA: Diagnosis not present

## 2022-12-10 DIAGNOSIS — M1990 Unspecified osteoarthritis, unspecified site: Secondary | ICD-10-CM | POA: Diagnosis not present

## 2022-12-10 DIAGNOSIS — M6282 Rhabdomyolysis: Secondary | ICD-10-CM | POA: Diagnosis not present

## 2022-12-10 DIAGNOSIS — I130 Hypertensive heart and chronic kidney disease with heart failure and stage 1 through stage 4 chronic kidney disease, or unspecified chronic kidney disease: Secondary | ICD-10-CM | POA: Diagnosis not present

## 2022-12-10 DIAGNOSIS — S73005D Unspecified dislocation of left hip, subsequent encounter: Secondary | ICD-10-CM | POA: Diagnosis not present

## 2022-12-10 DIAGNOSIS — I10 Essential (primary) hypertension: Secondary | ICD-10-CM | POA: Diagnosis not present

## 2022-12-10 DIAGNOSIS — M47812 Spondylosis without myelopathy or radiculopathy, cervical region: Secondary | ICD-10-CM | POA: Diagnosis not present

## 2022-12-10 DIAGNOSIS — I5032 Chronic diastolic (congestive) heart failure: Secondary | ICD-10-CM | POA: Diagnosis not present

## 2022-12-10 DIAGNOSIS — R5381 Other malaise: Secondary | ICD-10-CM | POA: Diagnosis not present

## 2022-12-10 DIAGNOSIS — S81812A Laceration without foreign body, left lower leg, initial encounter: Secondary | ICD-10-CM | POA: Diagnosis not present

## 2022-12-10 DIAGNOSIS — M6281 Muscle weakness (generalized): Secondary | ICD-10-CM | POA: Diagnosis not present

## 2022-12-10 DIAGNOSIS — E871 Hypo-osmolality and hyponatremia: Secondary | ICD-10-CM | POA: Diagnosis not present

## 2022-12-10 DIAGNOSIS — Z8669 Personal history of other diseases of the nervous system and sense organs: Secondary | ICD-10-CM | POA: Diagnosis not present

## 2022-12-10 LAB — CBC
HCT: 30.8 % — ABNORMAL LOW (ref 36.0–46.0)
Hemoglobin: 9.9 g/dL — ABNORMAL LOW (ref 12.0–15.0)
MCH: 27.3 pg (ref 26.0–34.0)
MCHC: 32.1 g/dL (ref 30.0–36.0)
MCV: 85.1 fL (ref 80.0–100.0)
Platelets: 305 10*3/uL (ref 150–400)
RBC: 3.62 MIL/uL — ABNORMAL LOW (ref 3.87–5.11)
RDW: 15.7 % — ABNORMAL HIGH (ref 11.5–15.5)
WBC: 8 10*3/uL (ref 4.0–10.5)
nRBC: 0 % (ref 0.0–0.2)

## 2022-12-10 LAB — HEMOGLOBIN AND HEMATOCRIT, BLOOD
HCT: 28.6 % — ABNORMAL LOW (ref 36.0–46.0)
Hemoglobin: 9.4 g/dL — ABNORMAL LOW (ref 12.0–15.0)

## 2022-12-10 LAB — BASIC METABOLIC PANEL
Anion gap: 7 (ref 5–15)
BUN: 36 mg/dL — ABNORMAL HIGH (ref 8–23)
CO2: 22 mmol/L (ref 22–32)
Calcium: 8.3 mg/dL — ABNORMAL LOW (ref 8.9–10.3)
Chloride: 102 mmol/L (ref 98–111)
Creatinine, Ser: 0.99 mg/dL (ref 0.44–1.00)
GFR, Estimated: 54 mL/min — ABNORMAL LOW (ref >60)
Glucose, Bld: 98 mg/dL (ref 70–99)
Potassium: 3.9 mmol/L (ref 3.5–5.1)
Sodium: 131 mmol/L — ABNORMAL LOW (ref 135–145)

## 2022-12-10 MED ORDER — HYDROCERIN EX CREA: 1.0000 | TOPICAL_CREAM | Freq: Two times a day (BID) | CUTANEOUS | 0 refills | Status: DC

## 2022-12-10 MED ORDER — MELATONIN 3 MG PO TABS: 3.0000 mg | ORAL_TABLET | Freq: Every evening | ORAL | Status: AC | PRN

## 2022-12-10 NOTE — Discharge Summary (Signed)
Physician Discharge Summary  [name] ZOX:[id number] DOB: 05-Jul-1929 DOA: 12/06/2022  PCP: Emilio Aspen, MD  Admit date: 12/06/2022 Discharge date: 12/10/2022  Time spent: 60 minutes  Recommendations for Outpatient Follow-up:  Follow-up with MD at skilled nursing facility.  Patient will need a basic metabolic profile, CBC done in 1 week to follow-up on electrolytes, renal function and counts. Follow-up with Dr. Lequita Halt, orthopedics in 10 days or as previously scheduled.   Discharge Diagnoses:  Principal Problem:   Acute encephalopathy Active Problems:   Essential hypertension   Anemia of chronic disease   Multiple skin tears   Generalized weakness   Dehydration   Acute prerenal azotemia   Leukocytosis   Chronic hyponatremia   Chronic diastolic CHF (congestive heart failure) (HCC)   Discharge Condition: Stable and improved.  Diet recommendation: Heart healthy  Filed Weights   12/08/22 0500 12/09/22 0500 12/10/22 0434  Weight: 61.5 kg 65.5 kg 62.5 kg    History of present illness:  HPI per Dr.Howerter Daryel Gerald is a 89 y.o. female with medical history significant for essential hypertension, anemia of chronic disease associated baseline hemoglobin 8-10, chronic hyponatremia with baseline serum sodium range 1 30-1 34, who is admitted to [facility] on 12/06/2022 with acute encephalopathy after presenting from independent living facility to [facility] ED for evaluation of confusion.    In the setting of the patient's altered mental status, the following history is provided by the patient's son in addition to my discussions with the EDP and via chart review.   2 days ago the patient had presented to [facility], ED with acute left hip dislocation.  Attempts to reduce left hip in the ED were unsuccessful, prompting orthopedic surgery to be consulted and the patient was taken to the operating room for reduction under anesthesia which was subsequently  successful.  She managed with noted to have multiple skin tears on her bilateral lower extremities in the setting of the above attempts at left hip reduction.  This was managed with Tegaderm, and the patient was subsequently discharged back to her independent living facility on 12/05/2022.   Over the last day, the patient's son is noted the patient to be more confused relative to her baseline mental status.  Patient has also been exhibiting evidence of generalized weakness without overt evidence of acute focal weakness.  In the setting of these findings, the patient was brought to [facility] emergency department this evening for further evaluation management thereof.         ED Course:  Vital signs in the ED were notable for the following: Afebrile; heart rates in the 80s to low 100s; systolic blood pressures in the 130s to 150s; respiratory rate 20-22, oxygen saturation 98 to 99% on room air.   Labs were notable for the following: BMP was notable for the following: Sodium 133 compared to 134 on 12/04/2022, BUN 35, creatinine 1.21 compared to 0.97 on 12/04/2022, BUN to creatinine ratio 28.9, glucose 106.  CBC notable for white blood cell count 11,300 relative to 10,400 on 12/04/2022, hemoglobin 7.8 associated normocytic/normochromic properties and relative demonstration prior hemoglobin value of 8.9 on 12/04/2022, platelet count 281.  Urinalysis notable for 6-10 white blood cells and nitrite negative finding.   Per my interpretation, EKG in ED demonstrated the following: No EKG performed today.   Imaging in the ED, per corresponding formal radiology read, was notable for the following: 1 view chest x-ray showed no evidence of acute cardiopulmonary process, including  no evidence of infiltrate, edema, effusion, or pneumothorax.  Noncontrast CT head showed no evidence of acute intracranial process, Cleen evidence intracranial urgent evidence of acute infarct.   While in the ED, the following were  administered: None   Subsequently, the patient was admitted for further evaluation management of presenting acute encephalopathy, with presentation also notable for clinical evidence of dehydration, generalized weakness, as well as the presence of skin tears on the bilateral lower extremities and labs notable for mild leukocytosis.   Hospital Course:  #1 acute metabolic encephalopathy -Likely secondary to dehydration and concern for possible use of pain medications and sedatives for dislocated hip reduction recently. -Patient with no signs or symptoms of infection. -TSH within normal limits at 1.768, vitamin B12 at 1081. -Patient improved clinically and close to baseline by day of discharge.    2.  Dehydration/rhabdomyolysis -Improved and resolved with hydration during the hospitalization.     3.  Bilateral lower extremity wounds/skin tears -Noted to have been sustained during hip reduction attempts. -No evidence of infection noted. -Patient assessed by wound care RN and local wound care was done which patient will be discharged on.     4.  Generalized weakness/recent left hip dislocation/status post closed reduction in OR -Patient has been assessed by PT/ OT who recommended SNF placement. -Patient will be discharged to skilled nursing facility.   5.  Hypertension -Patient noted to have been on Coreg, diltiazem, losartan, and Lasix every other day prior to admission. -BP was well-controlled on home regimen of Coreg and Cardizem during the hospitalization.   -Cozaar and Lasix were held and Lasix will be resumed post discharge.   -Cozaar will be discontinued on discharge until outpatient follow-up with primary cardiologist as previously scheduled.    6.  Acute on chronic anemia -Chronic anemia felt secondary to CKD plus iron deficiency plus anemia of chronic inflammation from possible RA. -Anemia being followed in outpatient setting by hematology. -Patient with no overt bleeding.   Hemoglobin trickle down during the hospitalization down to 7.1 from 8.9 on admission. -Patient with recent orthopedic maneuvers/closed reduction in the OR, patient noted to have multiple ecchymotic areas with partial thickness skin loss due to trauma and some full-thickness skin loss on the left lower extremity. -Status post transfusion of 2 units PRBCs during the hospitalization with hemoglobin coming up to 9.9 by day of discharge.   -Outpatient follow-up with hematology/oncology as previously scheduled.   7.  Chronic hyponatremia -Remained stable during the hospitalization with sodium at 131 by day of discharge.   -Patient noted to be on salt tablets prior to admission which will be resumed on discharge.    8.  Chronic diastolic CHF -Patient remained euvolemic during the hospitalization.   -Lasix held during the hospitalization as on presentation patient looks more on the dry side and was gently hydrated with IV fluids.   -Patient noted to be anemic with hemoglobin dropping as low as 7.1 on 12/09/2022, patient with no overt bleeding and patient subsequently transfused 2 units PRBCs with hemoglobin going up to 9.9 by day of discharge.   -Patient was maintained on home regimen of Cardizem and Coreg during the hospitalization and ARB held.  Lasix will be resumed on discharge.  ARB will be discontinued on discharge until follow-up with cardiology as blood pressure remained well-controlled during the hospitalization on Coreg and Cardizem.   Procedures: CT head 12/06/2022 Chest x-ray 12/06/2022 Transfusion 2 units PRBCs 12/09/2022  Consultations: Wound care RN: Priscella Mann 12/07/2022  Discharge Exam: Vitals:   12/10/22 0436 12/10/22 1226  BP: (!) 156/74 136/71  Pulse: 63 75  Resp: 20 18  Temp: 97.7 F (36.5 C)   SpO2: 99% 97%    General: NAD Cardiovascular: RRR no murmurs rubs or gallops.  Left lower extremity bandaged. Respiratory: Clear to auscultation bilaterally.  No wheezes, no  crackles, no rhonchi.  Fair air movement.  Speaking in full sentences.  Discharge Instructions   Discharge Instructions     Diet - low sodium heart healthy   Complete by: As directed    Discharge wound care:   Complete by: As directed    Wound care  Every other day    Comments: Clean skin tears to L upper medial thigh and lower medial leg with NS, apply vaseline gauze [place] #[phone]) to wound beds every other day, cover with Telfa nonstick dressing, ABD pad and Kerlix roll gauze.  May secure with Ace bandage if desired. IF DRESSINGS STUCK TO WOUND SOAK WITH NS FOR ATRAUMATIC REMOVAL.   Increase activity slowly   Complete by: As directed       Allergies as of 12/10/2022       Reactions   Cephalexin Other (See Comments)   Pt states this caused eye problems and STROKE-LIKE SYMPTOMS!!   Shellfish-derived Products Hives, Rash   Clindamycin/lincomycin Rash   Lincomycin Rash   Clindamycin Hcl Other (See Comments)   "Per pharmacist"   Fosfomycin Other (See Comments)   Disorientation   Levofloxacin Other (See Comments)   Reaction not recalled by the patient   Penicillamine Other (See Comments)   Reaction not recalled by the patient   Sugar-protein-starch Other (See Comments)   Sugary foods cause legs to cramp   Tape Other (See Comments)   Please use paper tape or Coban Wrap; patient's skin tears VERY easily!!   Ciprofloxacin Rash   Sulfa Antibiotics Hives, Rash   Sulfites Hives, Swelling, Rash        Medication List     STOP taking these medications    losartan 25 MG tablet Commonly known as: COZAAR       TAKE these medications    allopurinol 100 MG tablet Commonly known as: ZYLOPRIM Take 50 mg by mouth daily.   amoxicillin 500 MG capsule Commonly known as: AMOXIL Take 2,000 mg by mouth See admin instructions. Take 2,000 mg by mouth one hour prior to dental procedures   carvedilol 12.5 MG tablet Commonly known as: COREG Take 12.5 mg by mouth 2 (two) times daily  with a meal. What changed: Another medication with the same name was removed. Continue taking this medication, and follow the directions you see here.   diltiazem 240 MG 24 hr capsule Commonly known as: CARDIZEM CD Take 1 capsule (240 mg total) by mouth daily.   FeroSul 325 (65 Fe) MG tablet Generic drug: ferrous sulfate Take 650 mg by mouth daily with breakfast.   furosemide 20 MG tablet Commonly known as: Lasix Take 1 tablet (20 mg total) by mouth every other day.   hydrocerin Crea Apply 1 Application topically 2 (two) times daily. Apply to intact skin of bilateral legs and feet twice daily. DO NOT PLACE IN BETWEEN TOES.   loratadine 10 MG tablet Commonly known as: CLARITIN Take 10 mg by mouth 2 (two) times daily.   melatonin 3 MG Tabs tablet Take 1 tablet (3 mg total) by mouth at bedtime as needed (insomnia).   Mitigare 0.6 MG Caps Generic drug: Colchicine Take  0.6 mg by mouth every other day.   sodium chloride 1 g tablet Take 1 g by mouth daily.   SYSTANE ICAPS AREDS2 PO Take 1 capsule by mouth in the morning and at bedtime.   triamcinolone cream 0.1 % Commonly known as: KENALOG Apply topically 3 (three) times daily. To areas of psoariatic rash. Do not apply to face.   TYLENOL 500 MG tablet Generic drug: acetaminophen Take 500-1,000 mg by mouth every 6 (six) hours as needed for mild pain or headache.   Vitamin B Complex Tabs Take 1 tablet by mouth daily with breakfast.   Vitamin D3 25 MCG (1000 UT) Caps Take 1,000 Units by mouth daily.               Discharge Care Instructions  (From admission, onward)           Start     Ordered   12/10/22 0000  Discharge wound care:       Comments: Wound care  Every other day    Comments: Clean skin tears to L upper medial thigh and lower medial leg with NS, apply vaseline gauze [place] [phone]) to wound beds every other day, cover with Telfa nonstick dressing, ABD pad and Kerlix roll gauze.  May secure with Ace  bandage if desired. IF DRESSINGS STUCK TO WOUND SOAK WITH NS FOR ATRAUMATIC REMOVAL.   12/10/22 1212           Allergies  Allergen Reactions   Cephalexin Other (See Comments)    Pt states this caused eye problems and STROKE-LIKE SYMPTOMS!!   Shellfish-Derived Products Hives and Rash   Clindamycin/Lincomycin Rash   Lincomycin Rash   Clindamycin Hcl Other (See Comments)    "Per pharmacist"   Fosfomycin Other (See Comments)    Disorientation   Levofloxacin Other (See Comments)    Reaction not recalled by the patient   Penicillamine Other (See Comments)    Reaction not recalled by the patient   Sugar-Protein-Starch Other (See Comments)    Sugary foods cause legs to cramp   Tape Other (See Comments)    Please use paper tape or Coban Wrap; patient's skin tears VERY easily!!   Ciprofloxacin Rash   Sulfa Antibiotics Hives and Rash   Sulfites Hives, Swelling and Rash    Contact information for follow-up providers     MD at SNF Follow up.          Aluisio, Homero Fellers, MD Follow up in 10 day(s).   Specialty: Orthopedic Surgery Why: Follow-up as previously scheduled or in 10 days. Contact information: [address] [place] [address] [phone]              Contact information for after-discharge care     Destination     [facility] .   Service: Skilled Nursing Contact information: [address] [place] [address] [phone]                      The results of significant diagnostics from this hospitalization (including imaging, microbiology, ancillary and laboratory) are listed below for reference.    Significant Diagnostic Studies: CT Head Wo Contrast  Result Date: 12/07/2022 CLINICAL DATA:  Mental status change EXAM: CT HEAD WITHOUT CONTRAST TECHNIQUE: Contiguous axial images were obtained from the base of the skull through the vertex without intravenous contrast. RADIATION DOSE  REDUCTION: This exam was performed according to the departmental dose-optimization program which includes automated exposure control,  adjustment of the mA and/or kV according to patient size and/or use of iterative reconstruction technique. COMPARISON:  CT brain 04/07/2022, MRI 03/06/2022 FINDINGS: Brain: No acute territorial infarction, hemorrhage or intracranial mass. Chronic small vessel ischemic changes of the white matter and atrophy. Similar ventricular enlargement somewhat out of proportion to atrophy. Vascular: No hyperdense vessels.  Carotid vascular calcification Skull: Normal. Negative for fracture or focal lesion. Sinuses/Orbits: No acute finding. Other: None IMPRESSION: 1. No CT evidence for acute intracranial abnormality. 2. Chronic small vessel ischemic changes of the white matter and atrophy. 3. Similar ventricular enlargement somewhat out of proportion to atrophy. Findings could be due to normal pressure hydrocephalus superimposed on atrophy and chronic microvascular disease, this finding is unchanged Electronically Signed   By: Jasmine Pang M.D.   On: 12/07/2022 00:20   DG CHEST PORT 1 VIEW  Result Date: 12/07/2022 CLINICAL DATA:  Altered mental status EXAM: PORTABLE CHEST 1 VIEW COMPARISON:  08/23/2022, CT 06/13/2022 FINDINGS: Underlying chronic interstitial lung disease with fibrosis and ground-glass density. No acute superimposed airspace disease. Stable cardiomediastinal silhouette. No pneumothorax IMPRESSION: Chronic interstitial lung disease without definitive acute superimposed airspace disease. Electronically Signed   By: Jasmine Pang M.D.   On: 12/07/2022 00:03   DG C-Arm 1-60 Min-No Report  Result Date: 12/05/2022 Fluoroscopy was utilized by the requesting physician.  No radiographic interpretation.   DG Femur Min 2 Views Left  Result Date: 12/05/2022 CLINICAL DATA:  89 year old female with history of dislocation of the left prosthetic fifth status post reduction. EXAM: LEFT  FEMUR 2 VIEWS COMPARISON:  Left hip radiograph 12/05/2022. FINDINGS: Postoperative changes of left hip total arthroplasty and left knee total arthroplasty are noted. No periprosthetic fractures are identified. Femoral shaft appears intact. Femoral head is now dislocated inferior, medial and anteriorly relative to the prosthetic acetabular cup. IMPRESSION: 1. Persistent dislocation of the left prosthetic femoral head relative to the prosthetic acetabular cup, as above. 2. No other acute abnormality of the left femur. Electronically Signed   By: Trudie Reed M.D.   On: 12/05/2022 06:01   DG Pelvis 1-2 Views  Result Date: 12/05/2022 CLINICAL DATA:  89 year old female with history of left hip dislocation status post closed reduction. EXAM: PELVIS - 1-2 VIEW COMPARISON:  12/05/2022 at 3:39 a.m. FINDINGS: Prosthetic femoral head remains dislocated, now inferomedially on this single view examination. IMPRESSION: 1. Persistent now inferomedial dislocation of the prosthetic left femoral head relative to the prosthetic acetabulum. Electronically Signed   By: Trudie Reed M.D.   On: 12/05/2022 05:59   DG Hip Unilat W or Wo Pelvis 2-3 Views Left  Result Date: 12/05/2022 CLINICAL DATA:  89 year old female with history of left hip dislocation after sitting in a chair. EXAM: DG HIP (WITH OR WITHOUT PELVIS) 2-3V LEFT COMPARISON:  09/10/2022. FINDINGS: Patient is status post left hip arthroplasty. No periprosthetic fracture identified. The prosthetic femoral head is dislocated superiorly and laterally relative to the acetabulum, with proximal migration of the femur. Bony pelvic ring appears intact, as do the visualized portions of the right proximal femur. IMPRESSION: 1. Status post left hip arthroplasty with dislocation of the prosthetic femoral head relative to the prosthetic acetabular cup, as above. Electronically Signed   By: Trudie Reed M.D.   On: 12/05/2022 05:05    Microbiology: No results found for  this or any previous visit (from the past 240 hour(s)).   Labs: Basic Metabolic Panel: Recent Labs  Lab 12/06/22 2127 12/07/22 0522 12/08/22 1029 12/09/22 0443 12/10/22 0515  NA 133* 133* 132* 131* 131*  K 4.0 4.0 4.1 4.1 3.9  CL 102 103 102 105 102  CO2 22 20* 20* 19* 22  GLUCOSE 106* 91 170* 95 98  BUN 35* 37* 30* 36* 36*  CREATININE 1.21* 1.13* 0.96 0.96 0.99  CALCIUM 8.4* 8.4* 8.4* 8.0* 8.3*  MG  --  2.2  --   --   --    Liver Function Tests: Recent Labs  Lab 12/07/22 0522  AST 30  ALT 19  ALKPHOS 59  BILITOT 0.5  PROT 6.0*  ALBUMIN 2.6*   No results for input(s): "LIPASE", "AMYLASE" in the last 168 hours. Recent Labs  Lab 12/07/22 0522  AMMONIA 12   CBC: Recent Labs  Lab 12/05/22 0300 12/06/22 2127 12/07/22 0522 12/08/22 1029 12/09/22 0443 12/09/22 2206 12/10/22 0515  WBC 10.4 11.3* 10.1 9.2 8.2  --  8.0  NEUTROABS 6.9 7.7 6.3 6.2  --   --   --   HGB 8.9* 7.8* 7.6* 7.9* 7.1* 9.4* 9.9*  HCT 27.8* 24.8* 24.3* 25.7* 23.1* 28.6* 30.8*  MCV 86.3 86.7 85.6 89.9 90.6  --  85.1  PLT 330 281 277 293 274  --  305   Cardiac Enzymes: Recent Labs  Lab 12/07/22 0522 12/08/22 1029  CKTOTAL 327* 185   BNP: BNP (last 3 results) Recent Labs    04/07/22 1640 06/12/22 1556  BNP 413.6* 195.3*    ProBNP (last 3 results) No results for input(s): "PROBNP" in the last 8760 hours.  CBG: Recent Labs  Lab 12/08/22 0720  GLUCAP 103*       Signed:  Ramiro Harvest MD.  [facility] 12/10/2022, 12:27 PM

## 2022-12-10 NOTE — TOC Progression Note (Addendum)
Transition of Care [facility]) - Progression Note    Patient Details  Name: [name] MRN: [id number] Date of Birth: 01-24-1930  [facility] CM/SW Contact  Jovan Schickling, Olegario Messier, RN Phone Number: 12/10/2022, 9:49 AM  Clinical Narrative:  [name] bed available prefer d/c summary as early as possible considering the weather if stable for d/c. MD notified.  -1p PTAR called. Going to rm#[phone], report tel#[phone]. No further CM needs.    Expected Discharge Plan: Skilled Nursing Facility Barriers to Discharge: Continued Medical Work up  Expected Discharge Plan and Services In-house Referral: Clinical Social Work   Post Acute Care Choice: Home Health, Skilled Nursing Facility Living arrangements for the past 2 months: Independent Living Facility                                       Social Determinants of Health (SDOH) Interventions SDOH Screenings   Food Insecurity: No Food Insecurity (12/07/2022)  Housing: Patient Declined (12/07/2022)  Transportation Needs: No Transportation Needs (12/07/2022)  Utilities: Not At Risk (12/07/2022)  Alcohol Screen: Low Risk  (01/19/2021)  Depression (PHQ2-9): Low Risk  (06/10/2021)  Tobacco Use: Medium Risk (12/07/2022)    Readmission Risk Interventions     No data to display

## 2022-12-13 DIAGNOSIS — E871 Hypo-osmolality and hyponatremia: Secondary | ICD-10-CM | POA: Diagnosis not present

## 2022-12-13 DIAGNOSIS — Z8669 Personal history of other diseases of the nervous system and sense organs: Secondary | ICD-10-CM | POA: Diagnosis not present

## 2022-12-13 DIAGNOSIS — S81812A Laceration without foreign body, left lower leg, initial encounter: Secondary | ICD-10-CM | POA: Diagnosis not present

## 2022-12-13 DIAGNOSIS — I5032 Chronic diastolic (congestive) heart failure: Secondary | ICD-10-CM | POA: Diagnosis not present

## 2022-12-13 DIAGNOSIS — S73005D Unspecified dislocation of left hip, subsequent encounter: Secondary | ICD-10-CM | POA: Diagnosis not present

## 2022-12-13 DIAGNOSIS — D509 Iron deficiency anemia, unspecified: Secondary | ICD-10-CM | POA: Diagnosis not present

## 2022-12-13 DIAGNOSIS — M1990 Unspecified osteoarthritis, unspecified site: Secondary | ICD-10-CM | POA: Diagnosis not present

## 2022-12-13 DIAGNOSIS — R5381 Other malaise: Secondary | ICD-10-CM | POA: Diagnosis not present

## 2022-12-13 DIAGNOSIS — R799 Abnormal finding of blood chemistry, unspecified: Secondary | ICD-10-CM | POA: Diagnosis not present

## 2022-12-15 DIAGNOSIS — M25552 Pain in left hip: Secondary | ICD-10-CM | POA: Diagnosis not present

## 2022-12-28 DIAGNOSIS — F4325 Adjustment disorder with mixed disturbance of emotions and conduct: Secondary | ICD-10-CM | POA: Diagnosis not present

## 2022-12-28 DIAGNOSIS — M064 Inflammatory polyarthropathy: Secondary | ICD-10-CM | POA: Diagnosis not present

## 2022-12-28 DIAGNOSIS — G319 Degenerative disease of nervous system, unspecified: Secondary | ICD-10-CM | POA: Diagnosis not present

## 2022-12-28 DIAGNOSIS — G9340 Encephalopathy, unspecified: Secondary | ICD-10-CM | POA: Diagnosis not present

## 2022-12-28 DIAGNOSIS — D638 Anemia in other chronic diseases classified elsewhere: Secondary | ICD-10-CM | POA: Diagnosis not present

## 2022-12-28 DIAGNOSIS — J849 Interstitial pulmonary disease, unspecified: Secondary | ICD-10-CM | POA: Diagnosis not present

## 2022-12-28 DIAGNOSIS — T84021D Dislocation of internal left hip prosthesis, subsequent encounter: Secondary | ICD-10-CM | POA: Diagnosis not present

## 2022-12-28 DIAGNOSIS — I5032 Chronic diastolic (congestive) heart failure: Secondary | ICD-10-CM | POA: Diagnosis not present

## 2022-12-28 DIAGNOSIS — I6789 Other cerebrovascular disease: Secondary | ICD-10-CM | POA: Diagnosis not present

## 2022-12-28 DIAGNOSIS — E871 Hypo-osmolality and hyponatremia: Secondary | ICD-10-CM | POA: Diagnosis not present

## 2022-12-28 DIAGNOSIS — S81812A Laceration without foreign body, left lower leg, initial encounter: Secondary | ICD-10-CM | POA: Diagnosis not present

## 2022-12-28 DIAGNOSIS — I110 Hypertensive heart disease with heart failure: Secondary | ICD-10-CM | POA: Diagnosis not present

## 2022-12-28 DIAGNOSIS — I872 Venous insufficiency (chronic) (peripheral): Secondary | ICD-10-CM | POA: Diagnosis not present

## 2022-12-28 DIAGNOSIS — I351 Nonrheumatic aortic (valve) insufficiency: Secondary | ICD-10-CM | POA: Diagnosis not present

## 2022-12-28 DIAGNOSIS — F332 Major depressive disorder, recurrent severe without psychotic features: Secondary | ICD-10-CM | POA: Diagnosis not present

## 2022-12-29 DIAGNOSIS — I110 Hypertensive heart disease with heart failure: Secondary | ICD-10-CM | POA: Diagnosis not present

## 2022-12-29 DIAGNOSIS — I5032 Chronic diastolic (congestive) heart failure: Secondary | ICD-10-CM | POA: Diagnosis not present

## 2022-12-29 DIAGNOSIS — G9340 Encephalopathy, unspecified: Secondary | ICD-10-CM | POA: Diagnosis not present

## 2022-12-29 DIAGNOSIS — T84021D Dislocation of internal left hip prosthesis, subsequent encounter: Secondary | ICD-10-CM | POA: Diagnosis not present

## 2022-12-29 DIAGNOSIS — G319 Degenerative disease of nervous system, unspecified: Secondary | ICD-10-CM | POA: Diagnosis not present

## 2022-12-29 DIAGNOSIS — S81812A Laceration without foreign body, left lower leg, initial encounter: Secondary | ICD-10-CM | POA: Diagnosis not present

## 2022-12-30 ENCOUNTER — Emergency Department (HOSPITAL_COMMUNITY): Payer: Medicare Other

## 2022-12-30 ENCOUNTER — Emergency Department (HOSPITAL_COMMUNITY)
Admission: EM | Admit: 2022-12-30 | Discharge: 2022-12-30 | Disposition: A | Discharge status: Home / Self Care | Payer: Medicare Other | Attending: Emergency Medicine | Admitting: Emergency Medicine

## 2022-12-30 ENCOUNTER — Encounter (HOSPITAL_COMMUNITY): Payer: Self-pay

## 2022-12-30 ENCOUNTER — Emergency Department (HOSPITAL_BASED_OUTPATIENT_CLINIC_OR_DEPARTMENT_OTHER)
Admit: 2022-12-30 | Discharge: 2022-12-30 | Disposition: A | Discharge status: Home / Self Care | Payer: Medicare Other | Attending: Emergency Medicine | Admitting: Emergency Medicine

## 2022-12-30 ENCOUNTER — Other Ambulatory Visit: Payer: Self-pay

## 2022-12-30 DIAGNOSIS — L308 Other specified dermatitis: Secondary | ICD-10-CM | POA: Diagnosis not present

## 2022-12-30 DIAGNOSIS — G319 Degenerative disease of nervous system, unspecified: Secondary | ICD-10-CM | POA: Diagnosis not present

## 2022-12-30 DIAGNOSIS — I5032 Chronic diastolic (congestive) heart failure: Secondary | ICD-10-CM | POA: Diagnosis not present

## 2022-12-30 DIAGNOSIS — S73005D Unspecified dislocation of left hip, subsequent encounter: Secondary | ICD-10-CM | POA: Diagnosis not present

## 2022-12-30 DIAGNOSIS — L309 Dermatitis, unspecified: Secondary | ICD-10-CM

## 2022-12-30 DIAGNOSIS — I10 Essential (primary) hypertension: Secondary | ICD-10-CM | POA: Diagnosis not present

## 2022-12-30 DIAGNOSIS — M7989 Other specified soft tissue disorders: Secondary | ICD-10-CM

## 2022-12-30 DIAGNOSIS — S81812A Laceration without foreign body, left lower leg, initial encounter: Secondary | ICD-10-CM | POA: Diagnosis not present

## 2022-12-30 DIAGNOSIS — I110 Hypertensive heart disease with heart failure: Secondary | ICD-10-CM | POA: Diagnosis not present

## 2022-12-30 DIAGNOSIS — G9340 Encephalopathy, unspecified: Secondary | ICD-10-CM | POA: Diagnosis not present

## 2022-12-30 DIAGNOSIS — Z96642 Presence of left artificial hip joint: Secondary | ICD-10-CM | POA: Diagnosis not present

## 2022-12-30 DIAGNOSIS — E870 Hyperosmolality and hypernatremia: Secondary | ICD-10-CM | POA: Diagnosis not present

## 2022-12-30 DIAGNOSIS — M79605 Pain in left leg: Secondary | ICD-10-CM | POA: Insufficient documentation

## 2022-12-30 DIAGNOSIS — Z7401 Bed confinement status: Secondary | ICD-10-CM | POA: Diagnosis not present

## 2022-12-30 DIAGNOSIS — T84021D Dislocation of internal left hip prosthesis, subsequent encounter: Secondary | ICD-10-CM | POA: Diagnosis not present

## 2022-12-30 DIAGNOSIS — R58 Hemorrhage, not elsewhere classified: Secondary | ICD-10-CM | POA: Diagnosis not present

## 2022-12-30 DIAGNOSIS — L259 Unspecified contact dermatitis, unspecified cause: Secondary | ICD-10-CM | POA: Insufficient documentation

## 2022-12-30 DIAGNOSIS — M25552 Pain in left hip: Secondary | ICD-10-CM | POA: Diagnosis not present

## 2022-12-30 LAB — CBC WITH DIFFERENTIAL/PLATELET
Abs Immature Granulocytes: 0.03 10*3/uL (ref 0.00–0.07)
Basophils Absolute: 0.1 10*3/uL (ref 0.0–0.1)
Basophils Relative: 1 %
Eosinophils Absolute: 0.6 10*3/uL — ABNORMAL HIGH (ref 0.0–0.5)
Eosinophils Relative: 7 %
HCT: 29.2 % — ABNORMAL LOW (ref 36.0–46.0)
Hemoglobin: 9.2 g/dL — ABNORMAL LOW (ref 12.0–15.0)
Immature Granulocytes: 0 %
Lymphocytes Relative: 15 %
Lymphs Abs: 1.3 10*3/uL (ref 0.7–4.0)
MCH: 27.5 pg (ref 26.0–34.0)
MCHC: 31.5 g/dL (ref 30.0–36.0)
MCV: 87.2 fL (ref 80.0–100.0)
Monocytes Absolute: 1.2 10*3/uL — ABNORMAL HIGH (ref 0.1–1.0)
Monocytes Relative: 14 %
Neutro Abs: 5.4 10*3/uL (ref 1.7–7.7)
Neutrophils Relative %: 63 %
Platelets: 370 10*3/uL (ref 150–400)
RBC: 3.35 MIL/uL — ABNORMAL LOW (ref 3.87–5.11)
RDW: 15.7 % — ABNORMAL HIGH (ref 11.5–15.5)
WBC: 8.6 10*3/uL (ref 4.0–10.5)
nRBC: 0 % (ref 0.0–0.2)

## 2022-12-30 LAB — URINALYSIS, ROUTINE W REFLEX MICROSCOPIC
Bacteria, UA: NONE SEEN
Bilirubin Urine: NEGATIVE
Glucose, UA: NEGATIVE mg/dL
Hgb urine dipstick: NEGATIVE
Ketones, ur: NEGATIVE mg/dL
Nitrite: NEGATIVE
Protein, ur: NEGATIVE mg/dL
Specific Gravity, Urine: 1.012 (ref 1.005–1.030)
pH: 6 (ref 5.0–8.0)

## 2022-12-30 LAB — COMPREHENSIVE METABOLIC PANEL
ALT: 17 U/L (ref 0–44)
AST: 24 U/L (ref 15–41)
Albumin: 3.1 g/dL — ABNORMAL LOW (ref 3.5–5.0)
Alkaline Phosphatase: 76 U/L (ref 38–126)
Anion gap: 12 (ref 5–15)
BUN: 39 mg/dL — ABNORMAL HIGH (ref 8–23)
CO2: 23 mmol/L (ref 22–32)
Calcium: 9 mg/dL (ref 8.9–10.3)
Chloride: 101 mmol/L (ref 98–111)
Creatinine, Ser: 1.06 mg/dL — ABNORMAL HIGH (ref 0.44–1.00)
GFR, Estimated: 50 mL/min — ABNORMAL LOW (ref >60)
Glucose, Bld: 82 mg/dL (ref 70–99)
Potassium: 4.8 mmol/L (ref 3.5–5.1)
Sodium: 136 mmol/L (ref 135–145)
Total Bilirubin: 0.6 mg/dL (ref 0.3–1.2)
Total Protein: 6.9 g/dL (ref 6.5–8.1)

## 2022-12-30 LAB — I-STAT CG4 LACTIC ACID, ED: Lactic Acid, Venous: 0.7 mmol/L (ref 0.5–1.9)

## 2022-12-30 MED ORDER — PREDNISONE 20 MG PO TABS
60.0000 mg | ORAL_TABLET | Freq: Once | ORAL | Status: AC
  Administered 2022-12-30: 60 mg via ORAL
  Filled 2022-12-30: qty 3

## 2022-12-30 MED ORDER — HYDROXYZINE HCL 25 MG PO TABS
50.0000 mg | ORAL_TABLET | Freq: Once | ORAL | Status: AC
  Administered 2022-12-30: 50 mg via ORAL
  Filled 2022-12-30: qty 2

## 2022-12-30 MED ORDER — HYDROXYZINE HCL 25 MG PO TABS: 25.0000 mg | ORAL_TABLET | ORAL | 0 refills | Status: DC | PRN

## 2022-12-30 MED ORDER — PREDNISONE 20 MG PO TABS: ORAL_TABLET | ORAL | 0 refills | Status: DC

## 2022-12-30 MED ORDER — DIPHENHYDRAMINE HCL 50 MG/ML IJ SOLN
25.0000 mg | Freq: Once | INTRAMUSCULAR | Status: AC
  Administered 2022-12-30: 25 mg via INTRAVENOUS
  Filled 2022-12-30: qty 1

## 2022-12-30 NOTE — Discharge Instructions (Addendum)
1.  At this time the wounds that were present on your left leg appear to be healing with the treatment you are getting from wound care wraps.  You do have extensive redness and swelling of the leg and this is going up to the thigh.  You previously had significant skin tear wounds of the upper thigh but these are healed.  You have some swelling that is increased above the Unna boot wrap and I suspect you are getting an inflammatory response with the swelling combined with reabsorbing blood from the prior bruising.  This can cause a lot of itching.  Your worst symptom right now appears to be itching. 2.  You have been given a course of prednisone to see if this helps with the inflammation and dermatitis-like rash that you are experiencing both on your arms as well as your leg.  You were given a 60 mg dose of prednisone in the emergency department.  You then advised that you have experienced confusion with prednisone in the past.  This is a potential side effect.  Observe for any signs of confusion. You can hold off on another dose until you see your doctor for follow-up to determine if the risks and benefits of taking the prednisone are in favor of trying to treat the inflammation and swelling that you are experiencing.  You have been given prescription for Atarax, and antihistamine to try to help with the itching. 3.  Your wounds and redness need to be observed closely for response to these treatments.  At this time, your blood work is in normal range and appears less likely to represent infection such as cellulitis.  Ever, monitor your temperature closely and watch for redness that is going farther up your leg.  An ultrasound was done of your leg and there is no evidence of a blood clot open (DVT) in the leg.  An x-ray was done of your hip and there has been no change.  It appears that stable baseline.  On your examination, you have a good pulse in the foot. 4.  Because the swelling above the wound wraps appears  to be exacerbating itching and redness, try to continue wound care with somewhat less pressure and more elevating to see if this is helpful.  The wounds themselves appear to be healing well.  5.  Follow-up with your dermatologist as soon as possible for recheck.  Follow-up with your primary doctor who is managing your wound care as soon as possible.

## 2022-12-30 NOTE — ED Notes (Signed)
Pts LLE opened wound cleaned with saline and dries. Placed nonadherent with abd pad placed per edp instruction.

## 2022-12-30 NOTE — ED Provider Notes (Signed)
[place] EMERGENCY DEPARTMENT AT [facility] Provider Note   CSN: [id number] Arrival date & time: 12/30/22  1637     History  Chief Complaint  Patient presents with   Leg Pain    [name] is a 89 y.o. female.  HPI Patient had a left hip dislocation 9\22.  At that time significant manipulation was required to try to reduce it.  Ultimately the patient had to be taken to the OR for reduction.  Subsequent to this, the patient developed significant skin tears and ecchymoses.  2 days later patient presented to the emergency department again, this time encephalopathic.  She experienced confusion which was atypical for her.  Patient was admitted with encephalopathy of uncertain etiology.  Ultimately patient returned to normal baseline with suspected mild dehydration and possibly mental status change from pain medications used for sedation 2 days previously.  She was discharged with routine for skin care after significant skin tears and ecchymosis of the lower extremities.  Patient reports that the pain in the left leg has gotten worse over the past week approximately.  She advises that the pain is really mostly in the lower leg and not associated so much with the hip which was dislocated.  She reports the hip is still very uncomfortable but the pain that has either recrudesced or amplified over the past week is the lower leg.  To the mid thigh.  Patient has been getting Unna boot wraps to heal wounds.  She arrives with calamine Unna boot wrap in place.  Erythema is up to the mid thigh.  Patient reports that she really has not been on the examine the leg so she does not know based on its appearance how much is changed over time.  Patient denies she has had fever chills or night sweats.  She reports her appetite has been unchanged.  She reports that the leg bothers her quite a bit but other than that she feels at baseline.  Patient reports that the skin is actually very itchy.   She also has had a lot of itching on her arms.  Looking at the patient's arms, she does have thick lichenified scaling skin as well with excoriations.    Home Medications Prior to Admission medications   Medication Sig Start Date End Date Taking? Authorizing Provider  hydrOXYzine (ATARAX) 25 MG tablet Take 1 tablet (25 mg total) by mouth every 4 (four) hours as needed for itching. 12/30/22  Yes Arby Barrette, MD  predniSONE (DELTASONE) 20 MG tablet 2 tabs po daily x 4 days 12/30/22  Yes Inell Mimbs, Lebron Conners, MD  allopurinol (ZYLOPRIM) 100 MG tablet Take 50 mg by mouth daily.    [provider]  amoxicillin (AMOXIL) 500 MG capsule Take 2,000 mg by mouth See admin instructions. Take 2,000 mg by mouth one hour prior to dental procedures    [provider]  B Complex Vitamins (VITAMIN B COMPLEX) TABS Take 1 tablet by mouth daily with breakfast.    [provider]  carvedilol (COREG) 12.5 MG tablet Take 12.5 mg by mouth 2 (two) times daily with a meal.    [provider]  Cholecalciferol (VITAMIN D3) 25 MCG (1000 UT) CAPS Take 1,000 Units by mouth daily.    [provider]  diltiazem (CARDIZEM CD) 240 MG 24 hr capsule Take 1 capsule (240 mg total) by mouth daily. 10/27/21   Lyn Records, MD  FEROSUL 325 (65 Fe) MG tablet Take 650 mg by mouth  daily with breakfast.    [provider]  furosemide (LASIX) 20 MG tablet Take 1 tablet (20 mg total) by mouth every other day. 04/11/22 04/11/23  Pokhrel, Rebekah Chesterfield, MD  hydrocerin (EUCERIN) CREA Apply 1 Application topically 2 (two) times daily. Apply to intact skin of bilateral legs and feet twice daily. DO NOT PLACE IN BETWEEN TOES. 12/10/22   Rodolph Bong, MD  loratadine (CLARITIN) 10 MG tablet Take 10 mg by mouth 2 (two) times daily.    [provider]  melatonin 3 MG TABS tablet Take 1 tablet (3 mg total) by mouth at bedtime as needed (insomnia). 12/10/22   Rodolph Bong, MD  MITIGARE 0.6 MG  CAPS Take 0.6 mg by mouth every other day.    [provider]  Multiple Vitamins-Minerals (SYSTANE ICAPS AREDS2 PO) Take 1 capsule by mouth in the morning and at bedtime.    [provider]  sodium chloride 1 g tablet Take 1 g by mouth daily. 08/21/19   [provider]  triamcinolone cream (KENALOG) 0.1 % Apply topically 3 (three) times daily. To areas of psoariatic rash. Do not apply to face. 10/25/22   Johney Maine, MD  TYLENOL 500 MG tablet Take 500-1,000 mg by mouth every 6 (six) hours as needed for mild pain or headache.    [provider]      Allergies    Cephalexin, Shellfish-derived products, Clindamycin/lincomycin, Lincomycin, Clindamycin hcl, Fosfomycin, Levofloxacin, Penicillamine, Sugar-protein-starch, Tape, Ciprofloxacin, Sulfa antibiotics, and Sulfites    Review of Systems   Review of Systems  Physical Exam Updated Vital Signs BP (!) 185/95   Pulse 97   Temp 98.5 F (36.9 C) (Oral)   Resp 17   Ht 5\' 4"  (1.626 m)   Wt 59 kg   SpO2 99%   BMI 22.31 kg/m  Physical Exam Constitutional:      Comments: Alert with clear mental status.  Interactive situationally appropriate well-nourished well-developed excellent physical condition for age.  HENT:     Mouth/Throat:     Pharynx: Oropharynx is clear.  Eyes:     Extraocular Movements: Extraocular movements intact.  Cardiovascular:     Rate and Rhythm: Normal rate and regular rhythm.     Comments: Dorsalis pulses 2+ and strong feet are warm and dry. Pulmonary:     Comments: No respiratory distress.  Occasional scattered wheeze. Abdominal:     General: There is no distension.     Palpations: Abdomen is soft.     Tenderness: There is no abdominal tenderness. There is no guarding.  Musculoskeletal:     Comments: Patient has diffuse and intense erythema of the left lower extremity.  She does have a calamine Unna boot that is well placed on the lower leg and foot.  I have removed this.   There is scaling beneath this but the wound that were visualized from prior documentation are healed.  She does thick and blanching erythema with well-demarcated line on the mid thigh on the left.  She also has patches of erythema on the right lower extremity that correspond to previous areas of skin tears.  See attached images.  Neurological:     General: No focal deficit present.     Mental Status: She is oriented to person, place, and time.     Coordination: Coordination normal.  Psychiatric:        Mood and Affect: Mood normal.          ED Results /  Procedures / Treatments   Labs (all labs ordered are listed, but only abnormal results are displayed) Labs Reviewed  COMPREHENSIVE METABOLIC PANEL - Abnormal; Notable for the following components:      Result Value   BUN 39 (*)    Creatinine, Ser 1.06 (*)    Albumin 3.1 (*)    GFR, Estimated 50 (*)    All other components within normal limits  CBC WITH DIFFERENTIAL/PLATELET - Abnormal; Notable for the following components:   RBC 3.35 (*)    Hemoglobin 9.2 (*)    HCT 29.2 (*)    RDW 15.7 (*)    Monocytes Absolute 1.2 (*)    Eosinophils Absolute 0.6 (*)    All other components within normal limits  URINALYSIS, ROUTINE W REFLEX MICROSCOPIC - Abnormal; Notable for the following components:   Leukocytes,Ua TRACE (*)    All other components within normal limits  I-STAT CG4 LACTIC ACID, ED  I-STAT CG4 LACTIC ACID, ED    EKG None  Radiology DG HIP UNILAT WITH PELVIS 2-3 VIEWS LEFT  Result Date: 12/30/2022 CLINICAL DATA:  Persistent pain after hip dislocation last month. EXAM: DG HIP (WITH OR WITHOUT PELVIS) 2-3V LEFT COMPARISON:  12/05/2022 FINDINGS: Previous left hip arthroplasty with non cemented femoral component and 3 screws fixing the acetabular cup. There is no evidence of acute fracture or dislocation. No focal bone lesions are identified. Mild degenerative changes in the right hip. Moderate degenerative changes in the  lower lumbar spine. Pelvis appears intact. SI joints and symphysis pubis are not displaced. IMPRESSION: Left hip arthroplasty appears well seated. No acute bony abnormalities. Electronically Signed   By: Burman Nieves M.D.   On: 12/30/2022 19:15   VAS [place] LOWER EXTREMITY VENOUS (DVT) (7a-7p)  Result Date: 12/30/2022  Lower Venous DVT Study Patient Name:  AYLEN CASTEEN  Date of Exam:   12/30/2022 Medical Rec #: [id number]          Accession #:    [id number] Date of Birth: 1929-11-18         Patient Gender: F Patient Age:   50 years Exam Location:  [facility] Procedure:      VAS [place] LOWER EXTREMITY VENOUS (DVT) Referring Phys: [facility] Krystyne Tewks[place] --------------------------------------------------------------------------------  Indications: Swelling.  Risk Factors: None identified. Limitations: Poor ultrasound/tissue interface and open wound. Comparison Study: No prior studies. Performing Technologist: Chanda Busing RVT  Examination Guidelines: A complete evaluation includes B-mode imaging, spectral Doppler, color Doppler, and power Doppler as needed of all accessible portions of each vessel. Bilateral testing is considered an integral part of a complete examination. Limited examinations for reoccurring indications may be performed as noted. The reflux portion of the exam is performed with the patient in reverse Trendelenburg.  +-----+---------------+---------+-----------+----------+--------------+ RIGHTCompressibilityPhasicitySpontaneityPropertiesThrombus Aging +-----+---------------+---------+-----------+----------+--------------+ CFV  Full           Yes      Yes                                 +-----+---------------+---------+-----------+----------+--------------+   +---------+---------------+---------+-----------+----------+--------------+ LEFT     CompressibilityPhasicitySpontaneityPropertiesThrombus Aging  +---------+---------------+---------+-----------+----------+--------------+ CFV      Full           Yes      Yes                                 +---------+---------------+---------+-----------+----------+--------------+ SFJ  Full                                                        +---------+---------------+---------+-----------+----------+--------------+ FV Prox  Full                                                        +---------+---------------+---------+-----------+----------+--------------+ FV Mid   Full                                                        +---------+---------------+---------+-----------+----------+--------------+ FV DistalFull                                                        +---------+---------------+---------+-----------+----------+--------------+ PFV      Full                                                        +---------+---------------+---------+-----------+----------+--------------+ POP      Full           Yes      Yes                                 +---------+---------------+---------+-----------+----------+--------------+ PTV      Full                                                        +---------+---------------+---------+-----------+----------+--------------+ PERO     Full                                                        +---------+---------------+---------+-----------+----------+--------------+    Summary: RIGHT: - No evidence of common femoral vein obstruction.   LEFT: - There is no evidence of deep vein thrombosis in the lower extremity.  - No cystic structure found in the popliteal fossa.  *See table(s) above for measurements and observations.    Preliminary     Procedures Procedures    Medications Ordered in ED Medications  diphenhydrAMINE (BENADRYL) injection 25 mg (25 mg Intravenous Given 12/30/22 1837)  predniSONE (DELTASONE) tablet 60 mg (60 mg Oral Given 12/30/22 1945)   hydrOXYzine (ATARAX) tablet 50 mg (50 mg Oral Given 12/30/22 1945)    ED Course/ Medical Decision Making/ A&P  Medical Decision Making Amount and/or Complexity of Data Reviewed Labs: ordered. Radiology: ordered.  Risk Prescription drug management.   Patient presents as outlined with report of worsening pain in her left lower extremity.  She has been getting local wound care with Unna boot wraps.  Patient reports he does not see the leg a lot so is hard for her to judge visually how much it is changed in terms of redness or swelling.  She reports is gotten more painful over the past week and that is the lower leg.  The hip is still painful as well but no recurrent episodes that would suggest dislocation.  Patient reports that leg is also particularly itchy.  There is a lot of itching discomfort at the thigh and also patient has thickened eczematous appearing skin on both of her elbows and forearms.  She has been fairly aggressively scratching that as well.  Differential diagnosis includes cellulitis\DVT\inflammatory response with dermatitis in the setting of recent extensive skin tears and superficial wounds.  Obtain basic lab work and DVT study.  Will obtain x-ray of the hip to make sure is still seated and in place.  Lactic acid 0.7.  Comprehensive metabolic panel within normal limits except for BUN of 39 creatinine 1.0 with GFR 50  baseline.  WBC count normal at 8.6.  H&H at baseline stable 9.2/29.2.  DVT study interpreted by radiology negative acute DVT.  On examination patient's wounds are healing.  The previous skin tear regions have closed and resolved.  Patient does have a lot of diffuse erythema and scaling.  There is also edema.  There was more edema above the Unna boot wrap.  At this point with normal labs and healing wounds and skin findings of atopic dermatitis of the upper extremities as well and documented by dermatology in the EMR, I suspect that  patient's erythema and itching is more of an inflammatory response than cellulitis.  She is very uncomfortable with itching.  At this time will opt to treat with a combination of prednisone and antihistamine.  This will need to be continued to be observed but right now I do not think that antibiotics are indicated.  I think continued wound care and elevating with a trial of steroids and antihistamines may give the patient more relief.  DVT is ruled out.  Patient has very good arterial pulses I do not suspect any arterial occlusions.         Final Clinical Impression(s) / ED Diagnoses Final diagnoses:  Dermatitis  Left leg pain    Rx / DC Orders ED Discharge Orders          Ordered    predniSONE (DELTASONE) 20 MG tablet        12/30/22 1957    hydrOXYzine (ATARAX) 25 MG tablet  Every 4 hours PRN        12/30/22 Brooke Pace              Arby Barrette, MD 12/30/22 2002

## 2022-12-30 NOTE — ED Triage Notes (Signed)
Pt brought in by EMS from [place] due to left leg pain. Patient's leg wrapped from ankle up to her knee and another banadge noted above the knee.

## 2022-12-30 NOTE — ED Notes (Signed)
This nurse and [facility], NT unwrapped patient's bandages for ER MD. Redness, swelling and warm to touch noted to the left leg from the ankle up to midthigh area. Patient reports pain has become more severe today. A small amount of blood was noted on the bandages from just under her knee. Unknown if any trauma happened, patient reports that she was unaware she had any bleeding to her leg.

## 2022-12-30 NOTE — ED Notes (Signed)
AVS summary provided by EDP was discussed with pt.

## 2022-12-30 NOTE — ED Notes (Signed)
Pt sts she does not want to take any more prednisone as it makes her disoriented. EDP Pfeiffer informed. No change in AVS. Per EDP pt instructed to follow up with PCP

## 2022-12-30 NOTE — Progress Notes (Signed)
Left lower extremity venous duplex has been completed. Preliminary results can be found in CV Proc through chart review.  Results were given to Dr. Donnald Garre.  12/30/22 6:41 PM Olen Cordial RVT

## 2022-12-30 NOTE — ED Notes (Signed)
New AVS summary provided per EDP for pt

## 2023-01-03 NOTE — Plan of Care (Signed)
CHL Tonsillectomy/Adenoidectomy, Postoperative PEDS care plan entered in error.

## 2023-01-04 DIAGNOSIS — I110 Hypertensive heart disease with heart failure: Secondary | ICD-10-CM | POA: Diagnosis not present

## 2023-01-04 DIAGNOSIS — G9340 Encephalopathy, unspecified: Secondary | ICD-10-CM | POA: Diagnosis not present

## 2023-01-04 DIAGNOSIS — I5032 Chronic diastolic (congestive) heart failure: Secondary | ICD-10-CM | POA: Diagnosis not present

## 2023-01-04 DIAGNOSIS — S81812A Laceration without foreign body, left lower leg, initial encounter: Secondary | ICD-10-CM | POA: Diagnosis not present

## 2023-01-04 DIAGNOSIS — T84021D Dislocation of internal left hip prosthesis, subsequent encounter: Secondary | ICD-10-CM | POA: Diagnosis not present

## 2023-01-04 DIAGNOSIS — G319 Degenerative disease of nervous system, unspecified: Secondary | ICD-10-CM | POA: Diagnosis not present

## 2023-01-05 DIAGNOSIS — G9340 Encephalopathy, unspecified: Secondary | ICD-10-CM | POA: Diagnosis not present

## 2023-01-05 DIAGNOSIS — S81812A Laceration without foreign body, left lower leg, initial encounter: Secondary | ICD-10-CM | POA: Diagnosis not present

## 2023-01-05 DIAGNOSIS — T84021D Dislocation of internal left hip prosthesis, subsequent encounter: Secondary | ICD-10-CM | POA: Diagnosis not present

## 2023-01-05 DIAGNOSIS — D509 Iron deficiency anemia, unspecified: Secondary | ICD-10-CM | POA: Diagnosis not present

## 2023-01-05 DIAGNOSIS — Z8739 Personal history of other diseases of the musculoskeletal system and connective tissue: Secondary | ICD-10-CM | POA: Diagnosis not present

## 2023-01-05 DIAGNOSIS — I5032 Chronic diastolic (congestive) heart failure: Secondary | ICD-10-CM | POA: Diagnosis not present

## 2023-01-05 DIAGNOSIS — R5382 Chronic fatigue, unspecified: Secondary | ICD-10-CM | POA: Diagnosis not present

## 2023-01-05 DIAGNOSIS — I110 Hypertensive heart disease with heart failure: Secondary | ICD-10-CM | POA: Diagnosis not present

## 2023-01-05 DIAGNOSIS — G319 Degenerative disease of nervous system, unspecified: Secondary | ICD-10-CM | POA: Diagnosis not present

## 2023-01-05 DIAGNOSIS — E871 Hypo-osmolality and hyponatremia: Secondary | ICD-10-CM | POA: Diagnosis not present

## 2023-01-05 DIAGNOSIS — J849 Interstitial pulmonary disease, unspecified: Secondary | ICD-10-CM | POA: Diagnosis not present

## 2023-01-05 DIAGNOSIS — I10 Essential (primary) hypertension: Secondary | ICD-10-CM | POA: Diagnosis not present

## 2023-01-05 DIAGNOSIS — R0609 Other forms of dyspnea: Secondary | ICD-10-CM | POA: Diagnosis not present

## 2023-01-06 DIAGNOSIS — S81812A Laceration without foreign body, left lower leg, initial encounter: Secondary | ICD-10-CM | POA: Diagnosis not present

## 2023-01-06 DIAGNOSIS — G319 Degenerative disease of nervous system, unspecified: Secondary | ICD-10-CM | POA: Diagnosis not present

## 2023-01-06 DIAGNOSIS — I5032 Chronic diastolic (congestive) heart failure: Secondary | ICD-10-CM | POA: Diagnosis not present

## 2023-01-06 DIAGNOSIS — I110 Hypertensive heart disease with heart failure: Secondary | ICD-10-CM | POA: Diagnosis not present

## 2023-01-06 DIAGNOSIS — T84021D Dislocation of internal left hip prosthesis, subsequent encounter: Secondary | ICD-10-CM | POA: Diagnosis not present

## 2023-01-06 DIAGNOSIS — G9340 Encephalopathy, unspecified: Secondary | ICD-10-CM | POA: Diagnosis not present

## 2023-01-07 DIAGNOSIS — G9340 Encephalopathy, unspecified: Secondary | ICD-10-CM | POA: Diagnosis not present

## 2023-01-07 DIAGNOSIS — T84021D Dislocation of internal left hip prosthesis, subsequent encounter: Secondary | ICD-10-CM | POA: Diagnosis not present

## 2023-01-07 DIAGNOSIS — I110 Hypertensive heart disease with heart failure: Secondary | ICD-10-CM | POA: Diagnosis not present

## 2023-01-07 DIAGNOSIS — G319 Degenerative disease of nervous system, unspecified: Secondary | ICD-10-CM | POA: Diagnosis not present

## 2023-01-07 DIAGNOSIS — S81812A Laceration without foreign body, left lower leg, initial encounter: Secondary | ICD-10-CM | POA: Diagnosis not present

## 2023-01-07 DIAGNOSIS — I5032 Chronic diastolic (congestive) heart failure: Secondary | ICD-10-CM | POA: Diagnosis not present

## 2023-01-10 DIAGNOSIS — T84021D Dislocation of internal left hip prosthesis, subsequent encounter: Secondary | ICD-10-CM | POA: Diagnosis not present

## 2023-01-10 DIAGNOSIS — S81812A Laceration without foreign body, left lower leg, initial encounter: Secondary | ICD-10-CM | POA: Diagnosis not present

## 2023-01-10 DIAGNOSIS — G319 Degenerative disease of nervous system, unspecified: Secondary | ICD-10-CM | POA: Diagnosis not present

## 2023-01-10 DIAGNOSIS — I5032 Chronic diastolic (congestive) heart failure: Secondary | ICD-10-CM | POA: Diagnosis not present

## 2023-01-10 DIAGNOSIS — G9340 Encephalopathy, unspecified: Secondary | ICD-10-CM | POA: Diagnosis not present

## 2023-01-10 DIAGNOSIS — I110 Hypertensive heart disease with heart failure: Secondary | ICD-10-CM | POA: Diagnosis not present

## 2023-01-12 ENCOUNTER — Ambulatory Visit (INDEPENDENT_AMBULATORY_CARE_PROVIDER_SITE_OTHER): Payer: Medicare Other | Admitting: Podiatry

## 2023-01-12 ENCOUNTER — Encounter: Payer: Self-pay | Admitting: Podiatry

## 2023-01-12 DIAGNOSIS — S81812A Laceration without foreign body, left lower leg, initial encounter: Secondary | ICD-10-CM | POA: Diagnosis not present

## 2023-01-12 DIAGNOSIS — G319 Degenerative disease of nervous system, unspecified: Secondary | ICD-10-CM | POA: Diagnosis not present

## 2023-01-12 DIAGNOSIS — M79675 Pain in left toe(s): Secondary | ICD-10-CM | POA: Diagnosis not present

## 2023-01-12 DIAGNOSIS — B351 Tinea unguium: Secondary | ICD-10-CM | POA: Diagnosis not present

## 2023-01-12 DIAGNOSIS — G9340 Encephalopathy, unspecified: Secondary | ICD-10-CM | POA: Diagnosis not present

## 2023-01-12 DIAGNOSIS — L608 Other nail disorders: Secondary | ICD-10-CM

## 2023-01-12 DIAGNOSIS — T84021D Dislocation of internal left hip prosthesis, subsequent encounter: Secondary | ICD-10-CM | POA: Diagnosis not present

## 2023-01-12 DIAGNOSIS — N1831 Chronic kidney disease, stage 3a: Secondary | ICD-10-CM | POA: Diagnosis not present

## 2023-01-12 DIAGNOSIS — M79674 Pain in right toe(s): Secondary | ICD-10-CM

## 2023-01-12 DIAGNOSIS — I5032 Chronic diastolic (congestive) heart failure: Secondary | ICD-10-CM | POA: Diagnosis not present

## 2023-01-12 DIAGNOSIS — I110 Hypertensive heart disease with heart failure: Secondary | ICD-10-CM | POA: Diagnosis not present

## 2023-01-12 NOTE — Progress Notes (Signed)
This patient returns to the office for evaluation and treatment of long thick painful nails .  This patient is unable to trim his own nails since the patient cannot reach his feet.  Patient says the nails are painful walking and wearing his shoes.   She has painful callus on the bottom of her right big toe. Which has improved.  She returns for preventive foot care services.  General Appearance  Alert, conversant and in no acute stress.  Vascular  Dorsalis pedis and posterior tibial  pulses are  weakly palpable  bilaterally.  Capillary return is within normal limits  bilaterally. Temperature is within normal limits  bilaterally.  Neurologic  Senn-Weinstein monofilament wire test within normal limits  bilaterally. Muscle power within normal limits bilaterally.  Nails Thick disfigured discolored nails with subungual debris  from hallux to fifth toes bilaterally. No evidence of bacterial infection or drainage bilaterally. Pincer fourth toenails  B/l.  Orthopedic  No limitations of motion  feet .  No crepitus or effusions noted.  No bony pathology or digital deformities noted. 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  Skin  normotropic  bilaterally.  No signs of infections or ulcers noted.       Onychomycosis  Pain in toes right foot  Pain in toes left foot    Debridement  of nails  1-5  B/L with a nail nipper.  Nails were then filed using a dremel tool with no incidents.      RTC 9 weeks     Helane Gunther DPM

## 2023-01-13 DIAGNOSIS — S81812A Laceration without foreign body, left lower leg, initial encounter: Secondary | ICD-10-CM | POA: Diagnosis not present

## 2023-01-13 DIAGNOSIS — I110 Hypertensive heart disease with heart failure: Secondary | ICD-10-CM | POA: Diagnosis not present

## 2023-01-13 DIAGNOSIS — I5032 Chronic diastolic (congestive) heart failure: Secondary | ICD-10-CM | POA: Diagnosis not present

## 2023-01-13 DIAGNOSIS — G9340 Encephalopathy, unspecified: Secondary | ICD-10-CM | POA: Diagnosis not present

## 2023-01-13 DIAGNOSIS — T84021D Dislocation of internal left hip prosthesis, subsequent encounter: Secondary | ICD-10-CM | POA: Diagnosis not present

## 2023-01-13 DIAGNOSIS — G319 Degenerative disease of nervous system, unspecified: Secondary | ICD-10-CM | POA: Diagnosis not present

## 2023-01-17 DIAGNOSIS — T84021D Dislocation of internal left hip prosthesis, subsequent encounter: Secondary | ICD-10-CM | POA: Diagnosis not present

## 2023-01-17 DIAGNOSIS — G319 Degenerative disease of nervous system, unspecified: Secondary | ICD-10-CM | POA: Diagnosis not present

## 2023-01-17 DIAGNOSIS — S81812A Laceration without foreign body, left lower leg, initial encounter: Secondary | ICD-10-CM | POA: Diagnosis not present

## 2023-01-17 DIAGNOSIS — G9340 Encephalopathy, unspecified: Secondary | ICD-10-CM | POA: Diagnosis not present

## 2023-01-17 DIAGNOSIS — I110 Hypertensive heart disease with heart failure: Secondary | ICD-10-CM | POA: Diagnosis not present

## 2023-01-17 DIAGNOSIS — I5032 Chronic diastolic (congestive) heart failure: Secondary | ICD-10-CM | POA: Diagnosis not present

## 2023-01-18 DIAGNOSIS — Z5189 Encounter for other specified aftercare: Secondary | ICD-10-CM | POA: Diagnosis not present

## 2023-01-19 DIAGNOSIS — Z85828 Personal history of other malignant neoplasm of skin: Secondary | ICD-10-CM | POA: Diagnosis not present

## 2023-01-19 DIAGNOSIS — I5032 Chronic diastolic (congestive) heart failure: Secondary | ICD-10-CM | POA: Diagnosis not present

## 2023-01-19 DIAGNOSIS — L2089 Other atopic dermatitis: Secondary | ICD-10-CM | POA: Diagnosis not present

## 2023-01-19 DIAGNOSIS — I110 Hypertensive heart disease with heart failure: Secondary | ICD-10-CM | POA: Diagnosis not present

## 2023-01-19 DIAGNOSIS — G9340 Encephalopathy, unspecified: Secondary | ICD-10-CM | POA: Diagnosis not present

## 2023-01-19 DIAGNOSIS — L309 Dermatitis, unspecified: Secondary | ICD-10-CM | POA: Diagnosis not present

## 2023-01-19 DIAGNOSIS — L280 Lichen simplex chronicus: Secondary | ICD-10-CM | POA: Diagnosis not present

## 2023-01-19 DIAGNOSIS — T84021D Dislocation of internal left hip prosthesis, subsequent encounter: Secondary | ICD-10-CM | POA: Diagnosis not present

## 2023-01-19 DIAGNOSIS — G319 Degenerative disease of nervous system, unspecified: Secondary | ICD-10-CM | POA: Diagnosis not present

## 2023-01-19 DIAGNOSIS — S81812A Laceration without foreign body, left lower leg, initial encounter: Secondary | ICD-10-CM | POA: Diagnosis not present

## 2023-01-20 DIAGNOSIS — I110 Hypertensive heart disease with heart failure: Secondary | ICD-10-CM | POA: Diagnosis not present

## 2023-01-20 DIAGNOSIS — G9340 Encephalopathy, unspecified: Secondary | ICD-10-CM | POA: Diagnosis not present

## 2023-01-20 DIAGNOSIS — I5032 Chronic diastolic (congestive) heart failure: Secondary | ICD-10-CM | POA: Diagnosis not present

## 2023-01-20 DIAGNOSIS — S81812A Laceration without foreign body, left lower leg, initial encounter: Secondary | ICD-10-CM | POA: Diagnosis not present

## 2023-01-20 DIAGNOSIS — G319 Degenerative disease of nervous system, unspecified: Secondary | ICD-10-CM | POA: Diagnosis not present

## 2023-01-20 DIAGNOSIS — T84021D Dislocation of internal left hip prosthesis, subsequent encounter: Secondary | ICD-10-CM | POA: Diagnosis not present

## 2023-01-24 ENCOUNTER — Other Ambulatory Visit: Payer: Self-pay

## 2023-01-24 DIAGNOSIS — I110 Hypertensive heart disease with heart failure: Secondary | ICD-10-CM | POA: Diagnosis not present

## 2023-01-24 DIAGNOSIS — D509 Iron deficiency anemia, unspecified: Secondary | ICD-10-CM

## 2023-01-24 DIAGNOSIS — G9340 Encephalopathy, unspecified: Secondary | ICD-10-CM | POA: Diagnosis not present

## 2023-01-24 DIAGNOSIS — G319 Degenerative disease of nervous system, unspecified: Secondary | ICD-10-CM | POA: Diagnosis not present

## 2023-01-24 DIAGNOSIS — I5032 Chronic diastolic (congestive) heart failure: Secondary | ICD-10-CM | POA: Diagnosis not present

## 2023-01-24 DIAGNOSIS — T84021D Dislocation of internal left hip prosthesis, subsequent encounter: Secondary | ICD-10-CM | POA: Diagnosis not present

## 2023-01-24 DIAGNOSIS — S81812A Laceration without foreign body, left lower leg, initial encounter: Secondary | ICD-10-CM | POA: Diagnosis not present

## 2023-01-25 ENCOUNTER — Inpatient Hospital Stay: Payer: Medicare Other | Attending: Hematology

## 2023-01-25 ENCOUNTER — Other Ambulatory Visit: Payer: Self-pay

## 2023-01-25 ENCOUNTER — Inpatient Hospital Stay (HOSPITAL_BASED_OUTPATIENT_CLINIC_OR_DEPARTMENT_OTHER): Payer: Medicare Other | Admitting: Hematology

## 2023-01-25 VITALS — BP 125/66 | HR 88 | Temp 97.7°F | Resp 16 | Wt 126.5 lb

## 2023-01-25 DIAGNOSIS — M069 Rheumatoid arthritis, unspecified: Secondary | ICD-10-CM | POA: Insufficient documentation

## 2023-01-25 DIAGNOSIS — R5383 Other fatigue: Secondary | ICD-10-CM | POA: Insufficient documentation

## 2023-01-25 DIAGNOSIS — Z79899 Other long term (current) drug therapy: Secondary | ICD-10-CM | POA: Diagnosis not present

## 2023-01-25 DIAGNOSIS — N182 Chronic kidney disease, stage 2 (mild): Secondary | ICD-10-CM | POA: Diagnosis not present

## 2023-01-25 DIAGNOSIS — D509 Iron deficiency anemia, unspecified: Secondary | ICD-10-CM | POA: Insufficient documentation

## 2023-01-25 DIAGNOSIS — D631 Anemia in chronic kidney disease: Secondary | ICD-10-CM | POA: Insufficient documentation

## 2023-01-25 DIAGNOSIS — D649 Anemia, unspecified: Secondary | ICD-10-CM | POA: Diagnosis not present

## 2023-01-25 LAB — CMP (CANCER CENTER ONLY)
ALT: 15 U/L (ref 0–44)
AST: 25 U/L (ref 15–41)
Albumin: 3.8 g/dL (ref 3.5–5.0)
Alkaline Phosphatase: 91 U/L (ref 38–126)
Anion gap: 6 (ref 5–15)
BUN: 35 mg/dL — ABNORMAL HIGH (ref 8–23)
CO2: 28 mmol/L (ref 22–32)
Calcium: 9.6 mg/dL (ref 8.9–10.3)
Chloride: 100 mmol/L (ref 98–111)
Creatinine: 1.33 mg/dL — ABNORMAL HIGH (ref 0.44–1.00)
GFR, Estimated: 38 mL/min — ABNORMAL LOW (ref >60)
Glucose, Bld: 115 mg/dL — ABNORMAL HIGH (ref 70–99)
Potassium: 4.9 mmol/L (ref 3.5–5.1)
Sodium: 134 mmol/L — ABNORMAL LOW (ref 135–145)
Total Bilirubin: 0.4 mg/dL (ref <1.2)
Total Protein: 7.6 g/dL (ref 6.5–8.1)

## 2023-01-25 LAB — CBC WITH DIFFERENTIAL (CANCER CENTER ONLY)
Abs Immature Granulocytes: 0.04 10*3/uL (ref 0.00–0.07)
Basophils Absolute: 0.1 10*3/uL (ref 0.0–0.1)
Basophils Relative: 2 %
Eosinophils Absolute: 0.6 10*3/uL — ABNORMAL HIGH (ref 0.0–0.5)
Eosinophils Relative: 8 %
HCT: 30.2 % — ABNORMAL LOW (ref 36.0–46.0)
Hemoglobin: 9.7 g/dL — ABNORMAL LOW (ref 12.0–15.0)
Immature Granulocytes: 1 %
Lymphocytes Relative: 13 %
Lymphs Abs: 1 10*3/uL (ref 0.7–4.0)
MCH: 27.5 pg (ref 26.0–34.0)
MCHC: 32.1 g/dL (ref 30.0–36.0)
MCV: 85.6 fL (ref 80.0–100.0)
Monocytes Absolute: 0.9 10*3/uL (ref 0.1–1.0)
Monocytes Relative: 11 %
Neutro Abs: 5.3 10*3/uL (ref 1.7–7.7)
Neutrophils Relative %: 65 %
Platelet Count: 292 10*3/uL (ref 150–400)
RBC: 3.53 MIL/uL — ABNORMAL LOW (ref 3.87–5.11)
RDW: 16.7 % — ABNORMAL HIGH (ref 11.5–15.5)
WBC Count: 8 10*3/uL (ref 4.0–10.5)
nRBC: 0 % (ref 0.0–0.2)

## 2023-01-25 LAB — IRON AND IRON BINDING CAPACITY (CC-WL,HP ONLY)
Iron: 42 ug/dL (ref 28–170)
Saturation Ratios: 14 % (ref 10.4–31.8)
TIBC: 311 ug/dL (ref 250–450)
UIBC: 269 ug/dL (ref 148–442)

## 2023-01-25 LAB — VITAMIN B12: Vitamin B-12: 735 pg/mL (ref 180–914)

## 2023-01-25 LAB — FERRITIN: Ferritin: 232 ng/mL (ref 11–307)

## 2023-01-25 MED ORDER — DILTIAZEM HCL ER COATED BEADS 240 MG PO CP24: 240.0000 mg | ORAL_CAPSULE | Freq: Every day | ORAL | 0 refills | Status: DC

## 2023-01-25 NOTE — Progress Notes (Signed)
HEMATOLOGY/ONCOLOGY CLINIC NOTE  Date of Service: 01/25/2023   Patient Care Team: Emilio Aspen, MD as PCP - General (Internal Medicine) Christell Constant, MD as PCP - Cardiology (Cardiology) Kennon Rounds as Physician Assistant (Cardiology)  CHIEF COMPLAINTS/PURPOSE OF CONSULTATION:  Continued management of normocytic anemia.  HISTORY OF PRESENTING ILLNESS:  Please see previous notes for details on initial presentation  INTERVAL HISTORY: [name] is a 89 y.o. female here for follow-up of her normocytic anemia.  Patient was last seen by me on 10/25/2022 and she complained of mild left leg swelling and skin rashes near her right shoulder/neck area.   Patient was admitted to the hospital twice since our last visit, from 12/06/2022 to 12/10/2022 and on 12/30/2022. Before her fist hospitalization, she had left hip dislocation of 12/05/2022. She admitted to the hospital on 12/06/2022 due to confusion, generalized weakness, bilateral lower extremity wounds/skin tears and was diagnosed with acute encephalopathy due to dehydration. She received 2 units of PRBC transfusion on 12/09/2022. She was discharged with wound care instruction and hydration instruction. Patient was admitted to the hospital on 12/30/2022 with complaint of Dermatitis, worsened left leg pain, and erythema of the left lower extremity. She had Unna boot wrap when she was admitted to the ED. She was treated with prednisone and antihistamine and was discharged the same day.   Patient is unaccompanied during this visit. She notes she has been doing fairly well since our last visit. She tolerated her IV iron infusion well at our last visit. During this visit, she complains of fatigue and sleepiness. She has been prescribed hydroxyzine last month for skin itchiness. Patient notes that her fatigue symptoms began after she started taking Hydroxyzine.   Patient notes that she has been itchy for a while.  She has been taking Allopurinol and Cardizem to manage her gout.   She denies any new infection issues, fever, chills, night sweats, unexpected weight loss, chest pain, back pain, abdominal pain, or leg swelling.    MEDICAL HISTORY:  Past Medical History:  Diagnosis Date   Anemia    CHRONIC   Aortic insufficiency    a. 2D echo 08/29/15: EF 55-60%, mild LVH, diastolic dysfunction, elevated LV filling pressure, mild AI, severe LAE, mild RAE, mild TR, dilated descending thoracic aorta just distal to the takeoff of the left subclavian, measuring 4.1 cm, no pericardial effusion.   Arthritis    OA-SOME BACK AND NECK PAIN,  GOES TO CHIROPRACTOR TWICE A MONTH;  HX OF JOINT REPLACEMENTS    Cancer (HCC)    SKIN CANCERS REMOVED FROM LEGS   CKD (chronic kidney disease), stage II    Depression    Essential hypertension    Gait abnormality 09/08/2017   GERD (gastroesophageal reflux disease)    Low sodium levels    Myocarditis (HCC)    PSVT (paroxysmal supraventricular tachycardia) (HCC)    Recurrent Pericarditis    RECURRENT    Thoracic aneurysm    a. Followed by [facility] -last seen in 12/2013 w/ plan for f/u MRA and thoracic surgery f/u in 2 yrs, but patient does not wish to follow any longer.    SURGICAL HISTORY: Past Surgical History:  Procedure Laterality Date   APPENDECTOMY     BREAST EXCISIONAL BIOPSY Left 1992   benign   EXCISION/RELEASE BURSA HIP Left 11/24/2012   Procedure: LEFT HIP BURSECTOMY AND TENDON REPAIR ;  Surgeon: Loanne Drilling, MD;  Location: [facility];  Service: Orthopedics;  Laterality: Left;   HIP CLOSED REDUCTION Left 12/19/2012   Procedure: CLOSED MANIPULATION HIP;  Surgeon: Shelda Pal, MD;  Location: [facility];  Service: Orthopedics;  Laterality: Left;   HIP CLOSED REDUCTION Left 12/05/2013   Procedure: CLOSED MANIPULATION HIP;  Surgeon: Verlee Rossetti, MD;  Location: [facility];  Service: Orthopedics;  Laterality: Left;   HIP CLOSED REDUCTION Left 09/22/2015   Procedure:  CLOSED REDUCTION HIP;  Surgeon: Venita Lick, MD;  Location: [facility];  Service: Orthopedics;  Laterality: Left;   HIP CLOSED REDUCTION Left 12/05/2022   Procedure: CLOSED MANIPULATION HIP;  Surgeon: Ollen Gross, MD;  Location: [facility];  Service: Orthopedics;  Laterality: Left;   JOINT REPLACEMENT     LEFT TOTAL HIP REPLACEMENT AND REVISIONS X 2   OOPHORECTOMY     has a partial of one ovary remaingin   PELVIC LAPAROSCOPY     ovarian cyst removal,    REVISION TOTAL HIP ARTHROPLASTY     left   right Achilles Tendon repair  5/13   TOTAL KNEE ARTHROPLASTY     right   TOTAL KNEE ARTHROPLASTY     left   VAGINAL HYSTERECTOMY     with ovarian cyst removal    SOCIAL HISTORY: Social History   Socioeconomic History   Marital status: Widowed    Spouse name: Not on file   Number of children: Not on file   Years of education: Not on file   Highest education level: Not on file  Occupational History   Not on file  Tobacco Use   Smoking status: Former    Current packs/day: 0.00    Types: Cigarettes    Start date: 76    Quit date: 33    Years since quitting: 59.9   Smokeless tobacco: Never   Tobacco comments:    quit age 13  Vaping Use   Vaping status: Never Used  Substance and Sexual Activity   Alcohol use: No    Alcohol/week: 0.0 standard drinks of alcohol   Drug use: Not Currently   Sexual activity: Not Currently    Birth control/protection: Surgical    Comment: HYST-1st intercourse 89 yo-Fewer than 5 partners  Other Topics Concern   Not on file  Social History Narrative   Diet: very good      Caffeine: 1 1/2 cup coffee (usually decaf)      Married, if yes what year: Widow, 1954      Do you live in a house, apartment, assisted living, condo, trailer, ect: [place], independent living      Is it one or more stories: 2nd level. elevate      How many persons live in your home? 1      Pets: No      Highest level or education completed: 14      Current/Past  profession: Health and safety inspector      Exercise:  Yes                Type and how often: aerobics twice daily         Living Will: Yes   DNR: Yes   POA/HPOA: Yes      Functional Status:   Do you have difficulty bathing or dressing yourself? No   Do you have difficulty preparing food or eating? No   Do you have difficulty managing your medications? No   Do you have difficulty managing your finances? No   Do you have difficulty affording your  medications? No   Social Determinants of Health   Financial Resource Strain: Not on file  Food Insecurity: No Food Insecurity (12/07/2022)   Hunger Vital Sign    Worried About Running Out of Food in the Last Year: Never true    Ran Out of Food in the Last Year: Never true  Transportation Needs: No Transportation Needs (12/07/2022)   PRAPARE - Administrator, Civil Service (Medical): No    Lack of Transportation (Non-Medical): No  Physical Activity: Not on file  Stress: Not on file  Social Connections: Not on file  Intimate Partner Violence: Not At Risk (12/07/2022)   Humiliation, Afraid, Rape, and Kick questionnaire    Fear of Current or Ex-Partner: No    Emotionally Abused: No    Physically Abused: No    Sexually Abused: No    FAMILY HISTORY: Family History  Problem Relation Age of Onset   Hypertension Mother    Stroke Mother    Heart attack Father 24   Heart failure Father    Heart failure Sister    Aortic aneurysm Sister    Aortic aneurysm Sister    Stroke Sister    Pneumonia Brother    Stroke Brother    ALS Other    Hyperlipidemia Daughter    Aortic aneurysm Son    Breast cancer Neg Hx     ALLERGIES:  is allergic to cephalexin, shellfish-derived products, clindamycin/lincomycin, lincomycin, clindamycin hcl, fosfomycin, levofloxacin, penicillamine, sugar-protein-starch, tape, ciprofloxacin, sulfa antibiotics, and sulfites.  MEDICATIONS:  Current Outpatient Medications  Medication Sig Dispense Refill    allopurinol (ZYLOPRIM) 100 MG tablet Take 50 mg by mouth daily.     amoxicillin (AMOXIL) 500 MG capsule Take 2,000 mg by mouth See admin instructions. Take 2,000 mg by mouth one hour prior to dental procedures     B Complex Vitamins (VITAMIN B COMPLEX) TABS Take 1 tablet by mouth daily with breakfast.     carvedilol (COREG) 12.5 MG tablet Take 12.5 mg by mouth 2 (two) times daily with a meal.     Cholecalciferol (VITAMIN D3) 25 MCG (1000 UT) CAPS Take 1,000 Units by mouth daily.     diltiazem (CARDIZEM CD) 240 MG 24 hr capsule Take 1 capsule (240 mg total) by mouth daily. 90 capsule 3   FEROSUL 325 (65 Fe) MG tablet Take 650 mg by mouth daily with breakfast.     furosemide (LASIX) 20 MG tablet Take 1 tablet (20 mg total) by mouth every other day. 45 tablet 3   hydrocerin (EUCERIN) CREA Apply 1 Application topically 2 (two) times daily. Apply to intact skin of bilateral legs and feet twice daily. DO NOT PLACE IN BETWEEN TOES. 454 g 0   hydrOXYzine (ATARAX) 25 MG tablet Take 1 tablet (25 mg total) by mouth every 4 (four) hours as needed for itching. 20 tablet 0   loratadine (CLARITIN) 10 MG tablet Take 10 mg by mouth 2 (two) times daily.     melatonin 3 MG TABS tablet Take 1 tablet (3 mg total) by mouth at bedtime as needed (insomnia).     MITIGARE 0.6 MG CAPS Take 0.6 mg by mouth every other day.     Multiple Vitamins-Minerals (SYSTANE ICAPS AREDS2 PO) Take 1 capsule by mouth in the morning and at bedtime.     predniSONE (DELTASONE) 20 MG tablet 2 tabs po daily x 4 days 8 tablet 0   sodium chloride 1 g tablet Take 1 g by mouth daily.  triamcinolone cream (KENALOG) 0.1 % Apply topically 3 (three) times daily. To areas of psoariatic rash. Do not apply to face. 30 g 0   TYLENOL 500 MG tablet Take 500-1,000 mg by mouth every 6 (six) hours as needed for mild pain or headache.     No current facility-administered medications for this visit.    REVIEW OF SYSTEMS:   .10 Point review of Systems was  done is negative except as noted above.  PHYSICAL EXAMINATION: ECOG PERFORMANCE STATUS: 0 - Asymptomatic  .BP 125/66   Pulse 88   Temp 97.7 F (36.5 C)   Resp 16   Wt 126 lb 8 oz (57.4 kg)   SpO2 94%   BMI 21.71 kg/m   Exam was given in a chair.  NAD GENERAL:alert, in no acute distress and comfortable SKIN: no acute rashes, no significant lesions EYES: conjunctiva are pink and non-injected, sclera anicteric NECK: supple, no JVD LYMPH:  no palpable lymphadenopathy in the cervical, axillary or inguinal regions LUNGS: clear to auscultation b/l with normal respiratory effort HEART: regular rate & rhythm ABDOMEN:  normoactive bowel sounds , non tender, not distended. Extremity: no pedal edema PSYCH: alert & oriented x 3 with fluent speech NEURO: no focal motor/sensory deficits  LABORATORY DATA:   I have reviewed the data as listed  .    Latest Ref Rng & Units 01/25/2023   11:21 AM 12/30/2022    5:45 PM 12/10/2022    5:15 AM  CBC  WBC 4.0 - 10.5 K/uL 8.0  8.6  8.0   Hemoglobin 12.0 - 15.0 g/dL 9.7  9.2  9.9   Hematocrit 36.0 - 46.0 % 30.2  29.2  30.8   Platelets 150 - 400 K/uL 292  370  305    . CBC    Component Value Date/Time   WBC 8.0 01/25/2023 1121   WBC 8.6 12/30/2022 1745   RBC 3.53 (L) 01/25/2023 1121   HGB 9.7 (L) 01/25/2023 1121   HGB 10.8 (L) 12/26/2007 1539   HCT 30.2 (L) 01/25/2023 1121   HCT 28.2 (L) 02/17/2021 1001   HCT 33.4 (L) 12/26/2007 1539   PLT 292 01/25/2023 1121   PLT 314 12/26/2007 1539   MCV 85.6 01/25/2023 1121   MCV 88.5 12/26/2007 1539   MCH 27.5 01/25/2023 1121   MCHC 32.1 01/25/2023 1121   RDW 16.7 (H) 01/25/2023 1121   RDW 15.3 (H) 12/26/2007 1539   LYMPHSABS 1.0 01/25/2023 1121   LYMPHSABS 2.2 12/26/2007 1539   MONOABS 0.9 01/25/2023 1121   MONOABS 0.8 12/26/2007 1539   EOSABS 0.6 (H) 01/25/2023 1121   EOSABS 0.6 (H) 12/26/2007 1539   BASOSABS 0.1 01/25/2023 1121   BASOSABS 0.1 12/26/2007 1539     .    Latest Ref  Rng & Units 01/25/2023   11:21 AM 12/30/2022    5:45 PM 12/10/2022    5:15 AM  CMP  Glucose 70 - 99 mg/dL 161  82  98   BUN 8 - 23 mg/dL 35  39  36   Creatinine 0.44 - 1.00 mg/dL 0.96  0.45  4.09   Sodium 135 - 145 mmol/L 134  136  131   Potassium 3.5 - 5.1 mmol/L 4.9  4.8  3.9   Chloride 98 - 111 mmol/L 100  101  102   CO2 22 - 32 mmol/L 28  23  22    Calcium 8.9 - 10.3 mg/dL 9.6  9.0  8.3   Total Protein 6.5 -  8.1 g/dL 7.6  6.9    Total Bilirubin <1.2 mg/dL 0.4  0.6    Alkaline Phos 38 - 126 U/L 91  76    AST 15 - 41 U/L 25  24    ALT 0 - 44 U/L 15  17     Component     Latest Ref Rng & Units 03/06/2020  Iron     41 - 142 ug/dL 35 (L)  TIBC     130 - 444 ug/dL 865 (H)  Saturation Ratios     21 - 57 % 8 (L)  UIBC     120 - 384 ug/dL 784 (H)  Folate, Hemolysate     Not Estab. ng/mL 605.0  HCT     34.0 - 46.6 % 29.6 (L)  Folate, RBC     >498 ng/mL 2,044  Erythropoietin     2.6 - 18.5 mIU/mL 14.9  RA Latex Turbid.     <14.0 IU/mL 99.5 (H)  Vitamin B12     180 - 914 pg/mL 841  Ferritin     11 - 307 ng/mL 35  Sed Rate     0 - 22 mm/hr 22   . Lab Results  Component Value Date   IRON 42 01/25/2023   TIBC 311 01/25/2023   IRONPCTSAT 14 01/25/2023   (Iron and TIBC)  Lab Results  Component Value Date   FERRITIN 232 01/25/2023     RADIOGRAPHIC STUDIES: I have personally reviewed the radiological images as listed and agreed with the findings in the report. VAS [place] LOWER EXTREMITY VENOUS (DVT) (7a-7p)  Result Date: 12/31/2022  Lower Venous DVT Study Patient Name:  [name]  Date of Exam:   12/30/2022 Medical Rec #: [id number]          Accession #:    [id number] Date of Birth: 06/05/1929         Patient Gender: F Patient Age:   28 years Exam Location:  [facility] Procedure:      VAS [place] LOWER EXTREMITY VENOUS (DVT) Referring Phys: [facility] PFEIFFER --------------------------------------------------------------------------------  Indications: Swelling.   Risk Factors: None identified. Limitations: Poor ultrasound/tissue interface and open wound. Comparison Study: No prior studies. Performing Technologist: Chanda Busing RVT  Examination Guidelines: A complete evaluation includes B-mode imaging, spectral Doppler, color Doppler, and power Doppler as needed of all accessible portions of each vessel. Bilateral testing is considered an integral part of a complete examination. Limited examinations for reoccurring indications may be performed as noted. The reflux portion of the exam is performed with the patient in reverse Trendelenburg.  +-----+---------------+---------+-----------+----------+--------------+ RIGHTCompressibilityPhasicitySpontaneityPropertiesThrombus Aging +-----+---------------+---------+-----------+----------+--------------+ CFV  Full           Yes      Yes                                 +-----+---------------+---------+-----------+----------+--------------+   +---------+---------------+---------+-----------+----------+--------------+ LEFT     CompressibilityPhasicitySpontaneityPropertiesThrombus Aging +---------+---------------+---------+-----------+----------+--------------+ CFV      Full           Yes      Yes                                 +---------+---------------+---------+-----------+----------+--------------+ SFJ      Full                                                        +---------+---------------+---------+-----------+----------+--------------+  FV Prox  Full                                                        +---------+---------------+---------+-----------+----------+--------------+ FV Mid   Full                                                        +---------+---------------+---------+-----------+----------+--------------+ FV DistalFull                                                        +---------+---------------+---------+-----------+----------+--------------+ PFV       Full                                                        +---------+---------------+---------+-----------+----------+--------------+ POP      Full           Yes      Yes                                 +---------+---------------+---------+-----------+----------+--------------+ PTV      Full                                                        +---------+---------------+---------+-----------+----------+--------------+ PERO     Full                                                        +---------+---------------+---------+-----------+----------+--------------+    Summary: RIGHT: - No evidence of common femoral vein obstruction.   LEFT: - There is no evidence of deep vein thrombosis in the lower extremity.  - No cystic structure found in the popliteal fossa.  *See table(s) above for measurements and observations. Electronically signed by Sherald Hess MD on 12/31/2022 at 9:50:09 AM.    Final    DG HIP UNILAT WITH PELVIS 2-3 VIEWS LEFT  Result Date: 12/30/2022 CLINICAL DATA:  Persistent pain after hip dislocation last month. EXAM: DG HIP (WITH OR WITHOUT PELVIS) 2-3V LEFT COMPARISON:  12/05/2022 FINDINGS: Previous left hip arthroplasty with non cemented femoral component and 3 screws fixing the acetabular cup. There is no evidence of acute fracture or dislocation. No focal bone lesions are identified. Mild degenerative changes in the right hip. Moderate degenerative changes in the lower lumbar spine. Pelvis appears intact. SI joints and symphysis pubis are not displaced. IMPRESSION: Left hip arthroplasty appears well seated. No acute bony abnormalities. Electronically Signed   By: Chrissie Noa  Andria Meuse M.D.   On: 12/30/2022 19:15    ASSESSMENT & PLAN:   89 yo with   1) Chronic normocytic anemia Due to CKD + iron deficiency + anemia of chronic inflammation from possible RA 2) RA -not currently on treatment  PLAN: -Discussed lab results from today, 01/25/2023, in detail with  the patient. CBC shows low hemoglobin of 9.7 g/dL with hematocrit of 91.4%. CMP pending.  Ferritin 232 with iron saturation of 14% B12 wnl at 735 Continue B complex and B12 replacement - no indication for additional IV iron at this time -Suggested to use Sarna lotion to see if it helps her skin itchiness.  -Recommend to follow-up with PCP and Rheumatologist regarding Allopurinol and Cardizem medication since it can cause bone marrow suppression, skin rash/itchiness. -Discussed to drink at least 2 L of water everyday. -Answered all patient's questions.  -Discussed with the patient that her symptoms of fatigue and sleepiness is most likely due to Hydroxyzine.    FOLLOW UP: RTC with Dr Candise Che with labs in 4 months  The total time spent in the appointment was 20 minutes* .  All of the patient's questions were answered with apparent satisfaction. The patient knows to call the clinic with any problems, questions or concerns.   Wyvonnia Lora MD MS AAHIVMS [facility] Hematology/Oncology Physician [place] [place]  .*Total Encounter Time as defined by the Centers for Medicare and Medicaid Services includes, in addition to the face-to-face time of a patient visit (documented in the note above) non-face-to-face time: obtaining and reviewing outside history, ordering and reviewing medications, tests or procedures, care coordination (communications with other health care professionals or caregivers) and documentation in the medical record.   I,Param Shah,acting as a Neurosurgeon for Wyvonnia Lora, MD.,have documented all relevant documentation on the behalf of Wyvonnia Lora, MD,as directed by  Wyvonnia Lora, MD while in the presence of Wyvonnia Lora, MD.  .I have reviewed the above documentation for accuracy and completeness, and I agree with the above. Johney Maine MD

## 2023-01-26 DIAGNOSIS — S81812A Laceration without foreign body, left lower leg, initial encounter: Secondary | ICD-10-CM | POA: Diagnosis not present

## 2023-01-26 DIAGNOSIS — G9340 Encephalopathy, unspecified: Secondary | ICD-10-CM | POA: Diagnosis not present

## 2023-01-26 DIAGNOSIS — I110 Hypertensive heart disease with heart failure: Secondary | ICD-10-CM | POA: Diagnosis not present

## 2023-01-26 DIAGNOSIS — I5032 Chronic diastolic (congestive) heart failure: Secondary | ICD-10-CM | POA: Diagnosis not present

## 2023-01-26 DIAGNOSIS — T84021D Dislocation of internal left hip prosthesis, subsequent encounter: Secondary | ICD-10-CM | POA: Diagnosis not present

## 2023-01-26 DIAGNOSIS — G319 Degenerative disease of nervous system, unspecified: Secondary | ICD-10-CM | POA: Diagnosis not present

## 2023-01-27 ENCOUNTER — Telehealth: Payer: Self-pay | Admitting: Hematology

## 2023-01-27 DIAGNOSIS — R531 Weakness: Secondary | ICD-10-CM | POA: Diagnosis not present

## 2023-01-27 DIAGNOSIS — I5020 Unspecified systolic (congestive) heart failure: Secondary | ICD-10-CM | POA: Diagnosis not present

## 2023-01-27 DIAGNOSIS — R269 Unspecified abnormalities of gait and mobility: Secondary | ICD-10-CM | POA: Diagnosis not present

## 2023-01-27 DIAGNOSIS — Z515 Encounter for palliative care: Secondary | ICD-10-CM | POA: Diagnosis not present

## 2023-01-27 NOTE — Telephone Encounter (Signed)
Patient is aware of scheduled appointment times/dates

## 2023-01-31 ENCOUNTER — Encounter: Payer: Self-pay | Admitting: Hematology

## 2023-02-03 ENCOUNTER — Encounter: Payer: Self-pay | Admitting: Hematology

## 2023-02-14 ENCOUNTER — Encounter: Payer: Self-pay | Admitting: Hematology

## 2023-02-22 DIAGNOSIS — Z515 Encounter for palliative care: Secondary | ICD-10-CM | POA: Diagnosis not present

## 2023-02-22 DIAGNOSIS — R531 Weakness: Secondary | ICD-10-CM | POA: Diagnosis not present

## 2023-02-22 DIAGNOSIS — R269 Unspecified abnormalities of gait and mobility: Secondary | ICD-10-CM | POA: Diagnosis not present

## 2023-02-22 DIAGNOSIS — I5020 Unspecified systolic (congestive) heart failure: Secondary | ICD-10-CM | POA: Diagnosis not present

## 2023-02-23 DIAGNOSIS — M1991 Primary osteoarthritis, unspecified site: Secondary | ICD-10-CM | POA: Diagnosis not present

## 2023-02-23 DIAGNOSIS — M1009 Idiopathic gout, multiple sites: Secondary | ICD-10-CM | POA: Diagnosis not present

## 2023-02-23 DIAGNOSIS — L409 Psoriasis, unspecified: Secondary | ICD-10-CM | POA: Diagnosis not present

## 2023-02-23 DIAGNOSIS — Z79899 Other long term (current) drug therapy: Secondary | ICD-10-CM | POA: Diagnosis not present

## 2023-02-23 DIAGNOSIS — R768 Other specified abnormal immunological findings in serum: Secondary | ICD-10-CM | POA: Diagnosis not present

## 2023-02-25 DIAGNOSIS — M6259 Muscle wasting and atrophy, not elsewhere classified, multiple sites: Secondary | ICD-10-CM | POA: Diagnosis not present

## 2023-02-25 DIAGNOSIS — M25511 Pain in right shoulder: Secondary | ICD-10-CM | POA: Diagnosis not present

## 2023-02-25 DIAGNOSIS — R2681 Unsteadiness on feet: Secondary | ICD-10-CM | POA: Diagnosis not present

## 2023-02-25 DIAGNOSIS — R2689 Other abnormalities of gait and mobility: Secondary | ICD-10-CM | POA: Diagnosis not present

## 2023-02-25 DIAGNOSIS — M25552 Pain in left hip: Secondary | ICD-10-CM | POA: Diagnosis not present

## 2023-02-25 DIAGNOSIS — M6281 Muscle weakness (generalized): Secondary | ICD-10-CM | POA: Diagnosis not present

## 2023-02-25 DIAGNOSIS — M19011 Primary osteoarthritis, right shoulder: Secondary | ICD-10-CM | POA: Insufficient documentation

## 2023-03-01 DIAGNOSIS — M25552 Pain in left hip: Secondary | ICD-10-CM | POA: Diagnosis not present

## 2023-03-01 DIAGNOSIS — M6281 Muscle weakness (generalized): Secondary | ICD-10-CM | POA: Diagnosis not present

## 2023-03-01 DIAGNOSIS — R2681 Unsteadiness on feet: Secondary | ICD-10-CM | POA: Diagnosis not present

## 2023-03-01 DIAGNOSIS — M6259 Muscle wasting and atrophy, not elsewhere classified, multiple sites: Secondary | ICD-10-CM | POA: Diagnosis not present

## 2023-03-01 DIAGNOSIS — M25511 Pain in right shoulder: Secondary | ICD-10-CM | POA: Diagnosis not present

## 2023-03-01 DIAGNOSIS — R2689 Other abnormalities of gait and mobility: Secondary | ICD-10-CM | POA: Diagnosis not present

## 2023-03-03 DIAGNOSIS — M25552 Pain in left hip: Secondary | ICD-10-CM | POA: Diagnosis not present

## 2023-03-03 DIAGNOSIS — R2681 Unsteadiness on feet: Secondary | ICD-10-CM | POA: Diagnosis not present

## 2023-03-03 DIAGNOSIS — R2689 Other abnormalities of gait and mobility: Secondary | ICD-10-CM | POA: Diagnosis not present

## 2023-03-03 DIAGNOSIS — M6281 Muscle weakness (generalized): Secondary | ICD-10-CM | POA: Diagnosis not present

## 2023-03-03 DIAGNOSIS — M6259 Muscle wasting and atrophy, not elsewhere classified, multiple sites: Secondary | ICD-10-CM | POA: Diagnosis not present

## 2023-03-03 DIAGNOSIS — M25511 Pain in right shoulder: Secondary | ICD-10-CM | POA: Diagnosis not present

## 2023-03-15 DIAGNOSIS — M6281 Muscle weakness (generalized): Secondary | ICD-10-CM | POA: Diagnosis not present

## 2023-03-15 DIAGNOSIS — R2681 Unsteadiness on feet: Secondary | ICD-10-CM | POA: Diagnosis not present

## 2023-03-15 DIAGNOSIS — M25552 Pain in left hip: Secondary | ICD-10-CM | POA: Diagnosis not present

## 2023-03-15 DIAGNOSIS — M6259 Muscle wasting and atrophy, not elsewhere classified, multiple sites: Secondary | ICD-10-CM | POA: Diagnosis not present

## 2023-03-15 DIAGNOSIS — M25511 Pain in right shoulder: Secondary | ICD-10-CM | POA: Diagnosis not present

## 2023-03-15 DIAGNOSIS — R2689 Other abnormalities of gait and mobility: Secondary | ICD-10-CM | POA: Diagnosis not present

## 2023-03-18 DIAGNOSIS — M6281 Muscle weakness (generalized): Secondary | ICD-10-CM | POA: Diagnosis not present

## 2023-03-18 DIAGNOSIS — M25552 Pain in left hip: Secondary | ICD-10-CM | POA: Diagnosis not present

## 2023-03-18 DIAGNOSIS — M6259 Muscle wasting and atrophy, not elsewhere classified, multiple sites: Secondary | ICD-10-CM | POA: Diagnosis not present

## 2023-03-18 DIAGNOSIS — R2681 Unsteadiness on feet: Secondary | ICD-10-CM | POA: Diagnosis not present

## 2023-03-18 DIAGNOSIS — R2689 Other abnormalities of gait and mobility: Secondary | ICD-10-CM | POA: Diagnosis not present

## 2023-03-18 DIAGNOSIS — M25511 Pain in right shoulder: Secondary | ICD-10-CM | POA: Diagnosis not present

## 2023-03-22 DIAGNOSIS — R2689 Other abnormalities of gait and mobility: Secondary | ICD-10-CM | POA: Diagnosis not present

## 2023-03-22 DIAGNOSIS — R0902 Hypoxemia: Secondary | ICD-10-CM | POA: Diagnosis not present

## 2023-03-22 DIAGNOSIS — J849 Interstitial pulmonary disease, unspecified: Secondary | ICD-10-CM | POA: Diagnosis not present

## 2023-03-22 DIAGNOSIS — M6259 Muscle wasting and atrophy, not elsewhere classified, multiple sites: Secondary | ICD-10-CM | POA: Diagnosis not present

## 2023-03-22 DIAGNOSIS — D638 Anemia in other chronic diseases classified elsewhere: Secondary | ICD-10-CM | POA: Diagnosis not present

## 2023-03-22 DIAGNOSIS — M6281 Muscle weakness (generalized): Secondary | ICD-10-CM | POA: Diagnosis not present

## 2023-03-22 DIAGNOSIS — I10 Essential (primary) hypertension: Secondary | ICD-10-CM | POA: Diagnosis not present

## 2023-03-22 DIAGNOSIS — M25552 Pain in left hip: Secondary | ICD-10-CM | POA: Diagnosis not present

## 2023-03-22 DIAGNOSIS — Z8739 Personal history of other diseases of the musculoskeletal system and connective tissue: Secondary | ICD-10-CM | POA: Diagnosis not present

## 2023-03-22 DIAGNOSIS — R5382 Chronic fatigue, unspecified: Secondary | ICD-10-CM | POA: Diagnosis not present

## 2023-03-22 DIAGNOSIS — R2681 Unsteadiness on feet: Secondary | ICD-10-CM | POA: Diagnosis not present

## 2023-03-22 DIAGNOSIS — M25511 Pain in right shoulder: Secondary | ICD-10-CM | POA: Diagnosis not present

## 2023-03-22 DIAGNOSIS — I5032 Chronic diastolic (congestive) heart failure: Secondary | ICD-10-CM | POA: Diagnosis not present

## 2023-03-22 DIAGNOSIS — R0609 Other forms of dyspnea: Secondary | ICD-10-CM | POA: Diagnosis not present

## 2023-03-23 ENCOUNTER — Encounter: Payer: Self-pay | Admitting: Podiatry

## 2023-03-23 ENCOUNTER — Ambulatory Visit (INDEPENDENT_AMBULATORY_CARE_PROVIDER_SITE_OTHER): Payer: Medicare Other | Admitting: Podiatry

## 2023-03-23 ENCOUNTER — Encounter: Payer: Self-pay | Admitting: Hematology

## 2023-03-23 VITALS — Ht 64.0 in | Wt 126.5 lb

## 2023-03-23 DIAGNOSIS — B351 Tinea unguium: Secondary | ICD-10-CM | POA: Diagnosis not present

## 2023-03-23 DIAGNOSIS — M79675 Pain in left toe(s): Secondary | ICD-10-CM | POA: Diagnosis not present

## 2023-03-23 DIAGNOSIS — N1831 Chronic kidney disease, stage 3a: Secondary | ICD-10-CM

## 2023-03-23 DIAGNOSIS — M79674 Pain in right toe(s): Secondary | ICD-10-CM

## 2023-03-23 DIAGNOSIS — L608 Other nail disorders: Secondary | ICD-10-CM

## 2023-03-23 DIAGNOSIS — L97501 Non-pressure chronic ulcer of other part of unspecified foot limited to breakdown of skin: Secondary | ICD-10-CM | POA: Insufficient documentation

## 2023-03-23 NOTE — Progress Notes (Signed)
 This patient returns to the office for evaluation and treatment of long thick painful nails .  This patient is unable to trim his own nails since the patient cannot reach his feet.  Patient says the nails are painful walking and wearing his shoes.   She has painful callus on the bottom of her right big toe. Which has improved.  She returns for preventive foot care services.  General Appearance  Alert, conversant and in no acute stress.  Vascular  Dorsalis pedis and posterior tibial  pulses are  weakly palpable  bilaterally.  Capillary return is within normal limits  bilaterally. Temperature is within normal limits  bilaterally.  Neurologic  Senn-Weinstein monofilament wire test within normal limits  bilaterally. Muscle power within normal limits bilaterally.  Nails Thick disfigured discolored nails with subungual debris  from hallux to fifth toes bilaterally. No evidence of bacterial infection or drainage bilaterally. Pincer fourth toenails  B/l.  Orthopedic  No limitations of motion  feet .  No crepitus or effusions noted.  No bony pathology or digital deformities noted. 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 HAV left with hammer toe second left.  Skin  normotropic  bilaterally.  No signs of infections or ulcers noted.       Onychomycosis  Pain in toes right foot  Pain in toes left foot    Debridement  of nails  1-5  B/L with a nail nipper.  Nails were then filed using a dremel tool with no incidents.   Upon debriding her nails she was found to have an open wound medial aspect second toe left foot.  This was caused by thickness of hallux nail left halux.  Open wound was bandaged with neosporin/DSD and toe cap.  Dsp surgical socks and told to d/c shoes and socks.  Peroxide wound and bandage   .  Call if condition worsens.  RTC 9 weeks     Cordella Bold DPM

## 2023-03-24 DIAGNOSIS — R2689 Other abnormalities of gait and mobility: Secondary | ICD-10-CM | POA: Diagnosis not present

## 2023-03-24 DIAGNOSIS — M6281 Muscle weakness (generalized): Secondary | ICD-10-CM | POA: Diagnosis not present

## 2023-03-24 DIAGNOSIS — R2681 Unsteadiness on feet: Secondary | ICD-10-CM | POA: Diagnosis not present

## 2023-03-24 DIAGNOSIS — M25552 Pain in left hip: Secondary | ICD-10-CM | POA: Diagnosis not present

## 2023-03-24 DIAGNOSIS — M6259 Muscle wasting and atrophy, not elsewhere classified, multiple sites: Secondary | ICD-10-CM | POA: Diagnosis not present

## 2023-03-24 DIAGNOSIS — M25511 Pain in right shoulder: Secondary | ICD-10-CM | POA: Diagnosis not present

## 2023-03-28 ENCOUNTER — Encounter: Payer: Self-pay | Admitting: Podiatrist

## 2023-03-28 DIAGNOSIS — R0602 Shortness of breath: Secondary | ICD-10-CM | POA: Diagnosis not present

## 2023-03-28 DIAGNOSIS — I77819 Aortic ectasia, unspecified site: Secondary | ICD-10-CM | POA: Diagnosis not present

## 2023-03-28 DIAGNOSIS — R0609 Other forms of dyspnea: Secondary | ICD-10-CM | POA: Diagnosis not present

## 2023-03-28 DIAGNOSIS — J849 Interstitial pulmonary disease, unspecified: Secondary | ICD-10-CM | POA: Diagnosis not present

## 2023-03-29 ENCOUNTER — Encounter: Payer: Self-pay | Admitting: Podiatrist

## 2023-03-29 ENCOUNTER — Encounter: Payer: Self-pay | Admitting: Hematology

## 2023-03-29 ENCOUNTER — Ambulatory Visit (INDEPENDENT_AMBULATORY_CARE_PROVIDER_SITE_OTHER): Payer: Medicare Other | Admitting: Podiatrist

## 2023-03-29 DIAGNOSIS — L97521 Non-pressure chronic ulcer of other part of left foot limited to breakdown of skin: Secondary | ICD-10-CM

## 2023-03-29 MED ORDER — MUPIROCIN 2 % EX OINT: 1.0000 | TOPICAL_OINTMENT | Freq: Two times a day (BID) | CUTANEOUS | 2 refills | Status: DC

## 2023-03-29 MED ORDER — DOXYCYCLINE HYCLATE 100 MG PO TABS: 100.0000 mg | ORAL_TABLET | Freq: Two times a day (BID) | ORAL | 0 refills | Status: DC

## 2023-03-29 NOTE — Patient Instructions (Signed)
 DRESSING CHANGES LEFT SECOND TOE       CLEANSE ULCER WITH SOAP AND WATER OR SALINE RINSE   DAB DRY WITH GAUZE SPONGE.   APPLY A LIGHT AMOUNT OF MUPIROCIN  OINTMENT (OR POLY SPORIN OR NEOSPORIN)  TO BASE OF ULCER.   APPLY OUTER DRESSING/BAND-AID   CHANGE THE DRESSING ONCE DAILY   IF YOU EXPERIENCE ANY FEVER, CHILLS, NIGHTSWEATS, NAUSEA OR VOMITING, ELEVATED OR LOW BLOOD SUGARS, REPORT TO EMERGENCY ROOM.   IF YOU EXPERIENCE INCREASED REDNESS, PAIN, SWELLING, DISCOLORATION, ODOR, PUS, DRAINAGE OR WARMTH OF YOUR FOOT, REPORT TO EMERGENCY ROOM.

## 2023-03-29 NOTE — Progress Notes (Signed)
 Chief Complaint  Patient presents with   Nail Problem    RM#13 left 2nd toe swollen and red/ had nails trimmed last week possible infection area looks like an ulcer with minimal drainage.     HPI: Patient is 89 y.o. female who presents today with her son for a red and swollen left second toe. She relates she had her nails trimmed last week and it was red at that time but it wasn't bothersome at that time.     Allergies  Allergen Reactions   Cephalexin Other (See Comments)    Pt states this caused eye problems and STROKE-LIKE SYMPTOMS!!   Shellfish-Derived Products Hives and Rash   Clindamycin /Lincomycin Rash   Lincomycin Rash   Clindamycin  Hcl Other (See Comments)    Per pharmacist   Fosfomycin Other (See Comments)    Disorientation   Levofloxacin Other (See Comments)    Reaction not recalled by the patient   Penicillamine Other (See Comments)    Reaction not recalled by the patient   Sugar-Protein-Starch Other (See Comments)    Sugary foods cause legs to cramp   Tape Other (See Comments)    Please use paper tape or Coban Wrap; patient's skin tears VERY easily!!   Ciprofloxacin Rash   Sulfa Antibiotics Hives and Rash   Sulfites Hives, Swelling and Rash    Review of systems is negative except as noted in the HPI.  Denies nausea/ vomiting/ fevers/ chills or night sweats.   Denies difficulty breathing, denies calf pain or tenderness  Physical Exam  Patient is awake, alert, and oriented x 3.  In no acute distress.    Vascular status is intact with palpable pedal pulses DP and PT bilateral and capillary refill time less than 3 seconds bilateral.  No edema or erythema noted.   Neurological exam reveals epicritic and protective sensation grossly intact bilateral.   Dermatological exam reveals ulcer on the medial distal portion of the left second toe where it abuts the first. Pain on palpation is noted. Superficial to breakdown of skin is noted.   Musculoskeletal exam:  hammertoe deformity left foot 2-5.  Redness and swelling of the second to is noted left foot.     Assessment:   ICD-10-CM   1. Skin ulcer of second toe of left foot, limited to breakdown of skin (HCC)  L97.521        Plan: Discussed exam findings.  Recommended mupirocin  ointment to be applied to the toe daily with a dressing.  Also recommended doxycycline  100mg  bid x 10 days.  She will return in 1-2 weeks for recheck and will call sooner if any concerns arise.

## 2023-03-30 DIAGNOSIS — M25511 Pain in right shoulder: Secondary | ICD-10-CM | POA: Diagnosis not present

## 2023-03-30 DIAGNOSIS — M6259 Muscle wasting and atrophy, not elsewhere classified, multiple sites: Secondary | ICD-10-CM | POA: Diagnosis not present

## 2023-03-30 DIAGNOSIS — R2689 Other abnormalities of gait and mobility: Secondary | ICD-10-CM | POA: Diagnosis not present

## 2023-03-30 DIAGNOSIS — R2681 Unsteadiness on feet: Secondary | ICD-10-CM | POA: Diagnosis not present

## 2023-03-30 DIAGNOSIS — M6281 Muscle weakness (generalized): Secondary | ICD-10-CM | POA: Diagnosis not present

## 2023-03-30 DIAGNOSIS — M25552 Pain in left hip: Secondary | ICD-10-CM | POA: Diagnosis not present

## 2023-03-31 DIAGNOSIS — M6281 Muscle weakness (generalized): Secondary | ICD-10-CM | POA: Diagnosis not present

## 2023-03-31 DIAGNOSIS — R2681 Unsteadiness on feet: Secondary | ICD-10-CM | POA: Diagnosis not present

## 2023-03-31 DIAGNOSIS — M6259 Muscle wasting and atrophy, not elsewhere classified, multiple sites: Secondary | ICD-10-CM | POA: Diagnosis not present

## 2023-03-31 DIAGNOSIS — M25552 Pain in left hip: Secondary | ICD-10-CM | POA: Diagnosis not present

## 2023-03-31 DIAGNOSIS — M25511 Pain in right shoulder: Secondary | ICD-10-CM | POA: Diagnosis not present

## 2023-03-31 DIAGNOSIS — R2689 Other abnormalities of gait and mobility: Secondary | ICD-10-CM | POA: Diagnosis not present

## 2023-04-04 ENCOUNTER — Ambulatory Visit: Payer: Medicare Other | Admitting: Pulmonary Disease

## 2023-04-05 DIAGNOSIS — M6259 Muscle wasting and atrophy, not elsewhere classified, multiple sites: Secondary | ICD-10-CM | POA: Diagnosis not present

## 2023-04-05 DIAGNOSIS — M25552 Pain in left hip: Secondary | ICD-10-CM | POA: Diagnosis not present

## 2023-04-05 DIAGNOSIS — R2681 Unsteadiness on feet: Secondary | ICD-10-CM | POA: Diagnosis not present

## 2023-04-05 DIAGNOSIS — R2689 Other abnormalities of gait and mobility: Secondary | ICD-10-CM | POA: Diagnosis not present

## 2023-04-05 DIAGNOSIS — M6281 Muscle weakness (generalized): Secondary | ICD-10-CM | POA: Diagnosis not present

## 2023-04-05 DIAGNOSIS — M25511 Pain in right shoulder: Secondary | ICD-10-CM | POA: Diagnosis not present

## 2023-04-06 DIAGNOSIS — D638 Anemia in other chronic diseases classified elsewhere: Secondary | ICD-10-CM | POA: Diagnosis not present

## 2023-04-06 DIAGNOSIS — Z23 Encounter for immunization: Secondary | ICD-10-CM | POA: Diagnosis not present

## 2023-04-06 DIAGNOSIS — N1832 Chronic kidney disease, stage 3b: Secondary | ICD-10-CM | POA: Diagnosis not present

## 2023-04-06 DIAGNOSIS — R5382 Chronic fatigue, unspecified: Secondary | ICD-10-CM | POA: Diagnosis not present

## 2023-04-06 DIAGNOSIS — R0609 Other forms of dyspnea: Secondary | ICD-10-CM | POA: Diagnosis not present

## 2023-04-06 DIAGNOSIS — I5032 Chronic diastolic (congestive) heart failure: Secondary | ICD-10-CM | POA: Diagnosis not present

## 2023-04-06 DIAGNOSIS — Z8739 Personal history of other diseases of the musculoskeletal system and connective tissue: Secondary | ICD-10-CM | POA: Diagnosis not present

## 2023-04-06 DIAGNOSIS — J849 Interstitial pulmonary disease, unspecified: Secondary | ICD-10-CM | POA: Diagnosis not present

## 2023-04-06 DIAGNOSIS — Z0000 Encounter for general adult medical examination without abnormal findings: Secondary | ICD-10-CM | POA: Diagnosis not present

## 2023-04-06 DIAGNOSIS — Z1331 Encounter for screening for depression: Secondary | ICD-10-CM | POA: Diagnosis not present

## 2023-04-06 DIAGNOSIS — M059 Rheumatoid arthritis with rheumatoid factor, unspecified: Secondary | ICD-10-CM | POA: Diagnosis not present

## 2023-04-06 DIAGNOSIS — E871 Hypo-osmolality and hyponatremia: Secondary | ICD-10-CM | POA: Diagnosis not present

## 2023-04-06 DIAGNOSIS — I10 Essential (primary) hypertension: Secondary | ICD-10-CM | POA: Diagnosis not present

## 2023-04-06 DIAGNOSIS — R0902 Hypoxemia: Secondary | ICD-10-CM | POA: Diagnosis not present

## 2023-04-07 DIAGNOSIS — R2681 Unsteadiness on feet: Secondary | ICD-10-CM | POA: Diagnosis not present

## 2023-04-07 DIAGNOSIS — R2689 Other abnormalities of gait and mobility: Secondary | ICD-10-CM | POA: Diagnosis not present

## 2023-04-07 DIAGNOSIS — M6259 Muscle wasting and atrophy, not elsewhere classified, multiple sites: Secondary | ICD-10-CM | POA: Diagnosis not present

## 2023-04-07 DIAGNOSIS — M6281 Muscle weakness (generalized): Secondary | ICD-10-CM | POA: Diagnosis not present

## 2023-04-07 DIAGNOSIS — M25552 Pain in left hip: Secondary | ICD-10-CM | POA: Diagnosis not present

## 2023-04-07 DIAGNOSIS — M25511 Pain in right shoulder: Secondary | ICD-10-CM | POA: Diagnosis not present

## 2023-04-11 DIAGNOSIS — R2689 Other abnormalities of gait and mobility: Secondary | ICD-10-CM | POA: Diagnosis not present

## 2023-04-11 DIAGNOSIS — M25552 Pain in left hip: Secondary | ICD-10-CM | POA: Diagnosis not present

## 2023-04-11 DIAGNOSIS — M25511 Pain in right shoulder: Secondary | ICD-10-CM | POA: Diagnosis not present

## 2023-04-11 DIAGNOSIS — R2681 Unsteadiness on feet: Secondary | ICD-10-CM | POA: Diagnosis not present

## 2023-04-11 DIAGNOSIS — M6281 Muscle weakness (generalized): Secondary | ICD-10-CM | POA: Diagnosis not present

## 2023-04-11 DIAGNOSIS — M6259 Muscle wasting and atrophy, not elsewhere classified, multiple sites: Secondary | ICD-10-CM | POA: Diagnosis not present

## 2023-04-12 ENCOUNTER — Emergency Department (HOSPITAL_COMMUNITY): Payer: Medicare Other

## 2023-04-12 ENCOUNTER — Encounter (HOSPITAL_COMMUNITY)
Admission: EM | Disposition: A | Discharge status: Skilled Nursing Facility | Payer: Self-pay | Source: Home / Self Care | Attending: Orthopedic Surgery

## 2023-04-12 ENCOUNTER — Emergency Department (HOSPITAL_COMMUNITY): Payer: Medicare Other | Admitting: Anesthesiology

## 2023-04-12 ENCOUNTER — Encounter: Payer: Self-pay | Admitting: Hematology

## 2023-04-12 ENCOUNTER — Encounter (HOSPITAL_COMMUNITY): Payer: Self-pay | Admitting: Emergency Medicine

## 2023-04-12 ENCOUNTER — Inpatient Hospital Stay (HOSPITAL_COMMUNITY)
Admission: EM | Admit: 2023-04-12 | Discharge: 2023-04-15 | DRG: 560 | Disposition: A | Discharge status: Skilled Nursing Facility | Payer: Medicare Other | Attending: Orthopedic Surgery | Admitting: Orthopedic Surgery

## 2023-04-12 DIAGNOSIS — Z888 Allergy status to other drugs, medicaments and biological substances status: Secondary | ICD-10-CM

## 2023-04-12 DIAGNOSIS — Z79899 Other long term (current) drug therapy: Secondary | ICD-10-CM

## 2023-04-12 DIAGNOSIS — M24452 Recurrent dislocation, left hip: Secondary | ICD-10-CM | POA: Diagnosis present

## 2023-04-12 DIAGNOSIS — Z881 Allergy status to other antibiotic agents status: Secondary | ICD-10-CM | POA: Diagnosis not present

## 2023-04-12 DIAGNOSIS — N182 Chronic kidney disease, stage 2 (mild): Secondary | ICD-10-CM | POA: Diagnosis present

## 2023-04-12 DIAGNOSIS — Z96653 Presence of artificial knee joint, bilateral: Secondary | ICD-10-CM | POA: Diagnosis present

## 2023-04-12 DIAGNOSIS — D631 Anemia in chronic kidney disease: Secondary | ICD-10-CM | POA: Diagnosis present

## 2023-04-12 DIAGNOSIS — I132 Hypertensive heart and chronic kidney disease with heart failure and with stage 5 chronic kidney disease, or end stage renal disease: Secondary | ICD-10-CM | POA: Diagnosis not present

## 2023-04-12 DIAGNOSIS — I110 Hypertensive heart disease with heart failure: Secondary | ICD-10-CM

## 2023-04-12 DIAGNOSIS — Z90710 Acquired absence of both cervix and uterus: Secondary | ICD-10-CM

## 2023-04-12 DIAGNOSIS — Z6821 Body mass index (BMI) 21.0-21.9, adult: Secondary | ICD-10-CM

## 2023-04-12 DIAGNOSIS — T84021A Dislocation of internal left hip prosthesis, initial encounter: Principal | ICD-10-CM | POA: Diagnosis present

## 2023-04-12 DIAGNOSIS — Z882 Allergy status to sulfonamides status: Secondary | ICD-10-CM

## 2023-04-12 DIAGNOSIS — Z823 Family history of stroke: Secondary | ICD-10-CM

## 2023-04-12 DIAGNOSIS — Z96642 Presence of left artificial hip joint: Secondary | ICD-10-CM | POA: Diagnosis not present

## 2023-04-12 DIAGNOSIS — I130 Hypertensive heart and chronic kidney disease with heart failure and stage 1 through stage 4 chronic kidney disease, or unspecified chronic kidney disease: Secondary | ICD-10-CM | POA: Diagnosis present

## 2023-04-12 DIAGNOSIS — Z85828 Personal history of other malignant neoplasm of skin: Secondary | ICD-10-CM

## 2023-04-12 DIAGNOSIS — X500XXA Overexertion from strenuous movement or load, initial encounter: Secondary | ICD-10-CM | POA: Diagnosis not present

## 2023-04-12 DIAGNOSIS — Z751 Person awaiting admission to adequate facility elsewhere: Secondary | ICD-10-CM

## 2023-04-12 DIAGNOSIS — Y792 Prosthetic and other implants, materials and accessory orthopedic devices associated with adverse incidents: Secondary | ICD-10-CM | POA: Diagnosis present

## 2023-04-12 DIAGNOSIS — D638 Anemia in other chronic diseases classified elsewhere: Secondary | ICD-10-CM | POA: Diagnosis not present

## 2023-04-12 DIAGNOSIS — Z66 Do not resuscitate: Secondary | ICD-10-CM | POA: Diagnosis present

## 2023-04-12 DIAGNOSIS — R2689 Other abnormalities of gait and mobility: Secondary | ICD-10-CM | POA: Diagnosis not present

## 2023-04-12 DIAGNOSIS — I10 Essential (primary) hypertension: Secondary | ICD-10-CM | POA: Diagnosis not present

## 2023-04-12 DIAGNOSIS — M519 Unspecified thoracic, thoracolumbar and lumbosacral intervertebral disc disorder: Secondary | ICD-10-CM | POA: Diagnosis not present

## 2023-04-12 DIAGNOSIS — S73005A Unspecified dislocation of left hip, initial encounter: Secondary | ICD-10-CM

## 2023-04-12 DIAGNOSIS — S73035A Other anterior dislocation of left hip, initial encounter: Principal | ICD-10-CM | POA: Diagnosis present

## 2023-04-12 DIAGNOSIS — I5032 Chronic diastolic (congestive) heart failure: Secondary | ICD-10-CM | POA: Diagnosis present

## 2023-04-12 DIAGNOSIS — Z87891 Personal history of nicotine dependence: Secondary | ICD-10-CM

## 2023-04-12 DIAGNOSIS — R5383 Other fatigue: Secondary | ICD-10-CM | POA: Diagnosis not present

## 2023-04-12 DIAGNOSIS — S73005D Unspecified dislocation of left hip, subsequent encounter: Secondary | ICD-10-CM

## 2023-04-12 DIAGNOSIS — R64 Cachexia: Secondary | ICD-10-CM | POA: Diagnosis present

## 2023-04-12 DIAGNOSIS — Z8249 Family history of ischemic heart disease and other diseases of the circulatory system: Secondary | ICD-10-CM | POA: Diagnosis not present

## 2023-04-12 DIAGNOSIS — Z4789 Encounter for other orthopedic aftercare: Secondary | ICD-10-CM | POA: Diagnosis not present

## 2023-04-12 DIAGNOSIS — H3530 Unspecified macular degeneration: Secondary | ICD-10-CM | POA: Diagnosis present

## 2023-04-12 DIAGNOSIS — R41841 Cognitive communication deficit: Secondary | ICD-10-CM | POA: Diagnosis not present

## 2023-04-12 DIAGNOSIS — Y92031 Bathroom in apartment as the place of occurrence of the external cause: Secondary | ICD-10-CM

## 2023-04-12 DIAGNOSIS — I509 Heart failure, unspecified: Secondary | ICD-10-CM | POA: Diagnosis not present

## 2023-04-12 DIAGNOSIS — I5022 Chronic systolic (congestive) heart failure: Secondary | ICD-10-CM | POA: Diagnosis not present

## 2023-04-12 DIAGNOSIS — R2681 Unsteadiness on feet: Secondary | ICD-10-CM | POA: Diagnosis not present

## 2023-04-12 DIAGNOSIS — Z7401 Bed confinement status: Secondary | ICD-10-CM | POA: Diagnosis not present

## 2023-04-12 DIAGNOSIS — N183 Chronic kidney disease, stage 3 unspecified: Secondary | ICD-10-CM | POA: Diagnosis not present

## 2023-04-12 DIAGNOSIS — S73035D Other anterior dislocation of left hip, subsequent encounter: Secondary | ICD-10-CM | POA: Diagnosis not present

## 2023-04-12 DIAGNOSIS — R300 Dysuria: Secondary | ICD-10-CM | POA: Diagnosis not present

## 2023-04-12 DIAGNOSIS — M25552 Pain in left hip: Secondary | ICD-10-CM | POA: Diagnosis not present

## 2023-04-12 DIAGNOSIS — M6281 Muscle weakness (generalized): Secondary | ICD-10-CM | POA: Diagnosis not present

## 2023-04-12 HISTORY — DX: Dyspnea, unspecified: R06.00

## 2023-04-12 HISTORY — PX: HIP CLOSED REDUCTION: SHX983

## 2023-04-12 LAB — CBC WITH DIFFERENTIAL/PLATELET
Abs Immature Granulocytes: 0.02 10*3/uL (ref 0.00–0.07)
Basophils Absolute: 0.1 10*3/uL (ref 0.0–0.1)
Basophils Relative: 2 %
Eosinophils Absolute: 0.9 10*3/uL — ABNORMAL HIGH (ref 0.0–0.5)
Eosinophils Relative: 12 %
HCT: 26.9 % — ABNORMAL LOW (ref 36.0–46.0)
Hemoglobin: 8.6 g/dL — ABNORMAL LOW (ref 12.0–15.0)
Immature Granulocytes: 0 %
Lymphocytes Relative: 15 %
Lymphs Abs: 1.1 10*3/uL (ref 0.7–4.0)
MCH: 27 pg (ref 26.0–34.0)
MCHC: 32 g/dL (ref 30.0–36.0)
MCV: 84.3 fL (ref 80.0–100.0)
Monocytes Absolute: 0.8 10*3/uL (ref 0.1–1.0)
Monocytes Relative: 11 %
Neutro Abs: 4.4 10*3/uL (ref 1.7–7.7)
Neutrophils Relative %: 60 %
Platelets: 366 10*3/uL (ref 150–400)
RBC: 3.19 MIL/uL — ABNORMAL LOW (ref 3.87–5.11)
RDW: 15.7 % — ABNORMAL HIGH (ref 11.5–15.5)
WBC: 7.4 10*3/uL (ref 4.0–10.5)
nRBC: 0 % (ref 0.0–0.2)

## 2023-04-12 LAB — BASIC METABOLIC PANEL
Anion gap: 11 (ref 5–15)
BUN: 24 mg/dL — ABNORMAL HIGH (ref 8–23)
CO2: 23 mmol/L (ref 22–32)
Calcium: 9 mg/dL (ref 8.9–10.3)
Chloride: 101 mmol/L (ref 98–111)
Creatinine, Ser: 0.81 mg/dL (ref 0.44–1.00)
GFR, Estimated: >60 mL/min (ref >60)
Glucose, Bld: 86 mg/dL (ref 70–99)
Potassium: 4.5 mmol/L (ref 3.5–5.1)
Sodium: 135 mmol/L (ref 135–145)

## 2023-04-12 LAB — NO BLOOD PRODUCTS

## 2023-04-12 SURGERY — CLOSED MANIPULATION, JOINT, HIP
Anesthesia: General | Site: Hip | Laterality: Left

## 2023-04-12 MED ORDER — DILTIAZEM HCL ER COATED BEADS 120 MG PO CP24
120.0000 mg | ORAL_CAPSULE | Freq: Every day | ORAL | Status: DC
  Filled 2023-04-12: qty 1

## 2023-04-12 MED ORDER — FENTANYL CITRATE PF 50 MCG/ML IJ SOSY
PREFILLED_SYRINGE | INTRAMUSCULAR | Status: AC
  Administered 2023-04-12: 25 ug via INTRAVENOUS
  Filled 2023-04-12: qty 1

## 2023-04-12 MED ORDER — FUROSEMIDE 20 MG PO TABS: 20.0000 mg | ORAL_TABLET | ORAL | Status: DC

## 2023-04-12 MED ORDER — FENTANYL CITRATE PF 50 MCG/ML IJ SOSY
50.0000 ug | PREFILLED_SYRINGE | Freq: Once | INTRAMUSCULAR | Status: AC
  Administered 2023-04-12: 50 ug via INTRAVENOUS
  Filled 2023-04-12: qty 1

## 2023-04-12 MED ORDER — LIDOCAINE HCL (CARDIAC) PF 100 MG/5ML IV SOSY
PREFILLED_SYRINGE | INTRAVENOUS | Status: DC | PRN
  Administered 2023-04-12: 60 mg via INTRAVENOUS

## 2023-04-12 MED ORDER — MUPIROCIN 2 % EX OINT
1.0000 | TOPICAL_OINTMENT | Freq: Two times a day (BID) | CUTANEOUS | Status: DC
  Administered 2023-04-13 – 2023-04-15 (×6): 1 via TOPICAL
  Filled 2023-04-12 (×2): qty 22

## 2023-04-12 MED ORDER — SUCCINYLCHOLINE CHLORIDE 200 MG/10ML IV SOSY
PREFILLED_SYRINGE | INTRAVENOUS | Status: DC | PRN
  Administered 2023-04-12: 30 mg via INTRAVENOUS
  Administered 2023-04-12: 80 mg via INTRAVENOUS

## 2023-04-12 MED ORDER — HYDRALAZINE HCL 20 MG/ML IJ SOLN
INTRAMUSCULAR | Status: AC
  Filled 2023-04-12: qty 1

## 2023-04-12 MED ORDER — HYDROMORPHONE HCL 1 MG/ML IJ SOLN
0.5000 mg | Freq: Once | INTRAMUSCULAR | Status: AC
  Administered 2023-04-12: 0.5 mg via INTRAVENOUS
  Filled 2023-04-12: qty 1

## 2023-04-12 MED ORDER — DOCUSATE SODIUM 100 MG PO CAPS
100.0000 mg | ORAL_CAPSULE | Freq: Two times a day (BID) | ORAL | Status: DC
  Administered 2023-04-13 – 2023-04-15 (×6): 100 mg via ORAL
  Filled 2023-04-12 (×5): qty 1

## 2023-04-12 MED ORDER — COLCHICINE 0.6 MG PO TABS
0.6000 mg | ORAL_TABLET | ORAL | Status: DC
  Administered 2023-04-13 – 2023-04-15 (×2): 0.6 mg via ORAL
  Filled 2023-04-12 (×2): qty 1

## 2023-04-12 MED ORDER — PROPOFOL 10 MG/ML IV BOLUS
0.5000 mg/kg | Freq: Once | INTRAVENOUS | Status: AC
  Administered 2023-04-12: 88 mg via INTRAVENOUS
  Filled 2023-04-12: qty 20

## 2023-04-12 MED ORDER — FERROUS SULFATE 325 (65 FE) MG PO TABS
650.0000 mg | ORAL_TABLET | Freq: Every day | ORAL | Status: DC
  Administered 2023-04-13 – 2023-04-15 (×3): 650 mg via ORAL
  Filled 2023-04-12 (×3): qty 2

## 2023-04-12 MED ORDER — CHLORHEXIDINE GLUCONATE 0.12 % MT SOLN
15.0000 mL | Freq: Once | OROMUCOSAL | Status: AC
  Administered 2023-04-12: 15 mL via OROMUCOSAL

## 2023-04-12 MED ORDER — ALLOPURINOL 300 MG PO TABS
300.0000 mg | ORAL_TABLET | Freq: Every day | ORAL | Status: DC
  Administered 2023-04-13 – 2023-04-15 (×4): 300 mg via ORAL
  Filled 2023-04-12 (×4): qty 1

## 2023-04-12 MED ORDER — LOSARTAN POTASSIUM 25 MG PO TABS
25.0000 mg | ORAL_TABLET | Freq: Every day | ORAL | Status: DC
  Administered 2023-04-13: 25 mg via ORAL
  Filled 2023-04-12: qty 1

## 2023-04-12 MED ORDER — ACETAMINOPHEN 10 MG/ML IV SOLN
1000.0000 mg | Freq: Once | INTRAVENOUS | Status: AC
Start: 2023-04-12 — End: 2023-04-12
  Administered 2023-04-12: 1000 mg via INTRAVENOUS

## 2023-04-12 MED ORDER — LACTATED RINGERS IV SOLN: INTRAVENOUS | Status: DC

## 2023-04-12 MED ORDER — HYDRALAZINE HCL 20 MG/ML IJ SOLN
5.0000 mg | Freq: Once | INTRAMUSCULAR | Status: AC | PRN
  Administered 2023-04-12: 5 mg via INTRAVENOUS

## 2023-04-12 MED ORDER — ACETAMINOPHEN 10 MG/ML IV SOLN
INTRAVENOUS | Status: AC
  Filled 2023-04-12: qty 100

## 2023-04-12 MED ORDER — HYDROCODONE-ACETAMINOPHEN 5-325 MG PO TABS
1.0000 | ORAL_TABLET | ORAL | Status: DC | PRN
  Administered 2023-04-13: 1 via ORAL
  Administered 2023-04-13: 2 via ORAL
  Administered 2023-04-15: 1 via ORAL
  Filled 2023-04-12: qty 1
  Filled 2023-04-12: qty 2
  Filled 2023-04-12: qty 1

## 2023-04-12 MED ORDER — ENOXAPARIN SODIUM 40 MG/0.4ML IJ SOSY: 40.0000 mg | PREFILLED_SYRINGE | INTRAMUSCULAR | Status: DC

## 2023-04-12 MED ORDER — ONDANSETRON HCL 4 MG/2ML IJ SOLN
INTRAMUSCULAR | Status: DC | PRN
  Administered 2023-04-12: 4 mg via INTRAVENOUS

## 2023-04-12 MED ORDER — HYDROMORPHONE HCL 1 MG/ML IJ SOLN
0.5000 mg | INTRAMUSCULAR | Status: DC | PRN
  Administered 2023-04-12: 0.5 mg via INTRAVENOUS
  Filled 2023-04-12: qty 1

## 2023-04-12 MED ORDER — TRIAMCINOLONE ACETONIDE 0.1 % EX CREA
TOPICAL_CREAM | Freq: Three times a day (TID) | CUTANEOUS | Status: DC
  Administered 2023-04-14: 1 via TOPICAL
  Filled 2023-04-12: qty 15

## 2023-04-12 MED ORDER — ACETAMINOPHEN 325 MG PO TABS: 325.0000 mg | ORAL_TABLET | Freq: Four times a day (QID) | ORAL | Status: DC | PRN

## 2023-04-12 MED ORDER — CARVEDILOL 3.125 MG PO TABS
3.1250 mg | ORAL_TABLET | Freq: Two times a day (BID) | ORAL | Status: DC
  Filled 2023-04-12: qty 1

## 2023-04-12 MED ORDER — ONDANSETRON HCL 4 MG/2ML IJ SOLN
4.0000 mg | Freq: Four times a day (QID) | INTRAMUSCULAR | Status: DC | PRN
Start: 2023-04-12 — End: 2023-04-15

## 2023-04-12 MED ORDER — POVIDONE-IODINE 10 % EX SWAB: 2.0000 | Freq: Once | CUTANEOUS | Status: DC

## 2023-04-12 MED ORDER — FENTANYL CITRATE PF 50 MCG/ML IJ SOSY
25.0000 ug | PREFILLED_SYRINGE | INTRAMUSCULAR | Status: DC | PRN
  Administered 2023-04-12: 25 ug via INTRAVENOUS

## 2023-04-12 MED ORDER — MORPHINE SULFATE (PF) 2 MG/ML IV SOLN: 0.5000 mg | INTRAVENOUS | Status: DC | PRN

## 2023-04-12 MED ORDER — MORPHINE SULFATE (PF) 4 MG/ML IV SOLN
4.0000 mg | Freq: Once | INTRAVENOUS | Status: AC
  Administered 2023-04-12: 4 mg via INTRAVENOUS
  Filled 2023-04-12: qty 1

## 2023-04-12 MED ORDER — ONDANSETRON HCL 4 MG PO TABS: 4.0000 mg | ORAL_TABLET | Freq: Four times a day (QID) | ORAL | Status: DC | PRN

## 2023-04-12 MED ORDER — ONDANSETRON HCL 4 MG/2ML IJ SOLN
4.0000 mg | Freq: Once | INTRAMUSCULAR | Status: AC
  Administered 2023-04-12: 4 mg via INTRAVENOUS
  Filled 2023-04-12: qty 2

## 2023-04-12 MED ORDER — PROPOFOL 500 MG/50ML IV EMUL
INTRAVENOUS | Status: DC | PRN
  Administered 2023-04-12: 100 mg via INTRAVENOUS

## 2023-04-12 NOTE — Progress Notes (Signed)
Patient remains in Phase 1 Recovery awaiting order from surgeon for hospitalist consult for inpatient orders.

## 2023-04-12 NOTE — ED Triage Notes (Signed)
Pt is from [facility]

## 2023-04-12 NOTE — ED Provider Notes (Signed)
[place] EMERGENCY DEPARTMENT AT [facility] Provider Note   CSN: [id number] Arrival date & time: 04/12/23  0913     History  Chief Complaint  Patient presents with   Hip Pain    [name] is a 89 y.o. female.  She has multiple comorbid medical conditions.  She is brought in by ambulance from her facility after she felt her left hip dislocate.  She said she was trying to get up off of the toilet.  She has a prior history of a hip dislocation.  She has not eaten or drank anything today.  She denies any chest pain or shortness of breath.  She does endorse significant hip pain.  No numbness.  The history is provided by the patient.  Hip Pain This is a recurrent problem. The current episode started 1 to 2 hours ago. The problem occurs constantly. The problem has not changed since onset.Pertinent negatives include no chest pain, no abdominal pain, no headaches and no shortness of breath. The symptoms are aggravated by bending. Nothing relieves the symptoms. She has tried rest for the symptoms. The treatment provided no relief.       Home Medications Prior to Admission medications   Medication Sig Start Date End Date Taking? Authorizing Provider  allopurinol (ZYLOPRIM) 100 MG tablet Take 50 mg by mouth daily.    [provider]  amoxicillin (AMOXIL) 500 MG capsule Take 2,000 mg by mouth See admin instructions. Take 2,000 mg by mouth one hour prior to dental procedures    [provider]  B Complex Vitamins (VITAMIN B COMPLEX) TABS Take 1 tablet by mouth daily with breakfast.    [provider]  carvedilol (COREG) 12.5 MG tablet Take 12.5 mg by mouth 2 (two) times daily with a meal.    [provider]  Cholecalciferol (VITAMIN D3) 25 MCG (1000 UT) CAPS Take 1,000 Units by mouth daily.    [provider]  diltiazem (CARDIZEM CD) 240 MG 24 hr capsule Take 1 capsule (240 mg total) by mouth daily. 01/25/23   Tereso Newcomer T, PA-C   doxycycline (VIBRA-TABS) 100 MG tablet Take 1 tablet (100 mg total) by mouth 2 (two) times daily. 03/29/23   Delories Heinz, DPM  FEROSUL 325 (65 Fe) MG tablet Take 650 mg by mouth daily with breakfast.    [provider]  furosemide (LASIX) 20 MG tablet Take 1 tablet (20 mg total) by mouth every other day. 04/11/22 04/11/23  Pokhrel, Rebekah Chesterfield, MD  hydrocerin (EUCERIN) CREA Apply 1 Application topically 2 (two) times daily. Apply to intact skin of bilateral legs and feet twice daily. DO NOT PLACE IN BETWEEN TOES. 12/10/22   Rodolph Bong, MD  hydrOXYzine (ATARAX) 25 MG tablet Take 1 tablet (25 mg total) by mouth every 4 (four) hours as needed for itching. 12/30/22   Arby Barrette, MD  loratadine (CLARITIN) 10 MG tablet Take 10 mg by mouth 2 (two) times daily.    [provider]  melatonin 3 MG TABS tablet Take 1 tablet (3 mg total) by mouth at bedtime as needed (insomnia). 12/10/22   Rodolph Bong, MD  MITIGARE 0.6 MG CAPS Take 0.6 mg by mouth every other day.    [provider]  Multiple Vitamins-Minerals (SYSTANE ICAPS AREDS2 PO) Take 1 capsule by mouth in the morning and at bedtime.    [provider]  mupirocin ointment (BACTROBAN) 2 % Apply 1 Application topically 2 (two) times daily. 03/29/23  Delories Heinz, DPM  predniSONE (DELTASONE) 20 MG tablet 2 tabs po daily x 4 days 12/30/22   Arby Barrette, MD  sodium chloride 1 g tablet Take 1 g by mouth daily. 08/21/19   [provider]  triamcinolone cream (KENALOG) 0.1 % Apply topically 3 (three) times daily. To areas of psoariatic rash. Do not apply to face. 10/25/22   Johney Maine, MD  TYLENOL 500 MG tablet Take 500-1,000 mg by mouth every 6 (six) hours as needed for mild pain or headache.    [provider]      Allergies    Cephalexin, Shellfish-derived products, Clindamycin/lincomycin, Lincomycin, Clindamycin hcl, Fosfomycin, Levofloxacin, Penicillamine,  Sugar-protein-starch, Tape, Ciprofloxacin, Sulfa antibiotics, and Sulfites    Review of Systems   Review of Systems  Constitutional:  Negative for fever.  Respiratory:  Negative for shortness of breath.   Cardiovascular:  Negative for chest pain.  Gastrointestinal:  Negative for abdominal pain.  Neurological:  Negative for headaches.    Physical Exam Updated Vital Signs BP (!) 178/78 (BP Location: Left Arm)   Pulse 79   Temp 98.2 F (36.8 C) (Oral)   Resp 18   SpO2 99%  Physical Exam Vitals and nursing note reviewed.  Constitutional:      General: She is not in acute distress.    Appearance: Normal appearance. She is well-developed.  HENT:     Head: Normocephalic and atraumatic.  Eyes:     Conjunctiva/sclera: Conjunctivae normal.  Cardiovascular:     Rate and Rhythm: Normal rate and regular rhythm.     Heart sounds: No murmur heard. Pulmonary:     Effort: Pulmonary effort is normal. No respiratory distress.     Breath sounds: Normal breath sounds.  Abdominal:     Palpations: Abdomen is soft.     Tenderness: There is no abdominal tenderness.  Musculoskeletal:        General: Tenderness and deformity present.     Cervical back: Neck supple.     Comments: Patient's left leg is shortened and flexed internally rotated.  She has multiple excoriations of her lower extremity skin.  Distal pulses intact.  Skin:    General: Skin is warm and dry.     Capillary Refill: Capillary refill takes less than 2 seconds.  Neurological:     General: No focal deficit present.     Mental Status: She is alert.     Motor: No weakness.     ED Results / Procedures / Treatments   Labs (all labs ordered are listed, but only abnormal results are displayed) Labs Reviewed  BASIC METABOLIC PANEL - Abnormal; Notable for the following components:      Result Value   BUN 24 (*)    All other components within normal limits  CBC WITH DIFFERENTIAL/PLATELET - Abnormal; Notable for the following  components:   RBC 3.19 (*)    Hemoglobin 8.6 (*)    HCT 26.9 (*)    RDW 15.7 (*)    Eosinophils Absolute 0.9 (*)    All other components within normal limits  NO BLOOD PRODUCTS    EKG EKG Interpretation Date/Time:  Tuesday April 12 2023 11:07:45 EST Ventricular Rate:  99 PR Interval:  175 QRS Duration:  84 QT Interval:  325 QTC Calculation: 417 R Axis:   73  Text Interpretation: Sinus rhythm Biatrial enlargement Anterior infarct, old Baseline wander in lead(s) V1 COPY Confirmed by Meridee Score [id number]) on 04/12/2023 11:10:22 AM  Radiology DG  Hip Port Unilat W or [place] Pelvis 1 View Left Result Date: 04/12/2023 CLINICAL DATA:  Status post attempted reduction. EXAM: DG HIP (WITH OR WITHOUT PELVIS) 1V PORT LEFT COMPARISON:  Same day. FINDINGS: There is continued superior dislocation of left hip arthroplasty. IMPRESSION: Continued superior dislocation of left hip arthroplasty. Electronically Signed   By: Lupita Raider M.D.   On: 04/12/2023 12:50   DG Hip Unilat W or Wo Pelvis 2-3 Views Left Result Date: 04/12/2023 CLINICAL DATA:  Left hip dislocation EXAM: DG HIP (WITH OR WITHOUT PELVIS) 2-3V LEFT COMPARISON:  12/30/2022 FINDINGS: Status post left total hip arthroplasty. Superior dislocation of the femoral component relative to the acetabular cup. No periprosthetic fracture is identified. IMPRESSION: Superior dislocation of the left total hip arthroplasty. Electronically Signed   By: Duanne Guess D.O.   On: 04/12/2023 11:17    Procedures .Sedation  Date/Time: 04/12/2023 11:31 AM  Performed by: Terrilee Files, MD Authorized by: Terrilee Files, MD   Consent:    Consent obtained:  Verbal and written   Consent given by:  Patient   Risks discussed:  Allergic reaction, dysrhythmia, inadequate sedation, nausea, prolonged hypoxia resulting in organ damage, prolonged sedation necessitating reversal, respiratory compromise necessitating ventilatory assistance and intubation and  vomiting   Alternatives discussed:  Analgesia without sedation, anxiolysis and regional anesthesia Universal protocol:    Procedure explained and questions answered to patient or proxy's satisfaction: yes     Relevant documents present and verified: yes     Test results available: yes     Imaging studies available: yes     Required blood products, implants, devices, and special equipment available: yes     Site/side marked: yes     Immediately prior to procedure, a time out was called: yes     Patient identity confirmed:  Verbally with patient Indications:    Procedure performed:  Dislocation reduction   Procedure necessitating sedation performed by:  Physician performing sedation Pre-sedation assessment:    Time since last food or drink:  4   ASA classification: class 3 - patient with severe systemic disease     Mouth opening:  3 or more finger widths   Thyromental distance:  4 finger widths   Mallampati score:  IV - only hard palate visible   Neck mobility: normal     Pre-sedation assessments completed and reviewed: airway patency, cardiovascular function, hydration status, mental status, nausea/vomiting, pain level, respiratory function and temperature   A pre-sedation assessment was completed prior to the start of the procedure Immediate pre-procedure details:    Reassessment: Patient reassessed immediately prior to procedure     Reviewed: vital signs, relevant labs/tests and NPO status     Verified: bag valve mask available, emergency equipment available, intubation equipment available, IV patency confirmed, oxygen available and suction available   Procedure details (see MAR for exact dosages):    Preoxygenation:  Nasal cannula   Sedation:  Propofol   Intended level of sedation: deep   Intra-procedure monitoring:  Blood pressure monitoring, cardiac monitor, continuous pulse oximetry, frequent LOC assessments, frequent vital sign checks and continuous capnometry   Intra-procedure  events: none     Total Provider sedation time (minutes):  15 Post-procedure details:   A post-sedation assessment was completed following the completion of the procedure.   Attendance: Constant attendance by certified staff until patient recovered     Recovery: Patient returned to pre-procedure baseline     Post-sedation assessments completed and reviewed: airway  patency, cardiovascular function, hydration status, mental status, nausea/vomiting, pain level, respiratory function and temperature     Patient is stable for discharge or admission: yes     Procedure completion:  Tolerated well, no immediate complications .Reduction of dislocation  Date/Time: 04/12/2023 11:32 AM  Performed by: Terrilee Files, MD Authorized by: Terrilee Files, MD  Consent: Written consent obtained. Risks and benefits: risks, benefits and alternatives were discussed Consent given by: patient Patient understanding: patient states understanding of the procedure being performed Patient consent: the patient's understanding of the procedure matches consent given Procedure consent: procedure consent matches procedure scheduled Relevant documents: relevant documents present and verified Imaging studies: imaging studies available Required items: required blood products, implants, devices, and special equipment available Patient identity confirmed: arm band Time out: Immediately prior to procedure a "time out" was called to verify the correct patient, procedure, equipment, support staff and site/side marked as required. Local anesthesia used: no  Anesthesia: Local anesthesia used: no  Sedation: Patient sedated: yes Sedatives: see MAR for details Analgesia: see MAR for details Vitals: Vital signs were monitored during sedation.  Patient tolerance: patient tolerated the procedure well with no immediate complications Comments: Unsuccessful reduction left hip       Medications Ordered in ED Medications   fentaNYL (SUBLIMAZE) injection 50 mcg (has no administration in time range)  propofol (DIPRIVAN) 10 mg/mL bolus/IV push 0.5 mg/kg (has no administration in time range)    ED Course/ Medical Decision Making/ A&P Clinical Course as of 04/12/23 1659  Tue Apr 12, 2023  1037 Left hip x-ray showing a prosthetic dislocation.  Asked nurse to see if we can get her room for attempted reduction and sedation. [MB]  1243 Patient was sedated and had an unsuccessful closed reduction of her hip.  She tolerated procedure well although was requiring more pain medication.  I have placed a call to orthopedics for further recommendations. [MB]    Clinical Course User Index [MB] Terrilee Files, MD                                 Medical Decision Making Amount and/or Complexity of Data Reviewed Labs: ordered. Radiology: ordered.  Risk Prescription drug management.   This patient complains of acute left hip pain; this involves an extensive number of treatment Options and is a complaint that carries with it a high risk of complications and morbidity. The differential includes dislocation, fracture, contusion  I ordered, reviewed and interpreted labs, which included CBC with normal white count, low hemoglobin down from priors, chemistries unremarkable I ordered medication IV pain medication nausea medication, medication for sedation and reviewed PMP when indicated. I ordered imaging studies which included left hip x-ray and I independently    visualized and interpreted imaging which showed a superiorly dislocated prosthetic hip Previous records obtained and reviewed in epic, patient had a failed ED hip relocation attempt back in September and needed to go to the OR I consulted orthopedics Dr. Victorino Dike and discussed lab and imaging findings and discussed disposition.  Cardiac monitoring reviewed, sinus rhythm Social determinants considered, no significant barriers Critical Interventions: None  After  the interventions stated above, I reevaluated the patient and found patient remains dislocated despite attempt at reduction Admission and further testing considered, appreciate orthopedics involvement who are taken to the operating room for attempted closed reduction under anesthesia.  I did reach out to patient's daughter-in-law and updated her on  patient's status.         Final Clinical Impression(s) / ED Diagnoses Final diagnoses:  Closed anterior dislocation of left hip, initial encounter [facility])    Rx / DC Orders ED Discharge Orders     None         Terrilee Files, MD 04/12/23 1702

## 2023-04-12 NOTE — Progress Notes (Addendum)
Verbal orders received from Dr. Victorino Dike to place an order for patient to bed admitted for observation and diet. MD states that he will be able to place orders for a hospital to consult and admit her.

## 2023-04-12 NOTE — Anesthesia Postprocedure Evaluation (Signed)
Anesthesia Post Note  Patient: [name]  Procedure(s) Performed: CLOSED MANIPULATION HIP (Left: Hip)     Patient location during evaluation: PACU Anesthesia Type: General Level of consciousness: awake and alert Pain management: pain level controlled Vital Signs Assessment: post-procedure vital signs reviewed and stable Respiratory status: spontaneous breathing, nonlabored ventilation, respiratory function stable and patient connected to nasal cannula oxygen Cardiovascular status: blood pressure returned to baseline and stable Postop Assessment: no apparent nausea or vomiting Anesthetic complications: no   No notable events documented.  Last Vitals:  Vitals:   04/12/23 1532 04/12/23 1718  BP:  (!) 194/90  Pulse:  85  Resp:  15  Temp:    SpO2: 95% 100%    Last Pain:  Vitals:   04/12/23 1448  TempSrc: Oral  PainSc: 10-Worst pain ever                 Collene Schlichter

## 2023-04-12 NOTE — Op Note (Signed)
04/12/2023  5:14 PM  PATIENT:  [name]  89 y.o. female  PRE-OPERATIVE DIAGNOSIS:  left hip prosthetic dislocation  POST-OPERATIVE DIAGNOSIS:  same  Procedure(s):  closed reduction left hip dislocation under general anesthesia  SURGEON:  Toni Arthurs, MD  ASSISTANT: none  ANESTHESIA:   General  EBL:  none   TOURNIQUET:  none  COMPLICATIONS:  None apparent  DISPOSITION:  Extubated, awake and stable to recovery.  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  She presents now for closed reduction under general anesthesia after attempted reduction under IV sedation was unsuccessful in the emergency room.  She understands the risks and benefits of the alternative treatment options and elects to proceed.  PROCEDURE IN DETAIL: After preoperative consent was obtained, the patient was brought to the operating room supine on the stretcher.  General anesthesia was administered, and a surgical timeout was taken.  The left hip was flexed to 90 degrees and maximally adducted.  Longitudinal traction was applied to the flexed knee.  Several attempts were made before satisfactory reduction.  An AP radiograph was repeated confirming concentric reduction of the hip joint.  There was no apparent injury to the acetabulum or proximal femur.  The left lower extremity was symmetric in length and rotation, and pulses were palpable in the foot.  The patient was awakened from anesthesia and transported to the recovery room in stable condition.  FOLLOW UP PLAN: Weightbearing as tolerated on the left lower extremity in a knee immobilizer.  Follow-up in 2 weeks.  Safe for discharge back to skilled nursing facility.

## 2023-04-12 NOTE — Progress Notes (Addendum)
Patient's son and POA, [name], called to speak with this RN stating that he was not aware that his mother was in the hospital or the reason for her being in the hospital. Thurmond Butts states that he does not want the patient to discharged to her senior living facility due to fear of her safety while she is alone.

## 2023-04-12 NOTE — Transfer of Care (Signed)
Immediate Anesthesia Transfer of Care Note  Patient: [name]  Procedure(s) Performed: Procedure(s): CLOSED MANIPULATION HIP (Left)  Patient Location: PACU  Anesthesia Type:General  Level of Consciousness:  sedated, patient cooperative and responds to stimulation  Airway & Oxygen Therapy:Patient Spontanous Breathing and Patient connected to face mask oxgen  Post-op Assessment:  Report given to PACU RN and Post -op Vital signs reviewed and stable  Post vital signs:  Reviewed and stable  Last Vitals:  Vitals:   04/12/23 1448 04/12/23 1532  BP: (!) 178/89   Pulse: 93   Resp: 16   Temp: (!) 36.4 C   SpO2: 92% 95%    Complications: No apparent anesthesia complications

## 2023-04-12 NOTE — ED Notes (Signed)
Need room   Terrilee Files, MD 04/12/23 1255

## 2023-04-12 NOTE — Consult Note (Signed)
Consult Note:   [name] ZDG:[id number] DOB: 03-Jul-1929 DOA: 04/12/2023 PCP: Emilio Aspen, MD  Requesting physician: Dr. Jonny Ruiz [facility] Reason for consultation: Medical management   History of Present Illness:   [name] is an 89 y.o. female past medical history of left hip dislocation, allergies to 13 medications which includes Keflex shellfish clindamycin vancomycin fosfomycin and Levaquin penicillamine take sulfa antibiotics please refer to complete list, anemia, CKD stage II, GERD, myocarditis, thoracic aneurysm, presenting today for left hip dislocation and pain while trying to get out of off the commode similar to her previous state presentation.  Patient lives in [place]. Per ED note unsuccessful reduction of the left hip in the emergency room after sedation.  And orthopedics was consulted for same.  Patient was taken to the OR and her left hip was reduced patient was in recovery and hospitalist consult was called for her multiple medical problems.  Review of Systems:   Review of Systems  Musculoskeletal:  Positive for joint pain.   Allergies:   Allergies  Allergen Reactions   Cephalexin Other (See Comments)    Pt states this caused eye problems and STROKE-LIKE SYMPTOMS!!   Shellfish-Derived Products Hives and Rash   Clindamycin/Lincomycin Rash   Lincomycin Rash   Clindamycin Hcl Other (See Comments)    "Per pharmacist"   Fosfomycin Other (See Comments)    Disorientation   Levofloxacin Other (See Comments)    Reaction not recalled by the patient   Penicillamine Other (See Comments)    Reaction not recalled by the patient   Sugar-Protein-Starch Other (See Comments)    Sugary foods cause legs to cramp   Tape Other (See Comments)    Please use paper tape or Coban Wrap; patient's skin tears VERY easily!!   Ciprofloxacin Rash   Sulfa Antibiotics Hives and Rash   Sulfites Hives, Swelling and Rash    Past Medical History:      Past Medical History:  Diagnosis Date   Anemia    CHRONIC   Aortic insufficiency    a. 2D echo 08/29/15: EF 55-60%, mild LVH, diastolic dysfunction, elevated LV filling pressure, mild AI, severe LAE, mild RAE, mild TR, dilated descending thoracic aorta just distal to the takeoff of the left subclavian, measuring 4.1 cm, no pericardial effusion.   Arthritis    OA-SOME BACK AND NECK PAIN,  GOES TO CHIROPRACTOR TWICE A MONTH;  HX OF JOINT REPLACEMENTS    Cancer (HCC)    SKIN CANCERS REMOVED FROM LEGS   CKD (chronic kidney disease), stage II    Depression    Dyspnea    Essential hypertension    Gait abnormality 09/08/2017   GERD (gastroesophageal reflux disease)    Low sodium levels    Myocarditis (HCC)    PSVT (paroxysmal supraventricular tachycardia) (HCC)    Recurrent Pericarditis    RECURRENT    Thoracic aneurysm    a. Followed by [facility] -last seen in 12/2013 w/ plan for f/u MRA and thoracic surgery f/u in 2 yrs, but patient does not wish to follow any longer.    Past Surgical History:   Past Surgical History:  Procedure Laterality Date   APPENDECTOMY     BREAST EXCISIONAL BIOPSY Left 1992   benign   EXCISION/RELEASE BURSA HIP Left 11/24/2012   Procedure: LEFT HIP BURSECTOMY AND TENDON REPAIR ;  Surgeon: Loanne Drilling, MD;  Location: [facility];  Service: Orthopedics;  Laterality: Left;   HIP CLOSED REDUCTION  Left 12/19/2012   Procedure: CLOSED MANIPULATION HIP;  Surgeon: Shelda Pal, MD;  Location: [facility];  Service: Orthopedics;  Laterality: Left;   HIP CLOSED REDUCTION Left 12/05/2013   Procedure: CLOSED MANIPULATION HIP;  Surgeon: Verlee Rossetti, MD;  Location: [facility];  Service: Orthopedics;  Laterality: Left;   HIP CLOSED REDUCTION Left 09/22/2015   Procedure: CLOSED REDUCTION HIP;  Surgeon: Venita Lick, MD;  Location: [facility];  Service: Orthopedics;  Laterality: Left;   HIP CLOSED REDUCTION Left 12/05/2022   Procedure: CLOSED MANIPULATION HIP;  Surgeon: Ollen Gross, MD;   Location: [facility];  Service: Orthopedics;  Laterality: Left;   JOINT REPLACEMENT     LEFT TOTAL HIP REPLACEMENT AND REVISIONS X 2   OOPHORECTOMY     has a partial of one ovary remaingin   PELVIC LAPAROSCOPY     ovarian cyst removal,    REVISION TOTAL HIP ARTHROPLASTY     left   right Achilles Tendon repair  5/13   TOTAL KNEE ARTHROPLASTY     right   TOTAL KNEE ARTHROPLASTY     left   VAGINAL HYSTERECTOMY     with ovarian cyst removal    Current Medications:   Scheduled Meds:  allopurinol  300 mg Oral Daily   carvedilol  3.125 mg Oral BID WC   colchicine  0.6 mg Oral QODAY   diltiazem  120 mg Oral Daily   docusate sodium  100 mg Oral BID   enoxaparin (LOVENOX) injection  40 mg Subcutaneous Q24H   ferrous sulfate  650 mg Oral Q breakfast   mupirocin ointment  1 Application Topical BID   triamcinolone cream   Topical TID   Continuous Infusions:  acetaminophen     PRN Meds:.acetaminophen, acetaminophen, HYDROcodone-acetaminophen, morphine injection, ondansetron **OR** ondansetron (ZOFRAN) IV  Social History:    reports that she quit smoking about 60 years ago. Her smoking use included cigarettes. She started smoking about 65 years ago. She has never used smokeless tobacco. She reports that she does not currently use drugs. She reports that she does not drink alcohol.  Physical Exam:   Vitals:   04/12/23 2030 04/12/23 2045 04/12/23 2100 04/12/23 2212  BP: 122/73 (!) 130/90 112/63 132/75  Pulse: 81 91 82 79  Temp:   97.9 F (36.6 C) (!) 97.5 F (36.4 C)  Resp: 11 14 11 18   Height:      Weight:      SpO2: 100% 97% 100% 100%  TempSrc:    Oral  BMI (Calculated):        Physical Exam Vitals and nursing note reviewed.  Constitutional:      General: She is not in acute distress.    Appearance: She is not ill-appearing.  HENT:     Head: Normocephalic and atraumatic.     Right Ear: Hearing normal.     Left Ear: Hearing normal.     Nose: Nose normal. No nasal  deformity.     Mouth/Throat:     Lips: Pink.     Tongue: No lesions.  Eyes:     General: Lids are normal.     Extraocular Movements: Extraocular movements intact.  Cardiovascular:     Rate and Rhythm: Normal rate and regular rhythm.     Heart sounds: Normal heart sounds.  Pulmonary:     Effort: Pulmonary effort is normal.     Breath sounds: Normal breath sounds.  Abdominal:     General: Bowel sounds are normal.  There is no distension.     Palpations: Abdomen is soft. There is no mass.     Tenderness: There is no abdominal tenderness.  Musculoskeletal:     Right lower leg: No edema.     Left lower leg: No edema.  Skin:    General: Skin is warm.  Neurological:     General: No focal deficit present.     Mental Status: She is alert and oriented to person, place, and time.     Cranial Nerves: Cranial nerves 2-12 are intact.  Psychiatric:        Speech: Speech normal.    Data Review:   Labs:  Basic Metabolic Panel: Recent Labs  Lab 04/12/23 1024  NA 135  K 4.5  CL 101  CO2 23  GLUCOSE 86  BUN 24*  CREATININE 0.81  CALCIUM 9.0   GFR Estimated Creatinine Clearance: 39.1 mL/min (by C-G formula based on SCr of 0.81 mg/dL). Liver Function Tests: No results for input(s): "AST", "ALT", "ALKPHOS", "BILITOT", "PROT", "ALBUMIN" in the last 168 hours. No results for input(s): "LIPASE", "AMYLASE" in the last 168 hours. No results for input(s): "AMMONIA" in the last 168 hours. Coagulation profile No results for input(s): "INR", "PROTIME" in the last 168 hours.  CBC: Recent Labs  Lab 04/12/23 1024  WBC 7.4  NEUTROABS 4.4  HGB 8.6*  HCT 26.9*  MCV 84.3  PLT 366   Urinalysis    Component Value Date/Time   COLORURINE YELLOW 12/30/2022 1736   APPEARANCEUR CLEAR 12/30/2022 1736   LABSPEC 1.012 12/30/2022 1736   PHURINE 6.0 12/30/2022 1736   GLUCOSEU NEGATIVE 12/30/2022 1736   HGBUR NEGATIVE 12/30/2022 1736   BILIRUBINUR NEGATIVE 12/30/2022 1736   KETONESUR NEGATIVE  12/30/2022 1736   PROTEINUR NEGATIVE 12/30/2022 1736   UROBILINOGEN 0.2 04/17/2021 1314   NITRITE NEGATIVE 12/30/2022 1736   LEUKOCYTESUR TRACE (A) 12/30/2022 1736   Sepsis Labs Recent Labs  Lab 04/12/23 1024  WBC 7.4   Microbiology No results found for this or any previous visit (from the past 240 hours).   Radiological Exams: DG Pelvis Portable Result Date: 04/12/2023 CLINICAL DATA:  Post reduction of dislocated left total hip arthroplasty. EXAM: PORTABLE PELVIS 1-2 VIEWS COMPARISON:  Radiographs earlier the same date. FINDINGS: 1705 hours. Single AP view of the left hip demonstrates normal alignment of the total hip arthroplasty in this single projection. No evidence of acute fracture. The distal aspect of the femoral prosthesis is not imaged. IMPRESSION: Interval reduction of dislocated left total hip arthroplasty. Electronically Signed   By: Carey Bullocks M.D.   On: 04/12/2023 17:20   DG Hip Port Unilat W or Wo Pelvis 1 View Left Result Date: 04/12/2023 CLINICAL DATA:  Status post attempted reduction. EXAM: DG HIP (WITH OR WITHOUT PELVIS) 1V PORT LEFT COMPARISON:  Same day. FINDINGS: There is continued superior dislocation of left hip arthroplasty. IMPRESSION: Continued superior dislocation of left hip arthroplasty. Electronically Signed   By: Lupita Raider M.D.   On: 04/12/2023 12:50   DG Hip Unilat W or Wo Pelvis 2-3 Views Left Result Date: 04/12/2023 CLINICAL DATA:  Left hip dislocation EXAM: DG HIP (WITH OR WITHOUT PELVIS) 2-3V LEFT COMPARISON:  12/30/2022 FINDINGS: Status post left total hip arthroplasty. Superior dislocation of the femoral component relative to the acetabular cup. No periprosthetic fracture is identified. IMPRESSION: Superior dislocation of the left total hip arthroplasty. Electronically Signed   By: Duanne Guess D.O.   On: 04/12/2023 11:17   Assessment &  Plan Closed anterior dislocation of left hip (HCC) Status post reduction in the OR today. Pain  management as needed IV as needed. Physical therapy as needed. Chronic heart failure with preserved ejection fraction (HFpEF) (HCC) Currently patient is euvolemic.  Will hold Lasix as her blood pressures low normal.  Obtain orthostatics prior to discharge and resume home meds as deemed appropriate.  We will hold patient's Lasix / losartan and will continue diltiazem and Coreg. Essential hypertension Vitals:   04/12/23 1830 04/12/23 1845 04/12/23 1900 04/12/23 1915  BP: (!) 154/75 (!) 153/75 (!) 151/72 135/73   04/12/23 1930 04/12/23 1945 04/12/23 2000 04/12/23 2015  BP: 128/61 (!) 161/92 (!) 143/73 (!) 160/73   04/12/23 2030 04/12/23 2045 04/12/23 2100 04/12/23 2212  BP: 122/73 (!) 130/90 112/63 132/75  Patient is a lot of blood pressure variability suspect we can discontinue or reduce doses of her blood pressure meds currently we will hold Lasix and losartan and continue diltiazem and Coreg.  Anemia of chronic disease Patient does have a history of anemia and current hemoglobin is stable at 8.6. Will continue PPI therapy, as needed continue iron.  Thank you for this consultation.  We will follow the patient with you.  Time Spent on Consult: 35 minutes.   Gertha Calkin 04/13/2023, 12:10 AM

## 2023-04-12 NOTE — Consult Note (Incomplete)
Consult Note:   [name]:[id number] DOB: December 14, 1929 DOA: 04/12/2023 PCP: Emilio Aspen, MD  Requesting physician: Dr. Jonny Ruiz [facility] Reason for consultation: Medical management   History of Present Illness:   [name] is an 89 y.o. female past medical history of left hip dislocation, allergies to 13 medications which includes Keflex shellfish clindamycin vancomycin fosfomycin and Levaquin penicillamine take sulfa antibiotics please refer to complete list, anemia, CKD stage II, GERD, myocarditis, thoracic aneurysm, presenting today for left hip dislocation and pain while trying to get out of off the commode similar to her previous state presentation.  Patient lives in [place]. Per ED note unsuccessful reduction of the left hip in the emergency room after sedation.  And orthopedics was consulted for same.  Patient was taken to the OR and her left hip was reduced patient was in recovery and hospitalist consult was called for her multiple medical problems.   Review of Systems:   Review of Systems  Musculoskeletal:  Positive for joint pain.     Allergies:     Allergies  Allergen Reactions  . Cephalexin Other (See Comments)    Pt states this caused eye problems and STROKE-LIKE SYMPTOMS!!  . Shellfish-Derived Products Hives and Rash  . Clindamycin/Lincomycin Rash  . Lincomycin Rash  . Clindamycin Hcl Other (See Comments)    "Per pharmacist"  . Fosfomycin Other (See Comments)    Disorientation  . Levofloxacin Other (See Comments)    Reaction not recalled by the patient  . Penicillamine Other (See Comments)    Reaction not recalled by the patient  . Sugar-Protein-Starch Other (See Comments)    Sugary foods cause legs to cramp  . Tape Other (See Comments)    Please use paper tape or Coban Wrap; patient's skin tears VERY easily!!  . Ciprofloxacin Rash  . Sulfa Antibiotics Hives and Rash  . Sulfites Hives, Swelling and Rash    Past  Medical History:     Past Medical History:  Diagnosis Date  . Anemia    CHRONIC  . Aortic insufficiency    a. 2D echo 08/29/15: EF 55-60%, mild LVH, diastolic dysfunction, elevated LV filling pressure, mild AI, severe LAE, mild RAE, mild TR, dilated descending thoracic aorta just distal to the takeoff of the left subclavian, measuring 4.1 cm, no pericardial effusion.  . Arthritis    OA-SOME BACK AND NECK PAIN,  GOES TO CHIROPRACTOR TWICE A MONTH;  HX OF JOINT REPLACEMENTS   . Cancer (HCC)    SKIN CANCERS REMOVED FROM LEGS  . CKD (chronic kidney disease), stage II   . Depression   . Dyspnea   . Essential hypertension   . Gait abnormality 09/08/2017  . GERD (gastroesophageal reflux disease)   . Low sodium levels   . Myocarditis (HCC)   . PSVT (paroxysmal supraventricular tachycardia) (HCC)   . Recurrent Pericarditis    RECURRENT   . Thoracic aneurysm    a. Followed by [facility] -last seen in 12/2013 w/ plan for f/u MRA and thoracic surgery f/u in 2 yrs, but patient does not wish to follow any longer.    Past Surgical History:   Past Surgical History:  Procedure Laterality Date  . APPENDECTOMY    . BREAST EXCISIONAL BIOPSY Left 1992   benign  . EXCISION/RELEASE BURSA HIP Left 11/24/2012   Procedure: LEFT HIP BURSECTOMY AND TENDON REPAIR ;  Surgeon: Loanne Drilling, MD;  Location: [facility];  Service: Orthopedics;  Laterality: Left;  .  HIP CLOSED REDUCTION Left 12/19/2012   Procedure: CLOSED MANIPULATION HIP;  Surgeon: Shelda Pal, MD;  Location: [facility];  Service: Orthopedics;  Laterality: Left;  . HIP CLOSED REDUCTION Left 12/05/2013   Procedure: CLOSED MANIPULATION HIP;  Surgeon: Verlee Rossetti, MD;  Location: [facility];  Service: Orthopedics;  Laterality: Left;  . HIP CLOSED REDUCTION Left 09/22/2015   Procedure: CLOSED REDUCTION HIP;  Surgeon: Venita Lick, MD;  Location: [facility];  Service: Orthopedics;  Laterality: Left;  . HIP CLOSED REDUCTION Left 12/05/2022   Procedure: CLOSED  MANIPULATION HIP;  Surgeon: Ollen Gross, MD;  Location: [facility];  Service: Orthopedics;  Laterality: Left;  . JOINT REPLACEMENT     LEFT TOTAL HIP REPLACEMENT AND REVISIONS X 2  . OOPHORECTOMY     has a partial of one ovary remaingin  . PELVIC LAPAROSCOPY     ovarian cyst removal,   . REVISION TOTAL HIP ARTHROPLASTY     left  . right Achilles Tendon repair  5/13  . TOTAL KNEE ARTHROPLASTY     right  . TOTAL KNEE ARTHROPLASTY     left  . VAGINAL HYSTERECTOMY     with ovarian cyst removal    Current Medications:   Scheduled Meds: . allopurinol  300 mg Oral Daily  . [START ON 04/13/2023] carvedilol  3.125 mg Oral BID WC  . [START ON 04/13/2023] colchicine  0.6 mg Oral QODAY  . diltiazem  120 mg Oral Daily  . [START ON 04/13/2023] ferrous sulfate  650 mg Oral Q breakfast  . [START ON 04/13/2023] furosemide  20 mg Oral QODAY  . losartan  25 mg Oral Daily  . mupirocin ointment  1 Application Topical BID  . triamcinolone cream   Topical TID   Continuous Infusions: . acetaminophen     PRN Meds:.acetaminophen  Social History:    reports that she quit smoking about 60 years ago. Her smoking use included cigarettes. She started smoking about 65 years ago. She has never used smokeless tobacco. She reports that she does not currently use drugs. She reports that she does not drink alcohol.  Physical Exam:   Physical Exam Vitals and nursing note reviewed.  Constitutional:      General: She is not in acute distress.    Appearance: She is not ill-appearing.  HENT:     Head: Normocephalic and atraumatic.     Right Ear: Hearing normal.     Left Ear: Hearing normal.     Nose: Nose normal. No nasal deformity.     Mouth/Throat:     Lips: Pink.     Tongue: No lesions.  Eyes:     General: Lids are normal.     Extraocular Movements: Extraocular movements intact.  Cardiovascular:     Rate and Rhythm: Normal rate and regular rhythm.     Heart sounds: Normal heart sounds.  Pulmonary:      Effort: Pulmonary effort is normal.     Breath sounds: Examination of the right-lower field reveals wheezing. Examination of the left-lower field reveals wheezing. Wheezing present.  Abdominal:     General: Bowel sounds are normal. There is no distension.     Palpations: Abdomen is soft. There is no mass.     Tenderness: There is no abdominal tenderness.  Musculoskeletal:     Right lower leg: No edema.     Left lower leg: No edema.  Skin:    General: Skin is warm.  Neurological:  General: No focal deficit present.     Mental Status: She is alert and oriented to person, place, and time.     Cranial Nerves: Cranial nerves 2-12 are intact.  Psychiatric:        Speech: Speech normal.    Data Review:   Labs:  Basic Metabolic Panel: Recent Labs  Lab 04/12/23 1024  NA 135  K 4.5  CL 101  CO2 23  GLUCOSE 86  BUN 24*  CREATININE 0.81  CALCIUM 9.0   GFR Estimated Creatinine Clearance: 39.1 mL/min (by C-G formula based on SCr of 0.81 mg/dL). Liver Function Tests: No results for input(s): "AST", "ALT", "ALKPHOS", "BILITOT", "PROT", "ALBUMIN" in the last 168 hours. No results for input(s): "LIPASE", "AMYLASE" in the last 168 hours. No results for input(s): "AMMONIA" in the last 168 hours. Coagulation profile No results for input(s): "INR", "PROTIME" in the last 168 hours.  CBC: Recent Labs  Lab 04/12/23 1024  WBC 7.4  NEUTROABS 4.4  HGB 8.6*  HCT 26.9*  MCV 84.3  PLT 366   Urinalysis    Component Value Date/Time   COLORURINE YELLOW 12/30/2022 1736   APPEARANCEUR CLEAR 12/30/2022 1736   LABSPEC 1.012 12/30/2022 1736   PHURINE 6.0 12/30/2022 1736   GLUCOSEU NEGATIVE 12/30/2022 1736   HGBUR NEGATIVE 12/30/2022 1736   BILIRUBINUR NEGATIVE 12/30/2022 1736   KETONESUR NEGATIVE 12/30/2022 1736   PROTEINUR NEGATIVE 12/30/2022 1736   UROBILINOGEN 0.2 04/17/2021 1314   NITRITE NEGATIVE 12/30/2022 1736   LEUKOCYTESUR TRACE (A) 12/30/2022 1736   Sepsis  Labs Recent Labs  Lab 04/12/23 1024  WBC 7.4   Microbiology No results found for this or any previous visit (from the past 240 hours).   Radiological Exams: DG Pelvis Portable Result Date: 04/12/2023 CLINICAL DATA:  Post reduction of dislocated left total hip arthroplasty. EXAM: PORTABLE PELVIS 1-2 VIEWS COMPARISON:  Radiographs earlier the same date. FINDINGS: 1705 hours. Single AP view of the left hip demonstrates normal alignment of the total hip arthroplasty in this single projection. No evidence of acute fracture. The distal aspect of the femoral prosthesis is not imaged. IMPRESSION: Interval reduction of dislocated left total hip arthroplasty. Electronically Signed   By: Carey Bullocks M.D.   On: 04/12/2023 17:20   DG Hip Port Unilat W or Wo Pelvis 1 View Left Result Date: 04/12/2023 CLINICAL DATA:  Status post attempted reduction. EXAM: DG HIP (WITH OR WITHOUT PELVIS) 1V PORT LEFT COMPARISON:  Same day. FINDINGS: There is continued superior dislocation of left hip arthroplasty. IMPRESSION: Continued superior dislocation of left hip arthroplasty. Electronically Signed   By: Lupita Raider M.D.   On: 04/12/2023 12:50   DG Hip Unilat W or Wo Pelvis 2-3 Views Left Result Date: 04/12/2023 CLINICAL DATA:  Left hip dislocation EXAM: DG HIP (WITH OR WITHOUT PELVIS) 2-3V LEFT COMPARISON:  12/30/2022 FINDINGS: Status post left total hip arthroplasty. Superior dislocation of the femoral component relative to the acetabular cup. No periprosthetic fracture is identified. IMPRESSION: Superior dislocation of the left total hip arthroplasty. Electronically Signed   By: Duanne Guess D.O.   On: 04/12/2023 11:17    Assessment and Plan:   Principal Problem:   Closed anterior dislocation of left hip (HCC)   Thank you for this consultation.  We will follow the patient with you.  Time Spent on Consult: ***  Gertha Calkin 04/12/2023, 9:39 PM

## 2023-04-12 NOTE — H&P (Addendum)
[name] is an 89 y.o. female.   Chief Complaint: left hip pain HPI: 89 y/o female with h/o multiple left hip dislocations injured her hip this morning at her nursing home.  She was sitting on the commode and twisted and felt it shift.  She was brought to the ER where xrays show a dislocation of her prosthetic hip.  An attempt was made in the ER to reduce the dislocation under conscious sedation.  The attempt was unsuccessful, so she presents now for closed reduction under general anesthesia.  Past Medical History:  Diagnosis Date   Anemia    CHRONIC   Aortic insufficiency    a. 2D echo 08/29/15: EF 55-60%, mild LVH, diastolic dysfunction, elevated LV filling pressure, mild AI, severe LAE, mild RAE, mild TR, dilated descending thoracic aorta just distal to the takeoff of the left subclavian, measuring 4.1 cm, no pericardial effusion.   Arthritis    OA-SOME BACK AND NECK PAIN,  GOES TO CHIROPRACTOR TWICE A MONTH;  HX OF JOINT REPLACEMENTS    Cancer (HCC)    SKIN CANCERS REMOVED FROM LEGS   CKD (chronic kidney disease), stage II    Depression    Dyspnea    Essential hypertension    Gait abnormality 09/08/2017   GERD (gastroesophageal reflux disease)    Low sodium levels    Myocarditis (HCC)    PSVT (paroxysmal supraventricular tachycardia) (HCC)    Recurrent Pericarditis    RECURRENT    Thoracic aneurysm    a. Followed by [facility] -last seen in 12/2013 w/ plan for f/u MRA and thoracic surgery f/u in 2 yrs, but patient does not wish to follow any longer.    Past Surgical History:  Procedure Laterality Date   APPENDECTOMY     BREAST EXCISIONAL BIOPSY Left 1992   benign   EXCISION/RELEASE BURSA HIP Left 11/24/2012   Procedure: LEFT HIP BURSECTOMY AND TENDON REPAIR ;  Surgeon: Loanne Drilling, MD;  Location: [facility];  Service: Orthopedics;  Laterality: Left;   HIP CLOSED REDUCTION Left 12/19/2012   Procedure: CLOSED MANIPULATION HIP;  Surgeon: Shelda Pal, MD;  Location: [facility];   Service: Orthopedics;  Laterality: Left;   HIP CLOSED REDUCTION Left 12/05/2013   Procedure: CLOSED MANIPULATION HIP;  Surgeon: Verlee Rossetti, MD;  Location: [facility];  Service: Orthopedics;  Laterality: Left;   HIP CLOSED REDUCTION Left 09/22/2015   Procedure: CLOSED REDUCTION HIP;  Surgeon: Venita Lick, MD;  Location: [facility];  Service: Orthopedics;  Laterality: Left;   HIP CLOSED REDUCTION Left 12/05/2022   Procedure: CLOSED MANIPULATION HIP;  Surgeon: Ollen Gross, MD;  Location: [facility];  Service: Orthopedics;  Laterality: Left;   JOINT REPLACEMENT     LEFT TOTAL HIP REPLACEMENT AND REVISIONS X 2   OOPHORECTOMY     has a partial of one ovary remaingin   PELVIC LAPAROSCOPY     ovarian cyst removal,    REVISION TOTAL HIP ARTHROPLASTY     left   right Achilles Tendon repair  5/13   TOTAL KNEE ARTHROPLASTY     right   TOTAL KNEE ARTHROPLASTY     left   VAGINAL HYSTERECTOMY     with ovarian cyst removal    Family History  Problem Relation Age of Onset   Hypertension Mother    Stroke Mother    Heart attack Father 54   Heart failure Father    Heart failure Sister    Aortic aneurysm Sister  Aortic aneurysm Sister    Stroke Sister    Pneumonia Brother    Stroke Brother    ALS Other    Hyperlipidemia Daughter    Aortic aneurysm Son    Breast cancer Neg Hx    Social History:  reports that she quit smoking about 60 years ago. Her smoking use included cigarettes. She started smoking about 65 years ago. She has never used smokeless tobacco. She reports that she does not currently use drugs. She reports that she does not drink alcohol.  Allergies:  Allergies  Allergen Reactions   Cephalexin Other (See Comments)    Pt states this caused eye problems and STROKE-LIKE SYMPTOMS!!   Shellfish-Derived Products Hives and Rash   Clindamycin/Lincomycin Rash   Lincomycin Rash   Clindamycin Hcl Other (See Comments)    "Per pharmacist"   Fosfomycin Other (See Comments)     Disorientation   Levofloxacin Other (See Comments)    Reaction not recalled by the patient   Penicillamine Other (See Comments)    Reaction not recalled by the patient   Sugar-Protein-Starch Other (See Comments)    Sugary foods cause legs to cramp   Tape Other (See Comments)    Please use paper tape or Coban Wrap; patient's skin tears VERY easily!!   Ciprofloxacin Rash   Sulfa Antibiotics Hives and Rash   Sulfites Hives, Swelling and Rash    Medications Prior to Admission  Medication Sig Dispense Refill   allopurinol (ZYLOPRIM) 100 MG tablet Take 50 mg by mouth daily.     B Complex Vitamins (VITAMIN B COMPLEX) TABS Take 1 tablet by mouth daily with breakfast.     carvedilol (COREG) 12.5 MG tablet Take 12.5 mg by mouth 2 (two) times daily with a meal.     Cholecalciferol (VITAMIN D3) 25 MCG (1000 UT) CAPS Take 1,000 Units by mouth daily.     diltiazem (CARDIZEM CD) 240 MG 24 hr capsule Take 1 capsule (240 mg total) by mouth daily. 90 capsule 0   FEROSUL 325 (65 Fe) MG tablet Take 650 mg by mouth daily with breakfast.     hydrocerin (EUCERIN) CREA Apply 1 Application topically 2 (two) times daily. Apply to intact skin of bilateral legs and feet twice daily. DO NOT PLACE IN BETWEEN TOES. 454 g 0   hydrOXYzine (ATARAX) 25 MG tablet Take 1 tablet (25 mg total) by mouth every 4 (four) hours as needed for itching. 20 tablet 0   loratadine (CLARITIN) 10 MG tablet Take 10 mg by mouth 2 (two) times daily.     melatonin 3 MG TABS tablet Take 1 tablet (3 mg total) by mouth at bedtime as needed (insomnia).     MITIGARE 0.6 MG CAPS Take 0.6 mg by mouth every other day.     Multiple Vitamins-Minerals (SYSTANE ICAPS AREDS2 PO) Take 1 capsule by mouth in the morning and at bedtime.     sodium chloride 1 g tablet Take 1 g by mouth daily.     triamcinolone cream (KENALOG) 0.1 % Apply topically 3 (three) times daily. To areas of psoariatic rash. Do not apply to face. 30 g 0   TYLENOL 500 MG tablet Take  500-1,000 mg by mouth every 6 (six) hours as needed for mild pain or headache.     amoxicillin (AMOXIL) 500 MG capsule Take 2,000 mg by mouth See admin instructions. Take 2,000 mg by mouth one hour prior to dental procedures     doxycycline (VIBRA-TABS) 100 MG tablet  Take 1 tablet (100 mg total) by mouth 2 (two) times daily. 28 tablet 0   furosemide (LASIX) 20 MG tablet Take 1 tablet (20 mg total) by mouth every other day. 45 tablet 3   mupirocin ointment (BACTROBAN) 2 % Apply 1 Application topically 2 (two) times daily. 30 g 2   predniSONE (DELTASONE) 20 MG tablet 2 tabs po daily x 4 days 8 tablet 0    Results for orders placed or performed during the hospital encounter of 04/12/23 (from the past 48 hours)  Basic metabolic panel     Status: Abnormal   Collection Time: 04/12/23 10:24 AM  Result Value Ref Range   Sodium 135 135 - 145 mmol/L   Potassium 4.5 3.5 - 5.1 mmol/L   Chloride 101 98 - 111 mmol/L   CO2 23 22 - 32 mmol/L   Glucose, Bld 86 70 - 99 mg/dL    Comment: Glucose reference range applies only to samples taken after fasting for at least 8 hours.   BUN 24 (H) 8 - 23 mg/dL   Creatinine, Ser 1.61 0.44 - 1.00 mg/dL   Calcium 9.0 8.9 - 09.6 mg/dL   GFR, Estimated >04 >54 mL/min    Comment: (NOTE) Calculated using the CKD-EPI Creatinine Equation (2021)    Anion gap 11 5 - 15    Comment: Performed at [facility], [address], [place], [place] [address]  CBC with Differential     Status: Abnormal   Collection Time: 04/12/23 10:24 AM  Result Value Ref Range   WBC 7.4 4.0 - 10.5 K/uL   RBC 3.19 (L) 3.87 - 5.11 MIL/uL   Hemoglobin 8.6 (L) 12.0 - 15.0 g/dL   HCT 91.4 (L) 78.2 - 95.6 %   MCV 84.3 80.0 - 100.0 fL   MCH 27.0 26.0 - 34.0 pg   MCHC 32.0 30.0 - 36.0 g/dL   RDW 21.3 (H) 08.6 - 57.8 %   Platelets 366 150 - 400 K/uL   nRBC 0.0 0.0 - 0.2 %   Neutrophils Relative % 60 %   Neutro Abs 4.4 1.7 - 7.7 K/uL   Lymphocytes Relative 15 %   Lymphs Abs 1.1  0.7 - 4.0 K/uL   Monocytes Relative 11 %   Monocytes Absolute 0.8 0.1 - 1.0 K/uL   Eosinophils Relative 12 %   Eosinophils Absolute 0.9 (H) 0.0 - 0.5 K/uL   Basophils Relative 2 %   Basophils Absolute 0.1 0.0 - 0.1 K/uL   Immature Granulocytes 0 %   Abs Immature Granulocytes 0.02 0.00 - 0.07 K/uL    Comment: Performed at [facility], [address], [place], [place] [address]   DG Hip [place] or [place] Pelvis 1 View Left Result Date: 04/12/2023 CLINICAL DATA:  Status post attempted reduction. EXAM: DG HIP (WITH OR WITHOUT PELVIS) 1V PORT LEFT COMPARISON:  Same day. FINDINGS: There is continued superior dislocation of left hip arthroplasty. IMPRESSION: Continued superior dislocation of left hip arthroplasty. Electronically Signed   By: Lupita Raider M.D.   On: 04/12/2023 12:50   DG Hip Unilat W or Wo Pelvis 2-3 Views Left Result Date: 04/12/2023 CLINICAL DATA:  Left hip dislocation EXAM: DG HIP (WITH OR WITHOUT PELVIS) 2-3V LEFT COMPARISON:  12/30/2022 FINDINGS: Status post left total hip arthroplasty. Superior dislocation of the femoral component relative to the acetabular cup. No periprosthetic fracture is identified. IMPRESSION: Superior dislocation of the left total hip arthroplasty. Electronically Signed  By: Duanne Guess D.O.   On: 04-24-23 11:17    Review of Systems no recent f/c/n/v/wt loss  Blood pressure (!) 165/82, pulse 81, temperature (!) 97.5 F (36.4 C), temperature source Oral, resp. rate 18, height 5\' 4"  (1.626 m), weight 57.3 kg, SpO2 100%. Physical Exam  Thin cachectic appearing elderly female in nad.  A and O.  EOMI.  Resp ulabored.  L LE shortened and internally rotated.  Palpable DP pulse.  Intact sens to LT at the foot dorsally and plantarly.  Severe 2nd hammertoe.  Active PF and DF strength of the ankle.  Assessment/Plan L hip prosthetic hip dislocation - to the OR today for closed reduction under general anesthesia.  The risks and  benefits of the alternative treatment options have been discussed in detail.  The patient wishes to proceed with surgery and specifically understands risks of bleeding, infection, nerve damage, blood clots, need for additional surgery, amputation and death.   Toni Arthurs, MD 04-24-23, 2:41 PM  Addendum:  The patient underwent successful reduction of the L hip dislocation and tolerated the procedure well.  In recovery she was c/o considerable pain in the hip.  She lives in assisted living rather than skilled nursing as I was told by the ED MD.  She does not feel like she could tolerate being at home tonight.  I will admit her for observation and obtain hospitalist consultation for her medical comorbidities.  PT tomorrow for ambulation and posterior hip precautions.

## 2023-04-12 NOTE — ED Triage Notes (Signed)
Pt here from home with c/o possible left hip dislocation after twisting and trying to get up off the toilet , hx of same

## 2023-04-12 NOTE — Progress Notes (Addendum)
Dr. Victorino Dike notified that patient lives in a Senior living facility, not a  SNF with current plans to discharge there on return. MD states that patient is stable to discharge as planned if  she feels comfortable.

## 2023-04-12 NOTE — Discharge Instructions (Addendum)
You may bear weight on the left lower extremity with the knee immobilizer in place.  You may take off only to bathe, and you must have assistance while bathing.  Please observe posterior hip precautions.  Do not flex your hip past 90 deg.  Do not squat or cross your legs.  Follow up with Dr. Lequita Halt in two weeks.

## 2023-04-12 NOTE — Anesthesia Preprocedure Evaluation (Addendum)
Anesthesia Evaluation  Patient identified by MRN, date of birth, ID band Patient awake    Reviewed: Allergy & Precautions, NPO status , Patient's Chart, lab work & pertinent test results  Airway Mallampati: II  TM Distance: >3 FB Neck ROM: Full    Dental  (+) Caps, Dental Advisory Given   Pulmonary former smoker   breath sounds clear to auscultation       Cardiovascular hypertension, +CHF   Rhythm:Regular Rate:Normal  Echo:   1. Left ventricular ejection fraction, by estimation, is 60 to 65%. The  left ventricle has normal function. The left ventricle has no regional  wall motion abnormalities. Left ventricular diastolic parameters are  consistent with Grade II diastolic  dysfunction (pseudonormalization). Elevated left ventricular end-diastolic  pressure.   2. Right ventricular systolic function is normal. The right ventricular  size is normal. There is normal pulmonary artery systolic pressure.   3. Left atrial size was severely dilated.   4. The mitral valve is abnormal. Trivial mitral valve regurgitation. No  evidence of mitral stenosis.   5. The aortic valve is tricuspid. There is mild calcification of the  aortic valve. There is mild thickening of the aortic valve. Aortic valve  regurgitation is mild to moderate. Aortic valve sclerosis is present, with  no evidence of aortic valve  stenosis.   6. Aortic dilatation noted. There is mild dilatation of the aortic root,  measuring 38 mm. There is moderate dilatation of the ascending aorta,  measuring 41 mm.   7. The inferior vena cava is normal in size with greater than 50%  respiratory variability, suggesting right atrial pressure of 3 mmHg.      Neuro/Psych  PSYCHIATRIC DISORDERS  Depression     Neuromuscular disease    GI/Hepatic Neg liver ROS,GERD  ,,  Endo/Other  negative endocrine ROS    Renal/GU Renal disease     Musculoskeletal  (+) Arthritis ,     Abdominal   Peds  Hematology  (+) Blood dyscrasia, anemia   Anesthesia Other Findings   Reproductive/Obstetrics                             Anesthesia Physical Anesthesia Plan  ASA: 3  Anesthesia Plan: General   Post-op Pain Management: Minimal or no pain anticipated   Induction: Intravenous  PONV Risk Score and Plan: 4 or greater and Ondansetron and Treatment may vary due to age or medical condition  Airway Management Planned: Mask  Additional Equipment: None  Intra-op Plan:   Post-operative Plan:   Informed Consent: I have reviewed the patients History and Physical, chart, labs and discussed the procedure including the risks, benefits and alternatives for the proposed anesthesia with the patient or authorized representative who has indicated his/her understanding and acceptance.   Patient has DNR.  Discussed DNR with patient and Continue DNR.     Plan Discussed with: CRNA  Anesthesia Plan Comments: (No blood per patient.)       Anesthesia Quick Evaluation

## 2023-04-12 NOTE — Anesthesia Procedure Notes (Signed)
Procedure Name: Intubation Date/Time: 04/12/2023 4:48 PM  Performed by: Theodosia Quay, CRNAPre-anesthesia Checklist: Patient identified, Emergency Drugs available, Suction available, Patient being monitored and Timeout performed Patient Re-evaluated:Patient Re-evaluated prior to induction Oxygen Delivery Method: Circle system utilized Preoxygenation: Pre-oxygenation with 100% oxygen Induction Type: IV induction Ventilation: Mask ventilation without difficulty Laryngoscope Size: Mac and 3 Grade View: Grade I Tube type: Oral Tube size: 7.0 mm Number of attempts: 1 Airway Equipment and Method: Stylet Placement Confirmation: ETT inserted through vocal cords under direct vision, positive ETCO2, CO2 detector and breath sounds checked- equal and bilateral Secured at: 21 cm Tube secured with: Tape Dental Injury: Teeth and Oropharynx as per pre-operative assessment

## 2023-04-12 NOTE — Progress Notes (Signed)
Assisted with Conscious sedation. Patient on 2l cannula setup to monitor end tidal co2. Suction setup and on at bedside. Ambu bag connected to flow meter with 15L O2 in case needed. Monitored patients breathing throughout the entirety of the procedure.  Pts O2 saturations remained 94 and above throughout the entire procedure.End tidal Co2 30. Pt is awake and breathing normally.

## 2023-04-13 ENCOUNTER — Encounter (HOSPITAL_COMMUNITY): Payer: Self-pay | Admitting: Orthopedic Surgery

## 2023-04-13 ENCOUNTER — Other Ambulatory Visit: Payer: Self-pay

## 2023-04-13 DIAGNOSIS — S73035D Other anterior dislocation of left hip, subsequent encounter: Secondary | ICD-10-CM | POA: Diagnosis not present

## 2023-04-13 LAB — BASIC METABOLIC PANEL
Anion gap: 10 (ref 5–15)
BUN: 29 mg/dL — ABNORMAL HIGH (ref 8–23)
CO2: 23 mmol/L (ref 22–32)
Calcium: 8.6 mg/dL — ABNORMAL LOW (ref 8.9–10.3)
Chloride: 99 mmol/L (ref 98–111)
Creatinine, Ser: 0.98 mg/dL (ref 0.44–1.00)
GFR, Estimated: 54 mL/min — ABNORMAL LOW (ref >60)
Glucose, Bld: 158 mg/dL — ABNORMAL HIGH (ref 70–99)
Potassium: 4.4 mmol/L (ref 3.5–5.1)
Sodium: 132 mmol/L — ABNORMAL LOW (ref 135–145)

## 2023-04-13 LAB — CBC WITH DIFFERENTIAL/PLATELET
Abs Immature Granulocytes: 0.04 10*3/uL (ref 0.00–0.07)
Basophils Absolute: 0.1 10*3/uL (ref 0.0–0.1)
Basophils Relative: 1 %
Eosinophils Absolute: 0.7 10*3/uL — ABNORMAL HIGH (ref 0.0–0.5)
Eosinophils Relative: 9 %
HCT: 26.5 % — ABNORMAL LOW (ref 36.0–46.0)
Hemoglobin: 8.1 g/dL — ABNORMAL LOW (ref 12.0–15.0)
Immature Granulocytes: 1 %
Lymphocytes Relative: 10 %
Lymphs Abs: 0.7 10*3/uL (ref 0.7–4.0)
MCH: 26.5 pg (ref 26.0–34.0)
MCHC: 30.6 g/dL (ref 30.0–36.0)
MCV: 86.6 fL (ref 80.0–100.0)
Monocytes Absolute: 0.7 10*3/uL (ref 0.1–1.0)
Monocytes Relative: 10 %
Neutro Abs: 5.3 10*3/uL (ref 1.7–7.7)
Neutrophils Relative %: 69 %
Platelets: 351 10*3/uL (ref 150–400)
RBC: 3.06 MIL/uL — ABNORMAL LOW (ref 3.87–5.11)
RDW: 15.9 % — ABNORMAL HIGH (ref 11.5–15.5)
WBC: 7.5 10*3/uL (ref 4.0–10.5)
nRBC: 0 % (ref 0.0–0.2)

## 2023-04-13 LAB — MAGNESIUM: Magnesium: 2 mg/dL (ref 1.7–2.4)

## 2023-04-13 LAB — PHOSPHORUS: Phosphorus: 4.4 mg/dL (ref 2.5–4.6)

## 2023-04-13 MED ORDER — HEPARIN SODIUM (PORCINE) 5000 UNIT/ML IJ SOLN
5000.0000 [IU] | Freq: Two times a day (BID) | INTRAMUSCULAR | Status: DC
  Administered 2023-04-13 – 2023-04-15 (×5): 5000 [IU] via SUBCUTANEOUS
  Filled 2023-04-13 (×6): qty 1

## 2023-04-13 MED ORDER — CARVEDILOL 3.125 MG PO TABS
3.1250 mg | ORAL_TABLET | Freq: Two times a day (BID) | ORAL | Status: DC
  Administered 2023-04-13 – 2023-04-15 (×6): 3.125 mg via ORAL
  Filled 2023-04-13 (×5): qty 1

## 2023-04-13 MED ORDER — METOPROLOL TARTRATE 5 MG/5ML IV SOLN: 5.0000 mg | INTRAVENOUS | Status: DC | PRN

## 2023-04-13 MED ORDER — DILTIAZEM HCL ER COATED BEADS 120 MG PO CP24
120.0000 mg | ORAL_CAPSULE | Freq: Every morning | ORAL | Status: DC
  Administered 2023-04-13 – 2023-04-15 (×3): 120 mg via ORAL
  Filled 2023-04-13 (×3): qty 1

## 2023-04-13 MED ORDER — HYDRALAZINE HCL 20 MG/ML IJ SOLN
10.0000 mg | Freq: Three times a day (TID) | INTRAMUSCULAR | Status: DC | PRN
  Administered 2023-04-13: 10 mg via INTRAVENOUS

## 2023-04-13 MED ORDER — HYDROCERIN EX CREA
TOPICAL_CREAM | Freq: Two times a day (BID) | CUTANEOUS | Status: DC
  Filled 2023-04-13: qty 113

## 2023-04-13 MED ORDER — HYDRALAZINE HCL 20 MG/ML IJ SOLN
10.0000 mg | Freq: Once | INTRAMUSCULAR | Status: DC | PRN
  Filled 2023-04-13: qty 1

## 2023-04-13 MED ORDER — HYDRALAZINE HCL 20 MG/ML IJ SOLN: 10.0000 mg | INTRAMUSCULAR | Status: DC | PRN

## 2023-04-13 NOTE — Progress Notes (Signed)
PROGRESS NOTE    [name]  WUJ:[id number] DOB: Dec 21, 1929 DOA: 04/12/2023 PCP: Emilio Aspen, MD    Brief Narrative:   89 year old with history of left hip dislocation, multiple allergies to antibiotics, anemia of chronic disease, CKD stage II, GERD, myocarditis, thoracic aneurysm presented today with left hip dislocation and pain while getting off commode.  Patient was admitted to orthopedic surgery as ED was unsuccessful in reducing her left hip.  Patient went to the OR for closed reduction on 1/28.  Medical team was consulted for multiple comorbidity management.  Assessment & Plan:  Principal Problem:   Closed anterior dislocation of left hip (HCC) Active Problems:   Chronic heart failure with preserved ejection fraction (HFpEF) (HCC)   Essential hypertension   Anemia of chronic disease   Left hip dislocation - Status post closed reduction by orthopedic on 1/28.  Management per their service  Congestive heart failure with preserved ejection fraction - Continue home medications  CKD stage II - Creatinine stable at 0.98.  Anemia of chronic disease - On daily iron supplements.  Bowel regimen.  Hemoglobin is stable around 8.5  Essential hypertension -Continue Coreg, Cardizem.  Will add IV as needed   DVT prophylaxis: heparin injection 5,000 Units Start: 04/13/23 1000 SCDs Start: 04/12/23 2352      Code Status: Do not attempt resuscitation (DNR) PRE-ARREST INTERVENTIONS DESIRED Family Communication:   Final disposition per primary team Medically stable from my and for discharge   Subjective: Laying in the bed no complaints   Examination:  General exam: Appears calm and comfortable  Respiratory system: Clear to auscultation. Respiratory effort normal. Cardiovascular system: S1 & S2 heard, RRR. No JVD, murmurs, rubs, gallops or clicks. No pedal edema. Gastrointestinal system: Abdomen is nondistended, soft and nontender. No organomegaly or masses  felt. Normal bowel sounds heard. Central nervous system: Alert and oriented. No focal neurological deficits. Extremities: Symmetric 5 x 5 power. Skin: No rashes, lesions or ulcers Psychiatry: Judgement and insight appear normal. Mood & affect appropriate.                Diet Orders (From admission, onward)     Start     Ordered   04/13/23 0000  Diet - low sodium heart healthy        04/13/23 1118   04/12/23 2352  Diet regular Room service appropriate? Yes; Fluid consistency: Thin  Diet effective now       Question Answer Comment  Room service appropriate? Yes   Fluid consistency: Thin      04/12/23 2351   04/12/23 0000  Diet - low sodium heart healthy        04/12/23 1727            Objective: Vitals:   04/12/23 2212 04/13/23 0100 04/13/23 0526 04/13/23 1004  BP: 132/75 (!) 179/95 118/64 128/62  Pulse: 79 90 90 88  Resp: 18 18 18 16   Temp: (!) 97.5 F (36.4 C) 97.9 F (36.6 C) 98.7 F (37.1 C) 98.7 F (37.1 C)  TempSrc: Oral  Oral   SpO2: 100% 100% 100% 98%  Weight:      Height:        Intake/Output Summary (Last 24 hours) at 04/13/2023 1155 Last data filed at 04/13/2023 1034 Gross per 24 hour  Intake 1660 ml  Output 250 ml  Net 1410 ml   Filed Weights   04/12/23 1037  Weight: 57.3 kg    Scheduled Meds:  allopurinol  300  mg Oral Daily   carvedilol  3.125 mg Oral BID WC   colchicine  0.6 mg Oral QODAY   diltiazem  120 mg Oral q AM   docusate sodium  100 mg Oral BID   ferrous sulfate  650 mg Oral Q breakfast   heparin injection (subcutaneous)  5,000 Units Subcutaneous Q12H   hydrocerin   Topical BID   mupirocin ointment  1 Application Topical BID   triamcinolone cream   Topical TID   Continuous Infusions:  Nutritional status     Body mass index is 21.68 kg/m.  Data Reviewed:   CBC: Recent Labs  Lab 04/12/23 1024 04/13/23 0340  WBC 7.4 7.5  NEUTROABS 4.4 5.3  HGB 8.6* 8.1*  HCT 26.9* 26.5*  MCV 84.3 86.6  PLT 366 351    Basic Metabolic Panel: Recent Labs  Lab 04/12/23 1024 04/13/23 0340  NA 135 132*  K 4.5 4.4  CL 101 99  CO2 23 23  GLUCOSE 86 158*  BUN 24* 29*  CREATININE 0.81 0.98  CALCIUM 9.0 8.6*  MG  --  2.0  PHOS  --  4.4   GFR: Estimated Creatinine Clearance: 32.3 mL/min (by C-G formula based on SCr of 0.98 mg/dL). Liver Function Tests: No results for input(s): "AST", "ALT", "ALKPHOS", "BILITOT", "PROT", "ALBUMIN" in the last 168 hours. No results for input(s): "LIPASE", "AMYLASE" in the last 168 hours. No results for input(s): "AMMONIA" in the last 168 hours. Coagulation Profile: No results for input(s): "INR", "PROTIME" in the last 168 hours. Cardiac Enzymes: No results for input(s): "CKTOTAL", "CKMB", "CKMBINDEX", "TROPONINI" in the last 168 hours. BNP (last 3 results) No results for input(s): "PROBNP" in the last 8760 hours. HbA1C: No results for input(s): "HGBA1C" in the last 72 hours. CBG: No results for input(s): "GLUCAP" in the last 168 hours. Lipid Profile: No results for input(s): "CHOL", "HDL", "LDLCALC", "TRIG", "CHOLHDL", "LDLDIRECT" in the last 72 hours. Thyroid Function Tests: No results for input(s): "TSH", "T4TOTAL", "FREET4", "T3FREE", "THYROIDAB" in the last 72 hours. Anemia Panel: No results for input(s): "VITAMINB12", "FOLATE", "FERRITIN", "TIBC", "IRON", "RETICCTPCT" in the last 72 hours. Sepsis Labs: No results for input(s): "PROCALCITON", "LATICACIDVEN" in the last 168 hours.  No results found for this or any previous visit (from the past 240 hours).       Radiology Studies: DG Pelvis Portable Result Date: 04/12/2023 CLINICAL DATA:  Post reduction of dislocated left total hip arthroplasty. EXAM: PORTABLE PELVIS 1-2 VIEWS COMPARISON:  Radiographs earlier the same date. FINDINGS: 1705 hours. Single AP view of the left hip demonstrates normal alignment of the total hip arthroplasty in this single projection. No evidence of acute fracture. The distal  aspect of the femoral prosthesis is not imaged. IMPRESSION: Interval reduction of dislocated left total hip arthroplasty. Electronically Signed   By: Carey Bullocks M.D.   On: 04/12/2023 17:20   DG Hip Port Unilat W or Wo Pelvis 1 View Left Result Date: 04/12/2023 CLINICAL DATA:  Status post attempted reduction. EXAM: DG HIP (WITH OR WITHOUT PELVIS) 1V PORT LEFT COMPARISON:  Same day. FINDINGS: There is continued superior dislocation of left hip arthroplasty. IMPRESSION: Continued superior dislocation of left hip arthroplasty. Electronically Signed   By: Lupita Raider M.D.   On: 04/12/2023 12:50   DG Hip Unilat W or Wo Pelvis 2-3 Views Left Result Date: 04/12/2023 CLINICAL DATA:  Left hip dislocation EXAM: DG HIP (WITH OR WITHOUT PELVIS) 2-3V LEFT COMPARISON:  12/30/2022 FINDINGS: Status post left total  hip arthroplasty. Superior dislocation of the femoral component relative to the acetabular cup. No periprosthetic fracture is identified. IMPRESSION: Superior dislocation of the left total hip arthroplasty. Electronically Signed   By: Duanne Guess D.O.   On: 04/12/2023 11:17           LOS: 0 days   Time spent= 35 mins    Miguel Rota, MD [facility]  If 7PM-7AM, please contact night-coverage  04/13/2023, 11:55 AM

## 2023-04-13 NOTE — Assessment & Plan Note (Addendum)
Currently patient is euvolemic.  Will hold Lasix as her blood pressures low normal.  Obtain orthostatics prior to discharge and resume home meds as deemed appropriate.  We will hold patient's Lasix / losartan and will continue diltiazem and Coreg.

## 2023-04-13 NOTE — Plan of Care (Signed)
Problem: Education: Goal: Knowledge of General Education information will improve Description Including pain rating scale, medication(s)/side effects and non-pharmacologic comfort measures Outcome: Progressing

## 2023-04-13 NOTE — Assessment & Plan Note (Addendum)
Patient does have a history of anemia and current hemoglobin is stable at 8.6. Will continue PPI therapy, as needed continue iron.

## 2023-04-13 NOTE — TOC Initial Note (Addendum)
Transition of Care [facility]) - Initial/Assessment Note    Patient Details  Name: [name] MRN: [id number] Date of Birth: 22-Nov-1929  Transition of Care [facility]) CM/SW Contact:    Howell Rucks, RN Phone Number: 04/13/2023, 9:58 AM  Clinical Narrative: Met with pt at bedside to introduce role of TOC/NCM and review for dc planning, pt confirmed she resides at [facility], plans to return, PTAR for transportation. PT eval pending, await recommendation.   -10:00am Call to [facility], confirmed pt ILF resident with plans to transition to ALF, no FL2 required.                   Expected Discharge Plan: Home w Home Health Services Barriers to Discharge: Continued Medical Work up   Patient Goals and CMS Choice Patient states their goals for this hospitalization and ongoing recovery are:: return to ILF at [facility]          Expected Discharge Plan and Services       Living arrangements for the past 2 months: Independent Living Facility Expected Discharge Date: 04/12/23                                    Prior Living Arrangements/Services Living arrangements for the past 2 months: Independent Living Facility Lives with:: Self Patient language and need for interpreter reviewed:: Yes Do you feel safe going back to the place where you live?: Yes      Need for Family Participation in Patient Care: Yes (Comment) Care giver support system in place?: Yes (comment) Current home services: Homehealth aide Criminal Activity/Legal Involvement Pertinent to Current Situation/Hospitalization: No - Comment as needed  Activities of Daily Living   ADL Screening (condition at time of admission) Independently performs ADLs?: Yes (appropriate for developmental age) Is the patient deaf or have difficulty hearing?: No Does the patient have difficulty seeing, even when wearing glasses/contacts?: No Does the patient have difficulty concentrating, remembering, or making decisions?:  No  Permission Sought/Granted                  Emotional Assessment Appearance:: Appears stated age Attitude/Demeanor/Rapport: Gracious Affect (typically observed): Accepting Orientation: : Oriented to Self, Oriented to Place, Oriented to  Time, Oriented to Situation Alcohol / Substance Use: Not Applicable Psych Involvement: No (comment)  Admission diagnosis:  Closed anterior dislocation of left hip (HCC) [S73.035A] Closed dislocation of left hip, subsequent encounter [S73.005D] Patient Active Problem List   Diagnosis Date Noted   Closed anterior dislocation of left hip (HCC) 04/12/2023   Closed dislocation of left hip, subsequent encounter 04/12/2023   Ulcer of foot, unspecified laterality, limited to breakdown of skin (HCC) 03/23/2023   Chronic diastolic CHF (congestive heart failure) (HCC) 12/09/2022   Acute encephalopathy 12/07/2022   Dehydration 12/07/2022   Acute prerenal azotemia 12/07/2022   Leukocytosis 12/07/2022   Chronic hyponatremia 12/07/2022   ILD (interstitial lung disease) (HCC) 10/20/2022   CAP (community acquired pneumonia) 06/12/2022   Chronic heart failure with preserved ejection fraction (HFpEF) (HCC) 04/07/2022   Advanced care planning/counseling discussion 04/07/2022   Chronic venous stasis dermatitis of both lower extremities 04/07/2022   Malaise and fatigue 07/15/2021   Primary osteoarthritis 04/20/2021   Inflammatory polyarthropathy (HCC) 04/20/2021   Hyperuricemia 04/20/2021   Generalized weakness 04/20/2021   Chronic kidney disease, stage 3 unspecified (HCC) 04/20/2021   Adjustment disorder with mixed disturbance of emotions and conduct 01/20/2021  Severe recurrent major depression without psychotic features (HCC) 01/19/2021   Pincer nail deformity 12/01/2020   Iron deficiency anemia 03/24/2020   Clavi 02/19/2020   Lumbar radiculopathy 06/17/2019   Pain due to onychomycosis of toenails of both feet 05/14/2019   Mixed incontinence  03/10/2019   Cervical spondylosis 06/07/2018   DDD (degenerative disc disease), cervical 05/20/2018   History of total knee replacement, left 04/13/2018   History of total knee replacement, right 04/13/2018   History of revision of total replacement of left hip joint 04/13/2018   Gait abnormality 09/08/2017   Chronic cystitis 07/05/2017   Dysuria 07/05/2017   Anemia of chronic disease 09/22/2015   h/o pericarditis    Essential hypertension    Aortic insufficiency    Posterior vitreous detachment, bilateral 06/05/2015   Left wrist pain 05/16/2015   Triquetral chip fracture, left, closed, initial encounter 05/16/2015   Primary osteoarthritis of first carpometacarpal joint of right hand 02/11/2015   Primary osteoarthritis of right wrist 02/11/2015   Arthropathy 11/25/2014   Multiple skin tears 11/25/2014   Hip dislocation, left (HCC) 12/05/2013   Trochanteric bursitis of left hip 11/24/2012   Aneurysm of thoracic aorta (HCC)    Nonexudative age-related macular degeneration 04/22/2011   Posterior capsular opacification, right 04/22/2011   Status post intraocular lens implant 04/22/2011   PCP:  Emilio Aspen, MD Pharmacy:   [facility] - [place], [place] - [address] [place] [address] Phone: [phone] Fax: [phone]     Social Drivers of Health (SDOH) Social History: SDOH Screenings   Food Insecurity: Patient Declined (04/13/2023)  Housing: Patient Declined (04/13/2023)  Transportation Needs: Patient Declined (04/13/2023)  Utilities: Patient Declined (04/13/2023)  Alcohol Screen: Low Risk  (01/19/2021)  Depression (PHQ2-9): Low Risk  (06/10/2021)  Social Connections: Patient Declined (04/13/2023)  Tobacco Use: Medium Risk (04/12/2023)   SDOH Interventions:     Readmission Risk Interventions     No data to display

## 2023-04-13 NOTE — Care Management Obs Status (Signed)
MEDICARE OBSERVATION STATUS NOTIFICATION   Patient Details  Name: JIYA KISSINGER MRN: [id number] Date of Birth: 11-13-1929   Medicare Observation Status Notification Given:       Howell Rucks, RN 04/13/2023, 9:55 AM

## 2023-04-13 NOTE — Hospital Course (Addendum)
Brief Narrative:   89 year old with history of left hip dislocation, multiple allergies to antibiotics, anemia of chronic disease, CKD stage II, GERD, myocarditis, thoracic aneurysm presented today with left hip dislocation and pain while getting off commode.  Patient was admitted to orthopedic surgery as ED was unsuccessful in reducing her left hip.  Patient went to the OR for closed reduction on 1/28.  Medical team was consulted for multiple comorbidity management. Medically stable awaiting SNF placement  Assessment & Plan:  Principal Problem:   Closed anterior dislocation of left hip (HCC) Active Problems:   Chronic heart failure with preserved ejection fraction (HFpEF) (HCC)   Essential hypertension   Anemia of chronic disease   Left hip dislocation - Status post closed reduction by orthopedic on 1/28.  Management per their service  Congestive heart failure with preserved ejection fraction - Continue home medications  CKD stage II - Creatinine stable at 0.98.  Anemia of chronic disease - On daily iron supplements.  Bowel regimen.  Hemoglobin is stable around 8.5  Essential hypertension -Continue Coreg, Cardizem.  IV as needed  Patient remains stable from medical standpoint.  Final disposition per primary team.   Subjective: Laying in the bed no complaints Awaiting SNF placement  Examination:  General exam: Appears calm and comfortable  Respiratory system: Clear to auscultation. Respiratory effort normal. Cardiovascular system: S1 & S2 heard, RRR. No JVD, murmurs, rubs, gallops or clicks. No pedal edema. Gastrointestinal system: Abdomen is nondistended, soft and nontender. No organomegaly or masses felt. Normal bowel sounds heard. Central nervous system: Alert and oriented. No focal neurological deficits. Extremities: Symmetric 5 x 5 power. Skin: No rashes, lesions or ulcers.  Psychiatry: Judgement and insight appear normal. Mood & affect appropriate.

## 2023-04-13 NOTE — Assessment & Plan Note (Addendum)
Vitals:   04/12/23 1830 04/12/23 1845 04/12/23 1900 04/12/23 1915  BP: (!) 154/75 (!) 153/75 (!) 151/72 135/73   04/12/23 1930 04/12/23 1945 04/12/23 2000 04/12/23 2015  BP: 128/61 (!) 161/92 (!) 143/73 (!) 160/73   04/12/23 2030 04/12/23 2045 04/12/23 2100 04/12/23 2212  BP: 122/73 (!) 130/90 112/63 132/75  Patient is a lot of blood pressure variability suspect we can discontinue or reduce doses of her blood pressure meds currently we will hold Lasix and losartan and continue diltiazem and Coreg.

## 2023-04-13 NOTE — Evaluation (Signed)
Physical Therapy Evaluation Patient Details Name: [name] MRN: [id number] DOB: [birth year] Today's Date: 04/13/2023  History of Present Illness  89 year old with history of multiple Left hip surgeries,  dislocations, and reductions,  multiple allergies to antibiotics, anemia of chronic disease, CKD stage II, GERD, myocarditis, thoracic aneurysm presented today with left hip dislocation and pain while getting off commode.  Patient was admitted to orthopedic surgery as ED was unsuccessful in reducing her left hip.  Patient went to the OR for closed reduction on 1/28.  Medical team was consulted for multiple comorbidity management.  Clinical Impression  Pt admitted with above diagnosis.  Pt sleeping most of day per RN, awake and alert at time of PT eval; pt reports independence at her baseline, ambulates with rollator; pt noted to have skin tears upper posterior lateral thigh and lower leg with blood on sheets and KI. RN aware. Placed pillow case between skin and KI as pt LLE weeping, decr skin integrity bil LEs, edema noted as well.   Pt was mod assist for mobility today, difficulty adhering to THP. Pt would likely benefit from continued  follow up therapy, <3 hours/day at d/c Pt reports she went to rehab after her last hip dislocation. Pt in process of transitioning to ALF from ILF at [facility], she states it will be 2 wks or more until her "spot" is ready.    Pt currently with functional limitations due to the deficits listed below (see PT Problem List). Pt will benefit from acute skilled PT to increase their independence and safety with mobility to allow discharge.           If plan is discharge home, recommend the following: A lot of help with walking and/or transfers;A lot of help with bathing/dressing/bathroom;Assist for transportation   Can travel by private vehicle   No    Equipment Recommendations None recommended by PT  Recommendations for Other Services        Functional Status Assessment Patient has had a recent decline in their functional status and demonstrates the ability to make significant improvements in function in a reasonable and predictable amount of time.     Precautions / Restrictions Precautions Precautions: Fall;Posterior Hip Precaution Comments: uses O2 at baseline Restrictions Weight Bearing Restrictions Per Provider Order: No Other Position/Activity Restrictions: WBAT      Mobility  Bed Mobility Overal bed mobility: Needs Assistance Bed Mobility: Supine to Sit     Supine to sit: Mod assist     General bed mobility comments: assist to scoot laterlally, progress LEs off bed and elevate trun; multi-modal cues throughout for technique and THP    Transfers Overall transfer level: Needs assistance Equipment used: Rolling walker (2 wheels) Transfers: Sit to/from Stand Sit to Stand: Mod assist           General transfer comment: cues for hand placement and LLE position- THP    Ambulation/Gait Ambulation/Gait assistance: Min assist, Mod assist Gait Distance (Feet): 7 Feet Assistive device: Rolling walker (2 wheels) Gait Pattern/deviations: Step-to pattern       General Gait Details: assist to balance, cues for RW safety and sequence. assist to steady, fatigued easily needing seated rest  Stairs            Wheelchair Mobility     Tilt Bed    Modified Rankin (Stroke Patients Only)       Balance Overall balance assessment: Needs assistance, History of Falls Sitting-balance support: Feet supported, No upper extremity supported Sitting balance-Leahy  Scale: Fair     Standing balance support: Bilateral upper extremity supported, During functional activity, Reliant on assistive device for balance Standing balance-Leahy Scale: Poor                               Pertinent Vitals/Pain Pain Assessment Pain Assessment: Faces Faces Pain Scale: Hurts little more Pain Location: L  hip Pain Descriptors / Indicators: Discomfort, Grimacing Pain Intervention(s): Limited activity within patient's tolerance, Monitored during session, Premedicated before session    Home Living Family/patient expects to be discharged to:: Private residence Living Arrangements: Alone   Type of Home: Independent living facility Home Access: Level entry;Elevator       Home Layout: One level Home Equipment: Agricultural consultant (2 wheels);Rollator (4 wheels);Grab bars - toilet;Grab bars - tub/shower;Shower seat - built Museum/gallery curator Comments: pt from ILF - [facility], awaiting transition to ALF, pt reports will be at least 2 wks    Prior Function Prior Level of Function : Needs assist             Mobility Comments: mod I with rollator for mobility in apartment and facility, mod I for ADLs, self care tasks ADLs Comments: [facility] provides meals, housekeeping, and transportation. Pt does own laundry.     Extremity/Trunk Assessment   Upper Extremity Assessment Upper Extremity Assessment: Overall WFL for tasks assessed    Lower Extremity Assessment Lower Extremity Assessment: LLE deficits/detail LLE: Unable to fully assess due to pain;Unable to fully assess due to immobilization       Communication   Communication Communication: No apparent difficulties;Hearing impairment  Cognition Arousal: Alert Behavior During Therapy: WFL for tasks assessed/performed Overall Cognitive Status: Within Functional Limits for tasks assessed                                          General Comments      Exercises General Exercises - Lower Extremity Ankle Circles/Pumps: PROM, Both, 10 reps   Assessment/Plan    PT Assessment Patient needs continued PT services  PT Problem List Decreased strength;Decreased activity tolerance;Decreased balance;Decreased knowledge of use of DME;Decreased mobility;Pain;Decreased knowledge of precautions       PT  Treatment Interventions DME instruction;Therapeutic exercise;Gait training;Functional mobility training;Therapeutic activities;Patient/family education;Balance training    PT Goals (Current goals can be found in the Care Plan section)  Acute Rehab PT Goals PT Goal Formulation: With patient Time For Goal Achievement: 04/27/23 Potential to Achieve Goals: Good    Frequency Min 1X/week     Co-evaluation               AM-PAC PT "6 Clicks" Mobility  Outcome Measure Help needed turning from your back to your side while in a flat bed without using bedrails?: A Lot Help needed moving from lying on your back to sitting on the side of a flat bed without using bedrails?: A Lot Help needed moving to and from a bed to a chair (including a wheelchair)?: A Lot Help needed standing up from a chair using your arms (e.g., wheelchair or bedside chair)?: A Lot Help needed to walk in hospital room?: A Lot Help needed climbing 3-5 steps with a railing? : Total 6 Click Score: 11    End of Session Equipment Utilized During Treatment: Gait belt Activity Tolerance: Patient limited by fatigue Patient left: in  chair;with call bell/phone within reach;with chair alarm set Nurse Communication: Mobility status PT Visit Diagnosis: Other abnormalities of gait and mobility (R26.89);History of falling (Z91.81)    Time: 2130-8657 PT Time Calculation (min) (ACUTE ONLY): 29 min   Charges:   PT Evaluation $PT Eval Low Complexity: 1 Low PT Treatments $Gait Training: 8-22 mins PT General Charges $$ ACUTE PT VISIT: 1 Visit         Darrow Barreiro, PT  Acute Rehab Dept [facility]) [phone]  04/13/2023   [facility] 04/13/2023, 5:03 PM

## 2023-04-13 NOTE — Plan of Care (Signed)
  Problem: Education: Goal: Knowledge of General Education information will improve Description: Including pain rating scale, medication(s)/side effects and non-pharmacologic comfort measures Outcome: Progressing   Problem: Activity: Goal: Risk for activity intolerance will decrease Outcome: Progressing   Problem: Pain Managment: Goal: General experience of comfort will improve and/or be controlled Outcome: Progressing

## 2023-04-13 NOTE — Discharge Summary (Cosign Needed Addendum)
Physician Discharge Summary  Patient ID: [name] MRN: [id number] DOB/AGE: Mar 31, 1929 89 y.o.  Admit date: 04/12/2023 Discharge date: 04/15/2023  Admission Diagnoses: closed L hip dislocation; HTN, CHF, anemia of chronic disease, istory of aortic aneurysm, pericarditis, aortic insufficiency, gait abnormality, cervical spondylosis, chronic cystitis, degenerative disc disease, dysuria, macular degeneration, and history of previous left hip dislocations.  Discharge Diagnoses:  Principal Problem:   Closed anterior dislocation of left hip (HCC) Active Problems:   Essential hypertension   Anemia of chronic disease   Chronic heart failure with preserved ejection fraction (HFpEF) (HCC)   Closed dislocation of left hip, subsequent encounter Same as above  Discharged Condition: stable  Hospital Course: 89 year old patient presented to [facility] emergency department on April 12, 2023 after sustaining recurrent dislocation of left hip at the skilled nursing facility that she resides at.  Plain films were obtained.  Dr. Toni Arthurs was then consulted.  The patient was taken to the operative room for closed reduction of the dislocated hip.  She tolerated the procedure well but any complications.  She was admitted to the hospital for pain control and to work with therapy.  The hospitalist service was consulted to help manage her other medical comorbidities. She tolerated her stay well without any complications.  She is to be discharged to SNF per therapy recommendation.  Consults:  Hospitalist  Significant Diagnostic Studies: radiology: X-Ray: For diagnostic purposes and to confirm appropriate reduction.  Treatments: IV hydration, analgesia: acetaminophen, Morphine, and fentanyl, cardiac meds: carvedilol and diltiazem, anticoagulation: heparin, and surgery: as stated above.  Discharge Exam: Blood pressure (!) 159/108, pulse 99, temperature 98.1 F (36.7 C), resp. rate 18, height 5\' 4"   (1.626 m), weight 57.3 kg, SpO2 100%. General: WDWN patient in NAD. Psych:  Appropriate mood and affect. Neuro:  A&O x 3, Moving all extremities, sensation intact to light touch HEENT:  EOMs intact Chest:  Even non-labored respirations Skin:  C/D/I with KI, no rashes or lesions Extremities: warm/dry, mild edema to L hip, erythema or echymosis.  No lymphadenopathy. Pulses: Popliteus 2+ MSK:  ROM: TKE, MMT: able to perform quad set  Disposition: Discharge disposition: 03-Skilled Nursing Facility       Discharge Instructions     Call MD / Call 911   Complete by: As directed    If you experience chest pain or shortness of breath, CALL 911 and be transported to the hospital emergency room.  If you develope a fever above 101 F, pus (white drainage) or increased drainage or redness at the wound, or calf pain, call your surgeon's office.   Call MD / Call 911   Complete by: As directed    If you experience chest pain or shortness of breath, CALL 911 and be transported to the hospital emergency room.  If you develope a fever above 101 F, pus (white drainage) or increased drainage or redness at the wound, or calf pain, call your surgeon's office.   Call MD / Call 911   Complete by: As directed    If you experience chest pain or shortness of breath, CALL 911 and be transported to the hospital emergency room.  If you develope a fever above 101 F, pus (white drainage) or increased drainage or redness at the wound, or calf pain, call your surgeon's office.   Constipation Prevention   Complete by: As directed    Drink plenty of fluids.  Prune juice may be helpful.  You may use a stool softener, such as  Colace (over the counter) 100 mg twice a day.  Use MiraLax (over the counter) for constipation as needed.   Constipation Prevention   Complete by: As directed    Drink plenty of fluids.  Prune juice may be helpful.  You may use a stool softener, such as Colace (over the counter) 100 mg twice a day.   Use MiraLax (over the counter) for constipation as needed.   Constipation Prevention   Complete by: As directed    Drink plenty of fluids.  Prune juice may be helpful.  You may use a stool softener, such as Colace (over the counter) 100 mg twice a day.  Use MiraLax (over the counter) for constipation as needed.   Diet - low sodium heart healthy   Complete by: As directed    Diet - low sodium heart healthy   Complete by: As directed    Diet - low sodium heart healthy   Complete by: As directed    Increase activity slowly as tolerated   Complete by: As directed    Increase activity slowly as tolerated   Complete by: As directed    Increase activity slowly as tolerated   Complete by: As directed    Post-operative opioid taper instructions:   Complete by: As directed    POST-OPERATIVE OPIOID TAPER INSTRUCTIONS: It is important to wean off of your opioid medication as soon as possible. If you do not need pain medication after your surgery it is ok to stop day one. Opioids include: Codeine, Hydrocodone(Norco, Vicodin), Oxycodone(Percocet, oxycontin) and hydromorphone amongst others.  Long term and even short term use of opiods can cause: Increased pain response Dependence Constipation Depression Respiratory depression And more.  Withdrawal symptoms can include Flu like symptoms Nausea, vomiting And more Techniques to manage these symptoms Hydrate well Eat regular healthy meals Stay active Use relaxation techniques(deep breathing, meditating, yoga) Do Not substitute Alcohol to help with tapering If you have been on opioids for less than two weeks and do not have pain than it is ok to stop all together.  Plan to wean off of opioids This plan should start within one week post op of your joint replacement. Maintain the same interval or time between taking each dose and first decrease the dose.  Cut the total daily intake of opioids by one tablet each day Next start to increase the  time between doses. The last dose that should be eliminated is the evening dose.      Post-operative opioid taper instructions:   Complete by: As directed    POST-OPERATIVE OPIOID TAPER INSTRUCTIONS: It is important to wean off of your opioid medication as soon as possible. If you do not need pain medication after your surgery it is ok to stop day one. Opioids include: Codeine, Hydrocodone(Norco, Vicodin), Oxycodone(Percocet, oxycontin) and hydromorphone amongst others.  Long term and even short term use of opiods can cause: Increased pain response Dependence Constipation Depression Respiratory depression And more.  Withdrawal symptoms can include Flu like symptoms Nausea, vomiting And more Techniques to manage these symptoms Hydrate well Eat regular healthy meals Stay active Use relaxation techniques(deep breathing, meditating, yoga) Do Not substitute Alcohol to help with tapering If you have been on opioids for less than two weeks and do not have pain than it is ok to stop all together.  Plan to wean off of opioids This plan should start within one week post op of your joint replacement. Maintain the same interval or time between taking  each dose and first decrease the dose.  Cut the total daily intake of opioids by one tablet each day Next start to increase the time between doses. The last dose that should be eliminated is the evening dose.      Post-operative opioid taper instructions:   Complete by: As directed    POST-OPERATIVE OPIOID TAPER INSTRUCTIONS: It is important to wean off of your opioid medication as soon as possible. If you do not need pain medication after your surgery it is ok to stop day one. Opioids include: Codeine, Hydrocodone(Norco, Vicodin), Oxycodone(Percocet, oxycontin) and hydromorphone amongst others.  Long term and even short term use of opiods can cause: Increased pain response Dependence Constipation Depression Respiratory depression And  more.  Withdrawal symptoms can include Flu like symptoms Nausea, vomiting And more Techniques to manage these symptoms Hydrate well Eat regular healthy meals Stay active Use relaxation techniques(deep breathing, meditating, yoga) Do Not substitute Alcohol to help with tapering If you have been on opioids for less than two weeks and do not have pain than it is ok to stop all together.  Plan to wean off of opioids This plan should start within one week post op of your joint replacement. Maintain the same interval or time between taking each dose and first decrease the dose.  Cut the total daily intake of opioids by one tablet each day Next start to increase the time between doses. The last dose that should be eliminated is the evening dose.      Weight bearing as tolerated   Complete by: As directed    Knee immobilizer on at all times unless bathing with assistance.   Laterality: left   Extremity: Lower   Weight bearing as tolerated   Complete by: As directed    In knee immobilizer   Laterality: left   Extremity: Lower   Weight bearing as tolerated   Complete by: As directed    In knee immoblilizer   Laterality: left   Extremity: Lower      Allergies as of 04/15/2023       Reactions   Cephalexin Other (See Comments)   Pt states this caused eye problems and STROKE-LIKE SYMPTOMS!!   Shellfish-derived Products Hives, Rash   Clindamycin/lincomycin Rash   Lincomycin Rash   Clindamycin Hcl Other (See Comments)   "Per pharmacist"   Fosfomycin Other (See Comments)   Disorientation   Levofloxacin Other (See Comments)   Reaction not recalled by the patient   Penicillamine Other (See Comments)   Reaction not recalled by the patient   Sugar-protein-starch Other (See Comments)   Sugary foods cause legs to cramp   Tape Other (See Comments)   Please use paper tape or Coban Wrap; patient's skin tears VERY easily!!   Ciprofloxacin Rash   Sulfa Antibiotics Hives, Rash    Sulfites Hives, Swelling, Rash        Medication List     STOP taking these medications    doxycycline 100 MG tablet Commonly known as: VIBRA-TABS       TAKE these medications    allopurinol 300 MG tablet Commonly known as: ZYLOPRIM Take 300 mg by mouth daily.   amoxicillin 500 MG capsule Commonly known as: AMOXIL Take 2,000 mg by mouth See admin instructions. Take 2,000 mg by mouth one hour prior to dental procedures   carvedilol 3.125 MG tablet Commonly known as: COREG Take 3.125 mg by mouth 2 (two) times daily with a meal.   diltiazem 120 MG  24 hr capsule Commonly known as: CARDIZEM CD Take 120 mg by mouth daily.   FeroSul 325 (65 Fe) MG tablet Generic drug: ferrous sulfate Take 650 mg by mouth daily with breakfast.   furosemide 20 MG tablet Commonly known as: Lasix Take 1 tablet (20 mg total) by mouth every other day.   hydrocerin Crea Apply 1 Application topically 2 (two) times daily. Apply to intact skin of bilateral legs and feet twice daily. DO NOT PLACE IN BETWEEN TOES.   losartan 25 MG tablet Commonly known as: COZAAR Take 25 mg by mouth daily.   melatonin 3 MG Tabs tablet Take 1 tablet (3 mg total) by mouth at bedtime as needed (insomnia).   Mitigare 0.6 MG Caps Generic drug: Colchicine Take 0.6 mg by mouth every other day.   mupirocin ointment 2 % Commonly known as: BACTROBAN Apply 1 Application topically 2 (two) times daily.   OVER THE COUNTER MEDICATION Take 3 capsules by mouth daily. Balance of Nature - Fruits   OVER THE COUNTER MEDICATION Take 3 capsules by mouth daily. Balance of Nature - Veggies   OVER THE COUNTER MEDICATION Take 1 Scoop by mouth daily. AG1 Athletic Greens   sodium chloride 1 g tablet Take 1 g by mouth daily.   triamcinolone cream 0.1 % Commonly known as: KENALOG Apply topically 3 (three) times daily. To areas of psoariatic rash. Do not apply to face. What changed:  how much to take when to take  this reasons to take this   TYLENOL 500 MG tablet Generic drug: acetaminophen Take 500-1,000 mg by mouth every 6 (six) hours as needed for mild pain or headache.   Vitamin B Complex Tabs Take 1 tablet by mouth daily with breakfast.   Vitamin D3 25 MCG (1000 UT) Caps Take 1,000 Units by mouth daily.               Discharge Care Instructions  (From admission, onward)           Start     Ordered   04/15/23 0000  Weight bearing as tolerated       Comments: In knee immoblilizer  Question Answer Comment  Laterality left   Extremity Lower      04/15/23 1154   04/13/23 0000  Weight bearing as tolerated       Comments: In knee immobilizer  Question Answer Comment  Laterality left   Extremity Lower      04/13/23 1118   04/12/23 0000  Weight bearing as tolerated       Comments: Knee immobilizer on at all times unless bathing with assistance.  Question Answer Comment  Laterality left   Extremity Lower      04/12/23 1727            Follow-up Information     Ollen Gross, MD Follow up in 2 week(s).   Specialty: Orthopedic Surgery Contact information: [address] [place] [address] [phone]                 Signed: Lolly Mustache Office:  [phone]

## 2023-04-13 NOTE — Assessment & Plan Note (Addendum)
Status post reduction in the OR today. Pain management as needed IV as needed. Physical therapy as needed.

## 2023-04-13 NOTE — Progress Notes (Signed)
Subjective: 1 Day Post-Op Procedure(s) (LRB): CLOSED MANIPULATION HIP (Left)  Patient reports pain as significantly improved after closed reduction.  Tolerating POs well.  Notes that she wants go home today.  Objective:   VITALS:  Temp:  [97.5 F (36.4 C)-98.7 F (37.1 C)] 98.7 F (37.1 C) (01/29 1004) Pulse Rate:  [77-99] 88 (01/29 1004) Resp:  [7-26] 16 (01/29 1004) BP: (112-198)/(61-100) 128/62 (01/29 1004) SpO2:  [88 %-100 %] 98 % (01/29 1004) FiO2 (%):  [14 %] 14 % (01/28 1532)  General: WDWN patient in NAD. Psych:  Appropriate mood and affect. Neuro:  A&O x 3, Moving all extremities, sensation intact to light touch HEENT:  EOMs intact Chest:  Even non-labored respirations Skin:   C/D/I KI intact to L LE, no rashes or lesions Extremities: warm/dry, mild edema L hip, erythema or echymosis.  No lymphadenopathy. Pulses: Popliteus 2+ MSK:  ROM: TKE with KI, MMT: able to perform quad set, (-) Homan's    LABS Recent Labs    04/12/23 1024 04/13/23 0340  HGB 8.6* 8.1*  WBC 7.4 7.5  PLT 366 351   Recent Labs    04/12/23 1024 04/13/23 0340  NA 135 132*  K 4.5 4.4  CL 101 99  CO2 23 23  BUN 24* 29*  CREATININE 0.81 0.98  GLUCOSE 86 158*   No results for input(s): "LABPT", "INR" in the last 72 hours.   Assessment/Plan: 1 Day Post-Op Procedure(s) (LRB): CLOSED MANIPULATION HIP (Left)  WBAT L LE in KI Plan to D/C back home to SNF today after working with therapy. Plan for 2 week outpatient f/u visit with Dr. Lequita Halt.  Alfredo Martinez PA-C [facility] Office:  [phone]

## 2023-04-14 DIAGNOSIS — M25552 Pain in left hip: Secondary | ICD-10-CM | POA: Diagnosis present

## 2023-04-14 DIAGNOSIS — I5022 Chronic systolic (congestive) heart failure: Secondary | ICD-10-CM | POA: Diagnosis not present

## 2023-04-14 DIAGNOSIS — Z90710 Acquired absence of both cervix and uterus: Secondary | ICD-10-CM | POA: Diagnosis not present

## 2023-04-14 DIAGNOSIS — I130 Hypertensive heart and chronic kidney disease with heart failure and stage 1 through stage 4 chronic kidney disease, or unspecified chronic kidney disease: Secondary | ICD-10-CM | POA: Diagnosis present

## 2023-04-14 DIAGNOSIS — Z881 Allergy status to other antibiotic agents status: Secondary | ICD-10-CM | POA: Diagnosis not present

## 2023-04-14 DIAGNOSIS — X500XXA Overexertion from strenuous movement or load, initial encounter: Secondary | ICD-10-CM | POA: Diagnosis not present

## 2023-04-14 DIAGNOSIS — N182 Chronic kidney disease, stage 2 (mild): Secondary | ICD-10-CM | POA: Diagnosis present

## 2023-04-14 DIAGNOSIS — Z882 Allergy status to sulfonamides status: Secondary | ICD-10-CM | POA: Diagnosis not present

## 2023-04-14 DIAGNOSIS — R2681 Unsteadiness on feet: Secondary | ICD-10-CM | POA: Diagnosis not present

## 2023-04-14 DIAGNOSIS — M6281 Muscle weakness (generalized): Secondary | ICD-10-CM | POA: Diagnosis not present

## 2023-04-14 DIAGNOSIS — R41841 Cognitive communication deficit: Secondary | ICD-10-CM | POA: Diagnosis not present

## 2023-04-14 DIAGNOSIS — R300 Dysuria: Secondary | ICD-10-CM | POA: Diagnosis not present

## 2023-04-14 DIAGNOSIS — D638 Anemia in other chronic diseases classified elsewhere: Secondary | ICD-10-CM | POA: Diagnosis not present

## 2023-04-14 DIAGNOSIS — Z823 Family history of stroke: Secondary | ICD-10-CM | POA: Diagnosis not present

## 2023-04-14 DIAGNOSIS — Z79899 Other long term (current) drug therapy: Secondary | ICD-10-CM | POA: Diagnosis not present

## 2023-04-14 DIAGNOSIS — Z66 Do not resuscitate: Secondary | ICD-10-CM | POA: Diagnosis present

## 2023-04-14 DIAGNOSIS — R5383 Other fatigue: Secondary | ICD-10-CM | POA: Diagnosis not present

## 2023-04-14 DIAGNOSIS — Z85828 Personal history of other malignant neoplasm of skin: Secondary | ICD-10-CM | POA: Diagnosis not present

## 2023-04-14 DIAGNOSIS — H3530 Unspecified macular degeneration: Secondary | ICD-10-CM | POA: Diagnosis present

## 2023-04-14 DIAGNOSIS — Z96653 Presence of artificial knee joint, bilateral: Secondary | ICD-10-CM | POA: Diagnosis present

## 2023-04-14 DIAGNOSIS — D631 Anemia in chronic kidney disease: Secondary | ICD-10-CM | POA: Diagnosis present

## 2023-04-14 DIAGNOSIS — I10 Essential (primary) hypertension: Secondary | ICD-10-CM | POA: Diagnosis not present

## 2023-04-14 DIAGNOSIS — Z87891 Personal history of nicotine dependence: Secondary | ICD-10-CM | POA: Diagnosis not present

## 2023-04-14 DIAGNOSIS — Z751 Person awaiting admission to adequate facility elsewhere: Secondary | ICD-10-CM | POA: Diagnosis not present

## 2023-04-14 DIAGNOSIS — Y92031 Bathroom in apartment as the place of occurrence of the external cause: Secondary | ICD-10-CM | POA: Diagnosis not present

## 2023-04-14 DIAGNOSIS — I5032 Chronic diastolic (congestive) heart failure: Secondary | ICD-10-CM | POA: Diagnosis present

## 2023-04-14 DIAGNOSIS — S73035D Other anterior dislocation of left hip, subsequent encounter: Secondary | ICD-10-CM | POA: Diagnosis not present

## 2023-04-14 DIAGNOSIS — Z888 Allergy status to other drugs, medicaments and biological substances status: Secondary | ICD-10-CM | POA: Diagnosis not present

## 2023-04-14 DIAGNOSIS — M519 Unspecified thoracic, thoracolumbar and lumbosacral intervertebral disc disorder: Secondary | ICD-10-CM | POA: Diagnosis not present

## 2023-04-14 DIAGNOSIS — Z7401 Bed confinement status: Secondary | ICD-10-CM | POA: Diagnosis not present

## 2023-04-14 DIAGNOSIS — Y792 Prosthetic and other implants, materials and accessory orthopedic devices associated with adverse incidents: Secondary | ICD-10-CM | POA: Diagnosis present

## 2023-04-14 DIAGNOSIS — T84021A Dislocation of internal left hip prosthesis, initial encounter: Secondary | ICD-10-CM | POA: Diagnosis present

## 2023-04-14 DIAGNOSIS — Z6821 Body mass index (BMI) 21.0-21.9, adult: Secondary | ICD-10-CM | POA: Diagnosis not present

## 2023-04-14 DIAGNOSIS — Z4789 Encounter for other orthopedic aftercare: Secondary | ICD-10-CM | POA: Diagnosis not present

## 2023-04-14 DIAGNOSIS — S73005D Unspecified dislocation of left hip, subsequent encounter: Secondary | ICD-10-CM | POA: Diagnosis not present

## 2023-04-14 DIAGNOSIS — R64 Cachexia: Secondary | ICD-10-CM | POA: Diagnosis present

## 2023-04-14 DIAGNOSIS — M24452 Recurrent dislocation, left hip: Secondary | ICD-10-CM | POA: Diagnosis present

## 2023-04-14 DIAGNOSIS — R2689 Other abnormalities of gait and mobility: Secondary | ICD-10-CM | POA: Diagnosis not present

## 2023-04-14 DIAGNOSIS — Z8249 Family history of ischemic heart disease and other diseases of the circulatory system: Secondary | ICD-10-CM | POA: Diagnosis not present

## 2023-04-14 NOTE — Plan of Care (Signed)
Problem: Education: Goal: Knowledge of General Education information will improve Description Including pain rating scale, medication(s)/side effects and non-pharmacologic comfort measures Outcome: Progressing   Problem: Activity: Goal: Risk for activity intolerance will decrease Outcome: Progressing   Problem: Safety: Goal: Ability to remain free from injury will improve Outcome: Progressing

## 2023-04-14 NOTE — TOC Progression Note (Addendum)
Transition of Care [facility]) - Progression Note    Patient Details  Name: [name] MRN: [id number] Date of Birth: September 24, 1929  [facility] CM/SW Contact  Howell Rucks, RN Phone Number: 04/14/2023, 12:30 PM  Clinical Narrative: PT eval completed, recommendation for short term rehab/SNF, met with pt at bedside and spoke with pt's son, Thurmond Butts, botth agreeable, prefer [facility].     -1:15pm Confirmed with [facility] pt elgible for [facility] SNF 3-day waiver:   [name] is eligible for the waiver. Please see the attached 2025 SNF waiver list.    Gerre Scull    -2:19pm faxed out for bed offers.     Expected Discharge Plan: Home w Home Health Services Barriers to Discharge: Continued Medical Work up  Expected Discharge Plan and Services       Living arrangements for the past 2 months: Independent Living Facility Expected Discharge Date: 04/13/23                                     Social Determinants of Health (SDOH) Interventions SDOH Screenings   Food Insecurity: Patient Declined (04/13/2023)  Housing: Patient Declined (04/13/2023)  Transportation Needs: Patient Declined (04/13/2023)  Utilities: Patient Declined (04/13/2023)  Alcohol Screen: Low Risk  (01/19/2021)  Depression (PHQ2-9): Low Risk  (06/10/2021)  Social Connections: Patient Declined (04/13/2023)  Tobacco Use: Medium Risk (04/12/2023)    Readmission Risk Interventions     No data to display

## 2023-04-14 NOTE — Progress Notes (Signed)
Subjective: 2 Days Post-Op Procedure(s) (LRB): CLOSED MANIPULATION HIP (Left)  Called to patient's room.  She reports that she is doing well.  Eager to be discharged.  Objective:   VITALS:  Temp:  [97.6 F (36.4 C)-98.6 F (37 C)] 97.6 F (36.4 C) (01/30 1328) Pulse Rate:  [79-94] 79 (01/30 1328) Resp:  [16-18] 16 (01/30 1328) BP: (124-159)/(68-87) 156/87 (01/30 1328) SpO2:  [94 %-100 %] 100 % (01/30 1328)     LABS Recent Labs    04/12/23 1024 04/13/23 0340  HGB 8.6* 8.1*  WBC 7.4 7.5  PLT 366 351   Recent Labs    04/12/23 1024 04/13/23 0340  NA 135 132*  K 4.5 4.4  CL 101 99  CO2 23 23  BUN 24* 29*  CREATININE 0.81 0.98  GLUCOSE 86 158*   No results for input(s): "LABPT", "INR" in the last 72 hours.   Assessment/Plan: 2 Days Post-Op Procedure(s) (LRB): CLOSED MANIPULATION HIP (Left)  WBAT L LE in KI Disp:  PT recommends SNF Plan for 2 weke outpatient f/u visit with Dr. Lequita Halt.  Alfredo Martinez PA-C [facility] Office:  [phone]

## 2023-04-14 NOTE — NC FL2 (Addendum)
[place] MEDICAID FL2 LEVEL OF CARE FORM     IDENTIFICATION  Patient Name: [name] Birthdate: 04/06/1929 Sex: female Admission Date (Current Location): 04/12/2023  [facility] and [place] Number:  Producer, television/film/video and Address:  [facility],  [address] [place], [place] [address]      Provider Number: [id number]  Attending Physician Name and Address:  Toni Arthurs, MD  Relative Name and Phone Number:  Hiram Comber)  [phone] [facility])    Current Level of Care: Hospital Recommended Level of Care: Skilled Nursing Facility Prior Approval Number:    Date Approved/Denied:   PASRR Number: [id number]  Discharge Plan: SNF    Current Diagnoses: Patient Active Problem List   Diagnosis Date Noted   Closed anterior dislocation of left hip (HCC) 04/12/2023   Closed dislocation of left hip, subsequent encounter 04/12/2023   Ulcer of foot, unspecified laterality, limited to breakdown of skin (HCC) 03/23/2023   Chronic diastolic CHF (congestive heart failure) (HCC) 12/09/2022   Acute encephalopathy 12/07/2022   Dehydration 12/07/2022   Acute prerenal azotemia 12/07/2022   Leukocytosis 12/07/2022   Chronic hyponatremia 12/07/2022   ILD (interstitial lung disease) (HCC) 10/20/2022   CAP (community acquired pneumonia) 06/12/2022   Chronic heart failure with preserved ejection fraction (HFpEF) (HCC) 04/07/2022   Advanced care planning/counseling discussion 04/07/2022   Chronic venous stasis dermatitis of both lower extremities 04/07/2022   Malaise and fatigue 07/15/2021   Primary osteoarthritis 04/20/2021   Inflammatory polyarthropathy (HCC) 04/20/2021   Hyperuricemia 04/20/2021   Generalized weakness 04/20/2021   Chronic kidney disease, stage 3 unspecified (HCC) 04/20/2021   Adjustment disorder with mixed disturbance of emotions and conduct 01/20/2021   Severe recurrent major depression without psychotic features (HCC) 01/19/2021   Pincer nail  deformity 12/01/2020   Iron deficiency anemia 03/24/2020   Clavi 02/19/2020   Lumbar radiculopathy 06/17/2019   Pain due to onychomycosis of toenails of both feet 05/14/2019   Mixed incontinence 03/10/2019   Cervical spondylosis 06/07/2018   DDD (degenerative disc disease), cervical 05/20/2018   History of total knee replacement, left 04/13/2018   History of total knee replacement, right 04/13/2018   History of revision of total replacement of left hip joint 04/13/2018   Gait abnormality 09/08/2017   Chronic cystitis 07/05/2017   Dysuria 07/05/2017   Anemia of chronic disease 09/22/2015   h/o pericarditis    Essential hypertension    Aortic insufficiency    Posterior vitreous detachment, bilateral 06/05/2015   Left wrist pain 05/16/2015   Triquetral chip fracture, left, closed, initial encounter 05/16/2015   Primary osteoarthritis of first carpometacarpal joint of right hand 02/11/2015   Primary osteoarthritis of right wrist 02/11/2015   Arthropathy 11/25/2014   Multiple skin tears 11/25/2014   Hip dislocation, left (HCC) 12/05/2013   Trochanteric bursitis of left hip 11/24/2012   Aneurysm of thoracic aorta (HCC)    Nonexudative age-related macular degeneration 04/22/2011   Posterior capsular opacification, right 04/22/2011   Status post intraocular lens implant 04/22/2011    Orientation RESPIRATION BLADDER Height & Weight     Self, Time, Situation, Place  Normal Continent, External catheter Weight: 57.3 kg Height:  5\' 4"  (162.6 cm)  BEHAVIORAL SYMPTOMS/MOOD NEUROLOGICAL BOWEL NUTRITION STATUS      Continent Diet  AMBULATORY STATUS COMMUNICATION OF NEEDS Skin   Limited Assist Verbally Normal                       Personal Care Assistance Level  of Assistance  Bathing, Feeding, Dressing Bathing Assistance: Limited assistance Feeding assistance: Limited assistance Dressing Assistance: Limited assistance     Functional Limitations Info  Sight, Hearing, Speech  Sight Info: Adequate Hearing Info: Adequate Speech Info: Adequate    SPECIAL CARE FACTORS FREQUENCY  PT (By licensed PT), OT (By licensed OT)     PT Frequency: 5x/wk OT Frequency: 5x/wk            Contractures Contractures Info: Not present    Additional Factors Info  Code Status, Allergies, Psychotropic Code Status Info: DNR Allergies Info: Cephalexin, Shellfish-derived Products, Clindamycin/lincomycin, Lincomycin, Clindamycin Hcl, Fosfomycin, Levofloxacin, Penicillamine, Sugar-protein-starch, Tape, Ciprofloxacin, Sulfa Antibiotics, Sulfites Psychotropic Info: N/A         Current Medications (04/14/2023):  This is the current hospital active medication list Current Facility-Administered Medications  Medication Dose Route Frequency Provider Last Rate Last Admin   acetaminophen (TYLENOL) tablet 325-650 mg  325-650 mg Oral Q6H PRN Toni Arthurs, MD       allopurinol (ZYLOPRIM) tablet 300 mg  300 mg Oral Daily Toni Arthurs, MD   300 mg at 04/14/23 0929   carvedilol (COREG) tablet 3.125 mg  3.125 mg Oral BID WC Gertha Calkin, MD   3.125 mg at 04/14/23 4098   colchicine tablet 0.6 mg  0.6 mg Oral Johnell Comings, MD   0.6 mg at 04/13/23 1191   diltiazem (CARDIZEM CD) 24 hr capsule 120 mg  120 mg Oral q AM Gertha Calkin, MD   120 mg at 04/14/23 0930   docusate sodium (COLACE) capsule 100 mg  100 mg Oral BID Toni Arthurs, MD   100 mg at 04/14/23 0930   ferrous sulfate tablet 650 mg  650 mg Oral Q breakfast Toni Arthurs, MD   650 mg at 04/14/23 0930   heparin injection 5,000 Units  5,000 Units Subcutaneous Q12H Gertha Calkin, MD   5,000 Units at 04/14/23 0930   hydrALAZINE (APRESOLINE) injection 10 mg  10 mg Intravenous Q4H PRN Amin, Ankit C, MD       hydrocerin (EUCERIN) cream   Topical BID Gertha Calkin, MD   Given at 04/14/23 0930   HYDROcodone-acetaminophen (NORCO/VICODIN) 5-325 MG per tablet 1-2 tablet  1-2 tablet Oral Q4H PRN Toni Arthurs, MD   2 tablet at 04/13/23 2316    metoprolol tartrate (LOPRESSOR) injection 5 mg  5 mg Intravenous Q4H PRN Amin, Ankit C, MD       morphine (PF) 2 MG/ML injection 0.5-1 mg  0.5-1 mg Intravenous Q2H PRN Toni Arthurs, MD       mupirocin ointment (BACTROBAN) 2 % 1 Application  1 Application Topical BID Toni Arthurs, MD   1 Application at 04/14/23 0930   ondansetron (ZOFRAN) tablet 4 mg  4 mg Oral Q6H PRN Toni Arthurs, MD       Or   ondansetron [facility]) injection 4 mg  4 mg Intravenous Q6H PRN Toni Arthurs, MD       triamcinolone cream (KENALOG) 0.1 % cream   Topical TID Toni Arthurs, MD   Given at 04/14/23 0935     Discharge Medications: Please see discharge summary for a list of discharge medications.  Relevant Imaging Results:  Relevant Lab Results:   Additional Information SSN: [id number]  Howell Rucks, RN

## 2023-04-14 NOTE — Progress Notes (Signed)
PROGRESS NOTE    [name]  WUJ:[id number] DOB: 1930/02/18 DOA: 04/12/2023 PCP: Emilio Aspen, MD    Brief Narrative:   89 year old with history of left hip dislocation, multiple allergies to antibiotics, anemia of chronic disease, CKD stage II, GERD, myocarditis, thoracic aneurysm presented today with left hip dislocation and pain while getting off commode.  Patient was admitted to orthopedic surgery as ED was unsuccessful in reducing her left hip.  Patient went to the OR for closed reduction on 1/28.  Medical team was consulted for multiple comorbidity management.  Assessment & Plan:  Principal Problem:   Closed anterior dislocation of left hip (HCC) Active Problems:   Chronic heart failure with preserved ejection fraction (HFpEF) (HCC)   Essential hypertension   Anemia of chronic disease   Left hip dislocation - Status post closed reduction by orthopedic on 1/28.  Management per their service  Congestive heart failure with preserved ejection fraction - Continue home medications  CKD stage II - Creatinine stable at 0.98.  Anemia of chronic disease - On daily iron supplements.  Bowel regimen.  Hemoglobin is stable around 8.5  Essential hypertension -Continue Coreg, Cardizem.  IV as needed  Medically stable, final disposition per primary team.   Subjective: Laying in the bed no complaints  Examination:  General exam: Appears calm and comfortable  Respiratory system: Clear to auscultation. Respiratory effort normal. Cardiovascular system: S1 & S2 heard, RRR. No JVD, murmurs, rubs, gallops or clicks. No pedal edema. Gastrointestinal system: Abdomen is nondistended, soft and nontender. No organomegaly or masses felt. Normal bowel sounds heard. Central nervous system: Alert and oriented. No focal neurological deficits. Extremities: Symmetric 5 x 5 power. Skin: No rashes, lesions or ulcers.  Psychiatry: Judgement and insight appear normal. Mood & affect  appropriate.                Diet Orders (From admission, onward)     Start     Ordered   04/13/23 0000  Diet - low sodium heart healthy        04/13/23 1118   04/12/23 2352  Diet regular Room service appropriate? Yes; Fluid consistency: Thin  Diet effective now       Question Answer Comment  Room service appropriate? Yes   Fluid consistency: Thin      04/12/23 2351   04/12/23 0000  Diet - low sodium heart healthy        04/12/23 1727            Objective: Vitals:   04/13/23 1312 04/13/23 1724 04/13/23 2044 04/14/23 0605  BP: (!) 142/73 124/68 (!) 150/70 (!) 159/81  Pulse: 87 94 94 80  Resp: 17 17 18 16   Temp: 98.1 F (36.7 C) 98.6 F (37 C) 98.4 F (36.9 C)   TempSrc:   Oral   SpO2: 96% 99% 94% 100%  Weight:      Height:        Intake/Output Summary (Last 24 hours) at 04/14/2023 1117 Last data filed at 04/14/2023 0701 Gross per 24 hour  Intake 60 ml  Output 650 ml  Net -590 ml   Filed Weights   04/12/23 1037  Weight: 57.3 kg    Scheduled Meds:  allopurinol  300 mg Oral Daily   carvedilol  3.125 mg Oral BID WC   colchicine  0.6 mg Oral QODAY   diltiazem  120 mg Oral q AM   docusate sodium  100 mg Oral BID   ferrous sulfate  650 mg Oral Q breakfast   heparin injection (subcutaneous)  5,000 Units Subcutaneous Q12H   hydrocerin   Topical BID   mupirocin ointment  1 Application Topical BID   triamcinolone cream   Topical TID   Continuous Infusions:  Nutritional status     Body mass index is 21.68 kg/m.  Data Reviewed:   CBC: Recent Labs  Lab 04/12/23 1024 04/13/23 0340  WBC 7.4 7.5  NEUTROABS 4.4 5.3  HGB 8.6* 8.1*  HCT 26.9* 26.5*  MCV 84.3 86.6  PLT 366 351   Basic Metabolic Panel: Recent Labs  Lab 04/12/23 1024 04/13/23 0340  NA 135 132*  K 4.5 4.4  CL 101 99  CO2 23 23  GLUCOSE 86 158*  BUN 24* 29*  CREATININE 0.81 0.98  CALCIUM 9.0 8.6*  MG  --  2.0  PHOS  --  4.4   GFR: Estimated Creatinine Clearance: 32.3  mL/min (by C-G formula based on SCr of 0.98 mg/dL). Liver Function Tests: No results for input(s): "AST", "ALT", "ALKPHOS", "BILITOT", "PROT", "ALBUMIN" in the last 168 hours. No results for input(s): "LIPASE", "AMYLASE" in the last 168 hours. No results for input(s): "AMMONIA" in the last 168 hours. Coagulation Profile: No results for input(s): "INR", "PROTIME" in the last 168 hours. Cardiac Enzymes: No results for input(s): "CKTOTAL", "CKMB", "CKMBINDEX", "TROPONINI" in the last 168 hours. BNP (last 3 results) No results for input(s): "PROBNP" in the last 8760 hours. HbA1C: No results for input(s): "HGBA1C" in the last 72 hours. CBG: No results for input(s): "GLUCAP" in the last 168 hours. Lipid Profile: No results for input(s): "CHOL", "HDL", "LDLCALC", "TRIG", "CHOLHDL", "LDLDIRECT" in the last 72 hours. Thyroid Function Tests: No results for input(s): "TSH", "T4TOTAL", "FREET4", "T3FREE", "THYROIDAB" in the last 72 hours. Anemia Panel: No results for input(s): "VITAMINB12", "FOLATE", "FERRITIN", "TIBC", "IRON", "RETICCTPCT" in the last 72 hours. Sepsis Labs: No results for input(s): "PROCALCITON", "LATICACIDVEN" in the last 168 hours.  No results found for this or any previous visit (from the past 240 hours).       Radiology Studies: DG Pelvis Portable Result Date: 04/12/2023 CLINICAL DATA:  Post reduction of dislocated left total hip arthroplasty. EXAM: PORTABLE PELVIS 1-2 VIEWS COMPARISON:  Radiographs earlier the same date. FINDINGS: 1705 hours. Single AP view of the left hip demonstrates normal alignment of the total hip arthroplasty in this single projection. No evidence of acute fracture. The distal aspect of the femoral prosthesis is not imaged. IMPRESSION: Interval reduction of dislocated left total hip arthroplasty. Electronically Signed   By: Carey Bullocks M.D.   On: 04/12/2023 17:20   DG Hip Port Unilat W or Wo Pelvis 1 View Left Result Date: 04/12/2023 CLINICAL  DATA:  Status post attempted reduction. EXAM: DG HIP (WITH OR WITHOUT PELVIS) 1V PORT LEFT COMPARISON:  Same day. FINDINGS: There is continued superior dislocation of left hip arthroplasty. IMPRESSION: Continued superior dislocation of left hip arthroplasty. Electronically Signed   By: Lupita Raider M.D.   On: 04/12/2023 12:50           LOS: 0 days   Time spent= 35 mins    Miguel Rota, MD [facility]  If 7PM-7AM, please contact night-coverage  04/14/2023, 11:17 AM

## 2023-04-14 NOTE — Progress Notes (Signed)
Physical Therapy Treatment Patient Details Name: [name] MRN: [id number] DOB: Jun 28, 1929 Today's Date: 04/14/2023   History of Present Illness 89 year old with history of multiple Left hip surgeries,  dislocations, and reductions,  multiple allergies to antibiotics, anemia of chronic disease, CKD stage II, GERD, myocarditis, thoracic aneurysm presented today with left hip dislocation and pain while getting off commode.  Patient was admitted to orthopedic surgery as ED was unsuccessful in reducing her left hip.  Patient went to the OR for closed reduction on 1/28.  Medical team was consulted for multiple comorbidity management.    PT Comments  Pt progressing toward goals. Incr gait distance today, pt denies dizziness with position changes. Pt continues to require assist for all aspects of mobility and d/c plan for post acute rehab remains appropriate.    If plan is discharge home, recommend the following: A lot of help with walking and/or transfers;A lot of help with bathing/dressing/bathroom;Assist for transportation   Can travel by private vehicle     No  Equipment Recommendations  None recommended by PT    Recommendations for Other Services       Precautions / Restrictions Precautions Precautions: Fall;Posterior Hip Precaution Comments: uses O2 at baseline; reviewed THP Required Braces or Orthoses: Knee Immobilizer - Left Knee Immobilizer - Left:  (not specified, removed today d/t LLE weeping) Restrictions Weight Bearing Restrictions Per Provider Order: No Other Position/Activity Restrictions: WBAT     Mobility  Bed Mobility Overal bed mobility: Needs Assistance Bed Mobility: Supine to Sit     Supine to sit: Mod assist, +2 for physical assistance, +2 for safety/equipment     General bed mobility comments: assist to scoot laterlally, progress LEs off bed and elevate trunk; multi-modal cues throughout for technique and THP    Transfers Overall transfer level:  Needs assistance Equipment used: Rolling walker (2 wheels) Transfers: Sit to/from Stand Sit to Stand: Min assist, From elevated surface           General transfer comment: cues for hand placement and LLE position- THP    Ambulation/Gait Ambulation/Gait assistance: Min assist, +2 safety/equipment Gait Distance (Feet): 28 Feet Assistive device: Rolling walker (2 wheels) Gait Pattern/deviations: Step-to pattern       General Gait Details: assist to balance, cues for RW safety and sequence. assist to steady, fatigued easily needing seated rest   Stairs             Wheelchair Mobility     Tilt Bed    Modified Rankin (Stroke Patients Only)       Balance   Sitting-balance support: Feet supported, No upper extremity supported Sitting balance-Leahy Scale: Fair     Standing balance support: Bilateral upper extremity supported, During functional activity, Reliant on assistive device for balance Standing balance-Leahy Scale: Poor                              Cognition Arousal: Alert Behavior During Therapy: WFL for tasks assessed/performed Overall Cognitive Status: Within Functional Limits for tasks assessed                                          Exercises General Exercises - Lower Extremity Ankle Circles/Pumps: AROM, Both, 10 reps Quad Sets: AROM, Both, 10 reps    General Comments        Pertinent Vitals/Pain  Pain Assessment Pain Assessment: Faces Faces Pain Scale: Hurts little more Pain Location: L hip Pain Descriptors / Indicators: Discomfort, Grimacing    Home Living                          Prior Function            PT Goals (current goals can now be found in the care plan section) Acute Rehab PT Goals PT Goal Formulation: With patient Time For Goal Achievement: 04/27/23 Potential to Achieve Goals: Good Progress towards PT goals: Progressing toward goals    Frequency    Min 1X/week       PT Plan      Co-evaluation              AM-PAC PT "6 Clicks" Mobility   Outcome Measure  Help needed turning from your back to your side while in a flat bed without using bedrails?: A Lot Help needed moving from lying on your back to sitting on the side of a flat bed without using bedrails?: A Lot Help needed moving to and from a bed to a chair (including a wheelchair)?: A Little Help needed standing up from a chair using your arms (e.g., wheelchair or bedside chair)?: A Little Help needed to walk in hospital room?: A Little Help needed climbing 3-5 steps with a railing? : A Lot 6 Click Score: 15    End of Session Equipment Utilized During Treatment: Gait belt Activity Tolerance: Patient tolerated treatment well Patient left: in chair;with call bell/phone within reach;with chair alarm set Nurse Communication: Mobility status PT Visit Diagnosis: Other abnormalities of gait and mobility (R26.89);History of falling (Z91.81)     Time: 1610-9604 PT Time Calculation (min) (ACUTE ONLY): 16 min  Charges:    $Gait Training: 8-22 mins PT General Charges $$ ACUTE PT VISIT: 1 Visit                     Len Azeez, PT  Acute Rehab Dept [facility]) [phone]  04/14/2023    [facility] 04/14/2023, 11:14 AM

## 2023-04-15 DIAGNOSIS — Z4789 Encounter for other orthopedic aftercare: Secondary | ICD-10-CM | POA: Diagnosis not present

## 2023-04-15 DIAGNOSIS — I5032 Chronic diastolic (congestive) heart failure: Secondary | ICD-10-CM | POA: Diagnosis not present

## 2023-04-15 DIAGNOSIS — S73035D Other anterior dislocation of left hip, subsequent encounter: Secondary | ICD-10-CM | POA: Diagnosis not present

## 2023-04-15 DIAGNOSIS — S73005D Unspecified dislocation of left hip, subsequent encounter: Secondary | ICD-10-CM | POA: Diagnosis not present

## 2023-04-15 DIAGNOSIS — H3530 Unspecified macular degeneration: Secondary | ICD-10-CM | POA: Diagnosis not present

## 2023-04-15 DIAGNOSIS — R5381 Other malaise: Secondary | ICD-10-CM | POA: Diagnosis not present

## 2023-04-15 DIAGNOSIS — R2681 Unsteadiness on feet: Secondary | ICD-10-CM | POA: Diagnosis not present

## 2023-04-15 DIAGNOSIS — K5901 Slow transit constipation: Secondary | ICD-10-CM | POA: Diagnosis not present

## 2023-04-15 DIAGNOSIS — R41841 Cognitive communication deficit: Secondary | ICD-10-CM | POA: Diagnosis not present

## 2023-04-15 DIAGNOSIS — L309 Dermatitis, unspecified: Secondary | ICD-10-CM | POA: Diagnosis not present

## 2023-04-15 DIAGNOSIS — D638 Anemia in other chronic diseases classified elsewhere: Secondary | ICD-10-CM | POA: Diagnosis not present

## 2023-04-15 DIAGNOSIS — M519 Unspecified thoracic, thoracolumbar and lumbosacral intervertebral disc disorder: Secondary | ICD-10-CM | POA: Diagnosis not present

## 2023-04-15 DIAGNOSIS — D509 Iron deficiency anemia, unspecified: Secondary | ICD-10-CM | POA: Diagnosis not present

## 2023-04-15 DIAGNOSIS — E871 Hypo-osmolality and hyponatremia: Secondary | ICD-10-CM | POA: Diagnosis not present

## 2023-04-15 DIAGNOSIS — I10 Essential (primary) hypertension: Secondary | ICD-10-CM | POA: Diagnosis not present

## 2023-04-15 DIAGNOSIS — M25552 Pain in left hip: Secondary | ICD-10-CM | POA: Diagnosis not present

## 2023-04-15 DIAGNOSIS — M24452 Recurrent dislocation, left hip: Secondary | ICD-10-CM | POA: Diagnosis not present

## 2023-04-15 DIAGNOSIS — I5022 Chronic systolic (congestive) heart failure: Secondary | ICD-10-CM | POA: Diagnosis not present

## 2023-04-15 DIAGNOSIS — Z7401 Bed confinement status: Secondary | ICD-10-CM | POA: Diagnosis not present

## 2023-04-15 DIAGNOSIS — R300 Dysuria: Secondary | ICD-10-CM | POA: Diagnosis not present

## 2023-04-15 DIAGNOSIS — R5383 Other fatigue: Secondary | ICD-10-CM | POA: Diagnosis not present

## 2023-04-15 DIAGNOSIS — R2689 Other abnormalities of gait and mobility: Secondary | ICD-10-CM | POA: Diagnosis not present

## 2023-04-15 DIAGNOSIS — M6281 Muscle weakness (generalized): Secondary | ICD-10-CM | POA: Diagnosis not present

## 2023-04-15 NOTE — Progress Notes (Signed)
Pt got discharged to [facility] SNF via ambulance. Report given to the nurse Molly Maduro. No concerns made.

## 2023-04-15 NOTE — Plan of Care (Signed)
  Problem: Education: Goal: Knowledge of General Education information will improve Description: Including pain rating scale, medication(s)/side effects and non-pharmacologic comfort measures Outcome: Adequate for Discharge   Problem: Health Behavior/Discharge Planning: Goal: Ability to manage health-related needs will improve Outcome: Adequate for Discharge   Problem: Clinical Measurements: Goal: Ability to maintain clinical measurements within normal limits will improve Outcome: Progressing Goal: Will remain free from infection Outcome: Progressing Goal: Diagnostic test results will improve Outcome: Adequate for Discharge Goal: Respiratory complications will improve Outcome: Progressing Goal: Cardiovascular complication will be avoided Outcome: Progressing   Problem: Activity: Goal: Risk for activity intolerance will decrease Outcome: Progressing   Problem: Nutrition: Goal: Adequate nutrition will be maintained Outcome: Adequate for Discharge   Problem: Coping: Goal: Level of anxiety will decrease Outcome: Progressing   Problem: Elimination: Goal: Will not experience complications related to bowel motility Outcome: Completed/Met Goal: Will not experience complications related to urinary retention Outcome: Completed/Met   Problem: Pain Managment: Goal: General experience of comfort will improve and/or be controlled Outcome: Adequate for Discharge   Problem: Safety: Goal: Ability to remain free from injury will improve Outcome: Progressing   Problem: Skin Integrity: Goal: Risk for impaired skin integrity will decrease Outcome: Progressing

## 2023-04-15 NOTE — TOC Transition Note (Addendum)
Transition of Care [facility]) - Discharge Note   Patient Details  Name: [name] MRN: [id number] Date of Birth: January 16, 1930  Transition of Care [facility]) CM/SW Contact:  Howell Rucks, RN Phone Number: 04/15/2023, 12:51 PM   Clinical Narrative:  NCM outreached to [facility], rep-[facility] to request review for short term rehab bed offer, per [facility] bed became available today, accepted for short term rehab, RM 110, Call report [phone]. Pt and pt's son, Thurmond Butts notified, Sharin Mons for transport. No further TOC needs identified.       Final next level of care: Skilled Nursing Facility Barriers to Discharge: No Barriers Identified   Patient Goals and CMS Choice Patient states their goals for this hospitalization and ongoing recovery are:: return to ILF at [email or web address].gov Compare Post Acute Care list provided to:: Patient Choice offered to / list presented to : Patient      Discharge Placement              Patient chooses bed at: [facility] Patient to be transferred to facility by: [facility] Name of family member notified: [name] (Son)  [phone] [facility]) Patient and family notified of of transfer: 04/15/23  Discharge Plan and Services Additional resources added to the After Visit Summary for                                       Social Drivers of Health (SDOH) Interventions SDOH Screenings   Food Insecurity: Patient Declined (04/13/2023)  Housing: Patient Declined (04/13/2023)  Transportation Needs: Patient Declined (04/13/2023)  Utilities: Patient Declined (04/13/2023)  Alcohol Screen: Low Risk  (01/19/2021)  Depression (PHQ2-9): Low Risk  (06/10/2021)  Social Connections: Patient Declined (04/13/2023)  Tobacco Use: Medium Risk (04/12/2023)     Readmission Risk Interventions    04/15/2023   12:50 PM  Readmission Risk Prevention Plan  Transportation Screening Complete  PCP or Specialist Appt within 3-5 Days Complete  HRI or Home  Care Consult Complete  Social Work Consult for Recovery Care Planning/Counseling Complete  Palliative Care Screening Not Applicable  Medication Review [employer]) Complete

## 2023-04-15 NOTE — Progress Notes (Signed)
Subjective: 3 Days Post-Op Procedure(s) (LRB): CLOSED MANIPULATION HIP (Left)  Patient reports that she is not having to take any narcotics for pain.  Tolerating POs well.  Worked with therapy.  Resting comfortably in bed.  Objective:   VITALS:  Temp:  [97.6 F (36.4 C)-98.2 F (36.8 C)] 98.2 F (36.8 C) (01/31 0605) Pulse Rate:  [79-95] 95 (01/31 0605) Resp:  [16-18] 18 (01/31 0605) BP: (156-160)/(77-87) 160/77 (01/31 0605) SpO2:  [98 %-100 %] 100 % (01/31 4098)  General: WDWN patient in NAD. Psych:  Appropriate mood and affect. Neuro:  A&O x 3, Moving all extremities, sensation intact to light touch HEENT:  EOMs intact Chest:  Even non-labored respirations Skin:  C/D/I with KI in place, no rashes or lesions Extremities: warm/dry, no visible edema, erythema or echymosis.  No lymphadenopathy. Pulses: Popliteus 2+ MSK:  ROM: TKE, MMT: able to perform quad set   LABS Recent Labs    04/12/23 1024 04/13/23 0340  HGB 8.6* 8.1*  WBC 7.4 7.5  PLT 366 351   Recent Labs    04/12/23 1024 04/13/23 0340  NA 135 132*  K 4.5 4.4  CL 101 99  CO2 23 23  BUN 24* 29*  CREATININE 0.81 0.98  GLUCOSE 86 158*   No results for input(s): "LABPT", "INR" in the last 72 hours.   Assessment/Plan: 3 Days Post-Op Procedure(s) (LRB): CLOSED MANIPULATION HIP (Left)  WBAT L LE in KI Up with therapy Patient did not progress well enough with therapy to be D/C'd home.  It was required that she be changed from OBs to Inpatient status. Disp: SNF Plan for 2 week outpatient visit with Dr. Lequita Halt No need for narcotic prescription upon D/C  Joana Reamer [facility] Office:  [phone]

## 2023-04-15 NOTE — Progress Notes (Signed)
PROGRESS NOTE    [name]  UJW:[id number] DOB: 01/04/1930 DOA: 04/12/2023 PCP: Emilio Aspen, MD    Brief Narrative:   89 year old with history of left hip dislocation, multiple allergies to antibiotics, anemia of chronic disease, CKD stage II, GERD, myocarditis, thoracic aneurysm presented today with left hip dislocation and pain while getting off commode.  Patient was admitted to orthopedic surgery as ED was unsuccessful in reducing her left hip.  Patient went to the OR for closed reduction on 1/28.  Medical team was consulted for multiple comorbidity management. Medically stable awaiting SNF placement  Assessment & Plan:  Principal Problem:   Closed anterior dislocation of left hip (HCC) Active Problems:   Chronic heart failure with preserved ejection fraction (HFpEF) (HCC)   Essential hypertension   Anemia of chronic disease   Left hip dislocation - Status post closed reduction by orthopedic on 1/28.  Management per their service  Congestive heart failure with preserved ejection fraction - Continue home medications  CKD stage II - Creatinine stable at 0.98.  Anemia of chronic disease - On daily iron supplements.  Bowel regimen.  Hemoglobin is stable around 8.5  Essential hypertension -Continue Coreg, Cardizem.  IV as needed  Patient remains stable from medical standpoint.  Final disposition per primary team.   Subjective: Laying in the bed no complaints Awaiting SNF placement  Examination:  General exam: Appears calm and comfortable  Respiratory system: Clear to auscultation. Respiratory effort normal. Cardiovascular system: S1 & S2 heard, RRR. No JVD, murmurs, rubs, gallops or clicks. No pedal edema. Gastrointestinal system: Abdomen is nondistended, soft and nontender. No organomegaly or masses felt. Normal bowel sounds heard. Central nervous system: Alert and oriented. No focal neurological deficits. Extremities: Symmetric 5 x 5 power. Skin:  No rashes, lesions or ulcers.  Psychiatry: Judgement and insight appear normal. Mood & affect appropriate.                Diet Orders (From admission, onward)     Start     Ordered   04/13/23 0000  Diet - low sodium heart healthy        04/13/23 1118   04/12/23 2352  Diet regular Room service appropriate? Yes; Fluid consistency: Thin  Diet effective now       Question Answer Comment  Room service appropriate? Yes   Fluid consistency: Thin      04/12/23 2351   04/12/23 0000  Diet - low sodium heart healthy        04/12/23 1727            Objective: Vitals:   04/14/23 1328 04/14/23 2102 04/15/23 0605 04/15/23 1034  BP: (!) 156/87 (!) 160/77 (!) 160/77 (!) 159/108  Pulse: 79 83 95 99  Resp: 16 18 18 18   Temp: 97.6 F (36.4 C)  98.2 F (36.8 C) 98.1 F (36.7 C)  TempSrc:   Oral   SpO2: 100% 98% 100% 100%  Weight:      Height:        Intake/Output Summary (Last 24 hours) at 04/15/2023 1055 Last data filed at 04/15/2023 0527 Gross per 24 hour  Intake 300 ml  Output 950 ml  Net -650 ml   Filed Weights   04/12/23 1037  Weight: 57.3 kg    Scheduled Meds:  allopurinol  300 mg Oral Daily   carvedilol  3.125 mg Oral BID WC   colchicine  0.6 mg Oral QODAY   diltiazem  120 mg Oral q  AM   docusate sodium  100 mg Oral BID   ferrous sulfate  650 mg Oral Q breakfast   heparin injection (subcutaneous)  5,000 Units Subcutaneous Q12H   hydrocerin   Topical BID   mupirocin ointment  1 Application Topical BID   triamcinolone cream   Topical TID   Continuous Infusions:  Nutritional status     Body mass index is 21.68 kg/m.  Data Reviewed:   CBC: Recent Labs  Lab 04/12/23 1024 04/13/23 0340  WBC 7.4 7.5  NEUTROABS 4.4 5.3  HGB 8.6* 8.1*  HCT 26.9* 26.5*  MCV 84.3 86.6  PLT 366 351   Basic Metabolic Panel: Recent Labs  Lab 04/12/23 1024 04/13/23 0340  NA 135 132*  K 4.5 4.4  CL 101 99  CO2 23 23  GLUCOSE 86 158*  BUN 24* 29*  CREATININE  0.81 0.98  CALCIUM 9.0 8.6*  MG  --  2.0  PHOS  --  4.4   GFR: Estimated Creatinine Clearance: 32.3 mL/min (by C-G formula based on SCr of 0.98 mg/dL). Liver Function Tests: No results for input(s): "AST", "ALT", "ALKPHOS", "BILITOT", "PROT", "ALBUMIN" in the last 168 hours. No results for input(s): "LIPASE", "AMYLASE" in the last 168 hours. No results for input(s): "AMMONIA" in the last 168 hours. Coagulation Profile: No results for input(s): "INR", "PROTIME" in the last 168 hours. Cardiac Enzymes: No results for input(s): "CKTOTAL", "CKMB", "CKMBINDEX", "TROPONINI" in the last 168 hours. BNP (last 3 results) No results for input(s): "PROBNP" in the last 8760 hours. HbA1C: No results for input(s): "HGBA1C" in the last 72 hours. CBG: No results for input(s): "GLUCAP" in the last 168 hours. Lipid Profile: No results for input(s): "CHOL", "HDL", "LDLCALC", "TRIG", "CHOLHDL", "LDLDIRECT" in the last 72 hours. Thyroid Function Tests: No results for input(s): "TSH", "T4TOTAL", "FREET4", "T3FREE", "THYROIDAB" in the last 72 hours. Anemia Panel: No results for input(s): "VITAMINB12", "FOLATE", "FERRITIN", "TIBC", "IRON", "RETICCTPCT" in the last 72 hours. Sepsis Labs: No results for input(s): "PROCALCITON", "LATICACIDVEN" in the last 168 hours.  No results found for this or any previous visit (from the past 240 hours).       Radiology Studies: No results found.         LOS: 1 day   Time spent= 35 mins    Miguel Rota, MD [facility]  If 7PM-7AM, please contact night-coverage  04/15/2023, 10:55 AM

## 2023-04-18 DIAGNOSIS — I5032 Chronic diastolic (congestive) heart failure: Secondary | ICD-10-CM | POA: Diagnosis not present

## 2023-04-18 DIAGNOSIS — K5901 Slow transit constipation: Secondary | ICD-10-CM | POA: Diagnosis not present

## 2023-04-18 DIAGNOSIS — R5381 Other malaise: Secondary | ICD-10-CM | POA: Diagnosis not present

## 2023-04-18 DIAGNOSIS — M24452 Recurrent dislocation, left hip: Secondary | ICD-10-CM | POA: Diagnosis not present

## 2023-04-18 DIAGNOSIS — E871 Hypo-osmolality and hyponatremia: Secondary | ICD-10-CM | POA: Diagnosis not present

## 2023-04-18 DIAGNOSIS — L309 Dermatitis, unspecified: Secondary | ICD-10-CM | POA: Diagnosis not present

## 2023-04-18 DIAGNOSIS — D509 Iron deficiency anemia, unspecified: Secondary | ICD-10-CM | POA: Diagnosis not present

## 2023-04-18 NOTE — Consult Note (Signed)
[facility] Liaison Consult Note   04/18/2023  MORGIN HALLS 11/22/1929 [id number]  Post hospital review  Insurance: Medicare ACO REACH   Primary Care Provider: Emilio Aspen, MD, listed with Deboraha Sprang at [facility], this provider is listed for the transition of care follow up appointments  and [facility] Transition of Care calls   [facility] Liaison screened the patient remotely at [facility].  Patient trasitioned to [facility] for rehab.    The patient was screened for hospitalization with noted high risk score for unplanned readmission risk 2 hospital admissions in 6 months.  Patient for [facility] Waiver noted   Plan: Will make [facility] RN aware of patient at [facility] for rehab for prior to returning to community, to alert [place] Transitional team of post facility transition.   [facility], Population [facility] does not replace or interfere with any arrangements made by the Inpatient Transition of Care team.   For questions contact:   Charlesetta Shanks, RN, BSN, CCM [place]  [facility] Liaison Direct Dial: [phone] or secure chat Email: [email or web address]

## 2023-04-19 ENCOUNTER — Other Ambulatory Visit: Payer: Self-pay | Admitting: *Deleted

## 2023-04-19 DIAGNOSIS — D509 Iron deficiency anemia, unspecified: Secondary | ICD-10-CM | POA: Diagnosis not present

## 2023-04-19 NOTE — Patient Outreach (Signed)
 Mrs. Selmer recently admitted to [facility] with [facility] waiver. Screening for potential chronic care management services as a benefit of health plan and primary care provider.  Secure communication sent to [place], [facility] social worker to make aware writer is following for transition plans/needs.   Pablo Hurst, MSN, RN, BSN [place]  [facility], [facility] RN Post- Acute Care Manager Direct Dial: [phone]

## 2023-05-05 ENCOUNTER — Other Ambulatory Visit: Payer: Self-pay | Admitting: *Deleted

## 2023-05-05 NOTE — Patient Outreach (Signed)
 Post- Acute Care Manager follow up. [name] previously utilized [facility] waiver for admission.  Update received from [place], [place] [facility] psychotherapist. [name] transitioned to [facility] on Tuesday, 05/03/23. Will have [facility] in house therapy.   No identifiable complex care management needs.   Darlene Noble, MSN, RN, BSN [place]  [facility], [facility] RN Post- Acute Care Manager Direct Dial: [phone]

## 2023-05-10 DIAGNOSIS — R2689 Other abnormalities of gait and mobility: Secondary | ICD-10-CM | POA: Diagnosis not present

## 2023-05-10 DIAGNOSIS — M25552 Pain in left hip: Secondary | ICD-10-CM | POA: Diagnosis not present

## 2023-05-10 DIAGNOSIS — R2681 Unsteadiness on feet: Secondary | ICD-10-CM | POA: Diagnosis not present

## 2023-05-10 DIAGNOSIS — M6259 Muscle wasting and atrophy, not elsewhere classified, multiple sites: Secondary | ICD-10-CM | POA: Diagnosis not present

## 2023-05-14 ENCOUNTER — Encounter (HOSPITAL_COMMUNITY): Payer: Self-pay

## 2023-05-14 ENCOUNTER — Ambulatory Visit (HOSPITAL_COMMUNITY)
Admission: EM | Admit: 2023-05-14 | Discharge: 2023-05-14 | Disposition: A | Discharge status: Home / Self Care | Attending: Emergency Medicine | Admitting: Emergency Medicine

## 2023-05-14 DIAGNOSIS — L97521 Non-pressure chronic ulcer of other part of left foot limited to breakdown of skin: Secondary | ICD-10-CM

## 2023-05-14 MED ORDER — MUPIROCIN 2 % EX OINT: 1.0000 | TOPICAL_OINTMENT | Freq: Two times a day (BID) | CUTANEOUS | 0 refills | Status: DC

## 2023-05-14 NOTE — ED Provider Notes (Signed)
 [facility]    CSN: [id number] Arrival date & time: 05/14/23  1624     History   Chief Complaint Chief Complaint  Patient presents with   Toe Pain    HPI WENDE LONGSTRETH is a 89 y.o. female.  5-6 week history of swelling and pain of left 2nd toe Currently rating pain 6/10. Comes in episodes of shooting pain. Has seen podiatry for this. History of skin ulcer. Last seen on 03/29/2023. Was given bactroban and doxycycline x 10 days. She does report improvement after abx. Never used the bactroban.  Was advised to return in 2 weeks for recheck She has scheduled an appointment for 5 days from now on 05/19/2023  Also history of onychomycosis   Past Medical History:  Diagnosis Date   Anemia    CHRONIC   Aortic insufficiency    a. 2D echo 08/29/15: EF 55-60%, mild LVH, diastolic dysfunction, elevated LV filling pressure, mild AI, severe LAE, mild RAE, mild TR, dilated descending thoracic aorta just distal to the takeoff of the left subclavian, measuring 4.1 cm, no pericardial effusion.   Arthritis    OA-SOME BACK AND NECK PAIN,  GOES TO CHIROPRACTOR TWICE A MONTH;  HX OF JOINT REPLACEMENTS    Cancer (HCC)    SKIN CANCERS REMOVED FROM LEGS   CKD (chronic kidney disease), stage II    Depression    Dyspnea    Essential hypertension    Gait abnormality 09/08/2017   GERD (gastroesophageal reflux disease)    Low sodium levels    Myocarditis (HCC)    PSVT (paroxysmal supraventricular tachycardia) (HCC)    Recurrent Pericarditis    RECURRENT    Thoracic aneurysm    a. Followed by [facility] -last seen in 12/2013 w/ plan for f/u MRA and thoracic surgery f/u in 2 yrs, but patient does not wish to follow any longer.    Patient Active Problem List   Diagnosis Date Noted   Closed anterior dislocation of left hip (HCC) 04/12/2023   Closed dislocation of left hip, subsequent encounter 04/12/2023   Ulcer of foot, unspecified laterality, limited to breakdown of skin (HCC) 03/23/2023    Chronic diastolic CHF (congestive heart failure) (HCC) 12/09/2022   Acute encephalopathy 12/07/2022   Dehydration 12/07/2022   Acute prerenal azotemia 12/07/2022   Leukocytosis 12/07/2022   Chronic hyponatremia 12/07/2022   ILD (interstitial lung disease) (HCC) 10/20/2022   CAP (community acquired pneumonia) 06/12/2022   Chronic heart failure with preserved ejection fraction (HFpEF) (HCC) 04/07/2022   Advanced care planning/counseling discussion 04/07/2022   Chronic venous stasis dermatitis of both lower extremities 04/07/2022   Malaise and fatigue 07/15/2021   Primary osteoarthritis 04/20/2021   Inflammatory polyarthropathy (HCC) 04/20/2021   Hyperuricemia 04/20/2021   Generalized weakness 04/20/2021   Chronic kidney disease, stage 3 unspecified (HCC) 04/20/2021   Adjustment disorder with mixed disturbance of emotions and conduct 01/20/2021   Severe recurrent major depression without psychotic features (HCC) 01/19/2021   Pincer nail deformity 12/01/2020   Iron deficiency anemia 03/24/2020   Clavi 02/19/2020   Lumbar radiculopathy 06/17/2019   Pain due to onychomycosis of toenails of both feet 05/14/2019   Mixed incontinence 03/10/2019   Cervical spondylosis 06/07/2018   DDD (degenerative disc disease), cervical 05/20/2018   History of total knee replacement, left 04/13/2018   History of total knee replacement, right 04/13/2018   History of revision of total replacement of left hip joint 04/13/2018   Gait abnormality 09/08/2017   Chronic cystitis 07/05/2017  Dysuria 07/05/2017   Anemia of chronic disease 09/22/2015   h/o pericarditis    Essential hypertension    Aortic insufficiency    Posterior vitreous detachment, bilateral 06/05/2015   Left wrist pain 05/16/2015   Triquetral chip fracture, left, closed, initial encounter 05/16/2015   Primary osteoarthritis of first carpometacarpal joint of right hand 02/11/2015   Primary osteoarthritis of right wrist 02/11/2015    Arthropathy 11/25/2014   Multiple skin tears 11/25/2014   Hip dislocation, left (HCC) 12/05/2013   Trochanteric bursitis of left hip 11/24/2012   Aneurysm of thoracic aorta (HCC)    Nonexudative age-related macular degeneration 04/22/2011   Posterior capsular opacification, right 04/22/2011   Status post intraocular lens implant 04/22/2011    Past Surgical History:  Procedure Laterality Date   APPENDECTOMY     BREAST EXCISIONAL BIOPSY Left 1992   benign   EXCISION/RELEASE BURSA HIP Left 11/24/2012   Procedure: LEFT HIP BURSECTOMY AND TENDON REPAIR ;  Surgeon: Loanne Drilling, MD;  Location: [facility];  Service: Orthopedics;  Laterality: Left;   HIP CLOSED REDUCTION Left 12/19/2012   Procedure: CLOSED MANIPULATION HIP;  Surgeon: Shelda Pal, MD;  Location: [facility];  Service: Orthopedics;  Laterality: Left;   HIP CLOSED REDUCTION Left 12/05/2013   Procedure: CLOSED MANIPULATION HIP;  Surgeon: Verlee Rossetti, MD;  Location: [facility];  Service: Orthopedics;  Laterality: Left;   HIP CLOSED REDUCTION Left 09/22/2015   Procedure: CLOSED REDUCTION HIP;  Surgeon: Venita Lick, MD;  Location: [facility];  Service: Orthopedics;  Laterality: Left;   HIP CLOSED REDUCTION Left 12/05/2022   Procedure: CLOSED MANIPULATION HIP;  Surgeon: Ollen Gross, MD;  Location: [facility];  Service: Orthopedics;  Laterality: Left;   HIP CLOSED REDUCTION Left 04/12/2023   Procedure: CLOSED MANIPULATION HIP;  Surgeon: Toni Arthurs, MD;  Location: [facility];  Service: Orthopedics;  Laterality: Left;   JOINT REPLACEMENT     LEFT TOTAL HIP REPLACEMENT AND REVISIONS X 2   OOPHORECTOMY     has a partial of one ovary remaingin   PELVIC LAPAROSCOPY     ovarian cyst removal,    REVISION TOTAL HIP ARTHROPLASTY     left   right Achilles Tendon repair  5/13   TOTAL KNEE ARTHROPLASTY     right   TOTAL KNEE ARTHROPLASTY     left   VAGINAL HYSTERECTOMY     with ovarian cyst removal    OB History     Gravida  2   Para  2   Term       Preterm      AB      Living  2      SAB      IAB      Ectopic      Multiple      Live Births               Home Medications    Prior to Admission medications   Medication Sig Start Date End Date Taking? Authorizing Provider  mupirocin ointment (BACTROBAN) 2 % Apply 1 Application topically 2 (two) times daily. 05/14/23  Yes Yerik Zeringue, Lurena Joiner, PA-C  allopurinol (ZYLOPRIM) 300 MG tablet Take 300 mg by mouth daily.    [provider]  amoxicillin (AMOXIL) 500 MG capsule Take 2,000 mg by mouth See admin instructions. Take 2,000 mg by mouth one hour prior to dental procedures    [provider]  B Complex Vitamins (VITAMIN B COMPLEX) TABS Take  1 tablet by mouth daily with breakfast.    [provider]  carvedilol (COREG) 3.125 MG tablet Take 3.125 mg by mouth 2 (two) times daily with a meal.    [provider]  Cholecalciferol (VITAMIN D3) 25 MCG (1000 UT) CAPS Take 1,000 Units by mouth daily.    [provider]  diltiazem (CARDIZEM CD) 120 MG 24 hr capsule Take 120 mg by mouth daily.    [provider]  FEROSUL 325 (65 Fe) MG tablet Take 650 mg by mouth daily with breakfast.    [provider]  furosemide (LASIX) 20 MG tablet Take 1 tablet (20 mg total) by mouth every other day. 04/11/22 04/12/23  Pokhrel, Rebekah Chesterfield, MD  hydrocerin (EUCERIN) CREA Apply 1 Application topically 2 (two) times daily. Apply to intact skin of bilateral legs and feet twice daily. DO NOT PLACE IN BETWEEN TOES. Patient not taking: Reported on 04/12/2023 12/10/22   Rodolph Bong, MD  losartan (COZAAR) 25 MG tablet Take 25 mg by mouth daily.    [provider]  melatonin 3 MG TABS tablet Take 1 tablet (3 mg total) by mouth at bedtime as needed (insomnia). Patient not taking: Reported on 04/12/2023 12/10/22   Rodolph Bong, MD  MITIGARE 0.6 MG CAPS Take 0.6 mg by mouth every other day.    [provider]  OVER THE COUNTER  MEDICATION Take 3 capsules by mouth daily. Balance of Nature - [employer], Historical, MD  OVER THE COUNTER MEDICATION Take 3 capsules by mouth daily. Balance of Nature - Veggies    [provider]  OVER THE COUNTER MEDICATION Take 1 Scoop by mouth daily. AG1 Athletic Greens    [provider]  sodium chloride 1 g tablet Take 1 g by mouth daily. 08/21/19   [provider]  triamcinolone cream (KENALOG) 0.1 % Apply topically 3 (three) times daily. To areas of psoariatic rash. Do not apply to face. Patient taking differently: Apply 1 Application topically 3 (three) times daily as needed (rash). To areas of psoariatic rash. Do not apply to face. 10/25/22   Johney Maine, MD  TYLENOL 500 MG tablet Take 500-1,000 mg by mouth every 6 (six) hours as needed for mild pain or headache.    [provider]    Family History Family History  Problem Relation Age of Onset   Hypertension Mother    Stroke Mother    Heart attack Father 1   Heart failure Father    Heart failure Sister    Aortic aneurysm Sister    Aortic aneurysm Sister    Stroke Sister    Pneumonia Brother    Stroke Brother    ALS Other    Hyperlipidemia Daughter    Aortic aneurysm Son    Breast cancer Neg Hx     Social History Social History   Tobacco Use   Smoking status: Former    Current packs/day: 0.00    Types: Cigarettes    Start date: 1960    Quit date: 1965    Years since quitting: 60.2   Smokeless tobacco: Never   Tobacco comments:    quit age 53  Vaping Use   Vaping status: Never Used  Substance Use Topics   Alcohol use: No    Alcohol/week: 0.0 standard drinks of alcohol   Drug use: Not Currently     Allergies   Cephalexin, Shellfish-derived products, Clindamycin/lincomycin, Lincomycin, Clindamycin hcl, Fosfomycin, Levofloxacin, Penicillamine, Sugar-protein-starch,  Tape, Ciprofloxacin, Sulfa antibiotics, and Sulfites   Review of Systems Review of  Systems Per HPI  Physical Exam Triage Vital Signs ED Triage Vitals  Encounter Vitals Group     BP 05/14/23 1722 (!) 168/89     Systolic BP Percentile --      Diastolic BP Percentile --      Pulse Rate 05/14/23 1722 77     Resp 05/14/23 1722 16     Temp 05/14/23 1722 98 F (36.7 C)     Temp Source 05/14/23 1722 Oral     SpO2 05/14/23 1722 94 %     Weight --      Height --      Head Circumference --      Peak Flow --      Pain Score 05/14/23 1724 6     Pain Loc --      Pain Education --      Exclude from Growth Chart --    No data found.  Updated Vital Signs BP (!) 168/89 (BP Location: Right Arm)   Pulse 77   Temp 98 F (36.7 C) (Oral)   Resp 16   SpO2 94%   Physical Exam Vitals and nursing note reviewed.  Constitutional:      General: She is not in acute distress.    Comments: Chronically ill appearing   HENT:     Mouth/Throat:     Mouth: Mucous membranes are moist.     Pharynx: Oropharynx is clear.  Cardiovascular:     Rate and Rhythm: Normal rate and regular rhythm.     Pulses: Normal pulses.     Heart sounds: Normal heart sounds.  Pulmonary:     Effort: Pulmonary effort is normal.     Breath sounds: Normal breath sounds.  Musculoskeletal:     Cervical back: Normal range of motion.  Feet:     Comments: There is swelling of left 2nd toe with mild erythema. There is no fluctuance or induration. Appears same as original photo sent to podiatrist. Non tender to palpation. Cap refill < 2 seconds. Not hot to touch. No ulcer noted at this time. Skin:    Capillary Refill: Capillary refill takes less than 2 seconds.  Neurological:     Mental Status: She is alert and oriented to person, place, and time.      UC Treatments / Results  Labs (all labs ordered are listed, but only abnormal results are displayed) Labs Reviewed - No data to display  EKG   Radiology No results found.  Procedures Procedures (including critical care time)  Medications Ordered  in UC Medications - No data to display  Initial Impression / Assessment and Plan / UC Course  I have reviewed the triage vital signs and the nursing notes.  Pertinent labs & imaging results that were available during my care of the patient were reviewed by me and considered in my medical decision making (see chart for details).  Swelling but this appears more chronic than acute infection... mild erythema without warmth. I do not have concern for another infection at this time. Maybe some skin irritation from sock rubbing. Since she never used the [place] will go ahead and resend to use twice daily. Advised to elevate foot. Sees podiatry in 5 days.  Final Clinical Impressions(s) / UC Diagnoses   Final diagnoses:  Skin ulcer of toe of left foot, limited to breakdown of skin [facility])     Discharge Instructions  Bactroban twice daily for 1-2 weeks Please keep appointment with podiatry for next Thursday! Elevate the foot when able to reduce swelling     ED Prescriptions     Medication Sig Dispense Auth. Provider   mupirocin ointment (BACTROBAN) 2 % Apply 1 Application topically 2 (two) times daily. 15 g Lc Joynt, Lurena Joiner, PA-C      PDMP not reviewed this encounter.   Marlow Baars, [place] 05/14/23 1806

## 2023-05-14 NOTE — ED Triage Notes (Signed)
 Patient here today with c/o left 2nd toe pain and swelling X 5-6 weeks. Patient has been to [facility]. Has taken Doxycycline with some improvement but still very swollen and pain.

## 2023-05-14 NOTE — Discharge Instructions (Signed)
 Bactroban twice daily for 1-2 weeks Please keep appointment with podiatry for next Thursday! Elevate the foot when able to reduce swelling

## 2023-05-16 ENCOUNTER — Encounter: Payer: Self-pay | Admitting: Podiatry

## 2023-05-16 ENCOUNTER — Encounter: Payer: Self-pay | Admitting: Pulmonary Disease

## 2023-05-16 ENCOUNTER — Encounter: Payer: Self-pay | Admitting: Hematology

## 2023-05-16 ENCOUNTER — Encounter: Payer: Self-pay | Admitting: Internal Medicine

## 2023-05-16 DIAGNOSIS — R488 Other symbolic dysfunctions: Secondary | ICD-10-CM | POA: Diagnosis not present

## 2023-05-16 DIAGNOSIS — M6259 Muscle wasting and atrophy, not elsewhere classified, multiple sites: Secondary | ICD-10-CM | POA: Diagnosis not present

## 2023-05-16 DIAGNOSIS — M25552 Pain in left hip: Secondary | ICD-10-CM | POA: Diagnosis not present

## 2023-05-16 DIAGNOSIS — R2681 Unsteadiness on feet: Secondary | ICD-10-CM | POA: Diagnosis not present

## 2023-05-16 DIAGNOSIS — R2689 Other abnormalities of gait and mobility: Secondary | ICD-10-CM | POA: Diagnosis not present

## 2023-05-17 DIAGNOSIS — M6259 Muscle wasting and atrophy, not elsewhere classified, multiple sites: Secondary | ICD-10-CM | POA: Diagnosis not present

## 2023-05-17 DIAGNOSIS — M25552 Pain in left hip: Secondary | ICD-10-CM | POA: Diagnosis not present

## 2023-05-17 DIAGNOSIS — R2681 Unsteadiness on feet: Secondary | ICD-10-CM | POA: Diagnosis not present

## 2023-05-17 DIAGNOSIS — R2689 Other abnormalities of gait and mobility: Secondary | ICD-10-CM | POA: Diagnosis not present

## 2023-05-17 DIAGNOSIS — R488 Other symbolic dysfunctions: Secondary | ICD-10-CM | POA: Diagnosis not present

## 2023-05-18 ENCOUNTER — Ambulatory Visit: Payer: Medicare Other | Admitting: Pulmonary Disease

## 2023-05-19 ENCOUNTER — Telehealth: Payer: Self-pay | Admitting: Podiatry

## 2023-05-19 ENCOUNTER — Ambulatory Visit (INDEPENDENT_AMBULATORY_CARE_PROVIDER_SITE_OTHER): Payer: Medicare Other | Admitting: Podiatry

## 2023-05-19 ENCOUNTER — Encounter: Payer: Self-pay | Admitting: Podiatry

## 2023-05-19 ENCOUNTER — Other Ambulatory Visit: Payer: Self-pay | Admitting: Podiatry

## 2023-05-19 ENCOUNTER — Ambulatory Visit (INDEPENDENT_AMBULATORY_CARE_PROVIDER_SITE_OTHER)

## 2023-05-19 DIAGNOSIS — M109 Gout, unspecified: Secondary | ICD-10-CM | POA: Diagnosis not present

## 2023-05-19 DIAGNOSIS — M6259 Muscle wasting and atrophy, not elsewhere classified, multiple sites: Secondary | ICD-10-CM | POA: Diagnosis not present

## 2023-05-19 DIAGNOSIS — R2689 Other abnormalities of gait and mobility: Secondary | ICD-10-CM | POA: Diagnosis not present

## 2023-05-19 DIAGNOSIS — M869 Osteomyelitis, unspecified: Secondary | ICD-10-CM | POA: Diagnosis not present

## 2023-05-19 DIAGNOSIS — R2681 Unsteadiness on feet: Secondary | ICD-10-CM | POA: Diagnosis not present

## 2023-05-19 DIAGNOSIS — M25552 Pain in left hip: Secondary | ICD-10-CM | POA: Diagnosis not present

## 2023-05-19 DIAGNOSIS — R488 Other symbolic dysfunctions: Secondary | ICD-10-CM | POA: Diagnosis not present

## 2023-05-19 MED ORDER — METHYLPREDNISOLONE 4 MG PO TBPK: ORAL_TABLET | ORAL | 0 refills | Status: DC

## 2023-05-19 MED ORDER — DOXYCYCLINE HYCLATE 100 MG PO TABS: 100.0000 mg | ORAL_TABLET | Freq: Two times a day (BID) | ORAL | 0 refills | Status: DC

## 2023-05-19 NOTE — Progress Notes (Signed)
 We she presents today after having seen Dr. Donzetta Kohut March 29, 2023 for the same toe left foot.  She states that she took all of her medicine continues to use the cream on a daily basis that she was referring to the Bactroban ointment.  Presenting today with a sock on the foot and a tennis shoe.  States that her toes extremely painful and has gotten worse over the past few days.  She denies fever chills nausea and vomiting.  Objective: Vital signs are stable she is alert and oriented x 3.  Pulses are palpable to the left foot.  Second digit left foot demonstrates a large bulbous nonpulsatile mass with superficial ulceration and some granular tissue present.  There also appears to be a small amount of tophi that is visible.  The toe is exquisitely tender on palpation and warm to the touch.  There is no odor and no purulence.  Radiographs taken today demonstrate an osseously mature individual with severe demineralized bone.  Severe osteoarthritic changes of the forefoot left.  Her second digit of the left foot appears normal for the most part however the distalmost aspect of the toe at the level of the DIPJ does demonstrate osteolysis most likely due to infection as opposed to gout I really do not see any gouty tophus developing.  This is really hard to tell at this point may need to consider MRI prior to any type of surgical intervention at her age.  Assessment: Cannot rule out gout but most likely osteomyelitis second digit left foot.  Plan: Placed her in a [place] shoe today showed her how to dress the toe and someone will dress it on a daily basis for her and we restarted her doxycycline at this point.  I like to see her back in about 2 weeks

## 2023-05-19 NOTE — Telephone Encounter (Signed)
 Patients assisted living facility called. Once that new medication is sent, they would like that order faxed over to them. [facility]. Their fax number is [phone]. They would also like to get instructions on dressing her toe. Thanks.

## 2023-05-19 NOTE — Telephone Encounter (Signed)
 Daughter - in - law called and requesting a call back....... Pt can not take methylPREDNISolone (or any type of steroids) and will like to explain why. Also, requesting this medication be cancelled at the pharmacy.

## 2023-05-20 ENCOUNTER — Other Ambulatory Visit: Payer: Self-pay

## 2023-05-20 DIAGNOSIS — M6259 Muscle wasting and atrophy, not elsewhere classified, multiple sites: Secondary | ICD-10-CM | POA: Diagnosis not present

## 2023-05-20 DIAGNOSIS — M25552 Pain in left hip: Secondary | ICD-10-CM | POA: Diagnosis not present

## 2023-05-20 DIAGNOSIS — R2681 Unsteadiness on feet: Secondary | ICD-10-CM | POA: Diagnosis not present

## 2023-05-20 DIAGNOSIS — R488 Other symbolic dysfunctions: Secondary | ICD-10-CM | POA: Diagnosis not present

## 2023-05-20 DIAGNOSIS — D649 Anemia, unspecified: Secondary | ICD-10-CM

## 2023-05-20 DIAGNOSIS — R2689 Other abnormalities of gait and mobility: Secondary | ICD-10-CM | POA: Diagnosis not present

## 2023-05-20 NOTE — Addendum Note (Signed)
 Addended by: Kristian Covey on: 05/20/2023 08:18 AM   Modules accepted: Orders

## 2023-05-20 NOTE — Telephone Encounter (Signed)
 Prednisone added to patient's allergy list

## 2023-05-23 ENCOUNTER — Inpatient Hospital Stay (HOSPITAL_BASED_OUTPATIENT_CLINIC_OR_DEPARTMENT_OTHER): Payer: Medicare Other | Admitting: Hematology

## 2023-05-23 ENCOUNTER — Encounter: Payer: Self-pay | Admitting: Hematology

## 2023-05-23 ENCOUNTER — Inpatient Hospital Stay: Payer: Medicare Other | Attending: Hematology

## 2023-05-23 VITALS — BP 155/69 | HR 77 | Temp 97.6°F | Resp 17 | Ht 64.0 in | Wt 115.8 lb

## 2023-05-23 DIAGNOSIS — R634 Abnormal weight loss: Secondary | ICD-10-CM | POA: Diagnosis not present

## 2023-05-23 DIAGNOSIS — Z87891 Personal history of nicotine dependence: Secondary | ICD-10-CM | POA: Diagnosis not present

## 2023-05-23 DIAGNOSIS — D649 Anemia, unspecified: Secondary | ICD-10-CM | POA: Diagnosis not present

## 2023-05-23 DIAGNOSIS — R5383 Other fatigue: Secondary | ICD-10-CM | POA: Insufficient documentation

## 2023-05-23 DIAGNOSIS — M069 Rheumatoid arthritis, unspecified: Secondary | ICD-10-CM | POA: Diagnosis not present

## 2023-05-23 DIAGNOSIS — D509 Iron deficiency anemia, unspecified: Secondary | ICD-10-CM | POA: Insufficient documentation

## 2023-05-23 DIAGNOSIS — Z79899 Other long term (current) drug therapy: Secondary | ICD-10-CM | POA: Insufficient documentation

## 2023-05-23 DIAGNOSIS — N182 Chronic kidney disease, stage 2 (mild): Secondary | ICD-10-CM | POA: Diagnosis not present

## 2023-05-23 DIAGNOSIS — D631 Anemia in chronic kidney disease: Secondary | ICD-10-CM | POA: Insufficient documentation

## 2023-05-23 LAB — CBC WITH DIFFERENTIAL (CANCER CENTER ONLY)
Abs Immature Granulocytes: 0.04 10*3/uL (ref 0.00–0.07)
Basophils Absolute: 0.1 10*3/uL (ref 0.0–0.1)
Basophils Relative: 1 %
Eosinophils Absolute: 0.7 10*3/uL — ABNORMAL HIGH (ref 0.0–0.5)
Eosinophils Relative: 7 %
HCT: 30.4 % — ABNORMAL LOW (ref 36.0–46.0)
Hemoglobin: 9.6 g/dL — ABNORMAL LOW (ref 12.0–15.0)
Immature Granulocytes: 0 %
Lymphocytes Relative: 10 %
Lymphs Abs: 1 10*3/uL (ref 0.7–4.0)
MCH: 25.9 pg — ABNORMAL LOW (ref 26.0–34.0)
MCHC: 31.6 g/dL (ref 30.0–36.0)
MCV: 82.2 fL (ref 80.0–100.0)
Monocytes Absolute: 0.9 10*3/uL (ref 0.1–1.0)
Monocytes Relative: 9 %
Neutro Abs: 7.3 10*3/uL (ref 1.7–7.7)
Neutrophils Relative %: 73 %
Platelet Count: 371 10*3/uL (ref 150–400)
RBC: 3.7 MIL/uL — ABNORMAL LOW (ref 3.87–5.11)
RDW: 16.8 % — ABNORMAL HIGH (ref 11.5–15.5)
WBC Count: 10 10*3/uL (ref 4.0–10.5)
nRBC: 0 % (ref 0.0–0.2)

## 2023-05-23 LAB — IRON AND IRON BINDING CAPACITY (CC-WL,HP ONLY)
Iron: 44 ug/dL (ref 28–170)
Saturation Ratios: 14 % (ref 10.4–31.8)
TIBC: 311 ug/dL (ref 250–450)
UIBC: 267 ug/dL (ref 148–442)

## 2023-05-23 LAB — CMP (CANCER CENTER ONLY)
ALT: 16 U/L (ref 0–44)
AST: 27 U/L (ref 15–41)
Albumin: 3.9 g/dL (ref 3.5–5.0)
Alkaline Phosphatase: 87 U/L (ref 38–126)
Anion gap: 8 (ref 5–15)
BUN: 35 mg/dL — ABNORMAL HIGH (ref 8–23)
CO2: 26 mmol/L (ref 22–32)
Calcium: 9.6 mg/dL (ref 8.9–10.3)
Chloride: 95 mmol/L — ABNORMAL LOW (ref 98–111)
Creatinine: 1.24 mg/dL — ABNORMAL HIGH (ref 0.44–1.00)
GFR, Estimated: 41 mL/min — ABNORMAL LOW (ref >60)
Glucose, Bld: 101 mg/dL — ABNORMAL HIGH (ref 70–99)
Potassium: 4.4 mmol/L (ref 3.5–5.1)
Sodium: 129 mmol/L — ABNORMAL LOW (ref 135–145)
Total Bilirubin: 0.4 mg/dL (ref 0.0–1.2)
Total Protein: 8.5 g/dL — ABNORMAL HIGH (ref 6.5–8.1)

## 2023-05-23 LAB — FERRITIN: Ferritin: 204 ng/mL (ref 11–307)

## 2023-05-23 LAB — VITAMIN B12: Vitamin B-12: 784 pg/mL (ref 180–914)

## 2023-05-23 NOTE — Progress Notes (Signed)
 HEMATOLOGY/ONCOLOGY CLINIC NOTE  Date of Service: 05/23/2023   Patient Care Team: Emilio Aspen, MD as PCP - General (Internal Medicine) Christell Constant, MD as PCP - Cardiology (Cardiology) Kennon Rounds as Physician Assistant (Cardiology) Helane Gunther, DPM as Consulting Physician (Podiatry)  CHIEF COMPLAINTS/PURPOSE OF CONSULTATION:  Continued management of normocytic anemia.  HISTORY OF PRESENTING ILLNESS:  Please see previous notes for details on initial presentation  INTERVAL HISTORY:  [name] is a 89 y.o. female here for follow-up of her normocytic anemia.  Patient was last seen by me on 01/25/2023 and sh complained of fatigue and sleepiness.   Patient was admitted to the hospital from 04/12/2023 to 04/15/2023 due to closed anterior dislocation of left hip. She had closed reduction of the dislocated hip. She was discharged to [facility]illed Nursing facility. She was also admitted to the ED on 05/14/2023 due to left 2nd toe swelling pain. She was given Bactroban and doxycycline for 10 days.   Patient is unaccompanied during this visit. Patient is on oxygen during this visit, 2L of oxygen. She notes she has been doing fairly well since our last visit.  She complains of mild body weakness, fatigue, and weight loss during this visit. She has lost around 11 lbs since January. She has moved to a new nursing facility, which is the reason for her weight loss.   She currently takes 2 ferrous sulfate a day. She denies any toxicities.   She denies any new infection issues, fever, chills, night sweats, back pain, chest pain, abdominal pain, or leg swelling.   MEDICAL HISTORY:  Past Medical History:  Diagnosis Date   Anemia    CHRONIC   Aortic insufficiency    a. 2D echo 08/29/15: EF 55-60%, mild LVH, diastolic dysfunction, elevated LV filling pressure, mild AI, severe LAE, mild RAE, mild TR, dilated descending thoracic aorta just distal to the takeoff  of the left subclavian, measuring 4.1 cm, no pericardial effusion.   Arthritis    OA-SOME BACK AND NECK PAIN,  GOES TO CHIROPRACTOR TWICE A MONTH;  HX OF JOINT REPLACEMENTS    Cancer (HCC)    SKIN CANCERS REMOVED FROM LEGS   CKD (chronic kidney disease), stage II    Depression    Dyspnea    Essential hypertension    Gait abnormality 09/08/2017   GERD (gastroesophageal reflux disease)    Low sodium levels    Myocarditis (HCC)    PSVT (paroxysmal supraventricular tachycardia) (HCC)    Recurrent Pericarditis    RECURRENT    Thoracic aneurysm    a. Followed by [facility] -last seen in 12/2013 w/ plan for f/u MRA and thoracic surgery f/u in 2 yrs, but patient does not wish to follow any longer.    SURGICAL HISTORY: Past Surgical History:  Procedure Laterality Date   APPENDECTOMY     BREAST EXCISIONAL BIOPSY Left 1992   benign   EXCISION/RELEASE BURSA HIP Left 11/24/2012   Procedure: LEFT HIP BURSECTOMY AND TENDON REPAIR ;  Surgeon: Loanne Drilling, MD;  Location: [facility];  Service: Orthopedics;  Laterality: Left;   HIP CLOSED REDUCTION Left 12/19/2012   Procedure: CLOSED MANIPULATION HIP;  Surgeon: Shelda Pal, MD;  Location: [facility];  Service: Orthopedics;  Laterality: Left;   HIP CLOSED REDUCTION Left 12/05/2013   Procedure: CLOSED MANIPULATION HIP;  Surgeon: Verlee Rossetti, MD;  Location: [facility];  Service: Orthopedics;  Laterality: Left;   HIP CLOSED REDUCTION Left 09/22/2015  Procedure: CLOSED REDUCTION HIP;  Surgeon: Venita Lick, MD;  Location: [facility];  Service: Orthopedics;  Laterality: Left;   HIP CLOSED REDUCTION Left 12/05/2022   Procedure: CLOSED MANIPULATION HIP;  Surgeon: Ollen Gross, MD;  Location: [facility];  Service: Orthopedics;  Laterality: Left;   HIP CLOSED REDUCTION Left 04/12/2023   Procedure: CLOSED MANIPULATION HIP;  Surgeon: Toni Arthurs, MD;  Location: [facility];  Service: Orthopedics;  Laterality: Left;   JOINT REPLACEMENT     LEFT TOTAL HIP REPLACEMENT AND REVISIONS  X 2   OOPHORECTOMY     has a partial of one ovary remaingin   PELVIC LAPAROSCOPY     ovarian cyst removal,    REVISION TOTAL HIP ARTHROPLASTY     left   right Achilles Tendon repair  5/13   TOTAL KNEE ARTHROPLASTY     right   TOTAL KNEE ARTHROPLASTY     left   VAGINAL HYSTERECTOMY     with ovarian cyst removal    SOCIAL HISTORY: Social History   Socioeconomic History   Marital status: Widowed    Spouse name: Not on file   Number of children: Not on file   Years of education: Not on file   Highest education level: Not on file  Occupational History   Not on file  Tobacco Use   Smoking status: Former    Current packs/day: 0.00    Types: Cigarettes    Start date: 105    Quit date: 110    Years since quitting: 60.2   Smokeless tobacco: Never   Tobacco comments:    quit age 49  Vaping Use   Vaping status: Never Used  Substance and Sexual Activity   Alcohol use: No    Alcohol/week: 0.0 standard drinks of alcohol   Drug use: Not Currently   Sexual activity: Not Currently    Birth control/protection: Surgical    Comment: HYST-1st intercourse 89 yo-Fewer than 5 partners  Other Topics Concern   Not on file  Social History Narrative   Diet: very good      Caffeine: 1 1/2 cup coffee (usually decaf)      Married, if yes what year: Widow, 1954      Do you live in a house, apartment, assisted living, condo, trailer, ect: [place], independent living      Is it one or more stories: 2nd level. elevate      How many persons live in your home? 1      Pets: No      Highest level or education completed: 14      Current/Past profession: Health and safety inspector      Exercise:  Yes                Type and how often: aerobics twice daily         Living Will: Yes   DNR: Yes   POA/HPOA: Yes      Functional Status:   Do you have difficulty bathing or dressing yourself? No   Do you have difficulty preparing food or eating? No   Do you have difficulty managing  your medications? No   Do you have difficulty managing your finances? No   Do you have difficulty affording your medications? No   Social Drivers of Corporate investment banker Strain: Not on file  Food Insecurity: Patient Declined (04/13/2023)   Hunger Vital Sign    Worried About Running Out of Food in the Last Year: Patient  declined    Barista in the Last Year: Patient declined  Transportation Needs: Patient Declined (04/13/2023)   PRAPARE - Administrator, Civil Service (Medical): Patient declined    Lack of Transportation (Non-Medical): Patient declined  Physical Activity: Not on file  Stress: Not on file  Social Connections: Patient Declined (04/13/2023)   Social Connection and Isolation Panel [NHANES]    Frequency of Communication with Friends and Family: Patient declined    Frequency of Social Gatherings with Friends and Family: Patient declined    Attends Religious Services: Patient declined    Database administrator or Organizations: Patient declined    Attends Banker Meetings: Patient declined    Marital Status: Patient declined  Intimate Partner Violence: Patient Declined (04/13/2023)   Humiliation, Afraid, Rape, and Kick questionnaire    Fear of Current or Ex-Partner: Patient declined    Emotionally Abused: Patient declined    Physically Abused: Patient declined    Sexually Abused: Patient declined    FAMILY HISTORY: Family History  Problem Relation Age of Onset   Hypertension Mother    Stroke Mother    Heart attack Father 49   Heart failure Father    Heart failure Sister    Aortic aneurysm Sister    Aortic aneurysm Sister    Stroke Sister    Pneumonia Brother    Stroke Brother    ALS Other    Hyperlipidemia Daughter    Aortic aneurysm Son    Breast cancer Neg Hx     ALLERGIES:  is allergic to cephalexin, shellfish-derived products, clindamycin/lincomycin, lincomycin, clindamycin hcl, fosfomycin, levofloxacin, penicillamine,  prednisone, sugar-protein-starch, tape, ciprofloxacin, sulfa antibiotics, and sulfites.  MEDICATIONS:  Current Outpatient Medications  Medication Sig Dispense Refill   allopurinol (ZYLOPRIM) 300 MG tablet Take 300 mg by mouth daily.     amoxicillin (AMOXIL) 500 MG capsule Take 2,000 mg by mouth See admin instructions. Take 2,000 mg by mouth one hour prior to dental procedures     B Complex Vitamins (VITAMIN B COMPLEX) TABS Take 1 tablet by mouth daily with breakfast.     carvedilol (COREG) 3.125 MG tablet Take 3.125 mg by mouth 2 (two) times daily with a meal.     Cholecalciferol (VITAMIN D3) 25 MCG (1000 UT) CAPS Take 1,000 Units by mouth daily.     diltiazem (CARDIZEM CD) 120 MG 24 hr capsule Take 120 mg by mouth daily.     doxycycline (VIBRA-TABS) 100 MG tablet Take 1 tablet (100 mg total) by mouth 2 (two) times daily. 20 tablet 0   FEROSUL 325 (65 Fe) MG tablet Take 650 mg by mouth daily with breakfast.     furosemide (LASIX) 20 MG tablet Take 1 tablet (20 mg total) by mouth every other day. 45 tablet 3   hydrocerin (EUCERIN) CREA Apply 1 Application topically 2 (two) times daily. Apply to intact skin of bilateral legs and feet twice daily. DO NOT PLACE IN BETWEEN TOES. (Patient not taking: Reported on 04/12/2023) 454 g 0   losartan (COZAAR) 25 MG tablet Take 25 mg by mouth daily.     melatonin 3 MG TABS tablet Take 1 tablet (3 mg total) by mouth at bedtime as needed (insomnia). (Patient not taking: Reported on 04/12/2023)     MITIGARE 0.6 MG CAPS Take 0.6 mg by mouth every other day.     mupirocin ointment (BACTROBAN) 2 % Apply 1 Application topically 2 (two) times daily. 15 g 0  OVER THE COUNTER MEDICATION Take 3 capsules by mouth daily. Balance of Nature - Fruits     OVER THE COUNTER MEDICATION Take 3 capsules by mouth daily. Balance of Nature - Veggies     OVER THE COUNTER MEDICATION Take 1 Scoop by mouth daily. AG1 Athletic Greens     sodium chloride 1 g tablet Take 1 g by mouth daily.      triamcinolone cream (KENALOG) 0.1 % Apply topically 3 (three) times daily. To areas of psoariatic rash. Do not apply to face. (Patient taking differently: Apply 1 Application topically 3 (three) times daily as needed (rash). To areas of psoariatic rash. Do not apply to face.) 30 g 0   TYLENOL 500 MG tablet Take 500-1,000 mg by mouth every 6 (six) hours as needed for mild pain or headache.     No current facility-administered medications for this visit.    REVIEW OF SYSTEMS:   .10 Point review of Systems was done is negative except as noted above.  PHYSICAL EXAMINATION: ECOG PERFORMANCE STATUS: 0 - Asymptomatic  .BP (!) 155/69 Comment: Nurse notified  Pulse 77   Temp 97.6 F (36.4 C) (Temporal)   Resp 17   Ht 5\' 4"  (1.626 m)   Wt 115 lb 12.8 oz (52.5 kg)   SpO2 100%   BMI 19.88 kg/m   Exam was given in a chair.  NAD GENERAL:alert, in no acute distress and comfortable SKIN: no acute rashes, no significant lesions EYES: conjunctiva are pink and non-injected, sclera anicteric NECK: supple, no JVD LYMPH:  no palpable lymphadenopathy in the cervical, axillary or inguinal regions LUNGS: clear to auscultation b/l with normal respiratory effort HEART: regular rate & rhythm ABDOMEN:  normoactive bowel sounds , non tender, not distended. Extremity: no pedal edema PSYCH: alert & oriented x 3 with fluent speech NEURO: no focal motor/sensory deficits  LABORATORY DATA:   I have reviewed the data as listed  .    Latest Ref Rng & Units 05/23/2023   12:19 PM 04/13/2023    3:40 AM 04/12/2023   10:24 AM  CBC  WBC 4.0 - 10.5 K/uL 10.0  7.5  7.4   Hemoglobin 12.0 - 15.0 g/dL 9.6  8.1  8.6   Hematocrit 36.0 - 46.0 % 30.4  26.5  26.9   Platelets 150 - 400 K/uL 371  351  366    . CBC    Component Value Date/Time   WBC 10.0 05/23/2023 1219   WBC 7.5 04/13/2023 0340   RBC 3.70 (L) 05/23/2023 1219   HGB 9.6 (L) 05/23/2023 1219   HGB 10.8 (L) 12/26/2007 1539   HCT 30.4 (L)  05/23/2023 1219   HCT 28.2 (L) 02/17/2021 1001   HCT 33.4 (L) 12/26/2007 1539   PLT 371 05/23/2023 1219   PLT 314 12/26/2007 1539   MCV 82.2 05/23/2023 1219   MCV 88.5 12/26/2007 1539   MCH 25.9 (L) 05/23/2023 1219   MCHC 31.6 05/23/2023 1219   RDW 16.8 (H) 05/23/2023 1219   RDW 15.3 (H) 12/26/2007 1539   LYMPHSABS 1.0 05/23/2023 1219   LYMPHSABS 2.2 12/26/2007 1539   MONOABS 0.9 05/23/2023 1219   MONOABS 0.8 12/26/2007 1539   EOSABS 0.7 (H) 05/23/2023 1219   EOSABS 0.6 (H) 12/26/2007 1539   BASOSABS 0.1 05/23/2023 1219   BASOSABS 0.1 12/26/2007 1539     .    Latest Ref Rng & Units 05/23/2023   12:19 PM 04/13/2023    3:40 AM 04/12/2023  10:24 AM  CMP  Glucose 70 - 99 mg/dL 409  811  86   BUN 8 - 23 mg/dL 35  29  24   Creatinine 0.44 - 1.00 mg/dL 9.14  7.82  9.56   Sodium 135 - 145 mmol/L 129  132  135   Potassium 3.5 - 5.1 mmol/L 4.4  4.4  4.5   Chloride 98 - 111 mmol/L 95  99  101   CO2 22 - 32 mmol/L 26  23  23    Calcium 8.9 - 10.3 mg/dL 9.6  8.6  9.0   Total Protein 6.5 - 8.1 g/dL 8.5     Total Bilirubin 0.0 - 1.2 mg/dL 0.4     Alkaline Phos 38 - 126 U/L 87     AST 15 - 41 U/L 27     ALT 0 - 44 U/L 16      Component     Latest Ref Rng & Units 03/06/2020  Iron     41 - 142 ug/dL 35 (L)  TIBC     213 - 444 ug/dL 086 (H)  Saturation Ratios     21 - 57 % 8 (L)  UIBC     120 - 384 ug/dL 578 (H)  Folate, Hemolysate     Not Estab. ng/mL 605.0  HCT     34.0 - 46.6 % 29.6 (L)  Folate, RBC     >498 ng/mL 2,044  Erythropoietin     2.6 - 18.5 mIU/mL 14.9  RA Latex Turbid.     <14.0 IU/mL 99.5 (H)  Vitamin B12     180 - 914 pg/mL 841  Ferritin     11 - 307 ng/mL 35  Sed Rate     0 - 22 mm/hr 22   . Lab Results  Component Value Date   IRON 44 05/23/2023   TIBC 311 05/23/2023   IRONPCTSAT 14 05/23/2023   (Iron and TIBC)  Lab Results  Component Value Date   FERRITIN 204 05/23/2023     RADIOGRAPHIC STUDIES: I have personally reviewed the  radiological images as listed and agreed with the findings in the report. DG Foot Complete Left Result Date: 05/19/2023 Please see detailed radiograph report in office note.   ASSESSMENT & PLAN:   89 yo with   1) Chronic normocytic anemia Due to CKD + iron deficiency + anemia of chronic inflammation from possible RA 2) RA -not currently on treatment  PLAN: -Discussed lab results from today, 05/23/2023, in detail with the patient. CBC shows patient is anemic with low Hgb of 9.6 g/dL with low Hct of 46.9%. CMP shows elevated Bun of 35, elevated creatinine of 1.24, low sodium level of 129.  F/u with PCP for mx of other medications and hyponatremia -Discussed with the patient that she does not require Erythropoietin injection right now.  -Discussed the option of IV Iron infusion. Patient agrees.  -Continue B complex and B12 replacement -Discussed to drink at least 2 L of water everyday. -Answered all patient's questions.   FOLLOW UP: IV Injectafer 750mg  weekly x 2 doses at [facility] per patients preference RTC with Dr Candise Che with labs in 3 months  The total time spent in the appointment was 23 minutes* .  All of the patient's questions were answered with apparent satisfaction. The patient knows to call the clinic with any problems, questions or concerns.   Wyvonnia Lora MD MS AAHIVMS [facility] Hematology/Oncology Physician [facility]  .*Total Encounter  Time as defined by the Centers for Medicare and Medicaid Services includes, in addition to the face-to-face time of a patient visit (documented in the note above) non-face-to-face time: obtaining and reviewing outside history, ordering and reviewing medications, tests or procedures, care coordination (communications with other health care professionals or caregivers) and documentation in the medical record.   I,Param Shah,acting as a Neurosurgeon for Wyvonnia Lora, MD.,have documented all relevant documentation on the behalf of Wyvonnia Lora,  MD,as directed by  Wyvonnia Lora, MD while in the presence of Wyvonnia Lora, MD.  .I have reviewed the above documentation for accuracy and completeness, and I agree with the above. Johney Maine MD

## 2023-05-24 DIAGNOSIS — R531 Weakness: Secondary | ICD-10-CM | POA: Diagnosis not present

## 2023-05-24 DIAGNOSIS — R2681 Unsteadiness on feet: Secondary | ICD-10-CM | POA: Diagnosis not present

## 2023-05-24 DIAGNOSIS — M25552 Pain in left hip: Secondary | ICD-10-CM | POA: Diagnosis not present

## 2023-05-24 DIAGNOSIS — Z515 Encounter for palliative care: Secondary | ICD-10-CM | POA: Diagnosis not present

## 2023-05-24 DIAGNOSIS — R269 Unspecified abnormalities of gait and mobility: Secondary | ICD-10-CM | POA: Diagnosis not present

## 2023-05-24 DIAGNOSIS — I5020 Unspecified systolic (congestive) heart failure: Secondary | ICD-10-CM | POA: Diagnosis not present

## 2023-05-24 DIAGNOSIS — M6259 Muscle wasting and atrophy, not elsewhere classified, multiple sites: Secondary | ICD-10-CM | POA: Diagnosis not present

## 2023-05-24 DIAGNOSIS — R2689 Other abnormalities of gait and mobility: Secondary | ICD-10-CM | POA: Diagnosis not present

## 2023-05-24 DIAGNOSIS — R488 Other symbolic dysfunctions: Secondary | ICD-10-CM | POA: Diagnosis not present

## 2023-05-25 DIAGNOSIS — L282 Other prurigo: Secondary | ICD-10-CM | POA: Diagnosis not present

## 2023-05-25 DIAGNOSIS — R488 Other symbolic dysfunctions: Secondary | ICD-10-CM | POA: Diagnosis not present

## 2023-05-25 DIAGNOSIS — R2681 Unsteadiness on feet: Secondary | ICD-10-CM | POA: Diagnosis not present

## 2023-05-25 DIAGNOSIS — M6259 Muscle wasting and atrophy, not elsewhere classified, multiple sites: Secondary | ICD-10-CM | POA: Diagnosis not present

## 2023-05-25 DIAGNOSIS — L309 Dermatitis, unspecified: Secondary | ICD-10-CM | POA: Diagnosis not present

## 2023-05-25 DIAGNOSIS — Z85828 Personal history of other malignant neoplasm of skin: Secondary | ICD-10-CM | POA: Diagnosis not present

## 2023-05-25 DIAGNOSIS — R2689 Other abnormalities of gait and mobility: Secondary | ICD-10-CM | POA: Diagnosis not present

## 2023-05-25 DIAGNOSIS — M25552 Pain in left hip: Secondary | ICD-10-CM | POA: Diagnosis not present

## 2023-05-26 ENCOUNTER — Ambulatory Visit (INDEPENDENT_AMBULATORY_CARE_PROVIDER_SITE_OTHER): Payer: Medicare Other | Admitting: Podiatry

## 2023-05-26 ENCOUNTER — Encounter: Payer: Self-pay | Admitting: Podiatry

## 2023-05-26 DIAGNOSIS — B351 Tinea unguium: Secondary | ICD-10-CM | POA: Diagnosis not present

## 2023-05-26 DIAGNOSIS — L97521 Non-pressure chronic ulcer of other part of left foot limited to breakdown of skin: Secondary | ICD-10-CM

## 2023-05-26 DIAGNOSIS — R2689 Other abnormalities of gait and mobility: Secondary | ICD-10-CM | POA: Diagnosis not present

## 2023-05-26 DIAGNOSIS — R2681 Unsteadiness on feet: Secondary | ICD-10-CM | POA: Diagnosis not present

## 2023-05-26 DIAGNOSIS — M6259 Muscle wasting and atrophy, not elsewhere classified, multiple sites: Secondary | ICD-10-CM | POA: Diagnosis not present

## 2023-05-26 DIAGNOSIS — R488 Other symbolic dysfunctions: Secondary | ICD-10-CM | POA: Diagnosis not present

## 2023-05-26 DIAGNOSIS — M79675 Pain in left toe(s): Secondary | ICD-10-CM | POA: Diagnosis not present

## 2023-05-26 DIAGNOSIS — N1831 Chronic kidney disease, stage 3a: Secondary | ICD-10-CM

## 2023-05-26 DIAGNOSIS — M79674 Pain in right toe(s): Secondary | ICD-10-CM | POA: Diagnosis not present

## 2023-05-26 DIAGNOSIS — M25552 Pain in left hip: Secondary | ICD-10-CM | POA: Diagnosis not present

## 2023-05-26 NOTE — Progress Notes (Signed)
 This patient returns to the office for evaluation and treatment of long thick painful nails .  This patient is unable to trim his own nails since the patient cannot reach his feet.  Patient says the nails are painful walking and wearing his shoes.   She has healing ulcer second toe left foot which she is treating at home with cream and open toe wooden shoe.She returns for preventive foot care services.  General Appearance  Alert, conversant and in no acute stress.  Vascular  Dorsalis pedis and posterior tibial  pulses are  weakly palpable  bilaterally.  Capillary return is within normal limits  bilaterally. Temperature is within normal limits  bilaterally.  Neurologic  Senn-Weinstein monofilament wire test within normal limits  bilaterally. Muscle power within normal limits bilaterally.  Nails Thick disfigured discolored nails with subungual debris  from hallux to fifth toes bilaterally. No evidence of bacterial infection or drainage bilaterally. Pincer fourth toenails  B/l.  Orthopedic  No limitations of motion  feet .  No crepitus or effusions noted.  No bony pathology or digital deformities noted. 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 HAV left with hammer toe second left. Toe is thickened and contracted.  Skin  normotropic  bilaterally.  No signs of infections .  Ulcer noted second toe left foot.       Onychomycosis  Pain in toes right foot  Pain in toes left foot  Ulcer second toe left foot.  Debridement  of nails  1-5  B/L with a nail nipper.  Nails were then filed using a dremel tool with no incidents.   Healing ulcer second toe left foot. RTC 9 weeks     Helane Gunther DPM

## 2023-05-27 DIAGNOSIS — R2689 Other abnormalities of gait and mobility: Secondary | ICD-10-CM | POA: Diagnosis not present

## 2023-05-27 DIAGNOSIS — R2681 Unsteadiness on feet: Secondary | ICD-10-CM | POA: Diagnosis not present

## 2023-05-27 DIAGNOSIS — R488 Other symbolic dysfunctions: Secondary | ICD-10-CM | POA: Diagnosis not present

## 2023-05-27 DIAGNOSIS — M6259 Muscle wasting and atrophy, not elsewhere classified, multiple sites: Secondary | ICD-10-CM | POA: Diagnosis not present

## 2023-05-27 DIAGNOSIS — M25552 Pain in left hip: Secondary | ICD-10-CM | POA: Diagnosis not present

## 2023-05-30 DIAGNOSIS — R2681 Unsteadiness on feet: Secondary | ICD-10-CM | POA: Diagnosis not present

## 2023-05-30 DIAGNOSIS — R2689 Other abnormalities of gait and mobility: Secondary | ICD-10-CM | POA: Diagnosis not present

## 2023-05-30 DIAGNOSIS — M25552 Pain in left hip: Secondary | ICD-10-CM | POA: Diagnosis not present

## 2023-05-30 DIAGNOSIS — M6259 Muscle wasting and atrophy, not elsewhere classified, multiple sites: Secondary | ICD-10-CM | POA: Diagnosis not present

## 2023-05-30 DIAGNOSIS — R488 Other symbolic dysfunctions: Secondary | ICD-10-CM | POA: Diagnosis not present

## 2023-05-31 DIAGNOSIS — R2689 Other abnormalities of gait and mobility: Secondary | ICD-10-CM | POA: Diagnosis not present

## 2023-05-31 DIAGNOSIS — R488 Other symbolic dysfunctions: Secondary | ICD-10-CM | POA: Diagnosis not present

## 2023-05-31 DIAGNOSIS — M6259 Muscle wasting and atrophy, not elsewhere classified, multiple sites: Secondary | ICD-10-CM | POA: Diagnosis not present

## 2023-05-31 DIAGNOSIS — M25552 Pain in left hip: Secondary | ICD-10-CM | POA: Diagnosis not present

## 2023-05-31 DIAGNOSIS — R2681 Unsteadiness on feet: Secondary | ICD-10-CM | POA: Diagnosis not present

## 2023-06-01 NOTE — Telephone Encounter (Signed)
 Bukky called from Facility asking if patient needs to continue the Colchicine. Please call her back [phone]

## 2023-06-02 ENCOUNTER — Encounter: Payer: Self-pay | Admitting: Podiatry

## 2023-06-02 ENCOUNTER — Ambulatory Visit (INDEPENDENT_AMBULATORY_CARE_PROVIDER_SITE_OTHER): Admitting: Podiatry

## 2023-06-02 DIAGNOSIS — R2681 Unsteadiness on feet: Secondary | ICD-10-CM | POA: Diagnosis not present

## 2023-06-02 DIAGNOSIS — M25552 Pain in left hip: Secondary | ICD-10-CM | POA: Diagnosis not present

## 2023-06-02 DIAGNOSIS — R488 Other symbolic dysfunctions: Secondary | ICD-10-CM | POA: Diagnosis not present

## 2023-06-02 DIAGNOSIS — L97521 Non-pressure chronic ulcer of other part of left foot limited to breakdown of skin: Secondary | ICD-10-CM

## 2023-06-02 DIAGNOSIS — M6259 Muscle wasting and atrophy, not elsewhere classified, multiple sites: Secondary | ICD-10-CM | POA: Diagnosis not present

## 2023-06-02 DIAGNOSIS — R2689 Other abnormalities of gait and mobility: Secondary | ICD-10-CM | POA: Diagnosis not present

## 2023-06-02 MED ORDER — DOXYCYCLINE HYCLATE 100 MG PO TABS: 100.0000 mg | ORAL_TABLET | Freq: Two times a day (BID) | ORAL | 0 refills | Status: DC

## 2023-06-02 NOTE — Progress Notes (Signed)
 He presents today wheelchair-bound with her daughter for follow-up of her pain and swelling to the second digit of the left foot.  She is continue to take her doxycycline which she has now finished and her colchicine daily.  She states that it feels a lot better is no longer painful like it was.  Continues to wear her Darco shoe.  Objective: Vital signs stable oriented x 3 the toe is still edematous but it is firm to the touch more like a gouty tophus consolidation.  I also feel that the drainage has decreased considerably and the wound is much smaller.  It is not nearly as warm to touch as the one previously and the cellulitic process is no longer present.  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  Plan: We have to continue the doxycycline and the daily colchicine dose is 0.6 mg.  We are going to continue the doxycycline for the next 10 days.  She will continue the dressing changes daily and the Darco shoe.  Follow-up with her in a couple of weeks.

## 2023-06-03 ENCOUNTER — Emergency Department (HOSPITAL_COMMUNITY)
Admission: EM | Admit: 2023-06-03 | Discharge: 2023-06-03 | Disposition: A | Discharge status: Home / Self Care | Attending: Emergency Medicine | Admitting: Emergency Medicine

## 2023-06-03 ENCOUNTER — Other Ambulatory Visit: Payer: Self-pay

## 2023-06-03 DIAGNOSIS — R1030 Lower abdominal pain, unspecified: Secondary | ICD-10-CM | POA: Insufficient documentation

## 2023-06-03 DIAGNOSIS — M25552 Pain in left hip: Secondary | ICD-10-CM | POA: Diagnosis not present

## 2023-06-03 DIAGNOSIS — I10 Essential (primary) hypertension: Secondary | ICD-10-CM | POA: Diagnosis not present

## 2023-06-03 DIAGNOSIS — R2681 Unsteadiness on feet: Secondary | ICD-10-CM | POA: Diagnosis not present

## 2023-06-03 DIAGNOSIS — R300 Dysuria: Secondary | ICD-10-CM | POA: Diagnosis not present

## 2023-06-03 DIAGNOSIS — R488 Other symbolic dysfunctions: Secondary | ICD-10-CM | POA: Diagnosis not present

## 2023-06-03 DIAGNOSIS — Z7401 Bed confinement status: Secondary | ICD-10-CM | POA: Diagnosis not present

## 2023-06-03 DIAGNOSIS — R1084 Generalized abdominal pain: Secondary | ICD-10-CM | POA: Diagnosis not present

## 2023-06-03 DIAGNOSIS — E871 Hypo-osmolality and hyponatremia: Secondary | ICD-10-CM

## 2023-06-03 DIAGNOSIS — M6259 Muscle wasting and atrophy, not elsewhere classified, multiple sites: Secondary | ICD-10-CM | POA: Diagnosis not present

## 2023-06-03 DIAGNOSIS — N189 Chronic kidney disease, unspecified: Secondary | ICD-10-CM | POA: Diagnosis not present

## 2023-06-03 DIAGNOSIS — R2689 Other abnormalities of gait and mobility: Secondary | ICD-10-CM | POA: Diagnosis not present

## 2023-06-03 DIAGNOSIS — I509 Heart failure, unspecified: Secondary | ICD-10-CM | POA: Diagnosis not present

## 2023-06-03 LAB — URINALYSIS, W/ REFLEX TO CULTURE (INFECTION SUSPECTED)
Bacteria, UA: NONE SEEN
Bilirubin Urine: NEGATIVE
Glucose, UA: NEGATIVE mg/dL
Hgb urine dipstick: NEGATIVE
Ketones, ur: NEGATIVE mg/dL
Nitrite: NEGATIVE
Protein, ur: 30 mg/dL — AB
Specific Gravity, Urine: 1.012 (ref 1.005–1.030)
pH: 5 (ref 5.0–8.0)

## 2023-06-03 LAB — CBC WITH DIFFERENTIAL/PLATELET
Abs Immature Granulocytes: 0.03 10*3/uL (ref 0.00–0.07)
Basophils Absolute: 0.1 10*3/uL (ref 0.0–0.1)
Basophils Relative: 2 %
Eosinophils Absolute: 0.4 10*3/uL (ref 0.0–0.5)
Eosinophils Relative: 6 %
HCT: 27.8 % — ABNORMAL LOW (ref 36.0–46.0)
Hemoglobin: 8.7 g/dL — ABNORMAL LOW (ref 12.0–15.0)
Immature Granulocytes: 0 %
Lymphocytes Relative: 19 %
Lymphs Abs: 1.3 10*3/uL (ref 0.7–4.0)
MCH: 25.9 pg — ABNORMAL LOW (ref 26.0–34.0)
MCHC: 31.3 g/dL (ref 30.0–36.0)
MCV: 82.7 fL (ref 80.0–100.0)
Monocytes Absolute: 0.7 10*3/uL (ref 0.1–1.0)
Monocytes Relative: 11 %
Neutro Abs: 4.2 10*3/uL (ref 1.7–7.7)
Neutrophils Relative %: 62 %
Platelets: 253 10*3/uL (ref 150–400)
RBC: 3.36 MIL/uL — ABNORMAL LOW (ref 3.87–5.11)
RDW: 16.9 % — ABNORMAL HIGH (ref 11.5–15.5)
WBC: 6.7 10*3/uL (ref 4.0–10.5)
nRBC: 0 % (ref 0.0–0.2)

## 2023-06-03 LAB — COMPREHENSIVE METABOLIC PANEL
ALT: 24 U/L (ref 0–44)
AST: 35 U/L (ref 15–41)
Albumin: 3.1 g/dL — ABNORMAL LOW (ref 3.5–5.0)
Alkaline Phosphatase: 98 U/L (ref 38–126)
Anion gap: 9 (ref 5–15)
BUN: 34 mg/dL — ABNORMAL HIGH (ref 8–23)
CO2: 23 mmol/L (ref 22–32)
Calcium: 9.2 mg/dL (ref 8.9–10.3)
Chloride: 92 mmol/L — ABNORMAL LOW (ref 98–111)
Creatinine, Ser: 0.98 mg/dL (ref 0.44–1.00)
GFR, Estimated: 54 mL/min — ABNORMAL LOW (ref >60)
Glucose, Bld: 91 mg/dL (ref 70–99)
Potassium: 4.7 mmol/L (ref 3.5–5.1)
Sodium: 124 mmol/L — ABNORMAL LOW (ref 135–145)
Total Bilirubin: 0.3 mg/dL (ref 0.0–1.2)
Total Protein: 7.5 g/dL (ref 6.5–8.1)

## 2023-06-03 MED ORDER — SODIUM CHLORIDE 0.9 % IV BOLUS
500.0000 mL | Freq: Once | INTRAVENOUS | Status: AC
  Administered 2023-06-03: 500 mL via INTRAVENOUS

## 2023-06-03 NOTE — Discharge Instructions (Addendum)
 You were seen in the emergency department for your inability to urinate.  You are not retaining any urine while you were in the ER and your urine showed no signs of urinary tract infection.  Your sodium levels today were slightly lower than they have been previously so you may have some mild dehydration that was contributing to your decreased urination.  We did give you some fluids in the ER.  You should make sure that you are drinking plenty of fluids and staying well-hydrated and should have your sodium level rechecked by your primary doctor in the next few days.  You should return to the emergency department if you are having any confusion, you have a seizure, or if you have any other new or concerning symptoms.

## 2023-06-03 NOTE — ED Provider Notes (Signed)
 [place] EMERGENCY DEPARTMENT AT [facility] Provider Note   CSN: [id number] Arrival date & time: 06/03/23  2005     History  Chief Complaint  Patient presents with   Urinary Retention    [name] is a 89 y.o. female.  Patient is a 89 year old female with a history of CHF and CKD presenting to the emergency department with decreased urination.  The patient states that she does not think that she is urinated all day and was concern for urinary retention.  She states that she is having lower abdominal pain.  She states that she has had some pain when she is pain recently.  She states that she has had normal bowel movements today and has not had any recent constipation.  Denies any fever, nausea or vomiting.  Of note she was started on doxycycline yesterday by her podiatrist for possible cellulitis of her toe.  The history is provided by the patient.       Home Medications Prior to Admission medications   Medication Sig Start Date End Date Taking? Authorizing Provider  allopurinol (ZYLOPRIM) 300 MG tablet Take 300 mg by mouth daily.    [provider]  amoxicillin (AMOXIL) 500 MG capsule Take 2,000 mg by mouth See admin instructions. Take 2,000 mg by mouth one hour prior to dental procedures    [provider]  B Complex Vitamins (VITAMIN B COMPLEX) TABS Take 1 tablet by mouth daily with breakfast.    [provider]  carvedilol (COREG) 3.125 MG tablet Take 3.125 mg by mouth 2 (two) times daily with a meal.    [provider]  Cholecalciferol (VITAMIN D3) 25 MCG (1000 UT) CAPS Take 1,000 Units by mouth daily.    [provider]  diltiazem (CARDIZEM CD) 120 MG 24 hr capsule Take 120 mg by mouth daily.    [provider]  doxycycline (VIBRA-TABS) 100 MG tablet Take 1 tablet (100 mg total) by mouth 2 (two) times daily. 06/02/23   Hyatt, Max T, DPM  FEROSUL 325 (65 Fe) MG tablet Take 650 mg by mouth daily with  breakfast.    [provider]  furosemide (LASIX) 20 MG tablet Take 1 tablet (20 mg total) by mouth every other day. 04/11/22 04/12/23  Pokhrel, Rebekah Chesterfield, MD  hydrocerin (EUCERIN) CREA Apply 1 Application topically 2 (two) times daily. Apply to intact skin of bilateral legs and feet twice daily. DO NOT PLACE IN BETWEEN TOES. Patient not taking: Reported on 04/12/2023 12/10/22   Rodolph Bong, MD  hydrocortisone 2.5 % cream Apply topically. 05/02/23   [provider]  losartan (COZAAR) 25 MG tablet Take 25 mg by mouth daily.    [provider]  melatonin 3 MG TABS tablet Take 1 tablet (3 mg total) by mouth at bedtime as needed (insomnia). Patient not taking: Reported on 04/12/2023 12/10/22   Rodolph Bong, MD  MITIGARE 0.6 MG CAPS Take 0.6 mg by mouth every other day.    [provider]  mupirocin ointment (BACTROBAN) 2 % Apply 1 Application topically 2 (two) times daily. 05/14/23   Rising, Rebecca, PA-C  OVER THE COUNTER MEDICATION Take 3 capsules by mouth daily. Balance of Nature - [employer], Historical, MD  OVER THE COUNTER MEDICATION Take 3 capsules by mouth daily. Balance of Nature - Veggies    [provider]  OVER THE COUNTER MEDICATION Take 1 Scoop by mouth daily. AG1 Athletic [employer]  [provider]  sodium chloride 1 g tablet Take 1 g by mouth daily. 08/21/19   [provider]  triamcinolone cream (KENALOG) 0.1 % Apply topically 3 (three) times daily. To areas of psoariatic rash. Do not apply to face. Patient taking differently: Apply 1 Application topically 3 (three) times daily as needed (rash). To areas of psoariatic rash. Do not apply to face. 10/25/22   Johney Maine, MD  TYLENOL 500 MG tablet Take 500-1,000 mg by mouth every 6 (six) hours as needed for mild pain or headache.    [provider]      Allergies    Cephalexin, Shellfish-derived products, Clindamycin/lincomycin, Lincomycin, Clindamycin  hcl, Fosfomycin, Levofloxacin, Penicillamine, Prednisone, Sugar-protein-starch, Tape, Ciprofloxacin, Sulfa antibiotics, and Sulfites    Review of Systems   Review of Systems  Physical Exam Updated Vital Signs BP (!) 165/104 (BP Location: Right Arm)   Pulse 75   Temp 97.9 F (36.6 C) (Oral)   Resp 18   SpO2 100% Comment: Simultaneous filing. User may not have seen previous data. Physical Exam Vitals and nursing note reviewed.  Constitutional:      General: She is not in acute distress.    Appearance: Normal appearance.  HENT:     Head: Normocephalic and atraumatic.     Nose: Nose normal.     Mouth/Throat:     Mouth: Mucous membranes are moist.     Pharynx: Oropharynx is clear.  Eyes:     Extraocular Movements: Extraocular movements intact.  Cardiovascular:     Rate and Rhythm: Normal rate and regular rhythm.     Heart sounds: Normal heart sounds.  Pulmonary:     Effort: Pulmonary effort is normal.     Breath sounds: Normal breath sounds.  Abdominal:     General: Abdomen is flat.     Palpations: Abdomen is soft.     Tenderness: There is abdominal tenderness (Suprapubic). There is no guarding or rebound.  Musculoskeletal:        General: Normal range of motion.     Cervical back: Normal range of motion.  Skin:    General: Skin is warm and dry.  Neurological:     General: No focal deficit present.     Mental Status: She is alert and oriented to person, place, and time.  Psychiatric:        Mood and Affect: Mood normal.        Behavior: Behavior normal.     ED Results / Procedures / Treatments   Labs (all labs ordered are listed, but only abnormal results are displayed) Labs Reviewed  URINALYSIS, W/ REFLEX TO CULTURE (INFECTION SUSPECTED) - Abnormal; Notable for the following components:      Result Value   APPearance HAZY (*)    Protein, ur 30 (*)    Leukocytes,Ua TRACE (*)    All other components within normal limits  COMPREHENSIVE METABOLIC PANEL - Abnormal;  Notable for the following components:   Sodium 124 (*)    Chloride 92 (*)    BUN 34 (*)    Albumin 3.1 (*)    GFR, Estimated 54 (*)    All other components within normal limits  CBC WITH DIFFERENTIAL/PLATELET - Abnormal; Notable for the following components:   RBC 3.36 (*)    Hemoglobin 8.7 (*)    HCT 27.8 (*)    MCH 25.9 (*)    RDW 16.9 (*)    All other components within normal limits  EKG None  Radiology No results found.  Procedures Procedures    Medications Ordered in ED Medications  sodium chloride 0.9 % bolus 500 mL (has no administration in time range)    ED Course/ Medical Decision Making/ A&P Clinical Course as of 06/03/23 2228  Fri Jun 03, 2023  2143 Acute worsening of hyponatremia, will be given fluids. UA is pending. [VK]  2157 Trace leuks but no bacteria making UTI unlikely. Suspect may have some dehydration resulting in the decreased urination today. [VK]    Clinical Course User Index [VK] Rexford Maus, DO                                 Medical Decision Making This patient presents to the ED with chief complaint(s) of decreased urination with pertinent past medical history of CHF, CKD which further complicates the presenting complaint. The complaint involves an extensive differential diagnosis and also carries with it a high risk of complications and morbidity.    The differential diagnosis includes urinary retention, UTI, AKI, dehydration, electrolyte abnormality, constipation, other intra-abdominal infection  Additional history obtained: Additional history obtained from N/A Records reviewed nursing home records, outpatient podiatry and hematology records  ED Course and Reassessment: On patient's arrival she is hemodynamically stable in no acute distress.  Bladder scan on arrival showed only 40 mL in the bladder.  Patient will have labs and urine performed here and will be closely reassessed.  Independent labs interpretation:  The  following labs were independently interpreted: Acute on chronic hyponatremia, labs otherwise at baseline  Independent visualization of imaging: - N/A  Consultation: - Consulted or discussed management/test interpretation w/ external professional: N/A  Consideration for admission or further workup: Patient has no emergent conditions requiring admission or further work-up at this time and is stable for discharge home with primary care follow-up  Social Determinants of health: N/A    Amount and/or Complexity of Data Reviewed Labs: ordered.          Final Clinical Impression(s) / ED Diagnoses Final diagnoses:  Hyponatremia    Rx / DC Orders ED Discharge Orders     None         Rexford Maus, DO 06/03/23 2228

## 2023-06-03 NOTE — ED Triage Notes (Addendum)
 Pt arrived via EMS from [facility] w/ c/o of urinary retention that started today. Pt reports hx of urinary retention. Pt reports mild abdominal pain and urgency even after eliminating. Denies dysuria, fevers, and N/V/D.  Pt on 2L at baseline.                                             CBG 120 HR 80

## 2023-06-03 NOTE — ED Notes (Signed)
 Bladder scan was about 40 ml

## 2023-06-06 DIAGNOSIS — R2689 Other abnormalities of gait and mobility: Secondary | ICD-10-CM | POA: Diagnosis not present

## 2023-06-06 DIAGNOSIS — M25552 Pain in left hip: Secondary | ICD-10-CM | POA: Diagnosis not present

## 2023-06-06 DIAGNOSIS — M6259 Muscle wasting and atrophy, not elsewhere classified, multiple sites: Secondary | ICD-10-CM | POA: Diagnosis not present

## 2023-06-06 DIAGNOSIS — R2681 Unsteadiness on feet: Secondary | ICD-10-CM | POA: Diagnosis not present

## 2023-06-06 DIAGNOSIS — R488 Other symbolic dysfunctions: Secondary | ICD-10-CM | POA: Diagnosis not present

## 2023-06-07 ENCOUNTER — Emergency Department (HOSPITAL_COMMUNITY)

## 2023-06-07 ENCOUNTER — Inpatient Hospital Stay (HOSPITAL_COMMUNITY)
Admission: EM | Admit: 2023-06-07 | Discharge: 2023-06-13 | DRG: 641 | Disposition: A | Discharge status: Nursing Home (Includes ICF) | Source: Skilled Nursing Facility | Attending: Internal Medicine | Admitting: Internal Medicine

## 2023-06-07 DIAGNOSIS — R627 Adult failure to thrive: Secondary | ICD-10-CM | POA: Diagnosis not present

## 2023-06-07 DIAGNOSIS — M109 Gout, unspecified: Secondary | ICD-10-CM | POA: Diagnosis present

## 2023-06-07 DIAGNOSIS — J9610 Chronic respiratory failure, unspecified whether with hypoxia or hypercapnia: Secondary | ICD-10-CM | POA: Diagnosis present

## 2023-06-07 DIAGNOSIS — R531 Weakness: Secondary | ICD-10-CM | POA: Diagnosis not present

## 2023-06-07 DIAGNOSIS — R5381 Other malaise: Secondary | ICD-10-CM | POA: Diagnosis not present

## 2023-06-07 DIAGNOSIS — R918 Other nonspecific abnormal finding of lung field: Secondary | ICD-10-CM | POA: Diagnosis not present

## 2023-06-07 DIAGNOSIS — N183 Chronic kidney disease, stage 3 unspecified: Secondary | ICD-10-CM | POA: Diagnosis not present

## 2023-06-07 DIAGNOSIS — R2681 Unsteadiness on feet: Secondary | ICD-10-CM | POA: Diagnosis not present

## 2023-06-07 DIAGNOSIS — I130 Hypertensive heart and chronic kidney disease with heart failure and stage 1 through stage 4 chronic kidney disease, or unspecified chronic kidney disease: Secondary | ICD-10-CM | POA: Diagnosis present

## 2023-06-07 DIAGNOSIS — Z87891 Personal history of nicotine dependence: Secondary | ICD-10-CM

## 2023-06-07 DIAGNOSIS — I5032 Chronic diastolic (congestive) heart failure: Secondary | ICD-10-CM | POA: Diagnosis present

## 2023-06-07 DIAGNOSIS — Z91013 Allergy to seafood: Secondary | ICD-10-CM | POA: Diagnosis not present

## 2023-06-07 DIAGNOSIS — Z66 Do not resuscitate: Secondary | ICD-10-CM | POA: Diagnosis present

## 2023-06-07 DIAGNOSIS — Z79899 Other long term (current) drug therapy: Secondary | ICD-10-CM | POA: Diagnosis not present

## 2023-06-07 DIAGNOSIS — J849 Interstitial pulmonary disease, unspecified: Secondary | ICD-10-CM | POA: Diagnosis not present

## 2023-06-07 DIAGNOSIS — Z888 Allergy status to other drugs, medicaments and biological substances status: Secondary | ICD-10-CM

## 2023-06-07 DIAGNOSIS — I712 Thoracic aortic aneurysm, without rupture, unspecified: Secondary | ICD-10-CM | POA: Diagnosis not present

## 2023-06-07 DIAGNOSIS — D631 Anemia in chronic kidney disease: Secondary | ICD-10-CM | POA: Diagnosis not present

## 2023-06-07 DIAGNOSIS — Z7401 Bed confinement status: Secondary | ICD-10-CM | POA: Diagnosis not present

## 2023-06-07 DIAGNOSIS — I10 Essential (primary) hypertension: Secondary | ICD-10-CM | POA: Diagnosis not present

## 2023-06-07 DIAGNOSIS — Z85828 Personal history of other malignant neoplasm of skin: Secondary | ICD-10-CM | POA: Diagnosis not present

## 2023-06-07 DIAGNOSIS — Z90710 Acquired absence of both cervix and uterus: Secondary | ICD-10-CM

## 2023-06-07 DIAGNOSIS — E871 Hypo-osmolality and hyponatremia: Secondary | ICD-10-CM | POA: Diagnosis not present

## 2023-06-07 DIAGNOSIS — R0602 Shortness of breath: Principal | ICD-10-CM

## 2023-06-07 DIAGNOSIS — Z8249 Family history of ischemic heart disease and other diseases of the circulatory system: Secondary | ICD-10-CM | POA: Diagnosis not present

## 2023-06-07 DIAGNOSIS — Z881 Allergy status to other antibiotic agents status: Secondary | ICD-10-CM

## 2023-06-07 DIAGNOSIS — R1310 Dysphagia, unspecified: Secondary | ICD-10-CM | POA: Diagnosis present

## 2023-06-07 DIAGNOSIS — Z882 Allergy status to sulfonamides status: Secondary | ICD-10-CM | POA: Diagnosis not present

## 2023-06-07 DIAGNOSIS — D638 Anemia in other chronic diseases classified elsewhere: Secondary | ICD-10-CM | POA: Diagnosis present

## 2023-06-07 DIAGNOSIS — Z96653 Presence of artificial knee joint, bilateral: Secondary | ICD-10-CM | POA: Diagnosis not present

## 2023-06-07 DIAGNOSIS — Z96642 Presence of left artificial hip joint: Secondary | ICD-10-CM | POA: Diagnosis present

## 2023-06-07 DIAGNOSIS — M6259 Muscle wasting and atrophy, not elsewhere classified, multiple sites: Secondary | ICD-10-CM | POA: Diagnosis not present

## 2023-06-07 DIAGNOSIS — M25552 Pain in left hip: Secondary | ICD-10-CM | POA: Diagnosis not present

## 2023-06-07 DIAGNOSIS — Z823 Family history of stroke: Secondary | ICD-10-CM | POA: Diagnosis not present

## 2023-06-07 DIAGNOSIS — R2689 Other abnormalities of gait and mobility: Secondary | ICD-10-CM | POA: Diagnosis not present

## 2023-06-07 DIAGNOSIS — R488 Other symbolic dysfunctions: Secondary | ICD-10-CM | POA: Diagnosis not present

## 2023-06-07 LAB — CBC WITH DIFFERENTIAL/PLATELET
Abs Immature Granulocytes: 0.07 10*3/uL (ref 0.00–0.07)
Basophils Absolute: 0.1 10*3/uL (ref 0.0–0.1)
Basophils Relative: 2 %
Eosinophils Absolute: 0.3 10*3/uL (ref 0.0–0.5)
Eosinophils Relative: 4 %
HCT: 28.6 % — ABNORMAL LOW (ref 36.0–46.0)
Hemoglobin: 9.2 g/dL — ABNORMAL LOW (ref 12.0–15.0)
Immature Granulocytes: 1 %
Lymphocytes Relative: 12 %
Lymphs Abs: 0.8 10*3/uL (ref 0.7–4.0)
MCH: 26 pg (ref 26.0–34.0)
MCHC: 32.2 g/dL (ref 30.0–36.0)
MCV: 80.8 fL (ref 80.0–100.0)
Monocytes Absolute: 0.9 10*3/uL (ref 0.1–1.0)
Monocytes Relative: 13 %
Neutro Abs: 4.7 10*3/uL (ref 1.7–7.7)
Neutrophils Relative %: 68 %
Platelets: 270 10*3/uL (ref 150–400)
RBC: 3.54 MIL/uL — ABNORMAL LOW (ref 3.87–5.11)
RDW: 16.8 % — ABNORMAL HIGH (ref 11.5–15.5)
WBC: 6.8 10*3/uL (ref 4.0–10.5)
nRBC: 0 % (ref 0.0–0.2)

## 2023-06-07 LAB — URINALYSIS, ROUTINE W REFLEX MICROSCOPIC
Bacteria, UA: NONE SEEN
Bilirubin Urine: NEGATIVE
Glucose, UA: NEGATIVE mg/dL
Hgb urine dipstick: NEGATIVE
Ketones, ur: NEGATIVE mg/dL
Leukocytes,Ua: NEGATIVE
Nitrite: NEGATIVE
Protein, ur: 30 mg/dL — AB
Specific Gravity, Urine: 1.008 (ref 1.005–1.030)
pH: 7 (ref 5.0–8.0)

## 2023-06-07 LAB — COMPREHENSIVE METABOLIC PANEL
ALT: 22 U/L (ref 0–44)
AST: 28 U/L (ref 15–41)
Albumin: 3.2 g/dL — ABNORMAL LOW (ref 3.5–5.0)
Alkaline Phosphatase: 86 U/L (ref 38–126)
Anion gap: 9 (ref 5–15)
BUN: 24 mg/dL — ABNORMAL HIGH (ref 8–23)
CO2: 27 mmol/L (ref 22–32)
Calcium: 9 mg/dL (ref 8.9–10.3)
Chloride: 82 mmol/L — ABNORMAL LOW (ref 98–111)
Creatinine, Ser: 0.8 mg/dL (ref 0.44–1.00)
GFR, Estimated: >60 mL/min (ref >60)
Glucose, Bld: 100 mg/dL — ABNORMAL HIGH (ref 70–99)
Potassium: 4.4 mmol/L (ref 3.5–5.1)
Sodium: 118 mmol/L — CL (ref 135–145)
Total Bilirubin: 0.4 mg/dL (ref 0.0–1.2)
Total Protein: 7.7 g/dL (ref 6.5–8.1)

## 2023-06-07 LAB — OSMOLALITY: Osmolality: 262 mosm/kg — ABNORMAL LOW (ref 275–295)

## 2023-06-07 LAB — OSMOLALITY, URINE: Osmolality, Ur: 256 mosm/kg — ABNORMAL LOW (ref 300–900)

## 2023-06-07 LAB — MAGNESIUM: Magnesium: 1.9 mg/dL (ref 1.7–2.4)

## 2023-06-07 MED ORDER — LOSARTAN POTASSIUM 25 MG PO TABS
25.0000 mg | ORAL_TABLET | Freq: Every day | ORAL | Status: DC
  Administered 2023-06-07 – 2023-06-11 (×5): 25 mg via ORAL
  Filled 2023-06-07 (×5): qty 1

## 2023-06-07 MED ORDER — DILTIAZEM HCL ER COATED BEADS 120 MG PO CP24: 120.0000 mg | ORAL_CAPSULE | Freq: Every day | ORAL | Status: DC

## 2023-06-07 MED ORDER — SODIUM CHLORIDE 3 % IV BOLUS
150.0000 mL | Freq: Once | INTRAVENOUS | Status: AC
  Administered 2023-06-07: 150 mL via INTRAVENOUS
  Filled 2023-06-07: qty 500

## 2023-06-07 MED ORDER — CARVEDILOL 3.125 MG PO TABS
3.1250 mg | ORAL_TABLET | Freq: Two times a day (BID) | ORAL | Status: DC
  Administered 2023-06-07 – 2023-06-13 (×12): 3.125 mg via ORAL
  Filled 2023-06-07 (×12): qty 1

## 2023-06-07 MED ORDER — MIRTAZAPINE 7.5 MG PO TABS
7.5000 mg | ORAL_TABLET | Freq: Every day | ORAL | Status: DC
  Administered 2023-06-07: 7.5 mg via ORAL
  Filled 2023-06-07: qty 1

## 2023-06-07 MED ORDER — HYDRALAZINE HCL 20 MG/ML IJ SOLN
10.0000 mg | Freq: Once | INTRAMUSCULAR | Status: AC | PRN
  Administered 2023-06-07: 10 mg via INTRAVENOUS
  Filled 2023-06-07: qty 1

## 2023-06-07 MED ORDER — SODIUM CHLORIDE 3 % IV BOLUS
250.0000 mL | Freq: Once | INTRAVENOUS | Status: DC
  Filled 2023-06-07: qty 500

## 2023-06-07 MED ORDER — SODIUM CHLORIDE 0.9 % IV SOLN: INTRAVENOUS | Status: DC

## 2023-06-07 MED ORDER — ONDANSETRON HCL 4 MG/2ML IJ SOLN: 4.0000 mg | Freq: Four times a day (QID) | INTRAMUSCULAR | Status: DC | PRN

## 2023-06-07 MED ORDER — ACETAMINOPHEN 650 MG RE SUPP: 650.0000 mg | Freq: Four times a day (QID) | RECTAL | Status: DC | PRN

## 2023-06-07 MED ORDER — ACETAMINOPHEN 325 MG PO TABS
650.0000 mg | ORAL_TABLET | Freq: Four times a day (QID) | ORAL | Status: DC | PRN
  Administered 2023-06-08 – 2023-06-11 (×3): 650 mg via ORAL
  Filled 2023-06-07 (×4): qty 2

## 2023-06-07 MED ORDER — MUPIROCIN 2 % EX OINT: 1.0000 | TOPICAL_OINTMENT | Freq: Two times a day (BID) | CUTANEOUS | Status: DC

## 2023-06-07 MED ORDER — ONDANSETRON HCL 4 MG PO TABS: 4.0000 mg | ORAL_TABLET | Freq: Four times a day (QID) | ORAL | Status: DC | PRN

## 2023-06-07 MED ORDER — DILTIAZEM HCL ER COATED BEADS 180 MG PO CP24
180.0000 mg | ORAL_CAPSULE | Freq: Every day | ORAL | Status: DC
  Administered 2023-06-08 – 2023-06-13 (×6): 180 mg via ORAL
  Filled 2023-06-07 (×6): qty 1

## 2023-06-07 MED ORDER — DOXYCYCLINE HYCLATE 100 MG PO TABS: 100.0000 mg | ORAL_TABLET | Freq: Two times a day (BID) | ORAL | Status: DC

## 2023-06-07 MED ORDER — SODIUM CHLORIDE 1 G PO TABS
1.0000 g | ORAL_TABLET | Freq: Once | ORAL | Status: AC
  Administered 2023-06-07: 1 g via ORAL
  Filled 2023-06-07: qty 1

## 2023-06-07 NOTE — ED Provider Notes (Signed)
 [place] EMERGENCY DEPARTMENT AT [facility] Provider Note   CSN: [id number] Arrival date & time: 06/07/23  1242     History  Chief Complaint  Patient presents with  . Shortness of Breath  . Failure To Thrive    [name] is a 89 y.o. female.  With a history of hypertension, anemia, CKD, heart failure, myocarditis, recurrent pericarditis and chronic hyponatremia who presents to the ED for generalized weakness.  Patient resides at [facility] at [facility] where the staff was concerned for increased shortness of breath and weakness over the last 2 days.  She was recently seen for urinary retention but EMS reports she urinated directly prior to arrival.  Upon initial evaluation patient herself has no systemic complaints and is not certain why she is here today.  She denies chest pain shortness of breath nausea vomiting fevers and chills.  No abdominal pain currently   Shortness of Breath      Home Medications Prior to Admission medications   Medication Sig Start Date End Date Taking? Authorizing Provider  allopurinol (ZYLOPRIM) 300 MG tablet Take 300 mg by mouth daily.    [provider]  amoxicillin (AMOXIL) 500 MG capsule Take 2,000 mg by mouth See admin instructions. Take 2,000 mg by mouth one hour prior to dental procedures    [provider]  B Complex Vitamins (VITAMIN B COMPLEX) TABS Take 1 tablet by mouth daily with breakfast.    [provider]  carvedilol (COREG) 3.125 MG tablet Take 3.125 mg by mouth 2 (two) times daily with a meal.    [provider]  Cholecalciferol (VITAMIN D3) 25 MCG (1000 UT) CAPS Take 1,000 Units by mouth daily.    [provider]  diltiazem (CARDIZEM CD) 120 MG 24 hr capsule Take 120 mg by mouth daily.    [provider]  doxycycline (VIBRA-TABS) 100 MG tablet Take 1 tablet (100 mg total) by mouth 2 (two) times daily. 06/02/23   Hyatt, Max T, DPM  FEROSUL 325 (65 Fe) MG  tablet Take 650 mg by mouth daily with breakfast.    [provider]  furosemide (LASIX) 20 MG tablet Take 1 tablet (20 mg total) by mouth every other day. 04/11/22 04/12/23  Pokhrel, Rebekah Chesterfield, MD  hydrocerin (EUCERIN) CREA Apply 1 Application topically 2 (two) times daily. Apply to intact skin of bilateral legs and feet twice daily. DO NOT PLACE IN BETWEEN TOES. Patient not taking: Reported on 04/12/2023 12/10/22   Rodolph Bong, MD  hydrocortisone 2.5 % cream Apply topically. 05/02/23   [provider]  losartan (COZAAR) 25 MG tablet Take 25 mg by mouth daily.    [provider]  melatonin 3 MG TABS tablet Take 1 tablet (3 mg total) by mouth at bedtime as needed (insomnia). Patient not taking: Reported on 04/12/2023 12/10/22   Rodolph Bong, MD  MITIGARE 0.6 MG CAPS Take 0.6 mg by mouth every other day.    [provider]  mupirocin ointment (BACTROBAN) 2 % Apply 1 Application topically 2 (two) times daily. 05/14/23   Rising, Rebecca, PA-C  OVER THE COUNTER MEDICATION Take 3 capsules by mouth daily. Balance of Nature - [employer], Historical, MD  OVER THE COUNTER MEDICATION Take 3 capsules by mouth daily. Balance of Nature - Veggies    [provider]  OVER THE COUNTER MEDICATION Take 1 Scoop by mouth daily. AG1 [employer]    [provider]  sodium chloride 1 g tablet Take 1 g by mouth daily. 08/21/19   [provider]  triamcinolone cream (KENALOG) 0.1 % Apply topically 3 (three) times daily. To areas of psoariatic rash. Do not apply to face. Patient taking differently: Apply 1 Application topically 3 (three) times daily as needed (rash). To areas of psoariatic rash. Do not apply to face. 10/25/22   Johney Maine, MD  TYLENOL 500 MG tablet Take 500-1,000 mg by mouth every 6 (six) hours as needed for mild pain or headache.    [provider]      Allergies    Cephalexin, Shellfish-derived products,  Clindamycin/lincomycin, Lincomycin, Clindamycin hcl, Fosfomycin, Levofloxacin, Penicillamine, Prednisone, Sugar-protein-starch, Tape, Ciprofloxacin, Sulfa antibiotics, and Sulfites    Review of Systems   Review of Systems  Respiratory:  Positive for shortness of breath.     Physical Exam Updated Vital Signs BP (!) 167/72 (BP Location: Left Arm)   Pulse 66   Temp (!) 97.5 F (36.4 C)   Resp (!) 21   SpO2 100%  Physical Exam Vitals and nursing note reviewed.  HENT:     Head: Normocephalic and atraumatic.  Eyes:     Pupils: Pupils are equal, round, and reactive to light.  Cardiovascular:     Rate and Rhythm: Normal rate and regular rhythm.  Pulmonary:     Effort: Pulmonary effort is normal.     Breath sounds: Normal breath sounds.  Abdominal:     Palpations: Abdomen is soft.     Tenderness: There is no abdominal tenderness.  Skin:    General: Skin is warm and dry.  Neurological:     General: No focal deficit present.     Mental Status: She is alert.  Psychiatric:        Mood and Affect: Mood normal.     ED Results / Procedures / Treatments   Labs (all labs ordered are listed, but only abnormal results are displayed) Labs Reviewed  CBC WITH DIFFERENTIAL/PLATELET - Abnormal; Notable for the following components:      Result Value   RBC 3.54 (*)    Hemoglobin 9.2 (*)    HCT 28.6 (*)    RDW 16.8 (*)    All other components within normal limits  COMPREHENSIVE METABOLIC PANEL - Abnormal; Notable for the following components:   Sodium 118 (*)    Chloride 82 (*)    Glucose, Bld 100 (*)    BUN 24 (*)    Albumin 3.2 (*)    All other components within normal limits  URINALYSIS, ROUTINE W REFLEX MICROSCOPIC - Abnormal; Notable for the following components:   APPearance HAZY (*)    Protein, ur 30 (*)    All other components within normal limits  MAGNESIUM  OSMOLALITY, URINE  OSMOLALITY    EKG None  Radiology No results found.  Procedures Procedures     Medications Ordered in ED Medications  sodium chloride 3% (hypertonic) IV bolus 150 mL (has no administration in time range)    ED Course/ Medical Decision Making/ A&P Clinical Course as of 06/07/23 1526  Tue Jun 07, 2023  1451 Laboratory workup notable for critical hyponatremia of 118.  Will provide hypertonic bolus given that she is symptomatic.  UA negative.  No leukocytosis.  Chest x-ray unremarkable. [MP]  1526 Discussed with admitting hospitalist accepts patient for admission [MP]    Clinical Course User Index [MP] Royanne Foots, DO  Medical Decision Making 89 year old female with history as above sent in from assisted living facility given concern for increased generalized weakness and shortness of breath.  She is afebrile slightly hypertensive on exam.  Benign physical exam without focal neurologic deficits.  Differential diagnosis for her weakness and shortness of breath would be pneumonia, viral respiratory illness, heart failure exacerbation, electrolyte imbalance, anemia and dysrhythmia.  Will obtain laboratory workup EKG chest x-ray and continue to monitor on telemetry.  Amount and/or Complexity of Data Reviewed Labs: ordered. Radiology: ordered.  Risk Prescription drug management.           Final Clinical Impression(s) / ED Diagnoses Final diagnoses:  Shortness of breath  Hyponatremia  Weakness    Rx / DC Orders ED Discharge Orders     None         Royanne Foots, DO 06/07/23 1452

## 2023-06-07 NOTE — ED Triage Notes (Signed)
 Per EMS from [place] at [facility]. Recent hospital stay. Failure to thrive for past fews days. C/o SOB and weakness. A&O x4.   O2 97 on 2L baseline BP 150/80 RR 16 HR 74 CBG 156

## 2023-06-07 NOTE — ED Notes (Signed)
 Patient changed and readjusted in bed for comfort

## 2023-06-07 NOTE — H&P (Signed)
 History and Physical    Patient: [name] RUE:[id number] DOB: Oct 18, 1929 DOA: 06/07/2023 DOS: the patient was seen and examined on 06/07/2023 PCP: Darlene Aspen, MD  Patient coming from: Home  Chief Complaint:  Chief Complaint  Patient presents with   Shortness of Breath   Failure To Thrive   HPI: [name] is a 89 y.o. female with medical history significant of chronic anemia, aortic insufficiency, osteoarthritis, stage II CKD skin cancer, depression, dyspnea, essential hypertension, gait abnormality, GERD, myocarditis, PSVT, recurrent pericarditis, thoracic aneurysm, chronic hyponatremia who presented to the emergency department from her nursing facility due to dyspnea and failure to thrive.  She has been taking furosemide every other day.  Her appetite has been decreased.  She has lost 8 pounds easier.  She denied fever, chills, sore throat, wheezing or hemoptysis but has had some headache and congestion.  No chest pain, palpitations, diaphoresis, PND, orthopnea but has frequent pitting edema of the lower extremities.  No abdominal pain, nausea, emesis, diarrhea, melena or hematochezia.  No flank pain, dysuria, frequency or hematuria.  No polyuria, polydipsia, polyphagia or blurred vision.   Lab work: CBC showed white count 6.9, hemoglobin 9.2 g deciliter platelets 270.  Magnesium was 1.8 mg/dL.  CMP showed a sodium 118, potassium 4.4, chloride 82 and CO2 27 mmol/L.  Glucose was 100 and BUN 24 mg/dL.  Total protein was 3.2 g/dL, creatinine, calcium and the rest of the LFTs were normal.  Serum and urine osmolality are pending.  Imaging: Portable 1 view chest radiograph showing stable diffuse chronic lung opacities most consistent with chronic interstitial lung disease.   ED course: Initial vital signs were temperature 97.5 F, pulse 66, respirations 21, BP 167/72 mmHg O2 sat in 100% on nasal cannula 2 LPM.  Review of Systems: As mentioned in the history of present  illness. All other systems reviewed and are negative. Past Medical History:  Diagnosis Date   Anemia    CHRONIC   Aortic insufficiency    a. 2D echo 08/29/15: EF 55-60%, mild LVH, diastolic dysfunction, elevated LV filling pressure, mild AI, severe LAE, mild RAE, mild TR, dilated descending thoracic aorta just distal to the takeoff of the left subclavian, measuring 4.1 cm, no pericardial effusion.   Arthritis    OA-SOME BACK AND NECK PAIN,  GOES TO CHIROPRACTOR TWICE A MONTH;  HX OF JOINT REPLACEMENTS    Cancer (HCC)    SKIN CANCERS REMOVED FROM LEGS   CKD (chronic kidney disease), stage II    Depression    Dyspnea    Essential hypertension    Gait abnormality 09/08/2017   GERD (gastroesophageal reflux disease)    Low sodium levels    Myocarditis (HCC)    PSVT (paroxysmal supraventricular tachycardia) (HCC)    Recurrent Pericarditis    RECURRENT    Thoracic aneurysm    a. Followed by [facility] -last seen in 12/2013 w/ plan for f/u MRA and thoracic surgery f/u in 2 yrs, but patient does not wish to follow any longer.   Past Surgical History:  Procedure Laterality Date   APPENDECTOMY     BREAST EXCISIONAL BIOPSY Left 1992   benign   EXCISION/RELEASE BURSA HIP Left 11/24/2012   Procedure: LEFT HIP BURSECTOMY AND TENDON REPAIR ;  Surgeon: Loanne Drilling, MD;  Location: [facility];  Service: Orthopedics;  Laterality: Left;   HIP CLOSED REDUCTION Left 12/19/2012   Procedure: CLOSED MANIPULATION HIP;  Surgeon: Shelda Pal, MD;  Location: [facility];  Service: Orthopedics;  Laterality: Left;   HIP CLOSED REDUCTION Left 12/05/2013   Procedure: CLOSED MANIPULATION HIP;  Surgeon: Verlee Rossetti, MD;  Location: [facility];  Service: Orthopedics;  Laterality: Left;   HIP CLOSED REDUCTION Left 09/22/2015   Procedure: CLOSED REDUCTION HIP;  Surgeon: Venita Lick, MD;  Location: [facility];  Service: Orthopedics;  Laterality: Left;   HIP CLOSED REDUCTION Left 12/05/2022   Procedure: CLOSED MANIPULATION HIP;   Surgeon: Ollen Gross, MD;  Location: [facility];  Service: Orthopedics;  Laterality: Left;   HIP CLOSED REDUCTION Left 04/12/2023   Procedure: CLOSED MANIPULATION HIP;  Surgeon: Toni Arthurs, MD;  Location: [facility];  Service: Orthopedics;  Laterality: Left;   JOINT REPLACEMENT     LEFT TOTAL HIP REPLACEMENT AND REVISIONS X 2   OOPHORECTOMY     has a partial of one ovary remaingin   PELVIC LAPAROSCOPY     ovarian cyst removal,    REVISION TOTAL HIP ARTHROPLASTY     left   right Achilles Tendon repair  5/13   TOTAL KNEE ARTHROPLASTY     right   TOTAL KNEE ARTHROPLASTY     left   VAGINAL HYSTERECTOMY     with ovarian cyst removal   Social History:  reports that she quit smoking about 60 years ago. Her smoking use included cigarettes. She started smoking about 65 years ago. She has never used smokeless tobacco. She reports that she does not currently use drugs. She reports that she does not drink alcohol.  Allergies  Allergen Reactions   Cephalexin Other (See Comments)    Pt states this caused eye problems and STROKE-LIKE SYMPTOMS!!   Shellfish-Derived Products Hives and Rash   Clindamycin/Lincomycin Rash   Lincomycin Rash   Clindamycin Hcl Other (See Comments)    "Per pharmacist"   Fosfomycin Other (See Comments)    Disorientation   Levofloxacin Other (See Comments)    Reaction not recalled by the patient   Penicillamine Other (See Comments)    Reaction not recalled by the patient   Prednisone    Sugar-Protein-Starch Other (See Comments)    Sugary foods cause legs to cramp   Tape Other (See Comments)    Please use paper tape or Coban Wrap; patient's skin tears VERY easily!!   Ciprofloxacin Rash   Sulfa Antibiotics Hives and Rash   Sulfites Hives, Swelling and Rash    Family History  Problem Relation Age of Onset   Hypertension Mother    Stroke Mother    Heart attack Father 25   Heart failure Father    Heart failure Sister    Aortic aneurysm Sister    Aortic aneurysm  Sister    Stroke Sister    Pneumonia Brother    Stroke Brother    ALS Other    Hyperlipidemia Daughter    Aortic aneurysm Son    Breast cancer Neg Hx     Prior to Admission medications   Medication Sig Start Date End Date Taking? Authorizing Provider  allopurinol (ZYLOPRIM) 300 MG tablet Take 300 mg by mouth daily.    [provider]  amoxicillin (AMOXIL) 500 MG capsule Take 2,000 mg by mouth See admin instructions. Take 2,000 mg by mouth one hour prior to dental procedures    [provider]  B Complex Vitamins (VITAMIN B COMPLEX) TABS Take 1 tablet by mouth daily with breakfast.    [provider]  carvedilol (COREG) 3.125 MG tablet Take 3.125 mg by  mouth 2 (two) times daily with a meal.    [provider]  Cholecalciferol (VITAMIN D3) 25 MCG (1000 UT) CAPS Take 1,000 Units by mouth daily.    [provider]  diltiazem (CARDIZEM CD) 120 MG 24 hr capsule Take 120 mg by mouth daily.    [provider]  doxycycline (VIBRA-TABS) 100 MG tablet Take 1 tablet (100 mg total) by mouth 2 (two) times daily. 06/02/23   Hyatt, Max T, DPM  FEROSUL 325 (65 Fe) MG tablet Take 650 mg by mouth daily with breakfast.    [provider]  furosemide (LASIX) 20 MG tablet Take 1 tablet (20 mg total) by mouth every other day. 04/11/22 04/12/23  Pokhrel, Rebekah Chesterfield, MD  hydrocerin (EUCERIN) CREA Apply 1 Application topically 2 (two) times daily. Apply to intact skin of bilateral legs and feet twice daily. DO NOT PLACE IN BETWEEN TOES. Patient not taking: Reported on 04/12/2023 12/10/22   Rodolph Bong, MD  hydrocortisone 2.5 % cream Apply topically. 05/02/23   [provider]  losartan (COZAAR) 25 MG tablet Take 25 mg by mouth daily.    [provider]  melatonin 3 MG TABS tablet Take 1 tablet (3 mg total) by mouth at bedtime as needed (insomnia). Patient not taking: Reported on 04/12/2023 12/10/22   Rodolph Bong, MD  MITIGARE 0.6 MG  CAPS Take 0.6 mg by mouth every other day.    [provider]  mupirocin ointment (BACTROBAN) 2 % Apply 1 Application topically 2 (two) times daily. 05/14/23   Rising, Rebecca, PA-C  OVER THE COUNTER MEDICATION Take 3 capsules by mouth daily. Balance of Nature - [employer], Historical, MD  OVER THE COUNTER MEDICATION Take 3 capsules by mouth daily. Balance of Nature - Veggies    [provider]  OVER THE COUNTER MEDICATION Take 1 Scoop by mouth daily. AG1 Athletic Greens    [provider]  sodium chloride 1 g tablet Take 1 g by mouth daily. 08/21/19   [provider]  triamcinolone cream (KENALOG) 0.1 % Apply topically 3 (three) times daily. To areas of psoariatic rash. Do not apply to face. Patient taking differently: Apply 1 Application topically 3 (three) times daily as needed (rash). To areas of psoariatic rash. Do not apply to face. 10/25/22   Johney Maine, MD  TYLENOL 500 MG tablet Take 500-1,000 mg by mouth every 6 (six) hours as needed for mild pain or headache.    [provider]    Physical Exam: Vitals:   06/07/23 1319 06/07/23 1322  BP: (!) 167/72   Pulse: 66   Resp: (!) 21   Temp: (!) 97.5 F (36.4 C)   SpO2: 100% 100%   Physical Exam Vitals reviewed.  Constitutional:      General: She is awake. She is not in acute distress.    Appearance: She is well-developed and normal weight. She is ill-appearing.     Interventions: Nasal cannula in place.  HENT:     Head: Normocephalic.     Nose: No rhinorrhea.     Mouth/Throat:     Mouth: Mucous membranes are moist.  Eyes:     General: No scleral icterus.    Pupils: Pupils are equal, round, and reactive to light.  Neck:     Vascular: No JVD.  Cardiovascular:     Heart sounds: S1 normal and S2 normal.  Pulmonary:     Effort: No tachypnea.  Breath sounds: No wheezing, rhonchi or rales.  Abdominal:     General: Bowel sounds are normal. There is no distension.      Palpations: Abdomen is soft.     Tenderness: There is no abdominal tenderness. There is no guarding.  Musculoskeletal:     Cervical back: Neck supple.     Right lower leg: Edema present.     Left lower leg: Edema present.     Comments: Mild to moderate generalized weakness.  Skin:    General: Skin is warm and dry.  Neurological:     General: No focal deficit present.     Mental Status: She is alert.  Psychiatric:        Mood and Affect: Mood normal.        Behavior: Behavior normal. Behavior is cooperative.     Data Reviewed:  Results are pending, will review when available. 04/09/2022 echocardiogram report. IMPRESSIONS:   1. Left ventricular ejection fraction, by estimation, is 60 to 65%. The  left ventricle has normal function. The left ventricle has no regional  wall motion abnormalities. Left ventricular diastolic parameters are  consistent with Grade II diastolic  dysfunction (pseudonormalization). Elevated left ventricular end-diastolic  pressure.   2. Right ventricular systolic function is normal. The right ventricular  size is normal. There is normal pulmonary artery systolic pressure.   3. Left atrial size was severely dilated.   4. The mitral valve is abnormal. Trivial mitral valve regurgitation. No  evidence of mitral stenosis.   5. The aortic valve is tricuspid. There is mild calcification of the  aortic valve. There is mild thickening of the aortic valve. Aortic valve  regurgitation is mild to moderate. Aortic valve sclerosis is present, with  no evidence of aortic valve  stenosis.   6. Aortic dilatation noted. There is mild dilatation of the aortic root,  measuring 38 mm. There is moderate dilatation of the ascending aorta,  measuring 41 mm.   7. The inferior vena cava is normal in size with greater than 50%  respiratory variability, suggesting right atrial pressure of 3 mmHg.   EKG: Vent. rate 65 BPM PR interval 216 ms QRS duration 95 ms QT/QTcB  406/423 ms P-R-T axes 52 107 73 Age not entered, assumed to be 89 years old for purpose of ECG interpretation Sinus rhythm Prolonged PR interval Left atrial enlargement Anterolateral infarct, old Abnrm T, consider ischemia, anterolateral lds  Assessment and Plan: Principal Problem:   Generalized weakness Associated with:   Failure to thrive in adult In the setting of worsening of:   Chronic hyponatremia Admit to stepdown/inpatient. Gentle normal saline infusion. Sodium chloride 1 g p.o. x 1. Free water restriction. Follow-up sodium level closely. Trial of low-dose mirtazapine for appetite stimulation.  Active Problems:   Aneurysm of thoracic aorta (HCC) Follow-up with PCP or CTS as an outpatient.    Essential hypertension Continue carvedilol 3.125 mg p.o. twice daily. Continue losartan 25 mg p.o. daily. Continue diltiazem 120 mg p.o. daily.    Anemia of chronic disease Monitor hematocrit and hemoglobin. Transfuse as needed.    Chronic kidney disease, stage 3 unspecified (HCC) Monitor renal function electrolytes.    ILD (interstitial lung disease) (HCC) Continue supplemental oxygen. Bronchodilators as needed.    Chronic diastolic CHF (congestive heart failure) (HCC) No signs of decompensation. Continue beta-blocker and ARB.    Advance Care Planning:   Code Status: Do not attempt resuscitation (DNR) PRE-ARREST INTERVENTIONS DESIRED   Consults:   Family Communication:  Her daughter was at bedside.  Severity of Illness: The appropriate patient status for this patient is INPATIENT. Inpatient status is judged to be reasonable and necessary in order to provide the required intensity of service to ensure the patient's safety. The patient's presenting symptoms, physical exam findings, and initial radiographic and laboratory data in the context of their chronic comorbidities is felt to place them at high risk for further clinical deterioration. Furthermore, it is not  anticipated that the patient will be medically stable for discharge from the hospital within 2 midnights of admission.   * I certify that at the point of admission it is my clinical judgment that the patient will require inpatient hospital care spanning beyond 2 midnights from the point of admission due to high intensity of service, high risk for further deterioration and high frequency of surveillance required.*  Author: Bobette Mo, MD 06/07/2023 3:07 PM  For on call review [email or web address].   This document was prepared using Dragon voice recognition software and may contain some unintended transcription errors.

## 2023-06-08 ENCOUNTER — Encounter (HOSPITAL_COMMUNITY): Payer: Self-pay | Admitting: Internal Medicine

## 2023-06-08 ENCOUNTER — Other Ambulatory Visit: Payer: Self-pay

## 2023-06-08 DIAGNOSIS — E871 Hypo-osmolality and hyponatremia: Secondary | ICD-10-CM | POA: Diagnosis not present

## 2023-06-08 LAB — COMPREHENSIVE METABOLIC PANEL
ALT: 20 U/L (ref 0–44)
AST: 27 U/L (ref 15–41)
Albumin: 3.2 g/dL — ABNORMAL LOW (ref 3.5–5.0)
Alkaline Phosphatase: 79 U/L (ref 38–126)
Anion gap: 9 (ref 5–15)
BUN: 21 mg/dL (ref 8–23)
CO2: 25 mmol/L (ref 22–32)
Calcium: 9.1 mg/dL (ref 8.9–10.3)
Chloride: 91 mmol/L — ABNORMAL LOW (ref 98–111)
Creatinine, Ser: 0.7 mg/dL (ref 0.44–1.00)
GFR, Estimated: >60 mL/min (ref >60)
Glucose, Bld: 83 mg/dL (ref 70–99)
Potassium: 3.9 mmol/L (ref 3.5–5.1)
Sodium: 125 mmol/L — ABNORMAL LOW (ref 135–145)
Total Bilirubin: 0.5 mg/dL (ref 0.0–1.2)
Total Protein: 7.4 g/dL (ref 6.5–8.1)

## 2023-06-08 LAB — CBC
HCT: 30.2 % — ABNORMAL LOW (ref 36.0–46.0)
Hemoglobin: 9.6 g/dL — ABNORMAL LOW (ref 12.0–15.0)
MCH: 26 pg (ref 26.0–34.0)
MCHC: 31.8 g/dL (ref 30.0–36.0)
MCV: 81.8 fL (ref 80.0–100.0)
Platelets: 264 10*3/uL (ref 150–400)
RBC: 3.69 MIL/uL — ABNORMAL LOW (ref 3.87–5.11)
RDW: 17.1 % — ABNORMAL HIGH (ref 11.5–15.5)
WBC: 7.5 10*3/uL (ref 4.0–10.5)
nRBC: 0 % (ref 0.0–0.2)

## 2023-06-08 LAB — SODIUM
Sodium: 123 mmol/L — ABNORMAL LOW (ref 135–145)
Sodium: 122 mmol/L — ABNORMAL LOW (ref 135–145)

## 2023-06-08 LAB — SODIUM, URINE, RANDOM: Sodium, Ur: 40 mmol/L

## 2023-06-08 LAB — TSH: TSH: 4.097 u[IU]/mL (ref 0.350–4.500)

## 2023-06-08 MED ORDER — FERROUS SULFATE 325 (65 FE) MG PO TABS
325.0000 mg | ORAL_TABLET | Freq: Every day | ORAL | Status: DC
  Administered 2023-06-09 – 2023-06-13 (×5): 325 mg via ORAL
  Filled 2023-06-08 (×5): qty 1

## 2023-06-08 MED ORDER — SODIUM CHLORIDE 0.9 % IV SOLN: INTRAVENOUS | Status: DC

## 2023-06-08 MED ORDER — MELATONIN 3 MG PO TABS
3.0000 mg | ORAL_TABLET | Freq: Every day | ORAL | Status: DC
  Administered 2023-06-09 – 2023-06-12 (×4): 3 mg via ORAL
  Filled 2023-06-08 (×4): qty 1

## 2023-06-08 MED ORDER — ALLOPURINOL 300 MG PO TABS
300.0000 mg | ORAL_TABLET | Freq: Every day | ORAL | Status: DC
  Administered 2023-06-08 – 2023-06-13 (×6): 300 mg via ORAL
  Filled 2023-06-08: qty 1
  Filled 2023-06-08 (×2): qty 3
  Filled 2023-06-08: qty 1
  Filled 2023-06-08: qty 3
  Filled 2023-06-08: qty 1

## 2023-06-08 NOTE — ED Notes (Signed)
 Patient daughter wants remeron medication d/c'd due to her concern for patient kidney damage. Nurse educated patient daughter on medication and she still has a concern for further kidney damage.

## 2023-06-08 NOTE — Progress Notes (Signed)
 Clinical/Bedside Swallow Evaluation Patient Details  Name: KOLBY MYUNG MRN: [id number] Date of Birth: [birth year]  Today's Date: 06/08/2023 Time: SLP Start Time (ACUTE ONLY): 1031 SLP Stop Time (ACUTE ONLY): 1051 SLP Time Calculation (min) (ACUTE ONLY): 20 min  Past Medical History:  Past Medical History:  Diagnosis Date   Anemia    CHRONIC   Aortic insufficiency    a. 2D echo 08/29/15: EF 55-60%, mild LVH, diastolic dysfunction, elevated LV filling pressure, mild AI, severe LAE, mild RAE, mild TR, dilated descending thoracic aorta just distal to the takeoff of the left subclavian, measuring 4.1 cm, no pericardial effusion.   Arthritis    OA-SOME BACK AND NECK PAIN,  GOES TO CHIROPRACTOR TWICE A MONTH;  HX OF JOINT REPLACEMENTS    Cancer (HCC)    SKIN CANCERS REMOVED FROM LEGS   CKD (chronic kidney disease), stage II    Depression    Dyspnea    Essential hypertension    Gait abnormality 09/08/2017   GERD (gastroesophageal reflux disease)    Low sodium levels    Myocarditis (HCC)    PSVT (paroxysmal supraventricular tachycardia) (HCC)    Recurrent Pericarditis    RECURRENT    Thoracic aneurysm    a. Followed by [facility] -last seen in 12/2013 w/ plan for f/u MRA and thoracic surgery f/u in 2 yrs, but patient does not wish to follow any longer.   Past Surgical History:  Past Surgical History:  Procedure Laterality Date   APPENDECTOMY     BREAST EXCISIONAL BIOPSY Left 1992   benign   EXCISION/RELEASE BURSA HIP Left 11/24/2012   Procedure: LEFT HIP BURSECTOMY AND TENDON REPAIR ;  Surgeon: Loanne Drilling, MD;  Location: [facility];  Service: Orthopedics;  Laterality: Left;   HIP CLOSED REDUCTION Left 12/19/2012   Procedure: CLOSED MANIPULATION HIP;  Surgeon: Shelda Pal, MD;  Location: [facility];  Service: Orthopedics;  Laterality: Left;   HIP CLOSED REDUCTION Left 12/05/2013   Procedure: CLOSED MANIPULATION HIP;  Surgeon: Verlee Rossetti, MD;  Location: [facility];  Service: Orthopedics;   Laterality: Left;   HIP CLOSED REDUCTION Left 09/22/2015   Procedure: CLOSED REDUCTION HIP;  Surgeon: Venita Lick, MD;  Location: [facility];  Service: Orthopedics;  Laterality: Left;   HIP CLOSED REDUCTION Left 12/05/2022   Procedure: CLOSED MANIPULATION HIP;  Surgeon: Ollen Gross, MD;  Location: [facility];  Service: Orthopedics;  Laterality: Left;   HIP CLOSED REDUCTION Left 04/12/2023   Procedure: CLOSED MANIPULATION HIP;  Surgeon: Toni Arthurs, MD;  Location: [facility];  Service: Orthopedics;  Laterality: Left;   JOINT REPLACEMENT     LEFT TOTAL HIP REPLACEMENT AND REVISIONS X 2   OOPHORECTOMY     has a partial of one ovary remaingin   PELVIC LAPAROSCOPY     ovarian cyst removal,    REVISION TOTAL HIP ARTHROPLASTY     left   right Achilles Tendon repair  5/13   TOTAL KNEE ARTHROPLASTY     right   TOTAL KNEE ARTHROPLASTY     left   VAGINAL HYSTERECTOMY     with ovarian cyst removal   HPI:  Patient is a 89 year old female admitted to [place] [facility] with shortness of breath and failure to thrive.  Patient has past medical history significant for falls, cervical spondylosis, GERD, anemia, myocarditis, recurrent pericarditis.  Medication list from [facility] includes Tums per patient and she denies significant issues with swallowing.    Assessment / Plan / Recommendation  Clinical Impression  Patient presents with functional oral pharyngeal swallow ability based on limited clinical swallow evaluation of items that patient would consume.  No focal cranial nerve deficits apparent and patient denies any issues with dysphagia.  Upon oral exam she did appear with minimal amount of grits posterior lateral sulcus more on the left than the right requiring liquids to clear.  No sign or symptoms of aspiration.  Patient potentially with slightly discoordinated and multiple swallows with thin liquid which is likely within functional limits given her age.  She only took in a few bites and sips before stating she  did not want any more.  Doubt patient has any dysphagia that may contribute to failure to thrive.  Educated her to strategies to aid in posterior sulci oral clearance and posted swallow precautions signs. Spoke to RN with recommendations.  SLP will sign off.  Thank you for this consult SLP Visit Diagnosis: Dysphagia, unspecified (R13.10)    Aspiration Risk  Mild aspiration risk    Diet Recommendation Regular;Thin liquid    Liquid Administration via: Cup;Straw Medication Administration: Other (Comment) (As tolerated) Supervision: Comment (Set up assisted patient able to feed herself) Compensations: Slow rate;Small sips/bites (Check for oral retention) Postural Changes: Seated upright at 90 degrees;Remain upright for at least 30 minutes after po intake    Other  Recommendations      Recommendations for follow up therapy are one component of a multi-disciplinary discharge planning process, led by the attending physician.  Recommendations may be updated based on patient status, additional functional criteria and insurance authorization.  Follow up Recommendations No SLP follow up      Assistance Recommended at Discharge    Functional Status Assessment    Frequency and Duration            Prognosis        Swallow Study   General Date of Onset: 06/08/23 HPI: Patient is a 89 year old female admitted to [facility] with shortness of breath and failure to thrive.  Patient has past medical history significant for falls, cervical spondylosis, GERD, anemia, myocarditis, recurrent pericarditis.  Medication list from [facility] includes Tums per patient and she denies significant issues with swallowing. Type of Study: Bedside Swallow Evaluation Previous Swallow Assessment: None in chart Diet Prior to this Study: Regular;Thin liquids (Level 0) Temperature Spikes Noted: N/A Respiratory Status: Nasal cannula History of Recent Intubation: No Behavior/Cognition:  Alert;Cooperative;Confused Oral Cavity Assessment:  (Grits retained in oral cavity mostly left posterior sulcus I) Oral Care Completed by SLP: No Oral Cavity - Dentition: Adequate natural dentition Vision: Functional for self-feeding Self-Feeding Abilities: Needs assist Patient Positioning: Upright in bed Baseline Vocal Quality: Normal Volitional Cough: Weak Volitional Swallow:  (Did not test)    Oral/Motor/Sensory Function Overall Oral Motor/Sensory Function: Within functional limits   Ice Chips Ice chips: Not tested   Thin Liquid Thin Liquid: Within functional limits Presentation: Straw;Self Fed Other Comments: Potential slight discoordination with swallowing and sequential swallows with single bolus which is likely within functional limits for this 89 year old    Nectar Thick Nectar Thick Liquid: Not tested   Honey Thick Honey Thick Liquid: Not tested   Puree Puree: Not tested (Patient declined to consume any applesauce or ice cream)   Solid     Solid: Within functional limits Presentation: Self Or[place] July 06/08/2023,11:13 AM       Rolena Infante, MS [facility] SLP Acute Rehab Services Office [phone]

## 2023-06-08 NOTE — ED Notes (Signed)
 Nurse in room for 35-40 mins to get patient comfortable with family at bedside. Every position patient was moved to she didn't feel comfortable. Nurse in room until patient felt comfort in the position she was laying in.

## 2023-06-08 NOTE — Progress Notes (Signed)
 PROGRESS NOTE    [name]  TFT:[id number] DOB: 1929/07/24 DOA: 06/07/2023 PCP: Emilio Aspen, MD    Brief Narrative:   [name] is a 89 y.o. female with past medical history significant for HTN, anemia of chronic medical disease/iron deficiency, gout, chronic hyponatremia with baseline sodium 130-134, chronic respiratory failure on 2 L nasal cannula at baseline who presented to [facility] ED from [facility]F via EMS for concerns shortness of breath, weakness.  Patient reports decreased appetite, has been taking her furosemide every other day.  Denies fever, no chills, no chest pain, no headache, no abdominal pain, no nausea/vomiting/diarrhea, no urinary symptoms.  In the ED, temperature 97.5 F, HR 66, RR 21, BP 167/72, SpO2 100% on 2 L nasal cannula which is her baseline.  WBC 6.8, hemoglobin 9.2, platelet count of 2 7.  Sodium 118, potassium 4.4, chloride 82, CO2 27, glucose 100, BUN 24, creatinine 0.80.  AST 28, ALT 22, total bilirubin 0.4.  Serum osmolality 262 (low).  Urinalysis unrevealing.  Chest x-ray with stable diffuse chronic lung opacities consistent with chronic interstitial lung disease.  TRH consulted for admission for acute on chronic hyponatremia  Assessment & Plan:   Acute on chronic hyponatremia Patient presenting to ED with progressive weakness, poor appetite.  Baseline sodium between 130-134.  Has been taking furosemide every other day.  Sodium 118 on admission.  Serum osmolality low at 262, urine sodium 40, urine osmolality low at 256.  TSH within normal limits.  Suspect etiology likely secondary to hypovolemic hyponatremia in the setting of diuretic use and poor oral intake -- Na 118>125>123 -- Restart NS at 40 mL/h -- Hold home furosemide -- Check a.m. cortisol level -- Na q6h -- BMP in am  Essential hypertension -- Losartan 25 mg p.o. daily -- Carvedilol 3.125 g p.o. twice daily -- Diltiazem 190 mg p.o. daily -- Holding  home furosemide  Anemia of chronic medical disease/iron deficiency Hemoglobin 9.6, stable -- Ferrous sulfate 325 mg p.o. daily  Gout -- Allopurinol 3 mg p.o. daily  Concern for dysphagia Seen by speech therapy Regular;Thin liquid   Liquid Administration via: Cup;Straw Medication Administration: Other (Comment) (As tolerated) Supervision: Comment (Set up assisted patient able to feed herself) Compensations: Slow rate;Small sips/bites (Check for oral retention) Postural Changes: Seated upright at 90 degrees;Remain upright for at least 30 minutes after po intake   Chronic respiratory failure Maintains adequate oxygenation on her 2 L nasal cannula which is at her baseline.   DVT prophylaxis:     Code Status: Do not attempt resuscitation (DNR) PRE-ARREST INTERVENTIONS DESIRED Family Communication: Updated daughter present bedside this morning  Disposition Plan:  Level of care: Telemetry Status is: Inpatient Remains inpatient appropriate because: IV fluid hydration, needs improvement of sodium level before stable for discharge home    Consultants:  None  Procedures:  None  Antimicrobials:  None   Subjective: Patient seen examined bedside, lying in bed.  Remains in ED holding area.  Daughter present at bedside.  Continues on IV fluids.  Discussed different etiologies for low sodium levels.  Sodium improved to 125 this afternoon.  Daughter concerned about possible dysphagia and consult to speech therapy.  No other specific complaints, questions or concerns at this time.  Denies headache, no fever/chills/night sweats, no nausea/vomiting/diarrhea, no chest pain, no shortness of breath, no abdominal pain.  No acute concerns overnight per nursing staff.  Objective: Vitals:   06/08/23 1230 06/08/23 1300 06/08/23 1446 06/08/23 1447  BP: 136/75 (!) 123/111 (!) 142/70   Pulse: 67 72 71   Resp: 14 17 14    Temp:  97.6 F (36.4 C) 98 F (36.7 C)   TempSrc:   Oral   SpO2: 100% 96%  98%   Weight:    49.9 kg  Height:    5' 4.5" (1.638 m)    Intake/Output Summary (Last 24 hours) at 06/08/2023 1720 Last data filed at 06/08/2023 1015 Gross per 24 hour  Intake 1000.95 ml  Output --  Net 1000.95 ml   Filed Weights   06/08/23 1447  Weight: 49.9 kg    Examination:  Physical Exam: GEN: NAD, alert and oriented x 3, elderly in appearance HEENT: NCAT, PERRL, EOMI, sclera clear, MMM PULM: CTAB w/o wheezes/crackles, normal respiratory effort, on 2 L nasal cannula which is at baseline CV: RRR w/o M/G/R GI: abd soft, NTND, NABS, no R/G/M MSK: + Bilateral symmetric peripheral edema, moves all extremities dependently NEURO: CN II-XII intact, no focal deficits, sensation to light touch intact PSYCH: normal mood/affect Integumentary: dry/intact, no rashes or wounds    Data Reviewed: I have personally reviewed following labs and imaging studies  CBC: Recent Labs  Lab 06/03/23 2110 06/07/23 1340 06/08/23 0644  WBC 6.7 6.8 7.5  NEUTROABS 4.2 4.7  --   HGB 8.7* 9.2* 9.6*  HCT 27.8* 28.6* 30.2*  MCV 82.7 80.8 81.8  PLT 253 270 264   Basic Metabolic Panel: Recent Labs  Lab 06/03/23 2110 06/07/23 1340 06/08/23 0644 06/08/23 1543  NA 124* 118* 125* 123*  K 4.7 4.4 3.9  --   CL 92* 82* 91*  --   CO2 23 27 25   --   GLUCOSE 91 100* 83  --   BUN 34* 24* 21  --   CREATININE 0.98 0.80 0.70  --   CALCIUM 9.2 9.0 9.1  --   MG  --  1.9  --   --    GFR: Estimated Creatinine Clearance: 36.1 mL/min (by C-G formula based on SCr of 0.7 mg/dL). Liver Function Tests: Recent Labs  Lab 06/03/23 2110 06/07/23 1340 06/08/23 0644  AST 35 28 27  ALT 24 22 20   ALKPHOS 98 86 79  BILITOT 0.3 0.4 0.5  PROT 7.5 7.7 7.4  ALBUMIN 3.1* 3.2* 3.2*   No results for input(s): "LIPASE", "AMYLASE" in the last 168 hours. No results for input(s): "AMMONIA" in the last 168 hours. Coagulation Profile: No results for input(s): "INR", "PROTIME" in the last 168 hours. Cardiac  Enzymes: No results for input(s): "CKTOTAL", "CKMB", "CKMBINDEX", "TROPONINI" in the last 168 hours. BNP (last 3 results) No results for input(s): "PROBNP" in the last 8760 hours. HbA1C: No results for input(s): "HGBA1C" in the last 72 hours. CBG: No results for input(s): "GLUCAP" in the last 168 hours. Lipid Profile: No results for input(s): "CHOL", "HDL", "LDLCALC", "TRIG", "CHOLHDL", "LDLDIRECT" in the last 72 hours. Thyroid Function Tests: Recent Labs    06/08/23 1543  TSH 4.097   Anemia Panel: No results for input(s): "VITAMINB12", "FOLATE", "FERRITIN", "TIBC", "IRON", "RETICCTPCT" in the last 72 hours. Sepsis Labs: No results for input(s): "PROCALCITON", "LATICACIDVEN" in the last 168 hours.  No results found for this or any previous visit (from the past 240 hours).       Radiology Studies: DG Chest Portable 1 View Result Date: 06/07/2023 CLINICAL DATA:  Shortness of breath. EXAM: PORTABLE CHEST 1 VIEW COMPARISON:  March 28, 2023. FINDINGS: Stable cardiomediastinal silhouette. Stable diffuse reticular densities  are noted most consistent with chronic interstitial lung disease. No definite acute abnormality is noted. Bony thorax is unremarkable. IMPRESSION: Stable diffuse chronic lung opacities are noted most consistent with chronic interstitial lung disease. Electronically Signed   By: Lupita Raider M.D.   On: 06/07/2023 15:49        Scheduled Meds:  carvedilol  3.125 mg Oral BID WC   diltiazem  180 mg Oral Daily   losartan  25 mg Oral Daily   Continuous Infusions:  sodium chloride       LOS: 1 day    Time spent: 52 minutes spent on 06/08/2023 caring for this patient face-to-face including chart review, ordering labs/tests, documenting, discussion with nursing staff, consultants, updating family and interview/physical exam    [name] [name], DO [facility] Available via Epic secure chat 7am-7pm After these hours, please refer to coverage provider  listed on [email or web address] 06/08/2023, 5:20 PM

## 2023-06-09 DIAGNOSIS — E871 Hypo-osmolality and hyponatremia: Secondary | ICD-10-CM | POA: Diagnosis not present

## 2023-06-09 LAB — BASIC METABOLIC PANEL WITH GFR
Anion gap: 7 (ref 5–15)
BUN: 25 mg/dL — ABNORMAL HIGH (ref 8–23)
CO2: 25 mmol/L (ref 22–32)
Calcium: 8.7 mg/dL — ABNORMAL LOW (ref 8.9–10.3)
Chloride: 93 mmol/L — ABNORMAL LOW (ref 98–111)
Creatinine, Ser: 0.8 mg/dL (ref 0.44–1.00)
GFR, Estimated: >60 mL/min (ref >60)
Glucose, Bld: 75 mg/dL (ref 70–99)
Potassium: 4.6 mmol/L (ref 3.5–5.1)
Sodium: 125 mmol/L — ABNORMAL LOW (ref 135–145)

## 2023-06-09 LAB — SODIUM
Sodium: 126 mmol/L — ABNORMAL LOW (ref 135–145)
Sodium: 126 mmol/L — ABNORMAL LOW (ref 135–145)
Sodium: 127 mmol/L — ABNORMAL LOW (ref 135–145)

## 2023-06-09 LAB — CORTISOL-AM, BLOOD: Cortisol - AM: 5.6 ug/dL — ABNORMAL LOW (ref 6.7–22.6)

## 2023-06-09 MED ORDER — POLYETHYLENE GLYCOL 3350 17 G PO PACK
17.0000 g | PACK | Freq: Every day | ORAL | Status: DC | PRN
  Administered 2023-06-12: 17 g via ORAL
  Filled 2023-06-09: qty 1

## 2023-06-09 MED ORDER — SENNOSIDES-DOCUSATE SODIUM 8.6-50 MG PO TABS
1.0000 | ORAL_TABLET | Freq: Two times a day (BID) | ORAL | Status: DC
  Administered 2023-06-09 – 2023-06-13 (×8): 1 via ORAL
  Filled 2023-06-09 (×8): qty 1

## 2023-06-09 MED ORDER — SODIUM CHLORIDE 0.9 % IV SOLN: INTRAVENOUS | Status: DC

## 2023-06-09 MED ORDER — SODIUM CHLORIDE 1 G PO TABS
1.0000 g | ORAL_TABLET | Freq: Two times a day (BID) | ORAL | Status: DC
  Administered 2023-06-09 (×2): 1 g via ORAL
  Filled 2023-06-09 (×2): qty 1

## 2023-06-09 MED ORDER — COSYNTROPIN 0.25 MG IJ SOLR
0.2500 mg | Freq: Once | INTRAMUSCULAR | Status: AC
  Administered 2023-06-10: 0.25 mg via INTRAVENOUS
  Filled 2023-06-09: qty 0.25

## 2023-06-09 NOTE — Evaluation (Signed)
 Physical Therapy Evaluation Patient Details Name: [name] MRN: [id number] DOB: 1929-03-26 Today's Date: 06/09/2023  History of Present Illness  Pt is a 89 y.o. female presenting to [facility] ED from [facility]F via EMS for concerns shortness of breath, weakness, acute on chronic hyponatremia. 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   Clinical Impression  Pt is a 89 y.o. female with above HPI resulting in the deficits listed below (see PT Problem List). Pt presenting with cognitive deficits and unable to provide clear PLOF. Reports she lives at ALF [facility] per chart review) and that she uses RW at baseline. States that she is able to bathe and dress, but then admits to therapist "well not very well". Pt performed sit to stand transfers with MIN A and cues for safe hand placement. Pt ambulated total of ~15ft with CGA and use of RW. Recommend return to ALF with staff assist, if staff unable to provide current level of assist required then recommend inpatient follow up therapy, <3 hours/day at d/c. Pt will benefit from continued skilled PT to maximize functional mobility to increase independence.          If plan is discharge home, recommend the following: A little help with walking and/or transfers;A little help with bathing/dressing/bathroom;Help with stairs or ramp for entrance;Assistance with cooking/housework;Direct supervision/assist for medications management;Direct supervision/assist for financial management;Assist for transportation   Can travel by private vehicle        Equipment Recommendations None recommended by PT  Recommendations for Other Services       Functional Status Assessment Patient has had a recent decline in their functional status and demonstrates the ability to make significant improvements in function in  a reasonable and predictable amount of time.     Precautions / Restrictions Precautions Precautions: Fall Precaution/Restrictions Comments: Noted a post op shoe along with tennis shoe in pt's room. Pt reports it is for her L foot and she wears it when she is getting up and "out for the day" declined to use during session. Pt reports she still has posterior precautions for hip. (history of dislocations, no formal orders in chart upon eval). Cues for maintaining posterior hip precautions throughout mobility. Restrictions Weight Bearing Restrictions Per Provider Order: No      Mobility  Bed Mobility Overal bed mobility: Modified Independent                  Transfers Overall transfer level: Needs assistance Equipment used: Rolling walker (2 wheels) Transfers: Sit to/from Stand, Bed to chair/wheelchair/BSC Sit to Stand: Min assist   Step pivot transfers: Contact guard assist       General transfer comment: STS x4. Pt reports she still has posterior precautions for hip. (history of dislocations, no formal orders in chart upon eval). Cues for maintaining posterior hip precautions throughout mobility. PT return to assist pt from recliner to [facility] and back to bed as pt vocalizing out for assist and not using call bell, therapist heard from hallway. Pt assisted to [facility] and then back to bed during PT return.    Ambulation/Gait Ambulation/Gait assistance: Contact guard assist Gait Distance (Feet): 15 Feet Assistive device: Rolling walker (2 wheels) Gait Pattern/deviations: Step-through pattern, Decreased stride length, Knee flexed in stance - right, Knee flexed in stance - left, Wide base of support Gait velocity: decreased     General  Gait Details: No overt LOB observed, increased time for turning to sit hips in recliner and to [facility].  Stairs            Wheelchair Mobility     Tilt Bed    Modified Rankin (Stroke Patients Only)       Balance Overall balance assessment:  Needs assistance Sitting-balance support: Feet supported Sitting balance-Leahy Scale: Fair     Standing balance support: Bilateral upper extremity supported, During functional activity Standing balance-Leahy Scale: Fair Standing balance comment: able to stand and pull brief up/down for toileting with CGA for safety.                             Pertinent Vitals/Pain Pain Assessment Pain Assessment: No/denies pain    Home Living Family/patient expects to be discharged to:: Assisted living                 Home Equipment: Agricultural consultant (2 wheels);Rollator (4 wheels);Wheelchair - manual Additional Comments: Per chart, pt from [facility] ALF- pt able to tell therapist that she lives at ALF when given options.    Prior Function Prior Level of Function : Needs assist       Physical Assist : ADLs (physical)     Mobility Comments: RW at baseline, has used w/c at times for longer distances- someone pushes her. ADLs Comments: States "I'm trying to rememebr" when asked about assist with ADLs. Then states " I can wash up and get dressed, well not all that well"     Extremity/Trunk Assessment                Communication   Communication Communication: No apparent difficulties    Cognition Arousal: Alert Behavior During Therapy: WFL for tasks assessed/performed   PT - Cognitive impairments: Orientation   Orientation impairments: Time, Situation                     Following commands: Intact       Cueing       General Comments      Exercises     Assessment/Plan    PT Assessment Patient needs continued PT services  PT Problem List Decreased strength;Decreased range of motion;Decreased activity tolerance;Decreased balance;Decreased mobility;Decreased cognition       PT Treatment Interventions      PT Goals (Current goals can be found in the Care Plan section)  Acute Rehab PT Goals Patient Stated Goal: want to not deal with issues  with hip anymore PT Goal Formulation: With patient Time For Goal Achievement: 06/23/23 Potential to Achieve Goals: Good    Frequency Min 2X/week     Co-evaluation               AM-PAC PT "6 Clicks" Mobility  Outcome Measure Help needed turning from your back to your side while in a flat bed without using bedrails?: None Help needed moving from lying on your back to sitting on the side of a flat bed without using bedrails?: None Help needed moving to and from a bed to a chair (including a wheelchair)?: A Little Help needed standing up from a chair using your arms (e.g., wheelchair or bedside chair)?: A Little Help needed to walk in hospital room?: A Little Help needed climbing 3-5 steps with a railing? : A Lot 6 Click Score: 19    End of Session Equipment Utilized During Treatment: Gait belt Activity Tolerance:  Patient tolerated treatment well Patient left: in chair;with call bell/phone within reach;with chair alarm set Nurse Communication: Mobility status;Other (comment) (pt voiding during session) PT Visit Diagnosis: Unsteadiness on feet (R26.81);Muscle weakness (generalized) (M62.81)    Time: 1610-9604 (PT return from 1440- 1504 to assist pt to [facility] and back to bed. Total time: ) PT Time Calculation (min) (ACUTE ONLY): 22 min   Charges:   PT Evaluation $PT Eval Low Complexity: 1 Low PT Treatments $Therapeutic Activity: 23-37 mins PT General Charges $$ ACUTE PT VISIT: 1 Visit        Lyman Speller PT, DPT  Acute Rehabilitation Services  Office [phone]   06/09/2023, 3:32 PM

## 2023-06-09 NOTE — Progress Notes (Signed)
 PROGRESS NOTE    [name]  XNA:[id number] DOB: 29-Sep-1929 DOA: 06/07/2023 PCP: Emilio Aspen, MD    Brief Narrative:   [name] is a 89 y.o. female with past medical history significant for HTN, anemia of chronic medical disease/iron deficiency, gout, chronic hyponatremia with baseline sodium 130-134, chronic respiratory failure on 2 L nasal cannula at baseline who presented to [facility] ED from [facility]F via EMS for concerns shortness of breath, weakness.  Patient reports decreased appetite, has been taking her furosemide every other day.  Denies fever, no chills, no chest pain, no headache, no abdominal pain, no nausea/vomiting/diarrhea, no urinary symptoms.  In the ED, temperature 97.5 F, HR 66, RR 21, BP 167/72, SpO2 100% on 2 L nasal cannula which is her baseline.  WBC 6.8, hemoglobin 9.2, platelet count of 2 7.  Sodium 118, potassium 4.4, chloride 82, CO2 27, glucose 100, BUN 24, creatinine 0.80.  AST 28, ALT 22, total bilirubin 0.4.  Serum osmolality 262 (low).  Urinalysis unrevealing.  Chest x-ray with stable diffuse chronic lung opacities consistent with chronic interstitial lung disease.  TRH consulted for admission for acute on chronic hyponatremia  Assessment & Plan:   Acute on chronic hyponatremia Patient presenting to ED with progressive weakness, poor appetite.  Baseline sodium between 130-134.  Has been taking furosemide every other day.  Sodium 118 on admission.  Serum osmolality low at 262, urine sodium 40, urine osmolality low at 256.  TSH within normal limits.  Suspect etiology likely secondary to hypovolemic hyponatremia in the setting of diuretic use and poor oral intake -- Na 118>125>123>125>126 -- NS at 40 mL/h -- NaCl tab 1g PO BID -- Hold home furosemide -- AM cortisol level low at 5.6, plan ACTH stim test tomorrow morning -- Na q6h -- BMP in am  Essential hypertension -- Losartan 25 mg p.o. daily -- Carvedilol 3.125  g p.o. twice daily -- Diltiazem 190 mg p.o. daily -- Holding home furosemide  Anemia of chronic medical disease/iron deficiency Hemoglobin 9.6, stable -- Ferrous sulfate 325 mg p.o. daily  Gout -- Allopurinol 3 mg p.o. daily  Concern for dysphagia Seen by speech therapy Regular;Thin liquid   Liquid Administration via: Cup;Straw Medication Administration: Other (Comment) (As tolerated) Supervision: Comment (Set up assisted patient able to feed herself) Compensations: Slow rate;Small sips/bites (Check for oral retention) Postural Changes: Seated upright at 90 degrees;Remain upright for at least 30 minutes after po intake   Chronic respiratory failure Maintains adequate oxygenation on her 2 L nasal cannula which is at her baseline.  Weakness/debility/deconditioning: Currently resides at [facility]F -- PT evaluation   DVT prophylaxis:     Code Status: Do not attempt resuscitation (DNR) PRE-ARREST INTERVENTIONS DESIRED Family Communication: No family present at bedside this morning  Disposition Plan:  Level of care: Telemetry Status is: Inpatient Remains inpatient appropriate because: IV fluid hydration, needs improvement of sodium level before stable for discharge back to ALF    Consultants:  None  Procedures:  None  Antimicrobials:  None   Subjective: Patient seen examined bedside, lying in bed.  Remains in ED holding area.  Daughter present at bedside.  Continues on IV fluids.  Discussed different etiologies for low sodium levels.  Sodium improved to 125 this afternoon.  Daughter concerned about possible dysphagia and consult to speech therapy.  No other specific complaints, questions or concerns at this time.  Denies headache, no fever/chills/night sweats, no nausea/vomiting/diarrhea, no chest pain, no  shortness of breath, no abdominal pain.  No acute concerns overnight per nursing staff.  Objective: Vitals:   06/08/23 2116 06/08/23 2135 06/09/23 0636  06/09/23 0909  BP: (!) 170/78 (!) 159/85 (!) 179/78 135/74  Pulse: 67 68 75 94  Resp: 15  14 20   Temp: (!) 97.5 F (36.4 C)  97.7 F (36.5 C)   TempSrc: Oral  Oral   SpO2: 100%  100% 100%  Weight:      Height:        Intake/Output Summary (Last 24 hours) at 06/09/2023 1101 Last data filed at 06/09/2023 0913 Gross per 24 hour  Intake 876.38 ml  Output 200 ml  Net 676.38 ml   Filed Weights   06/08/23 1447  Weight: 49.9 kg    Examination:  Physical Exam: GEN: NAD, alert and oriented x 3, elderly in appearance HEENT: NCAT, PERRL, EOMI, sclera clear, MMM PULM: CTAB w/o wheezes/crackles, normal respiratory effort, on 2 L nasal cannula which is at baseline CV: RRR w/o M/G/R GI: abd soft, NTND, NABS, no R/G/M MSK: + Bilateral symmetric peripheral edema, moves all extremities dependently NEURO: CN II-XII intact, no focal deficits, sensation to light touch intact PSYCH: normal mood/affect Integumentary: dry/intact, no rashes or wounds    Data Reviewed: I have personally reviewed following labs and imaging studies  CBC: Recent Labs  Lab 06/03/23 2110 06/07/23 1340 06/08/23 0644  WBC 6.7 6.8 7.5  NEUTROABS 4.2 4.7  --   HGB 8.7* 9.2* 9.6*  HCT 27.8* 28.6* 30.2*  MCV 82.7 80.8 81.8  PLT 253 270 264   Basic Metabolic Panel: Recent Labs  Lab 06/03/23 2110 06/07/23 1340 06/08/23 0644 06/08/23 1543 06/08/23 1817 06/08/23 2345 06/09/23 0831  NA 124* 118* 125* 123* 122* 125* 126*  K 4.7 4.4 3.9  --   --  4.6  --   CL 92* 82* 91*  --   --  93*  --   CO2 23 27 25   --   --  25  --   GLUCOSE 91 100* 83  --   --  75  --   BUN 34* 24* 21  --   --  25*  --   CREATININE 0.98 0.80 0.70  --   --  0.80  --   CALCIUM 9.2 9.0 9.1  --   --  8.7*  --   MG  --  1.9  --   --   --   --   --    GFR: Estimated Creatinine Clearance: 36.1 mL/min (by C-G formula based on SCr of 0.8 mg/dL). Liver Function Tests: Recent Labs  Lab 06/03/23 2110 06/07/23 1340 06/08/23 0644  AST 35  28 27  ALT 24 22 20   ALKPHOS 98 86 79  BILITOT 0.3 0.4 0.5  PROT 7.5 7.7 7.4  ALBUMIN 3.1* 3.2* 3.2*   No results for input(s): "LIPASE", "AMYLASE" in the last 168 hours. No results for input(s): "AMMONIA" in the last 168 hours. Coagulation Profile: No results for input(s): "INR", "PROTIME" in the last 168 hours. Cardiac Enzymes: No results for input(s): "CKTOTAL", "CKMB", "CKMBINDEX", "TROPONINI" in the last 168 hours. BNP (last 3 results) No results for input(s): "PROBNP" in the last 8760 hours. HbA1C: No results for input(s): "HGBA1C" in the last 72 hours. CBG: No results for input(s): "GLUCAP" in the last 168 hours. Lipid Profile: No results for input(s): "CHOL", "HDL", "LDLCALC", "TRIG", "CHOLHDL", "LDLDIRECT" in the last 72 hours. Thyroid Function Tests: Recent Labs  06/08/23 1543  TSH 4.097   Anemia Panel: No results for input(s): "VITAMINB12", "FOLATE", "FERRITIN", "TIBC", "IRON", "RETICCTPCT" in the last 72 hours. Sepsis Labs: No results for input(s): "PROCALCITON", "LATICACIDVEN" in the last 168 hours.  No results found for this or any previous visit (from the past 240 hours).       Radiology Studies: DG Chest Portable 1 View Result Date: 06/07/2023 CLINICAL DATA:  Shortness of breath. EXAM: PORTABLE CHEST 1 VIEW COMPARISON:  March 28, 2023. FINDINGS: Stable cardiomediastinal silhouette. Stable diffuse reticular densities are noted most consistent with chronic interstitial lung disease. No definite acute abnormality is noted. Bony thorax is unremarkable. IMPRESSION: Stable diffuse chronic lung opacities are noted most consistent with chronic interstitial lung disease. Electronically Signed   By: Lupita Raider M.D.   On: 06/07/2023 15:49        Scheduled Meds:  allopurinol  300 mg Oral Daily   carvedilol  3.125 mg Oral BID WC   [START ON 06/10/2023] cosyntropin  0.25 mg Intravenous Once   diltiazem  180 mg Oral Daily   ferrous sulfate  325 mg Oral Q  breakfast   losartan  25 mg Oral Daily   melatonin  3 mg Oral QHS   sodium chloride  1 g Oral BID WC   Continuous Infusions:  sodium chloride 40 mL/hr at 06/08/23 1800     LOS: 2 days    Time spent: 48 minutes spent on 06/09/2023 caring for this patient face-to-face including chart review, ordering labs/tests, documenting, discussion with nursing staff, consultants, updating family and interview/physical exam    [name] [name], DO [facility] Available via Epic secure chat 7am-7pm After these hours, please refer to coverage provider listed on [email or web address] 06/09/2023, 11:01 AM

## 2023-06-10 DIAGNOSIS — E871 Hypo-osmolality and hyponatremia: Secondary | ICD-10-CM | POA: Diagnosis not present

## 2023-06-10 LAB — BASIC METABOLIC PANEL WITH GFR
Anion gap: 7 (ref 5–15)
BUN: 27 mg/dL — ABNORMAL HIGH (ref 8–23)
CO2: 22 mmol/L (ref 22–32)
Calcium: 8.9 mg/dL (ref 8.9–10.3)
Chloride: 98 mmol/L (ref 98–111)
Creatinine, Ser: 0.8 mg/dL (ref 0.44–1.00)
GFR, Estimated: >60 mL/min (ref >60)
Glucose, Bld: 92 mg/dL (ref 70–99)
Potassium: 4.1 mmol/L (ref 3.5–5.1)
Sodium: 127 mmol/L — ABNORMAL LOW (ref 135–145)

## 2023-06-10 MED ORDER — SODIUM CHLORIDE 1 G PO TABS
2.0000 g | ORAL_TABLET | Freq: Two times a day (BID) | ORAL | Status: DC
  Administered 2023-06-10 – 2023-06-13 (×7): 2 g via ORAL
  Filled 2023-06-10 (×7): qty 2

## 2023-06-10 MED ORDER — SODIUM CHLORIDE 0.9 % IV SOLN: INTRAVENOUS | Status: DC

## 2023-06-10 MED ORDER — MIRTAZAPINE 15 MG PO TABS
15.0000 mg | ORAL_TABLET | Freq: Every day | ORAL | Status: DC
  Administered 2023-06-10 – 2023-06-12 (×3): 15 mg via ORAL
  Filled 2023-06-10 (×3): qty 1

## 2023-06-10 MED ORDER — COSYNTROPIN 0.25 MG IJ SOLR
0.2500 mg | Freq: Once | INTRAMUSCULAR | Status: AC
  Administered 2023-06-11: 0.25 mg via INTRAVENOUS
  Filled 2023-06-10: qty 0.25

## 2023-06-10 MED ORDER — DOXYCYCLINE HYCLATE 100 MG PO TABS
100.0000 mg | ORAL_TABLET | Freq: Two times a day (BID) | ORAL | Status: DC
  Administered 2023-06-10 – 2023-06-13 (×7): 100 mg via ORAL
  Filled 2023-06-10 (×7): qty 1

## 2023-06-10 MED ORDER — COLCHICINE 0.6 MG PO TABS
0.6000 mg | ORAL_TABLET | ORAL | Status: DC
Start: 2023-06-10 — End: 2023-06-13
  Administered 2023-06-10 – 2023-06-12 (×2): 0.6 mg via ORAL
  Filled 2023-06-10 (×2): qty 1

## 2023-06-10 NOTE — Progress Notes (Signed)
 PROGRESS NOTE    [name]  ZOX:[id number] DOB: 06/18/1929 DOA: 06/07/2023 PCP: Emilio Aspen, MD    Brief Narrative:   [name] is a 89 y.o. female with past medical history significant for HTN, anemia of chronic medical disease/iron deficiency, gout, chronic hyponatremia with baseline sodium 130-134, chronic respiratory failure on 2 L nasal cannula at baseline who presented to [facility] ED from [facility]F via EMS for concerns shortness of breath, weakness.  Patient reports decreased appetite, has been taking her furosemide every other day.  Denies fever, no chills, no chest pain, no headache, no abdominal pain, no nausea/vomiting/diarrhea, no urinary symptoms.  In the ED, temperature 97.5 F, HR 66, RR 21, BP 167/72, SpO2 100% on 2 L nasal cannula which is her baseline.  WBC 6.8, hemoglobin 9.2, platelet count of 2 7.  Sodium 118, potassium 4.4, chloride 82, CO2 27, glucose 100, BUN 24, creatinine 0.80.  AST 28, ALT 22, total bilirubin 0.4.  Serum osmolality 262 (low).  Urinalysis unrevealing.  Chest x-ray with stable diffuse chronic lung opacities consistent with chronic interstitial lung disease.  TRH consulted for admission for acute on chronic hyponatremia  Assessment & Plan:   Acute on chronic hyponatremia Patient presenting to ED with progressive weakness, poor appetite.  Baseline sodium between 130-134.  Has been taking furosemide every other day.  Sodium 118 on admission.  Serum osmolality low at 262, urine sodium 40, urine osmolality low at 256.  TSH within normal limits.  Suspect etiology likely secondary to hypovolemic hyponatremia in the setting of diuretic use and poor oral intake -- Na 118>125>123>125>126>127>labs pending this am -- NS at 75 mL/h -- NaCl tab 2g PO BID -- Hold home furosemide -- AM cortisol level low at 5.6, plan ACTH stim test pending today -- BMP in am  Essential hypertension -- Losartan 25 mg p.o. daily --  Carvedilol 3.125 g p.o. twice daily -- Diltiazem 180 mg p.o. daily -- Holding home furosemide  Anemia of chronic medical disease/iron deficiency Hemoglobin 9.6, stable -- Ferrous sulfate 325 mg p.o. daily  Gout -- Allopurinol 3 mg p.o. daily  Concern for dysphagia Seen by speech therapy Regular;Thin liquid   Liquid Administration via: Cup;Straw Medication Administration: Other (Comment) (As tolerated) Supervision: Comment (Set up assisted patient able to feed herself) Compensations: Slow rate;Small sips/bites (Check for oral retention) Postural Changes: Seated upright at 90 degrees;Remain upright for at least 30 minutes after po intake   Chronic respiratory failure Maintains adequate oxygenation on her 2 L nasal cannula which is at her baseline.  Weakness/debility/deconditioning: Currently resides at [facility] ALF -- PT evaluation>no needs identified   DVT prophylaxis:     Code Status: Do not attempt resuscitation (DNR) PRE-ARREST INTERVENTIONS DESIRED Family Communication: No family present at bedside this morning  Disposition Plan:  Level of care: Telemetry Status is: Inpatient Remains inpatient appropriate because: IV fluid hydration, needs improvement of sodium level before stable for discharge back to ALF    Consultants:  None  Procedures:  None  Antimicrobials:  None   Subjective: Patient seen examined bedside, lying in bed.  Sleeping but easily arousable.  No family present at bedside.  Continues on IV fluids.  Labs pending this morning including ACTH stim test. No other specific complaints, questions or concerns at this time.  Denies headache, no fever/chills/night sweats, no nausea/vomiting/diarrhea, no chest pain, no shortness of breath, no abdominal pain.  No acute concerns overnight per nursing staff.  Objective:  Vitals:   06/09/23 0909 06/09/23 1254 06/09/23 2117 06/10/23 0256  BP: 135/74 121/62 (!) 156/79 (!) 155/84  Pulse: 94 71 72 75  Resp:  20  15 14   Temp:  98.2 F (36.8 C) 98 F (36.7 C) 98.2 F (36.8 C)  TempSrc:  Oral Oral Oral  SpO2: 100% 100% 99% 100%  Weight:      Height:        Intake/Output Summary (Last 24 hours) at 06/10/2023 0955 Last data filed at 06/10/2023 0653 Gross per 24 hour  Intake 690.67 ml  Output --  Net 690.67 ml   Filed Weights   06/08/23 1447  Weight: 49.9 kg    Examination:  Physical Exam: GEN: NAD, alert and oriented x 3, elderly in appearance HEENT: NCAT, PERRL, EOMI, sclera clear, MMM PULM: CTAB w/o wheezes/crackles, normal respiratory effort, on 2 L nasal cannula which is at baseline CV: RRR w/o M/G/R GI: abd soft, NTND, NABS, no R/G/M MSK: + Bilateral symmetric peripheral edema, moves all extremities dependently NEURO: CN II-XII intact, no focal deficits, sensation to light touch intact PSYCH: normal mood/affect Integumentary: dry/intact, no rashes or wounds    Data Reviewed: I have personally reviewed following labs and imaging studies  CBC: Recent Labs  Lab 06/03/23 2110 06/07/23 1340 06/08/23 0644  WBC 6.7 6.8 7.5  NEUTROABS 4.2 4.7  --   HGB 8.7* 9.2* 9.6*  HCT 27.8* 28.6* 30.2*  MCV 82.7 80.8 81.8  PLT 253 270 264   Basic Metabolic Panel: Recent Labs  Lab 06/03/23 2110 06/07/23 1340 06/08/23 0644 06/08/23 1543 06/08/23 1817 06/08/23 2345 06/09/23 0831 06/09/23 1304 06/09/23 1848  NA 124* 118* 125*   < > 122* 125* 126* 126* 127*  K 4.7 4.4 3.9  --   --  4.6  --   --   --   CL 92* 82* 91*  --   --  93*  --   --   --   CO2 23 27 25   --   --  25  --   --   --   GLUCOSE 91 100* 83  --   --  75  --   --   --   BUN 34* 24* 21  --   --  25*  --   --   --   CREATININE 0.98 0.80 0.70  --   --  0.80  --   --   --   CALCIUM 9.2 9.0 9.1  --   --  8.7*  --   --   --   MG  --  1.9  --   --   --   --   --   --   --    < > = values in this interval not displayed.   GFR: Estimated Creatinine Clearance: 36.1 mL/min (by C-G formula based on SCr of 0.8  mg/dL). Liver Function Tests: Recent Labs  Lab 06/03/23 2110 06/07/23 1340 06/08/23 0644  AST 35 28 27  ALT 24 22 20   ALKPHOS 98 86 79  BILITOT 0.3 0.4 0.5  PROT 7.5 7.7 7.4  ALBUMIN 3.1* 3.2* 3.2*   No results for input(s): "LIPASE", "AMYLASE" in the last 168 hours. No results for input(s): "AMMONIA" in the last 168 hours. Coagulation Profile: No results for input(s): "INR", "PROTIME" in the last 168 hours. Cardiac Enzymes: No results for input(s): "CKTOTAL", "CKMB", "CKMBINDEX", "TROPONINI" in the last 168 hours. BNP (last 3 results) No results for  input(s): "PROBNP" in the last 8760 hours. HbA1C: No results for input(s): "HGBA1C" in the last 72 hours. CBG: No results for input(s): "GLUCAP" in the last 168 hours. Lipid Profile: No results for input(s): "CHOL", "HDL", "LDLCALC", "TRIG", "CHOLHDL", "LDLDIRECT" in the last 72 hours. Thyroid Function Tests: Recent Labs    06/08/23 1543  TSH 4.097   Anemia Panel: No results for input(s): "VITAMINB12", "FOLATE", "FERRITIN", "TIBC", "IRON", "RETICCTPCT" in the last 72 hours. Sepsis Labs: No results for input(s): "PROCALCITON", "LATICACIDVEN" in the last 168 hours.  No results found for this or any previous visit (from the past 240 hours).       Radiology Studies: No results found.       Scheduled Meds:  allopurinol  300 mg Oral Daily   carvedilol  3.125 mg Oral BID WC   diltiazem  180 mg Oral Daily   ferrous sulfate  325 mg Oral Q breakfast   losartan  25 mg Oral Daily   melatonin  3 mg Oral QHS   senna-docusate  1 tablet Oral BID   sodium chloride  2 g Oral BID WC   Continuous Infusions:  sodium chloride 75 mL/hr (06/10/23 0653)     LOS: 3 days    Time spent: 48 minutes spent on 06/10/2023 caring for this patient face-to-face including chart review, ordering labs/tests, documenting, discussion with nursing staff, consultants, updating family and interview/physical exam    [name] [name], DO [facility] Available via Epic secure chat 7am-7pm After these hours, please refer to coverage provider listed on [email or web address] 06/10/2023, 9:55 AM

## 2023-06-10 NOTE — Progress Notes (Signed)
   06/10/23 1340  PT Visit Information  Last PT Received On 06/10/23  Reason Eval/Treat Not Completed Patient declined, no reason specified (Pt closing eyes and stating " I am sleeping" when PT entered room and educated pt on PT role and goal of session.)   PT will follow up as schedule allows.

## 2023-06-10 NOTE — TOC Initial Note (Signed)
 Transition of Care [facility]) - Initial/Assessment Note    Patient Details  Name: [name] MRN: [id number] Date of Birth: [birth year]  [facility] CM/SW Contact:    Larrie Kass, LCSW Phone Number: 06/10/2023, 3:51 PM  Clinical Narrative:                 CSW met with Pt at bedside. Pt reported that she resides at [facility] and stated that her plan is to return there. She mentioned that she does not know how she will get back and gave permission to call her son to discuss the matter. Pt also reported that she is on oxygen at the facility.  CSW attempted to call the Pt's son but received no answer.  CSW attempted to call [facility] and was transferred to a VM, left message never received call back.    CSW then contacted [facility] and obtained a contact number [phone]) for the CNAs in the [facility] building, where the pt resides. Receptionist stated to call this number if the pt is discharged over the weekend. TOC to follow  Expected Discharge Plan: Assisted Living Barriers to Discharge: Continued Medical Work up   Patient Goals and CMS Choice Patient states their goals for this hospitalization and ongoing recovery are:: return to facility          Expected Discharge Plan and Services       Living arrangements for the past 2 months: Assisted Living Facility                                      Prior Living Arrangements/Services Living arrangements for the past 2 months: Assisted Living Facility Lives with:: Self Patient language and need for interpreter reviewed:: Yes Do you feel safe going back to the place where you live?: Yes      Need for Family Participation in Patient Care: Yes (Comment)   Current home services: DME    Activities of Daily Living   ADL Screening (condition at time of admission) Independently performs ADLs?: Yes (appropriate for developmental age) Is the patient deaf or have  difficulty hearing?: No Does the patient have difficulty seeing, even when wearing glasses/contacts?: No Does the patient have difficulty concentrating, remembering, or making decisions?: No  Permission Sought/Granted                  Emotional Assessment Appearance:: Appears stated age Attitude/Demeanor/Rapport: Gracious Affect (typically observed): Accepting Orientation: : Oriented to Self, Oriented to Place, Oriented to  Time, Oriented to Situation   Psych Involvement: No (comment)  Admission diagnosis:  Shortness of breath [R06.02] Hyponatremia [E87.1] Weakness [R53.1] Patient Active Problem List   Diagnosis Date Noted   Hyponatremia 06/07/2023   Failure to thrive in adult 06/07/2023   Closed anterior dislocation of left hip (HCC) 04/12/2023   Closed dislocation of left hip, subsequent encounter 04/12/2023   Ulcer of foot, unspecified laterality, limited to breakdown of skin (HCC) 03/23/2023   Osteoarthritis of right glenohumeral joint 02/25/2023   Chronic diastolic CHF (congestive heart failure) (HCC) 12/09/2022   Acute encephalopathy 12/07/2022   Dehydration 12/07/2022   Acute prerenal azotemia 12/07/2022   Leukocytosis 12/07/2022   Chronic hyponatremia 12/07/2022   ILD (interstitial lung disease) (HCC) 10/20/2022   CAP (community acquired pneumonia) 06/12/2022   Chronic heart failure with preserved ejection fraction (HFpEF) (HCC) 04/07/2022   Advanced care planning/counseling discussion  04/07/2022   Chronic venous stasis dermatitis of both lower extremities 04/07/2022   Malaise and fatigue 07/15/2021   Pain of left hip joint 05/20/2021   Primary osteoarthritis 04/20/2021   Inflammatory polyarthropathy (HCC) 04/20/2021   Hyperuricemia 04/20/2021   Generalized weakness 04/20/2021   Chronic kidney disease, stage 3 unspecified (HCC) 04/20/2021   Adjustment disorder with mixed disturbance of emotions and conduct 01/20/2021   Severe recurrent major depression  without psychotic features (HCC) 01/19/2021   Pincer nail deformity 12/01/2020   Iron deficiency anemia 03/24/2020   Clavi 02/19/2020   Lumbar radiculopathy 06/17/2019   Pain due to onychomycosis of toenails of both feet 05/14/2019   Mixed incontinence 03/10/2019   Cervical spondylosis 06/07/2018   DDD (degenerative disc disease), cervical 05/20/2018   History of total knee replacement, left 04/13/2018   History of total knee replacement, right 04/13/2018   History of revision of total replacement of left hip joint 04/13/2018   Presence of left artificial knee joint 04/13/2018   Gait abnormality 09/08/2017   Chronic cystitis 07/05/2017   Dysuria 07/05/2017   Anemia of chronic disease 09/22/2015   h/o pericarditis    Essential hypertension    Aortic insufficiency    Posterior vitreous detachment, bilateral 06/05/2015   Left wrist pain 05/16/2015   Triquetral chip fracture, left, closed, initial encounter 05/16/2015   Primary osteoarthritis of first carpometacarpal joint of right hand 02/11/2015   Primary osteoarthritis of right wrist 02/11/2015   Arthropathy 11/25/2014   Multiple skin tears 11/25/2014   Hip dislocation, left (HCC) 12/05/2013   Trochanteric bursitis of left hip 11/24/2012   Aneurysm of thoracic aorta (HCC)    Nonexudative age-related macular degeneration 04/22/2011   Posterior capsular opacification, right 04/22/2011   Status post intraocular lens implant 04/22/2011   PCP:  Emilio Aspen, MD Pharmacy:   [facility] - [place], [place] - [address] [place] [address] Phone: [phone] Fax: [phone]     Social Drivers of Health (SDOH) Social History: SDOH Screenings   Food Insecurity: No Food Insecurity (06/08/2023)  Housing: Low Risk  (06/08/2023)  Transportation Needs: No Transportation Needs (06/08/2023)  Utilities: Not At Risk (06/08/2023)  Alcohol Screen: Low Risk  (01/19/2021)   Depression (PHQ2-9): Low Risk  (06/10/2021)  Social Connections: Unknown (06/08/2023)  Tobacco Use: Medium Risk (06/08/2023)   SDOH Interventions:     Readmission Risk Interventions    04/15/2023   12:50 PM  Readmission Risk Prevention Plan  Transportation Screening Complete  PCP or Specialist Appt within 3-5 Days Complete  HRI or Home Care Consult Complete  Social Work Consult for Recovery Care Planning/Counseling Complete  Palliative Care Screening Not Applicable  Medication Review Oceanographer) Complete

## 2023-06-11 DIAGNOSIS — E871 Hypo-osmolality and hyponatremia: Secondary | ICD-10-CM | POA: Diagnosis not present

## 2023-06-11 LAB — BASIC METABOLIC PANEL WITH GFR
Anion gap: 7 (ref 5–15)
BUN: 18 mg/dL (ref 8–23)
CO2: 22 mmol/L (ref 22–32)
Calcium: 9 mg/dL (ref 8.9–10.3)
Chloride: 101 mmol/L (ref 98–111)
Creatinine, Ser: 0.58 mg/dL (ref 0.44–1.00)
GFR, Estimated: >60 mL/min (ref >60)
Glucose, Bld: 97 mg/dL (ref 70–99)
Potassium: 4.4 mmol/L (ref 3.5–5.1)
Sodium: 130 mmol/L — ABNORMAL LOW (ref 135–145)

## 2023-06-11 LAB — ACTH STIMULATION, 3 TIME POINTS
Cortisol, 30 Min: 25.3 ug/dL
Cortisol, 60 Min: 25.7 ug/dL
Cortisol, Base: 15.6 ug/dL

## 2023-06-11 MED ORDER — HYDRALAZINE HCL 10 MG PO TABS
10.0000 mg | ORAL_TABLET | Freq: Four times a day (QID) | ORAL | Status: DC | PRN
  Administered 2023-06-11 – 2023-06-12 (×3): 10 mg via ORAL
  Filled 2023-06-11 (×3): qty 1

## 2023-06-11 NOTE — Plan of Care (Signed)
 Patient states she feels better this evening and is able to teach back the goals of this admission and plan of care

## 2023-06-11 NOTE — Progress Notes (Signed)
 PROGRESS NOTE    CECEILIA CEPHUS  ZOX:[id number] DOB: [birth year] DOA: 06/07/2023 PCP: Emilio Aspen, MD    Brief Narrative:   [name] is a 89 y.o. female with past medical history significant for HTN, anemia of chronic medical disease/iron deficiency, gout, chronic hyponatremia with baseline sodium 130-134, chronic respiratory failure on 2 L nasal cannula at baseline who presented to [facility] ED from [facility]F via EMS for concerns shortness of breath, weakness.  Patient reports decreased appetite, has been taking her furosemide every other day.  Denies fever, no chills, no chest pain, no headache, no abdominal pain, no nausea/vomiting/diarrhea, no urinary symptoms.  In the ED, temperature 97.5 F, HR 66, RR 21, BP 167/72, SpO2 100% on 2 L nasal cannula which is her baseline.  WBC 6.8, hemoglobin 9.2, platelet count of 2 7.  Sodium 118, potassium 4.4, chloride 82, CO2 27, glucose 100, BUN 24, creatinine 0.80.  AST 28, ALT 22, total bilirubin 0.4.  Serum osmolality 262 (low).  Urinalysis unrevealing.  Chest x-ray with stable diffuse chronic lung opacities consistent with chronic interstitial lung disease.  TRH consulted for admission for acute on chronic hyponatremia  Assessment & Plan:   Acute on chronic hyponatremia Patient presenting to ED with progressive weakness, poor appetite.  Baseline sodium between 130-134.  Has been taking furosemide every other day.  Sodium 118 on admission.  Serum osmolality low at 262, urine sodium 40, urine osmolality low at 256.  TSH within normal limits.  Suspect etiology likely secondary to hypovolemic hyponatremia in the setting of diuretic use and poor oral intake -- Na 118>125>123>125>126>127>130 -- Discontinue IV fluids today -- NaCl tab 2g PO BID -- Hold home furosemide -- AM cortisol level low at 5.6, plan ACTH stim test pending today -- BMP in am  Essential hypertension -- Losartan 25 mg p.o. daily --  Carvedilol 3.125 g p.o. twice daily -- Diltiazem 180 mg p.o. daily -- Holding home furosemide  Anemia of chronic medical disease/iron deficiency Hemoglobin 9.6, stable -- Ferrous sulfate 325 mg p.o. daily  Gout -- Allopurinol 3 mg p.o. daily  Concern for dysphagia Seen by speech therapy Regular;Thin liquid   Liquid Administration via: Cup;Straw Medication Administration: Other (Comment) (As tolerated) Supervision: Comment (Set up assisted patient able to feed herself) Compensations: Slow rate;Small sips/bites (Check for oral retention) Postural Changes: Seated upright at 90 degrees;Remain upright for at least 30 minutes after po intake   Chronic respiratory failure Maintains adequate oxygenation on her 2 L nasal cannula which is at her baseline.  Weakness/debility/deconditioning: Currently resides at [facility]F -- PT evaluation>no needs identified   DVT prophylaxis:     Code Status: Do not attempt resuscitation (DNR) PRE-ARREST INTERVENTIONS DESIRED Family Communication: No family present at bedside this morning  Disposition Plan:  Level of care: Telemetry Status is: Inpatient Remains inpatient appropriate because: needs improvement of sodium level before stable for discharge back to ALF; anticipate Monday    Consultants:  None  Procedures:  None  Antimicrobials:  None   Subjective: Patient seen examined bedside, lying in bed.  Sleeping but easily arousable.  No family present at bedside.  Sodium up to 130 today, pending ACTH stim test. No other specific complaints, questions or concerns at this time.  Denies headache, no fever/chills/night sweats, no nausea/vomiting/diarrhea, no chest pain, no shortness of breath, no abdominal pain.  Discussed with RN this morning, no acute concerns overnight per nursing staff.  Objective: Vitals:  06/10/23 2155 06/11/23 0000 06/11/23 0400 06/11/23 0645  BP: (!) 156/79   (!) 208/75  Pulse:    64  Resp:  20  17  Temp:       TempSrc:      SpO2:  99% 100% 100%  Weight:      Height:        Intake/Output Summary (Last 24 hours) at 06/11/2023 1023 Last data filed at 06/11/2023 0825 Gross per 24 hour  Intake 1445 ml  Output 2050 ml  Net -605 ml   Filed Weights   06/08/23 1447  Weight: 49.9 kg    Examination:  Physical Exam: GEN: NAD, alert and oriented x 3, elderly in appearance HEENT: NCAT, PERRL, EOMI, sclera clear, MMM PULM: CTAB w/o wheezes/crackles, normal respiratory effort, on 2 L nasal cannula which is at baseline CV: RRR w/o M/G/R GI: abd soft, NTND, NABS, no R/G/M MSK: + Bilateral symmetric peripheral edema, moves all extremities dependently NEURO: CN II-XII intact, no focal deficits, sensation to light touch intact PSYCH: normal mood/affect Integumentary: dry/intact, no rashes or wounds    Data Reviewed: I have personally reviewed following labs and imaging studies  CBC: Recent Labs  Lab 06/07/23 1340 06/08/23 0644  WBC 6.8 7.5  NEUTROABS 4.7  --   HGB 9.2* 9.6*  HCT 28.6* 30.2*  MCV 80.8 81.8  PLT 270 264   Basic Metabolic Panel: Recent Labs  Lab 06/07/23 1340 06/08/23 0644 06/08/23 1543 06/08/23 2345 06/09/23 0831 06/09/23 1304 06/09/23 1848 06/10/23 0859 06/11/23 0847  NA 118* 125*   < > 125* 126* 126* 127* 127* 130*  K 4.4 3.9  --  4.6  --   --   --  4.1 4.4  CL 82* 91*  --  93*  --   --   --  98 101  CO2 27 25  --  25  --   --   --  22 22  GLUCOSE 100* 83  --  75  --   --   --  92 97  BUN 24* 21  --  25*  --   --   --  27* 18  CREATININE 0.80 0.70  --  0.80  --   --   --  0.80 0.58  CALCIUM 9.0 9.1  --  8.7*  --   --   --  8.9 9.0  MG 1.9  --   --   --   --   --   --   --   --    < > = values in this interval not displayed.   GFR: Estimated Creatinine Clearance: 36.1 mL/min (by C-G formula based on SCr of 0.58 mg/dL). Liver Function Tests: Recent Labs  Lab 06/07/23 1340 06/08/23 0644  AST 28 27  ALT 22 20  ALKPHOS 86 79  BILITOT 0.4 0.5  PROT  7.7 7.4  ALBUMIN 3.2* 3.2*   No results for input(s): "LIPASE", "AMYLASE" in the last 168 hours. No results for input(s): "AMMONIA" in the last 168 hours. Coagulation Profile: No results for input(s): "INR", "PROTIME" in the last 168 hours. Cardiac Enzymes: No results for input(s): "CKTOTAL", "CKMB", "CKMBINDEX", "TROPONINI" in the last 168 hours. BNP (last 3 results) No results for input(s): "PROBNP" in the last 8760 hours. HbA1C: No results for input(s): "HGBA1C" in the last 72 hours. CBG: No results for input(s): "GLUCAP" in the last 168 hours. Lipid Profile: No results for input(s): "CHOL", "HDL", "LDLCALC", "TRIG", "CHOLHDL", "LDLDIRECT"  in the last 72 hours. Thyroid Function Tests: Recent Labs    06/08/23 1543  TSH 4.097   Anemia Panel: No results for input(s): "VITAMINB12", "FOLATE", "FERRITIN", "TIBC", "IRON", "RETICCTPCT" in the last 72 hours. Sepsis Labs: No results for input(s): "PROCALCITON", "LATICACIDVEN" in the last 168 hours.  No results found for this or any previous visit (from the past 240 hours).       Radiology Studies: No results found.       Scheduled Meds:  allopurinol  300 mg Oral Daily   carvedilol  3.125 mg Oral BID WC   colchicine  0.6 mg Oral QODAY   diltiazem  180 mg Oral Daily   doxycycline  100 mg Oral Q12H   ferrous sulfate  325 mg Oral Q breakfast   losartan  25 mg Oral Daily   melatonin  3 mg Oral QHS   mirtazapine  15 mg Oral QHS   senna-docusate  1 tablet Oral BID   sodium chloride  2 g Oral BID WC   Continuous Infusions:     LOS: 4 days    Time spent: 48 minutes spent on 06/11/2023 caring for this patient face-to-face including chart review, ordering labs/tests, documenting, discussion with nursing staff, consultants, updating family and interview/physical exam    [name] [name], DO [facility] Available via Epic secure chat 7am-7pm After these hours, please refer to coverage provider listed on  [email or web address] 06/11/2023, 10:23 AM

## 2023-06-12 DIAGNOSIS — E871 Hypo-osmolality and hyponatremia: Secondary | ICD-10-CM | POA: Diagnosis not present

## 2023-06-12 LAB — BASIC METABOLIC PANEL WITH GFR
Anion gap: 8 (ref 5–15)
BUN: 23 mg/dL (ref 8–23)
CO2: 26 mmol/L (ref 22–32)
Calcium: 9.3 mg/dL (ref 8.9–10.3)
Chloride: 102 mmol/L (ref 98–111)
Creatinine, Ser: 0.79 mg/dL (ref 0.44–1.00)
GFR, Estimated: >60 mL/min
Glucose, Bld: 82 mg/dL (ref 70–99)
Potassium: 3.6 mmol/L (ref 3.5–5.1)
Sodium: 136 mmol/L (ref 135–145)

## 2023-06-12 MED ORDER — LOSARTAN POTASSIUM 50 MG PO TABS
50.0000 mg | ORAL_TABLET | Freq: Every day | ORAL | Status: DC
  Administered 2023-06-12: 50 mg via ORAL
  Filled 2023-06-12: qty 1

## 2023-06-12 MED ORDER — POTASSIUM CHLORIDE CRYS ER 20 MEQ PO TBCR
30.0000 meq | EXTENDED_RELEASE_TABLET | Freq: Once | ORAL | Status: AC
  Administered 2023-06-12: 30 meq via ORAL
  Filled 2023-06-12: qty 1

## 2023-06-12 NOTE — Progress Notes (Signed)
 Physical Therapy Treatment Patient Details Name: CHAKA Wall MRN: [id number] DOB: 30-Mar-1929 Today's Date: 06/12/2023   History of Present Illness Pt is a 89 y.o. female presenting to [facility] ED from [facility] via EMS for concerns shortness of breath, weakness, acute on chronic hyponatremia. 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    PT Comments  Pt agreeable for OOB.  Pt assisted to standing in front of recliner and then requested to use bathroom.  Pt assisted into bathroom and washed hands at sink.  Pt then ambulated to recliner.  Pt reports generalized weakness.  Pt anticipates return to ALF upon d/c.     If plan is discharge home, recommend the following: A little help with walking and/or transfers;A little help with bathing/dressing/bathroom;Help with stairs or ramp for entrance;Assistance with cooking/housework;Direct supervision/assist for medications management;Direct supervision/assist for financial management;Assist for transportation   Can travel by private vehicle        Equipment Recommendations  None recommended by PT    Recommendations for Other Services       Precautions / Restrictions Precautions Precautions: Fall Precaution/Restrictions Comments: Noted a left post op shoe along with tennis shoe in pt's room. therapist donned pt's shoes for mobilizing.  Pt reports she still has posterior precautions for hip. (history of dislocations, no formal orders in chart upon eval). Cues for maintaining posterior hip precautions throughout mobility.     Mobility  Bed Mobility Overal bed mobility: Modified Independent                  Transfers Overall transfer level: Needs assistance Equipment used: Rolling walker (2 wheels) Transfers: Sit to/from Stand Sit to Stand: Min assist           General transfer  comment: pt did well with rise from slightly elevated bed and sitting down in recliner with use of arms to self assist; min assist with toilet transfer due to lower surface (wanted to into bathroom today)    Ambulation/Gait Ambulation/Gait assistance: Contact guard assist Gait Distance (Feet): 12 Feet (x2) Assistive device: Rolling walker (2 wheels) Gait Pattern/deviations: Step-through pattern, Decreased stride length, Knee flexed in stance - right, Knee flexed in stance - left Gait velocity: decreased     General Gait Details: slow but steady with RW, increased time; ambulated to/from bathroom per request (removed O2 [place] due to line not reaching but reapplied once in recliner)   Stairs             Wheelchair Mobility     Tilt Bed    Modified Rankin (Stroke Patients Only)       Balance Overall balance assessment: Needs assistance Sitting-balance support: Feet supported Sitting balance-Leahy Scale: Fair     Standing balance support: Bilateral upper extremity supported, During functional activity Standing balance-Leahy Scale: Poor                              Communication Communication Communication: No apparent difficulties  Cognition Arousal: Alert Behavior During Therapy: WFL for tasks assessed/performed                             Following commands: Intact      Cueing    Exercises      General Comments  Pertinent Vitals/Pain Pain Assessment Pain Assessment: No/denies pain    Home Living                          Prior Function            PT Goals (current goals can now be found in the care plan section) Progress towards PT goals: Progressing toward goals    Frequency    Min 2X/week      PT Plan      Co-evaluation              AM-PAC PT "6 Clicks" Mobility   Outcome Measure  Help needed turning from your back to your side while in a flat bed without using bedrails?: None Help  needed moving from lying on your back to sitting on the side of a flat bed without using bedrails?: None Help needed moving to and from a bed to a chair (including a wheelchair)?: A Little Help needed standing up from a chair using your arms (e.g., wheelchair or bedside chair)?: A Little Help needed to walk in hospital room?: A Little Help needed climbing 3-5 steps with a railing? : A Lot 6 Click Score: 19    End of Session Equipment Utilized During Treatment: Gait belt Activity Tolerance: Patient tolerated treatment well Patient left: in chair;with call bell/phone within reach (chair alarm set up but notified nurse tech to replace batteries) Nurse Communication: Mobility status PT Visit Diagnosis: Muscle weakness (generalized) (M62.81);Difficulty in walking, not elsewhere classified (R26.2)     Time: 7829-5621 PT Time Calculation (min) (ACUTE ONLY): 23 min  Charges:    $Gait Training: 8-22 mins $Therapeutic Activity: 8-22 mins PT General Charges $$ ACUTE PT VISIT: 1 Visit                     Thomasene Mohair PT, DPT Physical Therapist Acute Rehabilitation Services Office: [phone]    Kati L Payson 06/12/2023, 12:12 PM

## 2023-06-12 NOTE — Progress Notes (Signed)
 PROGRESS NOTE    KEYANAH KOZICKI  IRJ:[id number] DOB: 18-Oct-1929 DOA: 06/07/2023 PCP: Emilio Aspen, MD    Brief Narrative:   [name] is a 89 y.o. female with past medical history significant for HTN, anemia of chronic medical disease/iron deficiency, gout, chronic hyponatremia with baseline sodium 130-134, chronic respiratory failure on 2 L nasal cannula at baseline who presented to [facility] ED from [facility] via EMS for concerns shortness of breath, weakness.  Patient reports decreased appetite, has been taking her furosemide every other day.  Denies fever, no chills, no chest pain, no headache, no abdominal pain, no nausea/vomiting/diarrhea, no urinary symptoms.  In the ED, temperature 97.5 F, HR 66, RR 21, BP 167/72, SpO2 100% on 2 L nasal cannula which is her baseline.  WBC 6.8, hemoglobin 9.2, platelet count of 2 7.  Sodium 118, potassium 4.4, chloride 82, CO2 27, glucose 100, BUN 24, creatinine 0.80.  AST 28, ALT 22, total bilirubin 0.4.  Serum osmolality 262 (low).  Urinalysis unrevealing.  Chest x-ray with stable diffuse chronic lung opacities consistent with chronic interstitial lung disease.  TRH consulted for admission for acute on chronic hyponatremia  Assessment & Plan:   Acute on chronic hyponatremia Patient presenting to ED with progressive weakness, poor appetite.  Baseline sodium between 130-134.  Has been taking furosemide every other day.  Sodium 118 on admission.  Serum osmolality low at 262, urine sodium 40, urine osmolality low at 256.  TSH within normal limits.  Suspect etiology likely secondary to hypovolemic hyponatremia in the setting of diuretic use and poor oral intake.  ACTH stim test with appropriate response to cosyntropin. -- Na 118>125>123>125>126>127>130>136 -- NaCl tab 2g PO BID -- Hold home furosemide -- BMP in am  Essential hypertension -- Losartan increased to 50 mg p.o. daily -- Carvedilol 3.125 g p.o. twice  daily -- Diltiazem 180 mg p.o. daily -- Holding home furosemide; likely will change to PRN weight gain of 3 pounds in 1 day or 5 pounds in 1 week on discharge  Anemia of chronic medical disease/iron deficiency Hemoglobin 9.6, stable -- Ferrous sulfate 325 mg p.o. daily  Gout -- Allopurinol 3 mg p.o. daily  Concern for dysphagia Seen by speech therapy Regular;Thin liquid   Liquid Administration via: Cup;Straw Medication Administration: Other (Comment) (As tolerated) Supervision: Comment (Set up assisted patient able to feed herself) Compensations: Slow rate;Small sips/bites (Check for oral retention) Postural Changes: Seated upright at 90 degrees;Remain upright for at least 30 minutes after po intake   Chronic respiratory failure Maintains adequate oxygenation on her 2 L nasal cannula which is at her baseline.  Weakness/debility/deconditioning: Currently resides at [facility] -- PT evaluation>>no needs identified   DVT prophylaxis:     Code Status: Do not attempt resuscitation (DNR) PRE-ARREST INTERVENTIONS DESIRED Family Communication: No family present at bedside this morning, updated patient's son at bedside yesterday afternoon  Disposition Plan:  Level of care: Telemetry Status is: Inpatient Remains inpatient appropriate because: needs improvement of sodium level before stable for discharge back to ALF; anticipate tomorrow    Consultants:  None  Procedures:  None  Antimicrobials:  None   Subjective: Patient seen examined bedside, lying in bed.  Awake.  Sodium up to 136 today. No specific complaints, questions or concerns at this time.  Denies headache, no fever/chills/night sweats, no nausea/vomiting/diarrhea, no chest pain, no shortness of breath, no abdominal pain.  Discussed with RN this morning, no acute concerns overnight per  nursing staff.  Anticipate discharge back to ALF tomorrow as long as sodium remains stable.  Objective: Vitals:   06/11/23  1214 06/11/23 1413 06/11/23 2037 06/12/23 0419  BP: (!) 171/82 133/69 (!) 163/84 (!) 187/84  Pulse: 61 68 79 66  Resp:      Temp:   97.9 F (36.6 C) 97.8 F (36.6 C)  TempSrc:    Oral  SpO2:   100% 100%  Weight:      Height:        Intake/Output Summary (Last 24 hours) at 06/12/2023 1046 Last data filed at 06/11/2023 1300 Gross per 24 hour  Intake 60 ml  Output --  Net 60 ml   Filed Weights   06/08/23 1447  Weight: 49.9 kg    Examination:  Physical Exam: GEN: NAD, alert and oriented x 3, elderly in appearance HEENT: NCAT, PERRL, EOMI, sclera clear, MMM PULM: CTAB w/o wheezes/crackles, normal respiratory effort, on 2 L nasal cannula which is at baseline CV: RRR w/o M/G/R GI: abd soft, NTND, NABS, no R/G/M MSK: + Bilateral symmetric peripheral edema, moves all extremities dependently NEURO: CN II-XII intact, no focal deficits, sensation to light touch intact PSYCH: normal mood/affect Integumentary: dry/intact, no rashes or wounds    Data Reviewed: I have personally reviewed following labs and imaging studies  CBC: Recent Labs  Lab 06/07/23 1340 06/08/23 0644  WBC 6.8 7.5  NEUTROABS 4.7  --   HGB 9.2* 9.6*  HCT 28.6* 30.2*  MCV 80.8 81.8  PLT 270 264   Basic Metabolic Panel: Recent Labs  Lab 06/07/23 1340 06/08/23 0644 06/08/23 1543 06/08/23 2345 06/09/23 0831 06/09/23 1304 06/09/23 1848 06/10/23 0859 06/11/23 0847 06/12/23 0402  NA 118* 125*   < > 125*   < > 126* 127* 127* 130* 136  K 4.4 3.9  --  4.6  --   --   --  4.1 4.4 3.6  CL 82* 91*  --  93*  --   --   --  98 101 102  CO2 27 25  --  25  --   --   --  22 22 26   GLUCOSE 100* 83  --  75  --   --   --  92 97 82  BUN 24* 21  --  25*  --   --   --  27* 18 23  CREATININE 0.80 0.70  --  0.80  --   --   --  0.80 0.58 0.79  CALCIUM 9.0 9.1  --  8.7*  --   --   --  8.9 9.0 9.3  MG 1.9  --   --   --   --   --   --   --   --   --    < > = values in this interval not displayed.   GFR: Estimated  Creatinine Clearance: 36.1 mL/min (by C-G formula based on SCr of 0.79 mg/dL). Liver Function Tests: Recent Labs  Lab 06/07/23 1340 06/08/23 0644  AST 28 27  ALT 22 20  ALKPHOS 86 79  BILITOT 0.4 0.5  PROT 7.7 7.4  ALBUMIN 3.2* 3.2*   No results for input(s): "LIPASE", "AMYLASE" in the last 168 hours. No results for input(s): "AMMONIA" in the last 168 hours. Coagulation Profile: No results for input(s): "INR", "PROTIME" in the last 168 hours. Cardiac Enzymes: No results for input(s): "CKTOTAL", "CKMB", "CKMBINDEX", "TROPONINI" in the last 168 hours. BNP (last 3 results) No results  for input(s): "PROBNP" in the last 8760 hours. HbA1C: No results for input(s): "HGBA1C" in the last 72 hours. CBG: No results for input(s): "GLUCAP" in the last 168 hours. Lipid Profile: No results for input(s): "CHOL", "HDL", "LDLCALC", "TRIG", "CHOLHDL", "LDLDIRECT" in the last 72 hours. Thyroid Function Tests: No results for input(s): "TSH", "T4TOTAL", "FREET4", "T3FREE", "THYROIDAB" in the last 72 hours.  Anemia Panel: No results for input(s): "VITAMINB12", "FOLATE", "FERRITIN", "TIBC", "IRON", "RETICCTPCT" in the last 72 hours. Sepsis Labs: No results for input(s): "PROCALCITON", "LATICACIDVEN" in the last 168 hours.  No results found for this or any previous visit (from the past 240 hours).       Radiology Studies: No results found.       Scheduled Meds:  allopurinol  300 mg Oral Daily   carvedilol  3.125 mg Oral BID WC   colchicine  0.6 mg Oral QODAY   diltiazem  180 mg Oral Daily   doxycycline  100 mg Oral Q12H   ferrous sulfate  325 mg Oral Q breakfast   losartan  50 mg Oral Daily   melatonin  3 mg Oral QHS   mirtazapine  15 mg Oral QHS   senna-docusate  1 tablet Oral BID   sodium chloride  2 g Oral BID WC   Continuous Infusions:     LOS: 5 days    Time spent: 48 minutes spent on 06/12/2023 caring for this patient face-to-face including chart review, ordering  labs/tests, documenting, discussion with nursing staff, consultants, updating family and interview/physical exam    [name] [name], DO [facility] Available via Epic secure chat 7am-7pm After these hours, please refer to coverage provider listed on [email or web address] 06/12/2023, 10:46 AM

## 2023-06-12 NOTE — Plan of Care (Signed)

## 2023-06-13 DIAGNOSIS — E871 Hypo-osmolality and hyponatremia: Secondary | ICD-10-CM | POA: Diagnosis not present

## 2023-06-13 LAB — BASIC METABOLIC PANEL WITH GFR
Anion gap: 5 (ref 5–15)
BUN: 27 mg/dL — ABNORMAL HIGH (ref 8–23)
CO2: 24 mmol/L (ref 22–32)
Calcium: 9 mg/dL (ref 8.9–10.3)
Chloride: 103 mmol/L (ref 98–111)
Creatinine, Ser: 0.9 mg/dL (ref 0.44–1.00)
GFR, Estimated: >60 mL/min (ref >60)
Glucose, Bld: 84 mg/dL (ref 70–99)
Potassium: 4.4 mmol/L (ref 3.5–5.1)
Sodium: 132 mmol/L — ABNORMAL LOW (ref 135–145)

## 2023-06-13 MED ORDER — LOSARTAN POTASSIUM 100 MG PO TABS: 100.0000 mg | ORAL_TABLET | Freq: Every day | ORAL | 0 refills | Status: AC

## 2023-06-13 MED ORDER — SODIUM CHLORIDE 1 G PO TABS: 2.0000 g | ORAL_TABLET | Freq: Two times a day (BID) | ORAL | 0 refills | Status: AC

## 2023-06-13 MED ORDER — FUROSEMIDE 20 MG PO TABS
20.0000 mg | ORAL_TABLET | Freq: Every day | ORAL | 2 refills | Status: AC | PRN
Start: 2023-06-13 — End: 2024-06-12

## 2023-06-13 MED ORDER — LOSARTAN POTASSIUM 50 MG PO TABS
100.0000 mg | ORAL_TABLET | Freq: Every day | ORAL | Status: DC
Start: 2023-06-13 — End: 2023-06-13
  Administered 2023-06-13: 100 mg via ORAL
  Filled 2023-06-13: qty 2

## 2023-06-13 MED ORDER — MIRTAZAPINE 15 MG PO TABS: 15.0000 mg | ORAL_TABLET | Freq: Every day | ORAL | 0 refills | Status: AC

## 2023-06-13 NOTE — Progress Notes (Signed)
 Report called and given to [facility].

## 2023-06-13 NOTE — Plan of Care (Signed)
  Problem: Education: Goal: Knowledge of General Education information will improve Description: Including pain rating scale, medication(s)/side effects and non-pharmacologic comfort measures Outcome: Progressing   Problem: Clinical Measurements: Goal: Ability to maintain clinical measurements within normal limits will improve Outcome: Progressing   Problem: Coping: Goal: Level of anxiety will decrease Outcome: Progressing   Problem: Elimination: Goal: Will not experience complications related to bowel motility Outcome: Progressing   

## 2023-06-13 NOTE — Progress Notes (Signed)
 Attempted to call [facility] to give report, no answer.

## 2023-06-13 NOTE — TOC Transition Note (Signed)
 Transition of Care [facility]) - Discharge Note  Patient Details  Name: [name] MRN: [id number] Date of Birth: 01-26-1930  Transition of Care [facility]) CM/SW Contact:  Ewing Schlein, LCSW Phone Number: 06/13/2023, 2:28 PM  Clinical Narrative: Patient is medically stable to return to [facility] and will need PTAR for discharge per son. FL2 done. CSW spoke with Billy Coast, RN at the ALF, at [facility] to confirm return. FL2 and discharge summary faxed to [phone] for review. CSW received return call from Ms. [name] and she confirmed the discharge paperwork is good, so transportation can be set up. Medical necessity form done; PTAR called. Discharge packet completed. CSW updated son and Charity fundraiser. TOC signing off.  Final next level of care: Assisted Living Barriers to Discharge: Barriers Resolved  Patient Goals and CMS Choice Patient states their goals for this hospitalization and ongoing recovery are:: return to facility Choice offered to / list presented to : NA  Discharge Placement  Patient chooses bed at: [facility] Patient to be transferred to facility by: PTAR Name of family member notified: [name] (son) Patient and family notified of of transfer: 06/13/23  Discharge Plan and Services Additional resources added to the After Visit Summary for      DME Arranged: N/A DME Agency: NA  Social Drivers of Health (SDOH) Interventions SDOH Screenings   Food Insecurity: No Food Insecurity (06/08/2023)  Housing: Low Risk  (06/08/2023)  Transportation Needs: No Transportation Needs (06/08/2023)  Utilities: Not At Risk (06/08/2023)  Alcohol Screen: Low Risk  (01/19/2021)  Depression (PHQ2-9): Low Risk  (06/10/2021)  Social Connections: Unknown (06/08/2023)  Tobacco Use: Medium Risk (06/08/2023)   Readmission Risk Interventions    06/13/2023    2:28 PM 04/15/2023   12:50 PM  Readmission Risk Prevention Plan  Transportation Screening Complete Complete  PCP or  Specialist Appt within 3-5 Days  Complete  HRI or Home Care Consult Complete Complete  Social Work Consult for Recovery Care Planning/Counseling Complete Complete  Palliative Care Screening Not Applicable Not Applicable  Medication Review Oceanographer) Complete Complete

## 2023-06-13 NOTE — [place]2 (Addendum)
 [place] MEDICAID FL2 LEVEL OF CARE FORM     IDENTIFICATION  Patient Name: [name] Birthdate: 1929/11/17 Sex: female Admission Date (Current Location): 06/07/2023  [facility] and [place] Number:  Producer, television/film/video and Address:  [facility],  [address], [place] [address]      Provider Number: [id number]  Attending Physician Name and Address:  [name], [name], DO  Relative Name and Phone Number:  [name] (Son)  [phone] [facility])    Current Level of Care: Other (Comment) (ALF) Recommended Level of Care: Assisted Living Facility Prior Approval Number:    Date Approved/Denied:   PASRR Number:    Discharge Plan: Other (Comment) (ALF)    Current Diagnoses: Patient Active Problem List   Diagnosis Date Noted   Hyponatremia 06/07/2023   Failure to thrive in adult 06/07/2023   Closed anterior dislocation of left hip (HCC) 04/12/2023   Closed dislocation of left hip, subsequent encounter 04/12/2023   Ulcer of foot, unspecified laterality, limited to breakdown of skin (HCC) 03/23/2023   Osteoarthritis of right glenohumeral joint 02/25/2023   Chronic diastolic CHF (congestive heart failure) (HCC) 12/09/2022   Acute encephalopathy 12/07/2022   Dehydration 12/07/2022   Acute prerenal azotemia 12/07/2022   Leukocytosis 12/07/2022   Chronic hyponatremia 12/07/2022   ILD (interstitial lung disease) (HCC) 10/20/2022   CAP (community acquired pneumonia) 06/12/2022   Chronic heart failure with preserved ejection fraction (HFpEF) (HCC) 04/07/2022   Advanced care planning/counseling discussion 04/07/2022   Chronic venous stasis dermatitis of both lower extremities 04/07/2022   Malaise and fatigue 07/15/2021   Pain of left hip joint 05/20/2021   Primary osteoarthritis 04/20/2021   Inflammatory polyarthropathy (HCC) 04/20/2021   Hyperuricemia 04/20/2021   Generalized weakness 04/20/2021   Chronic kidney disease, stage 3 unspecified (HCC)  04/20/2021   Adjustment disorder with mixed disturbance of emotions and conduct 01/20/2021   Severe recurrent major depression without psychotic features (HCC) 01/19/2021   Pincer nail deformity 12/01/2020   Iron deficiency anemia 03/24/2020   Clavi 02/19/2020   Lumbar radiculopathy 06/17/2019   Pain due to onychomycosis of toenails of both feet 05/14/2019   Mixed incontinence 03/10/2019   Cervical spondylosis 06/07/2018   DDD (degenerative disc disease), cervical 05/20/2018   History of total knee replacement, left 04/13/2018   History of total knee replacement, right 04/13/2018   History of revision of total replacement of left hip joint 04/13/2018   Presence of left artificial knee joint 04/13/2018   Gait abnormality 09/08/2017   Chronic cystitis 07/05/2017   Dysuria 07/05/2017   Anemia of chronic disease 09/22/2015   h/o pericarditis    Essential hypertension    Aortic insufficiency    Posterior vitreous detachment, bilateral 06/05/2015   Left wrist pain 05/16/2015   Triquetral chip fracture, left, closed, initial encounter 05/16/2015   Primary osteoarthritis of first carpometacarpal joint of right hand 02/11/2015   Primary osteoarthritis of right wrist 02/11/2015   Arthropathy 11/25/2014   Multiple skin tears 11/25/2014   Hip dislocation, left (HCC) 12/05/2013   Trochanteric bursitis of left hip 11/24/2012   Aneurysm of thoracic aorta (HCC)    Nonexudative age-related macular degeneration 04/22/2011   Posterior capsular opacification, right 04/22/2011   Status post intraocular lens implant 04/22/2011    Orientation RESPIRATION BLADDER Height & Weight     Self, Time, Situation, Place  O2 (2L) Continent Weight: 110 lb 0.2 oz (49.9 kg) Height:  5' 4.5" (163.8 cm)  BEHAVIORAL SYMPTOMS/MOOD NEUROLOGICAL BOWEL NUTRITION STATUS  Continent Diet (regular)  AMBULATORY STATUS COMMUNICATION OF NEEDS Skin   Limited Assist Verbally Normal                        Personal Care Assistance Level of Assistance  Bathing, Feeding, Dressing Bathing Assistance: Limited assistance Feeding assistance: Independent Dressing Assistance: Limited assistance     Functional Limitations Info  Sight, Hearing, Speech Sight Info: Adequate Hearing Info: Adequate Speech Info: Adequate    SPECIAL CARE FACTORS FREQUENCY                       Contractures Contractures Info: Not present    Additional Factors Info  Code Status, Allergies Code Status Info: DNR Allergies Info: Cephalexin, Shellfish-derived Products, Tape, Clindamycin/lincomycin, Lincomycin, Clindamycin Hcl, Fosfomycin, Levofloxacin, Other, Penicillamine, Prednisone, Sugar-protein-starch, Ciprofloxacin, Sulfa Antibiotics, Sulfites           Current Medications (06/13/2023):  This is the current hospital active medication list Current Facility-Administered Medications  Medication Dose Route Frequency Provider Last Rate Last Admin   acetaminophen (TYLENOL) tablet 650 mg  650 mg Oral Q6H PRN Bobette Mo, MD   650 mg at 06/11/23 2019   Or   acetaminophen (TYLENOL) suppository 650 mg  650 mg Rectal Q6H PRN Bobette Mo, MD       allopurinol (ZYLOPRIM) tablet 300 mg  300 mg Oral Daily [name], [name], DO   300 mg at 06/12/23 0818   carvedilol (COREG) tablet 3.125 mg  3.125 mg Oral BID WC Bobette Mo, MD   3.125 mg at 06/13/23 0347   colchicine tablet 0.6 mg  0.6 mg Oral QODAY [name], [name], DO   0.6 mg at 06/12/23 0818   diltiazem (CARDIZEM CD) 24 hr capsule 180 mg  180 mg Oral Daily Bobette Mo, MD   180 mg at 06/12/23 4259   doxycycline (VIBRA-TABS) tablet 100 mg  100 mg Oral Q12H [name], [name], DO   100 mg at 06/12/23 2044   ferrous sulfate tablet 325 mg  325 mg Oral Q breakfast [name], [name], DO   325 mg at 06/13/23 0900   hydrALAZINE (APRESOLINE) tablet 10 mg  10 mg Oral Q6H PRN [name], [name], DO   10 mg at 06/12/23 2150   losartan (COZAAR) tablet  100 mg  100 mg Oral Daily [name], [name], DO       melatonin tablet 3 mg  3 mg Oral QHS [name], [name], DO   3 mg at 06/12/23 2044   mirtazapine (REMERON) tablet 15 mg  15 mg Oral QHS [name], [name], DO   15 mg at 06/12/23 2044   ondansetron [facility]) tablet 4 mg  4 mg Oral Q6H PRN Bobette Mo, MD       Or   ondansetron [facility]) injection 4 mg  4 mg Intravenous Q6H PRN Bobette Mo, MD       polyethylene glycol [facility]) packet 17 g  17 g Oral Daily PRN [name], [name], DO   17 g at 06/12/23 0818   senna-docusate (Senokot-S) tablet 1 tablet  1 tablet Oral BID [name], [name], DO   1 tablet at 06/12/23 2044   sodium chloride tablet 2 g  2 g Oral BID WC [name], [name], DO   2 g at 06/13/23 0900     Discharge Medications: Please see discharge summary for a list of discharge medications. acetaminophen 500  MG tablet Commonly known as: TYLENOL Take 1,000 mg by mouth every 6 (six) hours as needed (for pain).    allopurinol 300 MG tablet Commonly known as: ZYLOPRIM Take 300 mg by mouth daily.    amoxicillin 500 MG capsule Commonly known as: AMOXIL Take 2,000 mg by mouth See admin instructions. Take 2,000 mg by mouth one hour prior to dental procedures    bisacodyl 5 MG EC tablet Commonly known as: DULCOLAX Take 10 mg by mouth daily as needed (for constipation).    calcium carbonate 500 MG chewable tablet Commonly known as: TUMS - dosed in mg elemental calcium Chew 2 tablets by mouth every 6 (six) hours as needed for indigestion or heartburn (MAX OF 8 TABLETS IN 24 HOURS).    carvedilol 3.125 MG tablet Commonly known as: COREG Take 3.125 mg by mouth 2 (two) times daily with a meal.    CETAPHIL MOISTURIZING EX Apply 1 application  topically See admin instructions. Apply to the body 2 times a day    colchicine 0.6 MG tablet Take 0.6 mg by mouth every other day.    diltiazem 180 MG 24 hr capsule Commonly known as: CARDIZEM CD Take 180 mg by mouth  daily.    FeroSul 325 (65 Fe) MG tablet Generic drug: ferrous sulfate Take 325 mg by mouth daily with breakfast.    furosemide 20 MG tablet Commonly known as: Lasix Take 1 tablet (20 mg total) by mouth daily as needed for edema or fluid (Weight gain of 3 pounds in 1 day or 5 pounds in 1 week). What changed:  when to take this reasons to take this    losartan 100 MG tablet Commonly known as: COZAAR Take 1 tablet (100 mg total) by mouth daily. What changed:  medication strength how much to take    melatonin 3 MG Tabs tablet Take 1 tablet (3 mg total) by mouth at bedtime as needed (insomnia). What changed: when to take this    Minerin Creme Crea Apply 1 Application topically See admin instructions. Apply to lower extremities twice a day What changed: Another medication with the same name was removed. Continue taking this medication, and follow the directions you see here.    mirtazapine 15 MG tablet Commonly known as: REMERON Take 1 tablet (15 mg total) by mouth at bedtime.    NON FORMULARY Take 3 capsules by mouth See admin instructions. Whole produce "FRUIT" and "VEGGIES" capsules- Take 3 capsules of each formulation by mouth in the morning    NON FORMULARY Take 4 oz by mouth See admin instructions. PRUNE JUICE- Drink 4 ounces of prune juice by mouth in the morning    OVER THE COUNTER MEDICATION Take 1 Scoop by mouth See admin instructions. AG1 Athletic Greens packets- Mix the contents of one packet into 8 ounces of water and drink by mouth once a day    OXYGEN Inhale 2 L/min into the lungs at bedtime. 2 L/min at bedtime AND as needed for shortness of breath    sodium chloride 1 g tablet Take 2 tablets (2 g total) by mouth 2 (two) times daily with a meal. What changed:  how much to take when to take this    triamcinolone cream 0.1 % Commonly known as: KENALOG Apply topically 3 (three) times daily. To areas of psoariatic rash. Do not apply to face. What changed:   how much to take when to take this reasons to take this additional instructions    Vitamin B  Complex Tabs Take 1 tablet by mouth daily with breakfast.    Vitamin D3 1000 units Caps Take 1,000 Units by mouth in the morning.   Relevant Imaging Results:  Relevant Lab Results:   Additional Information SSN: [id number]  Ewing Schlein, LCSW

## 2023-06-13 NOTE — Discharge Summary (Signed)
 Physician Discharge Summary  ADALI PENNINGS TDD:[id number] DOB: [birth year] DOA: 06/07/2023  PCP: Emilio Aspen, MD  Admit date: 06/07/2023 Discharge date: 06/13/2023  Admitted From: [facility] Disposition: [facility]  Recommendations for Outpatient Follow-up:  Follow up with PCP in 1-2 weeks Please obtain BMP in one week to recheck sodium level Increase sodium chloride to 2 g p.o. twice daily Increase losartan to 100 mg p.o. twice daily for poorly controlled blood pressure Change furosemide to 20 mg p.o. daily as needed for weight gain of 3 pounds in 1 day or 5 pounds in 1 week Started on Remeron for appetite stimulation Continue to monitor daily weights  Home Health: No needs identified by therapy while inpatient Equipment/Devices: Continues on her baseline oxygen via 2 L nasal cannula  Discharge Condition: Stable CODE STATUS: DNR Diet recommendation: Regular diet  History of present illness:  [name] is a 89 y.o. female with past medical history significant for HTN, anemia of chronic medical disease/iron deficiency, gout, chronic hyponatremia with baseline sodium 130-134, chronic respiratory failure on 2 L nasal cannula at baseline who presented to [facility] ED from [facility] via EMS for concerns shortness of breath, weakness.  Patient reports decreased appetite, has been taking her furosemide every other day.  Denies fever, no chills, no chest pain, no headache, no abdominal pain, no nausea/vomiting/diarrhea, no urinary symptoms.   In the ED, temperature 97.5 F, HR 66, RR 21, BP 167/72, SpO2 100% on 2 L nasal cannula which is her baseline.  WBC 6.8, hemoglobin 9.2, platelet count of 2 7.  Sodium 118, potassium 4.4, chloride 82, CO2 27, glucose 100, BUN 24, creatinine 0.80.  AST 28, ALT 22, total bilirubin 0.4.  Serum osmolality 262 (low).  Urinalysis unrevealing.  Chest x-ray with stable diffuse chronic lung opacities consistent with  chronic interstitial lung disease.  TRH consulted for admission for acute on chronic hyponatremia  Hospital course:  Acute on chronic hyponatremia Patient presenting to ED with progressive weakness, poor appetite.  Baseline sodium between 130-134.  Has been taking furosemide every other day.  Sodium 118 on admission.  Serum osmolality low at 262, urine sodium 40, urine osmolality low at 256.  TSH within normal limits.  Suspect etiology likely secondary to hypovolemic hyponatremia in the setting of diuretic use and poor oral intake.  ACTH stim test with appropriate response to cosyntropin.  Patient was started on IV fluids and sodium chloride tablets increased to 2 g p.o. twice daily.  Sodium improved to 132 at time of discharge.  Recommend repeat BMP 1 week.  Continue to encourage increased oral intake is likely significant contributing factor.   Essential hypertension Losartan increased to 100 mg p.o. daily, continue carvedilol 3.125 g p.o. twice daily, Diltiazem 180 mg p.o. daily.  Continue furosemide but changed to PRN weight gain of 3 pounds in 1 day or 5 pounds in 1 week.   Anemia of chronic medical disease/iron deficiency Hemoglobin 9.6, stable. Ferrous sulfate 325 mg p.o. daily   Gout Allopurinol 3 mg p.o. daily   Concern for dysphagia Seen by speech therapy Regular;Thin liquid   Liquid Administration via: Cup;Straw Medication Administration: Other (Comment) (As tolerated) Supervision: Comment (Set up assisted patient able to feed herself) Compensations: Slow rate;Small sips/bites (Check for oral retention) Postural Changes: Seated upright at 90 degrees;Remain upright for at least 30 minutes after po intake    Chronic respiratory failure Maintains adequate oxygenation on her 2 L nasal cannula  which is at her baseline.   Weakness/debility/deconditioning: Currently resides at [facility], seen by PT with no needs identified.  Discharge Diagnoses:  Principal Problem:    Chronic hyponatremia Active Problems:   Aneurysm of thoracic aorta (HCC)   Essential hypertension   Anemia of chronic disease   Generalized weakness   Chronic kidney disease, stage 3 unspecified (HCC)   ILD (interstitial lung disease) (HCC)   Chronic diastolic CHF (congestive heart failure) (HCC)   Hyponatremia   Failure to thrive in adult    Discharge Instructions  Discharge Instructions     Call MD for:  difficulty breathing, headache or visual disturbances   Complete by: As directed    Call MD for:  extreme fatigue   Complete by: As directed    Call MD for:  persistant dizziness or light-headedness   Complete by: As directed    Call MD for:  persistant nausea and vomiting   Complete by: As directed    Call MD for:  severe uncontrolled pain   Complete by: As directed    Call MD for:  temperature >100.4   Complete by: As directed    Diet - low sodium heart healthy   Complete by: As directed    Increase activity slowly   Complete by: As directed       Allergies as of 06/13/2023       Reactions   Cephalexin Other (See Comments)   Pt states this caused eye problems and STROKE-LIKE SYMPTOMS!!   Shellfish-derived Products Hives, Rash   Tape Other (See Comments)   Please use paper tape or Coban Wrap; patient's skin tears VERY easily!!   Clindamycin/lincomycin Rash   Lincomycin Rash   Clindamycin Hcl Other (See Comments)   "Per pharmacist"   Fosfomycin Other (See Comments)   Disorientation   Levofloxacin Other (See Comments)   Reaction not recalled by the patient   Other Other (See Comments)   "Steroids" = "Allergic," per [facility]   Penicillamine Other (See Comments)   Reaction not recalled by the patient   Prednisone Other (See Comments)   "Allergic," per [facility]   Sugar-protein-starch Other (See Comments)   Sugary foods cause legs to cramp   Ciprofloxacin Rash   Sulfa Antibiotics Hives, Rash   Sulfites Hives, Swelling, Rash        Medication List     STOP  taking these medications    doxycycline 100 MG tablet Commonly known as: VIBRA-TABS   mupirocin ointment 2 % Commonly known as: BACTROBAN       TAKE these medications    acetaminophen 500 MG tablet Commonly known as: TYLENOL Take 1,000 mg by mouth every 6 (six) hours as needed (for pain).   allopurinol 300 MG tablet Commonly known as: ZYLOPRIM Take 300 mg by mouth daily.   amoxicillin 500 MG capsule Commonly known as: AMOXIL Take 2,000 mg by mouth See admin instructions. Take 2,000 mg by mouth one hour prior to dental procedures   bisacodyl 5 MG EC tablet Commonly known as: DULCOLAX Take 10 mg by mouth daily as needed (for constipation).   calcium carbonate 500 MG chewable tablet Commonly known as: TUMS - dosed in mg elemental calcium Chew 2 tablets by mouth every 6 (six) hours as needed for indigestion or heartburn (MAX OF 8 TABLETS IN 24 HOURS).   carvedilol 3.125 MG tablet Commonly known as: COREG Take 3.125 mg by mouth 2 (two) times daily with a meal.   CETAPHIL MOISTURIZING EX  Apply 1 application  topically See admin instructions. Apply to the body 2 times a day   colchicine 0.6 MG tablet Take 0.6 mg by mouth every other day.   diltiazem 180 MG 24 hr capsule Commonly known as: CARDIZEM CD Take 180 mg by mouth daily.   FeroSul 325 (65 Fe) MG tablet Generic drug: ferrous sulfate Take 325 mg by mouth daily with breakfast.   furosemide 20 MG tablet Commonly known as: Lasix Take 1 tablet (20 mg total) by mouth daily as needed for edema or fluid (Weight gain of 3 pounds in 1 day or 5 pounds in 1 week). What changed:  when to take this reasons to take this   losartan 100 MG tablet Commonly known as: COZAAR Take 1 tablet (100 mg total) by mouth daily. What changed:  medication strength how much to take   melatonin 3 MG Tabs tablet Take 1 tablet (3 mg total) by mouth at bedtime as needed (insomnia). What changed: when to take this   Minerin Creme  Crea Apply 1 Application topically See admin instructions. Apply to lower extremities twice a day What changed: Another medication with the same name was removed. Continue taking this medication, and follow the directions you see here.   mirtazapine 15 MG tablet Commonly known as: REMERON Take 1 tablet (15 mg total) by mouth at bedtime.   NON FORMULARY Take 3 capsules by mouth See admin instructions. Whole produce "FRUIT" and "VEGGIES" capsules- Take 3 capsules of each formulation by mouth in the morning   NON FORMULARY Take 4 oz by mouth See admin instructions. PRUNE JUICE- Drink 4 ounces of prune juice by mouth in the morning   OVER THE COUNTER MEDICATION Take 1 Scoop by mouth See admin instructions. AG1 Athletic Greens packets- Mix the contents of one packet into 8 ounces of water and drink by mouth once a day   OXYGEN Inhale 2 L/min into the lungs at bedtime. 2 L/min at bedtime AND as needed for shortness of breath   sodium chloride 1 g tablet Take 2 tablets (2 g total) by mouth 2 (two) times daily with a meal. What changed:  how much to take when to take this   triamcinolone cream 0.1 % Commonly known as: KENALOG Apply topically 3 (three) times daily. To areas of psoariatic rash. Do not apply to face. What changed:  how much to take when to take this reasons to take this additional instructions   Vitamin B Complex Tabs Take 1 tablet by mouth daily with breakfast.   Vitamin D3 1000 units Caps Take 1,000 Units by mouth in the morning.        Follow-up Information     Emilio Aspen, MD. Schedule an appointment as soon as possible for a visit in 1 week(s).   Specialty: Internal Medicine Contact information: [address] [place] [address] [phone]                Allergies  Allergen Reactions   Cephalexin Other (See Comments)    Pt states this caused eye problems and STROKE-LIKE SYMPTOMS!!   Shellfish-Derived Products  Hives and Rash   Tape Other (See Comments)    Please use paper tape or Coban Wrap; patient's skin tears VERY easily!!   Clindamycin/Lincomycin Rash   Lincomycin Rash   Clindamycin Hcl Other (See Comments)    "Per pharmacist"   Fosfomycin Other (See Comments)    Disorientation   Levofloxacin Other (See Comments)  Reaction not recalled by the patient   Other Other (See Comments)    "Steroids" = "Allergic," per [facility]   Penicillamine Other (See Comments)    Reaction not recalled by the patient   Prednisone Other (See Comments)    "Allergic," per [facility]   Sugar-Protein-Starch Other (See Comments)    Sugary foods cause legs to cramp   Ciprofloxacin Rash   Sulfa Antibiotics Hives and Rash   Sulfites Hives, Swelling and Rash    Consultations: none   Procedures/Studies: DG Chest Portable 1 View Result Date: 06/07/2023 CLINICAL DATA:  Shortness of breath. EXAM: PORTABLE CHEST 1 VIEW COMPARISON:  March 28, 2023. FINDINGS: Stable cardiomediastinal silhouette. Stable diffuse reticular densities are noted most consistent with chronic interstitial lung disease. No definite acute abnormality is noted. Bony thorax is unremarkable. IMPRESSION: Stable diffuse chronic lung opacities are noted most consistent with chronic interstitial lung disease. Electronically Signed   By: Lupita Raider M.D.   On: 06/07/2023 15:49   DG Foot Complete Left Result Date: 05/19/2023 Please see detailed radiograph report in office note.    Subjective:  Patient seen examined bedside, lying in bed.  Awake.  Sodium 132 today. No specific complaints, questions or concerns at this time.  Denies headache, no fever/chills/night sweats, no nausea/vomiting/diarrhea, no chest pain, no shortness of breath, no abdominal pain.  No acute concerns overnight per nursing staff.  Discharging back to ALF today.  Discharge Exam: Vitals:   06/13/23 0456 06/13/23 0859  BP: (!) 166/83 (!) 152/92  Pulse: 71 91  Resp: 14   Temp: 98.7  F (37.1 C)   SpO2: 97%    Vitals:   06/12/23 1735 06/12/23 2110 06/13/23 0456 06/13/23 0859  BP: (!) 168/77 (!) 184/91 (!) 166/83 (!) 152/92  Pulse: 72 69 71 91  Resp:  18 14   Temp:  98.1 F (36.7 C) 98.7 F (37.1 C)   TempSrc:  Oral    SpO2:  100% 97%   Weight:      Height:        Physical Exam: GEN: NAD, alert and oriented x 3, elderly in appearance HEENT: NCAT, PERRL, EOMI, sclera clear, MMM PULM: CTAB w/o wheezes/crackles, normal respiratory effort, on 2 L nasal cannula which is at baseline CV: RRR w/o M/G/R GI: abd soft, NTND, NABS, no R/G/M MSK: + Bilateral symmetric peripheral edema, moves all extremities dependently NEURO: CN II-XII intact, no focal deficits, sensation to light touch intact PSYCH: normal mood/affect Integumentary: dry/intact, no rashes or wounds    The results of significant diagnostics from this hospitalization (including imaging, microbiology, ancillary and laboratory) are listed below for reference.     Microbiology: No results found for this or any previous visit (from the past 240 hours).   Labs: BNP (last 3 results) No results for input(s): "BNP" in the last 8760 hours. Basic Metabolic Panel: Recent Labs  Lab 06/07/23 1340 06/08/23 0644 06/08/23 2345 06/09/23 0831 06/09/23 1848 06/10/23 0859 06/11/23 0847 06/12/23 0402 06/13/23 0337  NA 118*   < > 125*   < > 127* 127* 130* 136 132*  K 4.4   < > 4.6  --   --  4.1 4.4 3.6 4.4  CL 82*   < > 93*  --   --  98 101 102 103  CO2 27   < > 25  --   --  22 22 26 24   GLUCOSE 100*   < > 75  --   --  92 97 82 84  BUN 24*   < > 25*  --   --  27* 18 23 27*  CREATININE 0.80   < > 0.80  --   --  0.80 0.58 0.79 0.90  CALCIUM 9.0   < > 8.7*  --   --  8.9 9.0 9.3 9.0  MG 1.9  --   --   --   --   --   --   --   --    < > = values in this interval not displayed.   Liver Function Tests: Recent Labs  Lab 06/07/23 1340 06/08/23 0644  AST 28 27  ALT 22 20  ALKPHOS 86 79  BILITOT 0.4 0.5   PROT 7.7 7.4  ALBUMIN 3.2* 3.2*   No results for input(s): "LIPASE", "AMYLASE" in the last 168 hours. No results for input(s): "AMMONIA" in the last 168 hours. CBC: Recent Labs  Lab 06/07/23 1340 06/08/23 0644  WBC 6.8 7.5  NEUTROABS 4.7  --   HGB 9.2* 9.6*  HCT 28.6* 30.2*  MCV 80.8 81.8  PLT 270 264   Cardiac Enzymes: No results for input(s): "CKTOTAL", "CKMB", "CKMBINDEX", "TROPONINI" in the last 168 hours. BNP: Invalid input(s): "POCBNP" CBG: No results for input(s): "GLUCAP" in the last 168 hours. D-Dimer No results for input(s): "DDIMER" in the last 72 hours. Hgb A1c No results for input(s): "HGBA1C" in the last 72 hours. Lipid Profile No results for input(s): "CHOL", "HDL", "LDLCALC", "TRIG", "CHOLHDL", "LDLDIRECT" in the last 72 hours. Thyroid function studies No results for input(s): "TSH", "T4TOTAL", "T3FREE", "THYROIDAB" in the last 72 hours.  Invalid input(s): "FREET3" Anemia work up No results for input(s): "VITAMINB12", "FOLATE", "FERRITIN", "TIBC", "IRON", "RETICCTPCT" in the last 72 hours. Urinalysis    Component Value Date/Time   COLORURINE YELLOW 06/07/2023 1340   APPEARANCEUR HAZY (A) 06/07/2023 1340   LABSPEC 1.008 06/07/2023 1340   PHURINE 7.0 06/07/2023 1340   GLUCOSEU NEGATIVE 06/07/2023 1340   HGBUR NEGATIVE 06/07/2023 1340   BILIRUBINUR NEGATIVE 06/07/2023 1340   KETONESUR NEGATIVE 06/07/2023 1340   PROTEINUR 30 (A) 06/07/2023 1340   UROBILINOGEN 0.2 04/17/2021 1314   NITRITE NEGATIVE 06/07/2023 1340   LEUKOCYTESUR NEGATIVE 06/07/2023 1340   Sepsis Labs Recent Labs  Lab 06/07/23 1340 06/08/23 0644  WBC 6.8 7.5   Microbiology No results found for this or any previous visit (from the past 240 hours).   Time coordinating discharge: Over 30 minutes  SIGNED:   [name] [name], DO  [facility] 06/13/2023, 11:10 AM

## 2023-06-13 NOTE — [place] FL2 (Deleted)
 [place] MEDICAID FL2 LEVEL OF CARE FORM     IDENTIFICATION  Patient Name: [name] Birthdate: March 08, 1930 Sex: female Admission Date (Current Location): 06/07/2023  [facility] and [place] Number:  Producer, television/film/video and Address:  [facility],  [address], [place] [address]      Provider Number: [id number]  Attending Physician Name and Address:  [name], Darlene J, DO  Relative Name and Phone Number:  [name] (Son)  [phone] [facility])    Current Level of Care: Other (Comment) (ALF) Recommended Level of Care: Assisted Living Facility Prior Approval Number:    Date Approved/Denied:   PASRR Number:    Discharge Plan: Other (Comment) (ALF)    Current Diagnoses: Patient Active Problem List   Diagnosis Date Noted   Hyponatremia 06/07/2023   Failure to thrive in adult 06/07/2023   Closed anterior dislocation of left hip (HCC) 04/12/2023   Closed dislocation of left hip, subsequent encounter 04/12/2023   Ulcer of foot, unspecified laterality, limited to breakdown of skin (HCC) 03/23/2023   Osteoarthritis of right glenohumeral joint 02/25/2023   Chronic diastolic CHF (congestive heart failure) (HCC) 12/09/2022   Acute encephalopathy 12/07/2022   Dehydration 12/07/2022   Acute prerenal azotemia 12/07/2022   Leukocytosis 12/07/2022   Chronic hyponatremia 12/07/2022   ILD (interstitial lung disease) (HCC) 10/20/2022   CAP (community acquired pneumonia) 06/12/2022   Chronic heart failure with preserved ejection fraction (HFpEF) (HCC) 04/07/2022   Advanced care planning/counseling discussion 04/07/2022   Chronic venous stasis dermatitis of both lower extremities 04/07/2022   Malaise and fatigue 07/15/2021   Pain of left hip joint 05/20/2021   Primary osteoarthritis 04/20/2021   Inflammatory polyarthropathy (HCC) 04/20/2021   Hyperuricemia 04/20/2021   Generalized weakness 04/20/2021   Chronic kidney disease, stage 3 unspecified (HCC)  04/20/2021   Adjustment disorder with mixed disturbance of emotions and conduct 01/20/2021   Severe recurrent major depression without psychotic features (HCC) 01/19/2021   Pincer nail deformity 12/01/2020   Iron deficiency anemia 03/24/2020   Clavi 02/19/2020   Lumbar radiculopathy 06/17/2019   Pain due to onychomycosis of toenails of both feet 05/14/2019   Mixed incontinence 03/10/2019   Cervical spondylosis 06/07/2018   DDD (degenerative disc disease), cervical 05/20/2018   History of total knee replacement, left 04/13/2018   History of total knee replacement, right 04/13/2018   History of revision of total replacement of left hip joint 04/13/2018   Presence of left artificial knee joint 04/13/2018   Gait abnormality 09/08/2017   Chronic cystitis 07/05/2017   Dysuria 07/05/2017   Anemia of chronic disease 09/22/2015   h/o pericarditis    Essential hypertension    Aortic insufficiency    Posterior vitreous detachment, bilateral 06/05/2015   Left wrist pain 05/16/2015   Triquetral chip fracture, left, closed, initial encounter 05/16/2015   Primary osteoarthritis of first carpometacarpal joint of right hand 02/11/2015   Primary osteoarthritis of right wrist 02/11/2015   Arthropathy 11/25/2014   Multiple skin tears 11/25/2014   Hip dislocation, left (HCC) 12/05/2013   Trochanteric bursitis of left hip 11/24/2012   Aneurysm of thoracic aorta (HCC)    Nonexudative age-related macular degeneration 04/22/2011   Posterior capsular opacification, right 04/22/2011   Status post intraocular lens implant 04/22/2011    Orientation RESPIRATION BLADDER Height & Weight     Self, Time, Situation, Place  O2 (2L) External catheter Weight: 110 lb 0.2 oz (49.9 kg) Height:  5' 4.5" (163.8 cm)  BEHAVIORAL SYMPTOMS/MOOD NEUROLOGICAL BOWEL NUTRITION  STATUS      Continent Diet (regular)  AMBULATORY STATUS COMMUNICATION OF NEEDS Skin   Limited Assist Verbally Normal                        Personal Care Assistance Level of Assistance  Bathing, Feeding, Dressing Bathing Assistance: Limited assistance Feeding assistance: Independent Dressing Assistance: Limited assistance     Functional Limitations Info  Sight, Hearing, Speech Sight Info: Adequate Hearing Info: Adequate Speech Info: Adequate    SPECIAL CARE FACTORS FREQUENCY                       Contractures Contractures Info: Not present    Additional Factors Info  Code Status, Allergies Code Status Info: DNR Allergies Info: Cephalexin, Shellfish-derived Products, Tape, Clindamycin/lincomycin, Lincomycin, Clindamycin Hcl, Fosfomycin, Levofloxacin, Other, Penicillamine, Prednisone, Sugar-protein-starch, Ciprofloxacin, Sulfa Antibiotics, Sulfites           Current Medications (06/13/2023):  This is the current hospital active medication list Current Facility-Administered Medications  Medication Dose Route Frequency Provider Last Rate Last Admin   acetaminophen (TYLENOL) tablet 650 mg  650 mg Oral Q6H PRN Bobette Mo, MD   650 mg at 06/11/23 2019   Or   acetaminophen (TYLENOL) suppository 650 mg  650 mg Rectal Q6H PRN Bobette Mo, MD       allopurinol (ZYLOPRIM) tablet 300 mg  300 mg Oral Daily [name], Darlene J, DO   300 mg at 06/12/23 0818   carvedilol (COREG) tablet 3.125 mg  3.125 mg Oral BID WC Bobette Mo, MD   3.125 mg at 06/13/23 6073   colchicine tablet 0.6 mg  0.6 mg Oral QODAY [name], Darlene Philips, DO   0.6 mg at 06/12/23 0818   diltiazem (CARDIZEM CD) 24 hr capsule 180 mg  180 mg Oral Daily Bobette Mo, MD   180 mg at 06/12/23 7106   doxycycline (VIBRA-TABS) tablet 100 mg  100 mg Oral Q12H [name], Darlene J, DO   100 mg at 06/12/23 2044   ferrous sulfate tablet 325 mg  325 mg Oral Q breakfast [name], Darlene J, DO   325 mg at 06/13/23 0900   hydrALAZINE (APRESOLINE) tablet 10 mg  10 mg Oral Q6H PRN [name], Darlene J, DO   10 mg at 06/12/23 2150   losartan (COZAAR) tablet  100 mg  100 mg Oral Daily [name], Darlene J, DO       melatonin tablet 3 mg  3 mg Oral QHS [name], Darlene Philips, DO   3 mg at 06/12/23 2044   mirtazapine (REMERON) tablet 15 mg  15 mg Oral QHS [name], Darlene J, DO   15 mg at 06/12/23 2044   ondansetron [facility]) tablet 4 mg  4 mg Oral Q6H PRN Bobette Mo, MD       Or   ondansetron [facility]) injection 4 mg  4 mg Intravenous Q6H PRN Bobette Mo, MD       polyethylene glycol [facility] Ethelene Hal) packet 17 g  17 g Oral Daily PRN [name], Darlene J, DO   17 g at 06/12/23 0818   senna-docusate (Senokot-S) tablet 1 tablet  1 tablet Oral BID [name], Darlene J, DO   1 tablet at 06/12/23 2044   sodium chloride tablet 2 g  2 g Oral BID WC [name], Darlene J, DO   2 g at 06/13/23 0900     Discharge Medications: Please see discharge summary for a  list of discharge medications. acetaminophen 500 MG tablet Commonly known as: TYLENOL Take 1,000 mg by mouth every 6 (six) hours as needed (for pain).    allopurinol 300 MG tablet Commonly known as: ZYLOPRIM Take 300 mg by mouth daily.    amoxicillin 500 MG capsule Commonly known as: AMOXIL Take 2,000 mg by mouth See admin instructions. Take 2,000 mg by mouth one hour prior to dental procedures    bisacodyl 5 MG EC tablet Commonly known as: DULCOLAX Take 10 mg by mouth daily as needed (for constipation).    calcium carbonate 500 MG chewable tablet Commonly known as: TUMS - dosed in mg elemental calcium Chew 2 tablets by mouth every 6 (six) hours as needed for indigestion or heartburn (MAX OF 8 TABLETS IN 24 HOURS).    carvedilol 3.125 MG tablet Commonly known as: COREG Take 3.125 mg by mouth 2 (two) times daily with a meal.    CETAPHIL MOISTURIZING EX Apply 1 application  topically See admin instructions. Apply to the body 2 times a day    colchicine 0.6 MG tablet Take 0.6 mg by mouth every other day.    diltiazem 180 MG 24 hr capsule Commonly known as: CARDIZEM CD Take 180 mg by mouth  daily.    FeroSul 325 (65 Fe) MG tablet Generic drug: ferrous sulfate Take 325 mg by mouth daily with breakfast.    furosemide 20 MG tablet Commonly known as: Lasix Take 1 tablet (20 mg total) by mouth daily as needed for edema or fluid (Weight gain of 3 pounds in 1 day or 5 pounds in 1 week). What changed:  when to take this reasons to take this    losartan 100 MG tablet Commonly known as: COZAAR Take 1 tablet (100 mg total) by mouth daily. What changed:  medication strength how much to take    melatonin 3 MG Tabs tablet Take 1 tablet (3 mg total) by mouth at bedtime as needed (insomnia). What changed: when to take this    Minerin Creme Crea Apply 1 Application topically See admin instructions. Apply to lower extremities twice a day What changed: Another medication with the same name was removed. Continue taking this medication, and follow the directions you see here.    mirtazapine 15 MG tablet Commonly known as: REMERON Take 1 tablet (15 mg total) by mouth at bedtime.    NON FORMULARY Take 3 capsules by mouth See admin instructions. Whole produce "FRUIT" and "VEGGIES" capsules- Take 3 capsules of each formulation by mouth in the morning    NON FORMULARY Take 4 oz by mouth See admin instructions. PRUNE JUICE- Drink 4 ounces of prune juice by mouth in the morning    OVER THE COUNTER MEDICATION Take 1 Scoop by mouth See admin instructions. AG1 Athletic Greens packets- Mix the contents of one packet into 8 ounces of water and drink by mouth once a day    OXYGEN Inhale 2 L/min into the lungs at bedtime. 2 L/min at bedtime AND as needed for shortness of breath    sodium chloride 1 g tablet Take 2 tablets (2 g total) by mouth 2 (two) times daily with a meal. What changed:  how much to take when to take this    triamcinolone cream 0.1 % Commonly known as: KENALOG Apply topically 3 (three) times daily. To areas of psoariatic rash. Do not apply to face. What changed:   how much to take when to take this reasons to take this additional  instructions    Vitamin B Complex Tabs Take 1 tablet by mouth daily with breakfast.    Vitamin D3 1000 units Caps Take 1,000 Units by mouth in the morning.    Relevant Imaging Results:  Relevant Lab Results:   Additional Information SSN: [id number]  Ewing Schlein, LCSW

## 2023-06-14 DIAGNOSIS — R2689 Other abnormalities of gait and mobility: Secondary | ICD-10-CM | POA: Diagnosis not present

## 2023-06-14 DIAGNOSIS — M6259 Muscle wasting and atrophy, not elsewhere classified, multiple sites: Secondary | ICD-10-CM | POA: Diagnosis not present

## 2023-06-14 DIAGNOSIS — R2681 Unsteadiness on feet: Secondary | ICD-10-CM | POA: Diagnosis not present

## 2023-06-14 DIAGNOSIS — M25552 Pain in left hip: Secondary | ICD-10-CM | POA: Diagnosis not present

## 2023-06-15 DIAGNOSIS — M25552 Pain in left hip: Secondary | ICD-10-CM | POA: Diagnosis not present

## 2023-06-15 DIAGNOSIS — R2689 Other abnormalities of gait and mobility: Secondary | ICD-10-CM | POA: Diagnosis not present

## 2023-06-15 DIAGNOSIS — M6259 Muscle wasting and atrophy, not elsewhere classified, multiple sites: Secondary | ICD-10-CM | POA: Diagnosis not present

## 2023-06-15 DIAGNOSIS — R2681 Unsteadiness on feet: Secondary | ICD-10-CM | POA: Diagnosis not present

## 2023-06-16 ENCOUNTER — Ambulatory Visit: Admitting: Podiatry

## 2023-06-16 ENCOUNTER — Encounter: Payer: Self-pay | Admitting: Podiatry

## 2023-06-16 DIAGNOSIS — M109 Gout, unspecified: Secondary | ICD-10-CM

## 2023-06-16 DIAGNOSIS — L97521 Non-pressure chronic ulcer of other part of left foot limited to breakdown of skin: Secondary | ICD-10-CM | POA: Diagnosis not present

## 2023-06-17 DIAGNOSIS — M25552 Pain in left hip: Secondary | ICD-10-CM | POA: Diagnosis not present

## 2023-06-17 DIAGNOSIS — R2689 Other abnormalities of gait and mobility: Secondary | ICD-10-CM | POA: Diagnosis not present

## 2023-06-17 DIAGNOSIS — R2681 Unsteadiness on feet: Secondary | ICD-10-CM | POA: Diagnosis not present

## 2023-06-17 DIAGNOSIS — M6259 Muscle wasting and atrophy, not elsewhere classified, multiple sites: Secondary | ICD-10-CM | POA: Diagnosis not present

## 2023-06-19 NOTE — Progress Notes (Signed)
 She presents today from the extended care facility with assistance in a wheelchair.  She states that her toe is doing much better and she was unable to finish the doxycycline and the colchicine because of other treatment plans.  Objective: Vital signs are stable alert oriented x 3.  Second toe on the left foot looks much better as much decrease in size there is no erythema cellulitis drainage or odor to some mild edema associated with it mild tenderness on compression.  No open lesions or wounds are noted on the toe itself.  No signs of infection.  Assessment: Well-healing toe second digit left.  Plan: Since have already discontinued the medication and she is currently doing well at this time I feel that it is okay to just completely discontinue and not restart until symptoms arise.

## 2023-06-20 DIAGNOSIS — R2681 Unsteadiness on feet: Secondary | ICD-10-CM | POA: Diagnosis not present

## 2023-06-20 DIAGNOSIS — D509 Iron deficiency anemia, unspecified: Secondary | ICD-10-CM | POA: Diagnosis not present

## 2023-06-20 DIAGNOSIS — E871 Hypo-osmolality and hyponatremia: Secondary | ICD-10-CM | POA: Diagnosis not present

## 2023-06-20 DIAGNOSIS — F5101 Primary insomnia: Secondary | ICD-10-CM | POA: Diagnosis not present

## 2023-06-20 DIAGNOSIS — R630 Anorexia: Secondary | ICD-10-CM | POA: Diagnosis not present

## 2023-06-20 DIAGNOSIS — M6259 Muscle wasting and atrophy, not elsewhere classified, multiple sites: Secondary | ICD-10-CM | POA: Diagnosis not present

## 2023-06-20 DIAGNOSIS — I509 Heart failure, unspecified: Secondary | ICD-10-CM | POA: Diagnosis not present

## 2023-06-20 DIAGNOSIS — I10 Essential (primary) hypertension: Secondary | ICD-10-CM | POA: Diagnosis not present

## 2023-06-20 DIAGNOSIS — R2689 Other abnormalities of gait and mobility: Secondary | ICD-10-CM | POA: Diagnosis not present

## 2023-06-20 DIAGNOSIS — I130 Hypertensive heart and chronic kidney disease with heart failure and stage 1 through stage 4 chronic kidney disease, or unspecified chronic kidney disease: Secondary | ICD-10-CM | POA: Diagnosis not present

## 2023-06-20 DIAGNOSIS — K219 Gastro-esophageal reflux disease without esophagitis: Secondary | ICD-10-CM | POA: Diagnosis not present

## 2023-06-20 DIAGNOSIS — M109 Gout, unspecified: Secondary | ICD-10-CM | POA: Diagnosis not present

## 2023-06-20 DIAGNOSIS — E559 Vitamin D deficiency, unspecified: Secondary | ICD-10-CM | POA: Diagnosis not present

## 2023-06-20 DIAGNOSIS — M25552 Pain in left hip: Secondary | ICD-10-CM | POA: Diagnosis not present

## 2023-06-21 DIAGNOSIS — R2689 Other abnormalities of gait and mobility: Secondary | ICD-10-CM | POA: Diagnosis not present

## 2023-06-21 DIAGNOSIS — M25552 Pain in left hip: Secondary | ICD-10-CM | POA: Diagnosis not present

## 2023-06-21 DIAGNOSIS — R2681 Unsteadiness on feet: Secondary | ICD-10-CM | POA: Diagnosis not present

## 2023-06-21 DIAGNOSIS — M6259 Muscle wasting and atrophy, not elsewhere classified, multiple sites: Secondary | ICD-10-CM | POA: Diagnosis not present

## 2023-06-24 DIAGNOSIS — M25552 Pain in left hip: Secondary | ICD-10-CM | POA: Diagnosis not present

## 2023-06-24 DIAGNOSIS — R2689 Other abnormalities of gait and mobility: Secondary | ICD-10-CM | POA: Diagnosis not present

## 2023-06-24 DIAGNOSIS — R2681 Unsteadiness on feet: Secondary | ICD-10-CM | POA: Diagnosis not present

## 2023-06-24 DIAGNOSIS — M6259 Muscle wasting and atrophy, not elsewhere classified, multiple sites: Secondary | ICD-10-CM | POA: Diagnosis not present

## 2023-06-27 ENCOUNTER — Encounter (HOSPITAL_COMMUNITY): Payer: Self-pay | Admitting: Emergency Medicine

## 2023-06-27 ENCOUNTER — Other Ambulatory Visit: Payer: Self-pay

## 2023-06-27 ENCOUNTER — Inpatient Hospital Stay (HOSPITAL_COMMUNITY)
Admission: EM | Admit: 2023-06-27 | Discharge: 2023-06-29 | DRG: 197 | Disposition: A | Discharge status: Nursing Home (Includes ICF) | Source: Skilled Nursing Facility | Attending: Internal Medicine | Admitting: Internal Medicine

## 2023-06-27 ENCOUNTER — Emergency Department (HOSPITAL_COMMUNITY)

## 2023-06-27 DIAGNOSIS — M25552 Pain in left hip: Secondary | ICD-10-CM | POA: Diagnosis not present

## 2023-06-27 DIAGNOSIS — I161 Hypertensive emergency: Secondary | ICD-10-CM

## 2023-06-27 DIAGNOSIS — Z85828 Personal history of other malignant neoplasm of skin: Secondary | ICD-10-CM

## 2023-06-27 DIAGNOSIS — M109 Gout, unspecified: Secondary | ICD-10-CM | POA: Diagnosis present

## 2023-06-27 DIAGNOSIS — Z66 Do not resuscitate: Secondary | ICD-10-CM | POA: Diagnosis not present

## 2023-06-27 DIAGNOSIS — I10 Essential (primary) hypertension: Secondary | ICD-10-CM | POA: Diagnosis not present

## 2023-06-27 DIAGNOSIS — Z90721 Acquired absence of ovaries, unilateral: Secondary | ICD-10-CM | POA: Diagnosis not present

## 2023-06-27 DIAGNOSIS — Z8739 Personal history of other diseases of the musculoskeletal system and connective tissue: Secondary | ICD-10-CM | POA: Diagnosis not present

## 2023-06-27 DIAGNOSIS — I5032 Chronic diastolic (congestive) heart failure: Secondary | ICD-10-CM | POA: Diagnosis not present

## 2023-06-27 DIAGNOSIS — Z538 Procedure and treatment not carried out for other reasons: Secondary | ICD-10-CM | POA: Diagnosis present

## 2023-06-27 DIAGNOSIS — Z79899 Other long term (current) drug therapy: Secondary | ICD-10-CM

## 2023-06-27 DIAGNOSIS — R1319 Other dysphagia: Secondary | ICD-10-CM

## 2023-06-27 DIAGNOSIS — J849 Interstitial pulmonary disease, unspecified: Principal | ICD-10-CM | POA: Diagnosis present

## 2023-06-27 DIAGNOSIS — J9611 Chronic respiratory failure with hypoxia: Secondary | ICD-10-CM | POA: Insufficient documentation

## 2023-06-27 DIAGNOSIS — I712 Thoracic aortic aneurysm, without rupture, unspecified: Secondary | ICD-10-CM | POA: Diagnosis present

## 2023-06-27 DIAGNOSIS — Z91013 Allergy to seafood: Secondary | ICD-10-CM

## 2023-06-27 DIAGNOSIS — Z9981 Dependence on supplemental oxygen: Secondary | ICD-10-CM

## 2023-06-27 DIAGNOSIS — R079 Chest pain, unspecified: Secondary | ICD-10-CM | POA: Diagnosis not present

## 2023-06-27 DIAGNOSIS — M8588 Other specified disorders of bone density and structure, other site: Secondary | ICD-10-CM | POA: Diagnosis not present

## 2023-06-27 DIAGNOSIS — D62 Acute posthemorrhagic anemia: Secondary | ICD-10-CM | POA: Diagnosis not present

## 2023-06-27 DIAGNOSIS — Z515 Encounter for palliative care: Secondary | ICD-10-CM | POA: Diagnosis not present

## 2023-06-27 DIAGNOSIS — E871 Hypo-osmolality and hyponatremia: Secondary | ICD-10-CM | POA: Diagnosis present

## 2023-06-27 DIAGNOSIS — R0902 Hypoxemia: Secondary | ICD-10-CM | POA: Diagnosis not present

## 2023-06-27 DIAGNOSIS — Z882 Allergy status to sulfonamides status: Secondary | ICD-10-CM

## 2023-06-27 DIAGNOSIS — Z888 Allergy status to other drugs, medicaments and biological substances status: Secondary | ICD-10-CM

## 2023-06-27 DIAGNOSIS — R2681 Unsteadiness on feet: Secondary | ICD-10-CM | POA: Diagnosis not present

## 2023-06-27 DIAGNOSIS — N189 Chronic kidney disease, unspecified: Secondary | ICD-10-CM | POA: Diagnosis not present

## 2023-06-27 DIAGNOSIS — K219 Gastro-esophageal reflux disease without esophagitis: Secondary | ICD-10-CM | POA: Diagnosis present

## 2023-06-27 DIAGNOSIS — N182 Chronic kidney disease, stage 2 (mild): Secondary | ICD-10-CM | POA: Diagnosis present

## 2023-06-27 DIAGNOSIS — I130 Hypertensive heart and chronic kidney disease with heart failure and stage 1 through stage 4 chronic kidney disease, or unspecified chronic kidney disease: Secondary | ICD-10-CM | POA: Diagnosis present

## 2023-06-27 DIAGNOSIS — R0789 Other chest pain: Secondary | ICD-10-CM | POA: Diagnosis not present

## 2023-06-27 DIAGNOSIS — R627 Adult failure to thrive: Secondary | ICD-10-CM | POA: Diagnosis present

## 2023-06-27 DIAGNOSIS — I351 Nonrheumatic aortic (valve) insufficiency: Secondary | ICD-10-CM | POA: Diagnosis present

## 2023-06-27 DIAGNOSIS — D649 Anemia, unspecified: Secondary | ICD-10-CM | POA: Diagnosis not present

## 2023-06-27 DIAGNOSIS — Z96642 Presence of left artificial hip joint: Secondary | ICD-10-CM | POA: Diagnosis present

## 2023-06-27 DIAGNOSIS — Z90710 Acquired absence of both cervix and uterus: Secondary | ICD-10-CM

## 2023-06-27 DIAGNOSIS — Z7401 Bed confinement status: Secondary | ICD-10-CM | POA: Diagnosis not present

## 2023-06-27 DIAGNOSIS — R5381 Other malaise: Secondary | ICD-10-CM | POA: Diagnosis present

## 2023-06-27 DIAGNOSIS — N179 Acute kidney failure, unspecified: Secondary | ICD-10-CM | POA: Insufficient documentation

## 2023-06-27 DIAGNOSIS — Z8249 Family history of ischemic heart disease and other diseases of the circulatory system: Secondary | ICD-10-CM

## 2023-06-27 DIAGNOSIS — K921 Melena: Secondary | ICD-10-CM | POA: Diagnosis not present

## 2023-06-27 DIAGNOSIS — F32A Depression, unspecified: Secondary | ICD-10-CM | POA: Diagnosis not present

## 2023-06-27 DIAGNOSIS — R1310 Dysphagia, unspecified: Secondary | ICD-10-CM | POA: Diagnosis not present

## 2023-06-27 DIAGNOSIS — R9431 Abnormal electrocardiogram [ECG] [EKG]: Secondary | ICD-10-CM | POA: Diagnosis not present

## 2023-06-27 DIAGNOSIS — R52 Pain, unspecified: Secondary | ICD-10-CM | POA: Diagnosis not present

## 2023-06-27 DIAGNOSIS — J984 Other disorders of lung: Secondary | ICD-10-CM | POA: Diagnosis not present

## 2023-06-27 DIAGNOSIS — Z96653 Presence of artificial knee joint, bilateral: Secondary | ICD-10-CM | POA: Diagnosis not present

## 2023-06-27 DIAGNOSIS — Z881 Allergy status to other antibiotic agents status: Secondary | ICD-10-CM

## 2023-06-27 DIAGNOSIS — R0602 Shortness of breath: Secondary | ICD-10-CM | POA: Diagnosis not present

## 2023-06-27 DIAGNOSIS — M1811 Unilateral primary osteoarthritis of first carpometacarpal joint, right hand: Secondary | ICD-10-CM | POA: Diagnosis not present

## 2023-06-27 DIAGNOSIS — D631 Anemia in chronic kidney disease: Secondary | ICD-10-CM | POA: Diagnosis not present

## 2023-06-27 DIAGNOSIS — M6259 Muscle wasting and atrophy, not elsewhere classified, multiple sites: Secondary | ICD-10-CM | POA: Diagnosis not present

## 2023-06-27 DIAGNOSIS — R531 Weakness: Secondary | ICD-10-CM | POA: Diagnosis not present

## 2023-06-27 DIAGNOSIS — R2689 Other abnormalities of gait and mobility: Secondary | ICD-10-CM | POA: Diagnosis not present

## 2023-06-27 DIAGNOSIS — Z823 Family history of stroke: Secondary | ICD-10-CM

## 2023-06-27 DIAGNOSIS — Z87891 Personal history of nicotine dependence: Secondary | ICD-10-CM | POA: Diagnosis not present

## 2023-06-27 LAB — COMPREHENSIVE METABOLIC PANEL WITH GFR
ALT: 19 U/L (ref 0–44)
AST: 32 U/L (ref 15–41)
Albumin: 2.8 g/dL — ABNORMAL LOW (ref 3.5–5.0)
Alkaline Phosphatase: 64 U/L (ref 38–126)
Anion gap: 5 (ref 5–15)
BUN: 30 mg/dL — ABNORMAL HIGH (ref 8–23)
CO2: 24 mmol/L (ref 22–32)
Calcium: 8.4 mg/dL — ABNORMAL LOW (ref 8.9–10.3)
Chloride: 104 mmol/L (ref 98–111)
Creatinine, Ser: 1.01 mg/dL — ABNORMAL HIGH (ref 0.44–1.00)
GFR, Estimated: 53 mL/min — ABNORMAL LOW (ref >60)
Glucose, Bld: 95 mg/dL (ref 70–99)
Potassium: 4.9 mmol/L (ref 3.5–5.1)
Sodium: 133 mmol/L — ABNORMAL LOW (ref 135–145)
Total Bilirubin: 0.4 mg/dL (ref 0.0–1.2)
Total Protein: 6.7 g/dL (ref 6.5–8.1)

## 2023-06-27 LAB — CBC WITH DIFFERENTIAL/PLATELET
Abs Immature Granulocytes: 0.03 10*3/uL (ref 0.00–0.07)
Basophils Absolute: 0.2 10*3/uL — ABNORMAL HIGH (ref 0.0–0.1)
Basophils Relative: 2 %
Eosinophils Absolute: 0.4 10*3/uL (ref 0.0–0.5)
Eosinophils Relative: 6 %
HCT: 24.8 % — ABNORMAL LOW (ref 36.0–46.0)
Hemoglobin: 7.6 g/dL — ABNORMAL LOW (ref 12.0–15.0)
Immature Granulocytes: 1 %
Lymphocytes Relative: 22 %
Lymphs Abs: 1.5 10*3/uL (ref 0.7–4.0)
MCH: 26 pg (ref 26.0–34.0)
MCHC: 30.6 g/dL (ref 30.0–36.0)
MCV: 84.9 fL (ref 80.0–100.0)
Monocytes Absolute: 0.8 10*3/uL (ref 0.1–1.0)
Monocytes Relative: 13 %
Neutro Abs: 3.7 10*3/uL (ref 1.7–7.7)
Neutrophils Relative %: 56 %
Platelets: 306 10*3/uL (ref 150–400)
RBC: 2.92 MIL/uL — ABNORMAL LOW (ref 3.87–5.11)
RDW: 18.7 % — ABNORMAL HIGH (ref 11.5–15.5)
WBC: 6.5 10*3/uL (ref 4.0–10.5)
nRBC: 0 % (ref 0.0–0.2)

## 2023-06-27 LAB — RESP PANEL BY RT-PCR (RSV, FLU A&B, COVID)  RVPGX2
Influenza A by PCR: NEGATIVE
Influenza B by PCR: NEGATIVE
Resp Syncytial Virus by PCR: NEGATIVE
SARS Coronavirus 2 by RT PCR: NEGATIVE

## 2023-06-27 LAB — BRAIN NATRIURETIC PEPTIDE: B Natriuretic Peptide: 73.5 pg/mL (ref 0.0–100.0)

## 2023-06-27 LAB — TROPONIN I (HIGH SENSITIVITY): Troponin I (High Sensitivity): 13 ng/L (ref <18)

## 2023-06-27 LAB — POC OCCULT BLOOD, ED: Fecal Occult Bld: NEGATIVE

## 2023-06-27 MED ORDER — SODIUM CHLORIDE 0.9% IV SOLUTION: Freq: Once | INTRAVENOUS | Status: DC

## 2023-06-27 NOTE — ED Provider Notes (Signed)
 [place] EMERGENCY DEPARTMENT AT [facility] Provider Note   CSN: [id number] Arrival date & time: 06/27/23  2106     History  No chief complaint on file.  HPI [name] is a 89 y.o. female with past medical history significant for HTN, anemia of chronic medical disease/iron deficiency, gout, chronic hyponatremia with baseline sodium 130-134, chronic respiratory failure on 2 L nasal cannula presents due to shortness of breath.  Patient states that today, she noted she felt more short of breath than usual.  On 2 L nasal cannula at baseline.  She denies any chest pain, lower extremity swelling, wheezing, fevers, or cough.  She endorses maybe 1 episode of melena in the last week but no other episodes of melena or hematochezia.  Does not take blood thinners. With EMS, patient was given 1 nitro tab due to high blood pressure, and placed on nonrebreather due to shortness of breath and rales.  She was placed on 2 L nasal cannula upon arrival.     Home Medications Prior to Admission medications   Medication Sig Start Date End Date Taking? Authorizing Provider  acetaminophen (TYLENOL) 500 MG tablet Take 1,000 mg by mouth every 6 (six) hours as needed (for pain).    [provider]  allopurinol (ZYLOPRIM) 300 MG tablet Take 300 mg by mouth daily.    [provider]  amoxicillin (AMOXIL) 500 MG capsule Take 2,000 mg by mouth See admin instructions. Take 2,000 mg by mouth one hour prior to dental procedures    [provider]  B Complex Vitamins (VITAMIN B COMPLEX) TABS Take 1 tablet by mouth daily with breakfast.    [provider]  bisacodyl (DULCOLAX) 5 MG EC tablet Take 10 mg by mouth daily as needed (for constipation).    [provider]  calcium carbonate (TUMS - DOSED IN MG ELEMENTAL CALCIUM) 500 MG chewable tablet Chew 2 tablets by mouth every 6 (six) hours as needed for indigestion or heartburn (MAX OF 8 TABLETS IN 24 HOURS).     [provider]  carvedilol (COREG) 3.125 MG tablet Take 3.125 mg by mouth 2 (two) times daily with a meal.    [provider]  Cholecalciferol (VITAMIN D3) 1000 units CAPS Take 1,000 Units by mouth in the morning.    [provider]  colchicine 0.6 MG tablet Take 0.6 mg by mouth every other day.    [provider]  diltiazem (CARDIZEM CD) 180 MG 24 hr capsule Take 180 mg by mouth daily.    [provider]  Emollient (CETAPHIL MOISTURIZING EX) Apply 1 application  topically See admin instructions. Apply to the body 2 times a day    [provider]  FEROSUL 325 (65 Fe) MG tablet Take 325 mg by mouth daily with breakfast.    [provider]  furosemide (LASIX) 20 MG tablet Take 1 tablet (20 mg total) by mouth daily as needed for edema or fluid (Weight gain of 3 pounds in 1 day or 5 pounds in 1 week). 06/13/23 06/12/24  [name], [name], DO  losartan (COZAAR) 100 MG tablet Take 1 tablet (100 mg total) by mouth daily. 06/13/23 09/11/23  [name], [name], DO  melatonin 3 MG TABS tablet Take 1 tablet (3 mg total) by mouth at bedtime as needed (insomnia). Patient taking differently: Take 3 mg by mouth at bedtime. 12/10/22   Rodolph Bong, MD  mirtazapine (REMERON) 15 MG tablet Take 1 tablet (15 mg  total) by mouth at bedtime. 06/13/23 09/11/23  [name], [name], DO  NON FORMULARY Take 3 capsules by mouth See admin instructions. Whole produce "FRUIT" and "VEGGIES" capsules- Take 3 capsules of each formulation by mouth in the morning    [provider]  NON FORMULARY Take 4 oz by mouth See admin instructions. PRUNE JUICE- Drink 4 ounces of prune juice by mouth in the morning    [provider]  OVER THE COUNTER MEDICATION Take 1 Scoop by mouth See admin instructions. AG1 Athletic Greens packets- Mix the contents of one packet into 8 ounces of water and drink by mouth once a day    [provider]  OXYGEN Inhale 2 L/min into the  lungs at bedtime. 2 L/min at bedtime AND as needed for shortness of breath    [provider]  Skin Protectants, Misc. (MINERIN CREME) CREA Apply 1 Application topically See admin instructions. Apply to lower extremities twice a day    [provider]  sodium chloride 1 g tablet Take 2 tablets (2 g total) by mouth 2 (two) times daily with a meal. 06/13/23 09/11/23  [name], [name], DO  triamcinolone cream (KENALOG) 0.1 % Apply topically 3 (three) times daily. To areas of psoariatic rash. Do not apply to face. Patient taking differently: Apply 1 Application topically 2 (two) times daily as needed (for itching- affected areas of back, hands, chest, and legs). 10/25/22   Johney Maine, MD      Allergies    Cephalexin, Shellfish-derived products, Tape, Clindamycin/lincomycin, Lincomycin, Clindamycin hcl, Fosfomycin, Levofloxacin, Other, Penicillamine, Prednisone, Sugar-protein-starch, Ciprofloxacin, Sulfa antibiotics, and Sulfites    Review of Systems   Review of Systems  Physical Exam Updated Vital Signs BP (!) 158/78   Pulse 77   Temp 98.2 F (36.8 C) (Oral)   Resp 20   SpO2 100%  Physical Exam Vitals and nursing note reviewed.  Constitutional:      General: She is not in acute distress.    Appearance: She is well-developed.  HENT:     Head: Normocephalic and atraumatic.  Eyes:     Conjunctiva/sclera: Conjunctivae normal.  Cardiovascular:     Rate and Rhythm: Normal rate and regular rhythm.     Heart sounds: No murmur heard. Pulmonary:     Effort: Pulmonary effort is normal. No respiratory distress.     Breath sounds: Normal breath sounds. No rales.  Abdominal:     General: Abdomen is flat.     Palpations: Abdomen is soft.     Tenderness: There is no abdominal tenderness. There is no guarding or rebound.  Musculoskeletal:        General: No swelling.     Cervical back: Neck supple.     Comments: Trace, symmetric bilateral pedal edema  Skin:    General:  Skin is warm and dry.     Capillary Refill: Capillary refill takes less than 2 seconds.  Neurological:     Mental Status: She is alert.  Psychiatric:        Mood and Affect: Mood normal.     ED Results / Procedures / Treatments   Labs (all labs ordered are listed, but only abnormal results are displayed) Labs Reviewed  CBC WITH DIFFERENTIAL/PLATELET - Abnormal; Notable for the following components:      Result Value   RBC 2.92 (*)    Hemoglobin 7.6 (*)    HCT 24.8 (*)    RDW 18.7 (*)    Basophils  Absolute 0.2 (*)    All other components within normal limits  COMPREHENSIVE METABOLIC PANEL WITH GFR - Abnormal; Notable for the following components:   Sodium 133 (*)    BUN 30 (*)    Creatinine, Ser 1.01 (*)    Calcium 8.4 (*)    Albumin 2.8 (*)    GFR, Estimated 53 (*)    All other components within normal limits  RESP PANEL BY RT-PCR (RSV, FLU A&B, COVID)  RVPGX2  BRAIN NATRIURETIC PEPTIDE  POC OCCULT BLOOD, ED  PREPARE RBC (CROSSMATCH)  TYPE AND SCREEN  TROPONIN I (HIGH SENSITIVITY)  TROPONIN I (HIGH SENSITIVITY)    EKG EKG Interpretation Date/Time:  Monday June 27 2023 21:15:51 EDT Ventricular Rate:  77 PR Interval:  194 QRS Duration:  86 QT Interval:  370 QTC Calculation: 419 R Axis:   89  Text Interpretation: Sinus rhythm Left atrial enlargement Left ventricular hypertrophy Anterior infarct, old similar to previous Confirmed by Coralee Pesa [phone]) on 06/27/2023 9:29:16 PM  Radiology DG Chest Portable 1 View Result Date: 06/27/2023 CLINICAL DATA:  Shortness of breath EXAM: PORTABLE CHEST 1 VIEW COMPARISON:  06/07/2023 FINDINGS: Heart and mediastinal contours within normal limits. Chronic reticulonodular densities throughout the lungs compatible with chronic lung disease, unchanged. No definite acute process. No effusions or acute bony abnormality. IMPRESSION: Stable diffuse chronic lung disease.  No active disease. Electronically Signed   By: Charlett Nose M.D.    On: 06/27/2023 22:25    Procedures Procedures    Medications Ordered in ED Medications  0.9 %  sodium chloride infusion (Manually program via Guardrails IV Fluids) (has no administration in time range)    ED Course/ Medical Decision Making/ A&P                                 Medical Decision Making Amount and/or Complexity of Data Reviewed Labs: ordered. Radiology: ordered.  Risk Prescription drug management.   Patient is alert, afebrile, hemodynamically stable in no acute distress.  Physical exam as noted above, with left-sided rales noted.  Patient is on 2 L nasal cannula, her home oxygen requirement.  Differential includes flash pulmonary edema, CHF exacerbation, pneumonia, ACS, anemia, amongst other diagnoses.  Workup resulted with no leukocytosis, hemoglobin 7.6 (over the last 5 months, hemoglobin 8-9, was ~7 6 months ago, during which she received 2 blood transfusions).  Troponin 13, BNP 73.  Hemoccult negative.  I personally interpreted patient's chest x-ray, which demonstrated no new focal consolidations.  I personally interpreted patient's EKG, which demonstrated sinus rhythm with a rate of 77, no interval abnormalities, no acute ischemic changes.  Attempted to ambulate the patient, and she became very tachypneic and unable to speak in full sentences.  Oxygen saturation remained in the high 90s, heart rate increased from 70s to 90s.  Given two point hemoglobin drop that appears to be symptomatic, will transfuse with 1 unit of blood and admit for further monitoring and management. Handoff given to admitting team.        Final Clinical Impression(s) / ED Diagnoses Final diagnoses:  Shortness of breath    Rx / DC Orders ED Discharge Orders     None         Barrett Shell, MD 06/27/23 2356    Rozelle Logan, DO 06/30/23 1622

## 2023-06-27 NOTE — H&P (Incomplete)
 History and Physical    [name] UJW:[id number] DOB: 15-Dec-1929 DOA: 06/27/2023  PCP: Emilio Aspen, MD   Patient coming from: Home   Chief Complaint: Shortness of breath ED TRIAGE note:  BIBA Per EMS: Pt coming from [facility] w/ c/o SHOB. 2L [place] at baseline - put on NRB due to [facility] and rales.  Rales in Lower lobes.  1 nitroglycerin given en route  179/100 initial BP  142/70 18G L arm  Hx CHF     HPI:  [name] is a 89 y.o. female with medical history significant of chronic interstitial lung disease on 2 L oxygen at baseline, aortic insufficiency, chronic CHF, aneurysm of the thoracic aorta, chronic hyponatremia, CKD stage II, essential hypertension, anemia of chronic disease, gout, dysphagia, chronic hypoxic respiratory failure 2 L oxygen at baseline, chronic physical deconditioning/debility and weakness due to advanced age and grade 2 diastolic heart failure with preserved EF 60 to 65% presenting to the emergency department complaining of shortness of breath. Patient is stating that she has been having worsening dyspnea than usual even though using oxygen 2 L at baseline. Patient denies any pain, lower extremity edema, wheezing, fever and cough. Patient reported 1 episode of melena last week.  En route to ED patient has been given 1 mg of nitro tablet in the setting of high blood pressure and placed on nonrebreather due to shortness of breath and rales.  Currently patient is on 2 L oxygen O2 sat 100%.   ED Course:  At presentation to ED blood pressure was 179/110 which has been improved to 158/78.  Heart rate 77, O2 sat 100% on 2 L oxygen.  Chest x-ray showing stable chronic lung disease.  No active disease process.  CBC showing hemoglobin drop 7.6.  Baseline hemoglobin around 8-9.  Normal WBC and platelet count. CMP low sodium 133, elevated creatinine 1.01, low albumin 2.6 and low GFR 53.  Normal troponin level. Normal BNP 73.5. Respiratory panel  unremarkable. EKG showing normal sinus rhythm heart rate 77.  Left atrial and ventricular enlargement.  In the ED 1 unit of blood transfusion has been ordered.  Hospitalist has been consulted for further evaluation management of symptomatic anemia, melena and acute kidney injury.  Significant labs in the ED:  Lab Orders         Resp panel by RT-PCR (RSV, Flu A&B, Covid) Anterior Nasal Swab         CBC with Differential         Comprehensive metabolic panel         Brain natriuretic peptide         POC occult blood, ED       Review of Systems:  ROS  Past Medical History:  Diagnosis Date  . Anemia    CHRONIC  . Aortic insufficiency    a. 2D echo 08/29/15: EF 55-60%, mild LVH, diastolic dysfunction, elevated LV filling pressure, mild AI, severe LAE, mild RAE, mild TR, dilated descending thoracic aorta just distal to the takeoff of the left subclavian, measuring 4.1 cm, no pericardial effusion.  . Arthritis    OA-SOME BACK AND NECK PAIN,  GOES TO CHIROPRACTOR TWICE A MONTH;  HX OF JOINT REPLACEMENTS   . Cancer (HCC)    SKIN CANCERS REMOVED FROM LEGS  . CKD (chronic kidney disease), stage II   . Depression   . Dyspnea   . Essential hypertension   . Gait abnormality 09/08/2017  . GERD (gastroesophageal reflux disease)   .  Low sodium levels   . Myocarditis (HCC)   . PSVT (paroxysmal supraventricular tachycardia) (HCC)   . Recurrent Pericarditis    RECURRENT   . Thoracic aneurysm    a. Followed by [facility] -last seen in 12/2013 w/ plan for f/u MRA and thoracic surgery f/u in 2 yrs, but patient does not wish to follow any longer.    Past Surgical History:  Procedure Laterality Date  . APPENDECTOMY    . BREAST EXCISIONAL BIOPSY Left 1992   benign  . EXCISION/RELEASE BURSA HIP Left 11/24/2012   Procedure: LEFT HIP BURSECTOMY AND TENDON REPAIR ;  Surgeon: Loanne Drilling, MD;  Location: [facility];  Service: Orthopedics;  Laterality: Left;  . HIP CLOSED REDUCTION Left 12/19/2012    Procedure: CLOSED MANIPULATION HIP;  Surgeon: Shelda Pal, MD;  Location: [facility];  Service: Orthopedics;  Laterality: Left;  . HIP CLOSED REDUCTION Left 12/05/2013   Procedure: CLOSED MANIPULATION HIP;  Surgeon: Verlee Rossetti, MD;  Location: [facility];  Service: Orthopedics;  Laterality: Left;  . HIP CLOSED REDUCTION Left 09/22/2015   Procedure: CLOSED REDUCTION HIP;  Surgeon: Venita Lick, MD;  Location: [facility];  Service: Orthopedics;  Laterality: Left;  . HIP CLOSED REDUCTION Left 12/05/2022   Procedure: CLOSED MANIPULATION HIP;  Surgeon: Ollen Gross, MD;  Location: [facility];  Service: Orthopedics;  Laterality: Left;  . HIP CLOSED REDUCTION Left 04/12/2023   Procedure: CLOSED MANIPULATION HIP;  Surgeon: Toni Arthurs, MD;  Location: [facility];  Service: Orthopedics;  Laterality: Left;  . JOINT REPLACEMENT     LEFT TOTAL HIP REPLACEMENT AND REVISIONS X 2  . OOPHORECTOMY     has a partial of one ovary remaingin  . PELVIC LAPAROSCOPY     ovarian cyst removal,   . REVISION TOTAL HIP ARTHROPLASTY     left  . right Achilles Tendon repair  5/13  . TOTAL KNEE ARTHROPLASTY     right  . TOTAL KNEE ARTHROPLASTY     left  . VAGINAL HYSTERECTOMY     with ovarian cyst removal     reports that she quit smoking about 60 years ago. Her smoking use included cigarettes. She started smoking about 65 years ago. She has never used smokeless tobacco. She reports that she does not currently use drugs. She reports that she does not drink alcohol.  Allergies  Allergen Reactions  . Cephalexin Other (See Comments)    Pt states this caused eye problems and STROKE-LIKE SYMPTOMS!!  . Shellfish-Derived Products Hives and Rash  . Tape Other (See Comments)    Please use paper tape or Coban Wrap; patient's skin tears VERY easily!!  . Clindamycin/Lincomycin Rash  . Lincomycin Rash  . Clindamycin Hcl Other (See Comments)    "Per pharmacist"  . Fosfomycin Other (See Comments)    Disorientation  . Levofloxacin Other  (See Comments)    Reaction not recalled by the patient  . Other Other (See Comments)    "Steroids" = "Allergic," per MAR  . Penicillamine Other (See Comments)    Reaction not recalled by the patient  . Prednisone Other (See Comments)    "Allergic," per MAR  . Sugar-Protein-Starch Other (See Comments)    Sugary foods cause legs to cramp  . Ciprofloxacin Rash  . Sulfa Antibiotics Hives and Rash  . Sulfites Hives, Swelling and Rash    Family History  Problem Relation Age of Onset  . Hypertension Mother   . Stroke Mother   . Heart attack  Father 50  . Heart failure Father   . Heart failure Sister   . Aortic aneurysm Sister   . Aortic aneurysm Sister   . Stroke Sister   . Pneumonia Brother   . Stroke Brother   . ALS Other   . Hyperlipidemia Daughter   . Aortic aneurysm Son   . Breast cancer Neg Hx     Prior to Admission medications   Medication Sig Start Date End Date Taking? Authorizing Provider  acetaminophen (TYLENOL) 500 MG tablet Take 1,000 mg by mouth every 6 (six) hours as needed (for pain).    [provider]  allopurinol (ZYLOPRIM) 300 MG tablet Take 300 mg by mouth daily.    [provider]  amoxicillin (AMOXIL) 500 MG capsule Take 2,000 mg by mouth See admin instructions. Take 2,000 mg by mouth one hour prior to dental procedures    [provider]  B Complex Vitamins (VITAMIN B COMPLEX) TABS Take 1 tablet by mouth daily with breakfast.    [provider]  bisacodyl (DULCOLAX) 5 MG EC tablet Take 10 mg by mouth daily as needed (for constipation).    [provider]  calcium carbonate (TUMS - DOSED IN MG ELEMENTAL CALCIUM) 500 MG chewable tablet Chew 2 tablets by mouth every 6 (six) hours as needed for indigestion or heartburn (MAX OF 8 TABLETS IN 24 HOURS).    [provider]  carvedilol (COREG) 3.125 MG tablet Take 3.125 mg by mouth 2 (two) times daily with a meal.    [provider]  Cholecalciferol  (VITAMIN D3) 1000 units CAPS Take 1,000 Units by mouth in the morning.    [provider]  colchicine 0.6 MG tablet Take 0.6 mg by mouth every other day.    [provider]  diltiazem (CARDIZEM CD) 180 MG 24 hr capsule Take 180 mg by mouth daily.    [provider]  Emollient (CETAPHIL MOISTURIZING EX) Apply 1 application  topically See admin instructions. Apply to the body 2 times a day    [provider]  FEROSUL 325 (65 Fe) MG tablet Take 325 mg by mouth daily with breakfast.    [provider]  furosemide (LASIX) 20 MG tablet Take 1 tablet (20 mg total) by mouth daily as needed for edema or fluid (Weight gain of 3 pounds in 1 day or 5 pounds in 1 week). 06/13/23 06/12/24  [name], [name], DO  losartan (COZAAR) 100 MG tablet Take 1 tablet (100 mg total) by mouth daily. 06/13/23 09/11/23  [name], [name], DO  melatonin 3 MG TABS tablet Take 1 tablet (3 mg total) by mouth at bedtime as needed (insomnia). Patient taking differently: Take 3 mg by mouth at bedtime. 12/10/22   Rodolph Bong, MD  mirtazapine (REMERON) 15 MG tablet Take 1 tablet (15 mg total) by mouth at bedtime. 06/13/23 09/11/23  [name], [name], DO  NON FORMULARY Take 3 capsules by mouth See admin instructions. Whole produce "FRUIT" and "VEGGIES" capsules- Take 3 capsules of each formulation by mouth in the morning    [provider]  NON FORMULARY Take 4 oz by mouth See admin instructions. PRUNE JUICE- Drink 4 ounces of prune juice by mouth in the morning    [provider]  OVER THE COUNTER MEDICATION Take 1 Scoop by mouth See admin instructions. AG1 Athletic Greens packets- Mix the contents of one packet into 8 ounces of water and drink by mouth once a day  [provider]  OXYGEN Inhale 2 L/min into the lungs at bedtime. 2 L/min at bedtime AND as needed for shortness of breath    [provider]  Skin Protectants, Misc. (MINERIN CREME) CREA Apply 1  Application topically See admin instructions. Apply to lower extremities twice a day    [provider]  sodium chloride 1 g tablet Take 2 tablets (2 g total) by mouth 2 (two) times daily with a meal. 06/13/23 09/11/23  [name], [name], DO  triamcinolone cream (KENALOG) 0.1 % Apply topically 3 (three) times daily. To areas of psoariatic rash. Do not apply to face. Patient taking differently: Apply 1 Application topically 2 (two) times daily as needed (for itching- affected areas of back, hands, chest, and legs). 10/25/22   Johney Maine, MD     Physical Exam: Vitals:   06/27/23 2110  BP: (!) 158/78  Pulse: 77  Resp: 20  Temp: 98.2 F (36.8 C)  TempSrc: Oral  SpO2: 100%    Physical Exam   Labs on Admission: I have personally reviewed following labs and imaging studies  CBC: Recent Labs  Lab 06/27/23 2123  WBC 6.5  NEUTROABS 3.7  HGB 7.6*  HCT 24.8*  MCV 84.9  PLT 306   Basic Metabolic Panel: Recent Labs  Lab 06/27/23 2123  NA 133*  K 4.9  CL 104  CO2 24  GLUCOSE 95  BUN 30*  CREATININE 1.01*  CALCIUM 8.4*   GFR: CrCl cannot be calculated (Unknown ideal weight.). Liver Function Tests: Recent Labs  Lab 06/27/23 2123  AST 32  ALT 19  ALKPHOS 64  BILITOT 0.4  PROT 6.7  ALBUMIN 2.8*   No results for input(s): "LIPASE", "AMYLASE" in the last 168 hours. No results for input(s): "AMMONIA" in the last 168 hours. Coagulation Profile: No results for input(s): "INR", "PROTIME" in the last 168 hours. Cardiac Enzymes: Recent Labs  Lab 06/27/23 2123  TROPONINIHS 13   BNP (last 3 results) Recent Labs    06/27/23 2123  BNP 73.5   HbA1C: No results for input(s): "HGBA1C" in the last 72 hours. CBG: No results for input(s): "GLUCAP" in the last 168 hours. Lipid Profile: No results for input(s): "CHOL", "HDL", "LDLCALC", "TRIG", "CHOLHDL", "LDLDIRECT" in the last 72 hours. Thyroid Function Tests: No results for input(s): "TSH", "T4TOTAL",  "FREET4", "T3FREE", "THYROIDAB" in the last 72 hours. Anemia Panel: No results for input(s): "VITAMINB12", "FOLATE", "FERRITIN", "TIBC", "IRON", "RETICCTPCT" in the last 72 hours. Urine analysis:    Component Value Date/Time   COLORURINE YELLOW 06/07/2023 1340   APPEARANCEUR HAZY (A) 06/07/2023 1340   LABSPEC 1.008 06/07/2023 1340   PHURINE 7.0 06/07/2023 1340   GLUCOSEU NEGATIVE 06/07/2023 1340   HGBUR NEGATIVE 06/07/2023 1340   BILIRUBINUR NEGATIVE 06/07/2023 1340   KETONESUR NEGATIVE 06/07/2023 1340   PROTEINUR 30 (A) 06/07/2023 1340   UROBILINOGEN 0.2 04/17/2021 1314   NITRITE NEGATIVE 06/07/2023 1340   LEUKOCYTESUR NEGATIVE 06/07/2023 1340    Radiological Exams on Admission: I have personally reviewed images DG Chest Portable 1 View Result Date: 06/27/2023 CLINICAL DATA:  Shortness of breath EXAM: PORTABLE CHEST 1 VIEW COMPARISON:  06/07/2023 FINDINGS: Heart and mediastinal contours within normal limits. Chronic reticulonodular densities throughout the lungs compatible with chronic lung disease, unchanged. No definite acute process. No effusions or acute bony abnormality. IMPRESSION: Stable diffuse chronic lung disease.  No active disease. Electronically Signed   By: Charlett Nose M.D.   On: 06/27/2023 22:25  EKG: My personal interpretation of EKG shows: ***    Assessment/Plan: Active Problems:   * No active hospital problems. *    Assessment and Plan: No notes have been filed under this hospital service. Service: Hospitalist      DVT prophylaxis:  {Blank single:19197::"Lovenox","SQ Heparin","IV heparin gtts","Xarelto","Eliquis","Coumadin","SCDs","***"} Code Status:  {Blank single:19197::"Full Code","DNR with Intubation","DNR/DNI(Do NOT Intubate)","Comfort Care","***"} Diet:  Family Communication:  *** Family was present at bedside, at the time of interview.  Opportunity was given to ask question and all questions were answered satisfactorily.  Disposition Plan:   ***  Consults:  ***  Admission status:   {Blank single:19197::"Observation","Inpatient"}, {Blank single:19197::"Med-Surg","Telemetry bed","Step Down Unit"}  Severity of Illness: {Observation/Inpatient:21159}    Jetty Mort, MD [facility]  How to contact the [facility] Attending or Consulting provider 7A - 7P or covering provider during after hours 7P -7A, for this patient.  Check the care team in [facility] and look for a) attending/consulting [facility] provider listed and b) the [facility] team listed Log into [email or web address] and use [place]'s universal password to access. If you do not have the password, please contact the hospital operator. Locate the [facility] provider you are looking for under [facility] and page to a number that you can be directly reached. If you still have difficulty reaching the provider, please page the [facility] (Director on Call) for the Hospitalists listed on amion for assistance.  06/27/2023, 11:46 PM

## 2023-06-27 NOTE — H&P (Signed)
 History and Physical    [name] NWG:[id number] DOB: March 15, 1930 DOA: 06/27/2023  PCP: Darlene Aspen, MD   Patient coming from: ALF   Chief Complaint: Shortness of breath ED TRIAGE note:  BIBA Per EMS: Pt coming from [facility] w/ c/o SHOB. 2L [place] at baseline - put on NRB due to [facility] and rales.  Rales in Lower lobes.  1 nitroglycerin given en route  179/100 initial BP  142/70 18G L arm  Hx CHF     HPI:  [name] is a 89 y.o. female with medical history significant of chronic interstitial lung disease on 2 L oxygen at baseline, aortic insufficiency, chronic CHF, aneurysm of the thoracic aorta, chronic hyponatremia, CKD stage II, essential hypertension, anemia of chronic disease, gout, dysphagia, chronic hypoxic respiratory failure 2 L oxygen at baseline, chronic physical deconditioning/debility and weakness due to advanced age and grade 2 diastolic heart failure with preserved EF 60 to 65% presenting to the emergency department complaining of shortness of breath. Patient is stating that she has been having worsening dyspnea than usual even though using oxygen 2 L at baseline. Patient denies any pain, lower extremity edema, wheezing, fever and cough. Patient reported 1 episode of melena last week.  En route to ED patient has been given 1 mg of nitro tablet in the setting of high blood pressure and placed on nonrebreather due to shortness of breath and rales.  Currently patient is on 2 L oxygen O2 sat 100%.  During my evaluation at the bedside patient reported feeling uncomfortable, short of breath even though O2 sat 100% room air. Has any chest pain, palpitation, lower extremity swelling, abdominal pain, nausea, vomiting, diarrhea, hematemesis and hemoptysis.  Reported 1 episode of melena/dark tarry stool almost 5 to 6 days ago.    ED Course:  At presentation to ED blood pressure was 179/110 which has been improved to 158/78.  Heart rate 77, O2 sat 100% on 2  L oxygen.  Chest x-ray showing stable chronic lung disease.  No active disease process.  CBC showing hemoglobin drop 7.6.  Baseline hemoglobin around 8-9.  Normal WBC and platelet count. CMP low sodium 133, elevated creatinine 1.01, low albumin 2.6 and low GFR 53.  Normal troponin level. Normal BNP 73.5. Respiratory panel unremarkable. EKG showing normal sinus rhythm heart rate 77.  Left atrial and ventricular enlargement.  In the ED 1 unit of blood transfusion has been ordered.  Hospitalist has been consulted for further evaluation management of symptomatic anemia, melena and acute kidney injury.  Significant labs in the ED: Lab Orders         Resp panel by RT-PCR (RSV, Flu A&B, Covid) Anterior Nasal Swab         CBC with Differential         Comprehensive metabolic panel         Brain natriuretic peptide         Comprehensive metabolic panel         CBC         Urinalysis, Routine w reflex microscopic -Urine, Clean Catch         POC occult blood, ED       Review of Systems:  Review of Systems  Constitutional:  Negative for chills, fever, malaise/fatigue and weight loss.  Respiratory:  Positive for shortness of breath. Negative for cough.   Cardiovascular:  Negative for chest pain, palpitations, orthopnea and leg swelling.  Gastrointestinal:  Positive for melena. Negative  for abdominal pain, blood in stool, constipation, diarrhea, heartburn, nausea and vomiting.  Musculoskeletal:  Negative for back pain, myalgias and neck pain.  Neurological:  Negative for dizziness and headaches.  Psychiatric/Behavioral:  The patient is not nervous/anxious.     Past Medical History:  Diagnosis Date   Anemia    CHRONIC   Aortic insufficiency    a. 2D echo 08/29/15: EF 55-60%, mild LVH, diastolic dysfunction, elevated LV filling pressure, mild AI, severe LAE, mild RAE, mild TR, dilated descending thoracic aorta just distal to the takeoff of the left subclavian, measuring 4.1 cm, no  pericardial effusion.   Arthritis    OA-SOME BACK AND NECK PAIN,  GOES TO CHIROPRACTOR TWICE A MONTH;  HX OF JOINT REPLACEMENTS    Cancer (HCC)    SKIN CANCERS REMOVED FROM LEGS   CKD (chronic kidney disease), stage II    Depression    Dyspnea    Essential hypertension    Gait abnormality 09/08/2017   GERD (gastroesophageal reflux disease)    Low sodium levels    Myocarditis (HCC)    PSVT (paroxysmal supraventricular tachycardia) (HCC)    Recurrent Pericarditis    RECURRENT    Thoracic aneurysm    a. Followed by [facility] -last seen in 12/2013 w/ plan for f/u MRA and thoracic surgery f/u in 2 yrs, but patient does not wish to follow any longer.    Past Surgical History:  Procedure Laterality Date   APPENDECTOMY     BREAST EXCISIONAL BIOPSY Left 1992   benign   EXCISION/RELEASE BURSA HIP Left 11/24/2012   Procedure: LEFT HIP BURSECTOMY AND TENDON REPAIR ;  Surgeon: Darlene Blue, MD;  Location: [facility];  Service: Orthopedics;  Laterality: Left;   HIP CLOSED REDUCTION Left 12/19/2012   Procedure: CLOSED MANIPULATION HIP;  Surgeon: Darlene Bucks, MD;  Location: [facility];  Service: Orthopedics;  Laterality: Left;   HIP CLOSED REDUCTION Left 12/05/2013   Procedure: CLOSED MANIPULATION HIP;  Surgeon: Darlene Rote, MD;  Location: [facility];  Service: Orthopedics;  Laterality: Left;   HIP CLOSED REDUCTION Left 09/22/2015   Procedure: CLOSED REDUCTION HIP;  Surgeon: Darlene Ards, MD;  Location: [facility];  Service: Orthopedics;  Laterality: Left;   HIP CLOSED REDUCTION Left 12/05/2022   Procedure: CLOSED MANIPULATION HIP;  Surgeon: Darlene Rei, MD;  Location: [facility];  Service: Orthopedics;  Laterality: Left;   HIP CLOSED REDUCTION Left 04/12/2023   Procedure: CLOSED MANIPULATION HIP;  Surgeon: Darlene Backer, MD;  Location: [facility];  Service: Orthopedics;  Laterality: Left;   JOINT REPLACEMENT     LEFT TOTAL HIP REPLACEMENT AND REVISIONS X 2   OOPHORECTOMY     has a partial of one ovary remaingin    PELVIC LAPAROSCOPY     ovarian cyst removal,    REVISION TOTAL HIP ARTHROPLASTY     left   right Achilles Tendon repair  5/13   TOTAL KNEE ARTHROPLASTY     right   TOTAL KNEE ARTHROPLASTY     left   VAGINAL HYSTERECTOMY     with ovarian cyst removal     reports that she quit smoking about 60 years ago. Her smoking use included cigarettes. She started smoking about 65 years ago. She has never used smokeless tobacco. She reports that she does not currently use drugs. She reports that she does not drink alcohol.  Allergies  Allergen Reactions   Cephalexin Other (See Comments)    Pt states this caused eye  problems and STROKE-LIKE SYMPTOMS!!   Shellfish-Derived Products Hives and Rash   Tape Other (See Comments)    Please use paper tape or Coban Wrap; patient's skin tears VERY easily!!   Clindamycin/Lincomycin Rash   Lincomycin Rash   Clindamycin Hcl Other (See Comments)    "Per pharmacist"   Fosfomycin Other (See Comments)    Disorientation   Levofloxacin Other (See Comments)    Reaction not recalled by the patient   Other Other (See Comments)    "Steroids" = "Allergic," per [facility]   Penicillamine Other (See Comments)    Reaction not recalled by the patient   Prednisone Other (See Comments)    "Allergic," per [facility]   Sugar-Protein-Starch Other (See Comments)    Sugary foods cause legs to cramp   Ciprofloxacin Rash   Sulfa Antibiotics Hives and Rash   Sulfites Hives, Swelling and Rash    Family History  Problem Relation Age of Onset   Hypertension Mother    Stroke Mother    Heart attack Father 73   Heart failure Father    Heart failure Sister    Aortic aneurysm Sister    Aortic aneurysm Sister    Stroke Sister    Pneumonia Brother    Stroke Brother    ALS Other    Hyperlipidemia Daughter    Aortic aneurysm Son    Breast cancer Neg Hx     Prior to Admission medications   Medication Sig Start Date End Date Taking? Authorizing Provider  acetaminophen (TYLENOL) 500  MG tablet Take 1,000 mg by mouth every 6 (six) hours as needed (for pain).    [provider]  allopurinol (ZYLOPRIM) 300 MG tablet Take 300 mg by mouth daily.    [provider]  amoxicillin (AMOXIL) 500 MG capsule Take 2,000 mg by mouth See admin instructions. Take 2,000 mg by mouth one hour prior to dental procedures    [provider]  B Complex Vitamins (VITAMIN B COMPLEX) TABS Take 1 tablet by mouth daily with breakfast.    [provider]  bisacodyl (DULCOLAX) 5 MG EC tablet Take 10 mg by mouth daily as needed (for constipation).    [provider]  calcium carbonate (TUMS - DOSED IN MG ELEMENTAL CALCIUM) 500 MG chewable tablet Chew 2 tablets by mouth every 6 (six) hours as needed for indigestion or heartburn (MAX OF 8 TABLETS IN 24 HOURS).    [provider]  carvedilol (COREG) 3.125 MG tablet Take 3.125 mg by mouth 2 (two) times daily with a meal.    [provider]  Cholecalciferol (VITAMIN D3) 1000 units CAPS Take 1,000 Units by mouth in the morning.    [provider]  colchicine 0.6 MG tablet Take 0.6 mg by mouth every other day.    [provider]  diltiazem (CARDIZEM CD) 180 MG 24 hr capsule Take 180 mg by mouth daily.    [provider]  Emollient (CETAPHIL MOISTURIZING EX) Apply 1 application  topically See admin instructions. Apply to the body 2 times a day    [provider]  FEROSUL 325 (65 Fe) MG tablet Take 325 mg by mouth daily with breakfast.    [provider]  furosemide (LASIX) 20 MG tablet Take 1 tablet (20 mg total) by mouth daily as needed for edema or fluid (Weight gain of 3 pounds in 1 day or 5 pounds in 1 week). 06/13/23 06/12/24  [name], Rema Care, DO  losartan (COZAAR) 100 MG  tablet Take 1 tablet (100 mg total) by mouth daily. 06/13/23 09/11/23  [name], [name], DO  melatonin 3 MG TABS tablet Take 1 tablet (3 mg total) by mouth at bedtime as needed  (insomnia). Patient taking differently: Take 3 mg by mouth at bedtime. 12/10/22   Rodolph Bong, MD  mirtazapine (REMERON) 15 MG tablet Take 1 tablet (15 mg total) by mouth at bedtime. 06/13/23 09/11/23  [name], [name], DO  NON FORMULARY Take 3 capsules by mouth See admin instructions. Whole produce "FRUIT" and "VEGGIES" capsules- Take 3 capsules of each formulation by mouth in the morning    [provider]  NON FORMULARY Take 4 oz by mouth See admin instructions. PRUNE JUICE- Drink 4 ounces of prune juice by mouth in the morning    [provider]  OVER THE COUNTER MEDICATION Take 1 Scoop by mouth See admin instructions. AG1 Athletic Greens packets- Mix the contents of one packet into 8 ounces of water and drink by mouth once a day    [provider]  OXYGEN Inhale 2 L/min into the lungs at bedtime. 2 L/min at bedtime AND as needed for shortness of breath    [provider]  Skin Protectants, Misc. (MINERIN CREME) CREA Apply 1 Application topically See admin instructions. Apply to lower extremities twice a day    [provider]  sodium chloride 1 g tablet Take 2 tablets (2 g total) by mouth 2 (two) times daily with a meal. 06/13/23 09/11/23  [name], [name], DO  triamcinolone cream (KENALOG) 0.1 % Apply topically 3 (three) times daily. To areas of psoariatic rash. Do not apply to face. Patient taking differently: Apply 1 Application topically 2 (two) times daily as needed (for itching- affected areas of back, hands, chest, and legs). 10/25/22   Johney Maine, MD     Physical Exam: Vitals:   06/27/23 2110 06/28/23 0030  BP: (!) 158/78 (!) 192/82  Pulse: 77 80  Resp: 20 20  Temp: 98.2 F (36.8 C)   TempSrc: Oral   SpO2: 100% 100%    Physical Exam Vitals and nursing note reviewed.  HENT:     Nose: No congestion or rhinorrhea.     Mouth/Throat:     Mouth: Mucous membranes are moist.  Eyes:     Pupils: Pupils are equal, round, and  reactive to light.  Cardiovascular:     Rate and Rhythm: Normal rate and regular rhythm.     Pulses: Normal pulses.     Heart sounds: Murmur heard.  Pulmonary:     Effort: Pulmonary effort is normal.     Breath sounds: Normal breath sounds. No wheezing or rales.  Abdominal:     Palpations: Abdomen is soft.     Tenderness: There is no abdominal tenderness. There is no guarding or rebound.  Musculoskeletal:     Cervical back: Neck supple.  Skin:    General: Skin is dry.     Capillary Refill: Capillary refill takes less than 2 seconds.  Neurological:     Mental Status: She is alert and oriented to person, place, and time.  Psychiatric:        Mood and Affect: Mood normal.        Thought Content: Thought content normal.        Judgment: Judgment normal.      Labs on Admission: I have personally reviewed following labs and imaging studies  CBC: Recent Labs  Lab 06/27/23 2123  WBC 6.5  NEUTROABS 3.7  HGB 7.6*  HCT 24.8*  MCV 84.9  PLT 306   Basic Metabolic Panel: Recent Labs  Lab 06/27/23 2123  NA 133*  K 4.9  CL 104  CO2 24  GLUCOSE 95  BUN 30*  CREATININE 1.01*  CALCIUM 8.4*   GFR: CrCl cannot be calculated (Unknown ideal weight.). Liver Function Tests: Recent Labs  Lab 06/27/23 2123  AST 32  ALT 19  ALKPHOS 64  BILITOT 0.4  PROT 6.7  ALBUMIN 2.8*   No results for input(s): "LIPASE", "AMYLASE" in the last 168 hours. No results for input(s): "AMMONIA" in the last 168 hours. Coagulation Profile: No results for input(s): "INR", "PROTIME" in the last 168 hours. Cardiac Enzymes: Recent Labs  Lab 06/27/23 2123 06/27/23 2352  TROPONINIHS 13 15   BNP (last 3 results) Recent Labs    06/27/23 2123  BNP 73.5   HbA1C: No results for input(s): "HGBA1C" in the last 72 hours. CBG: No results for input(s): "GLUCAP" in the last 168 hours. Lipid Profile: No results for input(s): "CHOL", "HDL", "LDLCALC", "TRIG", "CHOLHDL", "LDLDIRECT" in the last 72  hours. Thyroid Function Tests: No results for input(s): "TSH", "T4TOTAL", "FREET4", "T3FREE", "THYROIDAB" in the last 72 hours. Anemia Panel: No results for input(s): "VITAMINB12", "FOLATE", "FERRITIN", "TIBC", "IRON", "RETICCTPCT" in the last 72 hours. Urine analysis:    Component Value Date/Time   COLORURINE YELLOW 06/28/2023 0033   APPEARANCEUR CLEAR 06/28/2023 0033   LABSPEC 1.012 06/28/2023 0033   PHURINE 6.0 06/28/2023 0033   GLUCOSEU NEGATIVE 06/28/2023 0033   HGBUR NEGATIVE 06/28/2023 0033   BILIRUBINUR NEGATIVE 06/28/2023 0033   KETONESUR NEGATIVE 06/28/2023 0033   PROTEINUR 30 (A) 06/28/2023 0033   UROBILINOGEN 0.2 04/17/2021 1314   NITRITE NEGATIVE 06/28/2023 0033   LEUKOCYTESUR NEGATIVE 06/28/2023 0033    Radiological Exams on Admission: I have personally reviewed images DG Chest Portable 1 View Result Date: 06/27/2023 CLINICAL DATA:  Shortness of breath EXAM: PORTABLE CHEST 1 VIEW COMPARISON:  06/07/2023 FINDINGS: Heart and mediastinal contours within normal limits. Chronic reticulonodular densities throughout the lungs compatible with chronic lung disease, unchanged. No definite acute process. No effusions or acute bony abnormality. IMPRESSION: Stable diffuse chronic lung disease.  No active disease. Electronically Signed   By: Janeece Mechanic M.D.   On: 06/27/2023 22:25     EKG: My personal interpretation of EKG shows: EKG showing normal sinus rhythm heart rate 77, left atrial and left ventricular enlargement.     Assessment/Plan: Principal Problem:   Symptomatic anemia Active Problems:   Chronic heart failure with preserved ejection fraction (HFpEF) (HCC)   Hypertensive emergency   Essential hypertension   Chronic hyponatremia   Chronic diastolic CHF (congestive heart failure) (HCC)   Acute kidney injury superimposed on chronic kidney disease (HCC)   History of dysphagia   History of gout   Chronic hypoxic respiratory failure (HCC)    Assessment and  Plan: Symptom anemia Acute on chronic anemia Melena -Patient to emergency department acute on chronic worsening shortness of breath and fatigue.  Reported 1 episode of melena 7 days ago. - At presentation to ED patient is hemodynamically stable. - CBC showing hemoglobin drop to 7.6.  Baseline hemoglobin around 9. -FOBT negative. - No previous history of GI bleed, previous EGD and colonoscopy. - Patient denies any abdominal pain, nausea, vomiting, fresh blood per rectum.  Denies any history of hemorrhoid - Patient becomes tachycardic with ambulation. - Symptomatic acute on chronic anemia possibly in  the setting of GI bleed/melena - Giving IV Protonix 80 mg now and continue IV Protonix 40 mg twice daily. - Continue to trend CBC and transfusing 1 unit of blood tonight - Consulted [facility] Dr. Leonides Schanz for further recommendation.  Hypertensive emergency Essential hypertension Diastolic heart failure preserved EF 60 to 65% Aortic insufficiency -EMS found systolic blood pressure is upper 170s range.  Blood pressure has been improved to 158/78 after sublingual nitro.  EMS also reported patient has significant rales and initially was nonrebreather but presented to ED O2 liter oxygen O2 sat 100% -During my evaluation at the bedside systolic blood pressure is 192/82, heart rate 80 and O2 sat 100% on 2 L.  -Verified with pharmacy.  Patient already received Cardizem and losartan this morning at the nursing home. -CMP showing elevated creatinine.  In the setting of endorgan damage and systolic blood pressure above 161 meets criteria for hypertensive emergency. - Normal BNP. - Chest x-ray unremarkable. -Continue Coreg, Cardizem, losartan, Lasix, and IV hydralazine as needed. -Continue cardiac monitoring  Chronic hyponatremia -Serum sodium 133.  Continue salt tab 2 g twice daily.   AKI on CKD stage II -Elevated creatinine 1.01.  Baseline GFR above 60.   Prerenal acute kidney injury in the setting  of hypertensive emergency.  Holding IV fluid. - Continue to monitor renal function and avoid nephrotoxic agent.   History of gout - Continue allopurinol  Interstitial lung disease Chronic hypoxic respiratory failure 2 L oxygen baseline - Patient has history of interstitial lung disease.  Stable.  O2 sat 100% to liter oxygen. - Continue DuoNeb as needed for wheezing and shortness of breath.  Continue to check pulse ox and supplemental oxygen.   History of chronic dysphagia - Speech been evaluated during last hospitalization Recommendation following: Regular;Thin liquid    Liquid Administration via: Cup;Straw Medication Administration: Other (Comment) (As tolerated) Supervision: Comment (Set up assisted patient able to feed herself) Compensations: Slow rate;Small sips/bites (Check for oral retention) Postural Changes: Seated upright at 90 degrees;Remain upright for at least 30 minutes after po intake.   DVT prophylaxis:  SCDs.  Deferring pharmacological prophylaxis high risk of GI bleed Code Status: DNR. Diet: Heart healthy diet Regular;Thin liquid    Liquid Administration via: Cup;Straw Medication Administration: Other (Comment) (As tolerated) Supervision: Comment (Set up assisted patient able to feed herself) Compensations: Slow rate;Small sips/bites (Check for oral retention) Postural Changes: Seated upright at 90 degrees;Remain upright for at least 30 minutes after po intake. Family Communication:   Family was present at bedside, at the time of interview. Opportunity was given to ask question and all questions were answered satisfactorily.  Disposition Plan: Continue monitor H&H and development of any overt GI bleed. Consults: Gastroenterology Admission status:   Observation, Step Down Unit     Media Information   Document Information  Photos    06/28/2023 00:19  Attached To:  Hospital Encounter on 06/27/23  Source Information  Tereasa Coop, MD  [facility]  Document History    Severity of Illness: The appropriate patient status for this patient is observation. Inpatient status is judged to be reasonable and necessary in order to provide the required intensity of service to ensure the patient's safety. The patient's presenting symptoms, physical exam findings, and initial radiographic and laboratory data in the context of their chronic comorbidities is felt to place them at high risk for further clinical deterioration. Furthermore, it is  anticipated that the patient will be medically stable for discharge from the  hospital within 2 midnights of admission.   * I certify that at the point of admission it is my clinical judgment that the patient will require inpatient hospital care spanning beyond 2 midnights from the point of admission due to high intensity of service, high risk for further deterioration and high frequency of surveillance required.Aaron Aas    Holland Nickson, MD [facility]  How to contact the [facility] Attending or Consulting provider 7A - 7P or covering provider during after hours 7P -7A, for this patient.  Check the care team in [facility] and look for a) attending/consulting [facility] provider listed and b) the [facility] team listed Log into [email or web address] and use [facility] universal password to access. If you do not have the password, please contact the hospital operator. Locate the [facility] provider you are looking for under [facility] and page to a number that you can be directly reached. If you still have difficulty reaching the provider, please page the [facility] (Director on Call) for the Hospitalists listed on amion for assistance.  06/28/2023, 12:41 AM

## 2023-06-27 NOTE — ED Triage Notes (Signed)
 BIBA Per EMS: Pt coming from [facility] w/ c/o SHOB. 2L [place] at baseline - put on NRB due to [facility] and rales.  Rales in Lower lobes.  1 nitroglycerin given en route  179/100 initial BP  142/70 18G L arm  Hx CHF

## 2023-06-28 DIAGNOSIS — E871 Hypo-osmolality and hyponatremia: Secondary | ICD-10-CM | POA: Diagnosis present

## 2023-06-28 DIAGNOSIS — R627 Adult failure to thrive: Secondary | ICD-10-CM | POA: Diagnosis present

## 2023-06-28 DIAGNOSIS — Z515 Encounter for palliative care: Secondary | ICD-10-CM | POA: Diagnosis not present

## 2023-06-28 DIAGNOSIS — I5032 Chronic diastolic (congestive) heart failure: Secondary | ICD-10-CM | POA: Diagnosis present

## 2023-06-28 DIAGNOSIS — Z7401 Bed confinement status: Secondary | ICD-10-CM | POA: Diagnosis not present

## 2023-06-28 DIAGNOSIS — Z66 Do not resuscitate: Secondary | ICD-10-CM | POA: Diagnosis present

## 2023-06-28 DIAGNOSIS — Z888 Allergy status to other drugs, medicaments and biological substances status: Secondary | ICD-10-CM | POA: Diagnosis not present

## 2023-06-28 DIAGNOSIS — Z96653 Presence of artificial knee joint, bilateral: Secondary | ICD-10-CM | POA: Diagnosis present

## 2023-06-28 DIAGNOSIS — I130 Hypertensive heart and chronic kidney disease with heart failure and stage 1 through stage 4 chronic kidney disease, or unspecified chronic kidney disease: Secondary | ICD-10-CM | POA: Diagnosis present

## 2023-06-28 DIAGNOSIS — J9611 Chronic respiratory failure with hypoxia: Secondary | ICD-10-CM | POA: Diagnosis present

## 2023-06-28 DIAGNOSIS — K921 Melena: Secondary | ICD-10-CM | POA: Diagnosis not present

## 2023-06-28 DIAGNOSIS — R1310 Dysphagia, unspecified: Secondary | ICD-10-CM | POA: Diagnosis present

## 2023-06-28 DIAGNOSIS — R0602 Shortness of breath: Secondary | ICD-10-CM | POA: Diagnosis present

## 2023-06-28 DIAGNOSIS — I161 Hypertensive emergency: Secondary | ICD-10-CM

## 2023-06-28 DIAGNOSIS — Z96642 Presence of left artificial hip joint: Secondary | ICD-10-CM | POA: Diagnosis present

## 2023-06-28 DIAGNOSIS — I351 Nonrheumatic aortic (valve) insufficiency: Secondary | ICD-10-CM | POA: Diagnosis present

## 2023-06-28 DIAGNOSIS — N182 Chronic kidney disease, stage 2 (mild): Secondary | ICD-10-CM | POA: Diagnosis present

## 2023-06-28 DIAGNOSIS — D649 Anemia, unspecified: Secondary | ICD-10-CM | POA: Diagnosis not present

## 2023-06-28 DIAGNOSIS — Z882 Allergy status to sulfonamides status: Secondary | ICD-10-CM | POA: Diagnosis not present

## 2023-06-28 DIAGNOSIS — Z85828 Personal history of other malignant neoplasm of skin: Secondary | ICD-10-CM | POA: Diagnosis not present

## 2023-06-28 DIAGNOSIS — F32A Depression, unspecified: Secondary | ICD-10-CM | POA: Diagnosis present

## 2023-06-28 DIAGNOSIS — Z87891 Personal history of nicotine dependence: Secondary | ICD-10-CM | POA: Diagnosis not present

## 2023-06-28 DIAGNOSIS — D631 Anemia in chronic kidney disease: Secondary | ICD-10-CM | POA: Diagnosis present

## 2023-06-28 DIAGNOSIS — D62 Acute posthemorrhagic anemia: Secondary | ICD-10-CM | POA: Diagnosis not present

## 2023-06-28 DIAGNOSIS — I712 Thoracic aortic aneurysm, without rupture, unspecified: Secondary | ICD-10-CM | POA: Diagnosis present

## 2023-06-28 DIAGNOSIS — R531 Weakness: Secondary | ICD-10-CM | POA: Diagnosis not present

## 2023-06-28 DIAGNOSIS — J849 Interstitial pulmonary disease, unspecified: Secondary | ICD-10-CM | POA: Diagnosis present

## 2023-06-28 DIAGNOSIS — M109 Gout, unspecified: Secondary | ICD-10-CM | POA: Diagnosis present

## 2023-06-28 DIAGNOSIS — N179 Acute kidney failure, unspecified: Secondary | ICD-10-CM | POA: Diagnosis present

## 2023-06-28 DIAGNOSIS — Z90721 Acquired absence of ovaries, unilateral: Secondary | ICD-10-CM | POA: Diagnosis not present

## 2023-06-28 LAB — COMPREHENSIVE METABOLIC PANEL WITH GFR
ALT: 20 U/L (ref 0–44)
AST: 29 U/L (ref 15–41)
Albumin: 2.9 g/dL — ABNORMAL LOW (ref 3.5–5.0)
Alkaline Phosphatase: 63 U/L (ref 38–126)
Anion gap: 9 (ref 5–15)
BUN: 27 mg/dL — ABNORMAL HIGH (ref 8–23)
CO2: 25 mmol/L (ref 22–32)
Calcium: 8.9 mg/dL (ref 8.9–10.3)
Chloride: 100 mmol/L (ref 98–111)
Creatinine, Ser: 1.06 mg/dL — ABNORMAL HIGH (ref 0.44–1.00)
GFR, Estimated: 50 mL/min — ABNORMAL LOW (ref >60)
Glucose, Bld: 83 mg/dL (ref 70–99)
Potassium: 4.2 mmol/L (ref 3.5–5.1)
Sodium: 134 mmol/L — ABNORMAL LOW (ref 135–145)
Total Bilirubin: 0.8 mg/dL (ref 0.0–1.2)
Total Protein: 7.6 g/dL (ref 6.5–8.1)

## 2023-06-28 LAB — CBC
HCT: 31.2 % — ABNORMAL LOW (ref 36.0–46.0)
Hemoglobin: 10 g/dL — ABNORMAL LOW (ref 12.0–15.0)
MCH: 26.9 pg (ref 26.0–34.0)
MCHC: 32.1 g/dL (ref 30.0–36.0)
MCV: 83.9 fL (ref 80.0–100.0)
Platelets: 335 10*3/uL (ref 150–400)
RBC: 3.72 MIL/uL — ABNORMAL LOW (ref 3.87–5.11)
RDW: 17.7 % — ABNORMAL HIGH (ref 11.5–15.5)
WBC: 8.1 10*3/uL (ref 4.0–10.5)
nRBC: 0 % (ref 0.0–0.2)

## 2023-06-28 LAB — TROPONIN I (HIGH SENSITIVITY): Troponin I (High Sensitivity): 15 ng/L (ref <18)

## 2023-06-28 LAB — URINALYSIS, ROUTINE W REFLEX MICROSCOPIC
Bacteria, UA: NONE SEEN
Bilirubin Urine: NEGATIVE
Glucose, UA: NEGATIVE mg/dL
Hgb urine dipstick: NEGATIVE
Ketones, ur: NEGATIVE mg/dL
Leukocytes,Ua: NEGATIVE
Nitrite: NEGATIVE
Protein, ur: 30 mg/dL — AB
Specific Gravity, Urine: 1.012 (ref 1.005–1.030)
pH: 6 (ref 5.0–8.0)

## 2023-06-28 LAB — PREPARE RBC (CROSSMATCH)

## 2023-06-28 MED ORDER — SODIUM CHLORIDE 1 G PO TABS
2.0000 g | ORAL_TABLET | Freq: Two times a day (BID) | ORAL | Status: DC
  Administered 2023-06-28 – 2023-06-29 (×3): 2 g via ORAL
  Filled 2023-06-28 (×4): qty 2

## 2023-06-28 MED ORDER — SODIUM CHLORIDE 0.9% FLUSH
3.0000 mL | Freq: Two times a day (BID) | INTRAVENOUS | Status: DC
  Administered 2023-06-28 – 2023-06-29 (×3): 3 mL via INTRAVENOUS

## 2023-06-28 MED ORDER — FERROUS SULFATE 325 (65 FE) MG PO TABS
325.0000 mg | ORAL_TABLET | Freq: Every day | ORAL | Status: DC
  Administered 2023-06-28 – 2023-06-29 (×2): 325 mg via ORAL
  Filled 2023-06-28 (×2): qty 1

## 2023-06-28 MED ORDER — ONDANSETRON HCL 4 MG PO TABS: 4.0000 mg | ORAL_TABLET | Freq: Four times a day (QID) | ORAL | Status: DC | PRN

## 2023-06-28 MED ORDER — LACTATED RINGERS IV SOLN: INTRAVENOUS | Status: DC

## 2023-06-28 MED ORDER — BISACODYL 5 MG PO TBEC: 10.0000 mg | DELAYED_RELEASE_TABLET | Freq: Every day | ORAL | Status: DC | PRN

## 2023-06-28 MED ORDER — FUROSEMIDE 20 MG PO TABS
20.0000 mg | ORAL_TABLET | Freq: Every day | ORAL | Status: DC
  Administered 2023-06-28 – 2023-06-29 (×3): 20 mg via ORAL
  Filled 2023-06-28 (×3): qty 1

## 2023-06-28 MED ORDER — LOSARTAN POTASSIUM 50 MG PO TABS: 100.0000 mg | ORAL_TABLET | Freq: Every day | ORAL | Status: DC

## 2023-06-28 MED ORDER — IPRATROPIUM-ALBUTEROL 0.5-2.5 (3) MG/3ML IN SOLN
3.0000 mL | RESPIRATORY_TRACT | Status: DC | PRN
  Administered 2023-06-28: 3 mL via RESPIRATORY_TRACT
  Filled 2023-06-28: qty 3

## 2023-06-28 MED ORDER — CARVEDILOL 3.125 MG PO TABS: 3.1250 mg | ORAL_TABLET | Freq: Two times a day (BID) | ORAL | Status: DC

## 2023-06-28 MED ORDER — SODIUM CHLORIDE 0.9% FLUSH: 3.0000 mL | INTRAVENOUS | Status: DC | PRN

## 2023-06-28 MED ORDER — ACETAMINOPHEN 650 MG RE SUPP: 650.0000 mg | Freq: Four times a day (QID) | RECTAL | Status: DC | PRN

## 2023-06-28 MED ORDER — HYDRALAZINE HCL 20 MG/ML IJ SOLN: 5.0000 mg | Freq: Four times a day (QID) | INTRAMUSCULAR | Status: DC | PRN

## 2023-06-28 MED ORDER — PANTOPRAZOLE SODIUM 40 MG IV SOLR
40.0000 mg | Freq: Two times a day (BID) | INTRAVENOUS | Status: DC
  Administered 2023-06-28 – 2023-06-29 (×2): 40 mg via INTRAVENOUS
  Filled 2023-06-28 (×3): qty 10

## 2023-06-28 MED ORDER — LOSARTAN POTASSIUM 50 MG PO TABS
100.0000 mg | ORAL_TABLET | Freq: Every day | ORAL | Status: DC
  Administered 2023-06-29: 100 mg via ORAL
  Filled 2023-06-28: qty 2

## 2023-06-28 MED ORDER — DILTIAZEM HCL ER COATED BEADS 180 MG PO CP24
180.0000 mg | ORAL_CAPSULE | Freq: Every day | ORAL | Status: DC
  Administered 2023-06-28 – 2023-06-29 (×2): 180 mg via ORAL
  Filled 2023-06-28 (×2): qty 1

## 2023-06-28 MED ORDER — SODIUM CHLORIDE 0.9 % IV SOLN: 250.0000 mL | INTRAVENOUS | Status: AC | PRN

## 2023-06-28 MED ORDER — CALCIUM CARBONATE ANTACID 500 MG PO CHEW: 2.0000 | CHEWABLE_TABLET | Freq: Four times a day (QID) | ORAL | Status: DC | PRN

## 2023-06-28 MED ORDER — FUROSEMIDE 20 MG PO TABS: 20.0000 mg | ORAL_TABLET | Freq: Every day | ORAL | Status: DC

## 2023-06-28 MED ORDER — MIRTAZAPINE 15 MG PO TABS
15.0000 mg | ORAL_TABLET | Freq: Every day | ORAL | Status: DC
  Administered 2023-06-28: 15 mg via ORAL
  Filled 2023-06-28 (×2): qty 1

## 2023-06-28 MED ORDER — PANTOPRAZOLE SODIUM 40 MG IV SOLR
80.0000 mg | Freq: Once | INTRAVENOUS | Status: AC
  Administered 2023-06-28: 80 mg via INTRAVENOUS
  Filled 2023-06-28: qty 20

## 2023-06-28 MED ORDER — CARVEDILOL 3.125 MG PO TABS
3.1250 mg | ORAL_TABLET | Freq: Two times a day (BID) | ORAL | Status: DC
  Administered 2023-06-28 – 2023-06-29 (×4): 3.125 mg via ORAL
  Filled 2023-06-28 (×4): qty 1

## 2023-06-28 MED ORDER — ALLOPURINOL 300 MG PO TABS
300.0000 mg | ORAL_TABLET | Freq: Every day | ORAL | Status: DC
  Administered 2023-06-28 – 2023-06-29 (×2): 300 mg via ORAL
  Filled 2023-06-28: qty 3
  Filled 2023-06-28: qty 1

## 2023-06-28 MED ORDER — ACETAMINOPHEN 325 MG PO TABS
650.0000 mg | ORAL_TABLET | Freq: Four times a day (QID) | ORAL | Status: DC | PRN
  Administered 2023-06-28: 650 mg via ORAL
  Filled 2023-06-28: qty 2

## 2023-06-28 MED ORDER — HYDRALAZINE HCL 20 MG/ML IJ SOLN
10.0000 mg | Freq: Four times a day (QID) | INTRAMUSCULAR | Status: DC | PRN
  Administered 2023-06-28 – 2023-06-29 (×2): 10 mg via INTRAVENOUS
  Filled 2023-06-28 (×2): qty 1

## 2023-06-28 MED ORDER — ONDANSETRON HCL 4 MG/2ML IJ SOLN: 4.0000 mg | Freq: Four times a day (QID) | INTRAMUSCULAR | Status: DC | PRN

## 2023-06-28 NOTE — Progress Notes (Signed)
 PROGRESS NOTE    [name]  GNF:[id number] DOB: Aug 26, 1929 DOA: 06/27/2023 PCP: Benedetta Bradley, MD   Brief Narrative:  [name] is a 89 y.o. female with medical history significant of chronic interstitial lung disease on 2 L oxygen at baseline, aortic insufficiency, chronic CHF, aneurysm of the thoracic aorta, chronic hyponatremia, CKD stage II, essential hypertension, anemia of chronic disease, gout, dysphagia, chronic hypoxic respiratory failure 2 L oxygen at baseline, chronic physical deconditioning/debility and weakness due to advanced age and grade 2 diastolic heart failure with preserved EF 60 to 65% presenting to the emergency department complaining of shortness of breath.   Patient admitted with acute symptomatic anemia concerning for acute blood loss anemia on chronic anemia of chronic disease given reports of melena.  GI consulted, hospitalist called for admission  Assessment & Plan:   Principal Problem:   Symptomatic anemia Active Problems:   Chronic heart failure with preserved ejection fraction (HFpEF) (HCC)   Hypertensive emergency   Essential hypertension   Chronic hyponatremia   Chronic diastolic CHF (congestive heart failure) (HCC)   Acute kidney injury superimposed on chronic kidney disease (HCC)   History of dysphagia   History of gout   Chronic hypoxic respiratory failure (HCC)   Acute symptom anemia on chronic anemia of chronic disease (CKD), POA Melena, transient -reported to have resolved -Reported melena 1 week prior to admission, resolved per patient, FOBT negative - Anemia improved with transfusion, hemoglobin currently 10.0 - [facility], Dr. Rosaline Coma consulted at intake    Hypertensive emergency Essential hypertension Diastolic heart failure preserved EF 60 to 65% Aortic insufficiency -Blood pressure improved with treatment(nitro SL) -Downtrending as appropriate, continue carvedilol, furosemide, losartan - Patient is not  acutely hypoxic, satting well on her 2 L nasal cannula baseline -Continue cardiac monitoring   AKI on CKD stage II - Likely secondary to above, continue blood pressure control/follow anemia   History of gout - Continue allopurinol   Chronic hyponatremia -Continue home salt tab 2 g twice daily  Interstitial lung disease Chronic hypoxic respiratory failure 2 L oxygen baseline -Continue 2 L nasal cannula, her baseline -Nebs as needed   History of chronic dysphagia Speech previously evaluated, recommending regular diet with thin liquids Liquid Administration via: Cup;Straw Medication Administration: Other (Comment) (As tolerated) Supervision: Comment (Set up assisted patient able to feed herself) Compensations: Slow rate;Small sips/bites (Check for oral retention) Postural Changes: Seated upright at 90 degrees;Remain upright for at least 30 minutes after po intake.  DVT prophylaxis: SCDs Start: 06/28/23 0003 Place TED hose Start: 06/28/23 0003 Code Status:   Code Status: Limited: Do not attempt resuscitation (DNR) -DNR-LIMITED -Do Not Intubate/DNI  Family Communication: None present  Status is: Inpatient  Dispo: The patient is from: Home              Anticipated d/c is to: Home              Anticipated d/c date is: 48 to 72 hours              Patient currently not medically stable for discharge  Consultants:  GI  Procedures:  Pending above, none currently planned  Antimicrobials:  None indicated  Subjective: No acute issues or events overnight, tolerated transfusion well otherwise denies nausea vomiting diarrhea constipation headache fevers chills or chest pain  Objective: Vitals:   06/28/23 0230 06/28/23 0330 06/28/23 0356 06/28/23 0630  BP: (!) 157/78 (!) 171/98 (!) 171/98 (!) 168/94  Pulse: 97 96 96  98  Resp: 17 18 18 12   Temp:   98.1 F (36.7 C)   TempSrc:   Oral   SpO2: 100% 100% 100% 100%    Intake/Output Summary (Last 24 hours) at 06/28/2023 0715 Last  data filed at 06/28/2023 0353 Gross per 24 hour  Intake 314 ml  Output --  Net 314 ml   There were no vitals filed for this visit.  Examination:  General:  Pleasantly resting in bed, No acute distress. HEENT:  Normocephalic atraumatic.  Sclerae nonicteric, noninjected.  Extraocular movements intact bilaterally. Neck:  Without mass or deformity.  Trachea is midline. Lungs:  Clear to auscultate bilaterally without rhonchi, wheeze, or rales. Heart:  Regular rate and rhythm.  Without murmurs, rubs, or gallops. Abdomen:  Soft, nontender, nondistended.  Without guarding or rebound. Extremities: Without cyanosis, clubbing, edema, or obvious deformity. Skin:  Warm and dry, no erythema.  Data Reviewed: I have personally reviewed following labs and imaging studies  CBC: Recent Labs  Lab 06/27/23 2123 06/28/23 0556  WBC 6.5 8.1  NEUTROABS 3.7  --   HGB 7.6* 10.0*  HCT 24.8* 31.2*  MCV 84.9 83.9  PLT 306 335   Basic Metabolic Panel: Recent Labs  Lab 06/27/23 2123 06/28/23 0556  NA 133* 134*  K 4.9 4.2  CL 104 100  CO2 24 25  GLUCOSE 95 83  BUN 30* 27*  CREATININE 1.01* 1.06*  CALCIUM 8.4* 8.9   GFR: CrCl cannot be calculated (Unknown ideal weight.). Liver Function Tests: Recent Labs  Lab 06/27/23 2123 06/28/23 0556  AST 32 29  ALT 19 20  ALKPHOS 64 63  BILITOT 0.4 0.8  PROT 6.7 7.6  ALBUMIN 2.8* 2.9*    Recent Results (from the past 240 hours)  Resp panel by RT-PCR (RSV, Flu A&B, Covid) Anterior Nasal Swab     Status: None   Collection Time: 06/27/23 10:27 PM   Specimen: Anterior Nasal Swab  Result Value Ref Range Status   SARS Coronavirus 2 by RT PCR NEGATIVE NEGATIVE Final   Influenza A by PCR NEGATIVE NEGATIVE Final   Influenza B by PCR NEGATIVE NEGATIVE Final    Comment: (NOTE) The Xpert Xpress SARS-CoV-2/FLU/RSV plus assay is intended as an aid in the diagnosis of influenza from Nasopharyngeal swab specimens and should not be used as a sole basis for  treatment. Nasal washings and aspirates are unacceptable for Xpert Xpress SARS-CoV-2/FLU/RSV testing.  Fact Sheet for Patients: BloggerCourse.com  Fact Sheet for Healthcare Providers: SeriousBroker.it  This test is not yet approved or cleared by the [place] FDA and has been authorized for detection and/or diagnosis of SARS-CoV-2 by FDA under an Emergency Use Authorization (EUA). This EUA will remain in effect (meaning this test can be used) for the duration of the COVID-19 declaration under Section 564(b)(1) of the Act, 21 U.S.C. section 360bbb-3(b)(1), unless the authorization is terminated or revoked.     Resp Syncytial Virus by PCR NEGATIVE NEGATIVE Final    Comment: (NOTE) Fact Sheet for Patients: BloggerCourse.com  Fact Sheet for Healthcare Providers: SeriousBroker.it  This test is not yet approved or cleared by the [place] FDA and has been authorized for detection and/or diagnosis of SARS-CoV-2 by FDA under an Emergency Use Authorization (EUA). This EUA will remain in effect (meaning this test can be used) for the duration of the COVID-19 declaration under Section 564(b)(1) of the Act, 21 U.S.C. section 360bbb-3(b)(1), unless the authorization is terminated or revoked.  Performed at [facility], [address], [place], [place] [address]    Radiology Studies: DG Chest Portable 1 View Result Date: 06/27/2023 CLINICAL DATA:  Shortness of breath EXAM: PORTABLE CHEST 1 VIEW COMPARISON:  06/07/2023 FINDINGS: Heart and mediastinal contours within normal limits. Chronic reticulonodular densities throughout the lungs compatible with chronic lung disease, unchanged. No definite acute process. No effusions or acute bony abnormality. IMPRESSION: Stable diffuse chronic lung disease.  No active disease. Electronically Signed   By: Janeece Mechanic M.D.   On: 06/27/2023  22:25   Scheduled Meds:  sodium chloride   Intravenous Once   allopurinol  300 mg Oral Daily   carvedilol  3.125 mg Oral BID WC   diltiazem  180 mg Oral Daily   ferrous sulfate  325 mg Oral Q breakfast   furosemide  20 mg Oral Daily   [START ON 06/29/2023] losartan  100 mg Oral Daily   mirtazapine  15 mg Oral QHS   pantoprazole (PROTONIX) IV  40 mg Intravenous Q12H   sodium chloride flush  3 mL Intravenous Q12H   sodium chloride flush  3 mL Intravenous Q12H   sodium chloride  2 g Oral BID WC   Continuous Infusions:  sodium chloride       LOS: 0 days   Time spent:  Haydee Lipa, DO [facility]  If 7PM-7AM, please contact night-coverage [email or web address]  06/28/2023, 7:15 AM

## 2023-06-28 NOTE — Progress Notes (Signed)
 Patient ID: [name], female   DOB: 1929-10-09, 89 y.o.   MRN: [id number]  Discussed plan with son Dustin Gimenez) who reports having HCPOA and he does not want her to undergo sedation or an EGD and he and his sister are contemplating hospice. EGD will be cancelled. Clear liquid diet. Advance per hospitalist recs. Will sign off. Call if questions.

## 2023-06-28 NOTE — Consult Note (Addendum)
 Referring Provider: Dr. Ulice Gamer Primary Care Physician:  Benedetta Bradley, MD Primary Gastroenterologist:  Dr. Feliberto Hopping  Reason for Consultation:  Melena  HPI: [name] is a 89 y.o. female with multiple medical problems as stated below had 1 episode of black stools (on iron pills) last week that has not recurred. Denies abdominal pain, N/V, dizziness, hematochezia, or hematemesis. Felt short of breath and came to ER. Hgb 7.6 (9.6  on 06/08/23). Transfused one unit PRBCs to 10 from 7.6. No known history of peptic ulcer disease. Heme NEG.  Past Medical History:  Diagnosis Date   Anemia    CHRONIC   Aortic insufficiency    a. 2D echo 08/29/15: EF 55-60%, mild LVH, diastolic dysfunction, elevated LV filling pressure, mild AI, severe LAE, mild RAE, mild TR, dilated descending thoracic aorta just distal to the takeoff of the left subclavian, measuring 4.1 cm, no pericardial effusion.   Arthritis    OA-SOME BACK AND NECK PAIN,  GOES TO CHIROPRACTOR TWICE A MONTH;  HX OF JOINT REPLACEMENTS    Cancer (HCC)    SKIN CANCERS REMOVED FROM LEGS   CKD (chronic kidney disease), stage II    Depression    Dyspnea    Essential hypertension    Gait abnormality 09/08/2017   GERD (gastroesophageal reflux disease)    Low sodium levels    Myocarditis (HCC)    PSVT (paroxysmal supraventricular tachycardia) (HCC)    Recurrent Pericarditis    RECURRENT    Thoracic aneurysm    a. Followed by [facility] -last seen in 12/2013 w/ plan for f/u MRA and thoracic surgery f/u in 2 yrs, but patient does not wish to follow any longer.    Past Surgical History:  Procedure Laterality Date   APPENDECTOMY     BREAST EXCISIONAL BIOPSY Left 1992   benign   EXCISION/RELEASE BURSA HIP Left 11/24/2012   Procedure: LEFT HIP BURSECTOMY AND TENDON REPAIR ;  Surgeon: Aurther Blue, MD;  Location: [facility];  Service: Orthopedics;  Laterality: Left;   HIP CLOSED REDUCTION Left 12/19/2012   Procedure: CLOSED MANIPULATION HIP;   Surgeon: Bevin Bucks, MD;  Location: [facility];  Service: Orthopedics;  Laterality: Left;   HIP CLOSED REDUCTION Left 12/05/2013   Procedure: CLOSED MANIPULATION HIP;  Surgeon: Lorriane Rote, MD;  Location: [facility];  Service: Orthopedics;  Laterality: Left;   HIP CLOSED REDUCTION Left 09/22/2015   Procedure: CLOSED REDUCTION HIP;  Surgeon: Mort Ards, MD;  Location: [facility];  Service: Orthopedics;  Laterality: Left;   HIP CLOSED REDUCTION Left 12/05/2022   Procedure: CLOSED MANIPULATION HIP;  Surgeon: Liliane Rei, MD;  Location: [facility];  Service: Orthopedics;  Laterality: Left;   HIP CLOSED REDUCTION Left 04/12/2023   Procedure: CLOSED MANIPULATION HIP;  Surgeon: Amada Backer, MD;  Location: [facility];  Service: Orthopedics;  Laterality: Left;   JOINT REPLACEMENT     LEFT TOTAL HIP REPLACEMENT AND REVISIONS X 2   OOPHORECTOMY     has a partial of one ovary remaingin   PELVIC LAPAROSCOPY     ovarian cyst removal,    REVISION TOTAL HIP ARTHROPLASTY     left   right Achilles Tendon repair  5/13   TOTAL KNEE ARTHROPLASTY     right   TOTAL KNEE ARTHROPLASTY     left   VAGINAL HYSTERECTOMY     with ovarian cyst removal    Prior to Admission medications   Medication Sig Start Date End Date Taking?  Authorizing Provider  acetaminophen (TYLENOL) 500 MG tablet Take 1,000 mg by mouth 3 (three) times daily as needed (for pain).   Yes [provider]  allopurinol (ZYLOPRIM) 300 MG tablet Take 300 mg by mouth daily.   Yes [provider]  amoxicillin (AMOXIL) 500 MG capsule Take 2,000 mg by mouth See admin instructions. Take 2,000 mg by mouth one hour prior to dental procedures   Yes [provider]  B Complex Vitamins (VITAMIN B COMPLEX) TABS Take 1 tablet by mouth daily with breakfast.   Yes [provider]  bisacodyl (DULCOLAX) 5 MG EC tablet Take 10 mg by mouth daily as needed (for constipation).   Yes [provider]  calcium carbonate (TUMS - DOSED  IN MG ELEMENTAL CALCIUM) 500 MG chewable tablet Chew 2 tablets by mouth every 6 (six) hours as needed for indigestion or heartburn (MAX OF 8 TABLETS IN 24 HOURS).   Yes [provider]  carvedilol (COREG) 3.125 MG tablet Take 3.125 mg by mouth 2 (two) times daily with a meal.   Yes [provider]  Cholecalciferol (VITAMIN D3) 1000 units CAPS Take 1,000 Units by mouth in the morning.   Yes [provider]  diltiazem (CARDIZEM CD) 180 MG 24 hr capsule Take 180 mg by mouth daily.   Yes [provider]  Emollient (CETAPHIL MOISTURIZING EX) Apply 1 application  topically See admin instructions. Apply to the body 2 times a day   Yes [provider]  FEROSUL 325 (65 Fe) MG tablet Take 325 mg by mouth daily with breakfast.   Yes [provider]  furosemide (LASIX) 20 MG tablet Take 1 tablet (20 mg total) by mouth daily as needed for edema or fluid (Weight gain of 3 pounds in 1 day or 5 pounds in 1 week). 06/13/23 06/12/24 Yes [name], [name], DO  losartan (COZAAR) 100 MG tablet Take 1 tablet (100 mg total) by mouth daily. 06/13/23 09/11/23 Yes [name], [name], DO  melatonin 3 MG TABS tablet Take 1 tablet (3 mg total) by mouth at bedtime as needed (insomnia). Patient taking differently: Take 3 mg by mouth at bedtime. 12/10/22  Yes [name], MD  mirtazapine (REMERON) 15 MG tablet Take 1 tablet (15 mg total) by mouth at bedtime. 06/13/23 09/11/23 Yes [name], [name], DO  NON FORMULARY Take 3 capsules by mouth See admin instructions. Whole produce "FRUIT" and "VEGGIES" capsules- Take 3 capsules of each formulation by mouth in the morning   Yes [provider]  NON FORMULARY Take 4 oz by mouth See admin instructions. PRUNE JUICE- Drink 4 ounces of prune juice by mouth in the morning   Yes [provider]  OVER THE COUNTER MEDICATION Take 1 Scoop by mouth See admin instructions. AG1 Athletic Greens packets- Mix the contents of one packet into  8 ounces of water and drink by mouth once a day   Yes [provider]  OXYGEN Inhale 2 L/min into the lungs at bedtime. 2 L/min at bedtime AND as needed for shortness of breath   Yes [provider]  Skin Protectants, Misc. (MINERIN CREME) CREA Apply 1 Application topically See admin instructions. Apply to lower extremities twice a day   Yes [provider]  sodium chloride 1 g tablet Take 2 tablets (2 g total) by mouth 2 (two) times daily with a meal. 06/13/23 09/11/23 Yes [name], [name], DO  triamcinolone cream (KENALOG) 0.1 % Apply topically 3 (three) times  daily. To areas of psoariatic rash. Do not apply to face. 10/25/22  Yes Johney Maine, MD  colchicine 0.6 MG tablet Take 0.6 mg by mouth every other day. Patient not taking: Reported on 06/28/2023    [provider]    Scheduled Meds:  sodium chloride   Intravenous Once   allopurinol  300 mg Oral Daily   carvedilol  3.125 mg Oral BID WC   diltiazem  180 mg Oral Daily   ferrous sulfate  325 mg Oral Q breakfast   furosemide  20 mg Oral Daily   [START ON 06/29/2023] losartan  100 mg Oral Daily   mirtazapine  15 mg Oral QHS   pantoprazole (PROTONIX) IV  40 mg Intravenous Q12H   sodium chloride flush  3 mL Intravenous Q12H   sodium chloride flush  3 mL Intravenous Q12H   sodium chloride  2 g Oral BID WC   Continuous Infusions:  sodium chloride     PRN Meds:.sodium chloride, acetaminophen **OR** acetaminophen, bisacodyl, calcium carbonate, hydrALAZINE, ipratropium-albuterol, ondansetron **OR** ondansetron (ZOFRAN) IV, sodium chloride flush  Allergies as of 06/27/2023 - Review Complete 06/27/2023  Allergen Reaction Noted   Cephalexin Other (See Comments) 05/08/2020   Shellfish-derived products Hives and Rash 03/20/2011   Tape Other (See Comments) 11/12/2016   Clindamycin/lincomycin Rash 02/17/2012   Lincomycin Rash 02/17/2012   Clindamycin hcl Other (See Comments) 04/07/2022   Fosfomycin Other  (See Comments) 04/07/2022   Levofloxacin Other (See Comments) 04/07/2022   Other Other (See Comments) 05/08/2020   Penicillamine Other (See Comments) 06/14/2018   Prednisone Other (See Comments) 05/20/2023   Sugar-protein-starch Other (See Comments) 11/12/2016   Ciprofloxacin Rash 11/21/2012   Sulfa antibiotics Hives and Rash 03/20/2011   Sulfites Hives, Swelling, and Rash 11/21/2012    Family History  Problem Relation Age of Onset   Hypertension Mother    Stroke Mother    Heart attack Father 20   Heart failure Father    Heart failure Sister    Aortic aneurysm Sister    Aortic aneurysm Sister    Stroke Sister    Pneumonia Brother    Stroke Brother    ALS Other    Hyperlipidemia Daughter    Aortic aneurysm Son    Breast cancer Neg Hx     Social History   Socioeconomic History   Marital status: Widowed    Spouse name: Not on file   Number of children: Not on file   Years of education: Not on file   Highest education level: Not on file  Occupational History   Not on file  Tobacco Use   Smoking status: Former    Current packs/day: 0.00    Types: Cigarettes    Start date: 28    Quit date: 64    Years since quitting: 60.3   Smokeless tobacco: Never   Tobacco comments:    quit age 36  Vaping Use   Vaping status: Never Used  Substance and Sexual Activity   Alcohol use: No    Alcohol/week: 0.0 standard drinks of alcohol   Drug use: Not Currently   Sexual activity: Not Currently    Birth control/protection: Surgical    Comment: HYST-1st intercourse 89 yo-Fewer than 5 partners  Other Topics Concern   Not on file  Social History Narrative   Diet: very good      Caffeine: 1 1/2 cup coffee (usually decaf)      Married, if yes what year: Widow, [phone]  Do you live in a house, apartment, assisted living, condo, trailer, ect: [place], independent living      Is it one or more stories: 2nd level. elevate      How many persons live in your home? 1       Pets: No      Highest level or education completed: 14      Current/Past profession: Health and safety inspector      Exercise:  Yes                Type and how often: aerobics twice daily         Living Will: Yes   DNR: Yes   POA/HPOA: Yes      Functional Status:   Do you have difficulty bathing or dressing yourself? No   Do you have difficulty preparing food or eating? No   Do you have difficulty managing your medications? No   Do you have difficulty managing your finances? No   Do you have difficulty affording your medications? No   Social Drivers of Corporate investment banker Strain: Not on file  Food Insecurity: No Food Insecurity (06/08/2023)   Hunger Vital Sign    Worried About Running Out of Food in the Last Year: Never true    Ran Out of Food in the Last Year: Never true  Transportation Needs: No Transportation Needs (06/08/2023)   PRAPARE - Administrator, Civil Service (Medical): No    Lack of Transportation (Non-Medical): No  Physical Activity: Not on file  Stress: Not on file  Social Connections: Unknown (06/08/2023)   Social Connection and Isolation Panel [NHANES]    Frequency of Communication with Friends and Family: Never    Frequency of Social Gatherings with Friends and Family: Patient declined    Attends Religious Services: Patient declined    Database administrator or Organizations: Patient declined    Attends Banker Meetings: Patient declined    Marital Status: Patient declined  Intimate Partner Violence: Not At Risk (06/08/2023)   Humiliation, Afraid, Rape, and Kick questionnaire    Fear of Current or Ex-Partner: No    Emotionally Abused: No    Physically Abused: No    Sexually Abused: No    Review of Systems: All negative except as stated above in HPI.  Physical Exam: Vital signs: Vitals:   06/28/23 1300 06/28/23 1355  BP: (!) 166/89   Pulse: 75   Resp: 16   Temp:  97.9 F (36.6 C)  SpO2: 100%      General:    Lethargic, elderly, thin, no acute distress, pleasant  Head: normocephalic, atraumatic Eyes: anicteric sclera ENT: oropharynx clear Neck: supple, nontender Lungs:  Clear throughout to auscultation.   No wheezes, crackles, or rhonchi. No acute distress. Heart:  Regular rate and rhythm; no murmurs, clicks, rubs,  or gallops. Abdomen: soft, nontender, nondistended, +BS  Rectal:  Deferred Ext: no edema  GI:  Lab Results: Recent Labs    06/27/23 2123 06/28/23 0556  WBC 6.5 8.1  HGB 7.6* 10.0*  HCT 24.8* 31.2*  PLT 306 335   BMET Recent Labs    06/27/23 2123 06/28/23 0556  NA 133* 134*  K 4.9 4.2  CL 104 100  CO2 24 25  GLUCOSE 95 83  BUN 30* 27*  CREATININE 1.01* 1.06*  CALCIUM 8.4* 8.9   LFT Recent Labs    06/28/23 0556  PROT 7.6  ALBUMIN 2.9*  AST 29  ALT 20  ALKPHOS 63  BILITOT 0.8   PT/INR No results for input(s): "LABPROT", "INR" in the last 72 hours.   Studies/Results: DG Chest Portable 1 View Result Date: 06/27/2023 CLINICAL DATA:  Shortness of breath EXAM: PORTABLE CHEST 1 VIEW COMPARISON:  06/07/2023 FINDINGS: Heart and mediastinal contours within normal limits. Chronic reticulonodular densities throughout the lungs compatible with chronic lung disease, unchanged. No definite acute process. No effusions or acute bony abnormality. IMPRESSION: Stable diffuse chronic lung disease.  No active disease. Electronically Signed   By: Janeece Mechanic M.D.   On: 06/27/2023 22:25    Impression/Plan: Symptomatic anemia with one episode of melena last week. Heme negative. EGD tomorrow (if informed consent obtained from family) to evaluate for peptic ulcer disease with drop in hemoglobin. PPI IV Q 12 hours. Change to clear liquid diet this evening. NPO p MN. Supportive care.     LOS: 0 days   Yvetta Herbert  06/28/2023, 4:33 PM  Questions please call [phone]

## 2023-06-28 NOTE — Consult Note (Incomplete)
 Consultation Note   Referring Provider:  ***[facility] PCP: Benedetta Bradley, MD Primary Gastroenterologist::          Reason for Consultation:  DOA: 06/27/2023         Hospital Day: 2   ASSESSMENT    89 y.o. year old female with a medical history including but not limited to interstitial lung disease on 2 L oxygen at home.  chronic CHF, aneurysm of the thoracic aorta, chronic hyponatremia, CKD stage II, essential hypertension, anemia of chronic disease, gout, dysphagia, chronic hypoxic respiratory failure 2 L oxygen    See PMH for additional history  Principal Problem:   Symptomatic anemia Active Problems:   Essential hypertension   Chronic heart failure with preserved ejection fraction (HFpEF) (HCC)   Chronic hyponatremia   Chronic diastolic CHF (congestive heart failure) (HCC)   Acute kidney injury superimposed on chronic kidney disease (HCC)   History of dysphagia   History of gout   Chronic hypoxic respiratory failure (HCC)   Hypertensive emergency      PLAN:   ***    HPI         Previous GI Studies          Labs and Imaging:  Recent Labs    06/27/23 2123 06/28/23 0556  WBC 6.5 8.1  HGB 7.6* 10.0*  HCT 24.8* 31.2*  MCV 84.9 83.9  PLT 306 335   No results for input(s): "FOLATE", "VITAMINB12", "FERRITIN", "TIBC", "IRONPCTSAT" in the last 72 hours. Recent Labs    06/27/23 2123 06/28/23 0556  NA 133* 134*  K 4.9 4.2  CL 104 100  CO2 24 25  GLUCOSE 95 83  BUN 30* 27*  CREATININE 1.01* 1.06*  CALCIUM 8.4* 8.9   Recent Labs    06/27/23 2123 06/28/23 0556  PROT 6.7 7.6  ALBUMIN 2.8* 2.9*  AST 32 29  ALT 19 20  ALKPHOS 64 63  BILITOT 0.4 0.8   No results for input(s): "INR" in the last 72 hours. No results for input(s): "AFPTUMOR" in the last 72 hours.  DG Chest Portable 1 View CLINICAL DATA:  Shortness of breath  EXAM: PORTABLE CHEST 1 VIEW  COMPARISON:   06/07/2023  FINDINGS: Heart and mediastinal contours within normal limits. Chronic reticulonodular densities throughout the lungs compatible with chronic lung disease, unchanged. No definite acute process. No effusions or acute bony abnormality.  IMPRESSION: Stable diffuse chronic lung disease.  No active disease.  Electronically Signed   By: Janeece Mechanic M.D.   On: 06/27/2023 22:25    Past Medical History:  Diagnosis Date   Anemia    CHRONIC   Aortic insufficiency    a. 2D echo 08/29/15: EF 55-60%, mild LVH, diastolic dysfunction, elevated LV filling pressure, mild AI, severe LAE, mild RAE, mild TR, dilated descending thoracic aorta just distal to the takeoff of the left subclavian, measuring 4.1 cm, no pericardial effusion.   Arthritis    OA-SOME BACK AND NECK PAIN,  GOES TO CHIROPRACTOR TWICE A MONTH;  HX OF JOINT REPLACEMENTS    Cancer (HCC)    SKIN CANCERS REMOVED FROM LEGS   CKD (chronic kidney disease), stage II    Depression    Dyspnea  Essential hypertension    Gait abnormality 09/08/2017   GERD (gastroesophageal reflux disease)    Low sodium levels    Myocarditis (HCC)    PSVT (paroxysmal supraventricular tachycardia) (HCC)    Recurrent Pericarditis    RECURRENT    Thoracic aneurysm    a. Followed by [facility] -last seen in 12/2013 w/ plan for f/u MRA and thoracic surgery f/u in 2 yrs, but patient does not wish to follow any longer.    Past Surgical History:  Procedure Laterality Date   APPENDECTOMY     BREAST EXCISIONAL BIOPSY Left 1992   benign   EXCISION/RELEASE BURSA HIP Left 11/24/2012   Procedure: LEFT HIP BURSECTOMY AND TENDON REPAIR ;  Surgeon: Loanne Drilling, MD;  Location: [facility];  Service: Orthopedics;  Laterality: Left;   HIP CLOSED REDUCTION Left 12/19/2012   Procedure: CLOSED MANIPULATION HIP;  Surgeon: Shelda Pal, MD;  Location: [facility];  Service: Orthopedics;  Laterality: Left;   HIP CLOSED REDUCTION Left 12/05/2013   Procedure: CLOSED  MANIPULATION HIP;  Surgeon: Verlee Rossetti, MD;  Location: [facility];  Service: Orthopedics;  Laterality: Left;   HIP CLOSED REDUCTION Left 09/22/2015   Procedure: CLOSED REDUCTION HIP;  Surgeon: Venita Lick, MD;  Location: [facility];  Service: Orthopedics;  Laterality: Left;   HIP CLOSED REDUCTION Left 12/05/2022   Procedure: CLOSED MANIPULATION HIP;  Surgeon: Ollen Gross, MD;  Location: [facility];  Service: Orthopedics;  Laterality: Left;   HIP CLOSED REDUCTION Left 04/12/2023   Procedure: CLOSED MANIPULATION HIP;  Surgeon: Toni Arthurs, MD;  Location: [facility];  Service: Orthopedics;  Laterality: Left;   JOINT REPLACEMENT     LEFT TOTAL HIP REPLACEMENT AND REVISIONS X 2   OOPHORECTOMY     has a partial of one ovary remaingin   PELVIC LAPAROSCOPY     ovarian cyst removal,    REVISION TOTAL HIP ARTHROPLASTY     left   right Achilles Tendon repair  5/13   TOTAL KNEE ARTHROPLASTY     right   TOTAL KNEE ARTHROPLASTY     left   VAGINAL HYSTERECTOMY     with ovarian cyst removal    Family History  Problem Relation Age of Onset   Hypertension Mother    Stroke Mother    Heart attack Father 71   Heart failure Father    Heart failure Sister    Aortic aneurysm Sister    Aortic aneurysm Sister    Stroke Sister    Pneumonia Brother    Stroke Brother    ALS Other    Hyperlipidemia Daughter    Aortic aneurysm Son    Breast cancer Neg Hx     Prior to Admission medications   Medication Sig Start Date End Date Taking? Authorizing Provider  acetaminophen (TYLENOL) 500 MG tablet Take 1,000 mg by mouth 3 (three) times daily as needed (for pain).   Yes [provider]  allopurinol (ZYLOPRIM) 300 MG tablet Take 300 mg by mouth daily.   Yes [provider]  amoxicillin (AMOXIL) 500 MG capsule Take 2,000 mg by mouth See admin instructions. Take 2,000 mg by mouth one hour prior to dental procedures   Yes [provider]  B Complex Vitamins (VITAMIN B COMPLEX) TABS Take 1  tablet by mouth daily with breakfast.   Yes [provider]  bisacodyl (DULCOLAX) 5 MG EC tablet Take 10 mg by mouth daily as needed (for constipation).   Yes [provider]  calcium carbonate (TUMS - DOSED IN MG ELEMENTAL CALCIUM) 500 MG chewable tablet Chew 2 tablets by mouth every 6 (six) hours as needed for indigestion or heartburn (MAX OF 8 TABLETS IN 24 HOURS).   Yes [provider]  carvedilol (COREG) 3.125 MG tablet Take 3.125 mg by mouth 2 (two) times daily with a meal.   Yes [provider]  Cholecalciferol (VITAMIN D3) 1000 units CAPS Take 1,000 Units by mouth in the morning.   Yes [provider]  diltiazem (CARDIZEM CD) 180 MG 24 hr capsule Take 180 mg by mouth daily.   Yes [provider]  Emollient (CETAPHIL MOISTURIZING EX) Apply 1 application  topically See admin instructions. Apply to the body 2 times a day   Yes [provider]  FEROSUL 325 (65 Fe) MG tablet Take 325 mg by mouth daily with breakfast.   Yes [provider]  furosemide (LASIX) 20 MG tablet Take 1 tablet (20 mg total) by mouth daily as needed for edema or fluid (Weight gain of 3 pounds in 1 day or 5 pounds in 1 week). 06/13/23 06/12/24 Yes [name], [name], DO  losartan (COZAAR) 100 MG tablet Take 1 tablet (100 mg total) by mouth daily. 06/13/23 09/11/23 Yes [name], [name], DO  melatonin 3 MG TABS tablet Take 1 tablet (3 mg total) by mouth at bedtime as needed (insomnia). Patient taking differently: Take 3 mg by mouth at bedtime. 12/10/22  Yes Rodolph Bong, MD  mirtazapine (REMERON) 15 MG tablet Take 1 tablet (15 mg total) by mouth at bedtime. 06/13/23 09/11/23 Yes [name], [name], DO  NON FORMULARY Take 3 capsules by mouth See admin instructions. Whole produce "FRUIT" and "VEGGIES" capsules- Take 3 capsules of each formulation by mouth in the morning   Yes [provider]  NON FORMULARY Take 4 oz by mouth See admin instructions. PRUNE  JUICE- Drink 4 ounces of prune juice by mouth in the morning   Yes [provider]  OVER THE COUNTER MEDICATION Take 1 Scoop by mouth See admin instructions. AG1 Athletic Greens packets- Mix the contents of one packet into 8 ounces of water and drink by mouth once a day   Yes [provider]  OXYGEN Inhale 2 L/min into the lungs at bedtime. 2 L/min at bedtime AND as needed for shortness of breath   Yes [provider]  Skin Protectants, Misc. (MINERIN CREME) CREA Apply 1 Application topically See admin instructions. Apply to lower extremities twice a day   Yes [provider]  sodium chloride 1 g tablet Take 2 tablets (2 g total) by mouth 2 (two) times daily with a meal. 06/13/23 09/11/23 Yes [name], [name], DO  triamcinolone cream (KENALOG) 0.1 % Apply topically 3 (three) times daily. To areas of psoariatic rash. Do not apply to face. 10/25/22  Yes Johney Maine, MD  colchicine 0.6 MG tablet Take 0.6 mg by mouth every other day. Patient not taking: Reported on 06/28/2023    [provider]    Current Facility-Administered Medications  Medication Dose Route Frequency Provider Last Rate Last Admin   0.9 %  sodium chloride infusion (Manually program via Guardrails IV Fluids)   Intravenous Once Janalyn Shy, Subrina, MD   Held at 06/28/23 0357   0.9 %  sodium chloride infusion  250 mL Intravenous PRN Sundil, Subrina, MD       acetaminophen (TYLENOL) tablet 650 mg  650 mg Oral Q6H PRN Tereasa Coop, MD  650 mg at 06/28/23 4540   Or   acetaminophen (TYLENOL) suppository 650 mg  650 mg Rectal Q6H PRN Sundil, Subrina, MD       allopurinol (ZYLOPRIM) tablet 300 mg  300 mg Oral Daily Sundil, Subrina, MD   300 mg at 06/28/23 1005   bisacodyl (DULCOLAX) EC tablet 10 mg  10 mg Oral Daily PRN Sundil, Subrina, MD       calcium carbonate (TUMS - dosed in mg elemental calcium) chewable tablet 400 mg of elemental calcium  2 tablet Oral Q6H PRN Sundil, Subrina, MD        carvedilol (COREG) tablet 3.125 mg  3.125 mg Oral BID WC Sundil, Subrina, MD   3.125 mg at 06/28/23 0802   diltiazem (CARDIZEM CD) 24 hr capsule 180 mg  180 mg Oral Daily Sundil, Subrina, MD   180 mg at 06/28/23 1020   ferrous sulfate tablet 325 mg  325 mg Oral Q breakfast Sundil, Subrina, MD   325 mg at 06/28/23 0802   furosemide (LASIX) tablet 20 mg  20 mg Oral Daily Sundil, Subrina, MD   20 mg at 06/28/23 1005   hydrALAZINE (APRESOLINE) injection 10 mg  10 mg Intravenous Q6H PRN Sundil, Subrina, MD   10 mg at 06/28/23 0041   ipratropium-albuterol (DUONEB) 0.5-2.5 (3) MG/3ML nebulizer solution 3 mL  3 mL Nebulization Q4H PRN Sundil, Subrina, MD   3 mL at 06/28/23 0042   [START ON 06/29/2023] losartan (COZAAR) tablet 100 mg  100 mg Oral Daily Sundil, Subrina, MD       mirtazapine (REMERON) tablet 15 mg  15 mg Oral QHS Sundil, Subrina, MD   15 mg at 06/28/23 0030   ondansetron (ZOFRAN) tablet 4 mg  4 mg Oral Q6H PRN Sundil, Subrina, MD       Or   ondansetron (ZOFRAN) injection 4 mg  4 mg Intravenous Q6H PRN Sundil, Subrina, MD       pantoprazole (PROTONIX) injection 40 mg  40 mg Intravenous Q12H Sundil, Subrina, MD   40 mg at 06/28/23 1005   sodium chloride flush (NS) 0.9 % injection 3 mL  3 mL Intravenous Q12H Sundil, Subrina, MD   3 mL at 06/28/23 0010   sodium chloride flush (NS) 0.9 % injection 3 mL  3 mL Intravenous Q12H Sundil, Subrina, MD   3 mL at 06/28/23 0010   sodium chloride flush (NS) 0.9 % injection 3 mL  3 mL Intravenous PRN Sundil, Subrina, MD       sodium chloride tablet 2 g  2 g Oral BID WC Sundil, Subrina, MD   2 g at 06/28/23 9811   Current Outpatient Medications  Medication Sig Dispense Refill   acetaminophen (TYLENOL) 500 MG tablet Take 1,000 mg by mouth 3 (three) times daily as needed (for pain).     allopurinol (ZYLOPRIM) 300 MG tablet Take 300 mg by mouth daily.     amoxicillin (AMOXIL) 500 MG capsule Take 2,000 mg by mouth See admin instructions. Take 2,000 mg by  mouth one hour prior to dental procedures     B Complex Vitamins (VITAMIN B COMPLEX) TABS Take 1 tablet by mouth daily with breakfast.     bisacodyl (DULCOLAX) 5 MG EC tablet Take 10 mg by mouth daily as needed (for constipation).     calcium carbonate (TUMS - DOSED IN MG ELEMENTAL CALCIUM) 500 MG chewable tablet Chew 2 tablets by mouth every 6 (six) hours as needed for indigestion or heartburn (MAX OF  8 TABLETS IN 24 HOURS).     carvedilol (COREG) 3.125 MG tablet Take 3.125 mg by mouth 2 (two) times daily with a meal.     Cholecalciferol (VITAMIN D3) 1000 units CAPS Take 1,000 Units by mouth in the morning.     diltiazem (CARDIZEM CD) 180 MG 24 hr capsule Take 180 mg by mouth daily.     Emollient (CETAPHIL MOISTURIZING EX) Apply 1 application  topically See admin instructions. Apply to the body 2 times a day     FEROSUL 325 (65 Fe) MG tablet Take 325 mg by mouth daily with breakfast.     furosemide (LASIX) 20 MG tablet Take 1 tablet (20 mg total) by mouth daily as needed for edema or fluid (Weight gain of 3 pounds in 1 day or 5 pounds in 1 week). 30 tablet 2   losartan (COZAAR) 100 MG tablet Take 1 tablet (100 mg total) by mouth daily. 90 tablet 0   melatonin 3 MG TABS tablet Take 1 tablet (3 mg total) by mouth at bedtime as needed (insomnia). (Patient taking differently: Take 3 mg by mouth at bedtime.)     mirtazapine (REMERON) 15 MG tablet Take 1 tablet (15 mg total) by mouth at bedtime. 90 tablet 0   NON FORMULARY Take 3 capsules by mouth See admin instructions. Whole produce "FRUIT" and "VEGGIES" capsules- Take 3 capsules of each formulation by mouth in the morning     NON FORMULARY Take 4 oz by mouth See admin instructions. PRUNE JUICE- Drink 4 ounces of prune juice by mouth in the morning     OVER THE COUNTER MEDICATION Take 1 Scoop by mouth See admin instructions. AG1 Athletic Greens packets- Mix the contents of one packet into 8 ounces of water and drink by mouth once a day     OXYGEN Inhale  2 L/min into the lungs at bedtime. 2 L/min at bedtime AND as needed for shortness of breath     Skin Protectants, Misc. (MINERIN CREME) CREA Apply 1 Application topically See admin instructions. Apply to lower extremities twice a day     sodium chloride 1 g tablet Take 2 tablets (2 g total) by mouth 2 (two) times daily with a meal. 360 tablet 0   triamcinolone cream (KENALOG) 0.1 % Apply topically 3 (three) times daily. To areas of psoariatic rash. Do not apply to face. 30 g 0   colchicine 0.6 MG tablet Take 0.6 mg by mouth every other day. (Patient not taking: Reported on 06/28/2023)      Allergies as of 06/27/2023 - Review Complete 06/27/2023  Allergen Reaction Noted   Cephalexin Other (See Comments) 05/08/2020   Shellfish-derived products Hives and Rash 03/20/2011   Tape Other (See Comments) 11/12/2016   Clindamycin/lincomycin Rash 02/17/2012   Lincomycin Rash 02/17/2012   Clindamycin hcl Other (See Comments) 04/07/2022   Fosfomycin Other (See Comments) 04/07/2022   Levofloxacin Other (See Comments) 04/07/2022   Other Other (See Comments) 05/08/2020   Penicillamine Other (See Comments) 06/14/2018   Prednisone Other (See Comments) 05/20/2023   Sugar-protein-starch Other (See Comments) 11/12/2016   Ciprofloxacin Rash 11/21/2012   Sulfa antibiotics Hives and Rash 03/20/2011   Sulfites Hives, Swelling, and Rash 11/21/2012    Social History   Socioeconomic History   Marital status: Widowed    Spouse name: Not on file   Number of children: Not on file   Years of education: Not on file   Highest education level: Not on file  Occupational  History   Not on file  Tobacco Use   Smoking status: Former    Current packs/day: 0.00    Types: Cigarettes    Start date: 72    Quit date: 1965    Years since quitting: 60.3   Smokeless tobacco: Never   Tobacco comments:    quit age 55  Vaping Use   Vaping status: Never Used  Substance and Sexual Activity   Alcohol use: No     Alcohol/week: 0.0 standard drinks of alcohol   Drug use: Not Currently   Sexual activity: Not Currently    Birth control/protection: Surgical    Comment: HYST-1st intercourse 89 yo-Fewer than 5 partners  Other Topics Concern   Not on file  Social History Narrative   Diet: very good      Caffeine: 1 1/2 cup coffee (usually decaf)      Married, if yes what year: Widow, 1954      Do you live in a house, apartment, assisted living, condo, trailer, ect: [facility], independent living      Is it one or more stories: 2nd level. elevate      How many persons live in your home? 1      Pets: No      Highest level or education completed: 14      Current/Past profession: Health and safety inspector      Exercise:  Yes                Type and how often: aerobics twice daily         Living Will: Yes   DNR: Yes   POA/HPOA: Yes      Functional Status:   Do you have difficulty bathing or dressing yourself? No   Do you have difficulty preparing food or eating? No   Do you have difficulty managing your medications? No   Do you have difficulty managing your finances? No   Do you have difficulty affording your medications? No   Social Drivers of Corporate investment banker Strain: Not on file  Food Insecurity: No Food Insecurity (06/08/2023)   Hunger Vital Sign    Worried About Running Out of Food in the Last Year: Never true    Ran Out of Food in the Last Year: Never true  Transportation Needs: No Transportation Needs (06/08/2023)   PRAPARE - Administrator, Civil Service (Medical): No    Lack of Transportation (Non-Medical): No  Physical Activity: Not on file  Stress: Not on file  Social Connections: Unknown (06/08/2023)   Social Connection and Isolation Panel [NHANES]    Frequency of Communication with Friends and Family: Never    Frequency of Social Gatherings with Friends and Family: Patient declined    Attends Religious Services: Patient declined    Doctor, general practice or Organizations: Patient declined    Attends Banker Meetings: Patient declined    Marital Status: Patient declined  Intimate Partner Violence: Not At Risk (06/08/2023)   Humiliation, Afraid, Rape, and Kick questionnaire    Fear of Current or Ex-Partner: No    Emotionally Abused: No    Physically Abused: No    Sexually Abused: No     Code Status   Code Status: Limited: Do not attempt resuscitation (DNR) -DNR-LIMITED -Do Not Intubate/DNI   Review of Systems: All systems reviewed and negative except where noted in HPI.  Physical Exam: Vital signs in last 24 hours: Temp:  Iker.Keens F (  36.7 C)-98.5 F (36.9 C)] 98 F (36.7 C) (04/15 0700) Pulse Rate:  [77-100] 97 (04/15 0700) Resp:  [12-20] 15 (04/15 0700) BP: (145-200)/(75-164) 145/84 (04/15 1020) SpO2:  [100 %] 100 % (04/15 0700) Weight:  [49.9 kg] 49.9 kg (04/15 0742)    General:  Pleasant ***female in NAD Psych:  Cooperative. Normal mood and affect Eyes: Pupils equal Ears:  Normal auditory acuity Nose: No deformity, discharge or lesions Neck:  Supple, no masses felt Lungs:  Clear to auscultation.  Heart:  Regular rate, regular rhythm.  Abdomen:  Soft, nondistended, nontender, active bowel sounds, no masses felt Rectal :  Deferred Msk: Symmetrical without gross deformities.  Neurologic:  Alert, oriented, grossly normal neurologically Extremities : No edema Skin:  Intact without significant lesions.    Intake/Output from previous day: 04/14 0701 - 04/15 0700 In: 314 [Blood:314] Out: -  Intake/Output this shift:  No intake/output data recorded.   Mai Schwalbe, NP-C   06/28/2023, 10:35 AM

## 2023-06-28 NOTE — Plan of Care (Signed)

## 2023-06-29 ENCOUNTER — Other Ambulatory Visit (HOSPITAL_COMMUNITY): Payer: Self-pay

## 2023-06-29 DIAGNOSIS — D649 Anemia, unspecified: Secondary | ICD-10-CM | POA: Diagnosis not present

## 2023-06-29 LAB — TYPE AND SCREEN
ABO/RH(D): AB POS
Antibody Screen: NEGATIVE
Unit division: 0

## 2023-06-29 LAB — CBC
HCT: 31.8 % — ABNORMAL LOW (ref 36.0–46.0)
Hemoglobin: 10.2 g/dL — ABNORMAL LOW (ref 12.0–15.0)
MCH: 27 pg (ref 26.0–34.0)
MCHC: 32.1 g/dL (ref 30.0–36.0)
MCV: 84.1 fL (ref 80.0–100.0)
Platelets: 358 10*3/uL (ref 150–400)
RBC: 3.78 MIL/uL — ABNORMAL LOW (ref 3.87–5.11)
RDW: 18.1 % — ABNORMAL HIGH (ref 11.5–15.5)
WBC: 6.9 10*3/uL (ref 4.0–10.5)
nRBC: 0 % (ref 0.0–0.2)

## 2023-06-29 LAB — BPAM RBC
Blood Product Expiration Date: 202505122359
ISSUE DATE / TIME: 202504150109
Unit Type and Rh: 6200

## 2023-06-29 SURGERY — EGD (ESOPHAGOGASTRODUODENOSCOPY)
Anesthesia: Monitor Anesthesia Care

## 2023-06-29 MED ORDER — PANTOPRAZOLE SODIUM 40 MG PO TBEC
40.0000 mg | DELAYED_RELEASE_TABLET | Freq: Every day | ORAL | 0 refills | Status: AC
  Filled 2023-06-29: qty 30, 30d supply, fill #0

## 2023-06-29 NOTE — TOC Transition Note (Signed)
 Transition of Care [facility]) - Discharge Note   Patient Details  Name: [name] MRN: [id number] Date of Birth: 10-Jul-1929  [facility] CM/SW Contact:  Eusebio High, RN Phone Number: 06/29/2023, 12:00 PM   Clinical Narrative:    Patient will DC to [facility] today  [facility] has been called           Patient Goals and CMS Choice            Discharge Placement                       Discharge Plan and Services Additional resources added to the After Visit Summary for                                       Social Drivers of Health (SDOH) Interventions SDOH Screenings   Food Insecurity: No Food Insecurity (06/28/2023)  Housing: Low Risk  (06/28/2023)  Transportation Needs: No Transportation Needs (06/28/2023)  Utilities: Not At Risk (06/28/2023)  Alcohol Screen: Low Risk  (01/19/2021)  Depression (PHQ2-9): Low Risk  (06/10/2021)  Social Connections: Unknown (06/28/2023)  Tobacco Use: Medium Risk (06/27/2023)     Readmission Risk Interventions    06/13/2023    2:28 PM 04/15/2023   12:50 PM  Readmission Risk Prevention Plan  Transportation Screening Complete Complete  PCP or Specialist Appt within 3-5 Days  Complete  HRI or Home Care Consult Complete Complete  Social Work Consult for Recovery Care Planning/Counseling Complete Complete  Palliative Care Screening Not Applicable Not Applicable  Medication Review Oceanographer) Complete Complete

## 2023-06-29 NOTE — TOC Transition Note (Addendum)
 Transition of Care [facility]) - Discharge Note   Patient Details  Name: [name] MRN: [id number] Date of Birth: August 21, 1929  [facility]) CM/SW Contact:  Jannice Mends, LCSW Phone Number: 06/29/2023, 12:37 PM   Clinical Narrative:    12:37pm-CSW spoke with RN, Maya Sparrow, at [facility]. She requested Fl2 and DC Summary. CSW emailed it securely as requested to [email or web address]. Provided RN with number to call report.   ALF requested time to review records. CSW cancelled [facility]. CSW updated patient's son, Harvest Lineman.  2:54 PM-RN Boukke stated patient can return. CSW made [facility] aware of need for transport from the DC lounge. CSW updated son and took hard copy of Fl2 to the DC lounge as requested by [employer].       Patient Goals and CMS Choice            Discharge Placement                       Discharge Plan and Services Additional resources added to the After Visit Summary for                                       Social Drivers of Health (SDOH) Interventions SDOH Screenings   Food Insecurity: No Food Insecurity (06/28/2023)  Housing: Low Risk  (06/28/2023)  Transportation Needs: No Transportation Needs (06/28/2023)  Utilities: Not At Risk (06/28/2023)  Alcohol Screen: Low Risk  (01/19/2021)  Depression (PHQ2-9): Low Risk  (06/10/2021)  Social Connections: Unknown (06/28/2023)  Tobacco Use: Medium Risk (06/27/2023)     Readmission Risk Interventions    06/13/2023    2:28 PM 04/15/2023   12:50 PM  Readmission Risk Prevention Plan  Transportation Screening Complete Complete  PCP or Specialist Appt within 3-5 Days  Complete  HRI or Home Care Consult Complete Complete  Social Work Consult for Recovery Care Planning/Counseling Complete Complete  Palliative Care Screening Not Applicable Not Applicable  Medication Review Oceanographer) Complete Complete

## 2023-06-29 NOTE — Discharge Instructions (Signed)
 Follow with Primary MD Benedetta Bradley, MD in 7 days    Activity: As tolerated with Full fall precautions use walker/cane & assistance as needed   Disposition ALF   Diet: Regular Diet   On your next visit with your primary care physician please Get Medicines reviewed and adjusted.   Please request your Prim.MD to go over all Hospital Tests and Procedure/Radiological results at the follow up, please get all Hospital records sent to your Prim MD by signing hospital release before you go home.   If you experience worsening of your admission symptoms, develop shortness of breath, life threatening emergency, suicidal or homicidal thoughts you must seek medical attention immediately by calling 911 or calling your MD immediately  if symptoms less severe.  You Must read complete instructions/literature along with all the possible adverse reactions/side effects for all the Medicines you take and that have been prescribed to you. Take any new Medicines after you have completely understood and accpet all the possible adverse reactions/side effects.   Do not drive, operating heavy machinery, perform activities at heights, swimming or participation in water activities or provide baby sitting services if your were admitted for syncope or siezures until you have seen by Primary MD or a Neurologist and advised to do so again.  Do not drive when taking Pain medications.    Do not take more than prescribed Pain, Sleep and Anxiety Medications  Special Instructions: If you have smoked or chewed Tobacco  in the last 2 yrs please stop smoking, stop any regular Alcohol  and or any Recreational drug use.  Wear Seat belts while driving.   Please note  You were cared for by a hospitalist during your hospital stay. If you have any questions about your discharge medications or the care you received while you were in the hospital after you are discharged, you can call the unit and asked to speak with  the hospitalist on call if the hospitalist that took care of you is not available. Once you are discharged, your primary care physician will handle any further medical issues. Please note that NO REFILLS for any discharge medications will be authorized once you are discharged, as it is imperative that you return to your primary care physician (or establish a relationship with a primary care physician if you do not have one) for your aftercare needs so that they can reassess your need for medications and monitor your lab values.

## 2023-06-29 NOTE — [place] FL2 (Signed)
 [place] MEDICAID FL2 LEVEL OF CARE FORM     IDENTIFICATION  Patient Name: [name] Birthdate: 14-Feb-1930 Sex: female Admission Date (Current Location): 06/27/2023  [facility] and [place] Number:  Producer, television/film/video and Address:  [facility], [address], [place], [place] [address]      Provider Number: [id number]  Attending Physician Name and Address:  [name], Leana Roe, MD  Relative Name and Phone Number:       Current Level of Care: Hospital Recommended Level of Care: Assisted Living Facility Prior Approval Number:    Date Approved/Denied:   PASRR Number:    Discharge Plan: Other (Comment) (ALF)    Current Diagnoses: Patient Active Problem List   Diagnosis Date Noted   Hypertensive emergency 06/28/2023   Symptomatic anemia 06/27/2023   Acute kidney injury superimposed on chronic kidney disease (HCC) 06/27/2023   History of dysphagia 06/27/2023   History of gout 06/27/2023   Chronic hypoxic respiratory failure (HCC) 06/27/2023   Hyponatremia 06/07/2023   Failure to thrive in adult 06/07/2023   Closed anterior dislocation of left hip (HCC) 04/12/2023   Closed dislocation of left hip, subsequent encounter 04/12/2023   Ulcer of foot, unspecified laterality, limited to breakdown of skin (HCC) 03/23/2023   Osteoarthritis of right glenohumeral joint 02/25/2023   Chronic diastolic CHF (congestive heart failure) (HCC) 12/09/2022   Acute encephalopathy 12/07/2022   Dehydration 12/07/2022   Acute prerenal azotemia 12/07/2022   Leukocytosis 12/07/2022   Chronic hyponatremia 12/07/2022   ILD (interstitial lung disease) (HCC) 10/20/2022   CAP (community acquired pneumonia) 06/12/2022   Chronic heart failure with preserved ejection fraction (HFpEF) (HCC) 04/07/2022   Advanced care planning/counseling discussion 04/07/2022   Chronic venous stasis dermatitis of both lower extremities 04/07/2022   Malaise and fatigue 07/15/2021   Pain  of left hip joint 05/20/2021   Primary osteoarthritis 04/20/2021   Inflammatory polyarthropathy (HCC) 04/20/2021   Hyperuricemia 04/20/2021   Generalized weakness 04/20/2021   Chronic kidney disease, stage 3 unspecified (HCC) 04/20/2021   Adjustment disorder with mixed disturbance of emotions and conduct 01/20/2021   Severe recurrent major depression without psychotic features (HCC) 01/19/2021   Pincer nail deformity 12/01/2020   Iron deficiency anemia 03/24/2020   Clavi 02/19/2020   Lumbar radiculopathy 06/17/2019   Pain due to onychomycosis of toenails of both feet 05/14/2019   Mixed incontinence 03/10/2019   Cervical spondylosis 06/07/2018   DDD (degenerative disc disease), cervical 05/20/2018   History of total knee replacement, left 04/13/2018   History of total knee replacement, right 04/13/2018   History of revision of total replacement of left hip joint 04/13/2018   Presence of left artificial knee joint 04/13/2018   Gait abnormality 09/08/2017   Chronic cystitis 07/05/2017   Dysuria 07/05/2017   Anemia of chronic disease 09/22/2015   h/o pericarditis    Essential hypertension    Aortic insufficiency    Posterior vitreous detachment, bilateral 06/05/2015   Left wrist pain 05/16/2015   Triquetral chip fracture, left, closed, initial encounter 05/16/2015   Primary osteoarthritis of first carpometacarpal joint of right hand 02/11/2015   Primary osteoarthritis of right wrist 02/11/2015   Arthropathy 11/25/2014   Multiple skin tears 11/25/2014   Hip dislocation, left (HCC) 12/05/2013   Trochanteric bursitis of left hip 11/24/2012   Aneurysm of thoracic aorta (HCC)    Nonexudative age-related macular degeneration 04/22/2011   Posterior capsular opacification, right 04/22/2011   Status post intraocular lens implant 04/22/2011  Orientation RESPIRATION BLADDER Height & Weight     Self, Time, Situation, Place  O2 (2L nasal cannula) Continent Weight: 110 lb (49.9  kg) Height:  5' 4.5" (163.8 cm)  BEHAVIORAL SYMPTOMS/MOOD NEUROLOGICAL BOWEL NUTRITION STATUS      Continent Diet (Regular)  AMBULATORY STATUS COMMUNICATION OF NEEDS Skin   Limited Assist Verbally Normal                       Personal Care Assistance Level of Assistance  Feeding, Bathing, Dressing Bathing Assistance: Limited assistance Feeding assistance: Independent Dressing Assistance: Limited assistance     Functional Limitations Info             SPECIAL CARE FACTORS FREQUENCY                       Contractures      Additional Factors Info  Code Status, Allergies Code Status Info: DNR Allergies Info: Cephalexin, Shellfish-derived Products, Tape, Lincomycin, Clindamycin Hcl, Fosfomycin, Levofloxacin, Other, Penicillamine, Prednisone, Sugar-protein-starch, Ciprofloxacin, Sulfa Antibiotics, Sulfites           Current Medications (06/29/2023):  This is the current hospital active medication list Current Facility-Administered Medications  Medication Dose Route Frequency Provider Last Rate Last Admin   acetaminophen (TYLENOL) tablet 650 mg  650 mg Oral Q6H PRN Janalyn Shy, Subrina, MD   650 mg at 06/28/23 0555   Or   acetaminophen (TYLENOL) suppository 650 mg  650 mg Rectal Q6H PRN Janalyn Shy, Subrina, MD       allopurinol (ZYLOPRIM) tablet 300 mg  300 mg Oral Daily Sundil, Subrina, MD   300 mg at 06/29/23 1610   bisacodyl (DULCOLAX) EC tablet 10 mg  10 mg Oral Daily PRN Janalyn Shy, Subrina, MD       calcium carbonate (TUMS - dosed in mg elemental calcium) chewable tablet 400 mg of elemental calcium  2 tablet Oral Q6H PRN Janalyn Shy, Subrina, MD       carvedilol (COREG) tablet 3.125 mg  3.125 mg Oral BID WC Sundil, Subrina, MD   3.125 mg at 06/29/23 0916   diltiazem (CARDIZEM CD) 24 hr capsule 180 mg  180 mg Oral Daily Sundil, Subrina, MD   180 mg at 06/29/23 0916   ferrous sulfate tablet 325 mg  325 mg Oral Q breakfast Janalyn Shy, Subrina, MD   325 mg at 06/29/23 0916   furosemide  (LASIX) tablet 20 mg  20 mg Oral Daily Sundil, Subrina, MD   20 mg at 06/29/23 0916   hydrALAZINE (APRESOLINE) injection 10 mg  10 mg Intravenous Q6H PRN Janalyn Shy, Subrina, MD   10 mg at 06/29/23 0402   ipratropium-albuterol (DUONEB) 0.5-2.5 (3) MG/3ML nebulizer solution 3 mL  3 mL Nebulization Q4H PRN Janalyn Shy, Subrina, MD   3 mL at 06/28/23 0042   losartan (COZAAR) tablet 100 mg  100 mg Oral Daily Sundil, Subrina, MD   100 mg at 06/29/23 0917   mirtazapine (REMERON) tablet 15 mg  15 mg Oral QHS Sundil, Subrina, MD   15 mg at 06/28/23 0030   ondansetron (ZOFRAN) tablet 4 mg  4 mg Oral Q6H PRN Janalyn Shy, Subrina, MD       Or   ondansetron [facility]) injection 4 mg  4 mg Intravenous Q6H PRN Janalyn Shy, Subrina, MD       pantoprazole (PROTONIX) injection 40 mg  40 mg Intravenous Q12H Sundil, Subrina, MD   40 mg at 06/29/23 0917   sodium chloride flush (NS) 0.9 %  injection 3 mL  3 mL Intravenous Q12H Sundil, Subrina, MD   3 mL at 06/29/23 0917   sodium chloride flush (NS) 0.9 % injection 3 mL  3 mL Intravenous Q12H Sundil, Subrina, MD   3 mL at 06/29/23 0917   sodium chloride flush (NS) 0.9 % injection 3 mL  3 mL Intravenous PRN Sundil, Subrina, MD       sodium chloride tablet 2 g  2 g Oral BID WC Sundil, Subrina, MD   2 g at 06/29/23 1610     Discharge Medications: Please see discharge summary for a list of discharge medications.  Relevant Imaging Results:  Relevant Lab Results:   Additional Information SSN: [id number]  Bryttney Netzer S Merrill Deanda, LCSW

## 2023-06-29 NOTE — Discharge Summary (Signed)
 Physician Discharge Summary  [name] ZOX:[id number] DOB: 06-Sep-1929 DOA: 06/27/2023  PCP: Emilio Aspen, MD  Admit date: 06/27/2023 Discharge date: 06/29/2023  Admitted From: (ALF) Disposition:  (ALF)  Recommendations for Outpatient Follow-up:  Follow up with PCP in 1-2 weeks Please obtain BMP/CBC in one week  Diet recommendation:  Regular   CODE STATUS: DNR  Brief/Interim Summary: [name] is a 89 y.o. female with medical history significant of chronic interstitial lung disease on 2 L oxygen at baseline, aortic insufficiency, chronic CHF, aneurysm of the thoracic aorta, chronic hyponatremia, CKD stage II, essential hypertension, anemia of chronic disease, gout, dysphagia, chronic hypoxic respiratory failure 2 L oxygen at baseline, chronic physical deconditioning/debility and weakness due to advanced age and grade 2 diastolic heart failure with preserved EF 60 to 65% presenting to the emergency department complaining of shortness of breath.  Workup in ED was significant for low hemoglobin at 7.6, it was thought contributing to her dyspnea, was noted to have dark-colored stool, but she was Hemoccult negative, she is on iron pills, please see discussion below.Marland Kitchen  Dyspnea Failure to thrive -Patient main presentation secondary to dyspnea, this is most likely in the setting of her interstitial lung disease, as well anemia. - She is with known interstitial lung disease, which gives her poor life quality, I have discussed with the son, and they are considering to approach hospice palliative care consult patient with mother over next few days, so for now I will defer on initiating in this here in the hospital till family half-time talk to the patient about that as certainly she will benefit from hospice involvement to address her dyspnea.   Symptomatic anemia  Anemia of chronic kidney disease  - Patient presents with anemia, concern for GI bleed given melena 1 week prior  to admission, but Hemoccult negative stool, GI has been consulted, they discussed with family endoscopy, but at this point family does not wish for any procedures especially is not going to alter outcome especially with her hemoglobin has remained stable with no evidence of GI bleed we will keep on 40 mg of Protonix, and continue with home oral iron. - Patient transfused 1 unit PRBC during hospital stay, hemoglobin remained stable.  Hypertensive emergency Essential hypertension Diastolic heart failure preserved EF 60 to 65% Aortic insufficiency -Blood pressure improved with treatment(nitro SL) -Downtrending as appropriate, continue carvedilol, furosemide, losartan   AKI on CKD stage II - Likely secondary to above, continue blood pressure control/follow anemia   History of gout - Continue allopurinol   Chronic hyponatremia -Continue home salt tab 2 g twice daily   Interstitial lung disease Chronic hypoxic respiratory failure 2 L oxygen baseline -Continue 2 L nasal cannula, her baseline -Nebs as needed   History of chronic dysphagia Speech previously evaluated, recommending regular diet with thin liquids Liquid Administration via: Cup;Straw Medication Administration: Other (Comment) (As tolerated) Supervision: Comment (Set up assisted patient able to feed herself) Compensations: Slow rate;Small sips/bites (Check for oral retention) Postural Changes: Seated upright at 90 degrees;Remain upright for at least 30 minutes after po intake.     Discharge Diagnoses:  Principal Problem:   Symptomatic anemia Active Problems:   Chronic heart failure with preserved ejection fraction (HFpEF) (HCC)   Hypertensive emergency   Essential hypertension   Chronic hyponatremia   Chronic diastolic CHF (congestive heart failure) (HCC)   Acute kidney injury superimposed on chronic kidney disease (HCC)   History of dysphagia   History of gout  Chronic hypoxic respiratory failure  [facility])    Discharge Instructions  Discharge Instructions     Diet - low sodium heart healthy   Complete by: As directed    Discharge instructions   Complete by: As directed    Follow with Primary MD Emilio Aspen, MD in 7 days    Activity: As tolerated with Full fall precautions use walker/cane & assistance as needed   Disposition ALF   Diet: Regular Diet   On your next visit with your primary care physician please Get Medicines reviewed and adjusted.   Please request your Prim.MD to go over all Hospital Tests and Procedure/Radiological results at the follow up, please get all Hospital records sent to your Prim MD by signing hospital release before you go home.   If you experience worsening of your admission symptoms, develop shortness of breath, life threatening emergency, suicidal or homicidal thoughts you must seek medical attention immediately by calling 911 or calling your MD immediately  if symptoms less severe.  You Must read complete instructions/literature along with all the possible adverse reactions/side effects for all the Medicines you take and that have been prescribed to you. Take any new Medicines after you have completely understood and accpet all the possible adverse reactions/side effects.   Do not drive, operating heavy machinery, perform activities at heights, swimming or participation in water activities or provide baby sitting services if your were admitted for syncope or siezures until you have seen by Primary MD or a Neurologist and advised to do so again.  Do not drive when taking Pain medications.    Do not take more than prescribed Pain, Sleep and Anxiety Medications  Special Instructions: If you have smoked or chewed Tobacco  in the last 2 yrs please stop smoking, stop any regular Alcohol  and or any Recreational drug use.  Wear Seat belts while driving.   Please note  You were cared for by a hospitalist during your hospital stay. If  you have any questions about your discharge medications or the care you received while you were in the hospital after you are discharged, you can call the unit and asked to speak with the hospitalist on call if the hospitalist that took care of you is not available. Once you are discharged, your primary care physician will handle any further medical issues. Please note that NO REFILLS for any discharge medications will be authorized once you are discharged, as it is imperative that you return to your primary care physician (or establish a relationship with a primary care physician if you do not have one) for your aftercare needs so that they can reassess your need for medications and monitor your lab values.   Increase activity slowly   Complete by: As directed       Allergies as of 06/29/2023       Reactions   Cephalexin Other (See Comments)   Pt states this caused eye problems and STROKE-LIKE SYMPTOMS!! Not listed on the [facility]   Shellfish-derived Products Hives, Rash   Not listed on the [facility]   Tape Other (See Comments)   Please use paper tape or Coban Wrap; patient's skin tears VERY easily!! Not listed on the [facility]   Lincomycin Rash   Not listed on the [facility]   Clindamycin Hcl Other (See Comments)   "Per pharmacist"   Fosfomycin Other (See Comments)   Disorientation Not listed on the [facility]   Levofloxacin Other (See Comments)   Reaction not recalled by  the patient Not listed on the [facility]   Other Other (See Comments)   "Steroids" = "Allergic," per [facility]   Penicillamine Other (See Comments)   Reaction not recalled by the patient Not listed on the [facility]   Prednisone Other (See Comments)   "Allergic," per [facility]   Sugar-protein-starch Other (See Comments)   Sugary foods cause legs to cramp   Ciprofloxacin Rash   Not listed on the [facility]   Sulfa Antibiotics Hives, Rash   Not listed on the [facility]   Sulfites Hives, Swelling, Rash   Not listed on the [facility]        Medication List     STOP taking these  medications    colchicine 0.6 MG tablet       TAKE these medications    acetaminophen 500 MG tablet Commonly known as: TYLENOL Take 1,000 mg by mouth 3 (three) times daily as needed (for pain).   allopurinol 300 MG tablet Commonly known as: ZYLOPRIM Take 300 mg by mouth daily.   amoxicillin 500 MG capsule Commonly known as: AMOXIL Take 2,000 mg by mouth See admin instructions. Take 2,000 mg by mouth one hour prior to dental procedures   bisacodyl 5 MG EC tablet Commonly known as: DULCOLAX Take 10 mg by mouth daily as needed (for constipation).   calcium carbonate 500 MG chewable tablet Commonly known as: TUMS - dosed in mg elemental calcium Chew 2 tablets by mouth every 6 (six) hours as needed for indigestion or heartburn (MAX OF 8 TABLETS IN 24 HOURS).   carvedilol 3.125 MG tablet Commonly known as: COREG Take 3.125 mg by mouth 2 (two) times daily with a meal.   CETAPHIL MOISTURIZING EX Apply 1 application  topically See admin instructions. Apply to the body 2 times a day   diltiazem 180 MG 24 hr capsule Commonly known as: CARDIZEM CD Take 180 mg by mouth daily.   FeroSul 325 (65 Fe) MG tablet Generic drug: ferrous sulfate Take 325 mg by mouth daily with breakfast.   furosemide 20 MG tablet Commonly known as: Lasix Take 1 tablet (20 mg total) by mouth daily as needed for edema or fluid (Weight gain of 3 pounds in 1 day or 5 pounds in 1 week).   losartan 100 MG tablet Commonly known as: COZAAR Take 1 tablet (100 mg total) by mouth daily.   melatonin 3 MG Tabs tablet Take 1 tablet (3 mg total) by mouth at bedtime as needed (insomnia). What changed: when to take this   Minerin Creme Crea Apply 1 Application topically See admin instructions. Apply to lower extremities twice a day   mirtazapine 15 MG tablet Commonly known as: REMERON Take 1 tablet (15 mg total) by mouth at bedtime.   NON FORMULARY Take 3 capsules by mouth See admin instructions. Whole  produce "FRUIT" and "VEGGIES" capsules- Take 3 capsules of each formulation by mouth in the morning   NON FORMULARY Take 4 oz by mouth See admin instructions. PRUNE JUICE- Drink 4 ounces of prune juice by mouth in the morning   OVER THE COUNTER MEDICATION Take 1 Scoop by mouth See admin instructions. AG1 Athletic Greens packets- Mix the contents of one packet into 8 ounces of water and drink by mouth once a day   OXYGEN Inhale 2 L/min into the lungs at bedtime. 2 L/min at bedtime AND as needed for shortness of breath   pantoprazole 40 MG tablet Commonly known as: Protonix Take 1 tablet (40 mg total) by  mouth daily.   sodium chloride 1 g tablet Take 2 tablets (2 g total) by mouth 2 (two) times daily with a meal.   triamcinolone cream 0.1 % Commonly known as: KENALOG Apply topically 3 (three) times daily. To areas of psoariatic rash. Do not apply to face.   Vitamin B Complex Tabs Take 1 tablet by mouth daily with breakfast.   Vitamin D3 1000 units Caps Take 1,000 Units by mouth in the morning.        Allergies  Allergen Reactions   Cephalexin Other (See Comments)    Pt states this caused eye problems and STROKE-LIKE SYMPTOMS!!  Not listed on the [facility]   Shellfish-Derived Products Hives and Rash    Not listed on the [facility]   Tape Other (See Comments)    Please use paper tape or Coban Wrap; patient's skin tears VERY easily!!  Not listed on the [facility]   Lincomycin Rash    Not listed on the [facility]   Clindamycin Hcl Other (See Comments)    "Per pharmacist"   Fosfomycin Other (See Comments)    Disorientation   Not listed on the [facility]   Levofloxacin Other (See Comments)    Reaction not recalled by the patient  Not listed on the [facility]   Other Other (See Comments)    "Steroids" = "Allergic," per [facility]   Penicillamine Other (See Comments)    Reaction not recalled by the patient  Not listed on the [facility]   Prednisone Other (See Comments)    "Allergic," per [facility]   Sugar-Protein-Starch  Other (See Comments)    Sugary foods cause legs to cramp   Ciprofloxacin Rash    Not listed on the [facility]   Sulfa Antibiotics Hives and Rash    Not listed on the [facility]   Sulfites Hives, Swelling and Rash    Not listed on the [facility]    Consultations: GI   Procedures/Studies: DG Chest Portable 1 View Result Date: 06/27/2023 CLINICAL DATA:  Shortness of breath EXAM: PORTABLE CHEST 1 VIEW COMPARISON:  06/07/2023 FINDINGS: Heart and mediastinal contours within normal limits. Chronic reticulonodular densities throughout the lungs compatible with chronic lung disease, unchanged. No definite acute process. No effusions or acute bony abnormality. IMPRESSION: Stable diffuse chronic lung disease.  No active disease. Electronically Signed   By: Charlett Nose M.D.   On: 06/27/2023 22:25   DG Chest Portable 1 View Result Date: 06/07/2023 CLINICAL DATA:  Shortness of breath. EXAM: PORTABLE CHEST 1 VIEW COMPARISON:  March 28, 2023. FINDINGS: Stable cardiomediastinal silhouette. Stable diffuse reticular densities are noted most consistent with chronic interstitial lung disease. No definite acute abnormality is noted. Bony thorax is unremarkable. IMPRESSION: Stable diffuse chronic lung opacities are noted most consistent with chronic interstitial lung disease. Electronically Signed   By: Lupita Raider M.D.   On: 06/07/2023 15:49      Subjective: She denies any complaints, no nausea, no vomiting, wants to go back to ALF today, wanting her diet advanced to regular  Discharge Exam: Vitals:   06/29/23 0741 06/29/23 0800  BP:  (!) 141/75  Pulse:  89  Resp:    Temp:    SpO2: 99% 100%   Vitals:   06/29/23 0352 06/29/23 0450 06/29/23 0741 06/29/23 0800  BP:  (!) 143/82  (!) 141/75  Pulse: 80 88  89  Resp:  15    Temp: (!) 97.1 F (36.2 C)     TempSrc: Oral     SpO2:  99% 99% 100%  Weight:      Height:        General: Pt is alert, awake, not in acute distress, frail Cardiovascular: RRR, S1/S2 +, no  rubs, no gallops Respiratory: CTA bilaterally, no wheezing, no rhonchi Abdominal: Soft, NT, ND, bowel sounds + Extremities: no edema, no cyanosis    The results of significant diagnostics from this hospitalization (including imaging, microbiology, ancillary and laboratory) are listed below for reference.     Microbiology: Recent Results (from the past 240 hours)  Resp panel by RT-PCR (RSV, Flu A&B, Covid) Anterior Nasal Swab     Status: None   Collection Time: 06/27/23 10:27 PM   Specimen: Anterior Nasal Swab  Result Value Ref Range Status   SARS Coronavirus 2 by RT PCR NEGATIVE NEGATIVE Final   Influenza A by PCR NEGATIVE NEGATIVE Final   Influenza B by PCR NEGATIVE NEGATIVE Final    Comment: (NOTE) The Xpert Xpress SARS-CoV-2/FLU/RSV plus assay is intended as an aid in the diagnosis of influenza from Nasopharyngeal swab specimens and should not be used as a sole basis for treatment. Nasal washings and aspirates are unacceptable for Xpert Xpress SARS-CoV-2/FLU/RSV testing.  Fact Sheet for Patients: BloggerCourse.com  Fact Sheet for Healthcare Providers: SeriousBroker.it  This test is not yet approved or cleared by the [place]  FDA and has been authorized for detection and/or diagnosis of SARS-CoV-2 by FDA under an Emergency Use Authorization (EUA). This EUA will remain in effect (meaning this test can be used) for the duration of the COVID-19 declaration under Section 564(b)(1) of the Act, 21 U.S.C. section 360bbb-3(b)(1), unless the authorization is terminated or revoked.     Resp Syncytial Virus by PCR NEGATIVE NEGATIVE Final    Comment: (NOTE) Fact Sheet for Patients: BloggerCourse.com  Fact Sheet for Healthcare Providers: SeriousBroker.it  This test is not yet approved or cleared by the [place]  FDA and has been authorized for detection and/or diagnosis of  SARS-CoV-2 by FDA under an Emergency Use Authorization (EUA). This EUA will remain in effect (meaning this test can be used) for the duration of the COVID-19 declaration under Section 564(b)(1) of the Act, 21 U.S.C. section 360bbb-3(b)(1), unless the authorization is terminated or revoked.  Performed at [facility], [address] [place], [place] [address]      Labs: BNP (last 3 results) Recent Labs    06/27/23 2123  BNP 73.5   Basic Metabolic Panel: Recent Labs  Lab 06/27/23 2123 06/28/23 0556  NA 133* 134*  K 4.9 4.2  CL 104 100  CO2 24 25  GLUCOSE 95 83  BUN 30* 27*  CREATININE 1.01* 1.06*  CALCIUM 8.4* 8.9   Liver Function Tests: Recent Labs  Lab 06/27/23 2123 06/28/23 0556  AST 32 29  ALT 19 20  ALKPHOS 64 63  BILITOT 0.4 0.8  PROT 6.7 7.6  ALBUMIN 2.8* 2.9*   No results for input(s): "LIPASE", "AMYLASE" in the last 168 hours. No results for input(s): "AMMONIA" in the last 168 hours. CBC: Recent Labs  Lab 06/27/23 2123 06/28/23 0556 06/29/23 0359  WBC 6.5 8.1 6.9  NEUTROABS 3.7  --   --   HGB 7.6* 10.0* 10.2*  HCT 24.8* 31.2* 31.8*  MCV 84.9 83.9 84.1  PLT 306 335 358   Cardiac Enzymes: No results for input(s): "CKTOTAL", "CKMB", "CKMBINDEX", "TROPONINI" in the last 168 hours. BNP: Invalid input(s): "POCBNP" CBG: No results for input(s): "GLUCAP" in the last 168 hours. D-Dimer No  results for input(s): "DDIMER" in the last 72 hours. Hgb A1c No results for input(s): "HGBA1C" in the last 72 hours. Lipid Profile No results for input(s): "CHOL", "HDL", "LDLCALC", "TRIG", "CHOLHDL", "LDLDIRECT" in the last 72 hours. Thyroid function studies No results for input(s): "TSH", "T4TOTAL", "T3FREE", "THYROIDAB" in the last 72 hours.  Invalid input(s): "FREET3" Anemia work up No results for input(s): "VITAMINB12", "FOLATE", "FERRITIN", "TIBC", "IRON", "RETICCTPCT" in the last 72 hours. Urinalysis    Component Value Date/Time    COLORURINE YELLOW 06/28/2023 0033   APPEARANCEUR CLEAR 06/28/2023 0033   LABSPEC 1.012 06/28/2023 0033   PHURINE 6.0 06/28/2023 0033   GLUCOSEU NEGATIVE 06/28/2023 0033   HGBUR NEGATIVE 06/28/2023 0033   BILIRUBINUR NEGATIVE 06/28/2023 0033   KETONESUR NEGATIVE 06/28/2023 0033   PROTEINUR 30 (A) 06/28/2023 0033   UROBILINOGEN 0.2 04/17/2021 1314   NITRITE NEGATIVE 06/28/2023 0033   LEUKOCYTESUR NEGATIVE 06/28/2023 0033   Sepsis Labs Recent Labs  Lab 06/27/23 2123 06/28/23 0556 06/29/23 0359  WBC 6.5 8.1 6.9   Microbiology Recent Results (from the past 240 hours)  Resp panel by RT-PCR (RSV, Flu A&B, Covid) Anterior Nasal Swab     Status: None   Collection Time: 06/27/23 10:27 PM   Specimen: Anterior Nasal Swab  Result Value Ref Range Status   SARS Coronavirus 2 by RT PCR NEGATIVE NEGATIVE Final   Influenza A by PCR NEGATIVE NEGATIVE Final   Influenza B by PCR NEGATIVE NEGATIVE Final    Comment: (NOTE) The Xpert Xpress SARS-CoV-2/FLU/RSV plus assay is intended as an aid in the diagnosis of influenza from Nasopharyngeal swab specimens and should not be used as a sole basis for treatment. Nasal washings and aspirates are unacceptable for Xpert Xpress SARS-CoV-2/FLU/RSV testing.  Fact Sheet for Patients: BloggerCourse.com  Fact Sheet for Healthcare Providers: SeriousBroker.it  This test is not yet approved or cleared by the [place]  FDA and has been authorized for detection and/or diagnosis of SARS-CoV-2 by FDA under an Emergency Use Authorization (EUA). This EUA will remain in effect (meaning this test can be used) for the duration of the COVID-19 declaration under Section 564(b)(1) of the Act, 21 U.S.C. section 360bbb-3(b)(1), unless the authorization is terminated or revoked.     Resp Syncytial Virus by PCR NEGATIVE NEGATIVE Final    Comment: (NOTE) Fact Sheet for  Patients: BloggerCourse.com  Fact Sheet for Healthcare Providers: SeriousBroker.it  This test is not yet approved or cleared by the [place]  FDA and has been authorized for detection and/or diagnosis of SARS-CoV-2 by FDA under an Emergency Use Authorization (EUA). This EUA will remain in effect (meaning this test can be used) for the duration of the COVID-19 declaration under Section 564(b)(1) of the Act, 21 U.S.C. section 360bbb-3(b)(1), unless the authorization is terminated or revoked.  Performed at [facility], [address], [place], [place] [address]      Time coordinating discharge: Over 30 minutes  SIGNED:   Seena Dadds, MD  [facility] 06/29/2023, 11:48 AM Pager   If 7PM-7AM, please contact night-coverage [email or web address] Password [email or web address]

## 2023-06-30 DIAGNOSIS — M6259 Muscle wasting and atrophy, not elsewhere classified, multiple sites: Secondary | ICD-10-CM | POA: Diagnosis not present

## 2023-06-30 DIAGNOSIS — R2689 Other abnormalities of gait and mobility: Secondary | ICD-10-CM | POA: Diagnosis not present

## 2023-06-30 DIAGNOSIS — M25552 Pain in left hip: Secondary | ICD-10-CM | POA: Diagnosis not present

## 2023-06-30 DIAGNOSIS — R2681 Unsteadiness on feet: Secondary | ICD-10-CM | POA: Diagnosis not present

## 2023-07-01 ENCOUNTER — Telehealth: Payer: Self-pay | Admitting: Hematology

## 2023-07-01 DIAGNOSIS — J84115 Respiratory bronchiolitis interstitial lung disease: Secondary | ICD-10-CM | POA: Diagnosis not present

## 2023-07-01 DIAGNOSIS — F03918 Unspecified dementia, unspecified severity, with other behavioral disturbance: Secondary | ICD-10-CM | POA: Diagnosis not present

## 2023-07-01 DIAGNOSIS — N182 Chronic kidney disease, stage 2 (mild): Secondary | ICD-10-CM | POA: Diagnosis not present

## 2023-07-01 DIAGNOSIS — E46 Unspecified protein-calorie malnutrition: Secondary | ICD-10-CM | POA: Diagnosis not present

## 2023-07-01 DIAGNOSIS — D649 Anemia, unspecified: Secondary | ICD-10-CM | POA: Diagnosis not present

## 2023-07-01 DIAGNOSIS — K219 Gastro-esophageal reflux disease without esophagitis: Secondary | ICD-10-CM | POA: Diagnosis not present

## 2023-07-01 DIAGNOSIS — M109 Gout, unspecified: Secondary | ICD-10-CM | POA: Diagnosis not present

## 2023-07-01 DIAGNOSIS — J9611 Chronic respiratory failure with hypoxia: Secondary | ICD-10-CM | POA: Diagnosis not present

## 2023-07-01 DIAGNOSIS — I5040 Unspecified combined systolic (congestive) and diastolic (congestive) heart failure: Secondary | ICD-10-CM | POA: Diagnosis not present

## 2023-07-01 DIAGNOSIS — I10 Essential (primary) hypertension: Secondary | ICD-10-CM | POA: Diagnosis not present

## 2023-07-01 NOTE — Telephone Encounter (Signed)
 Spoke with patient confirming upcoming appointment

## 2023-07-02 DIAGNOSIS — I5040 Unspecified combined systolic (congestive) and diastolic (congestive) heart failure: Secondary | ICD-10-CM | POA: Diagnosis not present

## 2023-07-02 DIAGNOSIS — J84115 Respiratory bronchiolitis interstitial lung disease: Secondary | ICD-10-CM | POA: Diagnosis not present

## 2023-07-02 DIAGNOSIS — I10 Essential (primary) hypertension: Secondary | ICD-10-CM | POA: Diagnosis not present

## 2023-07-02 DIAGNOSIS — J9611 Chronic respiratory failure with hypoxia: Secondary | ICD-10-CM | POA: Diagnosis not present

## 2023-07-02 DIAGNOSIS — N182 Chronic kidney disease, stage 2 (mild): Secondary | ICD-10-CM | POA: Diagnosis not present

## 2023-07-02 DIAGNOSIS — D649 Anemia, unspecified: Secondary | ICD-10-CM | POA: Diagnosis not present

## 2023-07-04 DIAGNOSIS — E871 Hypo-osmolality and hyponatremia: Secondary | ICD-10-CM | POA: Diagnosis not present

## 2023-07-04 DIAGNOSIS — I5040 Unspecified combined systolic (congestive) and diastolic (congestive) heart failure: Secondary | ICD-10-CM | POA: Diagnosis not present

## 2023-07-04 DIAGNOSIS — D649 Anemia, unspecified: Secondary | ICD-10-CM | POA: Diagnosis not present

## 2023-07-04 DIAGNOSIS — D508 Other iron deficiency anemias: Secondary | ICD-10-CM | POA: Diagnosis not present

## 2023-07-04 DIAGNOSIS — J9611 Chronic respiratory failure with hypoxia: Secondary | ICD-10-CM | POA: Diagnosis not present

## 2023-07-04 DIAGNOSIS — N182 Chronic kidney disease, stage 2 (mild): Secondary | ICD-10-CM | POA: Diagnosis not present

## 2023-07-04 DIAGNOSIS — I10 Essential (primary) hypertension: Secondary | ICD-10-CM | POA: Diagnosis not present

## 2023-07-04 DIAGNOSIS — J84115 Respiratory bronchiolitis interstitial lung disease: Secondary | ICD-10-CM | POA: Diagnosis not present

## 2023-07-05 DIAGNOSIS — I5040 Unspecified combined systolic (congestive) and diastolic (congestive) heart failure: Secondary | ICD-10-CM | POA: Diagnosis not present

## 2023-07-05 DIAGNOSIS — J84115 Respiratory bronchiolitis interstitial lung disease: Secondary | ICD-10-CM | POA: Diagnosis not present

## 2023-07-05 DIAGNOSIS — N182 Chronic kidney disease, stage 2 (mild): Secondary | ICD-10-CM | POA: Diagnosis not present

## 2023-07-05 DIAGNOSIS — I10 Essential (primary) hypertension: Secondary | ICD-10-CM | POA: Diagnosis not present

## 2023-07-05 DIAGNOSIS — D649 Anemia, unspecified: Secondary | ICD-10-CM | POA: Diagnosis not present

## 2023-07-05 DIAGNOSIS — J9611 Chronic respiratory failure with hypoxia: Secondary | ICD-10-CM | POA: Diagnosis not present

## 2023-07-06 ENCOUNTER — Other Ambulatory Visit: Payer: Self-pay | Admitting: Hematology

## 2023-07-06 DIAGNOSIS — D631 Anemia in chronic kidney disease: Secondary | ICD-10-CM

## 2023-07-06 DIAGNOSIS — D649 Anemia, unspecified: Secondary | ICD-10-CM | POA: Diagnosis not present

## 2023-07-06 DIAGNOSIS — N182 Chronic kidney disease, stage 2 (mild): Secondary | ICD-10-CM | POA: Diagnosis not present

## 2023-07-06 DIAGNOSIS — I10 Essential (primary) hypertension: Secondary | ICD-10-CM | POA: Diagnosis not present

## 2023-07-06 DIAGNOSIS — J9611 Chronic respiratory failure with hypoxia: Secondary | ICD-10-CM | POA: Diagnosis not present

## 2023-07-06 DIAGNOSIS — J84115 Respiratory bronchiolitis interstitial lung disease: Secondary | ICD-10-CM | POA: Diagnosis not present

## 2023-07-06 DIAGNOSIS — I5040 Unspecified combined systolic (congestive) and diastolic (congestive) heart failure: Secondary | ICD-10-CM | POA: Diagnosis not present

## 2023-07-07 ENCOUNTER — Encounter: Payer: Self-pay | Admitting: Hematology

## 2023-07-07 DIAGNOSIS — I5040 Unspecified combined systolic (congestive) and diastolic (congestive) heart failure: Secondary | ICD-10-CM | POA: Diagnosis not present

## 2023-07-07 DIAGNOSIS — J84115 Respiratory bronchiolitis interstitial lung disease: Secondary | ICD-10-CM | POA: Diagnosis not present

## 2023-07-07 DIAGNOSIS — I10 Essential (primary) hypertension: Secondary | ICD-10-CM | POA: Diagnosis not present

## 2023-07-07 DIAGNOSIS — N182 Chronic kidney disease, stage 2 (mild): Secondary | ICD-10-CM | POA: Diagnosis not present

## 2023-07-07 DIAGNOSIS — J9611 Chronic respiratory failure with hypoxia: Secondary | ICD-10-CM | POA: Diagnosis not present

## 2023-07-07 DIAGNOSIS — D631 Anemia in chronic kidney disease: Secondary | ICD-10-CM | POA: Insufficient documentation

## 2023-07-07 DIAGNOSIS — D649 Anemia, unspecified: Secondary | ICD-10-CM | POA: Diagnosis not present

## 2023-07-08 DIAGNOSIS — J9611 Chronic respiratory failure with hypoxia: Secondary | ICD-10-CM | POA: Diagnosis not present

## 2023-07-08 DIAGNOSIS — D649 Anemia, unspecified: Secondary | ICD-10-CM | POA: Diagnosis not present

## 2023-07-08 DIAGNOSIS — N182 Chronic kidney disease, stage 2 (mild): Secondary | ICD-10-CM | POA: Diagnosis not present

## 2023-07-08 DIAGNOSIS — I10 Essential (primary) hypertension: Secondary | ICD-10-CM | POA: Diagnosis not present

## 2023-07-08 DIAGNOSIS — J84115 Respiratory bronchiolitis interstitial lung disease: Secondary | ICD-10-CM | POA: Diagnosis not present

## 2023-07-08 DIAGNOSIS — I5040 Unspecified combined systolic (congestive) and diastolic (congestive) heart failure: Secondary | ICD-10-CM | POA: Diagnosis not present

## 2023-07-11 DIAGNOSIS — I130 Hypertensive heart and chronic kidney disease with heart failure and stage 1 through stage 4 chronic kidney disease, or unspecified chronic kidney disease: Secondary | ICD-10-CM | POA: Diagnosis not present

## 2023-07-11 DIAGNOSIS — E871 Hypo-osmolality and hyponatremia: Secondary | ICD-10-CM | POA: Diagnosis not present

## 2023-07-11 DIAGNOSIS — M109 Gout, unspecified: Secondary | ICD-10-CM | POA: Diagnosis not present

## 2023-07-11 DIAGNOSIS — I509 Heart failure, unspecified: Secondary | ICD-10-CM | POA: Diagnosis not present

## 2023-07-12 ENCOUNTER — Inpatient Hospital Stay

## 2023-07-12 DIAGNOSIS — J9611 Chronic respiratory failure with hypoxia: Secondary | ICD-10-CM | POA: Diagnosis not present

## 2023-07-12 DIAGNOSIS — N182 Chronic kidney disease, stage 2 (mild): Secondary | ICD-10-CM | POA: Diagnosis not present

## 2023-07-12 DIAGNOSIS — D649 Anemia, unspecified: Secondary | ICD-10-CM | POA: Diagnosis not present

## 2023-07-12 DIAGNOSIS — I5040 Unspecified combined systolic (congestive) and diastolic (congestive) heart failure: Secondary | ICD-10-CM | POA: Diagnosis not present

## 2023-07-12 DIAGNOSIS — J84115 Respiratory bronchiolitis interstitial lung disease: Secondary | ICD-10-CM | POA: Diagnosis not present

## 2023-07-12 DIAGNOSIS — I10 Essential (primary) hypertension: Secondary | ICD-10-CM | POA: Diagnosis not present

## 2023-07-14 DIAGNOSIS — E46 Unspecified protein-calorie malnutrition: Secondary | ICD-10-CM | POA: Diagnosis not present

## 2023-07-14 DIAGNOSIS — J9611 Chronic respiratory failure with hypoxia: Secondary | ICD-10-CM | POA: Diagnosis not present

## 2023-07-14 DIAGNOSIS — N182 Chronic kidney disease, stage 2 (mild): Secondary | ICD-10-CM | POA: Diagnosis not present

## 2023-07-14 DIAGNOSIS — F03918 Unspecified dementia, unspecified severity, with other behavioral disturbance: Secondary | ICD-10-CM | POA: Diagnosis not present

## 2023-07-14 DIAGNOSIS — I10 Essential (primary) hypertension: Secondary | ICD-10-CM | POA: Diagnosis not present

## 2023-07-14 DIAGNOSIS — K219 Gastro-esophageal reflux disease without esophagitis: Secondary | ICD-10-CM | POA: Diagnosis not present

## 2023-07-14 DIAGNOSIS — J84115 Respiratory bronchiolitis interstitial lung disease: Secondary | ICD-10-CM | POA: Diagnosis not present

## 2023-07-14 DIAGNOSIS — M109 Gout, unspecified: Secondary | ICD-10-CM | POA: Diagnosis not present

## 2023-07-14 DIAGNOSIS — D649 Anemia, unspecified: Secondary | ICD-10-CM | POA: Diagnosis not present

## 2023-07-14 DIAGNOSIS — I5040 Unspecified combined systolic (congestive) and diastolic (congestive) heart failure: Secondary | ICD-10-CM | POA: Diagnosis not present

## 2023-07-15 DIAGNOSIS — I5040 Unspecified combined systolic (congestive) and diastolic (congestive) heart failure: Secondary | ICD-10-CM | POA: Diagnosis not present

## 2023-07-15 DIAGNOSIS — I10 Essential (primary) hypertension: Secondary | ICD-10-CM | POA: Diagnosis not present

## 2023-07-15 DIAGNOSIS — J9611 Chronic respiratory failure with hypoxia: Secondary | ICD-10-CM | POA: Diagnosis not present

## 2023-07-15 DIAGNOSIS — J84115 Respiratory bronchiolitis interstitial lung disease: Secondary | ICD-10-CM | POA: Diagnosis not present

## 2023-07-15 DIAGNOSIS — N182 Chronic kidney disease, stage 2 (mild): Secondary | ICD-10-CM | POA: Diagnosis not present

## 2023-07-15 DIAGNOSIS — D649 Anemia, unspecified: Secondary | ICD-10-CM | POA: Diagnosis not present

## 2023-07-19 ENCOUNTER — Inpatient Hospital Stay

## 2023-07-19 DIAGNOSIS — I10 Essential (primary) hypertension: Secondary | ICD-10-CM | POA: Diagnosis not present

## 2023-07-19 DIAGNOSIS — J84115 Respiratory bronchiolitis interstitial lung disease: Secondary | ICD-10-CM | POA: Diagnosis not present

## 2023-07-19 DIAGNOSIS — J9611 Chronic respiratory failure with hypoxia: Secondary | ICD-10-CM | POA: Diagnosis not present

## 2023-07-19 DIAGNOSIS — N182 Chronic kidney disease, stage 2 (mild): Secondary | ICD-10-CM | POA: Diagnosis not present

## 2023-07-19 DIAGNOSIS — D649 Anemia, unspecified: Secondary | ICD-10-CM | POA: Diagnosis not present

## 2023-07-19 DIAGNOSIS — I5040 Unspecified combined systolic (congestive) and diastolic (congestive) heart failure: Secondary | ICD-10-CM | POA: Diagnosis not present

## 2023-07-21 DIAGNOSIS — D649 Anemia, unspecified: Secondary | ICD-10-CM | POA: Diagnosis not present

## 2023-07-21 DIAGNOSIS — J9611 Chronic respiratory failure with hypoxia: Secondary | ICD-10-CM | POA: Diagnosis not present

## 2023-07-21 DIAGNOSIS — N182 Chronic kidney disease, stage 2 (mild): Secondary | ICD-10-CM | POA: Diagnosis not present

## 2023-07-21 DIAGNOSIS — I5040 Unspecified combined systolic (congestive) and diastolic (congestive) heart failure: Secondary | ICD-10-CM | POA: Diagnosis not present

## 2023-07-21 DIAGNOSIS — I10 Essential (primary) hypertension: Secondary | ICD-10-CM | POA: Diagnosis not present

## 2023-07-21 DIAGNOSIS — J84115 Respiratory bronchiolitis interstitial lung disease: Secondary | ICD-10-CM | POA: Diagnosis not present

## 2023-07-26 ENCOUNTER — Other Ambulatory Visit: Payer: Self-pay

## 2023-07-26 ENCOUNTER — Emergency Department (HOSPITAL_COMMUNITY): Admitting: Certified Registered Nurse Anesthetist

## 2023-07-26 ENCOUNTER — Emergency Department (HOSPITAL_COMMUNITY)

## 2023-07-26 ENCOUNTER — Emergency Department (HOSPITAL_COMMUNITY)
Admission: EM | Admit: 2023-07-26 | Discharge: 2023-07-26 | Disposition: A | Discharge status: Home / Self Care | Attending: Emergency Medicine | Admitting: Emergency Medicine

## 2023-07-26 ENCOUNTER — Encounter (HOSPITAL_COMMUNITY)
Admission: EM | Disposition: A | Discharge status: Skilled Nursing Facility | Payer: Self-pay | Source: Home / Self Care | Attending: Emergency Medicine

## 2023-07-26 ENCOUNTER — Encounter (HOSPITAL_COMMUNITY): Payer: Self-pay

## 2023-07-26 ENCOUNTER — Ambulatory Visit: Payer: Self-pay | Admitting: Student

## 2023-07-26 ENCOUNTER — Emergency Department (EMERGENCY_DEPARTMENT_HOSPITAL): Admitting: Certified Registered Nurse Anesthetist

## 2023-07-26 ENCOUNTER — Ambulatory Visit

## 2023-07-26 DIAGNOSIS — R531 Weakness: Secondary | ICD-10-CM | POA: Diagnosis not present

## 2023-07-26 DIAGNOSIS — M1990 Unspecified osteoarthritis, unspecified site: Secondary | ICD-10-CM | POA: Diagnosis not present

## 2023-07-26 DIAGNOSIS — I509 Heart failure, unspecified: Secondary | ICD-10-CM | POA: Insufficient documentation

## 2023-07-26 DIAGNOSIS — S73005A Unspecified dislocation of left hip, initial encounter: Secondary | ICD-10-CM

## 2023-07-26 DIAGNOSIS — Z79899 Other long term (current) drug therapy: Secondary | ICD-10-CM | POA: Diagnosis not present

## 2023-07-26 DIAGNOSIS — N189 Chronic kidney disease, unspecified: Secondary | ICD-10-CM

## 2023-07-26 DIAGNOSIS — I5040 Unspecified combined systolic (congestive) and diastolic (congestive) heart failure: Secondary | ICD-10-CM | POA: Diagnosis not present

## 2023-07-26 DIAGNOSIS — Y92121 Bathroom in nursing home as the place of occurrence of the external cause: Secondary | ICD-10-CM | POA: Diagnosis not present

## 2023-07-26 DIAGNOSIS — Y9389 Activity, other specified: Secondary | ICD-10-CM | POA: Insufficient documentation

## 2023-07-26 DIAGNOSIS — Z7401 Bed confinement status: Secondary | ICD-10-CM | POA: Diagnosis not present

## 2023-07-26 DIAGNOSIS — N182 Chronic kidney disease, stage 2 (mild): Secondary | ICD-10-CM | POA: Diagnosis not present

## 2023-07-26 DIAGNOSIS — M25552 Pain in left hip: Secondary | ICD-10-CM | POA: Diagnosis not present

## 2023-07-26 DIAGNOSIS — J84115 Respiratory bronchiolitis interstitial lung disease: Secondary | ICD-10-CM | POA: Diagnosis not present

## 2023-07-26 DIAGNOSIS — Z4789 Encounter for other orthopedic aftercare: Secondary | ICD-10-CM | POA: Diagnosis not present

## 2023-07-26 DIAGNOSIS — Z515 Encounter for palliative care: Secondary | ICD-10-CM | POA: Insufficient documentation

## 2023-07-26 DIAGNOSIS — Z96642 Presence of left artificial hip joint: Secondary | ICD-10-CM | POA: Diagnosis not present

## 2023-07-26 DIAGNOSIS — Z87891 Personal history of nicotine dependence: Secondary | ICD-10-CM | POA: Insufficient documentation

## 2023-07-26 DIAGNOSIS — I130 Hypertensive heart and chronic kidney disease with heart failure and stage 1 through stage 4 chronic kidney disease, or unspecified chronic kidney disease: Secondary | ICD-10-CM | POA: Insufficient documentation

## 2023-07-26 DIAGNOSIS — J9611 Chronic respiratory failure with hypoxia: Secondary | ICD-10-CM | POA: Insufficient documentation

## 2023-07-26 DIAGNOSIS — W010XXA Fall on same level from slipping, tripping and stumbling without subsequent striking against object, initial encounter: Secondary | ICD-10-CM | POA: Insufficient documentation

## 2023-07-26 DIAGNOSIS — I5032 Chronic diastolic (congestive) heart failure: Secondary | ICD-10-CM | POA: Diagnosis not present

## 2023-07-26 DIAGNOSIS — W1811XA Fall from or off toilet without subsequent striking against object, initial encounter: Secondary | ICD-10-CM | POA: Diagnosis not present

## 2023-07-26 DIAGNOSIS — K219 Gastro-esophageal reflux disease without esophagitis: Secondary | ICD-10-CM | POA: Insufficient documentation

## 2023-07-26 DIAGNOSIS — F32A Depression, unspecified: Secondary | ICD-10-CM | POA: Insufficient documentation

## 2023-07-26 DIAGNOSIS — I10 Essential (primary) hypertension: Secondary | ICD-10-CM | POA: Diagnosis not present

## 2023-07-26 DIAGNOSIS — Z9981 Dependence on supplemental oxygen: Secondary | ICD-10-CM | POA: Diagnosis not present

## 2023-07-26 DIAGNOSIS — N183 Chronic kidney disease, stage 3 unspecified: Secondary | ICD-10-CM | POA: Diagnosis not present

## 2023-07-26 DIAGNOSIS — D649 Anemia, unspecified: Secondary | ICD-10-CM | POA: Insufficient documentation

## 2023-07-26 DIAGNOSIS — I471 Supraventricular tachycardia, unspecified: Secondary | ICD-10-CM | POA: Insufficient documentation

## 2023-07-26 DIAGNOSIS — M25551 Pain in right hip: Secondary | ICD-10-CM | POA: Diagnosis not present

## 2023-07-26 DIAGNOSIS — T84021A Dislocation of internal left hip prosthesis, initial encounter: Secondary | ICD-10-CM | POA: Diagnosis not present

## 2023-07-26 LAB — I-STAT CHEM 8, ED
BUN: 25 mg/dL — ABNORMAL HIGH (ref 8–23)
Calcium, Ion: 1.2 mmol/L (ref 1.15–1.40)
Chloride: 96 mmol/L — ABNORMAL LOW (ref 98–111)
Creatinine, Ser: 1 mg/dL (ref 0.44–1.00)
Glucose, Bld: 91 mg/dL (ref 70–99)
HCT: 31 % — ABNORMAL LOW (ref 36.0–46.0)
Hemoglobin: 10.5 g/dL — ABNORMAL LOW (ref 12.0–15.0)
Potassium: 4.7 mmol/L (ref 3.5–5.1)
Sodium: 131 mmol/L — ABNORMAL LOW (ref 135–145)
TCO2: 27 mmol/L (ref 22–32)

## 2023-07-26 SURGERY — CLOSED REDUCTION, HIP
Anesthesia: General | Site: Hip | Laterality: Left

## 2023-07-26 MED ORDER — LIDOCAINE 2% (20 MG/ML) 5 ML SYRINGE
INTRAMUSCULAR | Status: DC | PRN
  Administered 2023-07-26: 40 mg via INTRAVENOUS

## 2023-07-26 MED ORDER — FENTANYL CITRATE PF 50 MCG/ML IJ SOSY: 12.5000 ug | PREFILLED_SYRINGE | Freq: Once | INTRAMUSCULAR | Status: DC

## 2023-07-26 MED ORDER — PROPOFOL 10 MG/ML IV BOLUS
0.5000 mg/kg | Freq: Once | INTRAVENOUS | Status: AC
  Administered 2023-07-26: 23.8 mg via INTRAVENOUS

## 2023-07-26 MED ORDER — LABETALOL HCL 5 MG/ML IV SOLN
5.0000 mg | INTRAVENOUS | Status: DC | PRN
  Administered 2023-07-26 (×2): 5 mg via INTRAVENOUS

## 2023-07-26 MED ORDER — CHLORHEXIDINE GLUCONATE 0.12 % MT SOLN
15.0000 mL | Freq: Once | OROMUCOSAL | Status: AC
  Administered 2023-07-26: 15 mL via OROMUCOSAL

## 2023-07-26 MED ORDER — FENTANYL CITRATE PF 50 MCG/ML IJ SOSY: 25.0000 ug | PREFILLED_SYRINGE | INTRAMUSCULAR | Status: DC | PRN

## 2023-07-26 MED ORDER — ONDANSETRON HCL 4 MG/2ML IJ SOLN
INTRAMUSCULAR | Status: AC
  Filled 2023-07-26: qty 2

## 2023-07-26 MED ORDER — LACTATED RINGERS IV SOLN: INTRAVENOUS | Status: DC

## 2023-07-26 MED ORDER — CHLORHEXIDINE GLUCONATE 4 % EX SOLN: 60.0000 mL | Freq: Once | CUTANEOUS | Status: DC

## 2023-07-26 MED ORDER — LABETALOL HCL 5 MG/ML IV SOLN
INTRAVENOUS | Status: AC
  Filled 2023-07-26: qty 4

## 2023-07-26 MED ORDER — PROPOFOL 10 MG/ML IV BOLUS
0.5000 mg/kg | Freq: Once | INTRAVENOUS | Status: AC
  Administered 2023-07-26: 23.8 mg via INTRAVENOUS
  Filled 2023-07-26: qty 20

## 2023-07-26 MED ORDER — ACETAMINOPHEN 500 MG PO TABS
1000.0000 mg | ORAL_TABLET | Freq: Once | ORAL | Status: AC
  Administered 2023-07-26: 1000 mg via ORAL
  Filled 2023-07-26: qty 2

## 2023-07-26 MED ORDER — MORPHINE SULFATE (PF) 4 MG/ML IV SOLN
4.0000 mg | Freq: Once | INTRAVENOUS | Status: AC
  Administered 2023-07-26: 4 mg via INTRAVENOUS
  Filled 2023-07-26: qty 1

## 2023-07-26 MED ORDER — PROPOFOL 10 MG/ML IV BOLUS
INTRAVENOUS | Status: DC | PRN
  Administered 2023-07-26: 40 mg via INTRAVENOUS

## 2023-07-26 MED ORDER — POVIDONE-IODINE 10 % EX SWAB: 2.0000 | Freq: Once | CUTANEOUS | Status: DC

## 2023-07-26 MED ORDER — SUCCINYLCHOLINE CHLORIDE 200 MG/10ML IV SOSY
PREFILLED_SYRINGE | INTRAVENOUS | Status: DC | PRN
  Administered 2023-07-26: 20 mg via INTRAVENOUS
  Administered 2023-07-26: 30 mg via INTRAVENOUS

## 2023-07-26 MED ORDER — LIDOCAINE HCL (PF) 2 % IJ SOLN
INTRAMUSCULAR | Status: AC
  Filled 2023-07-26: qty 5

## 2023-07-26 MED ORDER — AMISULPRIDE (ANTIEMETIC) 5 MG/2ML IV SOLN: 10.0000 mg | Freq: Once | INTRAVENOUS | Status: DC | PRN

## 2023-07-26 MED ORDER — PROPOFOL 10 MG/ML IV BOLUS
INTRAVENOUS | Status: AC
  Filled 2023-07-26: qty 20

## 2023-07-26 SURGICAL SUPPLY — 1 items: IMMOBILIZER KNEE 16 UNIV (MISCELLANEOUS) IMPLANT

## 2023-07-26 NOTE — Anesthesia Postprocedure Evaluation (Signed)
 Anesthesia Post Note  Patient: [name]  Procedure(s) Performed: CLOSED REDUCTION, HIP (Left: Hip)     Patient location during evaluation: PACU Anesthesia Type: General Level of consciousness: awake Pain management: pain level controlled Vital Signs Assessment: post-procedure vital signs reviewed and stable Respiratory status: spontaneous breathing, nonlabored ventilation and respiratory function stable Cardiovascular status: blood pressure returned to baseline and stable Postop Assessment: no apparent nausea or vomiting Anesthetic complications: no   No notable events documented.  Last Vitals:  Vitals:   07/26/23 1800 07/26/23 1827  BP: (!) 167/85 (!) 189/107  Pulse: 80 81  Resp: (!) 25 20  Temp:    SpO2: 90% 100%    Last Pain:  Vitals:   07/26/23 1900  PainSc: 8                  Conard Decent

## 2023-07-26 NOTE — Consult Note (Signed)
 ORTHOPAEDIC CONSULTATION  REQUESTING PHYSICIAN: No att. providers found  PCP:  Pcp, No  Chief Complaint: left hip pain  HPI: [name] is a 89 y.o. female with history of hypertension, CHF multiple left hip dislocations. She presents to the ED with left hip pain and deformity. She injured her hip this morning at her nursing home.  Per ED notes it was reported she was getting off the commode just prior to arrival when she felt a pop and slipped. She denies hitting her head or losing consciousness. She states that she feels like her hip is dislocated again. She denies any numbness or weakness. She denies any other pain or injuries from the fall. X-rays show a dislocation of her prosthetic hip.  An attempt was made in the ER to reduce the dislocation under conscious sedation.  The attempt was unsuccessful, so she presents now for closed reduction under general anesthesia.   Patient is unable to provide information about how she dislocated her hip. She is currently in hospice with [facility]. She is residing at [facility].  Past Medical History:  Diagnosis Date   Anemia    CHRONIC   Aortic insufficiency    a. 2D echo 08/29/15: EF 55-60%, mild LVH, diastolic dysfunction, elevated LV filling pressure, mild AI, severe LAE, mild RAE, mild TR, dilated descending thoracic aorta just distal to the takeoff of the left subclavian, measuring 4.1 cm, no pericardial effusion.   Arthritis    OA-SOME BACK AND NECK PAIN,  GOES TO CHIROPRACTOR TWICE A MONTH;  HX OF JOINT REPLACEMENTS    Cancer (HCC)    SKIN CANCERS REMOVED FROM LEGS   CKD (chronic kidney disease), stage II    Depression    Dyspnea    Essential hypertension    Gait abnormality 09/08/2017   GERD (gastroesophageal reflux disease)    Low sodium levels    Myocarditis (HCC)    PSVT (paroxysmal supraventricular tachycardia) (HCC)    Recurrent Pericarditis    RECURRENT    Thoracic aneurysm    a. Followed by [facility] -last seen in  12/2013 w/ plan for f/u MRA and thoracic surgery f/u in 2 yrs, but patient does not wish to follow any longer.   Past Surgical History:  Procedure Laterality Date   APPENDECTOMY     BREAST EXCISIONAL BIOPSY Left 1992   benign   EXCISION/RELEASE BURSA HIP Left 11/24/2012   Procedure: LEFT HIP BURSECTOMY AND TENDON REPAIR ;  Surgeon: Aurther Blue, MD;  Location: [facility];  Service: Orthopedics;  Laterality: Left;   HIP CLOSED REDUCTION Left 12/19/2012   Procedure: CLOSED MANIPULATION HIP;  Surgeon: Bevin Bucks, MD;  Location: [facility];  Service: Orthopedics;  Laterality: Left;   HIP CLOSED REDUCTION Left 12/05/2013   Procedure: CLOSED MANIPULATION HIP;  Surgeon: Lorriane Rote, MD;  Location: [facility];  Service: Orthopedics;  Laterality: Left;   HIP CLOSED REDUCTION Left 09/22/2015   Procedure: CLOSED REDUCTION HIP;  Surgeon: Mort Ards, MD;  Location: [facility];  Service: Orthopedics;  Laterality: Left;   HIP CLOSED REDUCTION Left 12/05/2022   Procedure: CLOSED MANIPULATION HIP;  Surgeon: Liliane Rei, MD;  Location: [facility];  Service: Orthopedics;  Laterality: Left;   HIP CLOSED REDUCTION Left 04/12/2023   Procedure: CLOSED MANIPULATION HIP;  Surgeon: Amada Backer, MD;  Location: [facility];  Service: Orthopedics;  Laterality: Left;   JOINT REPLACEMENT     LEFT TOTAL HIP REPLACEMENT AND REVISIONS X 2  OOPHORECTOMY     has a partial of one ovary remaingin   PELVIC LAPAROSCOPY     ovarian cyst removal,    REVISION TOTAL HIP ARTHROPLASTY     left   right Achilles Tendon repair  5/13   TOTAL KNEE ARTHROPLASTY     right   TOTAL KNEE ARTHROPLASTY     left   VAGINAL HYSTERECTOMY     with ovarian cyst removal   Social History   Socioeconomic History   Marital status: Widowed    Spouse name: Not on file   Number of children: Not on file   Years of education: Not on file   Highest education level: Not on file  Occupational History   Not on file  Tobacco Use   Smoking status: Former     Current packs/day: 0.00    Types: Cigarettes    Start date: 62    Quit date: 79    Years since quitting: 60.4   Smokeless tobacco: Never   Tobacco comments:    quit age 89  Vaping Use   Vaping status: Never Used  Substance and Sexual Activity   Alcohol  use: No    Alcohol /week: 0.0 standard drinks of alcohol    Drug use: Not Currently   Sexual activity: Not Currently    Birth control/protection: Surgical    Comment: HYST-1st intercourse 89 yo-Fewer than 5 partners  Other Topics Concern   Not on file  Social History Narrative   Diet: very good      Caffeine: 1 1/2 cup coffee (usually decaf)      Married, if yes what year: Widow, 1954      Do you live in a house, apartment, assisted living, condo, trailer, ect: [place], independent living      Is it one or more stories: 2nd level. elevate      How many persons live in your home? 1      Pets: No      Highest level or education completed: 14      Current/Past profession: Health and safety inspector      Exercise:  Yes                Type and how often: aerobics twice daily         Living Will: Yes   DNR: Yes   POA/HPOA: Yes      Functional Status:   Do you have difficulty bathing or dressing yourself? No   Do you have difficulty preparing food or eating? No   Do you have difficulty managing your medications? No   Do you have difficulty managing your finances? No   Do you have difficulty affording your medications? No   Social Drivers of Corporate investment banker Strain: Not on file  Food Insecurity: No Food Insecurity (06/28/2023)   Hunger Vital Sign    Worried About Running Out of Food in the Last Year: Never true    Ran Out of Food in the Last Year: Never true  Transportation Needs: No Transportation Needs (06/28/2023)   PRAPARE - Administrator, Civil Service (Medical): No    Lack of Transportation (Non-Medical): No  Physical Activity: Not on file  Stress: Not on file  Social Connections:  Unknown (06/28/2023)   Social Connection and Isolation Panel [NHANES]    Frequency of Communication with Friends and Family: Never    Frequency of Social Gatherings with Friends and Family: Patient declined    Attends Religious  Services: Patient declined    Database administrator or Organizations: Patient declined    Attends Banker Meetings: Patient declined    Marital Status: Patient declined   Family History  Problem Relation Age of Onset   Hypertension Mother    Stroke Mother    Heart attack Father 57   Heart failure Father    Heart failure Sister    Aortic aneurysm Sister    Aortic aneurysm Sister    Stroke Sister    Pneumonia Brother    Stroke Brother    ALS Other    Hyperlipidemia Daughter    Aortic aneurysm Son    Breast cancer Neg Hx    Allergies  Allergen Reactions   Cephalexin Other (See Comments)    Pt states this caused eye problems and STROKE-LIKE SYMPTOMS!!  Not listed on the [facility]   Shellfish-Derived Products Hives and Rash    Not listed on the [facility]   Tape Other (See Comments)    Please use paper tape or Coban Wrap; patient's skin tears VERY easily!!  Not listed on the [facility]   Lincomycin Rash    Not listed on the [facility]   Clindamycin  Hcl Other (See Comments)    "Per pharmacist"   Fosfomycin Other (See Comments)    Disorientation   Not listed on the [facility]   Levofloxacin Other (See Comments)    Reaction not recalled by the patient  Not listed on the [facility]   Other Other (See Comments)    "Steroids" = "Allergic," per [facility]   Penicillamine Other (See Comments)    Reaction not recalled by the patient  Not listed on the [facility]   Prednisone  Other (See Comments)    "Allergic," per [facility]   Sugar-Protein-Starch Other (See Comments)    Sugary foods cause legs to cramp   Ciprofloxacin Rash    Not listed on the [facility]   Sulfa Antibiotics Hives and Rash    Not listed on the [facility]   Sulfites Hives, Swelling and Rash    Not listed on the [facility]   Prior to Admission  medications   Medication Sig Start Date End Date Taking? Authorizing Provider  acetaminophen  (TYLENOL ) 500 MG tablet Take 1,000 mg by mouth 3 (three) times daily as needed (for pain).    [provider]  allopurinol  (ZYLOPRIM ) 300 MG tablet Take 300 mg by mouth daily.    [provider]  amoxicillin  (AMOXIL ) 500 MG capsule Take 2,000 mg by mouth See admin instructions. Take 2,000 mg by mouth one hour prior to dental procedures    [provider]  B Complex Vitamins (VITAMIN B COMPLEX) TABS Take 1 tablet by mouth daily with breakfast.    [provider]  bisacodyl  (DULCOLAX) 5 MG EC tablet Take 10 mg by mouth daily as needed (for constipation).    [provider]  calcium  carbonate (TUMS - DOSED IN MG ELEMENTAL CALCIUM ) 500 MG chewable tablet Chew 2 tablets by mouth every 6 (six) hours as needed for indigestion or heartburn (MAX OF 8 TABLETS IN 24 HOURS).    [provider]  carvedilol  (COREG ) 3.125 MG tablet Take 3.125 mg by mouth 2 (two) times daily with a meal.    [provider]  Cholecalciferol (VITAMIN D3) 1000 units CAPS Take 1,000 Units by mouth in the morning.    [provider]  diltiazem  (CARDIZEM  CD) 180 MG 24 hr capsule Take 180 mg by mouth daily.    [provider]  Emollient (CETAPHIL MOISTURIZING EX) Apply 1 application  topically See admin instructions. Apply to the body 2 times a day    [provider]  FEROSUL 325 (65 Fe) MG tablet Take 325 mg by mouth daily with breakfast.    [provider]  furosemide  (LASIX ) 20 MG tablet Take 1 tablet (20 mg total) by mouth daily as needed for edema or fluid (Weight gain of 3 pounds in 1 day or 5 pounds in 1 week). 06/13/23 06/12/24  [name], [name], DO  losartan  (COZAAR ) 100 MG tablet Take 1 tablet (100 mg total) by mouth daily. 06/13/23 09/11/23  [name], [name], DO  melatonin 3 MG TABS tablet Take 1 tablet (3 mg total) by mouth at bedtime as needed  (insomnia). Patient taking differently: Take 3 mg by mouth at bedtime. 12/10/22   [name], MD  mirtazapine  (REMERON ) 15 MG tablet Take 1 tablet (15 mg total) by mouth at bedtime. 06/13/23 09/11/23  [name], Rema Care, DO  NON FORMULARY Take 3 capsules by mouth See admin instructions. Whole produce "FRUIT" and "VEGGIES" capsules- Take 3 capsules of each formulation by mouth in the morning    [provider]  NON FORMULARY Take 4 oz by mouth See admin instructions. PRUNE JUICE- Drink 4 ounces of prune juice by mouth in the morning    [provider]  OVER THE COUNTER MEDICATION Take 1 Scoop by mouth See admin instructions. AG1 Athletic Greens packets- Mix the contents of one packet into 8 ounces of water and drink by mouth once a day    [provider]  OXYGEN  Inhale 2 L/min into the lungs at bedtime. 2 L/min at bedtime AND as needed for shortness of breath    [provider]  pantoprazole  (PROTONIX ) 40 MG tablet Take 1 tablet (40 mg total) by mouth daily. 06/29/23 07/29/23  [name], [name], MD  Skin Protectants, Misc. (MINERIN CREME) CREA Apply 1 Application topically See admin instructions. Apply to lower extremities twice a day    [provider]  sodium chloride  1 g tablet Take 2 tablets (2 g total) by mouth 2 (two) times daily with a meal. 06/13/23 09/11/23  [name], Rema Care, DO  triamcinolone  cream (KENALOG ) 0.1 % Apply topically 3 (three) times daily. To areas of psoariatic rash. Do not apply to face. 10/25/22   [name], MD   DG Hip Annitta Kindler W or Wo Pelvis 2-3 Views Left Result Date: 07/26/2023 CLINICAL DATA:  Dislocation. Patient reports she sudden heard a crack and fell pain in left hip. History of prior total left hip arthroplasty. EXAM: DG HIP (WITH OR WITHOUT PELVIS) 2-3V LEFT COMPARISON:  Pelvis and left hip radiographs 04/12/2023 demonstrating total left hip arthroplasty dislocation and subsequent reduction, pelvis and left hip  radiographs 12/30/2022 FINDINGS: Redemonstration of total left hip arthroplasty. There is dislocation of the left femoral head prosthesis superiorly from the left acetabular cup. The femoral head prosthesis contacts the lateral aspect of the superior left acetabulum. Otherwise, no perihardware lucency is seen to indicate hardware failure or loosening. Moderate to severe right femoroacetabular joint space narrowing. Mild bilateral sacroiliac subchondral sclerosis. Severe right L3-4 and L4-5 disc space narrowing. IMPRESSION: Superior dislocation of the left femoral head prosthesis from the left acetabular cup. This is similar to initial 04/12/2023 radiographs prior to reduction that day. Electronically Signed   By: Bertina Broccoli M.D.   On: 07/26/2023 16:11    Positive ROS: All other systems  have been reviewed and were otherwise negative with the exception of those mentioned in the HPI and as above.  Physical Exam: General: Alert, no acute distress. She is confused. She is Oriented x2.  Cardiovascular: LE pedal edema bilat. Respiratory: No cyanosis, no use of accessory musculature GI: No organomegaly, abdomen is soft and non-tender Neurologic: Sensation intact distally Psychiatric:  MUSCULOSKELETAL:  Examination of the left lower extremity has skin abrasions throughout extremity including lateral hip. Deformity noted. Pain with palpation over lateral hip. LLE IR and shortened. Palpable pedal pulses in LE bilaterally. Capillary refill <2 seconds. Compartments soft. Sensory and motor function intact in LE bilaterally  Assessment: Left periprosthetic hip dislocation  Plan: I discussed the findings with the patient. She is under hospice care. Plan to go to the OR today for closed reduction under general anesthesia.     Harman Lightning, PA-C    07/26/2023 6:56 PM

## 2023-07-26 NOTE — ED Provider Notes (Signed)
  MSE 89 yo female with probable left hip dislocation. Per EMS report patient was standing up after being on commode had deformity and pain to her left hip.  She has had multiple left hip dislocations in the past Left hip with deformity. Left dp pulses intact No other injuries noted. Orders initiated.   Auston Blush, MD 08/02/23 [phone]

## 2023-07-26 NOTE — ED Provider Notes (Signed)
 Patient was initially seen by Dr. Nora Beal.  Please see her note.  Patient presents with hip dislocation.  Unfortunately attempts in the ED to reduce her hip dislocation were unsuccessful.  Have discussed the case with Dr. Charol Copas.  Plan on going to the OR for reduction.  Patient states she has not had anything to eat since this morning   Trish Furl, MD 07/26/23 1743

## 2023-07-26 NOTE — ED Provider Notes (Signed)
 [place] EMERGENCY DEPARTMENT AT [facility] Provider Note   CSN: [id number] Arrival date & time: 07/26/23  1425     History  Chief Complaint  Patient presents with   Hip Pain    [name] is a 89 y.o. female.  Patient is a 89 year old female with a past medical history of hypertension, CHF and prior left hip replacement with dislocations in the past presenting to the emergency department with left hip pain.  She states that she was getting off the commode just prior to arrival when she felt a pop and slipped.  She denies hitting her head or losing consciousness.  She states that she feels like her hip is dislocated again.  She denies any numbness or weakness.  She denies any other pain or injuries from the fall.  EMS gave her 100 mcg of fentanyl  and route with mild improvement.  The history is provided by the patient.  Hip Pain       Home Medications Prior to Admission medications   Medication Sig Start Date End Date Taking? Authorizing Provider  acetaminophen  (TYLENOL ) 500 MG tablet Take 1,000 mg by mouth 3 (three) times daily as needed (for pain).    [provider]  allopurinol  (ZYLOPRIM ) 300 MG tablet Take 300 mg by mouth daily.    [provider]  amoxicillin  (AMOXIL ) 500 MG capsule Take 2,000 mg by mouth See admin instructions. Take 2,000 mg by mouth one hour prior to dental procedures    [provider]  B Complex Vitamins (VITAMIN B COMPLEX) TABS Take 1 tablet by mouth daily with breakfast.    [provider]  bisacodyl  (DULCOLAX) 5 MG EC tablet Take 10 mg by mouth daily as needed (for constipation).    [provider]  calcium  carbonate (TUMS - DOSED IN MG ELEMENTAL CALCIUM ) 500 MG chewable tablet Chew 2 tablets by mouth every 6 (six) hours as needed for indigestion or heartburn (MAX OF 8 TABLETS IN 24 HOURS).    [provider]  carvedilol  (COREG ) 3.125 MG tablet Take 3.125 mg by mouth 2 (two)  times daily with a meal.    [provider]  Cholecalciferol (VITAMIN D3) 1000 units CAPS Take 1,000 Units by mouth in the morning.    [provider]  diltiazem  (CARDIZEM  CD) 180 MG 24 hr capsule Take 180 mg by mouth daily.    [provider]  Emollient (CETAPHIL MOISTURIZING EX) Apply 1 application  topically See admin instructions. Apply to the body 2 times a day    [provider]  FEROSUL 325 (65 Fe) MG tablet Take 325 mg by mouth daily with breakfast.    [provider]  furosemide  (LASIX ) 20 MG tablet Take 1 tablet (20 mg total) by mouth daily as needed for edema or fluid (Weight gain of 3 pounds in 1 day or 5 pounds in 1 week). 06/13/23 06/12/24  [name], [name], DO  losartan  (COZAAR ) 100 MG tablet Take 1 tablet (100 mg total) by mouth daily. 06/13/23 09/11/23  [name], [name], DO  melatonin 3 MG TABS tablet Take 1 tablet (3 mg total) by mouth at bedtime as needed (insomnia). Patient taking differently: Take 3 mg by mouth at bedtime. 12/10/22   [name], MD  mirtazapine  (REMERON ) 15 MG tablet Take 1 tablet (15 mg total) by mouth at bedtime. 06/13/23 09/11/23  [name], Rema Care, DO  NON FORMULARY Take 3 capsules by mouth See admin instructions. Whole produce "  FRUIT" and "VEGGIES" capsules- Take 3 capsules of each formulation by mouth in the morning    [provider]  NON FORMULARY Take 4 oz by mouth See admin instructions. PRUNE JUICE- Drink 4 ounces of prune juice by mouth in the morning    [provider]  OVER THE COUNTER MEDICATION Take 1 Scoop by mouth See admin instructions. AG1 Athletic Greens packets- Mix the contents of one packet into 8 ounces of water and drink by mouth once a day    [provider]  OXYGEN  Inhale 2 L/min into the lungs at bedtime. 2 L/min at bedtime AND as needed for shortness of breath    [provider]  pantoprazole  (PROTONIX ) 40 MG tablet Take 1 tablet (40 mg total) by mouth daily.  06/29/23 07/29/23  [name], [name], MD  Skin Protectants, Misc. (MINERIN CREME) CREA Apply 1 Application topically See admin instructions. Apply to lower extremities twice a day    [provider]  sodium chloride  1 g tablet Take 2 tablets (2 g total) by mouth 2 (two) times daily with a meal. 06/13/23 09/11/23  [name], Rema Care, DO  triamcinolone  cream (KENALOG ) 0.1 % Apply topically 3 (three) times daily. To areas of psoariatic rash. Do not apply to face. 10/25/22   [name], MD      Allergies    Cephalexin, Shellfish-derived products, Tape, Lincomycin, Clindamycin  hcl, Fosfomycin, Levofloxacin, Other, Penicillamine, Prednisone , Sugar-protein-starch, Ciprofloxacin, Sulfa antibiotics, and Sulfites    Review of Systems   Review of Systems  Physical Exam Updated Vital Signs BP (!) 202/104   Pulse 81   Temp 97.6 F (36.4 C)   Resp 16   Wt 47.6 kg   SpO2 98%   BMI 17.74 kg/m  Physical Exam Vitals and nursing note reviewed.  Constitutional:      General: She is not in acute distress.    Appearance: Normal appearance.  HENT:     Head: Normocephalic and atraumatic.     Nose: Nose normal.     Mouth/Throat:     Mouth: Mucous membranes are moist.     Pharynx: Oropharynx is clear.  Eyes:     Extraocular Movements: Extraocular movements intact.     Conjunctiva/sclera: Conjunctivae normal.  Cardiovascular:     Rate and Rhythm: Normal rate and regular rhythm.     Pulses: Normal pulses.     Heart sounds: Normal heart sounds.  Pulmonary:     Effort: Pulmonary effort is normal.     Breath sounds: Normal breath sounds.  Abdominal:     General: Abdomen is flat.     Palpations: Abdomen is soft.     Tenderness: There is no abdominal tenderness.  Musculoskeletal:        General: Normal range of motion.     Cervical back: Normal range of motion.     Comments: L hip tenderness to palpation, leg shortened and internally rotated   Skin:    General: Skin is warm and dry.   Neurological:     General: No focal deficit present.     Mental Status: She is alert and oriented to person, place, and time.  Psychiatric:        Mood and Affect: Mood normal.        Behavior: Behavior normal.     ED Results / Procedures / Treatments   Labs (all labs ordered are listed, but only abnormal results are displayed) Labs Reviewed  I-STAT CHEM 8, ED -  Abnormal; Notable for the following components:      Result Value   Sodium 131 (*)    Chloride 96 (*)    BUN 25 (*)    Hemoglobin 10.5 (*)    HCT 31.0 (*)    All other components within normal limits    EKG EKG Interpretation Date/Time:  Tuesday Jul 26 2023 14:43:58 EDT Ventricular Rate:  77 PR Interval:  196 QRS Duration:  84 QT Interval:  396 QTC Calculation: 449 R Axis:   84  Text Interpretation: Sinus rhythm Probable left atrial enlargement Left ventricular hypertrophy Anterior infarct, old No significant change since last tracing Confirmed by Celesta Coke ([id number]) on 07/26/2023 2:45:13 PM  Radiology DG Hip Unilat W or Wo Pelvis 2-3 Views Left Result Date: 07/26/2023 CLINICAL DATA:  Dislocation. Patient reports she sudden heard a crack and fell pain in left hip. History of prior total left hip arthroplasty. EXAM: DG HIP (WITH OR WITHOUT PELVIS) 2-3V LEFT COMPARISON:  Pelvis and left hip radiographs 04/12/2023 demonstrating total left hip arthroplasty dislocation and subsequent reduction, pelvis and left hip radiographs 12/30/2022 FINDINGS: Redemonstration of total left hip arthroplasty. There is dislocation of the left femoral head prosthesis superiorly from the left acetabular cup. The femoral head prosthesis contacts the lateral aspect of the superior left acetabulum. Otherwise, no perihardware lucency is seen to indicate hardware failure or loosening. Moderate to severe right femoroacetabular joint space narrowing. Mild bilateral sacroiliac subchondral sclerosis. Severe right L3-4 and L4-5 disc space narrowing.  IMPRESSION: Superior dislocation of the left femoral head prosthesis from the left acetabular cup. This is similar to initial 04/12/2023 radiographs prior to reduction that day. Electronically Signed   By: Bertina Broccoli M.D.   On: 07/26/2023 16:11    Procedures .Reduction of dislocation  Date/Time: 07/26/2023 5:32 PM  Performed by: Kingsley, Jermaine Neuharth K, DO Authorized by: Nolberto Batty, DO  Consent: Verbal consent obtained. Risks and benefits: risks, benefits and alternatives were discussed Consent given by: patient Patient understanding: patient states understanding of the procedure being performed Required items: required blood products, implants, devices, and special equipment available Patient identity confirmed: verbally with patient Time out: Immediately prior to procedure a "time out" was called to verify the correct patient, procedure, equipment, support staff and site/side marked as required. Preparation: Patient was prepped and draped in the usual sterile fashion. Local anesthesia used: no  Anesthesia: Local anesthesia used: no  Sedation: Patient sedated: yes Sedation type: moderate (conscious) sedation Sedatives: propofol  Vitals: Vital signs were monitored during sedation.  Patient tolerance: patient tolerated the procedure well with no immediate complications Comments: Unsuccessful reduction   .Sedation  Date/Time: 07/26/2023 5:32 PM  Performed by: Nolberto Batty, DO Authorized by: Nolberto Batty, DO   Consent:    Consent obtained:  Verbal   Consent given by:  Patient   Risks discussed:  Allergic reaction, prolonged hypoxia resulting in organ damage, prolonged sedation necessitating reversal, dysrhythmia, inadequate sedation and respiratory compromise necessitating ventilatory assistance and intubation   Alternatives discussed:  Analgesia without sedation and anxiolysis Universal protocol:    Immediately prior to procedure, a time out was  called: yes     Patient identity confirmed:  Verbally with patient Indications:    Procedure performed:  Dislocation reduction   Procedure necessitating sedation performed by:  Physician performing sedation Pre-sedation assessment:    Time since last food or drink:  6 hrs   ASA classification: class 3 - patient with severe systemic disease  Mouth opening:  3 or more finger widths   Thyromental distance:  4 finger widths   Mallampati score:  I - soft palate, uvula, fauces, pillars visible   Neck mobility: normal     Pre-sedation assessments completed and reviewed: pre-procedure airway patency not reviewed, pre-procedure cardiovascular function not reviewed, pre-procedure hydration status not reviewed, pre-procedure mental status not reviewed, pre-procedure nausea and vomiting status not reviewed, pre-procedure pain level not reviewed, pre-procedure respiratory function not reviewed and pre-procedure temperature not reviewed   A pre-sedation assessment was completed prior to the start of the procedure Immediate pre-procedure details:    Reassessment: Patient reassessed immediately prior to procedure     Reviewed: vital signs, relevant labs/tests and NPO status     Verified: bag valve mask available, emergency equipment available, intubation equipment available, IV patency confirmed, oxygen  available, reversal medications available and suction available   Procedure details (see MAR for exact dosages):    Preoxygenation:  Nasal cannula   Sedation:  Propofol    Intended level of sedation: deep   Intra-procedure monitoring:  Blood pressure monitoring, cardiac monitor, continuous capnometry, continuous pulse oximetry, frequent LOC assessments and frequent vital sign checks   Intra-procedure events: none     Total Provider sedation time (minutes):  15 Post-procedure details:   A post-sedation assessment was completed following the completion of the procedure.   Attendance: Constant attendance by  certified staff until patient recovered     Recovery: Patient returned to pre-procedure baseline     Post-sedation assessments completed and reviewed: post-procedure airway patency not reviewed, post-procedure cardiovascular function not reviewed, post-procedure hydration status not reviewed, post-procedure mental status not reviewed, post-procedure nausea and vomiting status not reviewed, pain score not reviewed, post-procedure respiratory function not reviewed and post-procedure temperature not reviewed     Patient is stable for discharge or admission: yes     Procedure completion:  Tolerated well, no immediate complications     Medications Ordered in ED Medications  morphine  (PF) 4 MG/ML injection 4 mg (4 mg Intravenous Given 07/26/23 1517)  propofol  (DIPRIVAN ) 10 mg/mL bolus/IV push 23.8 mg (23.8 mg Intravenous Given 07/26/23 1721)  propofol  (DIPRIVAN ) 10 mg/mL bolus/IV push 23.8 mg (23.8 mg Intravenous Given 07/26/23 1710)    ED Course/ Medical Decision Making/ A&P Clinical Course as of 07/26/23 1736  Tue Jul 26, 2023  1525 L hip appears dislocated on XR. Patient has had 1 success ED reduction and has required OR for last two reductions. Will discuss with orthopedics if they would like us  to attempt reduction in ED first or if she would go straight to OR.  [VK]  1602 I spoke with Dr. Charol Copas who recommended ED reduction and to call back if unsuccessful. Reports previous success in OR with flexion of hip and knee to 90 degress with internal rotation and traction. Will plan to call back if unsuccessful. [VK]  1734 Patient's bedside reduction unsuccessful. Did sustain a skin tear to medial L thigh, 6 steristrips were placed and dressing was placed. Patient was signed out to Dr. Monnie Anthony pending ortho eval for likely OR for reduction. [VK]    Clinical Course User Index [VK] Kingsley, Analiya Porco K, DO                                 Medical Decision Making This patient presents to the ED with  chief complaint(s) of L hip pain, fall with pertinent past medical history of  CHF, HTN, prior L hip replacement which further complicates the presenting complaint. The complaint involves an extensive differential diagnosis and also carries with it a high risk of complications and morbidity.    The differential diagnosis includes fracture, dislocation, hardware malfunction, no evidence of neurovascular injury, no other traumatic injuries seen on exam, no presyncopal symptoms a syncopal fall and likely  Additional history obtained: Additional history obtained from EMS  Records reviewed previous admission documents  ED Course and Reassessment: On patient's arrival she is hemodynamically stable in no acute distress and is neurovascularly intact.  Her left leg is shortened and internally rotated.  EKG was performed on arrival that showed normal sinus rhythm without acute ischemic changes, she has left hip x-rays ordered that are pending at this time.  She will be given additional pain control and will be closely reassessed.  Independent labs interpretation:  The following labs were independently interpreted: no significant abnormality  Independent visualization of imaging: - I independently visualized the following imaging with scope of interpretation limited to determining acute life threatening conditions related to emergency care: L hip XR, which revealed L posterior hip reduction  Consultation: - Consulted or discussed management/test interpretation w/ external professional: orthopedics   Risk Prescription drug management.          Final Clinical Impression(s) / ED Diagnoses Final diagnoses:  Dislocation of left hip, initial encounter [facility])    Rx / DC Orders ED Discharge Orders     None         Kingsley, Ezra Marquess K, DO 07/26/23 1736

## 2023-07-26 NOTE — Interval H&P Note (Signed)
 History and Physical Interval Note:  07/26/2023 7:17 PM  [name]  has presented today for surgery, with the diagnosis of dislocated left hip.  The various methods of treatment have been discussed with the patient and family. After consideration of risks, benefits and other options for treatment, the patient has consented to  Procedure(s): CLOSED REDUCTION, HIP (Left) as a surgical intervention.  The patient's history has been reviewed, patient examined, no change in status, stable for surgery.  I have reviewed the patient's chart and labs.  Questions were answered to the patient's satisfaction.     Margart Shears Teela Narducci

## 2023-07-26 NOTE — Op Note (Signed)
 OPERATIVE REPORT   07/26/2023  7:34 PM  PATIENT:  [name]   SURGEON:  Jonnie Nettles, MD  ASSISTANT:  Staff.   PREOPERATIVE DIAGNOSIS: Periprosthetic left hip dislocation.  POSTOPERATIVE DIAGNOSIS:  Same.  PROCEDURE: Closed reduction left hip.  ANESTHESIA:   GETA.  ANTIBIOTICS: None.  IMPLANTS: None.  SPECIMENS: None.  COMPLICATIONS: None.  DISPOSITION: Stable to PACU.  SURGICAL INDICATIONS:  [name] is a 89 y.o. female with a remote history of left total hip arthroplasty.  She has suffered multiple dislocation events, requiring closed reduction.  Earlier today, her hip dislocated while getting on/off the toilet.  She had severe pain and inability to ambulate.  She was brought to the emergency department at [facility], where x-rays revealed posterior left hip periprosthetic dislocation.  EDP attempted closed reduction under conscious sedation without success.  Orthopedic consultation was placed.  She was indicated for closed reduction in the operating room.  The risks, benefits, and alternatives were discussed with the patient preoperatively including but not limited to the risks of failure, cardiopulmonary complications, the need for repeat surgery, among others, and the patient was willing to proceed.  PROCEDURE IN DETAIL: Patient was identified in the preop holding area to identifiers.  Surgical site was marked by myself.  She was taken to the operating room, and general anesthesia was obtained.  Timeout was called, verifying side and site of surgery.  Preop antibiotics were not given as none were indicated.  The hip and knee were flexed to 90 degrees.  Maximum internal rotation was obtained and longitudinal traction was applied, followed by external rotation.  I was able to reduce the hip with a clunk.  Portable AP hip x-ray was obtained confirming concentric reduction.  Patient was then awakened from anesthesia and transferred to the PACU  in stable condition.  There were no known complications.  POSTOPERATIVE PLAN: Weightbearing as tolerated left lower extremity.  Posterior hip precautions.  Follow-up with Dr. Rossie Coon in 2 weeks.

## 2023-07-26 NOTE — Progress Notes (Signed)
 [facility] notified of Patients plan for surgery tonight.  Spoke to [employer].

## 2023-07-26 NOTE — Discharge Instructions (Addendum)
-   Posterior hip precautions.  - Avoid low sitting.  - Follow-up with Dr. France Ina in 2 weeks for recheck of her hip.

## 2023-07-26 NOTE — Progress Notes (Signed)
 Respiratory Therapist at [facility] in room number [phone] during procedure.  Suction with [place] at [facility] set up and ready to use. Ambu bag at [facility] and ready to use.  Patient placed on ETCO2 Nasal Cannula at 2 LPM.  Vitals at conclusion of procedure:  ETCO2 24 mmHg HR 79 RR 16 SPO2 98%  Patient awake and able to verbalize name.

## 2023-07-26 NOTE — ED Triage Notes (Signed)
 EMS reports from [place], Pt states she stood up and heard crack and felt pain in left hip. Hx of prior hip replacement. EMS notes shortening and rotation.  BP 174/90 HR 74 RR 18 Sp02 95 RA  18 LAC Fentanyl  enroute.

## 2023-07-26 NOTE — Progress Notes (Signed)
 [facility]an Smalling ED [facility] liaison note     This patient is a current hospice patient with [facility]. Please see media tab for detailed hospice report.    Liaison will continue to follow for any discharge planning needs and to coordinate continuation of hospice care.    Please don't hesitate to call with any Hospice related questions or concerns.    Thank you for the opportunity to participate in this patient's care.  Madelene Schanz, BSN, RN, OCN [employer] [phone]

## 2023-07-26 NOTE — Anesthesia Preprocedure Evaluation (Addendum)
 Anesthesia Evaluation  Patient identified by MRN, date of birth, ID band Patient awake    Reviewed: Allergy & Precautions, NPO status , Patient's Chart, lab work & pertinent test results  History of Anesthesia Complications Negative for: history of anesthetic complications  Airway Mallampati: II  TM Distance: >3 FB Neck ROM: Full   Comment: Previous grade I view with MAC 3, easy mask Dental  (+) Dental Advisory Given   Pulmonary neg shortness of breath, neg COPD, neg recent URI, former smoker Chronic hypoxic respiratory failure on 2 LPM [place] at home, currently on 4 LPM, ILD   Pulmonary exam normal breath sounds clear to auscultation       Cardiovascular hypertension (carvedilol , losartan , furosemide ), Pt. on medications (-) angina +CHF  (-) Past MI, (-) Cardiac Stents and (-) CABG + dysrhythmias (PSVT) + Valvular Problems/Murmurs (mild-to-moderate) AI  Rhythm:Regular Rate:Normal  H/o recurrent pericarditis and myocarditis, thoracic aneurysm  TTE 04/09/2022: IMPRESSIONS    1. Left ventricular ejection fraction, by estimation, is 60 to 65%. The  left ventricle has normal function. The left ventricle has no regional  wall motion abnormalities. Left ventricular diastolic parameters are  consistent with Grade II diastolic  dysfunction (pseudonormalization). Elevated left ventricular end-diastolic  pressure.   2. Right ventricular systolic function is normal. The right ventricular  size is normal. There is normal pulmonary artery systolic pressure.   3. Left atrial size was severely dilated.   4. The mitral valve is abnormal. Trivial mitral valve regurgitation. No  evidence of mitral stenosis.   5. The aortic valve is tricuspid. There is mild calcification of the  aortic valve. There is mild thickening of the aortic valve. Aortic valve  regurgitation is mild to moderate. Aortic valve sclerosis is present, with  no evidence of aortic  valve  stenosis.   6. Aortic dilatation noted. There is mild dilatation of the aortic root,  measuring 38 mm. There is moderate dilatation of the ascending aorta,  measuring 41 mm.   7. The inferior vena cava is normal in size with greater than 50%  respiratory variability, suggesting right atrial pressure of 3 mmHg.     Neuro/Psych neg Seizures PSYCHIATRIC DISORDERS  Depression     Neuromuscular disease (cervical spondylosis, lumbar radiculopathy)    GI/Hepatic Neg liver ROS,GERD  Medicated,,  Endo/Other  negative endocrine ROS    Renal/GU CRFRenal disease     Musculoskeletal  (+) Arthritis ,    Abdominal   Peds  Hematology  (+) Blood dyscrasia, anemia , REFUSES BLOOD PRODUCTSLab Results      Component                Value               Date                      WBC                      6.9                 06/29/2023                HGB                      10.5 (L)            07/26/2023  HCT                      31.0 (L)            07/26/2023                MCV                      84.1                06/29/2023                PLT                      358                 06/29/2023              Anesthesia Other Findings   Reproductive/Obstetrics                              Anesthesia Physical Anesthesia Plan  ASA: 3  Anesthesia Plan: General   Post-op Pain Management:    Induction: Intravenous  PONV Risk Score and Plan: 3 and Ondansetron  and Treatment may vary due to age or medical condition  Airway Management Planned: Mask  Additional Equipment:   Intra-op Plan:   Post-operative Plan:   Informed Consent: I have reviewed the patients History and Physical, chart, labs and discussed the procedure including the risks, benefits and alternatives for the proposed anesthesia with the patient or authorized representative who has indicated his/her understanding and acceptance.   Patient has DNR.  Discussed DNR with  patient, Discussed DNR with power of attorney and Continue DNR.   Dental advisory given  Plan Discussed with: CRNA and Anesthesiologist  Anesthesia Plan Comments: (Patient received propofol  in ED for attempted reduction of hip. Therefore, phone consent obtained from the patient's son, Dustin Gimenez at [phone]. He confirmed patient's wish for continued DNR and no blood transfusions. She also stated her wish for continued DNR and no blood transfusions. Medications for BP and HR are okay.  Risks of general anesthesia discussed including, but not limited to, sore throat, hoarse voice, chipped/damaged teeth, injury to vocal cords, nausea and vomiting, allergic reactions, lung infection, heart attack, stroke, and death. All questions answered. )         Anesthesia Quick Evaluation

## 2023-07-26 NOTE — Transfer of Care (Signed)
 Immediate Anesthesia Transfer of Care Note  Patient: [name]  Procedure(s) Performed: CLOSED REDUCTION, HIP (Left: Hip)  Patient Location: PACU  Anesthesia Type:General  Level of Consciousness: awake and patient cooperative  Airway & Oxygen  Therapy: Patient Spontanous Breathing and Patient connected to nasal cannula oxygen   Post-op Assessment: Report given to RN and Post -op Vital signs reviewed and stable  Post vital signs: Reviewed and stable  Last Vitals:  Vitals Value Taken Time  BP 221/101 07/26/23 1949  Temp    Pulse 91 07/26/23 1951  Resp    SpO2 100 % 07/26/23 1951  Vitals shown include unfiled device data.  Last Pain:  Vitals:   07/26/23 1900  PainSc: 8          Complications: No notable events documented.

## 2023-07-26 NOTE — H&P (View-Only) (Signed)
 ORTHOPAEDIC CONSULTATION  REQUESTING PHYSICIAN: No att. providers found  PCP:  Pcp, No  Chief Complaint: left hip pain  HPI: [name] is a 89 y.o. female with history of hypertension, CHF multiple left hip dislocations. She presents to the ED with left hip pain and deformity. She injured her hip this morning at her nursing home.  Per ED notes it was reported she was getting off the commode just prior to arrival when she felt a pop and slipped. She denies hitting her head or losing consciousness. She states that she feels like her hip is dislocated again. She denies any numbness or weakness. She denies any other pain or injuries from the fall. X-rays show a dislocation of her prosthetic hip.  An attempt was made in the ER to reduce the dislocation under conscious sedation.  The attempt was unsuccessful, so she presents now for closed reduction under general anesthesia.   Patient is unable to provide information about how she dislocated her hip. She is currently in hospice with [facility]. She is residing at [facility].  Past Medical History:  Diagnosis Date   Anemia    CHRONIC   Aortic insufficiency    a. 2D echo 08/29/15: EF 55-60%, mild LVH, diastolic dysfunction, elevated LV filling pressure, mild AI, severe LAE, mild RAE, mild TR, dilated descending thoracic aorta just distal to the takeoff of the left subclavian, measuring 4.1 cm, no pericardial effusion.   Arthritis    OA-SOME BACK AND NECK PAIN,  GOES TO CHIROPRACTOR TWICE A MONTH;  HX OF JOINT REPLACEMENTS    Cancer (HCC)    SKIN CANCERS REMOVED FROM LEGS   CKD (chronic kidney disease), stage II    Depression    Dyspnea    Essential hypertension    Gait abnormality 09/08/2017   GERD (gastroesophageal reflux disease)    Low sodium levels    Myocarditis (HCC)    PSVT (paroxysmal supraventricular tachycardia) (HCC)    Recurrent Pericarditis    RECURRENT    Thoracic aneurysm    a. Followed by [facility] -last seen in  12/2013 w/ plan for f/u MRA and thoracic surgery f/u in 2 yrs, but patient does not wish to follow any longer.   Past Surgical History:  Procedure Laterality Date   APPENDECTOMY     BREAST EXCISIONAL BIOPSY Left 1992   benign   EXCISION/RELEASE BURSA HIP Left 11/24/2012   Procedure: LEFT HIP BURSECTOMY AND TENDON REPAIR ;  Surgeon: Aurther Blue, MD;  Location: [facility];  Service: Orthopedics;  Laterality: Left;   HIP CLOSED REDUCTION Left 12/19/2012   Procedure: CLOSED MANIPULATION HIP;  Surgeon: Bevin Bucks, MD;  Location: [facility];  Service: Orthopedics;  Laterality: Left;   HIP CLOSED REDUCTION Left 12/05/2013   Procedure: CLOSED MANIPULATION HIP;  Surgeon: Lorriane Rote, MD;  Location: [facility];  Service: Orthopedics;  Laterality: Left;   HIP CLOSED REDUCTION Left 09/22/2015   Procedure: CLOSED REDUCTION HIP;  Surgeon: Mort Ards, MD;  Location: [facility];  Service: Orthopedics;  Laterality: Left;   HIP CLOSED REDUCTION Left 12/05/2022   Procedure: CLOSED MANIPULATION HIP;  Surgeon: Liliane Rei, MD;  Location: [facility];  Service: Orthopedics;  Laterality: Left;   HIP CLOSED REDUCTION Left 04/12/2023   Procedure: CLOSED MANIPULATION HIP;  Surgeon: Amada Backer, MD;  Location: [facility];  Service: Orthopedics;  Laterality: Left;   JOINT REPLACEMENT     LEFT TOTAL HIP REPLACEMENT AND REVISIONS X 2  OOPHORECTOMY     has a partial of one ovary remaingin   PELVIC LAPAROSCOPY     ovarian cyst removal,    REVISION TOTAL HIP ARTHROPLASTY     left   right Achilles Tendon repair  5/13   TOTAL KNEE ARTHROPLASTY     right   TOTAL KNEE ARTHROPLASTY     left   VAGINAL HYSTERECTOMY     with ovarian cyst removal   Social History   Socioeconomic History   Marital status: Widowed    Spouse name: Not on file   Number of children: Not on file   Years of education: Not on file   Highest education level: Not on file  Occupational History   Not on file  Tobacco Use   Smoking status: Former     Current packs/day: 0.00    Types: Cigarettes    Start date: 62    Quit date: 79    Years since quitting: 60.4   Smokeless tobacco: Never   Tobacco comments:    quit age 89  Vaping Use   Vaping status: Never Used  Substance and Sexual Activity   Alcohol  use: No    Alcohol /week: 0.0 standard drinks of alcohol    Drug use: Not Currently   Sexual activity: Not Currently    Birth control/protection: Surgical    Comment: HYST-1st intercourse 89 yo-Fewer than 5 partners  Other Topics Concern   Not on file  Social History Narrative   Diet: very good      Caffeine: 1 1/2 cup coffee (usually decaf)      Married, if yes what year: Widow, 1954      Do you live in a house, apartment, assisted living, condo, trailer, ect: [place], independent living      Is it one or more stories: 2nd level. elevate      How many persons live in your home? 1      Pets: No      Highest level or education completed: 14      Current/Past profession: Health and safety inspector      Exercise:  Yes                Type and how often: aerobics twice daily         Living Will: Yes   DNR: Yes   POA/HPOA: Yes      Functional Status:   Do you have difficulty bathing or dressing yourself? No   Do you have difficulty preparing food or eating? No   Do you have difficulty managing your medications? No   Do you have difficulty managing your finances? No   Do you have difficulty affording your medications? No   Social Drivers of Corporate investment banker Strain: Not on file  Food Insecurity: No Food Insecurity (06/28/2023)   Hunger Vital Sign    Worried About Running Out of Food in the Last Year: Never true    Ran Out of Food in the Last Year: Never true  Transportation Needs: No Transportation Needs (06/28/2023)   PRAPARE - Administrator, Civil Service (Medical): No    Lack of Transportation (Non-Medical): No  Physical Activity: Not on file  Stress: Not on file  Social Connections:  Unknown (06/28/2023)   Social Connection and Isolation Panel [NHANES]    Frequency of Communication with Friends and Family: Never    Frequency of Social Gatherings with Friends and Family: Patient declined    Attends Religious  Services: Patient declined    Database administrator or Organizations: Patient declined    Attends Banker Meetings: Patient declined    Marital Status: Patient declined   Family History  Problem Relation Age of Onset   Hypertension Mother    Stroke Mother    Heart attack Father 57   Heart failure Father    Heart failure Sister    Aortic aneurysm Sister    Aortic aneurysm Sister    Stroke Sister    Pneumonia Brother    Stroke Brother    ALS Other    Hyperlipidemia Daughter    Aortic aneurysm Son    Breast cancer Neg Hx    Allergies  Allergen Reactions   Cephalexin Other (See Comments)    Pt states this caused eye problems and STROKE-LIKE SYMPTOMS!!  Not listed on the [facility]   Shellfish-Derived Products Hives and Rash    Not listed on the [facility]   Tape Other (See Comments)    Please use paper tape or Coban Wrap; patient's skin tears VERY easily!!  Not listed on the [facility]   Lincomycin Rash    Not listed on the [facility]   Clindamycin  Hcl Other (See Comments)    "Per pharmacist"   Fosfomycin Other (See Comments)    Disorientation   Not listed on the [facility]   Levofloxacin Other (See Comments)    Reaction not recalled by the patient  Not listed on the [facility]   Other Other (See Comments)    "Steroids" = "Allergic," per [facility]   Penicillamine Other (See Comments)    Reaction not recalled by the patient  Not listed on the [facility]   Prednisone  Other (See Comments)    "Allergic," per [facility]   Sugar-Protein-Starch Other (See Comments)    Sugary foods cause legs to cramp   Ciprofloxacin Rash    Not listed on the [facility]   Sulfa Antibiotics Hives and Rash    Not listed on the [facility]   Sulfites Hives, Swelling and Rash    Not listed on the [facility]   Prior to Admission  medications   Medication Sig Start Date End Date Taking? Authorizing Provider  acetaminophen  (TYLENOL ) 500 MG tablet Take 1,000 mg by mouth 3 (three) times daily as needed (for pain).    [provider]  allopurinol  (ZYLOPRIM ) 300 MG tablet Take 300 mg by mouth daily.    [provider]  amoxicillin  (AMOXIL ) 500 MG capsule Take 2,000 mg by mouth See admin instructions. Take 2,000 mg by mouth one hour prior to dental procedures    [provider]  B Complex Vitamins (VITAMIN B COMPLEX) TABS Take 1 tablet by mouth daily with breakfast.    [provider]  bisacodyl  (DULCOLAX) 5 MG EC tablet Take 10 mg by mouth daily as needed (for constipation).    [provider]  calcium  carbonate (TUMS - DOSED IN MG ELEMENTAL CALCIUM ) 500 MG chewable tablet Chew 2 tablets by mouth every 6 (six) hours as needed for indigestion or heartburn (MAX OF 8 TABLETS IN 24 HOURS).    [provider]  carvedilol  (COREG ) 3.125 MG tablet Take 3.125 mg by mouth 2 (two) times daily with a meal.    [provider]  Cholecalciferol (VITAMIN D3) 1000 units CAPS Take 1,000 Units by mouth in the morning.    [provider]  diltiazem  (CARDIZEM  CD) 180 MG 24 hr capsule Take 180 mg by mouth daily.    [provider]  Emollient (CETAPHIL MOISTURIZING EX) Apply 1 application  topically See admin instructions. Apply to the body 2 times a day    [provider]  FEROSUL 325 (65 Fe) MG tablet Take 325 mg by mouth daily with breakfast.    [provider]  furosemide  (LASIX ) 20 MG tablet Take 1 tablet (20 mg total) by mouth daily as needed for edema or fluid (Weight gain of 3 pounds in 1 day or 5 pounds in 1 week). 06/13/23 06/12/24  [name], [name], DO  losartan  (COZAAR ) 100 MG tablet Take 1 tablet (100 mg total) by mouth daily. 06/13/23 09/11/23  [name], [name], DO  melatonin 3 MG TABS tablet Take 1 tablet (3 mg total) by mouth at bedtime as needed  (insomnia). Patient taking differently: Take 3 mg by mouth at bedtime. 12/10/22   [name], MD  mirtazapine  (REMERON ) 15 MG tablet Take 1 tablet (15 mg total) by mouth at bedtime. 06/13/23 09/11/23  [name], Rema Care, DO  NON FORMULARY Take 3 capsules by mouth See admin instructions. Whole produce "FRUIT" and "VEGGIES" capsules- Take 3 capsules of each formulation by mouth in the morning    [provider]  NON FORMULARY Take 4 oz by mouth See admin instructions. PRUNE JUICE- Drink 4 ounces of prune juice by mouth in the morning    [provider]  OVER THE COUNTER MEDICATION Take 1 Scoop by mouth See admin instructions. AG1 Athletic Greens packets- Mix the contents of one packet into 8 ounces of water and drink by mouth once a day    [provider]  OXYGEN  Inhale 2 L/min into the lungs at bedtime. 2 L/min at bedtime AND as needed for shortness of breath    [provider]  pantoprazole  (PROTONIX ) 40 MG tablet Take 1 tablet (40 mg total) by mouth daily. 06/29/23 07/29/23  [name], [name], MD  Skin Protectants, Misc. (MINERIN CREME) CREA Apply 1 Application topically See admin instructions. Apply to lower extremities twice a day    [provider]  sodium chloride  1 g tablet Take 2 tablets (2 g total) by mouth 2 (two) times daily with a meal. 06/13/23 09/11/23  [name], Rema Care, DO  triamcinolone  cream (KENALOG ) 0.1 % Apply topically 3 (three) times daily. To areas of psoariatic rash. Do not apply to face. 10/25/22   [name], MD   DG Hip Annitta Kindler W or Wo Pelvis 2-3 Views Left Result Date: 07/26/2023 CLINICAL DATA:  Dislocation. Patient reports she sudden heard a crack and fell pain in left hip. History of prior total left hip arthroplasty. EXAM: DG HIP (WITH OR WITHOUT PELVIS) 2-3V LEFT COMPARISON:  Pelvis and left hip radiographs 04/12/2023 demonstrating total left hip arthroplasty dislocation and subsequent reduction, pelvis and left hip  radiographs 12/30/2022 FINDINGS: Redemonstration of total left hip arthroplasty. There is dislocation of the left femoral head prosthesis superiorly from the left acetabular cup. The femoral head prosthesis contacts the lateral aspect of the superior left acetabulum. Otherwise, no perihardware lucency is seen to indicate hardware failure or loosening. Moderate to severe right femoroacetabular joint space narrowing. Mild bilateral sacroiliac subchondral sclerosis. Severe right L3-4 and L4-5 disc space narrowing. IMPRESSION: Superior dislocation of the left femoral head prosthesis from the left acetabular cup. This is similar to initial 04/12/2023 radiographs prior to reduction that day. Electronically Signed   By: Bertina Broccoli M.D.   On: 07/26/2023 16:11    Positive ROS: All other systems  have been reviewed and were otherwise negative with the exception of those mentioned in the HPI and as above.  Physical Exam: General: Alert, no acute distress. She is confused. She is Oriented x2.  Cardiovascular: LE pedal edema bilat. Respiratory: No cyanosis, no use of accessory musculature GI: No organomegaly, abdomen is soft and non-tender Neurologic: Sensation intact distally Psychiatric:  MUSCULOSKELETAL:  Examination of the left lower extremity has skin abrasions throughout extremity including lateral hip. Deformity noted. Pain with palpation over lateral hip. LLE IR and shortened. Palpable pedal pulses in LE bilaterally. Capillary refill <2 seconds. Compartments soft. Sensory and motor function intact in LE bilaterally  Assessment: Left periprosthetic hip dislocation  Plan: I discussed the findings with the patient. She is under hospice care. Plan to go to the OR today for closed reduction under general anesthesia.     Harman Lightning, PA-C    07/26/2023 6:56 PM

## 2023-07-27 ENCOUNTER — Encounter (HOSPITAL_COMMUNITY): Payer: Self-pay | Admitting: Orthopedic Surgery

## 2023-07-28 ENCOUNTER — Emergency Department (HOSPITAL_COMMUNITY)
Admission: EM | Admit: 2023-07-28 | Discharge: 2023-07-29 | Disposition: A | Discharge status: Home / Self Care | Attending: Emergency Medicine | Admitting: Emergency Medicine

## 2023-07-28 ENCOUNTER — Ambulatory Visit: Admitting: Podiatry

## 2023-07-28 ENCOUNTER — Emergency Department (HOSPITAL_COMMUNITY)

## 2023-07-28 DIAGNOSIS — M25532 Pain in left wrist: Secondary | ICD-10-CM | POA: Diagnosis not present

## 2023-07-28 DIAGNOSIS — Z96642 Presence of left artificial hip joint: Secondary | ICD-10-CM | POA: Diagnosis not present

## 2023-07-28 DIAGNOSIS — J84115 Respiratory bronchiolitis interstitial lung disease: Secondary | ICD-10-CM | POA: Diagnosis not present

## 2023-07-28 DIAGNOSIS — S7002XA Contusion of left hip, initial encounter: Secondary | ICD-10-CM | POA: Diagnosis not present

## 2023-07-28 DIAGNOSIS — N182 Chronic kidney disease, stage 2 (mild): Secondary | ICD-10-CM | POA: Diagnosis not present

## 2023-07-28 DIAGNOSIS — S60212A Contusion of left wrist, initial encounter: Secondary | ICD-10-CM | POA: Insufficient documentation

## 2023-07-28 DIAGNOSIS — M19032 Primary osteoarthritis, left wrist: Secondary | ICD-10-CM | POA: Diagnosis not present

## 2023-07-28 DIAGNOSIS — S71111A Laceration without foreign body, right thigh, initial encounter: Secondary | ICD-10-CM | POA: Diagnosis not present

## 2023-07-28 DIAGNOSIS — J9611 Chronic respiratory failure with hypoxia: Secondary | ICD-10-CM | POA: Diagnosis not present

## 2023-07-28 DIAGNOSIS — W1839XA Other fall on same level, initial encounter: Secondary | ICD-10-CM | POA: Diagnosis not present

## 2023-07-28 DIAGNOSIS — W010XXA Fall on same level from slipping, tripping and stumbling without subsequent striking against object, initial encounter: Secondary | ICD-10-CM

## 2023-07-28 DIAGNOSIS — M25551 Pain in right hip: Secondary | ICD-10-CM | POA: Diagnosis not present

## 2023-07-28 DIAGNOSIS — S71011A Laceration without foreign body, right hip, initial encounter: Secondary | ICD-10-CM | POA: Insufficient documentation

## 2023-07-28 DIAGNOSIS — T148XXA Other injury of unspecified body region, initial encounter: Secondary | ICD-10-CM

## 2023-07-28 DIAGNOSIS — I5040 Unspecified combined systolic (congestive) and diastolic (congestive) heart failure: Secondary | ICD-10-CM | POA: Diagnosis not present

## 2023-07-28 DIAGNOSIS — S7001XA Contusion of right hip, initial encounter: Secondary | ICD-10-CM | POA: Insufficient documentation

## 2023-07-28 DIAGNOSIS — S6992XA Unspecified injury of left wrist, hand and finger(s), initial encounter: Secondary | ICD-10-CM | POA: Diagnosis present

## 2023-07-28 DIAGNOSIS — Y92002 Bathroom of unspecified non-institutional (private) residence single-family (private) house as the place of occurrence of the external cause: Secondary | ICD-10-CM | POA: Diagnosis not present

## 2023-07-28 DIAGNOSIS — W19XXXA Unspecified fall, initial encounter: Secondary | ICD-10-CM | POA: Diagnosis not present

## 2023-07-28 DIAGNOSIS — M47816 Spondylosis without myelopathy or radiculopathy, lumbar region: Secondary | ICD-10-CM | POA: Diagnosis not present

## 2023-07-28 DIAGNOSIS — M1812 Unilateral primary osteoarthritis of first carpometacarpal joint, left hand: Secondary | ICD-10-CM | POA: Diagnosis not present

## 2023-07-28 DIAGNOSIS — D649 Anemia, unspecified: Secondary | ICD-10-CM | POA: Diagnosis not present

## 2023-07-28 DIAGNOSIS — I10 Essential (primary) hypertension: Secondary | ICD-10-CM | POA: Diagnosis not present

## 2023-07-28 MED ORDER — ACETAMINOPHEN 325 MG PO TABS
650.0000 mg | ORAL_TABLET | Freq: Once | ORAL | Status: AC
  Administered 2023-07-28: 650 mg via ORAL
  Filled 2023-07-28: qty 2

## 2023-07-28 NOTE — ED Provider Notes (Signed)
 [facility] AT [facility] Provider Note   CSN: [id number] Arrival date & time: 07/28/23  2012     History  Chief Complaint  Patient presents with   [name] [name] is a 89 y.o. female.  Pt presents via EMS s/p fall. No loc. Pt c/o pain to right hip and skin tear to right hip/thigh area. Also contusion to left wrist.  Pt denies headache. No neck or back pain. No chest pain or sob. No abd pain or nv. Notes prior skin tear to left leg. Denies other extremity pain or injury.   The history is provided by the patient, medical records and the EMS personnel. The history is limited by the condition of the patient.  Fall Pertinent negatives include no chest pain, no abdominal pain, no headaches and no shortness of breath.       Home Medications Prior to Admission medications   Medication Sig Start Date End Date Taking? Authorizing Provider  acetaminophen  (TYLENOL ) 500 MG tablet Take 1,000 mg by mouth 3 (three) times daily as needed for moderate pain (pain score 4-6) (for pain).    [provider]  allopurinol  (ZYLOPRIM ) 300 MG tablet Take 300 mg by mouth daily.    [provider]  ALPRAZolam (XANAX) 0.25 MG tablet Take 0.25 mg by mouth 2 (two) times daily as needed for anxiety.    [provider]  amoxicillin  (AMOXIL ) 500 MG capsule Take 2,000 mg by mouth See admin instructions. Take 2,000 mg by mouth one hour prior to dental procedures    [provider]  B Complex Vitamins (VITAMIN B COMPLEX) TABS Take 1 tablet by mouth daily with breakfast.    [provider]  bisacodyl  (DULCOLAX) 5 MG EC tablet Take 10 mg by mouth daily as needed (for constipation).    [provider]  calcium  carbonate (TUMS - DOSED IN MG ELEMENTAL CALCIUM ) 500 MG chewable tablet Chew 2 tablets by mouth every 6 (six) hours as needed for indigestion or heartburn (MAX OF 8 TABLETS IN 24 HOURS).    [provider]   carvedilol  (COREG ) 3.125 MG tablet Take 3.125 mg by mouth 2 (two) times daily with a meal.    [provider]  Cholecalciferol (VITAMIN D3) 1000 units CAPS Take 1,000 Units by mouth in the morning.    [provider]  diltiazem  (CARDIZEM  CD) 180 MG 24 hr capsule Take 180 mg by mouth daily.    [provider]  Emollient (CETAPHIL MOISTURIZING EX) Apply 1 application  topically See admin instructions. Apply to the body 2 times a day    [provider]  FEROSUL 325 (65 Fe) MG tablet Take 325 mg by mouth daily with breakfast.    [provider]  furosemide  (LASIX ) 20 MG tablet Take 1 tablet (20 mg total) by mouth daily as needed for edema or fluid (Weight gain of 3 pounds in 1 day or 5 pounds in 1 week). 06/13/23 06/12/24  [name], [name], DO  hydrOXYzine  (ATARAX ) 25 MG tablet Take 25 mg by mouth every 6 (six) hours as needed for anxiety.    [provider]  losartan  (COZAAR ) 100 MG tablet Take 1 tablet (100 mg total) by mouth daily. 06/13/23 09/11/23  [name], [name], DO  melatonin 3 MG TABS tablet Take 1 tablet (3 mg total) by mouth at bedtime as needed (insomnia). Patient taking differently: Take 3 mg by mouth at bedtime. 12/10/22  [name], MD  mirtazapine  (REMERON ) 15 MG tablet Take 1 tablet (15 mg total) by mouth at bedtime. 06/13/23 09/11/23  [name], Rema Care, DO  NON FORMULARY Take 3 capsules by mouth See admin instructions. Whole produce "FRUIT" and "VEGGIES" capsules- Take 3 capsules of each formulation by mouth in the morning    [provider]  NON FORMULARY Take 4 oz by mouth See admin instructions. PRUNE JUICE- Drink 4 ounces of prune juice by mouth in the morning    [provider]  OVER THE COUNTER MEDICATION Take 1 Scoop by mouth See admin instructions. AG1 Athletic Greens packets- Mix the contents of one packet into 8 ounces of water and drink by mouth once a day    [provider]  OXYGEN  Inhale 2 L/min  into the lungs at bedtime. 2 L/min at bedtime AND as needed for shortness of breath    [provider]  pantoprazole  (PROTONIX ) 40 MG tablet Take 1 tablet (40 mg total) by mouth daily. 06/29/23 07/29/23  [name], [name], MD  Skin Protectants, Misc. (MINERIN CREME) CREA Apply 1 Application topically See admin instructions. Apply to lower extremities twice a day    [provider]  sodium chloride  1 g tablet Take 2 tablets (2 g total) by mouth 2 (two) times daily with a meal. 06/13/23 09/11/23  [name], Rema Care, DO  triamcinolone  cream (KENALOG ) 0.1 % Apply topically 3 (three) times daily. To areas of psoariatic rash. Do not apply to face. Patient taking differently: Apply 1 Application topically 3 (three) times daily. To areas of psoariatic rash. Do not apply to face. 10/25/22   [name], MD      Allergies    Cephalexin, Shellfish-derived products, Tape, Lincomycin, Clindamycin  hcl, Fosfomycin, Levofloxacin, Other, Penicillamine, Prednisone , Sugar-protein-starch, Ciprofloxacin, Sulfa antibiotics, and Sulfites    Review of Systems   Review of Systems  Constitutional:  Negative for fever.  Respiratory:  Negative for shortness of breath.   Cardiovascular:  Negative for chest pain.  Gastrointestinal:  Negative for abdominal pain and vomiting.  Genitourinary:  Negative for flank pain.  Musculoskeletal:  Negative for back pain and neck pain.  Skin:  Positive for wound.  Neurological:  Negative for weakness, numbness and headaches.    Physical Exam Updated Vital Signs BP (!) 175/88 (BP Location: Right Arm)   Pulse 88   Temp 98.7 F (37.1 C) (Oral)   Resp 18   SpO2 100% Comment: 2L via nasal cannula Physical Exam Vitals and nursing note reviewed.  Constitutional:      Appearance: Normal appearance. She is well-developed.  HENT:     Head: Atraumatic.     Nose: Nose normal.     Mouth/Throat:     Mouth: Mucous membranes are moist.  Eyes:     General: No  scleral icterus.    Conjunctiva/sclera: Conjunctivae normal.     Pupils: Pupils are equal, round, and reactive to light.  Neck:     Trachea: No tracheal deviation.  Cardiovascular:     Rate and Rhythm: Normal rate and regular rhythm.     Pulses: Normal pulses.     Heart sounds: Normal heart sounds. No murmur heard.    No friction rub. No gallop.  Pulmonary:     Effort: Pulmonary effort is normal. No respiratory distress.     Breath sounds: Normal breath sounds.  Chest:     Chest wall: No tenderness.  Abdominal:     General: There is  no distension.     Palpations: Abdomen is soft.     Tenderness: There is no abdominal tenderness.  Genitourinary:    Comments:   Musculoskeletal:        General: No swelling.     Cervical back: Normal range of motion and neck supple. No rigidity or tenderness. No muscular tenderness.     Comments: CTLS spine, non tender, aligned, no step off. No gross rotation or shortening of legs. Skin tear to proximal right lateral thigh/hip area. Tenderness right hip. Tenderness left wrist w bruise/contusion to area. Healing skin tear medial left knee. Otherwise good passive rom bil extremities without pain or other focal bony tenderness. Distal pulses palp bil.   Skin:    General: Skin is warm and dry.     Findings: No rash.  Neurological:     Mental Status: She is alert.     Comments: Alert, speech normal. GCS 15. Motor/sens grossly intact bil.   Psychiatric:        Mood and Affect: Mood normal.     ED Results / Procedures / Treatments   Labs (all labs ordered are listed, but only abnormal results are displayed) Labs Reviewed - No data to display  EKG None  Radiology DG Wrist Complete Left Result Date: 07/28/2023 CLINICAL DATA:  Fall, pain EXAM: LEFT WRIST - COMPLETE 3+ VIEW COMPARISON:  None Available. FINDINGS: Advanced arthritic changes in the left wrist, particularly the 1st carpometacarpal joint. No acute bony abnormality. Specifically, no acute  fracture, subluxation, or dislocation. Old healed 4th metacarpal fracture. IMPRESSION: Advanced arthritic changes throughout the left wrist. No acute bony abnormality. Electronically Signed   By: Janeece Mechanic M.D.   On: 07/28/2023 21:42   DG HIP UNILAT W OR W/O PELVIS 2-3 VIEWS RIGHT Result Date: 07/28/2023 CLINICAL DATA:  Fall, right hip abrasion, pain EXAM: DG HIP (WITH OR WITHOUT PELVIS) 2-3V RIGHT COMPARISON:  None Available. FINDINGS: Prior left hip replacement. No hardware complicating feature. No fracture within the right hip. No subluxation or dislocation. Early joint space narrowing. SI joints symmetric and unremarkable. Degenerative changes in the visualized lower lumbar spine. Soft tissues are intact. IMPRESSION: No acute bony abnormality. Electronically Signed   By: Janeece Mechanic M.D.   On: 07/28/2023 21:40    Procedures Procedures    Medications Ordered in ED Medications - No data to display  ED Course/ Medical Decision Making/ A&P                                 Medical Decision Making Problems Addressed: Contusion of left wrist, initial encounter: acute illness or injury Contusion of right hip, initial encounter: acute illness or injury Fall from slip, trip, or stumble, initial encounter: acute illness or injury with systemic symptoms that poses a threat to life or bodily functions Multiple skin tears: acute illness or injury  Amount and/or Complexity of Data Reviewed Independent Historian: EMS    Details: hx External Data Reviewed: notes. Radiology: ordered and independent interpretation performed. Decision-making details documented in ED Course.  Risk OTC drugs. Decision regarding hospitalization.   Imaging ordered.   Differential diagnosis includes hip fracture, contusion, etc. Dispo decision including potential need for admission considered - will get labs and imaging and reassess.   Reviewed nursing notes and prior charts for additional history. External  reports reviewed. Additional history from: EMS.   Xrays reviewed/interpreted by me - no fracture.   Acetaminophen   po.   Recheck pt comfortable, no distress. No current c/o.   Pt appears stable for ed d/c.   Rec close pcp f/u.  Return precautions provided.          Final Clinical Impression(s) / ED Diagnoses Final diagnoses:  None    Rx / DC Orders ED Discharge Orders     None         Guadalupe Lee, MD 07/28/23 2247

## 2023-07-28 NOTE — ED Notes (Signed)
 [facility] called for transport back [facility]

## 2023-07-28 NOTE — ED Triage Notes (Addendum)
 BIB EMS from [place] for a mechanical fall in the bathroom. Patients has skin tear to the Left knee and thinks she may have hit her head, denies LOC, Denies Blood thinners. Confused at baseline per ems report. Disoriented to time and place.

## 2023-07-28 NOTE — ED Notes (Signed)
 Patient transported to X-ray

## 2023-07-28 NOTE — Discharge Instructions (Signed)
 It was our pleasure to provide your ER care today - we hope that you feel better.  Fall precautions - use great care/caution and assistance before sitting/standing.    Keep skin tears very clean - change dressing 1-2x/day.   Follow up closely with primary care doctor in the coming  week.  Return to ER if worse, new symptoms, fevers, new/severe pain, or other concern.

## 2023-07-29 DIAGNOSIS — Z7401 Bed confinement status: Secondary | ICD-10-CM | POA: Diagnosis not present

## 2023-07-29 DIAGNOSIS — D649 Anemia, unspecified: Secondary | ICD-10-CM | POA: Diagnosis not present

## 2023-07-29 DIAGNOSIS — J84115 Respiratory bronchiolitis interstitial lung disease: Secondary | ICD-10-CM | POA: Diagnosis not present

## 2023-07-29 DIAGNOSIS — J9611 Chronic respiratory failure with hypoxia: Secondary | ICD-10-CM | POA: Diagnosis not present

## 2023-07-29 DIAGNOSIS — N182 Chronic kidney disease, stage 2 (mild): Secondary | ICD-10-CM | POA: Diagnosis not present

## 2023-07-29 DIAGNOSIS — I5040 Unspecified combined systolic (congestive) and diastolic (congestive) heart failure: Secondary | ICD-10-CM | POA: Diagnosis not present

## 2023-07-29 DIAGNOSIS — I10 Essential (primary) hypertension: Secondary | ICD-10-CM | POA: Diagnosis not present

## 2023-07-29 NOTE — ED Notes (Signed)
 Patient has skin tears to the Right hip, Left knee and leg, and Right arm. Wounds cleaned, non adherent xeroform dressing applied, and bandaged with Gause wrap.

## 2023-08-01 DIAGNOSIS — J9611 Chronic respiratory failure with hypoxia: Secondary | ICD-10-CM | POA: Diagnosis not present

## 2023-08-01 DIAGNOSIS — N182 Chronic kidney disease, stage 2 (mild): Secondary | ICD-10-CM | POA: Diagnosis not present

## 2023-08-01 DIAGNOSIS — I10 Essential (primary) hypertension: Secondary | ICD-10-CM | POA: Diagnosis not present

## 2023-08-01 DIAGNOSIS — E871 Hypo-osmolality and hyponatremia: Secondary | ICD-10-CM | POA: Diagnosis not present

## 2023-08-01 DIAGNOSIS — D649 Anemia, unspecified: Secondary | ICD-10-CM | POA: Diagnosis not present

## 2023-08-01 DIAGNOSIS — I509 Heart failure, unspecified: Secondary | ICD-10-CM | POA: Diagnosis not present

## 2023-08-01 DIAGNOSIS — I110 Hypertensive heart disease with heart failure: Secondary | ICD-10-CM | POA: Diagnosis not present

## 2023-08-01 DIAGNOSIS — J84115 Respiratory bronchiolitis interstitial lung disease: Secondary | ICD-10-CM | POA: Diagnosis not present

## 2023-08-01 DIAGNOSIS — R296 Repeated falls: Secondary | ICD-10-CM | POA: Diagnosis not present

## 2023-08-01 DIAGNOSIS — I5040 Unspecified combined systolic (congestive) and diastolic (congestive) heart failure: Secondary | ICD-10-CM | POA: Diagnosis not present

## 2023-08-02 DIAGNOSIS — J84115 Respiratory bronchiolitis interstitial lung disease: Secondary | ICD-10-CM | POA: Diagnosis not present

## 2023-08-02 DIAGNOSIS — D649 Anemia, unspecified: Secondary | ICD-10-CM | POA: Diagnosis not present

## 2023-08-02 DIAGNOSIS — J9611 Chronic respiratory failure with hypoxia: Secondary | ICD-10-CM | POA: Diagnosis not present

## 2023-08-02 DIAGNOSIS — I10 Essential (primary) hypertension: Secondary | ICD-10-CM | POA: Diagnosis not present

## 2023-08-02 DIAGNOSIS — N182 Chronic kidney disease, stage 2 (mild): Secondary | ICD-10-CM | POA: Diagnosis not present

## 2023-08-02 DIAGNOSIS — I5040 Unspecified combined systolic (congestive) and diastolic (congestive) heart failure: Secondary | ICD-10-CM | POA: Diagnosis not present

## 2023-08-03 DIAGNOSIS — K219 Gastro-esophageal reflux disease without esophagitis: Secondary | ICD-10-CM | POA: Diagnosis not present

## 2023-08-03 DIAGNOSIS — D508 Other iron deficiency anemias: Secondary | ICD-10-CM | POA: Diagnosis not present

## 2023-08-03 DIAGNOSIS — M24451 Recurrent dislocation, right hip: Secondary | ICD-10-CM | POA: Diagnosis not present

## 2023-08-03 DIAGNOSIS — J849 Interstitial pulmonary disease, unspecified: Secondary | ICD-10-CM | POA: Diagnosis not present

## 2023-08-03 DIAGNOSIS — I5040 Unspecified combined systolic (congestive) and diastolic (congestive) heart failure: Secondary | ICD-10-CM | POA: Diagnosis not present

## 2023-08-03 DIAGNOSIS — E8809 Other disorders of plasma-protein metabolism, not elsewhere classified: Secondary | ICD-10-CM | POA: Diagnosis not present

## 2023-08-03 DIAGNOSIS — I130 Hypertensive heart and chronic kidney disease with heart failure and stage 1 through stage 4 chronic kidney disease, or unspecified chronic kidney disease: Secondary | ICD-10-CM | POA: Diagnosis not present

## 2023-08-03 DIAGNOSIS — G301 Alzheimer's disease with late onset: Secondary | ICD-10-CM | POA: Diagnosis not present

## 2023-08-03 DIAGNOSIS — E871 Hypo-osmolality and hyponatremia: Secondary | ICD-10-CM | POA: Diagnosis not present

## 2023-08-03 DIAGNOSIS — Z9181 History of falling: Secondary | ICD-10-CM | POA: Diagnosis not present

## 2023-08-04 DIAGNOSIS — I10 Essential (primary) hypertension: Secondary | ICD-10-CM | POA: Diagnosis not present

## 2023-08-04 DIAGNOSIS — J9611 Chronic respiratory failure with hypoxia: Secondary | ICD-10-CM | POA: Diagnosis not present

## 2023-08-04 DIAGNOSIS — N182 Chronic kidney disease, stage 2 (mild): Secondary | ICD-10-CM | POA: Diagnosis not present

## 2023-08-04 DIAGNOSIS — J84115 Respiratory bronchiolitis interstitial lung disease: Secondary | ICD-10-CM | POA: Diagnosis not present

## 2023-08-04 DIAGNOSIS — D649 Anemia, unspecified: Secondary | ICD-10-CM | POA: Diagnosis not present

## 2023-08-04 DIAGNOSIS — I5040 Unspecified combined systolic (congestive) and diastolic (congestive) heart failure: Secondary | ICD-10-CM | POA: Diagnosis not present

## 2023-08-05 DIAGNOSIS — D649 Anemia, unspecified: Secondary | ICD-10-CM | POA: Diagnosis not present

## 2023-08-05 DIAGNOSIS — J9611 Chronic respiratory failure with hypoxia: Secondary | ICD-10-CM | POA: Diagnosis not present

## 2023-08-05 DIAGNOSIS — I5040 Unspecified combined systolic (congestive) and diastolic (congestive) heart failure: Secondary | ICD-10-CM | POA: Diagnosis not present

## 2023-08-05 DIAGNOSIS — N182 Chronic kidney disease, stage 2 (mild): Secondary | ICD-10-CM | POA: Diagnosis not present

## 2023-08-05 DIAGNOSIS — J84115 Respiratory bronchiolitis interstitial lung disease: Secondary | ICD-10-CM | POA: Diagnosis not present

## 2023-08-05 DIAGNOSIS — I10 Essential (primary) hypertension: Secondary | ICD-10-CM | POA: Diagnosis not present

## 2023-08-07 DIAGNOSIS — I10 Essential (primary) hypertension: Secondary | ICD-10-CM | POA: Diagnosis not present

## 2023-08-07 DIAGNOSIS — I5040 Unspecified combined systolic (congestive) and diastolic (congestive) heart failure: Secondary | ICD-10-CM | POA: Diagnosis not present

## 2023-08-07 DIAGNOSIS — N182 Chronic kidney disease, stage 2 (mild): Secondary | ICD-10-CM | POA: Diagnosis not present

## 2023-08-07 DIAGNOSIS — J84115 Respiratory bronchiolitis interstitial lung disease: Secondary | ICD-10-CM | POA: Diagnosis not present

## 2023-08-07 DIAGNOSIS — D649 Anemia, unspecified: Secondary | ICD-10-CM | POA: Diagnosis not present

## 2023-08-07 DIAGNOSIS — J9611 Chronic respiratory failure with hypoxia: Secondary | ICD-10-CM | POA: Diagnosis not present

## 2023-08-08 DIAGNOSIS — I10 Essential (primary) hypertension: Secondary | ICD-10-CM | POA: Diagnosis not present

## 2023-08-08 DIAGNOSIS — J9611 Chronic respiratory failure with hypoxia: Secondary | ICD-10-CM | POA: Diagnosis not present

## 2023-08-08 DIAGNOSIS — J84115 Respiratory bronchiolitis interstitial lung disease: Secondary | ICD-10-CM | POA: Diagnosis not present

## 2023-08-08 DIAGNOSIS — I5040 Unspecified combined systolic (congestive) and diastolic (congestive) heart failure: Secondary | ICD-10-CM | POA: Diagnosis not present

## 2023-08-08 DIAGNOSIS — D649 Anemia, unspecified: Secondary | ICD-10-CM | POA: Diagnosis not present

## 2023-08-08 DIAGNOSIS — N182 Chronic kidney disease, stage 2 (mild): Secondary | ICD-10-CM | POA: Diagnosis not present

## 2023-08-09 DIAGNOSIS — D649 Anemia, unspecified: Secondary | ICD-10-CM | POA: Diagnosis not present

## 2023-08-09 DIAGNOSIS — I10 Essential (primary) hypertension: Secondary | ICD-10-CM | POA: Diagnosis not present

## 2023-08-09 DIAGNOSIS — J9611 Chronic respiratory failure with hypoxia: Secondary | ICD-10-CM | POA: Diagnosis not present

## 2023-08-09 DIAGNOSIS — J84115 Respiratory bronchiolitis interstitial lung disease: Secondary | ICD-10-CM | POA: Diagnosis not present

## 2023-08-09 DIAGNOSIS — I5040 Unspecified combined systolic (congestive) and diastolic (congestive) heart failure: Secondary | ICD-10-CM | POA: Diagnosis not present

## 2023-08-09 DIAGNOSIS — N182 Chronic kidney disease, stage 2 (mild): Secondary | ICD-10-CM | POA: Diagnosis not present

## 2023-08-10 DIAGNOSIS — J9611 Chronic respiratory failure with hypoxia: Secondary | ICD-10-CM | POA: Diagnosis not present

## 2023-08-10 DIAGNOSIS — N182 Chronic kidney disease, stage 2 (mild): Secondary | ICD-10-CM | POA: Diagnosis not present

## 2023-08-10 DIAGNOSIS — I5040 Unspecified combined systolic (congestive) and diastolic (congestive) heart failure: Secondary | ICD-10-CM | POA: Diagnosis not present

## 2023-08-10 DIAGNOSIS — I10 Essential (primary) hypertension: Secondary | ICD-10-CM | POA: Diagnosis not present

## 2023-08-10 DIAGNOSIS — D649 Anemia, unspecified: Secondary | ICD-10-CM | POA: Diagnosis not present

## 2023-08-10 DIAGNOSIS — J84115 Respiratory bronchiolitis interstitial lung disease: Secondary | ICD-10-CM | POA: Diagnosis not present

## 2023-08-11 ENCOUNTER — Encounter: Payer: Self-pay | Admitting: Hematology

## 2023-08-11 DIAGNOSIS — N182 Chronic kidney disease, stage 2 (mild): Secondary | ICD-10-CM | POA: Diagnosis not present

## 2023-08-11 DIAGNOSIS — J84115 Respiratory bronchiolitis interstitial lung disease: Secondary | ICD-10-CM | POA: Diagnosis not present

## 2023-08-11 DIAGNOSIS — I5040 Unspecified combined systolic (congestive) and diastolic (congestive) heart failure: Secondary | ICD-10-CM | POA: Diagnosis not present

## 2023-08-11 DIAGNOSIS — I10 Essential (primary) hypertension: Secondary | ICD-10-CM | POA: Diagnosis not present

## 2023-08-11 DIAGNOSIS — Z131 Encounter for screening for diabetes mellitus: Secondary | ICD-10-CM | POA: Diagnosis not present

## 2023-08-11 DIAGNOSIS — D649 Anemia, unspecified: Secondary | ICD-10-CM | POA: Diagnosis not present

## 2023-08-11 DIAGNOSIS — J9611 Chronic respiratory failure with hypoxia: Secondary | ICD-10-CM | POA: Diagnosis not present

## 2023-08-11 DIAGNOSIS — E785 Hyperlipidemia, unspecified: Secondary | ICD-10-CM | POA: Diagnosis not present

## 2023-08-12 DIAGNOSIS — J9611 Chronic respiratory failure with hypoxia: Secondary | ICD-10-CM | POA: Diagnosis not present

## 2023-08-12 DIAGNOSIS — I5040 Unspecified combined systolic (congestive) and diastolic (congestive) heart failure: Secondary | ICD-10-CM | POA: Diagnosis not present

## 2023-08-12 DIAGNOSIS — I10 Essential (primary) hypertension: Secondary | ICD-10-CM | POA: Diagnosis not present

## 2023-08-12 DIAGNOSIS — J84115 Respiratory bronchiolitis interstitial lung disease: Secondary | ICD-10-CM | POA: Diagnosis not present

## 2023-08-12 DIAGNOSIS — D649 Anemia, unspecified: Secondary | ICD-10-CM | POA: Diagnosis not present

## 2023-08-12 DIAGNOSIS — N182 Chronic kidney disease, stage 2 (mild): Secondary | ICD-10-CM | POA: Diagnosis not present

## 2023-08-14 DIAGNOSIS — F03918 Unspecified dementia, unspecified severity, with other behavioral disturbance: Secondary | ICD-10-CM | POA: Diagnosis not present

## 2023-08-14 DIAGNOSIS — K219 Gastro-esophageal reflux disease without esophagitis: Secondary | ICD-10-CM | POA: Diagnosis not present

## 2023-08-14 DIAGNOSIS — I10 Essential (primary) hypertension: Secondary | ICD-10-CM | POA: Diagnosis not present

## 2023-08-14 DIAGNOSIS — J84115 Respiratory bronchiolitis interstitial lung disease: Secondary | ICD-10-CM | POA: Diagnosis not present

## 2023-08-14 DIAGNOSIS — J9611 Chronic respiratory failure with hypoxia: Secondary | ICD-10-CM | POA: Diagnosis not present

## 2023-08-14 DIAGNOSIS — D649 Anemia, unspecified: Secondary | ICD-10-CM | POA: Diagnosis not present

## 2023-08-14 DIAGNOSIS — E46 Unspecified protein-calorie malnutrition: Secondary | ICD-10-CM | POA: Diagnosis not present

## 2023-08-14 DIAGNOSIS — N182 Chronic kidney disease, stage 2 (mild): Secondary | ICD-10-CM | POA: Diagnosis not present

## 2023-08-14 DIAGNOSIS — I5040 Unspecified combined systolic (congestive) and diastolic (congestive) heart failure: Secondary | ICD-10-CM | POA: Diagnosis not present

## 2023-08-14 DIAGNOSIS — M109 Gout, unspecified: Secondary | ICD-10-CM | POA: Diagnosis not present

## 2023-08-15 DIAGNOSIS — I5040 Unspecified combined systolic (congestive) and diastolic (congestive) heart failure: Secondary | ICD-10-CM | POA: Diagnosis not present

## 2023-08-15 DIAGNOSIS — J84115 Respiratory bronchiolitis interstitial lung disease: Secondary | ICD-10-CM | POA: Diagnosis not present

## 2023-08-15 DIAGNOSIS — I10 Essential (primary) hypertension: Secondary | ICD-10-CM | POA: Diagnosis not present

## 2023-08-15 DIAGNOSIS — D649 Anemia, unspecified: Secondary | ICD-10-CM | POA: Diagnosis not present

## 2023-08-15 DIAGNOSIS — J9611 Chronic respiratory failure with hypoxia: Secondary | ICD-10-CM | POA: Diagnosis not present

## 2023-08-15 DIAGNOSIS — N182 Chronic kidney disease, stage 2 (mild): Secondary | ICD-10-CM | POA: Diagnosis not present

## 2023-08-16 DIAGNOSIS — E785 Hyperlipidemia, unspecified: Secondary | ICD-10-CM | POA: Diagnosis not present

## 2023-08-16 DIAGNOSIS — J9611 Chronic respiratory failure with hypoxia: Secondary | ICD-10-CM | POA: Diagnosis not present

## 2023-08-16 DIAGNOSIS — J84115 Respiratory bronchiolitis interstitial lung disease: Secondary | ICD-10-CM | POA: Diagnosis not present

## 2023-08-16 DIAGNOSIS — I5040 Unspecified combined systolic (congestive) and diastolic (congestive) heart failure: Secondary | ICD-10-CM | POA: Diagnosis not present

## 2023-08-16 DIAGNOSIS — I10 Essential (primary) hypertension: Secondary | ICD-10-CM | POA: Diagnosis not present

## 2023-08-16 DIAGNOSIS — D649 Anemia, unspecified: Secondary | ICD-10-CM | POA: Diagnosis not present

## 2023-08-16 DIAGNOSIS — N182 Chronic kidney disease, stage 2 (mild): Secondary | ICD-10-CM | POA: Diagnosis not present

## 2023-08-18 DIAGNOSIS — B351 Tinea unguium: Secondary | ICD-10-CM | POA: Diagnosis not present

## 2023-08-18 DIAGNOSIS — M2012 Hallux valgus (acquired), left foot: Secondary | ICD-10-CM | POA: Diagnosis not present

## 2023-08-18 DIAGNOSIS — J9611 Chronic respiratory failure with hypoxia: Secondary | ICD-10-CM | POA: Diagnosis not present

## 2023-08-18 DIAGNOSIS — J84115 Respiratory bronchiolitis interstitial lung disease: Secondary | ICD-10-CM | POA: Diagnosis not present

## 2023-08-18 DIAGNOSIS — I10 Essential (primary) hypertension: Secondary | ICD-10-CM | POA: Diagnosis not present

## 2023-08-18 DIAGNOSIS — D649 Anemia, unspecified: Secondary | ICD-10-CM | POA: Diagnosis not present

## 2023-08-18 DIAGNOSIS — M79675 Pain in left toe(s): Secondary | ICD-10-CM | POA: Diagnosis not present

## 2023-08-18 DIAGNOSIS — N182 Chronic kidney disease, stage 2 (mild): Secondary | ICD-10-CM | POA: Diagnosis not present

## 2023-08-18 DIAGNOSIS — I739 Peripheral vascular disease, unspecified: Secondary | ICD-10-CM | POA: Diagnosis not present

## 2023-08-18 DIAGNOSIS — I5040 Unspecified combined systolic (congestive) and diastolic (congestive) heart failure: Secondary | ICD-10-CM | POA: Diagnosis not present

## 2023-08-22 DIAGNOSIS — J9611 Chronic respiratory failure with hypoxia: Secondary | ICD-10-CM | POA: Diagnosis not present

## 2023-08-22 DIAGNOSIS — I5040 Unspecified combined systolic (congestive) and diastolic (congestive) heart failure: Secondary | ICD-10-CM | POA: Diagnosis not present

## 2023-08-22 DIAGNOSIS — I10 Essential (primary) hypertension: Secondary | ICD-10-CM | POA: Diagnosis not present

## 2023-08-22 DIAGNOSIS — N182 Chronic kidney disease, stage 2 (mild): Secondary | ICD-10-CM | POA: Diagnosis not present

## 2023-08-22 DIAGNOSIS — D649 Anemia, unspecified: Secondary | ICD-10-CM | POA: Diagnosis not present

## 2023-08-22 DIAGNOSIS — J84115 Respiratory bronchiolitis interstitial lung disease: Secondary | ICD-10-CM | POA: Diagnosis not present

## 2023-09-13 DEATH — deceased

## 2023-09-15 ENCOUNTER — Ambulatory Visit: Admitting: Podiatry

## 2023-10-03 ENCOUNTER — Inpatient Hospital Stay: Attending: Hematology | Admitting: Hematology

## 2023-10-03 ENCOUNTER — Inpatient Hospital Stay
